# Patient Record
Sex: Female | Born: 1935 | Race: White | Hispanic: No | Smoking: Never smoker
Health system: Southern US, Community
[De-identification: ages and names within clinical notes are randomized; demographics above are authoritative.]

## PROBLEM LIST (undated history)

## (undated) DIAGNOSIS — S32009A Unspecified fracture of unspecified lumbar vertebra, initial encounter for closed fracture: Secondary | ICD-10-CM

## (undated) DIAGNOSIS — S32040A Wedge compression fracture of fourth lumbar vertebra, initial encounter for closed fracture: Secondary | ICD-10-CM

## (undated) DIAGNOSIS — E039 Hypothyroidism, unspecified: Secondary | ICD-10-CM

## (undated) DIAGNOSIS — F419 Anxiety disorder, unspecified: Secondary | ICD-10-CM

## (undated) DIAGNOSIS — C50919 Malignant neoplasm of unspecified site of unspecified female breast: Secondary | ICD-10-CM

## (undated) DIAGNOSIS — M797 Fibromyalgia: Secondary | ICD-10-CM

## (undated) DIAGNOSIS — I341 Nonrheumatic mitral (valve) prolapse: Secondary | ICD-10-CM

## (undated) DIAGNOSIS — E7800 Pure hypercholesterolemia, unspecified: Secondary | ICD-10-CM

## (undated) DIAGNOSIS — I340 Nonrheumatic mitral (valve) insufficiency: Secondary | ICD-10-CM

## (undated) DIAGNOSIS — S22009A Unspecified fracture of unspecified thoracic vertebra, initial encounter for closed fracture: Secondary | ICD-10-CM

## (undated) DIAGNOSIS — S2249XA Multiple fractures of ribs, unspecified side, initial encounter for closed fracture: Secondary | ICD-10-CM

## (undated) DIAGNOSIS — I5032 Chronic diastolic (congestive) heart failure: Secondary | ICD-10-CM

## (undated) DIAGNOSIS — I272 Pulmonary hypertension, unspecified: Secondary | ICD-10-CM

## (undated) DIAGNOSIS — Z86718 Personal history of other venous thrombosis and embolism: Secondary | ICD-10-CM

## (undated) DIAGNOSIS — I7121 Aneurysm of the ascending aorta, without rupture: Secondary | ICD-10-CM

## (undated) DIAGNOSIS — S2239XA Fracture of one rib, unspecified side, initial encounter for closed fracture: Secondary | ICD-10-CM

## (undated) DIAGNOSIS — Z8719 Personal history of other diseases of the digestive system: Secondary | ICD-10-CM

## (undated) DIAGNOSIS — Z923 Personal history of irradiation: Secondary | ICD-10-CM

## (undated) DIAGNOSIS — I82409 Acute embolism and thrombosis of unspecified deep veins of unspecified lower extremity: Secondary | ICD-10-CM

## (undated) DIAGNOSIS — I10 Essential (primary) hypertension: Secondary | ICD-10-CM

## (undated) DIAGNOSIS — K219 Gastro-esophageal reflux disease without esophagitis: Secondary | ICD-10-CM

## (undated) DIAGNOSIS — I071 Rheumatic tricuspid insufficiency: Secondary | ICD-10-CM

## (undated) DIAGNOSIS — I351 Nonrheumatic aortic (valve) insufficiency: Secondary | ICD-10-CM

## (undated) DIAGNOSIS — I480 Paroxysmal atrial fibrillation: Secondary | ICD-10-CM

## (undated) DIAGNOSIS — I712 Thoracic aortic aneurysm, without rupture: Secondary | ICD-10-CM

## (undated) HISTORY — DX: Nonrheumatic aortic (valve) insufficiency: I35.1

## (undated) HISTORY — DX: Thoracic aortic aneurysm, without rupture: I71.2

## (undated) HISTORY — DX: Paroxysmal atrial fibrillation: I48.0

## (undated) HISTORY — PX: BREAST SURGERY: SHX581

## (undated) HISTORY — DX: Nonrheumatic mitral (valve) prolapse: I34.1

## (undated) HISTORY — DX: Personal history of other venous thrombosis and embolism: Z86.718

## (undated) HISTORY — DX: Gastro-esophageal reflux disease without esophagitis: K21.9

## (undated) HISTORY — PX: EYE SURGERY: SHX253

## (undated) HISTORY — DX: Pulmonary hypertension, unspecified: I27.20

## (undated) HISTORY — DX: Nonrheumatic mitral (valve) insufficiency: I34.0

## (undated) HISTORY — DX: Aneurysm of the ascending aorta, without rupture: I71.21

## (undated) HISTORY — DX: Essential (primary) hypertension: I10

## (undated) HISTORY — DX: Malignant neoplasm of unspecified site of unspecified female breast: C50.919

## (undated) HISTORY — DX: Personal history of irradiation: Z92.3

## (undated) HISTORY — DX: Rheumatic tricuspid insufficiency: I07.1

## (undated) HISTORY — DX: Acute embolism and thrombosis of unspecified deep veins of unspecified lower extremity: I82.409

## (undated) HISTORY — DX: Unspecified fracture of unspecified thoracic vertebra, initial encounter for closed fracture: S22.009A

## (undated) HISTORY — DX: Fracture of one rib, unspecified side, initial encounter for closed fracture: S22.39XA

## (undated) HISTORY — DX: Chronic diastolic (congestive) heart failure: I50.32

## (undated) HISTORY — DX: Pure hypercholesterolemia, unspecified: E78.00

## (undated) HISTORY — DX: Unspecified fracture of unspecified lumbar vertebra, initial encounter for closed fracture: S32.009A

## (undated) HISTORY — DX: Multiple fractures of ribs, unspecified side, initial encounter for closed fracture: S22.49XA

---
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
## 1990-11-12 HISTORY — PX: CATARACT EXTRACTION: SUR2

## 1999-05-24 ENCOUNTER — Other Ambulatory Visit: Admission: RE | Admit: 1999-05-24 | Discharge: 1999-05-24 | Payer: Self-pay | Admitting: Gastroenterology

## 2000-01-25 ENCOUNTER — Emergency Department (HOSPITAL_COMMUNITY): Admission: EM | Admit: 2000-01-25 | Discharge: 2000-01-25 | Payer: Self-pay | Admitting: Emergency Medicine

## 2000-01-25 ENCOUNTER — Encounter: Payer: Self-pay | Admitting: Emergency Medicine

## 2001-07-02 ENCOUNTER — Encounter: Payer: Self-pay | Admitting: Orthopaedic Surgery

## 2001-07-02 ENCOUNTER — Encounter: Admission: RE | Admit: 2001-07-02 | Discharge: 2001-07-02 | Payer: Self-pay | Admitting: Orthopaedic Surgery

## 2001-07-31 ENCOUNTER — Encounter: Admission: RE | Admit: 2001-07-31 | Discharge: 2001-07-31 | Payer: Self-pay | Admitting: Urology

## 2001-07-31 ENCOUNTER — Encounter: Payer: Self-pay | Admitting: Urology

## 2001-10-17 ENCOUNTER — Ambulatory Visit (HOSPITAL_COMMUNITY): Admission: RE | Admit: 2001-10-17 | Discharge: 2001-10-17 | Payer: Self-pay | Admitting: Gastroenterology

## 2001-11-11 ENCOUNTER — Encounter: Admission: RE | Admit: 2001-11-11 | Discharge: 2001-11-11 | Payer: Self-pay | Admitting: Internal Medicine

## 2001-11-11 ENCOUNTER — Encounter: Payer: Self-pay | Admitting: Internal Medicine

## 2001-12-15 ENCOUNTER — Ambulatory Visit (HOSPITAL_COMMUNITY): Admission: RE | Admit: 2001-12-15 | Discharge: 2001-12-15 | Payer: Self-pay | Admitting: Gastroenterology

## 2003-09-09 ENCOUNTER — Other Ambulatory Visit: Admission: RE | Admit: 2003-09-09 | Discharge: 2003-09-09 | Payer: Self-pay | Admitting: Gastroenterology

## 2003-12-28 ENCOUNTER — Encounter: Admission: RE | Admit: 2003-12-28 | Discharge: 2003-12-28 | Payer: Self-pay | Admitting: Gastroenterology

## 2004-03-27 ENCOUNTER — Encounter: Admission: RE | Admit: 2004-03-27 | Discharge: 2004-03-27 | Payer: Self-pay | Admitting: Orthopaedic Surgery

## 2004-04-13 ENCOUNTER — Encounter: Admission: RE | Admit: 2004-04-13 | Discharge: 2004-04-13 | Payer: Self-pay | Admitting: Orthopaedic Surgery

## 2004-04-25 ENCOUNTER — Encounter: Admission: RE | Admit: 2004-04-25 | Discharge: 2004-04-25 | Payer: Self-pay | Admitting: Orthopaedic Surgery

## 2004-05-09 ENCOUNTER — Encounter: Admission: RE | Admit: 2004-05-09 | Discharge: 2004-05-09 | Payer: Self-pay | Admitting: Orthopaedic Surgery

## 2004-09-15 ENCOUNTER — Encounter: Admission: RE | Admit: 2004-09-15 | Discharge: 2004-09-15 | Payer: Self-pay | Admitting: Neurosurgery

## 2004-09-18 ENCOUNTER — Encounter: Admission: RE | Admit: 2004-09-18 | Discharge: 2004-09-18 | Payer: Self-pay | Admitting: Neurosurgery

## 2005-01-01 ENCOUNTER — Encounter: Admission: RE | Admit: 2005-01-01 | Discharge: 2005-01-01 | Payer: Self-pay | Admitting: Neurosurgery

## 2005-01-30 ENCOUNTER — Encounter: Admission: RE | Admit: 2005-01-30 | Discharge: 2005-01-30 | Payer: Self-pay | Admitting: Neurosurgery

## 2005-03-01 ENCOUNTER — Encounter: Admission: RE | Admit: 2005-03-01 | Discharge: 2005-03-01 | Payer: Self-pay | Admitting: Neurosurgery

## 2005-06-20 ENCOUNTER — Encounter: Admission: RE | Admit: 2005-06-20 | Discharge: 2005-06-20 | Payer: Self-pay | Admitting: Neurosurgery

## 2005-07-18 ENCOUNTER — Encounter: Admission: RE | Admit: 2005-07-18 | Discharge: 2005-07-18 | Payer: Self-pay | Admitting: Neurosurgery

## 2005-08-05 ENCOUNTER — Emergency Department (HOSPITAL_COMMUNITY): Admission: EM | Admit: 2005-08-05 | Discharge: 2005-08-05 | Payer: Self-pay | Admitting: *Deleted

## 2005-08-10 ENCOUNTER — Encounter: Admission: RE | Admit: 2005-08-10 | Discharge: 2005-08-10 | Payer: Self-pay | Admitting: Gastroenterology

## 2005-09-10 ENCOUNTER — Encounter: Admission: RE | Admit: 2005-09-10 | Discharge: 2005-09-10 | Payer: Self-pay | Admitting: Neurosurgery

## 2005-09-26 ENCOUNTER — Other Ambulatory Visit: Admission: RE | Admit: 2005-09-26 | Discharge: 2005-09-26 | Payer: Self-pay | Admitting: Gastroenterology

## 2005-10-08 ENCOUNTER — Encounter: Admission: RE | Admit: 2005-10-08 | Discharge: 2005-10-08 | Payer: Self-pay | Admitting: Neurosurgery

## 2005-12-13 ENCOUNTER — Encounter: Admission: RE | Admit: 2005-12-13 | Discharge: 2005-12-13 | Payer: Self-pay | Admitting: Neurosurgery

## 2006-02-11 ENCOUNTER — Encounter: Admission: RE | Admit: 2006-02-11 | Discharge: 2006-02-11 | Payer: Self-pay | Admitting: Neurosurgery

## 2006-03-05 ENCOUNTER — Encounter: Admission: RE | Admit: 2006-03-05 | Discharge: 2006-03-05 | Payer: Self-pay | Admitting: Neurosurgery

## 2006-10-16 ENCOUNTER — Ambulatory Visit (HOSPITAL_BASED_OUTPATIENT_CLINIC_OR_DEPARTMENT_OTHER): Admission: RE | Admit: 2006-10-16 | Discharge: 2006-10-16 | Payer: Self-pay | Admitting: Surgery

## 2006-10-16 HISTORY — PX: HERNIA REPAIR: SHX51

## 2007-10-27 ENCOUNTER — Other Ambulatory Visit: Admission: RE | Admit: 2007-10-27 | Discharge: 2007-10-27 | Payer: Self-pay | Admitting: Gastroenterology

## 2008-01-16 ENCOUNTER — Encounter: Admission: RE | Admit: 2008-01-16 | Discharge: 2008-01-16 | Payer: Self-pay | Admitting: Rheumatology

## 2010-01-16 ENCOUNTER — Encounter: Admission: RE | Admit: 2010-01-16 | Discharge: 2010-01-16 | Payer: Self-pay | Admitting: Internal Medicine

## 2010-12-01 ENCOUNTER — Inpatient Hospital Stay (HOSPITAL_COMMUNITY)
Admission: EM | Admit: 2010-12-01 | Discharge: 2010-12-02 | Payer: Self-pay | Source: Home / Self Care | Attending: Interventional Cardiology | Admitting: Interventional Cardiology

## 2010-12-03 ENCOUNTER — Encounter: Payer: Self-pay | Admitting: Rheumatology

## 2010-12-03 NOTE — Discharge Summary (Signed)
[name], [name] NO.:  [id number]  MEDICAL RECORD NO.:  [id number]          PATIENT TYPE:  INP  LOCATION:  2002                         FACILITY:  [facility]  PHYSICIAN:  Annette Bathe, MD      DATE OF BIRTH:  December 15, 1935  DATE OF ADMISSION:  12/01/2010 DATE OF DISCHARGE:  12/02/2010                              DISCHARGE SUMMARY   PRIMARY CARE PHYSICIAN:  Annette Arab. Valentina Lucks, MD  FINAL DIAGNOSES: 1. Chest pain - noncardiac - nuclear stress test performed was low     risk showing no evidence of ischemia with normal ejection fraction     63%. 2. Hypertension - blood pressure 134/90 down to 194/60 in the middle     of night - she was on metoprolol 25 q.6 during this hospitalization     and this has been discontinued.  Her a.m. EKG also showed resting     bradycardia of 45.  She does not need beta-blocker therapy. 3. Hyperlipidemia - continue with Pravachol as previously prescribed. 4. Hypothyroidism, on Synthroid. 5. Osteoporosis. 6. Gastroesophageal reflux disease - on Prilosec 40 mg tablet every     morning at home - one may wish to intensify this with Protonix or     other agent. 7. Fibromyalgia. 8. Remote history of deep vein thrombosis.  ALLERGIES:  IV DYE cause shortness of breath; however, the patient is able to tolerate IV contrast when she is premedicated.  PENICILLIN.  MEDICATIONS AT HOME:  She is taking: 1. Norvasc 10 mg a day. 2. Synthroid 100 mcg a day. 3. Prilosec over the counter 40 mg a day. 4. Pravachol 40 mg a day. 5. Enablex 15 mg a day. 6. Amitriptyline 50 mg at bedtime. 7. Vicodin p.r.n. 8. Multivitamins. 9. Citrucel plus D. 10.Boniva 150 mg monthly. 11.I have added aspirin 81 mg to her regimen.  BRIEF HOSPITAL COURSE:  A 75 year old female that was admitted under Dr. Michaelle [name] with chest discomfort for 3-4 weeks history of episodic chest pain randomly predominantly at rest when she is lying down in bed.  Dr. Maurice [name] felt as  though this was consistent with reflux, started her on low-dose proton pump inhibitor.  Symptoms improved, but then she went back, and her proton pump inhibitor was titrated.  EKG was normal. On the morning of discharge, she did have resting bradycardia, rate 45, however, this was with concomitant use of metoprolol.  On admit, she did have a resting bradycardia of 55 with no ST segment changes.  She was taken down to the nuclear stress lab for stress test, and this was low risk showing no evidence of ischemia with normal EF.  This was a Lexiscan stress.  On the day of discharge, she was feeling well.  No complaints.  No shortness of breath, ambulating well.  Blood pressures as noted above.  Afebrile.  NOTABLE LABORATORY DATA:  Lipid profile, total cholesterol 149, triglycerides 54, HDL 58, LDL 80.  Sodium 140, potassium 3.6, BUN 24, creatinine 1.1, hemoglobin A1c 5.4.  TSH was normal at 1.6.  Cardiac biomarkers were all normal.  FOLLOWUP:  She does not require  Cardiology followup.  I have instructed her to make an appointment once again with Dr. Maurice [name].  We will continue for treatment with GERD, possibly intensifying her proton pump inhibitor to Protonix or a new agent, but I will leave this at the discretion of Dr. Valentina [name].  Both she and her husband were satisfied with this, and we discussed her hospital course and plan as well as medication strategy.  35 minutes was spent on dictation, med reconciliation, review of medications, discussion with the patient.     Annette Bathe, MD     MCS/MEDQ  D:  12/02/2010  T:  12/03/2010  Job:  [id number]  Electronically Signed by Donato Schultz MD on 12/03/2010 09:47:32 PM

## 2010-12-04 LAB — POCT I-STAT, CHEM 8
Calcium, Ion: 1.17 mmol/L (ref 1.12–1.32)
Chloride: 106 mEq/L (ref 96–112)
HCT: 43 % (ref 36.0–46.0)

## 2010-12-04 LAB — CBC
MCV: 83.7 fL (ref 78.0–100.0)
Platelets: 173 10*3/uL (ref 150–400)
RDW: 13.9 % (ref 11.5–15.5)
WBC: 6.5 10*3/uL (ref 4.0–10.5)

## 2010-12-04 LAB — COMPREHENSIVE METABOLIC PANEL
ALT: 20 U/L (ref 0–35)
AST: 24 U/L (ref 0–37)
Alkaline Phosphatase: 52 U/L (ref 39–117)
CO2: 27 mEq/L (ref 19–32)
Chloride: 108 mEq/L (ref 96–112)
Creatinine, Ser: 0.74 mg/dL (ref 0.4–1.2)
GFR calc Af Amer: >60 mL/min (ref >60)
GFR calc non Af Amer: >60 mL/min (ref >60)
Potassium: 3.2 mEq/L — ABNORMAL LOW (ref 3.5–5.1)
Sodium: 140 mEq/L (ref 135–145)
Total Bilirubin: 0.6 mg/dL (ref 0.3–1.2)

## 2010-12-04 LAB — CK TOTAL AND CKMB (NOT AT ARMC)
CK, MB: 4.2 ng/mL — ABNORMAL HIGH (ref 0.3–4.0)
Relative Index: INVALID (ref 0.0–2.5)
Total CK: 96 U/L (ref 7–177)

## 2010-12-04 LAB — HEMOGLOBIN A1C
Hgb A1c MFr Bld: 5.4 % (ref <5.7)
Mean Plasma Glucose: 108 mg/dL (ref <117)

## 2010-12-04 LAB — TROPONIN I: Troponin I: 0.03 ng/mL (ref 0.00–0.06)

## 2010-12-04 LAB — CARDIAC PANEL(CRET KIN+CKTOT+MB+TROPI)
CK, MB: 4 ng/mL (ref 0.3–4.0)
Relative Index: 3.8 — ABNORMAL HIGH (ref 0.0–2.5)
Relative Index: 3.1 — ABNORMAL HIGH (ref 0.0–2.5)
Total CK: 105 U/L (ref 7–177)
Troponin I: 0.02 ng/mL (ref 0.00–0.06)

## 2010-12-04 LAB — POCT CARDIAC MARKERS: Troponin i, poc: <0.05 ng/mL (ref 0.00–0.09)

## 2010-12-05 LAB — BASIC METABOLIC PANEL
BUN: 24 mg/dL — ABNORMAL HIGH (ref 6–23)
Chloride: 106 mEq/L (ref 96–112)
GFR calc non Af Amer: 45 mL/min — ABNORMAL LOW (ref >60)
Potassium: 3.6 mEq/L (ref 3.5–5.1)
Sodium: 140 mEq/L (ref 135–145)

## 2010-12-05 LAB — LIPID PANEL
Cholesterol: 149 mg/dL (ref 0–200)
HDL: 58 mg/dL (ref >39)
Total CHOL/HDL Ratio: 2.6 RATIO
Triglycerides: 54 mg/dL (ref <150)

## 2010-12-05 LAB — CBC
Hemoglobin: 12.8 g/dL (ref 12.0–15.0)
MCH: 27.9 pg (ref 26.0–34.0)
MCV: 82.1 fL (ref 78.0–100.0)
RBC: 4.59 MIL/uL (ref 3.87–5.11)
WBC: 8.7 10*3/uL (ref 4.0–10.5)

## 2010-12-05 LAB — HEPARIN LEVEL (UNFRACTIONATED): Heparin Unfractionated: 0.45 IU/mL (ref 0.30–0.70)

## 2010-12-07 NOTE — H&P (Addendum)
NAMEMarland Kitchen  [name], [name] NO.:  [id number]  MEDICAL RECORD NO.:  [id number]          PATIENT TYPE:  EMS  LOCATION:  MAJO                         FACILITY:  [facility]  PHYSICIAN:  Therisa Doyne, MD    DATE OF BIRTH:  Jul 19, 1936  DATE OF ADMISSION:  12/01/2010 DATE OF DISCHARGE:                             HISTORY & PHYSICAL   PRIMARY CARE PROVIDER:  Gretta Arab. Valentina Lucks, M.D.  PRIMARY CARDIOLOGIST:  None, however, patient has had an echocardiogram performed by Dr. Verdis Prime.  CHIEF COMPLAINT:  Chest pain.  HISTORY OF PRESENT ILLNESS:  This is a 75 year old white female with past medical history significant for hypertension and hyperlipidemia who presents for evaluation of chest pain.  Historically, the patient reports a 3- to 4-week history of episodic chest pain that occurs at random times and predominantly occurs at rest when she is lying down in bed.  She initially saw her primary care provider who felt that this was consistent with reflux and started her on low-dose proton pump inhibitor.  Her symptoms then improved, and so she went back and her proton pump inhibitor was titrated.  She had an EKG performed which was reportedly abnormal; however, I do not have access to this EKG, and she was sent for an echocardiogram.  This echocardiogram was performed by Dr. Michaelle Copas office last Monday and the results are pending.  Since being placed on the increased dose of proton pump inhibitor, she has done well with minimal chest pain until this evening when she developed 30 minutes of substernal chest pressure and tightness.  This radiated to her bilateral arms, her neck and her jaw.  It lasted 30 minutes, and she took two of her husband's nitroglycerin and this relieved the pain.  She is fairly active since she is doing activities of daily living as well as shopping without any significant exertional chest pain or shortness of breath; however, she does tell me that  she tires out more easily.  She denies any tachy-palpitations or syncope. She denies any frank heart failure symptoms.  In the emergency department, patient received IV heparin and aspirin.  PAST MEDICAL HISTORY: 1. Hypertension. 2. Hyperlipidemia. 3. Hypothyroidism. 4. Gastroesophageal reflux disease. 5. Fibromyalgia. 6. Remote history of deep vein thrombosis.  SOCIAL HISTORY:  The patient lives at home with her husband.  She denies any tobacco, alcohol or drug use.  FAMILY HISTORY:  Negative for premature coronary artery disease.  ALLERGIES: 1. IV DYE which causes shortness of breath; however, the patient is     able to tolerate IV contrast dye when she is premedicated. 2. PENICILLIN.  MEDICATIONS: 1. Norvasc 10 mg daily. 2. Synthroid 100 mcg daily. 3. Prilosec OTC 40 mg daily. 4. Pravachol 40 mg daily. 5. Enablex 15 mg daily. 6. Amitriptyline 50 mg at bedtime.  REVIEW OF SYSTEMS:  All systems were reviewed and are negative except as mentioned above in history of present illness.  PHYSICAL EXAMINATION:  VITAL SIGNS:  Temperature afebrile, blood pressure is 144/96, pulse 95, respirations 18, oxygen saturation 100% on room air. GENERAL:  No acute distress. HEENT:  Normocephalic, atraumatic.  Pupils  equally round and reactive to light and accommodation.  Extraocular movements are intact.  Oropharynx is pink and moist without lesions. NECK:  Supple without lymphadenopathy.  No jugular venous distention. No masses. CARDIOVASCULAR:  Regular rate and rhythm with no murmurs, rubs, or gallops. CHEST:  Clear to auscultation bilaterally. ABDOMEN:  Positive bowel sounds, soft, nontender, and nondistended. EXTREMITIES:  No clubbing, cyanosis, or edema.  Dorsalis pedis pulses are 2+ bilaterally. SKIN:  No rashes. BACK:  No CVA tenderness. NEUROLOGIC:  Nonfocal.  LABORATORY DATA:  Notable for negative troponins, less than 0.005.  CBC is in normal limits.  CMP shows normal  liver function studies, normal renal function studies, hypokalemia with a potassium of 3.2.  Chest x-ray:  No acute cardiopulmonary disease.  EKG shows sinus rhythm with premature ventricular contractions, evidence of a prior anterior septal myocardial infarction age indeterminate, left anterior fascicular block, and no acute ST- or T-wave changes.  No ST elevation, T-wave inversions, or ST depressions.  ASSESSMENT/PLAN:  [name] is a 75 year old white female with a past medical history significant for hypertension and hyperlipidemia who presents for progressive chest pain over the past few weeks with negative cardiac biomarkers. 1. We will admit the patient to [facility], Dr. Michaelle Copas care. 2. Chest pain at this time.  I think that her presentation is somewhat     atypical.  It is unusual that her chest pain predominantly occurs     at rest and does not occur with exertion.  Her EKG did not show any     acute ischemia.  Her first set of cardiac enzymes are negative.     While this could be consistent with unstable angina, I think it is     more likely gastroesophageal reflux disease versus musculoskeletal     pains.  We will treat the patient with aspirin, Lopressor, and     Lipitor. We will start her on IV heparin drip. We will rule out for     myocardial infarction with serial cardiac enzymes, if she rules     out, then I think she would be a reasonable candidate for a stress     test.  I had a long discussion with her regarding the pros and cons     of stress testing versus cardiac catheterization.  At this time,     she elected to go a noninvasive route if possible.  We will     continue to treat her with proton pump inhibitor. 3. Hypertension, under reasonable control.  Continue home medications. 4. Hyperlipidemia.  Continue Pravachol, check fasting lipid profile. 5. Hypothyroidism.  Continue Synthroid, check TSH. 6. Fluids, electrolytes, nutrition.  Saline lock IV fluids.   The     patient is hypokalemic.  We will give her 40 mEq of oral potassium. 7. Deep vein thrombosis prophylaxis is not indicated as the patient is     on a heparin drip.     Therisa Doyne, MD     SJT/MEDQ  D:  12/01/2010  T:  12/01/2010  Job:  [id number]  Electronically Signed by Aldona Bar MD on 12/07/2010 08:35:56 PM

## 2011-03-30 NOTE — Op Note (Signed)
NAMEANAVEY, COOMBES             ACCOUNT NO.:  [id number]   MEDICAL RECORD NO.:  [id number]          PATIENT TYPE:  AMB   LOCATION:  DSC                          FACILITY:  [facility]   PHYSICIAN:  Thornton Park. Daphine Deutscher, MD  DATE OF BIRTH:  1936-02-28   DATE OF PROCEDURE:  10/16/2006  DATE OF DISCHARGE:                               OPERATIVE REPORT   [facility] [phone]   PREOPERATIVE DIAGNOSIS:  Right inguinal hernia.   POSTOPERATIVE DIAGNOSIS:  Right indirect inguinal hernia.   PROCEDURE:  Inversion of sac, transection of the gubernaculum, closure  of the internal ring and repair of the floor with a piece of mesh.   SURGEON:  Luretha Murphy, MD   ANESTHESIA:  General by LMA.   DESCRIPTION OF PROCEDURE:  This 75 year old lady was taken to room 4 at  [facility] and given general anesthesia by LMA.  The abdomen was  prepped with Techni-Care and draped sterilely.  Made a small oblique  incision in the right inguinal region which we had previously marked and  identified, carried this down and found the fat protruding through the  external ring.  I opened the external ring and mobilized this up to the  internal ring which I then inverted the long sac and I closed that with  a running suture of 2-0 Prolene.  Gubernaculum was adjacent to that  and  I went ahead and dissected and divided that and oversewed it with 2-0  silk on either ends.  I then repaired the floor trying to exclude the  nerves with interrupted 2-0 Prolene suture at the inguinal ligament and  to transversalis fascia and up beyond the internal ring.  This was then  tucked beneath, the external oblique was then closed over with a running  2-0 Vicryl.  4-0 Vicryl used in the subcutaneous tissue as well as  interrupted 4-0 and 5-0 Vicryl with Benzoin Steri-Strips.  The patient  seemed tolerate the procedure well and was taken to recovery room in  satisfactory condition.      Thornton Park Daphine Deutscher, MD  Electronically  Signed     MBM/MEDQ  D:  10/16/2006  T:  10/16/2006  Job:  [id number]   cc:   Tasia Catchings, M.D.

## 2011-03-30 NOTE — Op Note (Signed)
NAMEPHUNG, KOTAS             ACCOUNT NO.:  [id number]   MEDICAL RECORD NO.:  [id number]          PATIENT TYPE:  AMB   LOCATION:  DSC                          FACILITY:  [facility]   PHYSICIAN:  Thornton Park. Daphine Deutscher, MD  DATE OF BIRTH:  [birth year]   DATE OF PROCEDURE:  10/16/2006  DATE OF DISCHARGE:  10/16/2006                               OPERATIVE REPORT   PREOPERATIVE DIAGNOSIS:  Right inguinal hernia.   POSTOPERATIVE DIAGNOSIS:  Right inguinal hernia indirect.   DESCRIPTION OF PROCEDURE:  The patient was taken to OR [facility]  and under general with LMA, the right inguinal region was prepped with  Betadine and draped sterilely.  The oblique incision was made and  indirect hernia found.  The sac was amputated and the floor closed and  mesh was applied on top of this.  This was a type of Atrium mesh.  The  wound was then closed in layers.  The patient tolerated procedure well  and was taken to recovery room satisfactory condition.      Thornton Park Daphine Deutscher, MD  Electronically Signed     MBM/MEDQ  D:  11/19/2006  T:  11/20/2006  Job:  [id number]

## 2011-06-07 ENCOUNTER — Other Ambulatory Visit: Payer: Self-pay | Admitting: Internal Medicine

## 2011-06-07 DIAGNOSIS — R1012 Left upper quadrant pain: Secondary | ICD-10-CM

## 2011-06-08 ENCOUNTER — Ambulatory Visit (HOSPITAL_COMMUNITY): Payer: Self-pay

## 2011-06-08 ENCOUNTER — Ambulatory Visit (HOSPITAL_COMMUNITY)
Admission: RE | Admit: 2011-06-08 | Discharge: 2011-06-08 | Disposition: A | Discharge status: Home / Self Care | Payer: Medicare Other | Source: Ambulatory Visit | Attending: Internal Medicine | Admitting: Internal Medicine

## 2011-06-08 ENCOUNTER — Other Ambulatory Visit (HOSPITAL_COMMUNITY): Payer: Self-pay | Admitting: Internal Medicine

## 2011-06-08 ENCOUNTER — Other Ambulatory Visit: Payer: Self-pay

## 2011-06-08 DIAGNOSIS — R1012 Left upper quadrant pain: Secondary | ICD-10-CM

## 2011-06-12 ENCOUNTER — Ambulatory Visit
Admission: RE | Admit: 2011-06-12 | Discharge: 2011-06-12 | Disposition: A | Discharge status: Home / Self Care | Payer: BC Managed Care – PPO | Source: Ambulatory Visit | Attending: Internal Medicine | Admitting: Internal Medicine

## 2011-06-12 ENCOUNTER — Other Ambulatory Visit: Payer: Self-pay | Admitting: Internal Medicine

## 2011-06-12 DIAGNOSIS — M545 Low back pain: Secondary | ICD-10-CM

## 2011-06-13 ENCOUNTER — Other Ambulatory Visit: Payer: Self-pay | Admitting: Internal Medicine

## 2011-06-13 ENCOUNTER — Ambulatory Visit
Admission: RE | Admit: 2011-06-13 | Discharge: 2011-06-13 | Disposition: A | Discharge status: Home / Self Care | Payer: BC Managed Care – PPO | Source: Ambulatory Visit | Attending: Internal Medicine | Admitting: Internal Medicine

## 2011-06-13 DIAGNOSIS — M545 Low back pain: Secondary | ICD-10-CM

## 2011-06-27 ENCOUNTER — Emergency Department (HOSPITAL_COMMUNITY)
Admission: EM | Admit: 2011-06-27 | Discharge: 2011-06-27 | Disposition: A | Discharge status: Home / Self Care | Payer: Medicare Other | Attending: Emergency Medicine | Admitting: Emergency Medicine

## 2011-06-27 ENCOUNTER — Encounter (INDEPENDENT_AMBULATORY_CARE_PROVIDER_SITE_OTHER): Payer: Medicare Other

## 2011-06-27 DIAGNOSIS — E876 Hypokalemia: Secondary | ICD-10-CM | POA: Insufficient documentation

## 2011-06-27 DIAGNOSIS — Z87898 Personal history of other specified conditions: Secondary | ICD-10-CM | POA: Insufficient documentation

## 2011-06-27 DIAGNOSIS — I824Z9 Acute embolism and thrombosis of unspecified deep veins of unspecified distal lower extremity: Secondary | ICD-10-CM | POA: Insufficient documentation

## 2011-06-27 DIAGNOSIS — M7989 Other specified soft tissue disorders: Secondary | ICD-10-CM

## 2011-06-27 DIAGNOSIS — M79609 Pain in unspecified limb: Secondary | ICD-10-CM | POA: Insufficient documentation

## 2011-06-27 DIAGNOSIS — Z79899 Other long term (current) drug therapy: Secondary | ICD-10-CM | POA: Insufficient documentation

## 2011-06-27 DIAGNOSIS — H05409 Unspecified enophthalmos, unspecified eye: Secondary | ICD-10-CM | POA: Insufficient documentation

## 2011-06-27 DIAGNOSIS — I10 Essential (primary) hypertension: Secondary | ICD-10-CM | POA: Insufficient documentation

## 2011-06-27 DIAGNOSIS — E039 Hypothyroidism, unspecified: Secondary | ICD-10-CM | POA: Insufficient documentation

## 2011-06-27 LAB — CBC
MCH: 29.4 pg (ref 26.0–34.0)
MCHC: 35.5 g/dL (ref 30.0–36.0)
MCV: 82.7 fL (ref 78.0–100.0)
Platelets: 214 10*3/uL (ref 150–400)

## 2011-06-27 LAB — POCT I-STAT, CHEM 8
Calcium, Ion: 1.19 mmol/L (ref 1.12–1.32)
Hemoglobin: 13.6 g/dL (ref 12.0–15.0)
Sodium: 140 mEq/L (ref 135–145)
TCO2: 29 mmol/L (ref 0–100)

## 2011-06-27 LAB — PROTIME-INR
INR: 1.03 (ref 0.00–1.49)
Prothrombin Time: 13.7 seconds (ref 11.6–15.2)

## 2011-07-06 NOTE — Procedures (Unsigned)
DUPLEX DEEP VENOUS EXAM - LOWER EXTREMITY  INDICATION:  Left lower extremity swelling and redness for 1 day.  HISTORY:  Edema:  Yes. Trauma/Surgery:  Back fracture. Pain:  No. PE:  No. Previous DVT:  Left leg in 1995. Anticoagulants:  Not currently. Other:  DUPLEX EXAM:               CFV   SFV   PopV  PTV    GSV               R  L  R  L  R  L  R   L  R  L Thrombosis    o  o     o     +      +     o Spontaneous   +  +     +     o      o     + Phasic        +  +     +     o      o     + Augmentation  +  +     +     o      o     + Compressible  +  +     +     o      o     + Competent     +  o     +                  o  Legend:  + - yes  o - no  p - partial  D - decreased  IMPRESSION: 1. Acute mobile deep venous thrombosis observed in the left popliteal     vein.  One of the paired posterior tibial veins appears occluded. 2. There is reflux observed throughout the left lower extremity.  Verbal results were given to Dr. Venetia Maxon at 3:50 p.m.  Patient was instructed to go to the [facility] ER.   _____________________________ Quita Skye. Hart Rochester, M.D.  LT/MEDQ  D:  06/27/2011  T:  06/27/2011  Job:  [id number]

## 2011-08-13 DIAGNOSIS — I82409 Acute embolism and thrombosis of unspecified deep veins of unspecified lower extremity: Secondary | ICD-10-CM

## 2011-08-13 HISTORY — DX: Acute embolism and thrombosis of unspecified deep veins of unspecified lower extremity: I82.409

## 2011-08-20 ENCOUNTER — Other Ambulatory Visit: Payer: Self-pay | Admitting: Radiology

## 2011-08-20 DIAGNOSIS — C50919 Malignant neoplasm of unspecified site of unspecified female breast: Secondary | ICD-10-CM | POA: Insufficient documentation

## 2011-08-20 HISTORY — DX: Malignant neoplasm of unspecified site of unspecified female breast: C50.919

## 2011-08-22 ENCOUNTER — Other Ambulatory Visit: Payer: Self-pay | Admitting: Radiology

## 2011-08-22 DIAGNOSIS — C50911 Malignant neoplasm of unspecified site of right female breast: Secondary | ICD-10-CM

## 2011-08-23 ENCOUNTER — Other Ambulatory Visit: Payer: Self-pay | Admitting: Diagnostic Radiology

## 2011-08-23 MED ORDER — PREDNISONE 50 MG PO TABS: 50.0000 mg | ORAL_TABLET | ORAL | Status: DC

## 2011-08-27 ENCOUNTER — Ambulatory Visit
Admission: RE | Admit: 2011-08-27 | Discharge: 2011-08-27 | Disposition: A | Discharge status: Home / Self Care | Payer: Medicare Other | Source: Ambulatory Visit | Attending: Radiology | Admitting: Radiology

## 2011-08-27 DIAGNOSIS — C50911 Malignant neoplasm of unspecified site of right female breast: Secondary | ICD-10-CM

## 2011-08-27 MED ORDER — GADOBENATE DIMEGLUMINE 529 MG/ML IV SOLN
12.0000 mL | Freq: Once | INTRAVENOUS | Status: AC | PRN
  Administered 2011-08-27: 12 mL via INTRAVENOUS

## 2011-08-27 NOTE — Progress Notes (Signed)
Patient states she has been pre-medicated for contrast allergy despite this being an MRI.  jkl

## 2011-09-07 ENCOUNTER — Encounter (INDEPENDENT_AMBULATORY_CARE_PROVIDER_SITE_OTHER): Payer: Self-pay | Admitting: Surgery

## 2011-09-07 ENCOUNTER — Ambulatory Visit (INDEPENDENT_AMBULATORY_CARE_PROVIDER_SITE_OTHER): Payer: Medicare Other | Admitting: Surgery

## 2011-09-07 VITALS — BP 134/86 | HR 72 | Temp 98.5°F | Resp 16 | Ht 60.0 in | Wt 123.0 lb

## 2011-09-07 DIAGNOSIS — I712 Thoracic aortic aneurysm, without rupture, unspecified: Secondary | ICD-10-CM

## 2011-09-07 DIAGNOSIS — C50919 Malignant neoplasm of unspecified site of unspecified female breast: Secondary | ICD-10-CM

## 2011-09-07 DIAGNOSIS — I82409 Acute embolism and thrombosis of unspecified deep veins of unspecified lower extremity: Secondary | ICD-10-CM

## 2011-09-07 NOTE — Patient Instructions (Signed)
We will schedule surgery when we have heard back from Dr Valentina Lucks. You will need to stay in the hospital overnight.

## 2011-09-07 NOTE — Progress Notes (Addendum)
Chief Complaint  Patient presents with  . breast cancer    HPI [name] is a 75 y.o. female.  She recently had a mammogram which showed a 1 cm area of calcifications are moderately suspicious. 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 There was inadequate tissue for hormone receptor tests. A mammogram has shown only a single area, but it appears about 4 cm of abnormality.  Other than a few this, the patient has never had any prior breast problems. She was asymptomatic and had not noted any thing at the time of her mammogram she has a negative family history for breast cancer.After she left the office we found our old chart on her with a paper record. Dating back to 1982 she has been followed intermittently in our office for fibrocystic changes. I have asked she seen her for this over several years and she been seen by one of my partners prior to that. She was last seen for that here in 2007. HPI  Past Medical History  Diagnosis Date  . GERD (gastroesophageal reflux disease)   . Hypercholesteremia   . Hypertension   . Osteoporosis   . Thyroid disease     Past Surgical History  Procedure Date  . Hernia repair 10/16/2006    [facility] - Dr Daphine Deutscher  . Appendectomy 1940  . Tonsillectomy 1944  . Eye surgery 1940, 1956  . Cataract extraction 1992    No family history on file.  Social History History  Substance Use Topics  . Smoking status: Never Smoker   . Smokeless tobacco: Not on file  . Alcohol Use: No    Allergies  Allergen Reactions  . Contrast Media (Iodinated Diagnostic Agents) Shortness Of Breath  . Iohexol Shortness Of Breath  . Penicillins Rash    Current Outpatient Prescriptions  Medication Sig Dispense Refill  . amitriptyline (ELAVIL) 50 MG tablet       . amLODipine (NORVASC) 10 MG tablet       . BONIVA 150 MG tablet       . calcium citrate-vitamin D (CITRACAL+D) 315-200 MG-UNIT per tablet Take 1 tablet by mouth 2 (two) times daily.          . cholecalciferol (VITAMIN D) 400 UNITS TABS Take 1,000 Units by mouth daily.        . ENABLEX 15 MG 24 hr tablet       . HYDROcodone-acetaminophen (VICODIN) 5-500 MG per tablet       . Multiple Vitamin (MULTIVITAMIN) tablet Take 1 tablet by mouth daily.        . pravastatin (PRAVACHOL) 20 MG tablet       . PRILOSEC OTC 20 MG tablet       . SYNTHROID 100 MCG tablet       . warfarin (COUMADIN) 2.5 MG tablet         Review of Systems Review of Systems  Constitutional: Negative for fever, chills and unexpected weight change.  HENT: Negative for hearing loss, congestion, sore throat, trouble swallowing and voice change.   Eyes: Negative for visual disturbance.  Respiratory: Negative for cough and wheezing.   Cardiovascular: Positive for leg swelling. Negative for chest pain and palpitations.  Gastrointestinal: Positive for constipation. Negative for nausea, vomiting, abdominal pain, diarrhea, blood in stool, abdominal distention and anal bleeding.  Genitourinary: Negative for hematuria, vaginal bleeding and difficulty urinating.  Musculoskeletal: Negative for arthralgias.  Skin: Negative for rash and wound.  Neurological: Negative for  seizures, syncope and headaches.  Hematological: Negative for adenopathy. Bruises/bleeds easily.  Psychiatric/Behavioral: Negative for confusion.    Blood pressure 134/86, pulse 72, temperature 98.5 F (36.9 C), temperature source Temporal, resp. rate 16, height 5' (1.524 m), weight 123 lb (55.792 kg).  Physical Exam Physical Exam  Constitutional: She is oriented to person, place, and time. She appears well-developed and well-nourished. No distress.  HENT:  Head: Normocephalic.  Eyes: Pupils are equal, round, and reactive to light. Left eye exhibits no discharge. No scleral icterus.       Left ey drifts  Neck: Normal range of motion. No tracheal deviation present. No thyromegaly present.  Cardiovascular: Normal rate, regular rhythm and intact distal  pulses.  Exam reveals no gallop and no friction rub.   Murmur heard. Pulmonary/Chest: Effort normal and breath sounds normal. No stridor. No respiratory distress. She has no wheezes. She has no rales.  Abdominal: Soft. She exhibits no distension and no mass. There is no tenderness. There is no rebound.  Musculoskeletal: She exhibits edema.       Mild LLE edema  Neurological: She is alert and oriented to person, place, and time.  Skin: Skin is warm and dry. She is not diaphoretic.  Psychiatric: She has a normal mood and affect. Her behavior is normal. Judgment and thought content normal.    Data Reviewed I have reviewed the mammogram films and reports to MRI films and report the pathology slides it reports as discussed with the breast multidisciplinary conference.  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 4.7 cm ascending aortic aneurysm 3. Recent left lower extremity DVT currently on Coumadin. 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 The area on the mammogram appears only about 1 cm. She does have high-grade disease and this is in the upper outer quadrant. She also be a candidate for a mastectomy. However I think this can be treated with a lumpectomy.  I spoke with her primary care physician and he recommends that she had a Coumadin/Lovenox bridge perioperatively. He is also going to arrange a heart surgical consultation for her aneurysm.  Once this is available we can schedule surgery. I have discussed the indications for the lumpectomy and described the procedure. She understands that the chance of removal of the abnormal area is very good, but that occasionally we are unable to locate it and may have to do a second procedure. We also discussed the possibility of a second procedure to get additional tissue. Risks of surgery such as bleeding and infection have also been  explained, as well as the implications of not doing the surgery. She understands and wishes to proceed.        Farra Nikolic J 09/07/2011, 9:54 AM

## 2011-09-12 ENCOUNTER — Encounter (INDEPENDENT_AMBULATORY_CARE_PROVIDER_SITE_OTHER): Payer: Self-pay | Admitting: Surgery

## 2011-09-12 ENCOUNTER — Encounter (INDEPENDENT_AMBULATORY_CARE_PROVIDER_SITE_OTHER): Payer: Self-pay

## 2011-09-26 ENCOUNTER — Institutional Professional Consult (permissible substitution) (INDEPENDENT_AMBULATORY_CARE_PROVIDER_SITE_OTHER): Payer: Medicare Other | Admitting: Cardiothoracic Surgery

## 2011-09-26 ENCOUNTER — Encounter: Payer: Self-pay | Admitting: Cardiothoracic Surgery

## 2011-09-26 VITALS — BP 121/83 | HR 100 | Resp 16 | Ht 60.0 in | Wt 123.0 lb

## 2011-09-26 DIAGNOSIS — C50911 Malignant neoplasm of unspecified site of right female breast: Secondary | ICD-10-CM

## 2011-09-26 DIAGNOSIS — C50919 Malignant neoplasm of unspecified site of unspecified female breast: Secondary | ICD-10-CM

## 2011-09-26 DIAGNOSIS — Z86718 Personal history of other venous thrombosis and embolism: Secondary | ICD-10-CM | POA: Insufficient documentation

## 2011-09-26 DIAGNOSIS — I712 Thoracic aortic aneurysm, without rupture: Secondary | ICD-10-CM

## 2011-09-26 DIAGNOSIS — H269 Unspecified cataract: Secondary | ICD-10-CM | POA: Insufficient documentation

## 2011-09-26 NOTE — Patient Instructions (Signed)
Start toprol XL 25 mg daily

## 2011-09-26 NOTE — Progress Notes (Signed)
PCP is Lillia Mountain, MD, MD Referring Provider is Lillia Mountain, MD                         [address] [place].[address]            [place] [id number]          [phone]   a the patient  Chief Complaint  Patient presents with  . Thoracic Aortic Aneurysm    eval and treat    HPI: The patient is referred for evaluation of a 4.7 cm fusiform ascending thoracic aneurysm noted on MRI of the breast done for evaluation of right breast cancer. This is asymptomatic. The patient has no clear history of cardiac disease other than mitral valve prolapse and a 2-D echo performed January 2012 showing moderate mitral regurgitation, mild aortic insufficiency and normal LV function with ejection fraction 60%. The patient also had a normal perfusion stress test in early 2012 which was performed for some atypical neck pain. She does have history of severe arthritis of her back and spine and is been followed by Dr. Maeola Harman in neurosurgery. The patient was recently evaluated for a tumor in the right breast which has been clinically diagnosed as carcinoma. Prior to being treated by Dr. Jamey Ripa with lumpectomy and radiation therapy a thoracic surgical evaluation of her thoracic aorta was requested.  There's no family history of thoracic or abdominal l aortic aneurysm disease. There's no family history of aortic dissection or sudden death. The patient has risk factors including hypertension negative for smoking or dyslipidemia.   Past Medical History  Diagnosis Date  . GERD (gastroesophageal reflux disease)   . Hypercholesteremia   . Hypertension   . Osteoporosis   . Thyroid disease   . Rib fractures   . Fracture lumbar vertebra-closed   . Fracture of thoracic vertebra, closed   . Cancer 10/12    bx/right breast  . History of blood clots   . Cataract 3 and 10/92    bilateral    Past Surgical History  Procedure Date  . Hernia repair 10/16/2006    [facility] - Dr Daphine Deutscher  . Appendectomy 1940    . Tonsillectomy 1944  . Cataract extraction 1992  . Eye surgery 1940, 1956    No family history on file. negative for heart disease heart valve disease or aneurysm disease  Social History History  Substance Use Topics  . Smoking status: Never Smoker   . Smokeless tobacco: Not on file  . Alcohol Use: No    Current Outpatient Prescriptions  Medication Sig Dispense Refill  . amitriptyline (ELAVIL) 50 MG tablet 50 mg at bedtime.       Marland Kitchen amLODipine (NORVASC) 10 MG tablet 10 mg.       . BONIVA 150 MG tablet       . calcium citrate-vitamin D (CITRACAL+D) 315-200 MG-UNIT per tablet Take 1 tablet by mouth 2 (two) times daily.        . cholecalciferol (VITAMIN D) 400 UNITS TABS Take 1,000 Units by mouth daily.        . ENABLEX 15 MG 24 hr tablet       . HYDROcodone-acetaminophen (VICODIN) 5-500 MG per tablet every 8 (eight) hours as needed.       . Multiple Vitamin (MULTIVITAMIN) tablet Take 1 tablet by mouth daily.        . pravastatin (PRAVACHOL) 20 MG tablet       .  PRILOSEC OTC 20 MG tablet       . SYNTHROID 100 MCG tablet       . warfarin (COUMADIN) 2.5 MG tablet         Allergies  Allergen Reactions  . Contrast Media (Iodinated Diagnostic Agents) Shortness Of Breath  . Iohexol Shortness Of Breath  . Penicillins Rash    Review of Systems negative for weight loss fever or recent upper respiratory infection. She has a dry chronic cough possibly from nasal drainage. Overall she is very debilitated from her arthritis and osteoporotic bone disease. She has some difficulty walking and has had spontaneous rib fractures and spontaneous compression fractures of her spine. She is frail and fairly weak. She's had no previous abdominal surgery other than appendectomy and a right angle hernia repair. 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 She was diagnosed with DVT of her left leg in August of this year and placed on Coumadin for which she has had no bleeding  complications. She denies history of stroke or seizure.  BP 121/83  Pulse 100  Resp 16  Ht 5' (1.524 m)  Wt 123 lb (55.792 kg)  BMI 24.02 kg/m2  SpO2 98% Physical Exam Gen. frail small Caucasian female in no distress HEENT disconjugate gaze pupils irregular from previous surgery, good dentition, normocephalic Neck without JVD mass or bruit Thorax pronounced kyphosis clear breath sounds but distant Cardiac regular rhythm with ectopy, late systolic click from mitral prolapse Abdomen soft nontender without organomegaly or pulsatile mass Extremities minimal pedal edema no cyanosis or clubbing Skin skin fragile no active ulceration Neurologic alert and oriented no focal motor deficit  Diagnostic Tests: The MRI of her breast which also included some of her thorax shows a fusiform a ascending thoracic aneurysm at 4.7 cm. This is clearly not the optimal study to assess the size or disease of the thoracic aorta and a CT angiogram with IV contrast will be performed using a steroid prep due to her known allergy to IV contrast.  Impression: The patient has recently diagnosed breast cancer. She is a moderate aneurysm of her ascending thoracic aorta of unknown duration. It is asymptomatic. She does have a history of hypertension. I'll recommend proceeding with her breast surgery but will place the patient on a beta blocker (Toprol-XL 25 mg daily ) in addition to her Norvasc. Risk of dissection from the information available is less than 5%. Because of her \ good LV function on echo and a normal myocardial perfusion scan performed earlier this year she should be clear for surgery. I'll plan on seeing her back after the first year with a CTA and then follow her with serial scans. Surgery would not be recommended less the aneurysm shows rapid increase in size or reaches greater than 5 cm in diameter. She understands the usual resenting symptom of a dissection is severe interscapular back pain.  Plan: CTA  in 6 weeks with steroid preparation do 2 IV contrast allergy

## 2011-09-28 ENCOUNTER — Encounter (INDEPENDENT_AMBULATORY_CARE_PROVIDER_SITE_OTHER): Payer: Self-pay | Admitting: Surgery

## 2011-09-28 ENCOUNTER — Telehealth (INDEPENDENT_AMBULATORY_CARE_PROVIDER_SITE_OTHER): Payer: Self-pay | Admitting: Surgery

## 2011-09-28 ENCOUNTER — Other Ambulatory Visit (INDEPENDENT_AMBULATORY_CARE_PROVIDER_SITE_OTHER): Payer: Self-pay | Admitting: Surgery

## 2011-09-28 DIAGNOSIS — C50919 Malignant neoplasm of unspecified site of unspecified female breast: Secondary | ICD-10-CM

## 2011-09-28 DIAGNOSIS — C50419 Malignant neoplasm of upper-outer quadrant of unspecified female breast: Secondary | ICD-10-CM

## 2011-09-28 NOTE — Telephone Encounter (Signed)
I reviewed the surgery with the patient again today and told her we will go ahead and make arrangements to get that done. I did speak with her thoracic surgeon and a further workup at this point needs to be done before we did a breast surgery. We'll get up with her primary care physician about perioperative management of her Coumadin.  I think all questions about the surgery risks and palpitations have been answered.

## 2011-09-28 NOTE — Telephone Encounter (Signed)
Left message

## 2011-09-28 NOTE — Progress Notes (Signed)
I spoke with Dr Mahlon Gammon and he felt just holding Coumadin would be adequate and that she did not need a "bridge."

## 2011-10-01 ENCOUNTER — Other Ambulatory Visit (INDEPENDENT_AMBULATORY_CARE_PROVIDER_SITE_OTHER): Payer: Self-pay | Admitting: Surgery

## 2011-10-01 DIAGNOSIS — C50911 Malignant neoplasm of unspecified site of right female breast: Secondary | ICD-10-CM

## 2011-10-02 ENCOUNTER — Telehealth (INDEPENDENT_AMBULATORY_CARE_PROVIDER_SITE_OTHER): Payer: Self-pay | Admitting: General Surgery

## 2011-10-02 NOTE — Telephone Encounter (Signed)
Patient asked about pre-operative coumadin. Made aware to stop 5 days prior to surgery.

## 2011-10-03 ENCOUNTER — Encounter (HOSPITAL_COMMUNITY): Payer: Self-pay | Admitting: Pharmacy Technician

## 2011-10-11 ENCOUNTER — Encounter (HOSPITAL_COMMUNITY)
Admission: RE | Admit: 2011-10-11 | Discharge: 2011-10-11 | Disposition: A | Discharge status: Home / Self Care | Payer: Medicare Other | Source: Ambulatory Visit | Attending: Surgery | Admitting: Surgery

## 2011-10-11 ENCOUNTER — Encounter (HOSPITAL_COMMUNITY): Payer: Self-pay

## 2011-10-11 DIAGNOSIS — C50419 Malignant neoplasm of upper-outer quadrant of unspecified female breast: Secondary | ICD-10-CM

## 2011-10-11 LAB — CBC
HCT: 42 % (ref 36.0–46.0)
MCH: 28.8 pg (ref 26.0–34.0)
MCV: 82.2 fL (ref 78.0–100.0)
Platelets: 206 10*3/uL (ref 150–400)
RDW: 14.5 % (ref 11.5–15.5)
WBC: 9.7 10*3/uL (ref 4.0–10.5)

## 2011-10-11 LAB — BASIC METABOLIC PANEL
BUN: 25 mg/dL — ABNORMAL HIGH (ref 6–23)
CO2: 29 mEq/L (ref 19–32)
Calcium: 9.5 mg/dL (ref 8.4–10.5)
Chloride: 100 mEq/L (ref 96–112)
Creatinine, Ser: 0.78 mg/dL (ref 0.50–1.10)
GFR calc Af Amer: >90 mL/min (ref >90)

## 2011-10-11 LAB — APTT: aPTT: 46 seconds — ABNORMAL HIGH (ref 24–37)

## 2011-10-11 NOTE — Progress Notes (Signed)
WILL CALL DR Terrilee Files OFFICE  FOR EKG,ECHO,POSS. STRESS TEST.

## 2011-10-11 NOTE — Pre-Procedure Instructions (Signed)
22 [name]  10/11/2011   Your procedure is scheduled on:  10/17/11  Report to [facility] at 830 AM.  Call this number if you have problems the morning of surgery: [phone]   Remember:   Do not eat food:After Midnight.  May have clear liquids: up to 4 Hours before arrival.  Clear liquids include soda, tea, black coffee, apple or grape juice, broth.  Take these medicines the morning of surgery with A SIP OF WATER: NORVASC,TOPROL,PRILOSEC,SYNTHROID   Do not wear jewelry, make-up or nail polish.  Do not wear lotions, powders, or perfumes. You may wear deodorant.  Do not shave 48 hours prior to surgery.  Do not bring valuables to the hospital.  Contacts, dentures or bridgework may not be worn into surgery.  Leave suitcase in the car. After surgery it may be brought to your room.  For patients admitted to the hospital, checkout time is 11:00 AM the day of discharge.   Patients discharged the day of surgery will not be allowed to drive home.  Name and phone number of your driver: FAMILY  Special Instructions: CHG Shower Use Special Wash: 1/2 bottle night before surgery and 1/2 bottle morning of surgery.   Please read over the following fact sheets that you were given: Pain Booklet, Coughing and Deep Breathing, MRSA Information and Surgical Site Infection Prevention

## 2011-10-16 MED ORDER — CIPROFLOXACIN IN D5W 400 MG/200ML IV SOLN
400.0000 mg | INTRAVENOUS | Status: DC
  Filled 2011-10-16: qty 200

## 2011-10-17 ENCOUNTER — Inpatient Hospital Stay (HOSPITAL_COMMUNITY): Payer: Medicare Other | Admitting: Anesthesiology

## 2011-10-17 ENCOUNTER — Encounter (HOSPITAL_COMMUNITY): Payer: Self-pay | Admitting: *Deleted

## 2011-10-17 ENCOUNTER — Inpatient Hospital Stay (HOSPITAL_COMMUNITY)
Admission: RE | Admit: 2011-10-17 | Discharge: 2011-10-17 | Disposition: A | Discharge status: Home / Self Care | Payer: Medicare Other | Source: Ambulatory Visit | Attending: Surgery | Admitting: Surgery

## 2011-10-17 ENCOUNTER — Ambulatory Visit (HOSPITAL_COMMUNITY)
Admission: RE | Admit: 2011-10-17 | Discharge: 2011-10-17 | Disposition: A | Discharge status: Home / Self Care | Payer: Medicare Other | Source: Ambulatory Visit | Attending: Surgery | Admitting: Surgery

## 2011-10-17 ENCOUNTER — Encounter (HOSPITAL_COMMUNITY): Payer: Self-pay | Admitting: Anesthesiology

## 2011-10-17 ENCOUNTER — Encounter (HOSPITAL_COMMUNITY)
Admission: RE | Disposition: A | Discharge status: Home / Self Care | Payer: Self-pay | Source: Ambulatory Visit | Attending: Surgery

## 2011-10-17 ENCOUNTER — Other Ambulatory Visit (INDEPENDENT_AMBULATORY_CARE_PROVIDER_SITE_OTHER): Payer: Self-pay | Admitting: Surgery

## 2011-10-17 DIAGNOSIS — I10 Essential (primary) hypertension: Secondary | ICD-10-CM | POA: Insufficient documentation

## 2011-10-17 DIAGNOSIS — D0590 Unspecified type of carcinoma in situ of unspecified breast: Secondary | ICD-10-CM | POA: Insufficient documentation

## 2011-10-17 DIAGNOSIS — C50911 Malignant neoplasm of unspecified site of right female breast: Secondary | ICD-10-CM

## 2011-10-17 DIAGNOSIS — Z01812 Encounter for preprocedural laboratory examination: Secondary | ICD-10-CM | POA: Insufficient documentation

## 2011-10-17 DIAGNOSIS — C50919 Malignant neoplasm of unspecified site of unspecified female breast: Secondary | ICD-10-CM

## 2011-10-17 DIAGNOSIS — E7800 Pure hypercholesterolemia, unspecified: Secondary | ICD-10-CM | POA: Insufficient documentation

## 2011-10-17 DIAGNOSIS — M810 Age-related osteoporosis without current pathological fracture: Secondary | ICD-10-CM | POA: Insufficient documentation

## 2011-10-17 DIAGNOSIS — K219 Gastro-esophageal reflux disease without esophagitis: Secondary | ICD-10-CM | POA: Insufficient documentation

## 2011-10-17 DIAGNOSIS — Z01818 Encounter for other preprocedural examination: Secondary | ICD-10-CM | POA: Insufficient documentation

## 2011-10-17 HISTORY — PX: MASTECTOMY, PARTIAL: SHX709

## 2011-10-17 SURGERY — BREAST LUMPECTOMY WITH SENTINEL LYMPH NODE BX
Anesthesia: General | Site: Breast | Laterality: Right

## 2011-10-17 MED ORDER — EPHEDRINE SULFATE 50 MG/ML IJ SOLN
INTRAMUSCULAR | Status: DC | PRN
  Administered 2011-10-17 (×5): 10 mg via INTRAVENOUS

## 2011-10-17 MED ORDER — HYDROMORPHONE HCL 2 MG PO TABS: 2.0000 mg | ORAL_TABLET | Freq: Two times a day (BID) | ORAL | Status: DC | PRN

## 2011-10-17 MED ORDER — HYDROMORPHONE HCL PF 1 MG/ML IJ SOLN
INTRAMUSCULAR | Status: AC
  Administered 2011-10-17: 0.5 mg via INTRAVENOUS
  Filled 2011-10-17: qty 1

## 2011-10-17 MED ORDER — CEFAZOLIN SODIUM 1-5 GM-% IV SOLN
INTRAVENOUS | Status: AC
  Filled 2011-10-17: qty 50

## 2011-10-17 MED ORDER — GLYCOPYRROLATE 0.2 MG/ML IJ SOLN
INTRAMUSCULAR | Status: DC | PRN
  Administered 2011-10-17: .6 mg via INTRAVENOUS

## 2011-10-17 MED ORDER — HYDROMORPHONE HCL PF 1 MG/ML IJ SOLN
0.2500 mg | INTRAMUSCULAR | Status: DC | PRN
  Administered 2011-10-17: 0.5 mg via INTRAVENOUS
  Administered 2011-10-17: 0.25 mg via INTRAVENOUS

## 2011-10-17 MED ORDER — ROCURONIUM BROMIDE 100 MG/10ML IV SOLN
INTRAVENOUS | Status: DC | PRN
  Administered 2011-10-17: 30 mg via INTRAVENOUS

## 2011-10-17 MED ORDER — NEOSTIGMINE METHYLSULFATE 1 MG/ML IJ SOLN
INTRAMUSCULAR | Status: DC | PRN
  Administered 2011-10-17: 4 mg via INTRAVENOUS

## 2011-10-17 MED ORDER — MEPERIDINE HCL 25 MG/ML IJ SOLN: 6.2500 mg | INTRAMUSCULAR | Status: DC | PRN

## 2011-10-17 MED ORDER — PROMETHAZINE HCL 25 MG/ML IJ SOLN: 6.2500 mg | INTRAMUSCULAR | Status: DC | PRN

## 2011-10-17 MED ORDER — LACTATED RINGERS IV SOLN: INTRAVENOUS | Status: DC

## 2011-10-17 MED ORDER — TECHNETIUM TC 99M SULFUR COLLOID FILTERED
1.0000 | Freq: Once | INTRAVENOUS | Status: AC | PRN
  Administered 2011-10-17: 1 via INTRADERMAL

## 2011-10-17 MED ORDER — CIPROFLOXACIN IN D5W 400 MG/200ML IV SOLN
400.0000 mg | INTRAVENOUS | Status: AC
  Administered 2011-10-17: 400 mg via INTRAVENOUS
  Filled 2011-10-17: qty 200

## 2011-10-17 MED ORDER — BUPIVACAINE HCL (PF) 0.25 % IJ SOLN
INTRAMUSCULAR | Status: DC | PRN
  Administered 2011-10-17: 30 mL

## 2011-10-17 MED ORDER — FENTANYL CITRATE 0.05 MG/ML IJ SOLN
50.0000 ug | INTRAMUSCULAR | Status: DC | PRN
  Administered 2011-10-17 (×2): 50 ug via INTRAVENOUS

## 2011-10-17 MED ORDER — LACTATED RINGERS IV SOLN
INTRAVENOUS | Status: DC | PRN
  Administered 2011-10-17 (×2): via INTRAVENOUS

## 2011-10-17 MED ORDER — ONDANSETRON HCL 4 MG/2ML IJ SOLN
INTRAMUSCULAR | Status: DC | PRN
  Administered 2011-10-17 (×2): 4 mg via INTRAVENOUS

## 2011-10-17 MED ORDER — MIDAZOLAM HCL 2 MG/2ML IJ SOLN: 1.0000 mg | INTRAMUSCULAR | Status: DC | PRN

## 2011-10-17 MED ORDER — MIDAZOLAM HCL 5 MG/5ML IJ SOLN
INTRAMUSCULAR | Status: DC | PRN
  Administered 2011-10-17: 2 mg via INTRAVENOUS

## 2011-10-17 MED ORDER — ACETAMINOPHEN 10 MG/ML IV SOLN
INTRAVENOUS | Status: DC | PRN
  Administered 2011-10-17: 1000 mg via INTRAVENOUS

## 2011-10-17 MED ORDER — SODIUM CHLORIDE 0.9 % IJ SOLN
INTRAMUSCULAR | Status: DC | PRN
  Administered 2011-10-17: 12:00:00 via INTRAMUSCULAR

## 2011-10-17 MED ORDER — DEXAMETHASONE SODIUM PHOSPHATE 4 MG/ML IJ SOLN
INTRAMUSCULAR | Status: DC | PRN
  Administered 2011-10-17: 4 mg via INTRAVENOUS

## 2011-10-17 MED ORDER — FENTANYL CITRATE 0.05 MG/ML IJ SOLN
INTRAMUSCULAR | Status: DC | PRN
  Administered 2011-10-17 (×2): 50 ug via INTRAVENOUS

## 2011-10-17 MED ORDER — ACETAMINOPHEN 10 MG/ML IV SOLN
INTRAVENOUS | Status: AC
  Filled 2011-10-17: qty 100

## 2011-10-17 MED ORDER — PHENYLEPHRINE HCL 10 MG/ML IJ SOLN
INTRAMUSCULAR | Status: DC | PRN
  Administered 2011-10-17: 80 ug via INTRAVENOUS
  Administered 2011-10-17 (×2): 40 ug via INTRAVENOUS

## 2011-10-17 MED ORDER — PROPOFOL 10 MG/ML IV EMUL
INTRAVENOUS | Status: DC | PRN
  Administered 2011-10-17: 100 mg via INTRAVENOUS

## 2011-10-17 SURGICAL SUPPLY — 50 items
APPLIER CLIP 9.375 MED OPEN (MISCELLANEOUS) ×2
APPLIER CLIP 9.375 SM OPEN (CLIP) ×4
BINDER BREAST LRG (GAUZE/BANDAGES/DRESSINGS) IMPLANT
BINDER BREAST XLRG (GAUZE/BANDAGES/DRESSINGS) IMPLANT
BLADE SURG 15 STRL LF DISP TIS (BLADE) ×1 IMPLANT
BLADE SURG 15 STRL SS (BLADE) ×1
CANISTER SUCTION 2500CC (MISCELLANEOUS) ×2 IMPLANT
CHLORAPREP W/TINT 26ML (MISCELLANEOUS) ×2 IMPLANT
CLIP APPLIE 9.375 MED OPEN (MISCELLANEOUS) ×1 IMPLANT
CLIP APPLIE 9.375 SM OPEN (CLIP) ×2 IMPLANT
CLOTH BEACON ORANGE TIMEOUT ST (SAFETY) ×2 IMPLANT
CONT SPEC 4OZ CLIKSEAL STRL BL (MISCELLANEOUS) ×4 IMPLANT
COVER PROBE W GEL 5X96 (DRAPES) ×2 IMPLANT
COVER SURGICAL LIGHT HANDLE (MISCELLANEOUS) ×2 IMPLANT
DERMABOND ADHESIVE PROPEN (GAUZE/BANDAGES/DRESSINGS) ×1
DERMABOND ADVANCED (GAUZE/BANDAGES/DRESSINGS) ×1
DERMABOND ADVANCED .7 DNX12 (GAUZE/BANDAGES/DRESSINGS) ×1 IMPLANT
DERMABOND ADVANCED .7 DNX6 (GAUZE/BANDAGES/DRESSINGS) ×1 IMPLANT
DEVICE DUBIN SPECIMEN MAMMOGRA (MISCELLANEOUS) ×2 IMPLANT
DRAPE LAPAROSCOPIC ABDOMINAL (DRAPES) ×2 IMPLANT
ELECT CAUTERY BLADE 6.4 (BLADE) ×2 IMPLANT
ELECT REM PT RETURN 9FT ADLT (ELECTROSURGICAL) ×2
ELECTRODE REM PT RTRN 9FT ADLT (ELECTROSURGICAL) ×1 IMPLANT
GLOVE BIOGEL PI IND STRL 6.5 (GLOVE) ×2 IMPLANT
GLOVE BIOGEL PI INDICATOR 6.5 (GLOVE) ×2
GLOVE EUDERMIC 7 POWDERFREE (GLOVE) ×4 IMPLANT
GLOVE SS BIOGEL STRL SZ 6.5 (GLOVE) ×1 IMPLANT
GLOVE SUPERSENSE BIOGEL SZ 6.5 (GLOVE) ×1
GOWN PREVENTION PLUS XLARGE (GOWN DISPOSABLE) ×2 IMPLANT
GOWN STRL NON-REIN LRG LVL3 (GOWN DISPOSABLE) ×2 IMPLANT
KIT BASIN OR (CUSTOM PROCEDURE TRAY) ×2 IMPLANT
KIT MARKER MARGIN INK (KITS) IMPLANT
KIT ROOM TURNOVER OR (KITS) ×2 IMPLANT
NEEDLE 18GX1X1/2 (RX/OR ONLY) (NEEDLE) ×2 IMPLANT
NEEDLE HYPO 25GX1X1/2 BEV (NEEDLE) ×4 IMPLANT
NS IRRIG 1000ML POUR BTL (IV SOLUTION) ×2 IMPLANT
PACK SURGICAL SETUP 50X90 (CUSTOM PROCEDURE TRAY) ×2 IMPLANT
PAD ARMBOARD 7.5X6 YLW CONV (MISCELLANEOUS) ×4 IMPLANT
PENCIL BUTTON HOLSTER BLD 10FT (ELECTRODE) ×2 IMPLANT
SPONGE LAP 4X18 X RAY DECT (DISPOSABLE) ×4 IMPLANT
SUT MON AB 4-0 PC3 18 (SUTURE) ×2 IMPLANT
SUT SILK 2 0 SH (SUTURE) ×2 IMPLANT
SUT VIC AB 3-0 SH 18 (SUTURE) ×2 IMPLANT
SYR BULB 3OZ (MISCELLANEOUS) ×2 IMPLANT
SYR CONTROL 10ML LL (SYRINGE) ×4 IMPLANT
TOWEL OR 17X24 6PK STRL BLUE (TOWEL DISPOSABLE) ×2 IMPLANT
TOWEL OR 17X26 10 PK STRL BLUE (TOWEL DISPOSABLE) ×2 IMPLANT
TUBE CONNECTING 12X1/4 (SUCTIONS) ×2 IMPLANT
WATER STERILE IRR 1000ML POUR (IV SOLUTION) IMPLANT
YANKAUER SUCT BULB TIP NO VENT (SUCTIONS) ×2 IMPLANT

## 2011-10-17 NOTE — Anesthesia Preprocedure Evaluation (Addendum)
Anesthesia Evaluation  Patient identified by MRN, date of birth, ID band Patient awake    Reviewed: Allergy & Precautions, H&P , NPO status , Patient's Chart, lab work & pertinent test results, reviewed documented beta blocker date and time   Airway Mallampati: II TM Distance: >3 FB     Dental  (+) Dental Advisory Given   Pulmonary neg pulmonary ROS,  clear to auscultation        Cardiovascular hypertension, Pt. on medications + Valvular Problems/Murmurs MVP and AI Regular Normal Hx of thoracic aneurysm followed by medical doctor. No symptoms presently. Will get follow-up in January by Dr. Morton Peters.   Neuro/Psych Negative Neurological ROS     GI/Hepatic Neg liver ROS, GERD-  Controlled,  Endo/Other  Negative Endocrine ROSHypothyroidism   Renal/GU negative Renal ROS     Musculoskeletal  (+) Fibromyalgia -  Abdominal   Peds  Hematology   Anesthesia Other Findings   Reproductive/Obstetrics                          Anesthesia Physical Anesthesia Plan  ASA: III  Anesthesia Plan: General   Post-op Pain Management:    Induction: Intravenous  Airway Management Planned: LMA  Additional Equipment:   Intra-op Plan:   Post-operative Plan: Extubation in OR  Informed Consent: I have reviewed the patients History and Physical, chart, labs and discussed the procedure including the risks, benefits and alternatives for the proposed anesthesia with the patient or authorized representative who has indicated his/her understanding and acceptance.   Dental advisory given  Plan Discussed with: CRNA and Surgeon  Anesthesia Plan Comments:         Anesthesia Quick Evaluation

## 2011-10-17 NOTE — Transfer of Care (Signed)
Immediate Anesthesia Transfer of Care Note  Patient: [name]  Procedure(s) Performed:  BREAST LUMPECTOMY WITH SENTINEL LYMPH NODE BX - blue dye injection; MASTECTOMY PARTIAL - needle guided  Patient Location: PACU  Anesthesia Type: General  Level of Consciousness: awake, alert  and patient cooperative  Airway & Oxygen Therapy: Patient Spontanous Breathing and Patient connected to nasal cannula oxygen  Post-op Assessment: Report given to PACU RN, Post -op Vital signs reviewed and stable and Patient moving all extremities X 4  Post vital signs: Reviewed and stable  Complications: No apparent anesthesia complications

## 2011-10-17 NOTE — Preoperative (Signed)
Beta Blockers   Reason not to administer Beta Blockers:Not Applicable 

## 2011-10-17 NOTE — Anesthesia Procedure Notes (Signed)
Procedure Name: Intubation Date/Time: 10/17/2011 11:20 AM Performed by: Leona Singleton A. Oxygen Delivery Method: Circle System Utilized Preoxygenation: Pre-oxygenation with 100% oxygen Intubation Type: IV induction Ventilation: Mask ventilation without difficulty Laryngoscope Size: Miller and 2 Grade View: Grade I Tube type: Oral Tube size: 7.0 mm Number of attempts: 1 Placement Confirmation: ETT inserted through vocal cords under direct vision,  breath sounds checked- equal and bilateral and positive ETCO2 Secured at: 21 cm Tube secured with: Tape Dental Injury: Teeth and Oropharynx as per pre-operative assessment

## 2011-10-17 NOTE — OR Nursing (Signed)
Late entry:updated  bovie site placement & prepping & supplies .Maurine Minister RNFA  12-5 2012 @12 :41

## 2011-10-17 NOTE — OR Nursing (Signed)
X-ray of specimen taken and I spoke with Dr. Margy Clarks in radiology at [facility]. Pin is placed at (F,6).  Nothing else needed at this time.

## 2011-10-17 NOTE — Anesthesia Postprocedure Evaluation (Signed)
  Anesthesia Post-op Note  Patient: [name]  Procedure(s) Performed:  BREAST LUMPECTOMY WITH SENTINEL LYMPH NODE BX - blue dye injection; MASTECTOMY PARTIAL - needle guided  Patient Location: PACU  Anesthesia Type: General  Level of Consciousness: awake  Airway and Oxygen Therapy: Patient Spontanous Breathing  Post-op Pain: mild  Post-op Assessment: Post-op Vital signs reviewed  Post-op Vital Signs: stable  Complications: No apparent anesthesia complications

## 2011-10-17 NOTE — H&P (Signed)
[name] is an 75 y.o. female.   Chief Complaint: Right breast cancer HPI:  Chief Complaint   Patient presents with   .  breast cancer   HPI  [name] is a 75 y.o. female. She recently had a mammogram which showed a 1 cm area of calcifications are moderately suspicious. 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 There was inadequate tissue for hormone receptor tests. A mammogram has shown only a single area, but it appears about 4 cm of abnormality.  Other than a few this, the patient has never had any prior breast problems. She was asymptomatic and had not noted any thing at the time of her mammogram she has a negative family history for breast cancer.After she left the office we found our old chart on her with a paper record. Dating back to 1982 she has been followed intermittently in our office for fibrocystic changes. I have asked she seen her for this over several years and she been seen by one of my partners prior to that. She was last seen for that here in 2007 She has been seen for her aneurysm and no intervention planned. Dr Valentina Lucks thought she did not need a heparin bridge   Past Medical History  Diagnosis Date  . GERD (gastroesophageal reflux disease)   . Hypercholesteremia   . Osteoporosis   . Thyroid disease   . Rib fractures   . Fracture lumbar vertebra-closed   . Fracture of thoracic vertebra, closed   . Cancer 10/12    bx/right breast  . History of blood clots   . Cataract 3 and 10/92    bilateral  . Hypertension     DR Terrilee Files    Past Surgical History  Procedure Date  . Hernia repair 10/16/2006    [facility] - Dr Daphine Deutscher  . Appendectomy 1940  . Tonsillectomy 1944  . Cataract extraction 1992  . Eye surgery 1940, 1956    History reviewed. No pertinent family history. Social History:  reports that she has never smoked. She does not have any smokeless tobacco history on file. She reports that she does not drink alcohol or use  illicit drugs.  Allergies:  Allergies  Allergen Reactions  . Contrast Media (Iodinated Diagnostic Agents) Shortness Of Breath  . Iohexol Shortness Of Breath  . Penicillins Rash    Medications Prior to Admission  Medication Dose Route Frequency Provider Last Rate Last Dose  . ciprofloxacin (CIPRO) IVPB 400 mg  400 mg Intravenous To OR Kendra P Hiatt, PHARMD      . fentaNYL (SUBLIMAZE) injection 50-100 mcg  50-100 mcg Intravenous PRN Judie Petit, MD      . midazolam (VERSED) injection 1-2 mg  1-2 mg Intravenous PRN Judie Petit, MD      . DISCONTD: ciprofloxacin (CIPRO) IVPB 400 mg  400 mg Intravenous 120 min pre-op Currie Paris, MD       Medications Prior to Admission  Medication Sig Dispense Refill  . amitriptyline (ELAVIL) 50 MG tablet Take 50 mg by mouth at bedtime.       Marland Kitchen amLODipine (NORVASC) 10 MG tablet Take 10 mg by mouth daily.       Marland Kitchen BONIVA 150 MG tablet Take 150 mg by mouth every 30 (thirty) days.       . calcium citrate-vitamin D (CITRACAL+D) 315-200 MG-UNIT per tablet Take 1 tablet by mouth 2 (two) times daily.       Marland Kitchen  ENABLEX 15 MG 24 hr tablet Take 15 mg by mouth daily.       Marland Kitchen HYDROcodone-acetaminophen (VICODIN) 5-500 MG per tablet Take 1 tablet by mouth every 8 (eight) hours as needed. For pain      . Multiple Vitamin (MULTIVITAMIN) tablet Take 1 tablet by mouth daily.       . pravastatin (PRAVACHOL) 20 MG tablet Take 20 mg by mouth every evening.       Marland Kitchen PRILOSEC OTC 20 MG tablet Take 20 mg by mouth daily.       Marland Kitchen SYNTHROID 100 MCG tablet Take 100 mcg by mouth daily.       Marland Kitchen warfarin (COUMADIN) 2.5 MG tablet Take 1.25-2.5 mg by mouth daily. Alternates days taking 1 tab one day then 0.5 tab the next        No results found for this or any previous visit (from the past 48 hour(s)). No results found.  ROS No change from prior office visit  Blood pressure 127/70, pulse 94, temperature 98.2 F (36.8 C), temperature source Oral, resp. rate 18, SpO2  97.00%. Physical Exam  Constitutional: She is oriented to person, place, and time. She appears well-developed and well-nourished. No distress.  HENT:  Head: Normocephalic and atraumatic.  Eyes: Conjunctivae and EOM are normal. Pupils are equal, round, and reactive to light. Left eye exhibits no discharge. No scleral icterus.  Neck: No tracheal deviation present. No thyromegaly present.  Cardiovascular: Normal rate and normal heart sounds.   Respiratory: Effort normal and breath sounds normal. No respiratory distress.  GI: Soft. Bowel sounds are normal. She exhibits distension. There is no tenderness.  Musculoskeletal: She exhibits no edema and no tenderness.  Neurological: She is alert and oriented to person, place, and time.  Skin: Skin is warm and dry. She is not diaphoretic.  Psychiatric: She has a normal mood and affect. Her behavior is normal.     Assessment/Plan Right breast cancer \\Right  NL lumpectomy and sentinel node  Hosea Hanawalt J 10/17/2011, 10:51 AM

## 2011-10-17 NOTE — Op Note (Signed)
[name]  1936-10-15  [id number]  10/17/2011   Preoperative diagnosis: right breast cancer upper outer quadrant clinical stage 0  Postoperative diagnosis: same  Procedure: right partial mastectomy with blue dye injection and axillary sentinel lymph node  Surgeon: Currie Paris, MD, FACS  Anesthesia: General  Clinical History and Indications: this patient presented with some abnormal calcifications in the upper outer quadrant of the right breast and a core biopsy showed DCIS. After evaluation by me, the medical and radiation oncologists, was elected to proceed to a needle guided excision of the cancer with sentinel node evaluation. The patient understood all of the issues and was prepared for surgery.Description of Procedure:the patient was seen in the holding area and I confirmed with the plans for the procedure as noted above. I reviewed the films from the needle localization and noted that it was approximately 7.8 cm from the skin entry point to the abnormality. I reviewed the plans for the surgery and the patient had no further questions. I initialed the right breast as the operative side.  The patient was taken to the operating room and after satisfactory general endotracheal anesthesia had been obtained the timeout was done. I injected 5 cc of dilute methylene blue around the nipple areolar complex. This size again. A full prep and drape was done. Trying to triangulate from the films I thought that the main lesion was going to be about 3.5 cm above and lateral to the nipple and a skin mark fair and centered an incision at that point in a curvilinear fashion.  I divided about 1 cm of breast tissue and then divided the superior margin going towards the guidewire and going deep. I encountered the guidewire at its thickened point. I divided the breast tissue above that down to the chest wall thin well medial and then raised the flap in fairly close under the nipple down to the  chest wall and finally lateral. I took a very large area out. Most of this area appear to be fatty tissue but there was some breast tissue near the superior lateral margin and it took that out as well. Specimen mammogram showed the clip in the specimen.  I spent time irrigating and making sure everything was dry. I then elevated the breast tissue off of the fascia superiorly and inferiorly and closed the deep layer over the muscle. I put clips and to mark the margins prior to closing.  I elevated the skin a little bit off the breast tissue to prevent dimpling. I then closed the subcutaneous with 3-0 Vicryl and the skin with 4-0 Monocryl subcuticular and Dermabond.  Attention was turned to the axilla. I identified a hot area made a transverse incision and entered the axillary fat. I immediately found a blue lymphatic leading to a blue lymph node and this was removed. It had counts of about 2100. There is no palpable other adenopathy, no other blue nodes were lymphatics, and no other hot areas.  I infiltrated 0.25% plain Marcaine here and closed in layers with 3-0 Vicryl, 4-0 Monocryl subcuticular, and Dermabond.  The patient tolerated the procedure well. There were no operative complications. All counts were correct.   EBL: minimal  Currie Paris, MD, FACS 10/17/2011 12:16 PM

## 2011-10-19 ENCOUNTER — Telehealth (INDEPENDENT_AMBULATORY_CARE_PROVIDER_SITE_OTHER): Payer: Self-pay | Admitting: General Surgery

## 2011-10-19 NOTE — Telephone Encounter (Signed)
Patient aware path results are good. Lymph nodes negative and margins ok. She will follow up in the office at her scheduled appt and call with any questions prior.  

## 2011-10-19 NOTE — Telephone Encounter (Signed)
Message copied by Liliana Cline on Fri Oct 19, 2011  4:41 PM ------      Message from: Currie Paris      Created: Fri Oct 19, 2011  3:34 PM       Tell the patient that her margins are OK and her lymph nodes are negative. I will discuss in detail in the office.

## 2011-10-23 ENCOUNTER — Encounter (HOSPITAL_COMMUNITY): Payer: Self-pay | Admitting: Surgery

## 2011-10-24 ENCOUNTER — Encounter (INDEPENDENT_AMBULATORY_CARE_PROVIDER_SITE_OTHER): Payer: Self-pay | Admitting: Surgery

## 2011-10-24 ENCOUNTER — Other Ambulatory Visit: Payer: Self-pay | Admitting: Cardiothoracic Surgery

## 2011-10-24 DIAGNOSIS — I712 Thoracic aortic aneurysm, without rupture: Secondary | ICD-10-CM

## 2011-10-26 ENCOUNTER — Encounter (INDEPENDENT_AMBULATORY_CARE_PROVIDER_SITE_OTHER): Payer: Self-pay | Admitting: Surgery

## 2011-10-31 ENCOUNTER — Encounter (INDEPENDENT_AMBULATORY_CARE_PROVIDER_SITE_OTHER): Payer: Self-pay | Admitting: Surgery

## 2011-11-02 ENCOUNTER — Encounter (INDEPENDENT_AMBULATORY_CARE_PROVIDER_SITE_OTHER): Payer: Self-pay | Admitting: Surgery

## 2011-11-02 ENCOUNTER — Encounter: Payer: Self-pay | Admitting: *Deleted

## 2011-11-02 ENCOUNTER — Ambulatory Visit (INDEPENDENT_AMBULATORY_CARE_PROVIDER_SITE_OTHER): Payer: Medicare Other | Admitting: Surgery

## 2011-11-02 VITALS — BP 122/88 | HR 64 | Temp 97.4°F | Resp 16 | Ht 60.0 in | Wt 123.2 lb

## 2011-11-02 DIAGNOSIS — C50919 Malignant neoplasm of unspecified site of unspecified female breast: Secondary | ICD-10-CM

## 2011-11-02 DIAGNOSIS — Z09 Encounter for follow-up examination after completed treatment for conditions other than malignant neoplasm: Secondary | ICD-10-CM

## 2011-11-02 DIAGNOSIS — I712 Thoracic aortic aneurysm, without rupture: Secondary | ICD-10-CM

## 2011-11-02 NOTE — Patient Instructions (Signed)
We will arrange medical and radiation oncology appointments. I will see you in about a month

## 2011-11-02 NOTE — Progress Notes (Signed)
[name]    [id number] 11/02/2011    06/10/1936   CC: Post op Right lumpectomy and SLN  HPI: The patient returns for post op follow-up. She underwent a NL Right lumpectomy with sentinel node on 10/17/2011. Over all she feels that she is doing well. She notes she is scheduled for a F/U CT of her aortic aneurysm on jan 2  PE: The incision is healing nicely and there is no evidence of infection or hematoma.  DATA REVIEWED: Pathology report showed Hi grade DCIS close to posterior margin, but negative margin, receptor +, SLN negaitve  IMPRESSION: Patient doing well.   PLAN: Her next visit will be in one month.Will arrange med and radiation oncology appointments

## 2011-11-08 ENCOUNTER — Telehealth: Payer: Self-pay | Admitting: *Deleted

## 2011-11-08 ENCOUNTER — Telehealth (INDEPENDENT_AMBULATORY_CARE_PROVIDER_SITE_OTHER): Payer: Self-pay

## 2011-11-08 NOTE — Telephone Encounter (Signed)
Confirmed 11/19/11 appt w/ pt.  Mailed before letter & packet to pt.

## 2011-11-08 NOTE — Telephone Encounter (Signed)
Pt LMOM that she has not yet heard from oncology re: appt. I tried to reach Tami but she is off until next weds. I spoke with Dawn at [facility] and she will track referral and let us know what the delay is. Pt is aware.

## 2011-11-14 ENCOUNTER — Encounter: Payer: Self-pay | Admitting: Cardiothoracic Surgery

## 2011-11-14 ENCOUNTER — Ambulatory Visit (INDEPENDENT_AMBULATORY_CARE_PROVIDER_SITE_OTHER): Payer: Medicare Other | Admitting: Cardiothoracic Surgery

## 2011-11-14 ENCOUNTER — Other Ambulatory Visit: Payer: Medicare Other

## 2011-11-14 ENCOUNTER — Ambulatory Visit (HOSPITAL_COMMUNITY)
Admission: RE | Admit: 2011-11-14 | Discharge: 2011-11-14 | Disposition: A | Discharge status: Home / Self Care | Payer: Medicare Other | Source: Ambulatory Visit | Attending: Cardiothoracic Surgery | Admitting: Cardiothoracic Surgery

## 2011-11-14 VITALS — BP 123/80 | HR 117 | Resp 18 | Ht 60.0 in | Wt 123.0 lb

## 2011-11-14 DIAGNOSIS — K449 Diaphragmatic hernia without obstruction or gangrene: Secondary | ICD-10-CM | POA: Diagnosis not present

## 2011-11-14 DIAGNOSIS — J449 Chronic obstructive pulmonary disease, unspecified: Secondary | ICD-10-CM | POA: Diagnosis not present

## 2011-11-14 DIAGNOSIS — I712 Thoracic aortic aneurysm, without rupture, unspecified: Secondary | ICD-10-CM | POA: Insufficient documentation

## 2011-11-14 DIAGNOSIS — I517 Cardiomegaly: Secondary | ICD-10-CM | POA: Insufficient documentation

## 2011-11-14 DIAGNOSIS — J438 Other emphysema: Secondary | ICD-10-CM | POA: Diagnosis not present

## 2011-11-14 DIAGNOSIS — J4489 Other specified chronic obstructive pulmonary disease: Secondary | ICD-10-CM | POA: Insufficient documentation

## 2011-11-14 MED ORDER — IOHEXOL 300 MG/ML  SOLN: 100.0000 mL | Freq: Once | INTRAMUSCULAR | Status: AC | PRN

## 2011-11-14 MED ORDER — IOHEXOL 300 MG/ML  SOLN
100.0000 mL | Freq: Once | INTRAMUSCULAR | Status: AC | PRN
  Administered 2011-11-14: 100 mL via INTRAVENOUS

## 2011-11-14 NOTE — Patient Instructions (Signed)
Continue toprol XL. Start aspirin 81 mg when Coumadin is discontinued as treatment for DVT August 2012

## 2011-11-14 NOTE — Progress Notes (Signed)
HPI:                             [address]            [name] [id number]          [id number]      The patient returns for further evaluation of a recently diagnosed ascending fusiform aortic thoracic aneurysm measuring 4.7 cm in diameter. This was an incidental finding when she had a MRI of the chest for a recently diagnosed breast cancer. 4 weeks ago she underwent a right lumpectomy and is in the process of starting radiation therapy. It appears that the lymph nodes were negative. She denies any chest pain. She is a frail woman with kyphosis COPD and atherosclerotic changes of her thoracic aorta. A CT with contrast was performed after the patient was prepped with prednisone and Benadryl per protocol. The CT shows a fusiform a setting aneurysm without penetrating ulcer or significant calcium measuring 4.7 cm in diameter. The descending thoracic aorta does have some calcification but the diameter is 2.7 cm. There is no evidence of dissection or hematoma.  Current Outpatient Prescriptions  Medication Sig Dispense Refill  . amitriptyline (ELAVIL) 50 MG tablet Take 50 mg by mouth at bedtime.       Marland Kitchen amLODipine (NORVASC) 10 MG tablet Take 10 mg by mouth daily.       Marland Kitchen BONIVA 150 MG tablet Take 150 mg by mouth every 30 (thirty) days.       . calcium citrate-vitamin D (CITRACAL+D) 315-200 MG-UNIT per tablet Take 1 tablet by mouth 2 (two) times daily.       . cholecalciferol (VITAMIN D) 1000 UNITS tablet Take 1,000 Units by mouth daily.        . ENABLEX 15 MG 24 hr tablet Take 15 mg by mouth daily.       Marland Kitchen HYDROcodone-acetaminophen (VICODIN) 5-500 MG per tablet Take 1 tablet by mouth every 8 (eight) hours as needed. For pain      . metoprolol succinate (TOPROL-XL) 25 MG 24 hr tablet Take 25 mg by mouth daily.        . Multiple Vitamin (MULTIVITAMIN) tablet Take 1 tablet by mouth daily.       . pravastatin (PRAVACHOL) 20 MG tablet Take 20 mg by mouth every evening.       Marland Kitchen PRILOSEC  OTC 20 MG tablet Take 20 mg by mouth daily.       Marland Kitchen SYNTHROID 100 MCG tablet Take 100 mcg by mouth daily.       Marland Kitchen warfarin (COUMADIN) 2.5 MG tablet Take 1.25-2.5 mg by mouth daily. Alternates days taking 1 tab one day then 0.5 tab the next       No current facility-administered medications for this visit.   Facility-Administered Medications Ordered in Other Visits  Medication Dose Route Frequency Provider Last Rate Last Dose  . iohexol (OMNIPAQUE) 300 MG/ML solution 100 mL  100 mL Intravenous Once PRN Medication Radiologist      . iohexol (OMNIPAQUE) 300 MG/ML solution 100 mL  100 mL Intravenous Once PRN Medication Radiologist   100 mL at 11/14/11 1419     Physical Exam: Vital signs blood pressure 105/60 pulse 88 saturation room air 95% General appearance is that of an elderly frail Caucasian female no acute distress HEENT normocephalic dentition good Neck without JVD or adenopathy Chest kyphotic deformity distant breath sounds Cardiac regular  rhythm without gallop soft 1-2/6 systolic murmur Extremities without edema or tenderness, warm  Diagnostic Tests: CTA of the thoracic aorta with results as above the images were reviewed the patient in the office PACS station  Impression: Newly diagnosed moderate ascending  fusiform aneurysm of the thoracic aorta. The patient is very frail and fragile be extremely high operative risk. The risk for dissection or tear is very low at this current diameter certainly much lower than the risk of surgery. We will continue to follow her with serial CT scans  Plan: The patient will return in one year with a CT scan with a steroid preparation.

## 2011-11-15 DIAGNOSIS — Z7901 Long term (current) use of anticoagulants: Secondary | ICD-10-CM | POA: Diagnosis not present

## 2011-11-16 ENCOUNTER — Other Ambulatory Visit: Payer: Self-pay | Admitting: *Deleted

## 2011-11-16 DIAGNOSIS — D0510 Intraductal carcinoma in situ of unspecified breast: Secondary | ICD-10-CM

## 2011-11-19 ENCOUNTER — Other Ambulatory Visit (HOSPITAL_BASED_OUTPATIENT_CLINIC_OR_DEPARTMENT_OTHER): Payer: Medicare Other | Admitting: Lab

## 2011-11-19 ENCOUNTER — Ambulatory Visit (HOSPITAL_BASED_OUTPATIENT_CLINIC_OR_DEPARTMENT_OTHER): Payer: Medicare Other | Admitting: Oncology

## 2011-11-19 ENCOUNTER — Telehealth: Payer: Self-pay | Admitting: Oncology

## 2011-11-19 ENCOUNTER — Ambulatory Visit: Payer: Medicare Other

## 2011-11-19 VITALS — BP 128/81 | HR 67 | Temp 98.0°F | Ht <= 58 in | Wt 124.3 lb

## 2011-11-19 DIAGNOSIS — D0590 Unspecified type of carcinoma in situ of unspecified breast: Secondary | ICD-10-CM

## 2011-11-19 DIAGNOSIS — Z170 Estrogen receptor positive status [ER+]: Secondary | ICD-10-CM | POA: Diagnosis not present

## 2011-11-19 DIAGNOSIS — Z86718 Personal history of other venous thrombosis and embolism: Secondary | ICD-10-CM | POA: Diagnosis not present

## 2011-11-19 DIAGNOSIS — M810 Age-related osteoporosis without current pathological fracture: Secondary | ICD-10-CM

## 2011-11-19 DIAGNOSIS — D0510 Intraductal carcinoma in situ of unspecified breast: Secondary | ICD-10-CM

## 2011-11-19 LAB — COMPREHENSIVE METABOLIC PANEL
AST: 21 U/L (ref 0–37)
BUN: 27 mg/dL — ABNORMAL HIGH (ref 6–23)
Calcium: 9.3 mg/dL (ref 8.4–10.5)
Chloride: 102 mEq/L (ref 96–112)
Creatinine, Ser: 0.83 mg/dL (ref 0.50–1.10)
Glucose, Bld: 109 mg/dL — ABNORMAL HIGH (ref 70–99)

## 2011-11-19 LAB — CBC WITH DIFFERENTIAL/PLATELET
Basophils Absolute: 0 10*3/uL (ref 0.0–0.1)
EOS%: 3.8 % (ref 0.0–7.0)
Eosinophils Absolute: 0.3 10*3/uL (ref 0.0–0.5)
HCT: 37.9 % (ref 34.8–46.6)
HGB: 12.9 g/dL (ref 11.6–15.9)
MCH: 29 pg (ref 25.1–34.0)
MCV: 85.4 fL (ref 79.5–101.0)
NEUT%: 52.5 % (ref 38.4–76.8)
lymph#: 2.7 10*3/uL (ref 0.9–3.3)

## 2011-11-19 NOTE — Telephone Encounter (Signed)
Gv pt appt for april2013 °

## 2011-11-20 ENCOUNTER — Encounter: Payer: Self-pay | Admitting: *Deleted

## 2011-11-20 NOTE — Progress Notes (Signed)
Patient History and Physical   [name] [id number] 1936/04/11 76 y.o. 11/20/2011  CC: Dr Jonny Ruiz griffin; dr Cicero Duck; dr Kathlee Nations tright  Chief Complaint: Abnormal mammogram  HPI:  This is a pleasant 76 year old woman from [place]. She underwent  annual screening mammography. Screening mammogram 07/29/2000 showed a focal cluster of microcalcifications in the upper outer quadrant which appeared to be new. Stereotactic biopsy is recommended. Biopsy on 08/20/2011 showed high-grade DCIS. ER +100% PR +40%. MRI scan performed on 08/27/2011 showed a area of masslike linear and enhancement measuring 4.1 x 1 x 3.2 cm. No other abnormalities were seen. Of note was the presence of a 4.7 cm ascending thoracic aortic aneurysm and some cardiomegaly.  The patient has since been seen by Dr. Morton Peters and  has formerly assessed her aneurysm and  has recommended observation.  PMH: Past Medical History  Diagnosis Date  . GERD (gastroesophageal reflux disease)   . Hypercholesteremia   . Osteoporosis   . Thyroid disease   . Rib fractures   . Fracture lumbar vertebra-closed   . Fracture of thoracic vertebra, closed   . Cancer 10/12    bx/right breast  . History of blood clots x 2 , 2002, 2012; both in rt leg.   . Cataract 3 and 10/92    bilateral  . Hypertension     DR Terrilee Files  . Breast cancer 08/20/2011  . Aneurysm     Past Surgical History  Procedure Date  . Hernia repair 10/16/2006    [facility] - Dr Daphine Deutscher  . Appendectomy 1940  . Tonsillectomy 1944  . Cataract extraction 1992  . Eye surgery x 2 at [facility], told this was some form of ?lymphoma 1940, 38  . Mastectomy, partial 10/17/2011    Procedure: MASTECTOMY PARTIAL;  Surgeon: Currie Paris, MD;  Location: [facility] OR;  Service: General;  Laterality: Right;  needle guided  Tis , 3.8 cm high grade DCIS, negative SN; margins clear.  Allergies: Allergies  Allergen Reactions  . Contrast Media (Iodinated  Diagnostic Agents) Shortness Of Breath  . Iohexol Shortness Of Breath  . Penicillins Rash    Medications: Medications Prior to Admission  Medication Sig Dispense Refill  . amitriptyline (ELAVIL) 50 MG tablet Take 50 mg by mouth at bedtime.       Marland Kitchen amLODipine (NORVASC) 10 MG tablet Take 10 mg by mouth daily.       Marland Kitchen BONIVA 150 MG tablet Take 150 mg by mouth every 30 (thirty) days.       . calcium citrate-vitamin D (CITRACAL+D) 315-200 MG-UNIT per tablet Take 1 tablet by mouth 2 (two) times daily.       . cholecalciferol (VITAMIN D) 1000 UNITS tablet Take 1,000 Units by mouth daily. Vitamin D3      . ENABLEX 15 MG 24 hr tablet Take 15 mg by mouth daily.       Marland Kitchen HYDROcodone-acetaminophen (VICODIN) 5-500 MG per tablet Take 1 tablet by mouth every 8 (eight) hours as needed. For pain      . metoprolol succinate (TOPROL-XL) 25 MG 24 hr tablet Take 25 mg by mouth daily.        . Multiple Vitamin (MULTIVITAMIN) tablet Take 1 tablet by mouth daily.       . pravastatin (PRAVACHOL) 20 MG tablet Take 20 mg by mouth every evening.       Marland Kitchen PRILOSEC OTC 20 MG tablet Take 20 mg by mouth daily.       Marland Kitchen  SYNTHROID 100 MCG tablet Take 100 mcg by mouth daily.       Marland Kitchen warfarin (COUMADIN) 2.5 MG tablet Take 1.25-2.5 mg by mouth daily. Alternates days taking 1 tab one day then 0.5 tab the next       No current facility-administered medications on file as of 11/19/2011.    Social History:   reports that she has never smoked. She does not have any smokeless tobacco history on file. She reports that she does not drink alcohol or use illicit drugs.  Family History: Family History  Problem Relation Age of Onset  . Heart disease Mother   . Heart disease Maternal Uncle   . Heart disease Maternal Grandfather   no hx of breast or other cancers  Reproductive History  G0P0; menopause @57 , menarche 14   Review of Systems: Constitutional ROS: Fever no, Chills, Night Sweats, Anorexia, Pain no Cardiovascular ROS: no  chest pain or dyspnea on exertion Respiratory ROS: no cough, shortness of breath, or wheezing Neurological ROS: negative Dermatological ROS: negative ENT ROS: negative Gastrointestinal ROS: negative Genito-Urinary ROS: negative Hematological and Lymphatic ROS: negative Breast ROS: negative Musculoskeletal ROS: negative Remaining ROS negative.  Physical Exam: Blood pressure 128/81, pulse 67, temperature 98 F (36.7 C), height 4\' 10"  (1.473 m), weight 124 lb 4.8 oz (56.382 kg). General appearance: alert, cooperative and appears stated age, significant kyphoscoliosis Head: Normocephalic, without obvious abnormality, atraumatic Neck: no adenopathy, no carotid bruit, no JVD, supple, symmetrical, trachea midline and thyroid not enlarged, symmetric, no tenderness/mass/nodules Lymph nodes: Cervical, supraclavicular, and axillary nodes normal. Heart exam - S1, S2 normal, no murmur, no gallop, rate regular systolic murmur: early systolic 2/6, crescendo at 2nd left intercostal space abdomen is soft without significant tenderness, masses, organomegaly or guarding normal appearance, no masses or tenderness extremities normal, atraumatic, no cyanosis or edema Grossly normal Rt breast-scar healing well, lt breast-wnl; both axilla -nl. Lab Results: Lab Results  Component Value Date   WBC 7.8 11/19/2011   HGB 12.9 11/19/2011   HCT 37.9 11/19/2011   MCV 85.4 11/19/2011   PLT 185 11/19/2011     Chemistry      Component Value Date/Time   NA 137 11/19/2011 1036   K 4.4 11/19/2011 1036   CL 102 11/19/2011 1036   CO2 29 11/19/2011 1036   BUN 27* 11/19/2011 1036   CREATININE 0.83 11/19/2011 1036      Component Value Date/Time   CALCIUM 9.3 11/19/2011 1036   ALKPHOS 62 11/19/2011 1036   AST 21 11/19/2011 1036   ALT 21 11/19/2011 1036   BILITOT 0.3 11/19/2011 1036       Radiological Studies: As above  Impression and Plan:  Pleasant 76 year old woman with history of multiple other comorbid problems including  history of  DVTx 2,  as well as severe osteoporosis. She has taken Forteo in the past currently is on [place]. Her DVTs were 10 years apart the first time he took Coumadin for 6 months. Suspect about the cost are related to inactivity.  I. discussed options for her in regards to anti-estrogen therapy. Given that she has blood clots x2 she may need to be on Coumadin indefinitely. I do not think she has had a hypercoagulable workup it would be reasonanble  To do that in the future. Once again I suspect she may be a candidate for indefinite anticoagulation. Certainly if we're consider giving her tamoxifen anticoagulation long-term might be a good idea. Given her AI therapy may be fraught with some  difficulty given her severe osteoporosis. I did note a bone density test performed in 2009 which indicated T-scores of -3.5. We will check a vitamin D level before she returns as well. I will plan to see her again in followup after she has completed radiation therapy. At that time we'll make a final decision regarding her optimal anti-hormonal therapy. Once again in tamoxifen might be a better choice for her.   Pierce Crane MD  60 minutes was spent with the patient half that time was related to patient-related counseling       Annette Gannett, MD 11/20/2011, 12:05 AM

## 2011-11-21 ENCOUNTER — Ambulatory Visit
Admission: RE | Admit: 2011-11-21 | Discharge: 2011-11-21 | Disposition: A | Discharge status: Home / Self Care | Payer: Medicare Other | Source: Ambulatory Visit | Attending: Radiation Oncology | Admitting: Radiation Oncology

## 2011-11-21 ENCOUNTER — Encounter: Payer: Self-pay | Admitting: Radiation Oncology

## 2011-11-21 DIAGNOSIS — L589 Radiodermatitis, unspecified: Secondary | ICD-10-CM | POA: Insufficient documentation

## 2011-11-21 DIAGNOSIS — M810 Age-related osteoporosis without current pathological fracture: Secondary | ICD-10-CM | POA: Diagnosis not present

## 2011-11-21 DIAGNOSIS — Z91041 Radiographic dye allergy status: Secondary | ICD-10-CM | POA: Insufficient documentation

## 2011-11-21 DIAGNOSIS — C50919 Malignant neoplasm of unspecified site of unspecified female breast: Secondary | ICD-10-CM

## 2011-11-21 DIAGNOSIS — Z510 Encounter for antineoplastic radiation therapy: Secondary | ICD-10-CM | POA: Insufficient documentation

## 2011-11-21 DIAGNOSIS — D0590 Unspecified type of carcinoma in situ of unspecified breast: Secondary | ICD-10-CM | POA: Diagnosis not present

## 2011-11-21 DIAGNOSIS — I10 Essential (primary) hypertension: Secondary | ICD-10-CM | POA: Diagnosis not present

## 2011-11-21 DIAGNOSIS — C50419 Malignant neoplasm of upper-outer quadrant of unspecified female breast: Secondary | ICD-10-CM | POA: Diagnosis not present

## 2011-11-21 DIAGNOSIS — D0510 Intraductal carcinoma in situ of unspecified breast: Secondary | ICD-10-CM

## 2011-11-21 DIAGNOSIS — E7800 Pure hypercholesterolemia, unspecified: Secondary | ICD-10-CM | POA: Insufficient documentation

## 2011-11-21 DIAGNOSIS — Z79899 Other long term (current) drug therapy: Secondary | ICD-10-CM | POA: Diagnosis not present

## 2011-11-21 DIAGNOSIS — Y842 Radiological procedure and radiotherapy as the cause of abnormal reaction of the patient, or of later complication, without mention of misadventure at the time of the procedure: Secondary | ICD-10-CM | POA: Insufficient documentation

## 2011-11-21 DIAGNOSIS — Z170 Estrogen receptor positive status [ER+]: Secondary | ICD-10-CM | POA: Insufficient documentation

## 2011-11-21 NOTE — Progress Notes (Signed)
Please see the Nurse Progress Note in the MD Initial Consult Encounter for this patient. 

## 2011-11-26 NOTE — Progress Notes (Signed)
CC:   [name], M.D. [name], M.D. [name], M.D. [name], M.D., F.R.C.P.C.  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  REFERRING PHYSICIAN:  Cyndia Bent, MD.  HISTORY OF PRESENT ILLNESS:  [name] is a pleasant 76 year old female who was found to have a cluster of suspicious microcalcifications within the upper outer quadrant of the right breast on screening mammography. A stereotactic biopsy was recommended, and this did show high-grade DCIS which was ER and PR positive.  An MRI scan of the breast bilaterally was performed, and this showed a 4.1 x 1 x 3.2 cm ill-defined non-masslike linear enhancement in the upper outer quadrant of the right breast, compatible with the biopsy-proven neoplasm.  There is no evidence of additional breast disease and no abnormal appearing lymph nodes.  The patient proceeded to undergo a lumpectomy on 10/17/2011.  This did reveal DCIS, high grade, spanning at least 3.8 cm.  The margins were negative although the DCIS was felt to extend to within less than 0.1 cm from the posterior margin.  The operative note does note that the surgery was taken down to the chest wall.  One sentinel lymph node was performed, and this was negative for carcinoma, and there was 1 benign intramammary lymph node also evaluated.  The patient states that she has been healing well postoperatively.  She has discussed possible antihormonal treatment with Dr. Donnie Coffin, and I have been asked to see [name] for possible adjuvant radiation at this time.  PAST MEDICAL HISTORY:  GERD, hypercholesterolemia, osteoporosis, history of rib fractures, history of blood clots, bilateral cataracts, hypertension, aneurysm.  PAST SURGICAL HISTORY:  Hernia repair, appendectomy, tonsillectomy, cataract extraction.  Eye surgery x2 at [facility]  CURRENT MEDICATIONS:  Elavil, Norvasc,  Boniva, calcium, vitamin D, Enablex, Vicodin, Toprol-XL, multivitamin, Pravachol, Prilosec, Synthroid, Coumadin.  ALLERGIES:  CONTRAST MEDIA, PENICILLINS, IOHEXOL.  FAMILY HISTORY:  No known family history of malignancy per the patient.  SOCIAL HISTORY:  The patient is married.  She lives in [place]. The patient has no history of smoking.  She does not drink alcohol.  REVIEW OF SYSTEMS:  Fully reviewed and documented in the medical chart.  PHYSICAL EXAM:  Vital Signs:  Weight 125 pounds.  Blood pressure 110/72, pulse 73, respiratory rate 20, temperature 97.1.  General:  A well- developed female in no acute distress.  Alert and oriented x3.  HEENT: Normocephalic atraumatic.  Pupils are equal, round and reactive to light.  Extraocular movements intact.  Oral cavity clear.  Neck:  Supple without any lymphadenopathy.  Cardiovascular:  Regular rate and rhythm with a holosystolic murmur.  Respiratory:  Clear to auscultation.  GI: Abdomen soft, nontender, normal bowel sounds.  Extremities:  No edema present.  Breasts:  On breast exam, the patient is healing well with regards to the right breast and the axilla.  No suspicious findings and no axillary adenopathy present, and no suspicious findings on the left with respect to the left breast or axilla.  IMPRESSION AND PLAN:  [name] is a pleasant 75 year old female with a recent diagnosis of ductal carcinoma in situ of the right breast, which is estrogen-receptor and progesterone-receptor positive.  The patient is status post a lumpectomy and is an appropriate candidate to proceed with adjuvant radiotherapy at this time.  This will be followed by antihormonal treatment in all likelihood.  [name] and I discussed a typical 6-1/2-week course of treatment, and  all of her questions were answered.  She does understand the rationale in terms of a benefit in local control with radiation as well as the possible side effects.   The patient's pathology did reveal a close margin, but Dr. Tenna Child operative note does indicate that the surgery was taken down to the chest wall, and the final margins were negative.  This would, I believe, provide additional rationale for radiation treatment in this setting. Therefore, I would recommend adjuvant radiotherapy for her case, and she will be scheduled for a simulation in the near future, such that we can proceed with treatment planning.    ______________________________ Radene Gunning, M.D., Ph.D. JSM/MEDQ  D:  11/26/2011  T:  11/26/2011  Job:  [id number]

## 2011-11-27 ENCOUNTER — Ambulatory Visit (INDEPENDENT_AMBULATORY_CARE_PROVIDER_SITE_OTHER): Payer: Medicare Other | Admitting: Surgery

## 2011-11-27 ENCOUNTER — Encounter (INDEPENDENT_AMBULATORY_CARE_PROVIDER_SITE_OTHER): Payer: Self-pay | Admitting: Surgery

## 2011-11-27 ENCOUNTER — Encounter: Payer: Self-pay | Admitting: *Deleted

## 2011-11-27 VITALS — BP 114/78 | HR 66 | Temp 97.6°F | Resp 16 | Ht 59.25 in | Wt 124.0 lb

## 2011-11-27 DIAGNOSIS — C50919 Malignant neoplasm of unspecified site of unspecified female breast: Secondary | ICD-10-CM

## 2011-11-27 NOTE — Patient Instructions (Signed)
See me again about two or three weeks after completing radiation

## 2011-11-27 NOTE — Progress Notes (Signed)
KHARI LETT    [id number] 11/27/2011    01/05/1936   CC: Post op Right lumpectomy and SLN  HPI: The patient returns for post op follow-up. She underwent a NL Right lumpectomy with sentinel node on 10/17/2011. Over all she feels that she is doing well.   PE: The incision is healing nicely and there is no evidence of infection or hematoma.  DATA REVIEWED: Pathology report showed Hi grade DCIS close to posterior margin, but negative margin, receptor +, SLN negaitve  IMPRESSION: Patient doing well.   PLAN: She is planning to do radiation. She is unsure about anti-estrogen and asked me for a recommendation. She has a history of blood clots and issues with bone density, giving her relative contra-indications to anti-estrogens. She will think over options while she is doing radiation.

## 2011-11-27 NOTE — Progress Notes (Signed)
Mailed after appt letter to pt. 

## 2011-11-28 ENCOUNTER — Ambulatory Visit
Admission: RE | Admit: 2011-11-28 | Discharge: 2011-11-28 | Disposition: A | Discharge status: Home / Self Care | Payer: Medicare Other | Source: Ambulatory Visit | Attending: Radiation Oncology | Admitting: Radiation Oncology

## 2011-11-28 ENCOUNTER — Ambulatory Visit: Payer: Medicare Other | Admitting: Cardiothoracic Surgery

## 2011-11-28 DIAGNOSIS — Z510 Encounter for antineoplastic radiation therapy: Secondary | ICD-10-CM | POA: Diagnosis not present

## 2011-11-28 DIAGNOSIS — E7800 Pure hypercholesterolemia, unspecified: Secondary | ICD-10-CM | POA: Diagnosis not present

## 2011-11-28 DIAGNOSIS — M810 Age-related osteoporosis without current pathological fracture: Secondary | ICD-10-CM | POA: Diagnosis not present

## 2011-11-28 DIAGNOSIS — L589 Radiodermatitis, unspecified: Secondary | ICD-10-CM | POA: Diagnosis not present

## 2011-11-28 DIAGNOSIS — C50419 Malignant neoplasm of upper-outer quadrant of unspecified female breast: Secondary | ICD-10-CM | POA: Diagnosis not present

## 2011-11-28 DIAGNOSIS — Z170 Estrogen receptor positive status [ER+]: Secondary | ICD-10-CM | POA: Diagnosis not present

## 2011-11-28 DIAGNOSIS — D0590 Unspecified type of carcinoma in situ of unspecified breast: Secondary | ICD-10-CM | POA: Diagnosis not present

## 2011-11-28 NOTE — Progress Notes (Signed)
Met with patient to discuss RO billing.  Patient had no concerns today. 

## 2011-12-04 DIAGNOSIS — Z510 Encounter for antineoplastic radiation therapy: Secondary | ICD-10-CM | POA: Diagnosis not present

## 2011-12-04 DIAGNOSIS — C50419 Malignant neoplasm of upper-outer quadrant of unspecified female breast: Secondary | ICD-10-CM | POA: Diagnosis not present

## 2011-12-04 DIAGNOSIS — D0590 Unspecified type of carcinoma in situ of unspecified breast: Secondary | ICD-10-CM | POA: Diagnosis not present

## 2011-12-04 DIAGNOSIS — L589 Radiodermatitis, unspecified: Secondary | ICD-10-CM | POA: Diagnosis not present

## 2011-12-04 DIAGNOSIS — Z170 Estrogen receptor positive status [ER+]: Secondary | ICD-10-CM | POA: Diagnosis not present

## 2011-12-04 DIAGNOSIS — E7800 Pure hypercholesterolemia, unspecified: Secondary | ICD-10-CM | POA: Diagnosis not present

## 2011-12-04 DIAGNOSIS — M810 Age-related osteoporosis without current pathological fracture: Secondary | ICD-10-CM | POA: Diagnosis not present

## 2011-12-05 ENCOUNTER — Ambulatory Visit
Admission: RE | Admit: 2011-12-05 | Discharge: 2011-12-05 | Disposition: A | Discharge status: Home / Self Care | Payer: Medicare Other | Source: Ambulatory Visit | Attending: Radiation Oncology | Admitting: Radiation Oncology

## 2011-12-05 DIAGNOSIS — Z170 Estrogen receptor positive status [ER+]: Secondary | ICD-10-CM | POA: Diagnosis not present

## 2011-12-05 DIAGNOSIS — C50419 Malignant neoplasm of upper-outer quadrant of unspecified female breast: Secondary | ICD-10-CM | POA: Diagnosis not present

## 2011-12-05 DIAGNOSIS — Z510 Encounter for antineoplastic radiation therapy: Secondary | ICD-10-CM | POA: Diagnosis not present

## 2011-12-05 DIAGNOSIS — L589 Radiodermatitis, unspecified: Secondary | ICD-10-CM | POA: Diagnosis not present

## 2011-12-05 DIAGNOSIS — E7800 Pure hypercholesterolemia, unspecified: Secondary | ICD-10-CM | POA: Diagnosis not present

## 2011-12-05 DIAGNOSIS — M810 Age-related osteoporosis without current pathological fracture: Secondary | ICD-10-CM | POA: Diagnosis not present

## 2011-12-05 DIAGNOSIS — D0590 Unspecified type of carcinoma in situ of unspecified breast: Secondary | ICD-10-CM | POA: Diagnosis not present

## 2011-12-06 ENCOUNTER — Ambulatory Visit
Admission: RE | Admit: 2011-12-06 | Discharge: 2011-12-06 | Disposition: A | Discharge status: Home / Self Care | Payer: Medicare Other | Source: Ambulatory Visit | Attending: Radiation Oncology | Admitting: Radiation Oncology

## 2011-12-06 VITALS — Wt 126.0 lb

## 2011-12-06 DIAGNOSIS — Z510 Encounter for antineoplastic radiation therapy: Secondary | ICD-10-CM | POA: Diagnosis not present

## 2011-12-06 DIAGNOSIS — M810 Age-related osteoporosis without current pathological fracture: Secondary | ICD-10-CM | POA: Diagnosis not present

## 2011-12-06 DIAGNOSIS — E7800 Pure hypercholesterolemia, unspecified: Secondary | ICD-10-CM | POA: Diagnosis not present

## 2011-12-06 DIAGNOSIS — C50419 Malignant neoplasm of upper-outer quadrant of unspecified female breast: Secondary | ICD-10-CM | POA: Diagnosis not present

## 2011-12-06 DIAGNOSIS — C50919 Malignant neoplasm of unspecified site of unspecified female breast: Secondary | ICD-10-CM

## 2011-12-06 DIAGNOSIS — D0590 Unspecified type of carcinoma in situ of unspecified breast: Secondary | ICD-10-CM | POA: Diagnosis not present

## 2011-12-06 DIAGNOSIS — L589 Radiodermatitis, unspecified: Secondary | ICD-10-CM | POA: Diagnosis not present

## 2011-12-06 DIAGNOSIS — Z170 Estrogen receptor positive status [ER+]: Secondary | ICD-10-CM | POA: Diagnosis not present

## 2011-12-06 MED ORDER — ALRA NON-METALLIC DEODORANT (RAD-ONC)
1.0000 "application " | Freq: Once | TOPICAL | Status: AC
  Administered 2011-12-06: 1 via TOPICAL

## 2011-12-06 MED ORDER — RADIAPLEXRX EX GEL
Freq: Once | CUTANEOUS | Status: AC
  Administered 2011-12-06: 11:00:00 via TOPICAL

## 2011-12-06 NOTE — Progress Notes (Signed)
Post sim done; all questions answered. Gave pt "Radiation and You" booklet.

## 2011-12-06 NOTE — Procedures (Signed)
DIAGNOSIS:  Breast cancer.  NARRATIVE:  [name] presented for port films prior to beginning her course of radiation treatment to the breast.  The isocenter was accurately placed and each of the parent fields appropriately delineated the target treatment region.  There was one1 issue with respect to a reduced field which did not appear to be aligned with the proper control field.  I have requested clarification and re-imaging of this field.  We will perform this and otherwise proceed with her treatment as planned.    ______________________________ Radene Gunning, M.D., Ph.D. JSM/MEDQ  D:  12/06/2011  T:  12/06/2011  Job:  [id number]

## 2011-12-06 NOTE — Progress Notes (Signed)
[facility] Radiation Oncology Weekly Treatment Note    Name: [name] Date: 12/06/2011 MRN: [id number] DOB: 10/09/1936  Status: outpatient    Current dose: 180  Current fraction:1  Planned dose:6240  Planned fraction:34   MEDICATIONS: Current Outpatient Prescriptions  Medication Sig Dispense Refill  . amitriptyline (ELAVIL) 50 MG tablet Take 50 mg by mouth at bedtime.       Marland Kitchen amLODipine (NORVASC) 10 MG tablet Take 10 mg by mouth daily.       Marland Kitchen BONIVA 150 MG tablet Take 150 mg by mouth every 30 (thirty) days.       . calcium citrate-vitamin D (CITRACAL+D) 315-200 MG-UNIT per tablet Take 1 tablet by mouth 2 (two) times daily.       . cholecalciferol (VITAMIN D) 1000 UNITS tablet Take 1,000 Units by mouth daily. Vitamin D3      . ENABLEX 15 MG 24 hr tablet Take 15 mg by mouth daily.       Marland Kitchen HYDROcodone-acetaminophen (VICODIN) 5-500 MG per tablet Take 1 tablet by mouth every 8 (eight) hours as needed. For pain      . metoprolol succinate (TOPROL-XL) 25 MG 24 hr tablet Take 25 mg by mouth daily.        . Multiple Vitamin (MULTIVITAMIN) tablet Take 1 tablet by mouth daily.       . pravastatin (PRAVACHOL) 20 MG tablet Take 20 mg by mouth every evening.       Marland Kitchen PRILOSEC OTC 20 MG tablet Take 20 mg by mouth daily.       Marland Kitchen SYNTHROID 100 MCG tablet Take 100 mcg by mouth daily.       Marland Kitchen warfarin (COUMADIN) 2.5 MG tablet Take 1.25-2.5 mg by mouth daily. Alternates days taking 1 tab one day then 0.5 tab the next       Current Facility-Administered Medications  Medication Dose Route Frequency Provider Last Rate Last Dose  . hyaluronate sodium (RADIAPLEXRX) gel   Topical Once Jonna Coup, MD      . non-metallic deodorant Thornton Papas) 1 application  1 application Topical Once Jonna Coup, MD         ALLERGIES: Contrast media; Iohexol; and Penicillins   LABORATORY DATA:  Lab Results  Component Value Date   WBC 7.8 11/19/2011   HGB 12.9 11/19/2011   HCT 37.9  11/19/2011   MCV 85.4 11/19/2011   PLT 185 11/19/2011   Lab Results  Component Value Date   NA 137 11/19/2011   K 4.4 11/19/2011   CL 102 11/19/2011   CO2 29 11/19/2011   Lab Results  Component Value Date   ALT 21 11/19/2011   AST 21 11/19/2011   ALKPHOS 62 11/19/2011   BILITOT 0.3 11/19/2011      NARRATIVE: [name] was seen today for weekly treatment management. The chart was checked and port films images were reviewed. Pt doing well. No problems with treatment so far.  PHYSICAL EXAMINATION: weight is 126 lb (57.153 kg).       ASSESSMENT: Patient tolerating treatments well.    PLAN: Continue treatment as planned.

## 2011-12-06 NOTE — Progress Notes (Signed)
DIAGNOSIS:  Ductal carcinoma in situ of the right breast.  NARRATIVE:  [name] presented for simulation for her upcoming course of adjuvant radiotherapy to the right breast.  The patient was placed in a prone position which was necessary for the patient's stability and comfort.  The patient then proceeded to undergo a CT scan through the breast area and an isocenter was placed within the right breast.  [name] will initially be planned to receive 50.4 Gy with respect to whole breast radiation.  To accomplish this, 2 customized blocks were designed and these correspond to medial and lateral tangent fields.  A 4- planning technique will be evaluated to determine if this approach significantly improves the dose characteristics of the plan.  A complex isodose plan is requested, therefore, for review to ensure that the breast is adequately covered and the dose characteristics of the plan are satisfactory.  It is anticipated that the patient will then receive a 12 Gy boost in 6 fractions to yield a final total dose of 62.4 Gy.    ______________________________ Radene Gunning, M.D., Ph.D. JSM/MEDQ  D:  12/06/2011  T:  12/06/2011  Job:  [id number]

## 2011-12-06 NOTE — Progress Notes (Signed)
Encounter addended by: Glennie Hawk, RN on: 12/06/2011  1:58 PM<BR>     Documentation filed: Inpatient MAR

## 2011-12-07 ENCOUNTER — Ambulatory Visit
Admission: RE | Admit: 2011-12-07 | Discharge: 2011-12-07 | Disposition: A | Discharge status: Home / Self Care | Payer: Medicare Other | Source: Ambulatory Visit | Attending: Radiation Oncology | Admitting: Radiation Oncology

## 2011-12-07 DIAGNOSIS — D0590 Unspecified type of carcinoma in situ of unspecified breast: Secondary | ICD-10-CM | POA: Diagnosis not present

## 2011-12-07 DIAGNOSIS — E7800 Pure hypercholesterolemia, unspecified: Secondary | ICD-10-CM | POA: Diagnosis not present

## 2011-12-07 DIAGNOSIS — Z170 Estrogen receptor positive status [ER+]: Secondary | ICD-10-CM | POA: Diagnosis not present

## 2011-12-07 DIAGNOSIS — M810 Age-related osteoporosis without current pathological fracture: Secondary | ICD-10-CM | POA: Diagnosis not present

## 2011-12-07 DIAGNOSIS — L589 Radiodermatitis, unspecified: Secondary | ICD-10-CM | POA: Diagnosis not present

## 2011-12-07 DIAGNOSIS — Z510 Encounter for antineoplastic radiation therapy: Secondary | ICD-10-CM | POA: Diagnosis not present

## 2011-12-10 ENCOUNTER — Ambulatory Visit
Admission: RE | Admit: 2011-12-10 | Discharge: 2011-12-10 | Disposition: A | Discharge status: Home / Self Care | Payer: Medicare Other | Source: Ambulatory Visit | Attending: Radiation Oncology | Admitting: Radiation Oncology

## 2011-12-10 DIAGNOSIS — Z170 Estrogen receptor positive status [ER+]: Secondary | ICD-10-CM | POA: Diagnosis not present

## 2011-12-10 DIAGNOSIS — Z510 Encounter for antineoplastic radiation therapy: Secondary | ICD-10-CM | POA: Diagnosis not present

## 2011-12-10 DIAGNOSIS — E7800 Pure hypercholesterolemia, unspecified: Secondary | ICD-10-CM | POA: Diagnosis not present

## 2011-12-10 DIAGNOSIS — D0590 Unspecified type of carcinoma in situ of unspecified breast: Secondary | ICD-10-CM | POA: Diagnosis not present

## 2011-12-10 DIAGNOSIS — L589 Radiodermatitis, unspecified: Secondary | ICD-10-CM | POA: Diagnosis not present

## 2011-12-10 DIAGNOSIS — M810 Age-related osteoporosis without current pathological fracture: Secondary | ICD-10-CM | POA: Diagnosis not present

## 2011-12-11 ENCOUNTER — Ambulatory Visit
Admission: RE | Admit: 2011-12-11 | Discharge: 2011-12-11 | Disposition: A | Discharge status: Home / Self Care | Payer: Medicare Other | Source: Ambulatory Visit | Attending: Radiation Oncology | Admitting: Radiation Oncology

## 2011-12-11 DIAGNOSIS — L589 Radiodermatitis, unspecified: Secondary | ICD-10-CM | POA: Diagnosis not present

## 2011-12-11 DIAGNOSIS — M810 Age-related osteoporosis without current pathological fracture: Secondary | ICD-10-CM | POA: Diagnosis not present

## 2011-12-11 DIAGNOSIS — D0590 Unspecified type of carcinoma in situ of unspecified breast: Secondary | ICD-10-CM | POA: Diagnosis not present

## 2011-12-11 DIAGNOSIS — E7800 Pure hypercholesterolemia, unspecified: Secondary | ICD-10-CM | POA: Diagnosis not present

## 2011-12-11 DIAGNOSIS — Z510 Encounter for antineoplastic radiation therapy: Secondary | ICD-10-CM | POA: Diagnosis not present

## 2011-12-11 DIAGNOSIS — Z170 Estrogen receptor positive status [ER+]: Secondary | ICD-10-CM | POA: Diagnosis not present

## 2011-12-12 ENCOUNTER — Ambulatory Visit
Admission: RE | Admit: 2011-12-12 | Discharge: 2011-12-12 | Disposition: A | Discharge status: Home / Self Care | Payer: Medicare Other | Source: Ambulatory Visit | Attending: Radiation Oncology | Admitting: Radiation Oncology

## 2011-12-12 DIAGNOSIS — Z170 Estrogen receptor positive status [ER+]: Secondary | ICD-10-CM | POA: Diagnosis not present

## 2011-12-12 DIAGNOSIS — Z510 Encounter for antineoplastic radiation therapy: Secondary | ICD-10-CM | POA: Diagnosis not present

## 2011-12-12 DIAGNOSIS — M810 Age-related osteoporosis without current pathological fracture: Secondary | ICD-10-CM | POA: Diagnosis not present

## 2011-12-12 DIAGNOSIS — D0590 Unspecified type of carcinoma in situ of unspecified breast: Secondary | ICD-10-CM | POA: Diagnosis not present

## 2011-12-12 DIAGNOSIS — L589 Radiodermatitis, unspecified: Secondary | ICD-10-CM | POA: Diagnosis not present

## 2011-12-12 DIAGNOSIS — E7800 Pure hypercholesterolemia, unspecified: Secondary | ICD-10-CM | POA: Diagnosis not present

## 2011-12-13 ENCOUNTER — Encounter: Payer: Self-pay | Admitting: Radiation Oncology

## 2011-12-13 ENCOUNTER — Ambulatory Visit
Admission: RE | Admit: 2011-12-13 | Discharge: 2011-12-13 | Disposition: A | Discharge status: Home / Self Care | Payer: Medicare Other | Source: Ambulatory Visit | Attending: Radiation Oncology | Admitting: Radiation Oncology

## 2011-12-13 VITALS — Wt 125.6 lb

## 2011-12-13 DIAGNOSIS — D0590 Unspecified type of carcinoma in situ of unspecified breast: Secondary | ICD-10-CM | POA: Diagnosis not present

## 2011-12-13 DIAGNOSIS — Z170 Estrogen receptor positive status [ER+]: Secondary | ICD-10-CM | POA: Diagnosis not present

## 2011-12-13 DIAGNOSIS — E7800 Pure hypercholesterolemia, unspecified: Secondary | ICD-10-CM | POA: Diagnosis not present

## 2011-12-13 DIAGNOSIS — C50419 Malignant neoplasm of upper-outer quadrant of unspecified female breast: Secondary | ICD-10-CM | POA: Diagnosis not present

## 2011-12-13 DIAGNOSIS — Z7901 Long term (current) use of anticoagulants: Secondary | ICD-10-CM | POA: Diagnosis not present

## 2011-12-13 DIAGNOSIS — M810 Age-related osteoporosis without current pathological fracture: Secondary | ICD-10-CM | POA: Diagnosis not present

## 2011-12-13 DIAGNOSIS — Z510 Encounter for antineoplastic radiation therapy: Secondary | ICD-10-CM | POA: Diagnosis not present

## 2011-12-13 DIAGNOSIS — L589 Radiodermatitis, unspecified: Secondary | ICD-10-CM | POA: Diagnosis not present

## 2011-12-13 NOTE — Progress Notes (Signed)
[facility] Radiation Oncology Weekly Treatment Note    Name: LETECIA [name] Date: 12/13/2011 MRN: [id number] DOB: [birth year]  Status: outpatient    Current dose: 1080  Current fraction:6  Planned dose:6240  Planned fraction:34   MEDICATIONS: Current Outpatient Prescriptions  Medication Sig Dispense Refill  . amitriptyline (ELAVIL) 50 MG tablet Take 50 mg by mouth at bedtime.       Marland Kitchen amLODipine (NORVASC) 10 MG tablet Take 10 mg by mouth daily.       Marland Kitchen BONIVA 150 MG tablet Take 150 mg by mouth every 30 (thirty) days.       . calcium citrate-vitamin D (CITRACAL+D) 315-200 MG-UNIT per tablet Take 1 tablet by mouth 2 (two) times daily.       . cholecalciferol (VITAMIN D) 1000 UNITS tablet Take 1,000 Units by mouth daily. Vitamin D3      . ENABLEX 15 MG 24 hr tablet Take 15 mg by mouth daily.       Marland Kitchen HYDROcodone-acetaminophen (VICODIN) 5-500 MG per tablet Take 1 tablet by mouth every 8 (eight) hours as needed. For pain      . metoprolol succinate (TOPROL-XL) 25 MG 24 hr tablet Take 25 mg by mouth daily.        . Multiple Vitamin (MULTIVITAMIN) tablet Take 1 tablet by mouth daily.       . pravastatin (PRAVACHOL) 20 MG tablet Take 20 mg by mouth every evening.       Marland Kitchen PRILOSEC OTC 20 MG tablet Take 20 mg by mouth daily.       Marland Kitchen SYNTHROID 100 MCG tablet Take 100 mcg by mouth daily.       Marland Kitchen warfarin (COUMADIN) 2.5 MG tablet Take 1.25-2.5 mg by mouth daily. Alternates days taking 1 tab one day then 0.5 tab the next         ALLERGIES: Contrast media; Iohexol; and Penicillins   LABORATORY DATA:  Lab Results  Component Value Date   WBC 7.8 11/19/2011   HGB 12.9 11/19/2011   HCT 37.9 11/19/2011   MCV 85.4 11/19/2011   PLT 185 11/19/2011   Lab Results  Component Value Date   NA 137 11/19/2011   K 4.4 11/19/2011   CL 102 11/19/2011   CO2 29 11/19/2011   Lab Results  Component Value Date   ALT 21 11/19/2011   AST 21 11/19/2011   ALKPHOS 62 11/19/2011   BILITOT 0.3 11/19/2011       NARRATIVE: [name] was seen today for weekly treatment management. The chart was checked and port films images were reviewed. Pt is doing well. Shorter treatment today. No complaints.  PHYSICAL EXAMINATION: weight is 125 lb 9.6 oz (56.972 kg).    Minimal erythema   ASSESSMENT: Patient tolerating treatments well.    PLAN: Continue treatment as planned.

## 2011-12-13 NOTE — Progress Notes (Signed)
Pt has no c/o today 

## 2011-12-14 ENCOUNTER — Ambulatory Visit
Admission: RE | Admit: 2011-12-14 | Discharge: 2011-12-14 | Disposition: A | Discharge status: Home / Self Care | Payer: Medicare Other | Source: Ambulatory Visit | Attending: Radiation Oncology | Admitting: Radiation Oncology

## 2011-12-14 DIAGNOSIS — E7800 Pure hypercholesterolemia, unspecified: Secondary | ICD-10-CM | POA: Diagnosis not present

## 2011-12-14 DIAGNOSIS — M810 Age-related osteoporosis without current pathological fracture: Secondary | ICD-10-CM | POA: Diagnosis not present

## 2011-12-14 DIAGNOSIS — L589 Radiodermatitis, unspecified: Secondary | ICD-10-CM | POA: Diagnosis not present

## 2011-12-14 DIAGNOSIS — D0590 Unspecified type of carcinoma in situ of unspecified breast: Secondary | ICD-10-CM | POA: Diagnosis not present

## 2011-12-14 DIAGNOSIS — Z170 Estrogen receptor positive status [ER+]: Secondary | ICD-10-CM | POA: Diagnosis not present

## 2011-12-14 DIAGNOSIS — Z510 Encounter for antineoplastic radiation therapy: Secondary | ICD-10-CM | POA: Diagnosis not present

## 2011-12-17 ENCOUNTER — Ambulatory Visit
Admission: RE | Admit: 2011-12-17 | Discharge: 2011-12-17 | Disposition: A | Discharge status: Home / Self Care | Payer: Medicare Other | Source: Ambulatory Visit | Attending: Radiation Oncology | Admitting: Radiation Oncology

## 2011-12-17 DIAGNOSIS — L589 Radiodermatitis, unspecified: Secondary | ICD-10-CM | POA: Diagnosis not present

## 2011-12-17 DIAGNOSIS — M810 Age-related osteoporosis without current pathological fracture: Secondary | ICD-10-CM | POA: Diagnosis not present

## 2011-12-17 DIAGNOSIS — Z510 Encounter for antineoplastic radiation therapy: Secondary | ICD-10-CM | POA: Diagnosis not present

## 2011-12-17 DIAGNOSIS — E7800 Pure hypercholesterolemia, unspecified: Secondary | ICD-10-CM | POA: Diagnosis not present

## 2011-12-17 DIAGNOSIS — D0590 Unspecified type of carcinoma in situ of unspecified breast: Secondary | ICD-10-CM | POA: Diagnosis not present

## 2011-12-17 DIAGNOSIS — Z170 Estrogen receptor positive status [ER+]: Secondary | ICD-10-CM | POA: Diagnosis not present

## 2011-12-18 ENCOUNTER — Ambulatory Visit
Admission: RE | Admit: 2011-12-18 | Discharge: 2011-12-18 | Disposition: A | Discharge status: Home / Self Care | Payer: Medicare Other | Source: Ambulatory Visit | Attending: Radiation Oncology | Admitting: Radiation Oncology

## 2011-12-18 DIAGNOSIS — L589 Radiodermatitis, unspecified: Secondary | ICD-10-CM | POA: Diagnosis not present

## 2011-12-18 DIAGNOSIS — M810 Age-related osteoporosis without current pathological fracture: Secondary | ICD-10-CM | POA: Diagnosis not present

## 2011-12-18 DIAGNOSIS — Z510 Encounter for antineoplastic radiation therapy: Secondary | ICD-10-CM | POA: Diagnosis not present

## 2011-12-18 DIAGNOSIS — D0590 Unspecified type of carcinoma in situ of unspecified breast: Secondary | ICD-10-CM | POA: Diagnosis not present

## 2011-12-18 DIAGNOSIS — Z170 Estrogen receptor positive status [ER+]: Secondary | ICD-10-CM | POA: Diagnosis not present

## 2011-12-18 DIAGNOSIS — E7800 Pure hypercholesterolemia, unspecified: Secondary | ICD-10-CM | POA: Diagnosis not present

## 2011-12-19 ENCOUNTER — Ambulatory Visit
Admission: RE | Admit: 2011-12-19 | Discharge: 2011-12-19 | Disposition: A | Discharge status: Home / Self Care | Payer: Medicare Other | Source: Ambulatory Visit | Attending: Radiation Oncology | Admitting: Radiation Oncology

## 2011-12-19 DIAGNOSIS — L589 Radiodermatitis, unspecified: Secondary | ICD-10-CM | POA: Diagnosis not present

## 2011-12-19 DIAGNOSIS — E7800 Pure hypercholesterolemia, unspecified: Secondary | ICD-10-CM | POA: Diagnosis not present

## 2011-12-19 DIAGNOSIS — Z510 Encounter for antineoplastic radiation therapy: Secondary | ICD-10-CM | POA: Diagnosis not present

## 2011-12-19 DIAGNOSIS — D0590 Unspecified type of carcinoma in situ of unspecified breast: Secondary | ICD-10-CM | POA: Diagnosis not present

## 2011-12-19 DIAGNOSIS — M810 Age-related osteoporosis without current pathological fracture: Secondary | ICD-10-CM | POA: Diagnosis not present

## 2011-12-19 DIAGNOSIS — Z170 Estrogen receptor positive status [ER+]: Secondary | ICD-10-CM | POA: Diagnosis not present

## 2011-12-20 ENCOUNTER — Ambulatory Visit
Admission: RE | Admit: 2011-12-20 | Discharge: 2011-12-20 | Disposition: A | Discharge status: Home / Self Care | Payer: Medicare Other | Source: Ambulatory Visit | Attending: Radiation Oncology | Admitting: Radiation Oncology

## 2011-12-20 ENCOUNTER — Encounter: Payer: Self-pay | Admitting: Radiation Oncology

## 2011-12-20 VITALS — Wt 124.9 lb

## 2011-12-20 DIAGNOSIS — M810 Age-related osteoporosis without current pathological fracture: Secondary | ICD-10-CM | POA: Diagnosis not present

## 2011-12-20 DIAGNOSIS — C50419 Malignant neoplasm of upper-outer quadrant of unspecified female breast: Secondary | ICD-10-CM

## 2011-12-20 DIAGNOSIS — D0590 Unspecified type of carcinoma in situ of unspecified breast: Secondary | ICD-10-CM | POA: Diagnosis not present

## 2011-12-20 DIAGNOSIS — L589 Radiodermatitis, unspecified: Secondary | ICD-10-CM | POA: Diagnosis not present

## 2011-12-20 DIAGNOSIS — Z170 Estrogen receptor positive status [ER+]: Secondary | ICD-10-CM | POA: Diagnosis not present

## 2011-12-20 DIAGNOSIS — E7800 Pure hypercholesterolemia, unspecified: Secondary | ICD-10-CM | POA: Diagnosis not present

## 2011-12-20 DIAGNOSIS — Z510 Encounter for antineoplastic radiation therapy: Secondary | ICD-10-CM | POA: Diagnosis not present

## 2011-12-20 NOTE — Progress Notes (Signed)
Pt's mid chest red but states due to prone position for tx. Her R breast shows no signs of pinkness due to tx at this point.

## 2011-12-20 NOTE — Progress Notes (Signed)
[facility] Radiation Oncology Weekly Treatment Note    Name: [name] Date: 12/20/2011 MRN: [id number] DOB: 08-28-1936  Status: outpatient    Current dose: 1980  Current fraction:11  Planned dose:6240  Planned fraction:34   MEDICATIONS: Current Outpatient Prescriptions  Medication Sig Dispense Refill  . amitriptyline (ELAVIL) 50 MG tablet Take 50 mg by mouth at bedtime.       Marland Kitchen amLODipine (NORVASC) 10 MG tablet Take 10 mg by mouth daily.       Marland Kitchen BONIVA 150 MG tablet Take 150 mg by mouth every 30 (thirty) days.       . calcium citrate-vitamin D (CITRACAL+D) 315-200 MG-UNIT per tablet Take 1 tablet by mouth 2 (two) times daily.       . cholecalciferol (VITAMIN D) 1000 UNITS tablet Take 1,000 Units by mouth daily. Vitamin D3      . ENABLEX 15 MG 24 hr tablet Take 15 mg by mouth daily.       Marland Kitchen HYDROcodone-acetaminophen (VICODIN) 5-500 MG per tablet Take 1 tablet by mouth every 8 (eight) hours as needed. For pain      . metoprolol succinate (TOPROL-XL) 25 MG 24 hr tablet Take 25 mg by mouth daily.        . Multiple Vitamin (MULTIVITAMIN) tablet Take 1 tablet by mouth daily.       . pravastatin (PRAVACHOL) 20 MG tablet Take 20 mg by mouth every evening.       Marland Kitchen PRILOSEC OTC 20 MG tablet Take 20 mg by mouth daily.       Marland Kitchen SYNTHROID 100 MCG tablet Take 100 mcg by mouth daily.       Marland Kitchen warfarin (COUMADIN) 2.5 MG tablet Take 1.25-2.5 mg by mouth daily. Alternates days taking 1 tab one day then 0.5 tab the next         ALLERGIES: Contrast media; Iohexol; and Penicillins   LABORATORY DATA:  Lab Results  Component Value Date   WBC 7.8 11/19/2011   HGB 12.9 11/19/2011   HCT 37.9 11/19/2011   MCV 85.4 11/19/2011   PLT 185 11/19/2011   Lab Results  Component Value Date   NA 137 11/19/2011   K 4.4 11/19/2011   CL 102 11/19/2011   CO2 29 11/19/2011   Lab Results  Component Value Date   ALT 21 11/19/2011   AST 21 11/19/2011   ALKPHOS 62 11/19/2011   BILITOT 0.3 11/19/2011       NARRATIVE: [name] was seen today for weekly treatment management. The chart was checked and port films images were reviewed. Pt doing well. No complaints.  PHYSICAL EXAMINATION: weight is 124 lb 14.4 oz (56.654 kg).    Skin red at sternum - treatment area shows minimal change   ASSESSMENT: Patient tolerating treatments well.    PLAN: Continue treatment as planned.

## 2011-12-21 ENCOUNTER — Ambulatory Visit
Admission: RE | Admit: 2011-12-21 | Discharge: 2011-12-21 | Disposition: A | Discharge status: Home / Self Care | Payer: Medicare Other | Source: Ambulatory Visit | Attending: Radiation Oncology | Admitting: Radiation Oncology

## 2011-12-21 DIAGNOSIS — D0590 Unspecified type of carcinoma in situ of unspecified breast: Secondary | ICD-10-CM | POA: Diagnosis not present

## 2011-12-21 DIAGNOSIS — E7800 Pure hypercholesterolemia, unspecified: Secondary | ICD-10-CM | POA: Diagnosis not present

## 2011-12-21 DIAGNOSIS — Z510 Encounter for antineoplastic radiation therapy: Secondary | ICD-10-CM | POA: Diagnosis not present

## 2011-12-21 DIAGNOSIS — M810 Age-related osteoporosis without current pathological fracture: Secondary | ICD-10-CM | POA: Diagnosis not present

## 2011-12-21 DIAGNOSIS — Z170 Estrogen receptor positive status [ER+]: Secondary | ICD-10-CM | POA: Diagnosis not present

## 2011-12-21 DIAGNOSIS — L589 Radiodermatitis, unspecified: Secondary | ICD-10-CM | POA: Diagnosis not present

## 2011-12-24 ENCOUNTER — Ambulatory Visit
Admission: RE | Admit: 2011-12-24 | Discharge: 2011-12-24 | Disposition: A | Discharge status: Home / Self Care | Payer: Medicare Other | Source: Ambulatory Visit | Attending: Radiation Oncology | Admitting: Radiation Oncology

## 2011-12-24 DIAGNOSIS — E7800 Pure hypercholesterolemia, unspecified: Secondary | ICD-10-CM | POA: Diagnosis not present

## 2011-12-24 DIAGNOSIS — D0590 Unspecified type of carcinoma in situ of unspecified breast: Secondary | ICD-10-CM | POA: Diagnosis not present

## 2011-12-24 DIAGNOSIS — Z170 Estrogen receptor positive status [ER+]: Secondary | ICD-10-CM | POA: Diagnosis not present

## 2011-12-24 DIAGNOSIS — M810 Age-related osteoporosis without current pathological fracture: Secondary | ICD-10-CM | POA: Diagnosis not present

## 2011-12-24 DIAGNOSIS — Z510 Encounter for antineoplastic radiation therapy: Secondary | ICD-10-CM | POA: Diagnosis not present

## 2011-12-24 DIAGNOSIS — L589 Radiodermatitis, unspecified: Secondary | ICD-10-CM | POA: Diagnosis not present

## 2011-12-25 ENCOUNTER — Ambulatory Visit
Admission: RE | Admit: 2011-12-25 | Discharge: 2011-12-25 | Disposition: A | Discharge status: Home / Self Care | Payer: Medicare Other | Source: Ambulatory Visit | Attending: Radiation Oncology | Admitting: Radiation Oncology

## 2011-12-25 DIAGNOSIS — L589 Radiodermatitis, unspecified: Secondary | ICD-10-CM | POA: Diagnosis not present

## 2011-12-25 DIAGNOSIS — D0590 Unspecified type of carcinoma in situ of unspecified breast: Secondary | ICD-10-CM | POA: Diagnosis not present

## 2011-12-25 DIAGNOSIS — E7800 Pure hypercholesterolemia, unspecified: Secondary | ICD-10-CM | POA: Diagnosis not present

## 2011-12-25 DIAGNOSIS — Z510 Encounter for antineoplastic radiation therapy: Secondary | ICD-10-CM | POA: Diagnosis not present

## 2011-12-25 DIAGNOSIS — Z170 Estrogen receptor positive status [ER+]: Secondary | ICD-10-CM | POA: Diagnosis not present

## 2011-12-25 DIAGNOSIS — M810 Age-related osteoporosis without current pathological fracture: Secondary | ICD-10-CM | POA: Diagnosis not present

## 2011-12-26 ENCOUNTER — Ambulatory Visit
Admission: RE | Admit: 2011-12-26 | Discharge: 2011-12-26 | Disposition: A | Discharge status: Home / Self Care | Payer: Medicare Other | Source: Ambulatory Visit | Attending: Radiation Oncology | Admitting: Radiation Oncology

## 2011-12-26 DIAGNOSIS — M810 Age-related osteoporosis without current pathological fracture: Secondary | ICD-10-CM | POA: Diagnosis not present

## 2011-12-26 DIAGNOSIS — Z170 Estrogen receptor positive status [ER+]: Secondary | ICD-10-CM | POA: Diagnosis not present

## 2011-12-26 DIAGNOSIS — Z510 Encounter for antineoplastic radiation therapy: Secondary | ICD-10-CM | POA: Diagnosis not present

## 2011-12-26 DIAGNOSIS — D0590 Unspecified type of carcinoma in situ of unspecified breast: Secondary | ICD-10-CM | POA: Diagnosis not present

## 2011-12-26 DIAGNOSIS — E7800 Pure hypercholesterolemia, unspecified: Secondary | ICD-10-CM | POA: Diagnosis not present

## 2011-12-26 DIAGNOSIS — L589 Radiodermatitis, unspecified: Secondary | ICD-10-CM | POA: Diagnosis not present

## 2011-12-27 ENCOUNTER — Ambulatory Visit
Admission: RE | Admit: 2011-12-27 | Discharge: 2011-12-27 | Disposition: A | Discharge status: Home / Self Care | Payer: Medicare Other | Source: Ambulatory Visit | Attending: Radiation Oncology | Admitting: Radiation Oncology

## 2011-12-27 ENCOUNTER — Encounter: Payer: Self-pay | Admitting: Radiation Oncology

## 2011-12-27 VITALS — Wt 121.8 lb

## 2011-12-27 DIAGNOSIS — Z510 Encounter for antineoplastic radiation therapy: Secondary | ICD-10-CM | POA: Diagnosis not present

## 2011-12-27 DIAGNOSIS — Z170 Estrogen receptor positive status [ER+]: Secondary | ICD-10-CM | POA: Diagnosis not present

## 2011-12-27 DIAGNOSIS — M810 Age-related osteoporosis without current pathological fracture: Secondary | ICD-10-CM | POA: Diagnosis not present

## 2011-12-27 DIAGNOSIS — E7800 Pure hypercholesterolemia, unspecified: Secondary | ICD-10-CM | POA: Diagnosis not present

## 2011-12-27 DIAGNOSIS — C50419 Malignant neoplasm of upper-outer quadrant of unspecified female breast: Secondary | ICD-10-CM | POA: Diagnosis not present

## 2011-12-27 DIAGNOSIS — D0590 Unspecified type of carcinoma in situ of unspecified breast: Secondary | ICD-10-CM | POA: Diagnosis not present

## 2011-12-27 DIAGNOSIS — L589 Radiodermatitis, unspecified: Secondary | ICD-10-CM | POA: Diagnosis not present

## 2011-12-27 NOTE — Progress Notes (Signed)
[facility] Radiation Oncology Weekly Treatment Note    Name: [name] Date: 12/27/2011 MRN: [id number] DOB: August 29, 1936  Status: outpatient    Current dose: 2880  Current fraction:16  Planned dose:6240  Planned fraction:34   MEDICATIONS: Current Outpatient Prescriptions  Medication Sig Dispense Refill  . amitriptyline (ELAVIL) 50 MG tablet Take 50 mg by mouth at bedtime.       Marland Kitchen amLODipine (NORVASC) 10 MG tablet Take 10 mg by mouth daily.       Marland Kitchen BONIVA 150 MG tablet Take 150 mg by mouth every 30 (thirty) days.       . calcium citrate-vitamin D (CITRACAL+D) 315-200 MG-UNIT per tablet Take 1 tablet by mouth 2 (two) times daily.       . cholecalciferol (VITAMIN D) 1000 UNITS tablet Take 1,000 Units by mouth daily. Vitamin D3      . ENABLEX 15 MG 24 hr tablet Take 15 mg by mouth daily.       Marland Kitchen HYDROcodone-acetaminophen (VICODIN) 5-500 MG per tablet Take 1 tablet by mouth every 8 (eight) hours as needed. For pain      . metoprolol succinate (TOPROL-XL) 25 MG 24 hr tablet Take 25 mg by mouth daily.        . Multiple Vitamin (MULTIVITAMIN) tablet Take 1 tablet by mouth daily.       . pravastatin (PRAVACHOL) 20 MG tablet Take 20 mg by mouth every evening.       Marland Kitchen PRILOSEC OTC 20 MG tablet Take 20 mg by mouth daily.       Marland Kitchen SYNTHROID 100 MCG tablet Take 100 mcg by mouth daily.       Marland Kitchen warfarin (COUMADIN) 2.5 MG tablet Take 1.25-2.5 mg by mouth daily. Alternates days taking 1 tab one day then 0.5 tab the next         ALLERGIES: Contrast media; Iohexol; and Penicillins   LABORATORY DATA:  Lab Results  Component Value Date   WBC 7.8 11/19/2011   HGB 12.9 11/19/2011   HCT 37.9 11/19/2011   MCV 85.4 11/19/2011   PLT 185 11/19/2011   Lab Results  Component Value Date   NA 137 11/19/2011   K 4.4 11/19/2011   CL 102 11/19/2011   CO2 29 11/19/2011   Lab Results  Component Value Date   ALT 21 11/19/2011   AST 21 11/19/2011   ALKPHOS 62 11/19/2011   BILITOT 0.3 11/19/2011       NARRATIVE: [name] was seen today for weekly treatment management. The chart was checked and port films images were reviewed. Doing well. No signifi skin issues so far.  PHYSICAL EXAMINATION: weight is 121 lb 12.8 oz (55.248 kg).    Minimal erythema   ASSESSMENT: Patient tolerating treatments well.    PLAN: Continue treatment as planned.

## 2011-12-27 NOTE — Progress Notes (Signed)
Pt has no c/o today, voice is hoarse, but denies sore throat.

## 2011-12-28 ENCOUNTER — Ambulatory Visit
Admission: RE | Admit: 2011-12-28 | Discharge: 2011-12-28 | Disposition: A | Discharge status: Home / Self Care | Payer: Medicare Other | Source: Ambulatory Visit | Attending: Radiation Oncology | Admitting: Radiation Oncology

## 2011-12-28 DIAGNOSIS — L589 Radiodermatitis, unspecified: Secondary | ICD-10-CM | POA: Diagnosis not present

## 2011-12-28 DIAGNOSIS — Z510 Encounter for antineoplastic radiation therapy: Secondary | ICD-10-CM | POA: Diagnosis not present

## 2011-12-28 DIAGNOSIS — M810 Age-related osteoporosis without current pathological fracture: Secondary | ICD-10-CM | POA: Diagnosis not present

## 2011-12-28 DIAGNOSIS — E7800 Pure hypercholesterolemia, unspecified: Secondary | ICD-10-CM | POA: Diagnosis not present

## 2011-12-28 DIAGNOSIS — D0590 Unspecified type of carcinoma in situ of unspecified breast: Secondary | ICD-10-CM | POA: Diagnosis not present

## 2011-12-28 DIAGNOSIS — Z170 Estrogen receptor positive status [ER+]: Secondary | ICD-10-CM | POA: Diagnosis not present

## 2011-12-31 ENCOUNTER — Ambulatory Visit
Admission: RE | Admit: 2011-12-31 | Discharge: 2011-12-31 | Disposition: A | Discharge status: Home / Self Care | Payer: Medicare Other | Source: Ambulatory Visit | Attending: Radiation Oncology | Admitting: Radiation Oncology

## 2011-12-31 DIAGNOSIS — Z170 Estrogen receptor positive status [ER+]: Secondary | ICD-10-CM | POA: Diagnosis not present

## 2011-12-31 DIAGNOSIS — M810 Age-related osteoporosis without current pathological fracture: Secondary | ICD-10-CM | POA: Diagnosis not present

## 2011-12-31 DIAGNOSIS — E7800 Pure hypercholesterolemia, unspecified: Secondary | ICD-10-CM | POA: Diagnosis not present

## 2011-12-31 DIAGNOSIS — Z510 Encounter for antineoplastic radiation therapy: Secondary | ICD-10-CM | POA: Diagnosis not present

## 2011-12-31 DIAGNOSIS — L589 Radiodermatitis, unspecified: Secondary | ICD-10-CM | POA: Diagnosis not present

## 2011-12-31 DIAGNOSIS — D0590 Unspecified type of carcinoma in situ of unspecified breast: Secondary | ICD-10-CM | POA: Diagnosis not present

## 2012-01-01 ENCOUNTER — Ambulatory Visit
Admission: RE | Admit: 2012-01-01 | Discharge: 2012-01-01 | Disposition: A | Discharge status: Home / Self Care | Payer: Medicare Other | Source: Ambulatory Visit | Attending: Radiation Oncology | Admitting: Radiation Oncology

## 2012-01-01 DIAGNOSIS — Z510 Encounter for antineoplastic radiation therapy: Secondary | ICD-10-CM | POA: Diagnosis not present

## 2012-01-01 DIAGNOSIS — E7800 Pure hypercholesterolemia, unspecified: Secondary | ICD-10-CM | POA: Diagnosis not present

## 2012-01-01 DIAGNOSIS — M810 Age-related osteoporosis without current pathological fracture: Secondary | ICD-10-CM | POA: Diagnosis not present

## 2012-01-01 DIAGNOSIS — L589 Radiodermatitis, unspecified: Secondary | ICD-10-CM | POA: Diagnosis not present

## 2012-01-01 DIAGNOSIS — Z170 Estrogen receptor positive status [ER+]: Secondary | ICD-10-CM | POA: Diagnosis not present

## 2012-01-01 DIAGNOSIS — D0590 Unspecified type of carcinoma in situ of unspecified breast: Secondary | ICD-10-CM | POA: Diagnosis not present

## 2012-01-02 ENCOUNTER — Ambulatory Visit
Admission: RE | Admit: 2012-01-02 | Discharge: 2012-01-02 | Disposition: A | Discharge status: Home / Self Care | Payer: Medicare Other | Source: Ambulatory Visit | Attending: Radiation Oncology | Admitting: Radiation Oncology

## 2012-01-02 DIAGNOSIS — L589 Radiodermatitis, unspecified: Secondary | ICD-10-CM | POA: Diagnosis not present

## 2012-01-02 DIAGNOSIS — E7800 Pure hypercholesterolemia, unspecified: Secondary | ICD-10-CM | POA: Diagnosis not present

## 2012-01-02 DIAGNOSIS — Z170 Estrogen receptor positive status [ER+]: Secondary | ICD-10-CM | POA: Diagnosis not present

## 2012-01-02 DIAGNOSIS — M810 Age-related osteoporosis without current pathological fracture: Secondary | ICD-10-CM | POA: Diagnosis not present

## 2012-01-02 DIAGNOSIS — D0590 Unspecified type of carcinoma in situ of unspecified breast: Secondary | ICD-10-CM | POA: Diagnosis not present

## 2012-01-02 DIAGNOSIS — Z510 Encounter for antineoplastic radiation therapy: Secondary | ICD-10-CM | POA: Diagnosis not present

## 2012-01-03 ENCOUNTER — Ambulatory Visit
Admission: RE | Admit: 2012-01-03 | Discharge: 2012-01-03 | Disposition: A | Discharge status: Home / Self Care | Payer: Medicare Other | Source: Ambulatory Visit | Attending: Radiation Oncology | Admitting: Radiation Oncology

## 2012-01-03 ENCOUNTER — Encounter: Payer: Self-pay | Admitting: Radiation Oncology

## 2012-01-03 VITALS — Wt 124.5 lb

## 2012-01-03 DIAGNOSIS — M810 Age-related osteoporosis without current pathological fracture: Secondary | ICD-10-CM | POA: Diagnosis not present

## 2012-01-03 DIAGNOSIS — Z510 Encounter for antineoplastic radiation therapy: Secondary | ICD-10-CM | POA: Diagnosis not present

## 2012-01-03 DIAGNOSIS — C50419 Malignant neoplasm of upper-outer quadrant of unspecified female breast: Secondary | ICD-10-CM | POA: Diagnosis not present

## 2012-01-03 DIAGNOSIS — E7800 Pure hypercholesterolemia, unspecified: Secondary | ICD-10-CM | POA: Diagnosis not present

## 2012-01-03 DIAGNOSIS — Z170 Estrogen receptor positive status [ER+]: Secondary | ICD-10-CM | POA: Diagnosis not present

## 2012-01-03 DIAGNOSIS — D0590 Unspecified type of carcinoma in situ of unspecified breast: Secondary | ICD-10-CM | POA: Diagnosis not present

## 2012-01-03 DIAGNOSIS — L589 Radiodermatitis, unspecified: Secondary | ICD-10-CM | POA: Diagnosis not present

## 2012-01-03 NOTE — Progress Notes (Signed)
Pt reports fatigue. No other c/o. Applying radiaplex bid.

## 2012-01-04 ENCOUNTER — Ambulatory Visit
Admission: RE | Admit: 2012-01-04 | Discharge: 2012-01-04 | Disposition: A | Discharge status: Home / Self Care | Payer: Medicare Other | Source: Ambulatory Visit | Attending: Radiation Oncology | Admitting: Radiation Oncology

## 2012-01-04 DIAGNOSIS — M810 Age-related osteoporosis without current pathological fracture: Secondary | ICD-10-CM | POA: Diagnosis not present

## 2012-01-04 DIAGNOSIS — Z170 Estrogen receptor positive status [ER+]: Secondary | ICD-10-CM | POA: Diagnosis not present

## 2012-01-04 DIAGNOSIS — E7800 Pure hypercholesterolemia, unspecified: Secondary | ICD-10-CM | POA: Diagnosis not present

## 2012-01-04 DIAGNOSIS — Z510 Encounter for antineoplastic radiation therapy: Secondary | ICD-10-CM | POA: Diagnosis not present

## 2012-01-04 DIAGNOSIS — L589 Radiodermatitis, unspecified: Secondary | ICD-10-CM | POA: Diagnosis not present

## 2012-01-04 DIAGNOSIS — D0590 Unspecified type of carcinoma in situ of unspecified breast: Secondary | ICD-10-CM | POA: Diagnosis not present

## 2012-01-05 NOTE — Progress Notes (Signed)
[facility] Radiation Oncology Weekly Treatment Note    Name: [name] Date: 01/05/2012 MRN: [id number] DOB: [birth year]  Status: outpatient    Current dose: 3780  Current fraction:21  Planned dose:6240  Planned fraction:34   MEDICATIONS: Current Outpatient Prescriptions  Medication Sig Dispense Refill  . amitriptyline (ELAVIL) 50 MG tablet Take 50 mg by mouth at bedtime.       Marland Kitchen amLODipine (NORVASC) 10 MG tablet Take 10 mg by mouth daily.       Marland Kitchen BONIVA 150 MG tablet Take 150 mg by mouth every 30 (thirty) days.       . calcium citrate-vitamin D (CITRACAL+D) 315-200 MG-UNIT per tablet Take 1 tablet by mouth 2 (two) times daily.       . cholecalciferol (VITAMIN D) 1000 UNITS tablet Take 1,000 Units by mouth daily. Vitamin D3      . ENABLEX 15 MG 24 hr tablet Take 15 mg by mouth daily.       Marland Kitchen HYDROcodone-acetaminophen (VICODIN) 5-500 MG per tablet Take 1 tablet by mouth every 8 (eight) hours as needed. For pain      . metoprolol succinate (TOPROL-XL) 25 MG 24 hr tablet Take 25 mg by mouth daily.        . Multiple Vitamin (MULTIVITAMIN) tablet Take 1 tablet by mouth daily.       . pravastatin (PRAVACHOL) 20 MG tablet Take 20 mg by mouth every evening.       Marland Kitchen PRILOSEC OTC 20 MG tablet Take 20 mg by mouth daily.       Marland Kitchen SYNTHROID 100 MCG tablet Take 100 mcg by mouth daily.       Marland Kitchen warfarin (COUMADIN) 2.5 MG tablet Take 1.25-2.5 mg by mouth daily. Alternates days taking 1 tab one day then 0.5 tab the next         ALLERGIES: Contrast media; Iohexol; and Penicillins   LABORATORY DATA:  Lab Results  Component Value Date   WBC 7.8 11/19/2011   HGB 12.9 11/19/2011   HCT 37.9 11/19/2011   MCV 85.4 11/19/2011   PLT 185 11/19/2011   Lab Results  Component Value Date   NA 137 11/19/2011   K 4.4 11/19/2011   CL 102 11/19/2011   CO2 29 11/19/2011   Lab Results  Component Value Date   ALT 21 11/19/2011   AST 21 11/19/2011   ALKPHOS 62 11/19/2011   BILITOT 0.3 11/19/2011       NARRATIVE: [name] was seen today for weekly treatment management. The chart was checked and port films images were reviewed. Pt doing well. Fatigue. O/w no complaints.  PHYSICAL EXAMINATION: weight is 124 lb 8 oz (56.473 kg).       ASSESSMENT: Patient tolerating treatments well.    PLAN: Continue treatment as planned.

## 2012-01-07 ENCOUNTER — Ambulatory Visit
Admission: RE | Admit: 2012-01-07 | Discharge: 2012-01-07 | Disposition: A | Discharge status: Home / Self Care | Payer: Medicare Other | Source: Ambulatory Visit | Attending: Radiation Oncology | Admitting: Radiation Oncology

## 2012-01-07 DIAGNOSIS — M810 Age-related osteoporosis without current pathological fracture: Secondary | ICD-10-CM | POA: Diagnosis not present

## 2012-01-07 DIAGNOSIS — D0590 Unspecified type of carcinoma in situ of unspecified breast: Secondary | ICD-10-CM | POA: Diagnosis not present

## 2012-01-07 DIAGNOSIS — Z170 Estrogen receptor positive status [ER+]: Secondary | ICD-10-CM | POA: Diagnosis not present

## 2012-01-07 DIAGNOSIS — E7800 Pure hypercholesterolemia, unspecified: Secondary | ICD-10-CM | POA: Diagnosis not present

## 2012-01-07 DIAGNOSIS — Z510 Encounter for antineoplastic radiation therapy: Secondary | ICD-10-CM | POA: Diagnosis not present

## 2012-01-07 DIAGNOSIS — L589 Radiodermatitis, unspecified: Secondary | ICD-10-CM | POA: Diagnosis not present

## 2012-01-08 ENCOUNTER — Ambulatory Visit
Admission: RE | Admit: 2012-01-08 | Discharge: 2012-01-08 | Disposition: A | Discharge status: Home / Self Care | Payer: Medicare Other | Source: Ambulatory Visit | Attending: Radiation Oncology | Admitting: Radiation Oncology

## 2012-01-08 DIAGNOSIS — M810 Age-related osteoporosis without current pathological fracture: Secondary | ICD-10-CM | POA: Diagnosis not present

## 2012-01-08 DIAGNOSIS — Z510 Encounter for antineoplastic radiation therapy: Secondary | ICD-10-CM | POA: Diagnosis not present

## 2012-01-08 DIAGNOSIS — D0590 Unspecified type of carcinoma in situ of unspecified breast: Secondary | ICD-10-CM | POA: Diagnosis not present

## 2012-01-08 DIAGNOSIS — E7800 Pure hypercholesterolemia, unspecified: Secondary | ICD-10-CM | POA: Diagnosis not present

## 2012-01-08 DIAGNOSIS — Z170 Estrogen receptor positive status [ER+]: Secondary | ICD-10-CM | POA: Diagnosis not present

## 2012-01-08 DIAGNOSIS — L589 Radiodermatitis, unspecified: Secondary | ICD-10-CM | POA: Diagnosis not present

## 2012-01-09 ENCOUNTER — Ambulatory Visit
Admission: RE | Admit: 2012-01-09 | Discharge: 2012-01-09 | Disposition: A | Discharge status: Home / Self Care | Payer: Medicare Other | Source: Ambulatory Visit | Attending: Radiation Oncology | Admitting: Radiation Oncology

## 2012-01-09 DIAGNOSIS — M810 Age-related osteoporosis without current pathological fracture: Secondary | ICD-10-CM | POA: Diagnosis not present

## 2012-01-09 DIAGNOSIS — E7800 Pure hypercholesterolemia, unspecified: Secondary | ICD-10-CM | POA: Diagnosis not present

## 2012-01-09 DIAGNOSIS — Z170 Estrogen receptor positive status [ER+]: Secondary | ICD-10-CM | POA: Diagnosis not present

## 2012-01-09 DIAGNOSIS — D0590 Unspecified type of carcinoma in situ of unspecified breast: Secondary | ICD-10-CM | POA: Diagnosis not present

## 2012-01-09 DIAGNOSIS — L589 Radiodermatitis, unspecified: Secondary | ICD-10-CM | POA: Diagnosis not present

## 2012-01-09 DIAGNOSIS — Z510 Encounter for antineoplastic radiation therapy: Secondary | ICD-10-CM | POA: Diagnosis not present

## 2012-01-10 ENCOUNTER — Ambulatory Visit
Admission: RE | Admit: 2012-01-10 | Discharge: 2012-01-10 | Disposition: A | Discharge status: Home / Self Care | Payer: Medicare Other | Source: Ambulatory Visit | Attending: Radiation Oncology | Admitting: Radiation Oncology

## 2012-01-10 ENCOUNTER — Encounter: Payer: Self-pay | Admitting: Radiation Oncology

## 2012-01-10 VITALS — Wt 125.0 lb

## 2012-01-10 DIAGNOSIS — Z7901 Long term (current) use of anticoagulants: Secondary | ICD-10-CM | POA: Diagnosis not present

## 2012-01-10 DIAGNOSIS — Z170 Estrogen receptor positive status [ER+]: Secondary | ICD-10-CM | POA: Diagnosis not present

## 2012-01-10 DIAGNOSIS — C50419 Malignant neoplasm of upper-outer quadrant of unspecified female breast: Secondary | ICD-10-CM

## 2012-01-10 DIAGNOSIS — Z510 Encounter for antineoplastic radiation therapy: Secondary | ICD-10-CM | POA: Diagnosis not present

## 2012-01-10 DIAGNOSIS — L589 Radiodermatitis, unspecified: Secondary | ICD-10-CM | POA: Diagnosis not present

## 2012-01-10 DIAGNOSIS — M810 Age-related osteoporosis without current pathological fracture: Secondary | ICD-10-CM | POA: Diagnosis not present

## 2012-01-10 DIAGNOSIS — C50919 Malignant neoplasm of unspecified site of unspecified female breast: Secondary | ICD-10-CM

## 2012-01-10 DIAGNOSIS — E7800 Pure hypercholesterolemia, unspecified: Secondary | ICD-10-CM | POA: Diagnosis not present

## 2012-01-10 DIAGNOSIS — D0590 Unspecified type of carcinoma in situ of unspecified breast: Secondary | ICD-10-CM | POA: Diagnosis not present

## 2012-01-10 MED ORDER — RADIAPLEXRX EX GEL
Freq: Once | CUTANEOUS | Status: AC
  Administered 2012-01-10: 12:00:00 via TOPICAL

## 2012-01-10 NOTE — Progress Notes (Signed)
Pt alert and oriented x 3, right chest area  Erythema, itches some stated pt, pt request another radiaplex gel, just irritation at site:" no real pain skin intact, 26/34 r breast txs 10:52 AM

## 2012-01-10 NOTE — Progress Notes (Signed)
[facility] Radiation Oncology Weekly Treatment Note    Name: [name] Date: 01/10/2012 MRN: [id number] DOB: 06-20-1936  Status: outpatient    Current dose: 4680  Current fraction:26  Planned dose:6240  Planned fraction:34   MEDICATIONS: Current Outpatient Prescriptions  Medication Sig Dispense Refill  . amitriptyline (ELAVIL) 50 MG tablet Take 50 mg by mouth at bedtime.       Marland Kitchen amLODipine (NORVASC) 10 MG tablet Take 10 mg by mouth daily.       Marland Kitchen BONIVA 150 MG tablet Take 150 mg by mouth every 30 (thirty) days.       . calcium citrate-vitamin D (CITRACAL+D) 315-200 MG-UNIT per tablet Take 1 tablet by mouth 2 (two) times daily.       . cholecalciferol (VITAMIN D) 1000 UNITS tablet Take 1,000 Units by mouth daily. Vitamin D3      . ENABLEX 15 MG 24 hr tablet Take 15 mg by mouth daily.       Marland Kitchen HYDROcodone-acetaminophen (VICODIN) 5-500 MG per tablet Take 1 tablet by mouth every 8 (eight) hours as needed. For pain      . metoprolol succinate (TOPROL-XL) 25 MG 24 hr tablet Take 25 mg by mouth daily.        . Multiple Vitamin (MULTIVITAMIN) tablet Take 1 tablet by mouth daily.       . pravastatin (PRAVACHOL) 20 MG tablet Take 20 mg by mouth every evening.       Marland Kitchen PRILOSEC OTC 20 MG tablet Take 20 mg by mouth daily.       Marland Kitchen SYNTHROID 100 MCG tablet Take 100 mcg by mouth daily.       Marland Kitchen warfarin (COUMADIN) 2.5 MG tablet Take 1.25-2.5 mg by mouth daily. Alternates days taking 1 tab one day then 0.5 tab the next       Current Facility-Administered Medications  Medication Dose Route Frequency Provider Last Rate Last Dose  . hyaluronate sodium (RADIAPLEXRX) gel   Topical Once Jonna Coup, MD         ALLERGIES: Contrast media; Iohexol; and Penicillins   LABORATORY DATA:  Lab Results  Component Value Date   WBC 7.8 11/19/2011   HGB 12.9 11/19/2011   HCT 37.9 11/19/2011   MCV 85.4 11/19/2011   PLT 185 11/19/2011   Lab Results  Component Value Date   NA 137  11/19/2011   K 4.4 11/19/2011   CL 102 11/19/2011   CO2 29 11/19/2011   Lab Results  Component Value Date   ALT 21 11/19/2011   AST 21 11/19/2011   ALKPHOS 62 11/19/2011   BILITOT 0.3 11/19/2011      NARRATIVE: [name] was seen today for weekly treatment management. The chart was checked and port films images were reviewed. The patient is doing well. She has not had any major changes over the last week. Minimal skin change.  PHYSICAL EXAMINATION: weight is 125 lb (56.7 kg).     the skin looks very good at this point. Some mild diffuse erythema present in the treatment area. Some radiation change is present medially in the central chest area.   ASSESSMENT: Patient tolerating treatments well.    PLAN: Continue treatment as planned.

## 2012-01-11 ENCOUNTER — Ambulatory Visit: Payer: Medicare Other | Admitting: Radiation Oncology

## 2012-01-11 ENCOUNTER — Ambulatory Visit
Admission: RE | Admit: 2012-01-11 | Discharge: 2012-01-11 | Disposition: A | Discharge status: Home / Self Care | Payer: Medicare Other | Source: Ambulatory Visit | Attending: Radiation Oncology | Admitting: Radiation Oncology

## 2012-01-11 DIAGNOSIS — M810 Age-related osteoporosis without current pathological fracture: Secondary | ICD-10-CM | POA: Diagnosis not present

## 2012-01-11 DIAGNOSIS — L589 Radiodermatitis, unspecified: Secondary | ICD-10-CM | POA: Diagnosis not present

## 2012-01-11 DIAGNOSIS — Z923 Personal history of irradiation: Secondary | ICD-10-CM

## 2012-01-11 DIAGNOSIS — E7800 Pure hypercholesterolemia, unspecified: Secondary | ICD-10-CM | POA: Diagnosis not present

## 2012-01-11 DIAGNOSIS — D0590 Unspecified type of carcinoma in situ of unspecified breast: Secondary | ICD-10-CM | POA: Diagnosis not present

## 2012-01-11 DIAGNOSIS — Z510 Encounter for antineoplastic radiation therapy: Secondary | ICD-10-CM | POA: Diagnosis not present

## 2012-01-11 DIAGNOSIS — Z170 Estrogen receptor positive status [ER+]: Secondary | ICD-10-CM | POA: Diagnosis not present

## 2012-01-11 HISTORY — DX: Personal history of irradiation: Z92.3

## 2012-01-14 ENCOUNTER — Ambulatory Visit
Admission: RE | Admit: 2012-01-14 | Discharge: 2012-01-14 | Disposition: A | Discharge status: Home / Self Care | Payer: Medicare Other | Source: Ambulatory Visit | Attending: Radiation Oncology | Admitting: Radiation Oncology

## 2012-01-14 DIAGNOSIS — C50419 Malignant neoplasm of upper-outer quadrant of unspecified female breast: Secondary | ICD-10-CM

## 2012-01-14 DIAGNOSIS — D0590 Unspecified type of carcinoma in situ of unspecified breast: Secondary | ICD-10-CM | POA: Diagnosis not present

## 2012-01-14 DIAGNOSIS — Z170 Estrogen receptor positive status [ER+]: Secondary | ICD-10-CM | POA: Diagnosis not present

## 2012-01-14 DIAGNOSIS — Z510 Encounter for antineoplastic radiation therapy: Secondary | ICD-10-CM | POA: Diagnosis not present

## 2012-01-14 DIAGNOSIS — M810 Age-related osteoporosis without current pathological fracture: Secondary | ICD-10-CM | POA: Diagnosis not present

## 2012-01-14 DIAGNOSIS — E7800 Pure hypercholesterolemia, unspecified: Secondary | ICD-10-CM | POA: Diagnosis not present

## 2012-01-14 DIAGNOSIS — L589 Radiodermatitis, unspecified: Secondary | ICD-10-CM | POA: Diagnosis not present

## 2012-01-14 NOTE — Progress Notes (Signed)
[facility] Radiation Oncology Simulation Verification Note   Name: [name] MRN: [id number]  Date: 01/14/2012  DOB: 1936-01-31  Status: outpatient    DIAGNOSIS: The encounter diagnosis was Malignant neoplasm of upper-outer quadrant of female breast.    POSITION: Patient is positioned prone and  Isocenter was reviewed.. Treatment was approved. The MLC's were also reviewed and these were appropriate.

## 2012-01-15 ENCOUNTER — Ambulatory Visit
Admission: RE | Admit: 2012-01-15 | Discharge: 2012-01-15 | Disposition: A | Discharge status: Home / Self Care | Payer: Medicare Other | Source: Ambulatory Visit | Attending: Radiation Oncology | Admitting: Radiation Oncology

## 2012-01-15 DIAGNOSIS — M810 Age-related osteoporosis without current pathological fracture: Secondary | ICD-10-CM | POA: Diagnosis not present

## 2012-01-15 DIAGNOSIS — D0590 Unspecified type of carcinoma in situ of unspecified breast: Secondary | ICD-10-CM | POA: Diagnosis not present

## 2012-01-15 DIAGNOSIS — E7800 Pure hypercholesterolemia, unspecified: Secondary | ICD-10-CM | POA: Diagnosis not present

## 2012-01-15 DIAGNOSIS — Z510 Encounter for antineoplastic radiation therapy: Secondary | ICD-10-CM | POA: Diagnosis not present

## 2012-01-15 DIAGNOSIS — Z170 Estrogen receptor positive status [ER+]: Secondary | ICD-10-CM | POA: Diagnosis not present

## 2012-01-15 DIAGNOSIS — L589 Radiodermatitis, unspecified: Secondary | ICD-10-CM | POA: Diagnosis not present

## 2012-01-16 ENCOUNTER — Ambulatory Visit
Admission: RE | Admit: 2012-01-16 | Discharge: 2012-01-16 | Disposition: A | Discharge status: Home / Self Care | Payer: Medicare Other | Source: Ambulatory Visit | Attending: Radiation Oncology | Admitting: Radiation Oncology

## 2012-01-16 DIAGNOSIS — E7800 Pure hypercholesterolemia, unspecified: Secondary | ICD-10-CM | POA: Diagnosis not present

## 2012-01-16 DIAGNOSIS — L589 Radiodermatitis, unspecified: Secondary | ICD-10-CM | POA: Diagnosis not present

## 2012-01-16 DIAGNOSIS — D0590 Unspecified type of carcinoma in situ of unspecified breast: Secondary | ICD-10-CM | POA: Diagnosis not present

## 2012-01-16 DIAGNOSIS — Z170 Estrogen receptor positive status [ER+]: Secondary | ICD-10-CM | POA: Diagnosis not present

## 2012-01-16 DIAGNOSIS — M810 Age-related osteoporosis without current pathological fracture: Secondary | ICD-10-CM | POA: Diagnosis not present

## 2012-01-16 DIAGNOSIS — Z510 Encounter for antineoplastic radiation therapy: Secondary | ICD-10-CM | POA: Diagnosis not present

## 2012-01-17 ENCOUNTER — Ambulatory Visit
Admission: RE | Admit: 2012-01-17 | Discharge: 2012-01-17 | Disposition: A | Discharge status: Home / Self Care | Payer: Medicare Other | Source: Ambulatory Visit | Attending: Radiation Oncology | Admitting: Radiation Oncology

## 2012-01-17 ENCOUNTER — Encounter: Payer: Self-pay | Admitting: Radiation Oncology

## 2012-01-17 VITALS — Wt 125.3 lb

## 2012-01-17 DIAGNOSIS — L589 Radiodermatitis, unspecified: Secondary | ICD-10-CM | POA: Diagnosis not present

## 2012-01-17 DIAGNOSIS — C50419 Malignant neoplasm of upper-outer quadrant of unspecified female breast: Secondary | ICD-10-CM | POA: Diagnosis not present

## 2012-01-17 DIAGNOSIS — E7800 Pure hypercholesterolemia, unspecified: Secondary | ICD-10-CM | POA: Diagnosis not present

## 2012-01-17 DIAGNOSIS — D0590 Unspecified type of carcinoma in situ of unspecified breast: Secondary | ICD-10-CM | POA: Diagnosis not present

## 2012-01-17 DIAGNOSIS — M810 Age-related osteoporosis without current pathological fracture: Secondary | ICD-10-CM | POA: Diagnosis not present

## 2012-01-17 DIAGNOSIS — Z170 Estrogen receptor positive status [ER+]: Secondary | ICD-10-CM | POA: Diagnosis not present

## 2012-01-17 DIAGNOSIS — Z510 Encounter for antineoplastic radiation therapy: Secondary | ICD-10-CM | POA: Diagnosis not present

## 2012-01-17 NOTE — Progress Notes (Signed)
[facility] Radiation Oncology Weekly Treatment Note    Name: [name] Date: 01/17/2012 MRN: [id number] DOB: 1936-10-29  Status: outpatient    Current dose: 56.4 gray  Current fraction: 31  Planned dose: 62.4 gray  Planned fraction: 34   MEDICATIONS: Current Outpatient Prescriptions  Medication Sig Dispense Refill  . amitriptyline (ELAVIL) 50 MG tablet Take 50 mg by mouth at bedtime.       Marland Kitchen amLODipine (NORVASC) 10 MG tablet Take 10 mg by mouth daily.       Marland Kitchen BONIVA 150 MG tablet Take 150 mg by mouth every 30 (thirty) days.       . calcium citrate-vitamin D (CITRACAL+D) 315-200 MG-UNIT per tablet Take 1 tablet by mouth 2 (two) times daily.       . cholecalciferol (VITAMIN D) 1000 UNITS tablet Take 1,000 Units by mouth daily. Vitamin D3      . ENABLEX 15 MG 24 hr tablet Take 15 mg by mouth daily.       Marland Kitchen HYDROcodone-acetaminophen (VICODIN) 5-500 MG per tablet Take 1 tablet by mouth every 8 (eight) hours as needed. For pain      . metoprolol succinate (TOPROL-XL) 25 MG 24 hr tablet Take 25 mg by mouth daily.        . Multiple Vitamin (MULTIVITAMIN) tablet Take 1 tablet by mouth daily.       . pravastatin (PRAVACHOL) 20 MG tablet Take 20 mg by mouth every evening.       Marland Kitchen PRILOSEC OTC 20 MG tablet Take 20 mg by mouth daily.       Marland Kitchen SYNTHROID 100 MCG tablet Take 100 mcg by mouth daily.       Marland Kitchen warfarin (COUMADIN) 2.5 MG tablet Take 1.25-2.5 mg by mouth daily. Alternates days taking 1 tab one day then 0.5 tab the next         ALLERGIES: Contrast media; Iohexol; and Penicillins   LABORATORY DATA:  Lab Results  Component Value Date   WBC 7.8 11/19/2011   HGB 12.9 11/19/2011   HCT 37.9 11/19/2011   MCV 85.4 11/19/2011   PLT 185 11/19/2011   Lab Results  Component Value Date   NA 137 11/19/2011   K 4.4 11/19/2011   CL 102 11/19/2011   CO2 29 11/19/2011   Lab Results  Component Value Date   ALT 21 11/19/2011   AST 21 11/19/2011   ALKPHOS 62 11/19/2011   BILITOT 0.3  11/19/2011      NARRATIVE: [name] was seen today for weekly treatment management. The chart was checked and port films images were reviewed. The patient is doing well. She does have some redness in the chest medially. She has been using hydrocortisone for itching and irritation which is helping.  PHYSICAL EXAMINATION: weight is 125 lb 4.8 oz (56.836 kg).    overall the patient's skin looks good. There is some erythema and dermatitis as described. No significant desquamation  ASSESSMENT: Patient tolerating treatments well.    PLAN: Continue treatment as planned.

## 2012-01-17 NOTE — Progress Notes (Signed)
Pt applying Cortisone cream to radiation dermatitis area w/good relief of itching. Radiaplex in other tx areas. Denies pain.

## 2012-01-18 ENCOUNTER — Ambulatory Visit
Admission: RE | Admit: 2012-01-18 | Discharge: 2012-01-18 | Disposition: A | Discharge status: Home / Self Care | Payer: Medicare Other | Source: Ambulatory Visit | Attending: Radiation Oncology | Admitting: Radiation Oncology

## 2012-01-18 DIAGNOSIS — M810 Age-related osteoporosis without current pathological fracture: Secondary | ICD-10-CM | POA: Diagnosis not present

## 2012-01-18 DIAGNOSIS — Z170 Estrogen receptor positive status [ER+]: Secondary | ICD-10-CM | POA: Diagnosis not present

## 2012-01-18 DIAGNOSIS — E7800 Pure hypercholesterolemia, unspecified: Secondary | ICD-10-CM | POA: Diagnosis not present

## 2012-01-18 DIAGNOSIS — Z510 Encounter for antineoplastic radiation therapy: Secondary | ICD-10-CM | POA: Diagnosis not present

## 2012-01-18 DIAGNOSIS — L589 Radiodermatitis, unspecified: Secondary | ICD-10-CM | POA: Diagnosis not present

## 2012-01-18 DIAGNOSIS — D0590 Unspecified type of carcinoma in situ of unspecified breast: Secondary | ICD-10-CM | POA: Diagnosis not present

## 2012-01-21 ENCOUNTER — Ambulatory Visit
Admission: RE | Admit: 2012-01-21 | Discharge: 2012-01-21 | Disposition: A | Discharge status: Home / Self Care | Payer: Medicare Other | Source: Ambulatory Visit | Attending: Radiation Oncology | Admitting: Radiation Oncology

## 2012-01-21 ENCOUNTER — Other Ambulatory Visit: Payer: Self-pay | Admitting: *Deleted

## 2012-01-21 DIAGNOSIS — Z510 Encounter for antineoplastic radiation therapy: Secondary | ICD-10-CM | POA: Diagnosis not present

## 2012-01-21 DIAGNOSIS — L589 Radiodermatitis, unspecified: Secondary | ICD-10-CM | POA: Diagnosis not present

## 2012-01-21 DIAGNOSIS — M810 Age-related osteoporosis without current pathological fracture: Secondary | ICD-10-CM | POA: Diagnosis not present

## 2012-01-21 DIAGNOSIS — E7800 Pure hypercholesterolemia, unspecified: Secondary | ICD-10-CM | POA: Diagnosis not present

## 2012-01-21 DIAGNOSIS — I10 Essential (primary) hypertension: Secondary | ICD-10-CM

## 2012-01-21 DIAGNOSIS — Z170 Estrogen receptor positive status [ER+]: Secondary | ICD-10-CM | POA: Diagnosis not present

## 2012-01-21 DIAGNOSIS — D0590 Unspecified type of carcinoma in situ of unspecified breast: Secondary | ICD-10-CM | POA: Diagnosis not present

## 2012-01-21 MED ORDER — METOPROLOL SUCCINATE ER 25 MG PO TB24: 25.0000 mg | ORAL_TABLET | Freq: Every day | ORAL | Status: DC

## 2012-01-22 ENCOUNTER — Ambulatory Visit
Admission: RE | Admit: 2012-01-22 | Discharge: 2012-01-22 | Disposition: A | Discharge status: Home / Self Care | Payer: Medicare Other | Source: Ambulatory Visit | Attending: Radiation Oncology | Admitting: Radiation Oncology

## 2012-01-22 DIAGNOSIS — L589 Radiodermatitis, unspecified: Secondary | ICD-10-CM | POA: Diagnosis not present

## 2012-01-22 DIAGNOSIS — Z170 Estrogen receptor positive status [ER+]: Secondary | ICD-10-CM | POA: Diagnosis not present

## 2012-01-22 DIAGNOSIS — M810 Age-related osteoporosis without current pathological fracture: Secondary | ICD-10-CM | POA: Diagnosis not present

## 2012-01-22 DIAGNOSIS — Z510 Encounter for antineoplastic radiation therapy: Secondary | ICD-10-CM | POA: Diagnosis not present

## 2012-01-22 DIAGNOSIS — D0590 Unspecified type of carcinoma in situ of unspecified breast: Secondary | ICD-10-CM | POA: Diagnosis not present

## 2012-01-22 DIAGNOSIS — E7800 Pure hypercholesterolemia, unspecified: Secondary | ICD-10-CM | POA: Diagnosis not present

## 2012-01-23 ENCOUNTER — Encounter (INDEPENDENT_AMBULATORY_CARE_PROVIDER_SITE_OTHER): Payer: Self-pay | Admitting: Surgery

## 2012-01-23 ENCOUNTER — Ambulatory Visit (INDEPENDENT_AMBULATORY_CARE_PROVIDER_SITE_OTHER): Payer: Medicare Other | Admitting: Surgery

## 2012-01-23 VITALS — BP 104/68 | HR 66 | Temp 96.9°F | Resp 16 | Ht 60.0 in | Wt 125.6 lb

## 2012-01-23 DIAGNOSIS — C50919 Malignant neoplasm of unspecified site of unspecified female breast: Secondary | ICD-10-CM | POA: Diagnosis not present

## 2012-01-23 NOTE — Patient Instructions (Signed)
Call me next week to discuss follow up

## 2012-01-23 NOTE — Progress Notes (Signed)
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 She has no problems or concerns on either side.  PFSH: She has had no significant changes since the last visit here.  ROS: There have been no significant changes since the last visit here  EXAM: General: The patient is alert, oriented, generally healty appearing, NAD. Mood and affect are normal.  Breasts:  Right breast has changes of radiation with redness particularly upper inner quadrant. There is noticeable loss of tissue at the lumpectomy site, not firm  Lymphatics: She has no axillary or supraclavicular adenopathy on either side.  Extremities: Full ROM of the surgical side with no lymphedema noted.  Data Reviewed: NOtes from Dr Mitzi Hansen  Impression: Doing well, with no evidence of recurrent cancer or new cancer  Plan: Will continue to follow up here in six months. She is not certain if she wants to take an anti-estrogen or do six month shots for her bones. Will try to get clarification from Dr Donnie Coffin on his plans,

## 2012-01-30 ENCOUNTER — Telehealth (INDEPENDENT_AMBULATORY_CARE_PROVIDER_SITE_OTHER): Payer: Self-pay | Admitting: General Surgery

## 2012-01-30 NOTE — Telephone Encounter (Signed)
Patient calling to check and see if she does need to follow up with medical oncology. Dr Jamey Ripa spoke with Dr Donnie Coffin and he does feel that she needs to be on an anti-estrogen medication. Patient would like to switch medical oncology MD's. Dr Jamey Ripa said he would speak with them and get her switched. Patient aware she will be contacted with an appt.

## 2012-02-01 ENCOUNTER — Telehealth: Payer: Self-pay | Admitting: Oncology

## 2012-02-01 NOTE — Telephone Encounter (Signed)
I called the patient to discuss her situation with the patient.    She expressed frustration about the fact that she saw Dr. Donnie Coffin that he discussed options with her about what antiestrogen prescriptions.  She wanted to be informed of his recommendation not discussed without a resolution.    I offered to set her up with another MD and she stated she is not sure what she wants to do at this point.   She said Dr. Jamey Ripa told her Dr. Donnie Coffin is a good doctor.  She is going to discuss her options with her husband and call me back.

## 2012-02-04 DIAGNOSIS — Z7901 Long term (current) use of anticoagulants: Secondary | ICD-10-CM | POA: Diagnosis not present

## 2012-02-04 DIAGNOSIS — E785 Hyperlipidemia, unspecified: Secondary | ICD-10-CM | POA: Diagnosis not present

## 2012-02-05 DIAGNOSIS — Z7901 Long term (current) use of anticoagulants: Secondary | ICD-10-CM | POA: Diagnosis not present

## 2012-02-05 DIAGNOSIS — Z0000 Encounter for general adult medical examination without abnormal findings: Secondary | ICD-10-CM | POA: Diagnosis not present

## 2012-02-05 DIAGNOSIS — Z1331 Encounter for screening for depression: Secondary | ICD-10-CM | POA: Diagnosis not present

## 2012-02-05 NOTE — Progress Notes (Signed)
Complex simulation note  The patient has undergone a complex simulation for her upcoming boost treatment. The patient initially has been planned to receive 50.4 gray at 1.8 gray per fraction which corresponds to a course of whole breast radiation to the right breast. The patient now will undergo additional treatment subsequently for an additional 12 gray at 2 gray per fraction. To accomplish this to customized blocks have been designed for this purpose. The patient cannot lie in a supine position. Therefore her treatment will continue with her being in a prone position. A complex isodose plan is requested and ordered to ensure that the tumor cavity is adequately covered. The patient's final total dose therefore will be 62.4 gray.

## 2012-02-05 NOTE — Progress Notes (Signed)
  Radiation Oncology         ([phone]) [phone] ________________________________  Name: [name] MRN: [id number]  Date: 02/05/2012  DOB: [birth year]  End of Treatment Note  Diagnosis:   DCIS of the right breast    Indication for treatment:  Curative       Radiation treatment dates:   12/06/2011 to 01/22/2012  Site/dose:   The patient was treated to the right breast initially with a course of whole breast radiation which was given with the patient in a prone position. This was given to a dose of 50.4 gray. The patient then received a boost to the tumor cavity plus margin to a dose of 12 gray at 2 gray per fraction. The patient's final total dose was 62.4 gray. The patient underwent treatment using a prone position because the patient was unable to lie in a supine position. All of the fields used 6 MV photons.  Narrative: The patient tolerated radiation treatment relatively well.   She had minimal skin irritation and her treatment was completed without substantial to light. There is not any significant desquamation at the end of treatment.  Plan: The patient will return to radiation oncology clinic for routine followup in one month. I advised them to call or return sooner if they have any questions or concerns related to their recovery or treatment. ________________________________  Radene Gunning, M.D., Ph.D.

## 2012-02-05 NOTE — Progress Notes (Signed)
Encounter addended by: Jonna Coup, MD on: 02/05/2012  1:25 PM<BR>     Documentation filed: Visit Diagnoses, Notes Section

## 2012-02-06 ENCOUNTER — Telehealth: Payer: Self-pay | Admitting: *Deleted

## 2012-02-06 NOTE — Telephone Encounter (Signed)
patient called back and confirmed appointment for 02-13-2012 starting at 8:30am

## 2012-02-06 NOTE — Telephone Encounter (Signed)
left message to inform the patient of the new date and time on 02-13-2012 starting at 8:30 for 60 minute appointment

## 2012-02-13 ENCOUNTER — Ambulatory Visit (HOSPITAL_BASED_OUTPATIENT_CLINIC_OR_DEPARTMENT_OTHER): Payer: Medicare Other | Admitting: Oncology

## 2012-02-13 VITALS — BP 110/73 | HR 79 | Temp 97.8°F | Ht 60.0 in | Wt 126.5 lb

## 2012-02-13 DIAGNOSIS — C50919 Malignant neoplasm of unspecified site of unspecified female breast: Secondary | ICD-10-CM | POA: Diagnosis not present

## 2012-02-13 NOTE — Progress Notes (Signed)
ID: [name]   DOB: 1936/08/04  MR#: [id number]  VFI#:[id number]  HISTORY OF PRESENT ILLNESS: The patient had routine annual screening mammography at [facility] 07/30/2011 showing a new area of microcalcifications in the mid upper outer quadrant. She was brought back for additional views 08/20/2011, and biopsy the same day showed high-grade ductal carcinoma in situ. Breast MRI 08/27/2011 showed a 4.1 cm ill-defined area of non-masslike enhancement associated with biopsy clip. There were no other areas of concern, but incidentally a 4.7 cm ascending thoracic aortic aneurysm was noted.  On 10/17/2011 the patient underwent right lumpectomy and sentinel lymph node sampling ([id number]) for a high-grade ductal carcinoma in situ measuring at least 3.8 cm, with a negative but very close posterior margin and  negative sentinel lymph nodes. The tumor was 100% estrogen receptor positive and 48% progesterone receptor positive. Postoperatively the patient received of adjuvant radiation therapy completed 01/22/2012.  REVIEW OF SYSTEMS: The patient tolerated her radiation well, with mild fatigue, no significant desquamation. She has a variety of chronic problems as listed below, but no new issues of concern. A detailed review of systems was entirely stable  PAST MEDICAL HISTORY: Past Medical History  Diagnosis Date  . GERD (gastroesophageal reflux disease)   . Hypercholesteremia   . Osteoporosis   . Thyroid disease   . Rib fractures   . Fracture lumbar vertebra-closed   . Fracture of thoracic vertebra, closed   . Cancer 10/12    bx/right breast  . History of blood clots   . Cataract 3 and 10/92    bilateral  . Hypertension     DR Terrilee Files  . Aneurysm   . Osteoporosis   . 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 DVT (deep venous thrombosis) 08/2011    LL extremity    the patient had a blood clot in August of 2002 in the setting of hormone replacement. That was discontinued. In August  of 2012, she had a second blood clot, which occurred shortly after a vertebral fracture curtailed her usual activity level. She had a left thigh growth removed approximately 10 years ago which initially was thought possibly to be a lymphoma but apparently that was never confirmed and there has been no recurrence of over the past 10 years  PAST SURGICAL HISTORY: Past Surgical History  Procedure Date  . Hernia repair 10/16/2006    [facility] - Dr Daphine Deutscher  . Appendectomy 1940  . Tonsillectomy 1944  . Cataract extraction 1992  . Eye surgery 1940, 1956  . Mastectomy, partial 10/17/2011    Procedure: MASTECTOMY PARTIAL;  Surgeon: Currie Paris, MD;  Location: [facility] OR;  Service: General;  Laterality: Right;  needle guided    FAMILY HISTORY Family History  Problem Relation Age of Onset  . Heart disease Mother   . Heart disease Maternal Uncle   . Heart disease Maternal Grandfather    the patient's father died at the age of 71, the patient is not sure of the cause; the patient's mother died at the age of 76 with congestive heart failure. The patient had no brothers, 2 sisters. There is no history of breast or ovarian cancer in the family  GYNECOLOGIC HISTORY: Menarche age 32, she is GX P0. She'll underwent menopause in her mid 40s. 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 Her husband Alden Server a used to be a Human resources officer and later  supervisor of maintenance for the [employer]. They are the only once at home, with no pets. The patient [facility]ends [facility] [employer] [facility]   ADVANCED DIRECTIVES: not in place  HEALTH MAINTENANCE: History  Substance Use Topics  . Smoking status: Never Smoker   . Smokeless tobacco: Never Used  . Alcohol Use: No     Colonoscopy: 2005 per pt  PAP: n/a  Bone density: 2011/ osteoporosis  Lipid panel: utd  Allergies  Allergen Reactions  . Contrast Media  (Iodinated Diagnostic Agents) Shortness Of Breath  . Iohexol Shortness Of Breath  . Penicillins Rash    Current Outpatient Prescriptions  Medication Sig Dispense Refill  . amitriptyline (ELAVIL) 50 MG tablet Take 50 mg by mouth at bedtime.       Marland Kitchen amLODipine (NORVASC) 10 MG tablet Take 10 mg by mouth daily.       Marland Kitchen BONIVA 150 MG tablet Take 150 mg by mouth every 30 (thirty) days.       . calcium citrate-vitamin D (CITRACAL+D) 315-200 MG-UNIT per tablet Take 1 tablet by mouth 2 (two) times daily.       . cholecalciferol (VITAMIN D) 1000 UNITS tablet Take 1,000 Units by mouth daily. Vitamin D3      . ENABLEX 15 MG 24 hr tablet Take 15 mg by mouth daily.       Marland Kitchen HYDROcodone-acetaminophen (VICODIN) 5-500 MG per tablet Take 1 tablet by mouth every 8 (eight) hours as needed. For pain      . metoprolol succinate (TOPROL-XL) 25 MG 24 hr tablet Take 1 tablet (25 mg total) by mouth daily.  30 tablet  11  . Multiple Vitamin (MULTIVITAMIN) tablet Take 1 tablet by mouth daily.       . pravastatin (PRAVACHOL) 20 MG tablet Take 20 mg by mouth every evening.       Marland Kitchen PRILOSEC OTC 20 MG tablet Take 20 mg by mouth daily.       Marland Kitchen SYNTHROID 100 MCG tablet Take 100 mcg by mouth daily.       Marland Kitchen warfarin (COUMADIN) 2.5 MG tablet Take 1.25-2.5 mg by mouth daily. Alternates days taking 1 tab one day then 0.5 tab the next        OBJECTIVE: Elderly white woman in no acute distress Filed Vitals:   02/13/12 0831  BP: 110/73  Pulse: 79  Temp: 97.8 F (36.6 C)     Body mass index is 24.71 kg/(m^2).    ECOG FS: 2  Sclerae unicteric; left eye disconjugate Oropharynx clear No peripheral adenopathy Lungs no rales or rhonchi Heart regular rate and rhythm Abd benign MSK severe kyphosis and scoliosis, no focal spinal tenderness Neuro: nonfocal Breasts: Right breast status post lumpectomy and recent radiation; there is minimal erythema, no desquamation; there is no evidence of local recurrence; left breast is  unremarkable  LAB RESULTS: Lab Results  Component Value Date   WBC 7.8 11/19/2011   NEUTROABS 4.1 11/19/2011   HGB 12.9 11/19/2011   HCT 37.9 11/19/2011   MCV 85.4 11/19/2011   PLT 185 11/19/2011      Chemistry      Component Value Date/Time   NA 137 11/19/2011 1036   K 4.4 11/19/2011 1036   CL 102 11/19/2011 1036   CO2 29 11/19/2011 1036   BUN 27* 11/19/2011 1036   CREATININE 0.83 11/19/2011 1036      Component Value Date/Time   CALCIUM 9.3 11/19/2011 1036   ALKPHOS 62 11/19/2011 1036  AST 21 11/19/2011 1036   ALT 21 11/19/2011 1036   BILITOT 0.3 11/19/2011 1036       No results found for this basename: LABCA2    No components found with this basename: LABCA125    No results found for this basename: INR:1;PROTIME:1 in the last 168 hours  Urinalysis No results found for this basename: colorurine, appearanceur, labspec, phurine, glucoseu, hgbur, bilirubinur, ketonesur, proteinur, urobilinogen, nitrite, leukocytesur    STUDIES: No new results found.  ASSESSMENT: 76 year old [place] woman status post right lumpectomy and sentinel lymph node sampling December 2012 for a high-grade ductal carcinoma in situ, estrogen and progesterone receptor positive, treated adjuvantly with radiation completed mid March 2013  PLAN: We spent the better part of her hour-long visit today discussing her situation and she understands noninvasive breast cancer is not life threatening. Her risk of recurrence with surgery only he was in the 30% range, but she has cut that down to approximately 10% by receiving radiation. She took 5 years of anti-estrogen that would cut the risk again in half namely from 10% to about 5%. This means that 95 out of 100 women like her would not get any benefit from antiestrogen therapy.   In any case she understands that noninvasive breast cancer is not life threatening. It can only recur in the breast were it started, and if it did we can always do a mastectomy.  We then discussed  antiestrogen options and she understands that tamoxifen would not be a good choice for her because of her history of blood clots. Similarly at aromatase inhibitors would not be a good choice because of her history of severe osteoporosis.  Accordingly we decided the best thing to do is simply observe. She already sees Dr. Valentina Lucks twice a year in May and November, and will be seeing Dr. Donata Clay for followup of her aneurysm every January. She is going to see me every October shortly after her yearly mammography. We will do only review of systems and physical exam that visit. She has a very good understanding of this plan is in agreement.  Elizabella Nolet C    02/13/2012

## 2012-02-19 ENCOUNTER — Ambulatory Visit: Payer: Medicare Other | Admitting: Oncology

## 2012-02-19 ENCOUNTER — Other Ambulatory Visit: Payer: Medicare Other | Admitting: Lab

## 2012-02-20 DIAGNOSIS — Z961 Presence of intraocular lens: Secondary | ICD-10-CM | POA: Diagnosis not present

## 2012-02-21 DIAGNOSIS — L259 Unspecified contact dermatitis, unspecified cause: Secondary | ICD-10-CM | POA: Diagnosis not present

## 2012-02-21 DIAGNOSIS — L821 Other seborrheic keratosis: Secondary | ICD-10-CM | POA: Diagnosis not present

## 2012-02-27 ENCOUNTER — Encounter: Payer: Self-pay | Admitting: Radiation Oncology

## 2012-02-29 ENCOUNTER — Encounter: Payer: Self-pay | Admitting: Radiation Oncology

## 2012-02-29 ENCOUNTER — Ambulatory Visit
Admission: RE | Admit: 2012-02-29 | Discharge: 2012-02-29 | Disposition: A | Discharge status: Home / Self Care | Payer: Medicare Other | Source: Ambulatory Visit | Attending: Radiation Oncology | Admitting: Radiation Oncology

## 2012-02-29 VITALS — BP 119/81 | HR 74 | Temp 97.5°F | Resp 20 | Wt 127.8 lb

## 2012-02-29 DIAGNOSIS — C50419 Malignant neoplasm of upper-outer quadrant of unspecified female breast: Secondary | ICD-10-CM

## 2012-02-29 NOTE — Progress Notes (Signed)
Pt states her skin right breast "is healed, can hardly tell she had radiation. Getting over fatigue." no problems, c/o verbalized.

## 2012-03-01 NOTE — Progress Notes (Signed)
Radiation Oncology         [phone] ________________________________  Name: DIGNA COUNTESS MRN: [id number]  Date: 03/01/2012  DOB: 19-Jun-1936  Follow-Up Visit Note  CC: Lillia Mountain, MD, MD  Currie Paris, MD  Diagnosis:   Right-sided breast cancer  Interval Since Last Radiation:  One month   Narrative:  The patient returns today for routine follow-up.  The patient returns for routine followup. She states that she has done very well. Fatigue has been improving. She is pleased with how her skin has healed up in no ongoing complaints in this regard.                              ALLERGIES:  is allergic to contrast media; iohexol; and penicillins.  Meds: Current Outpatient Prescriptions  Medication Sig Dispense Refill  . amitriptyline (ELAVIL) 50 MG tablet Take 50 mg by mouth at bedtime.       Marland Kitchen amLODipine (NORVASC) 10 MG tablet Take 10 mg by mouth daily.       Marland Kitchen BONIVA 150 MG tablet Take 150 mg by mouth every 30 (thirty) days.       . calcium citrate-vitamin D (CITRACAL+D) 315-200 MG-UNIT per tablet Take 1 tablet by mouth 2 (two) times daily.       . cholecalciferol (VITAMIN D) 1000 UNITS tablet Take 1,000 Units by mouth daily. Vitamin D3      . ENABLEX 15 MG 24 hr tablet Take 15 mg by mouth daily.       Marland Kitchen HYDROcodone-acetaminophen (VICODIN) 5-500 MG per tablet Take 1 tablet by mouth every 8 (eight) hours as needed. For pain      . metoprolol succinate (TOPROL-XL) 25 MG 24 hr tablet Take 1 tablet (25 mg total) by mouth daily.  30 tablet  11  . Multiple Vitamin (MULTIVITAMIN) tablet Take 1 tablet by mouth daily.       . pravastatin (PRAVACHOL) 20 MG tablet Take 20 mg by mouth every evening.       Marland Kitchen PRILOSEC OTC 20 MG tablet Take 20 mg by mouth daily.       Marland Kitchen SYNTHROID 100 MCG tablet Take 100 mcg by mouth daily.       Marland Kitchen warfarin (COUMADIN) 2.5 MG tablet Take 1.25-2.5 mg by mouth daily. Alternates days taking 1 tab one day then 0.5 tab the next        Physical  Findings: The patient is in no acute distress. Patient is alert and oriented.  weight is 127 lb 12.8 oz (57.97 kg). Her oral temperature is 97.5 F (36.4 C). Her blood pressure is 119/81 and her pulse is 74. Her respiration is 20. Marland Kitchen   Patient's skin looks quite good. There remains some erythema especially in the upper medial aspect of the right breast. Overall her skin looks excellent without any desquamation or other issues.  Lab Findings: Lab Results  Component Value Date   WBC 7.8 11/19/2011   HGB 12.9 11/19/2011   HCT 37.9 11/19/2011   MCV 85.4 11/19/2011   PLT 185 11/19/2011     Radiographic Findings: No results found.  Impression:    Patient has done very well since radiation treatment with no signs of ongoing acute toxicity.  Plan:  The patient will return to clinic on a when necessary basis. She does have followup scheduled with other physicians including medical oncology.   Radene Gunning, M.D., Ph.D.

## 2012-03-07 DIAGNOSIS — M8448XD Pathological fracture, other site, subsequent encounter for fracture with routine healing: Secondary | ICD-10-CM | POA: Diagnosis not present

## 2012-03-07 DIAGNOSIS — Z7901 Long term (current) use of anticoagulants: Secondary | ICD-10-CM | POA: Diagnosis not present

## 2012-03-07 DIAGNOSIS — I10 Essential (primary) hypertension: Secondary | ICD-10-CM | POA: Diagnosis not present

## 2012-03-07 DIAGNOSIS — I82409 Acute embolism and thrombosis of unspecified deep veins of unspecified lower extremity: Secondary | ICD-10-CM | POA: Diagnosis not present

## 2012-06-05 ENCOUNTER — Telehealth (INDEPENDENT_AMBULATORY_CARE_PROVIDER_SITE_OTHER): Payer: Self-pay | Admitting: General Surgery

## 2012-06-05 NOTE — Telephone Encounter (Signed)
Message copied by Liliana Cline on Thu Jun 05, 2012  7:54 AM ------      Message from: Rise Paganini      Created: Wed Jun 04, 2012  3:31 PM      Regarding: Tomasita Crumble: [phone]       Patient stated that she's not sure if she will need her mammogram before or after her scheduled appointment on 07/23/12. Please call to discuss. Thank you.

## 2012-06-05 NOTE — Telephone Encounter (Signed)
Spoke with patient. She will call and set up her mammogram and then call and schedule her follow up after. Appt on 07/23/12 cancelled.

## 2012-06-05 NOTE — Telephone Encounter (Signed)
Last mammogram 07/29/12.

## 2012-06-05 NOTE — Telephone Encounter (Signed)
Left message on machine for patient to call back and ask for me. Patient needs yearly mammogram after 07/29/12 and should probably reschedule her appt for after she has her mammogram then.

## 2012-06-09 ENCOUNTER — Telehealth: Payer: Self-pay | Admitting: *Deleted

## 2012-06-09 NOTE — Telephone Encounter (Signed)
Pt called stating she needs note from Dr Mitzi Hansen stating she needs breast prosthesis supplies so her insurance will cover costs. Pt completed radiation tx 01/2012.  Pt has appt w/[employer] store on 06/16/12.  [employer] fax # [phone]. Informed pt will give Dr Mitzi Hansen this information.

## 2012-06-12 ENCOUNTER — Telehealth: Payer: Self-pay | Admitting: *Deleted

## 2012-06-12 NOTE — Telephone Encounter (Signed)
Called pt and informed her that note from Dr Mitzi Hansen re: breast prosthesis and bra from [employer] was faxed to store, [phone]. Pt verbalized understanding.

## 2012-07-23 ENCOUNTER — Ambulatory Visit (INDEPENDENT_AMBULATORY_CARE_PROVIDER_SITE_OTHER): Payer: Medicare Other | Admitting: Surgery

## 2012-07-30 DIAGNOSIS — Z853 Personal history of malignant neoplasm of breast: Secondary | ICD-10-CM | POA: Diagnosis not present

## 2012-07-31 ENCOUNTER — Encounter (INDEPENDENT_AMBULATORY_CARE_PROVIDER_SITE_OTHER): Payer: Self-pay

## 2012-08-08 ENCOUNTER — Ambulatory Visit (INDEPENDENT_AMBULATORY_CARE_PROVIDER_SITE_OTHER): Payer: Medicare Other | Admitting: Surgery

## 2012-08-08 ENCOUNTER — Encounter (INDEPENDENT_AMBULATORY_CARE_PROVIDER_SITE_OTHER): Payer: Self-pay | Admitting: Surgery

## 2012-08-08 VITALS — BP 112/78 | HR 80 | Temp 97.8°F | Resp 16 | Ht 60.0 in | Wt 128.8 lb

## 2012-08-08 DIAGNOSIS — Z853 Personal history of malignant neoplasm of breast: Secondary | ICD-10-CM | POA: Diagnosis not present

## 2012-08-08 NOTE — Progress Notes (Signed)
NAME: ANIA LEVAY       DOB: [birth year]           DATE: 08/08/2012       MRN: [id number]   [name] is a 76 y.o.Marland Kitchenfemale who presents for routine followup of her Right DCIS diagnosed in 2012 and treated with lumpectomy and radiation She has no problems or concerns on either side.She has elected no anti-estrogen therapy  PFSH: She has had no significant changes since the last visit here.  ROS: There have been no significant changes since the last visit here  EXAM: General: The patient is alert, oriented, generally healty appearing, NAD. Mood and affect are normal.  Breasts: Right breast is smaller and There is noticeable loss of tissue at the lumpectomy site, not firm. The breast is otherwise normal as is the left side  Lymphatics: She has no axillary or supraclavicular adenopathy on either side.  Extremities: Full ROM of the surgical side with no lymphedema noted.  Data Reviewed: 3 D mammogram was negative Reviewed her notes from Dr Tami Lin  Impression: Doing well, with no evidence of recurrent cancer or new cancer  Plan: Will continue to follow up here in six months.

## 2012-08-08 NOTE — Patient Instructions (Signed)
See me in six months, sooner if any problems

## 2012-08-27 DIAGNOSIS — L570 Actinic keratosis: Secondary | ICD-10-CM | POA: Diagnosis not present

## 2012-08-27 DIAGNOSIS — Z85828 Personal history of other malignant neoplasm of skin: Secondary | ICD-10-CM | POA: Diagnosis not present

## 2012-08-27 DIAGNOSIS — L821 Other seborrheic keratosis: Secondary | ICD-10-CM | POA: Diagnosis not present

## 2012-09-02 ENCOUNTER — Ambulatory Visit (HOSPITAL_BASED_OUTPATIENT_CLINIC_OR_DEPARTMENT_OTHER): Payer: Medicare Other | Admitting: Physician Assistant

## 2012-09-02 ENCOUNTER — Telehealth: Payer: Self-pay | Admitting: *Deleted

## 2012-09-02 ENCOUNTER — Encounter: Payer: Self-pay | Admitting: Physician Assistant

## 2012-09-02 VITALS — BP 115/75 | HR 81 | Temp 97.8°F | Resp 20 | Ht 60.0 in | Wt 129.2 lb

## 2012-09-02 DIAGNOSIS — Z170 Estrogen receptor positive status [ER+]: Secondary | ICD-10-CM

## 2012-09-02 DIAGNOSIS — D0590 Unspecified type of carcinoma in situ of unspecified breast: Secondary | ICD-10-CM | POA: Diagnosis not present

## 2012-09-02 DIAGNOSIS — C50919 Malignant neoplasm of unspecified site of unspecified female breast: Secondary | ICD-10-CM

## 2012-09-02 NOTE — Telephone Encounter (Signed)
Gave patient appointment for 09-03-2013 at 10:30am  Gave patient appointment for [facility] on 08-03-2013 at 10:30am

## 2012-09-02 NOTE — Progress Notes (Signed)
ID: [name]   DOB: [birth year]  MR#: [id number]  WUJ#:[id number]  HISTORY OF PRESENT ILLNESS: The patient had routine annual screening mammography at [facility] 07/30/2011 showing a new area of microcalcifications in the mid upper outer quadrant. She was brought back for additional views 08/20/2011, and biopsy the same day showed high-grade ductal carcinoma in situ. Breast MRI 08/27/2011 showed a 4.1 cm ill-defined area of non-masslike enhancement associated with biopsy clip. There were no other areas of concern, but incidentally a 4.7 cm ascending thoracic aortic aneurysm was noted.  On 10/17/2011 the patient underwent right lumpectomy and sentinel lymph node sampling ([id number]) for a high-grade ductal carcinoma in situ measuring at least 3.8 cm, with a negative but very close posterior margin and  negative sentinel lymph nodes. The tumor was 100% estrogen receptor positive and 48% progesterone receptor positive. Postoperatively the patient received of adjuvant radiation therapy completed 01/22/2012.   INTERVAL HISTORY: [name] returns today accompanied by her husband, [name], for routine six-month followup of her right breast carcinoma. She tells me that had a good summer. They visited [facility] several times. They continue to stay active, and go to the mall daily to walk.  REVIEW OF SYSTEMS: [name] is feeling well. She has had no recent illnesses and denies fevers, chills, or night sweats. She has no hot flashes. She is eating and drinking well and has no nausea or recent change in bowel or bladder habits. She denies any cough or increased shortness of breath. She's had no chest pain. No abnormal headaches or dizziness. She has some arthritic pain in her shoulders and some chronic back pain, none of which have worsened or changed. She denies any new myalgias, arthralgias, or bony pain, and has had no peripheral swelling.  A detailed review of systems is otherwise stable and  noncontributory.   PAST MEDICAL HISTORY: Past Medical History  Diagnosis Date  . GERD (gastroesophageal reflux disease)   . Hypercholesteremia   . Osteoporosis   . Thyroid disease   . Rib fractures   . Fracture lumbar vertebra-closed   . Fracture of thoracic vertebra, closed   . Cancer 10/12    bx/right breast  . History of blood clots   . Cataract 3 and 10/92    bilateral  . Hypertension     DR [name]  . Aneurysm   . Osteoporosis   . 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 DVT (deep venous thrombosis) 08/2011    LL extremity   . History of radiation therapy 01/2012    R breast   the patient had a blood clot in August of 2002 in the setting of hormone replacement. That was discontinued. In August of 2012, she had a second blood clot, which occurred shortly after a vertebral fracture curtailed her usual activity level. She had a left thigh growth removed approximately 10 years ago which initially was thought possibly to be a lymphoma but apparently that was never confirmed and there has been no recurrence of over the past 10 years  PAST SURGICAL HISTORY: Past Surgical History  Procedure Date  . Hernia repair 10/16/2006    [facility] - Dr Daphine Deutscher  . Appendectomy 1940  . Tonsillectomy 1944  . Cataract extraction 1992  . Eye surgery 1940, 1956  . Mastectomy, partial 10/17/2011    Procedure: MASTECTOMY PARTIAL;  Surgeon: Annette Paris, MD;  Location: [facility] OR;  Service: General;  Laterality: Right;  needle guided  . Breast  surgery     FAMILY HISTORY Family History  Problem Relation Age of Onset  . Heart disease Mother   . Heart disease Maternal Uncle   . Heart disease Maternal Grandfather    the patient's father died at the age of 18, the patient is not sure of the cause; the patient's mother died at the age of 34 with congestive heart failure. The patient had no brothers, 2 sisters. There is no history of breast or ovarian cancer in the family  GYNECOLOGIC  HISTORY: Menarche age 41, she is GX P0. She'll underwent menopause in her mid 27s. 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 Her husband [name] a used to be a Human resources officer and later Theatre stage manager for the [employer]. They are the only once at home, with no pets. The patient [facility] [employer]   ADVANCED DIRECTIVES: not in place  HEALTH MAINTENANCE: History  Substance Use Topics  . Smoking status: Never Smoker   . Smokeless tobacco: Never Used  . Alcohol Use: No     Colonoscopy: 2005 per pt  PAP: n/a  Bone density: 2011/ osteoporosis  Lipid panel: utd  Allergies  Allergen Reactions  . Contrast Media (Iodinated Diagnostic Agents) Shortness Of Breath  . Iohexol Shortness Of Breath  . Penicillins Rash    Current Outpatient Prescriptions  Medication Sig Dispense Refill  . amitriptyline (ELAVIL) 50 MG tablet Take 50 mg by mouth at bedtime.       Marland Kitchen amLODipine (NORVASC) 10 MG tablet Take 10 mg by mouth daily.       Marland Kitchen aspirin 81 MG tablet Take 81 mg by mouth daily.      Marland Kitchen BONIVA 150 MG tablet Take 150 mg by mouth every 30 (thirty) days.       . calcium citrate-vitamin D (CITRACAL+D) 315-200 MG-UNIT per tablet Take 1 tablet by mouth 2 (two) times daily.       . cholecalciferol (VITAMIN D) 1000 UNITS tablet Take 1,000 Units by mouth daily. Vitamin D3      . ENABLEX 15 MG 24 hr tablet Take 15 mg by mouth daily.       Marland Kitchen HYDROcodone-acetaminophen (VICODIN) 5-500 MG per tablet Take 1 tablet by mouth every 8 (eight) hours as needed. For pain      . metoprolol succinate (TOPROL-XL) 25 MG 24 hr tablet Take 1 tablet (25 mg total) by mouth daily.  30 tablet  11  . Multiple Vitamin (MULTIVITAMIN) tablet Take 1 tablet by mouth daily.       . pravastatin (PRAVACHOL) 20 MG tablet Take 20 mg by mouth every evening.       Marland Kitchen PRILOSEC OTC 20 MG tablet Take 20 mg  by mouth daily.       Marland Kitchen SYNTHROID 100 MCG tablet Take 100 mcg by mouth daily.         OBJECTIVE: Elderly white woman in no acute distress Filed Vitals:   09/02/12 1048  BP: 115/75  Pulse: 81  Temp: 97.8 F (36.6 C)  Resp: 20     Body mass index is 25.23 kg/(m^2).    ECOG FS: 0 Filed Weights   09/02/12 1048  Weight: 129 lb 3.2 oz (58.605 kg)   Sclerae unicteric Oropharynx clear No peripheral adenopathy Lungs no rales or rhonchi Heart regular rate and rhythm Abd soft, nontender with positive bowel sounds MSK: severe kyphosis and  scoliosis, no focal spinal tenderness Neuro: nonfocal, alert and oriented x3 Breasts: Right breast status post lumpectomy, no suspicious nodularities and no evidence of local recurrence; left breast is unremarkable. Axillae are benign bilaterally with no adenopathy palpated  LAB RESULTS: Lab Results  Component Value Date   WBC 7.8 11/19/2011   NEUTROABS 4.1 11/19/2011   HGB 12.9 11/19/2011   HCT 37.9 11/19/2011   MCV 85.4 11/19/2011   PLT 185 11/19/2011      Chemistry      Component Value Date/Time   NA 137 11/19/2011 1036   K 4.4 11/19/2011 1036   CL 102 11/19/2011 1036   CO2 29 11/19/2011 1036   BUN 27* 11/19/2011 1036   CREATININE 0.83 11/19/2011 1036      Component Value Date/Time   CALCIUM 9.3 11/19/2011 1036   ALKPHOS 62 11/19/2011 1036   AST 21 11/19/2011 1036   ALT 21 11/19/2011 1036   BILITOT 0.3 11/19/2011 1036      STUDIES: Most recent mammogram at [facility] on 07/30/2012 was unremarkable   ASSESSMENT: 76 year old [place] woman   (1)  status post right lumpectomy and sentinel lymph node sampling December 2012 for a high-grade ductal carcinoma in situ, estrogen and progesterone receptor positive,   (2)  treated adjuvantly with radiation completed mid March 2013  (3) Now followed with observation alone  PLAN:  Per Emylia's previous discussion with Dr. Darnelle Catalan, we will continue to follow her with observation alone. She'll be due for her next  mammogram at [facility] in September 2014, and we'll see Dr. Darnelle Catalan soon thereafter for physical exam only.  Patient voices understanding and agreement with our plan, and will call any changes or problems.   Ulysse Siemen    09/02/2012

## 2012-09-02 NOTE — Patient Instructions (Signed)
Return in one year for follow up visit (Oct 2014)  Mammogram will be due at [facility] in Sept 2014.

## 2012-09-12 DIAGNOSIS — E039 Hypothyroidism, unspecified: Secondary | ICD-10-CM | POA: Diagnosis not present

## 2012-09-12 DIAGNOSIS — IMO0001 Reserved for inherently not codable concepts without codable children: Secondary | ICD-10-CM | POA: Diagnosis not present

## 2012-09-12 DIAGNOSIS — K219 Gastro-esophageal reflux disease without esophagitis: Secondary | ICD-10-CM | POA: Diagnosis not present

## 2012-09-12 DIAGNOSIS — Z23 Encounter for immunization: Secondary | ICD-10-CM | POA: Diagnosis not present

## 2012-09-12 DIAGNOSIS — I10 Essential (primary) hypertension: Secondary | ICD-10-CM | POA: Diagnosis not present

## 2012-09-12 DIAGNOSIS — M549 Dorsalgia, unspecified: Secondary | ICD-10-CM | POA: Diagnosis not present

## 2012-09-19 DIAGNOSIS — M25539 Pain in unspecified wrist: Secondary | ICD-10-CM | POA: Diagnosis not present

## 2012-09-29 DIAGNOSIS — N952 Postmenopausal atrophic vaginitis: Secondary | ICD-10-CM | POA: Diagnosis not present

## 2012-09-29 DIAGNOSIS — N318 Other neuromuscular dysfunction of bladder: Secondary | ICD-10-CM | POA: Diagnosis not present

## 2012-11-13 ENCOUNTER — Other Ambulatory Visit: Payer: Self-pay | Admitting: Cardiothoracic Surgery

## 2012-11-13 DIAGNOSIS — I712 Thoracic aortic aneurysm, without rupture: Secondary | ICD-10-CM

## 2012-11-17 DIAGNOSIS — E039 Hypothyroidism, unspecified: Secondary | ICD-10-CM | POA: Diagnosis not present

## 2012-11-20 ENCOUNTER — Other Ambulatory Visit: Payer: Self-pay | Admitting: *Deleted

## 2012-11-20 DIAGNOSIS — Z91041 Radiographic dye allergy status: Secondary | ICD-10-CM

## 2012-11-20 MED ORDER — DIPHENHYDRAMINE HCL 25 MG PO TABS: 50.0000 mg | ORAL_TABLET | Freq: Once | ORAL | Status: DC

## 2012-11-20 MED ORDER — PREDNISONE 50 MG PO TABS: ORAL_TABLET | ORAL | Status: DC

## 2012-12-04 ENCOUNTER — Ambulatory Visit (HOSPITAL_COMMUNITY)
Admission: RE | Admit: 2012-12-04 | Discharge: 2012-12-04 | Disposition: A | Discharge status: Home / Self Care | Payer: Medicare Other | Source: Ambulatory Visit | Attending: Cardiothoracic Surgery | Admitting: Cardiothoracic Surgery

## 2012-12-04 DIAGNOSIS — I712 Thoracic aortic aneurysm, without rupture, unspecified: Secondary | ICD-10-CM | POA: Insufficient documentation

## 2012-12-04 LAB — CREATININE, SERUM
Creatinine, Ser: 0.83 mg/dL (ref 0.50–1.10)
GFR calc Af Amer: 79 mL/min — ABNORMAL LOW (ref >90)
GFR calc non Af Amer: 68 mL/min — ABNORMAL LOW (ref >90)

## 2012-12-04 LAB — BUN: BUN: 31 mg/dL — ABNORMAL HIGH (ref 6–23)

## 2012-12-04 MED ORDER — IOHEXOL 350 MG/ML SOLN
100.0000 mL | Freq: Once | INTRAVENOUS | Status: AC | PRN
  Administered 2012-12-04: 80 mL via INTRAVENOUS

## 2012-12-10 ENCOUNTER — Ambulatory Visit: Payer: Medicare Other | Admitting: Cardiothoracic Surgery

## 2012-12-24 ENCOUNTER — Encounter: Payer: Medicare Other | Admitting: Cardiothoracic Surgery

## 2013-01-07 ENCOUNTER — Encounter: Payer: Self-pay | Admitting: Cardiothoracic Surgery

## 2013-01-07 ENCOUNTER — Ambulatory Visit (INDEPENDENT_AMBULATORY_CARE_PROVIDER_SITE_OTHER): Payer: Medicare Other | Admitting: Cardiothoracic Surgery

## 2013-01-07 VITALS — BP 110/76 | HR 102 | Resp 20 | Ht 60.0 in | Wt 129.0 lb

## 2013-01-07 DIAGNOSIS — I712 Thoracic aortic aneurysm, without rupture: Secondary | ICD-10-CM | POA: Diagnosis not present

## 2013-01-07 NOTE — Progress Notes (Signed)
PCP is Lillia Mountain, MD Referring Provider is Lillia Mountain, MD  Chief Complaint  Patient presents with  . Thoracic Aortic Aneurysm    1 year f/i with CTA Chest, surveillance of  moderate ascending fusiform aneurysm of the thoracic aorta    HPI: Fusiform ascending thoracic aneurysm followed since 2012 measuring 4.5 cm without change. No evidence of intramural hematoma or ulceration or dissection. She denies chest pain. Patient is on a beta blocker for her hypertension. Over the past year she received treatment for breast cancer including lumpectomy radiation and chemotherapy is now finished.  CTA of the thoracic aorta shows no change in the diameter over the past year. There is no evidence of pulmonary emboli-the patient does have history DVT.  Of note the patient has noted dyspnea on exertion walking to the mailbox and the cold air this winter. She states she had a stress test performed 1-2 years ago at [facility] which was negative. The current CTA shows some evidence of coronary calcification of the proximal LAD and distal left main. She denies any chest tightness or discomfort with her dyspnea on exertion.   Past Medical History  Diagnosis Date  . GERD (gastroesophageal reflux disease)   . Hypercholesteremia   . Osteoporosis   . Thyroid disease   . Rib fractures   . Fracture lumbar vertebra-closed   . Fracture of thoracic vertebra, closed   . Cancer 10/12    bx/right breast  . History of blood clots   . Cataract 3 and 10/92    bilateral  . Hypertension     DR Terrilee Files  . Aneurysm   . Osteoporosis   . 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 DVT (deep venous thrombosis) 08/2011    LL extremity   . History of radiation therapy 01/2012    R breast    Past Surgical History  Procedure Laterality Date  . Hernia repair  10/16/2006    [facility] - Dr Daphine Deutscher  . Appendectomy  1940  . Tonsillectomy  1944  . Cataract extraction  1992  . Eye surgery   1940, 1956  . Mastectomy, partial  10/17/2011    Procedure: MASTECTOMY PARTIAL;  Surgeon: Currie Paris, MD;  Location: [facility] OR;  Service: General;  Laterality: Right;  needle guided  . Breast surgery      Family History  Problem Relation Age of Onset  . Heart disease Mother   . Heart disease Maternal Uncle   . Heart disease Maternal Grandfather     Social History History  Substance Use Topics  . Smoking status: Never Smoker   . Smokeless tobacco: Never Used  . Alcohol Use: No    Current Outpatient Prescriptions  Medication Sig Dispense Refill  . amitriptyline (ELAVIL) 50 MG tablet Take 50 mg by mouth at bedtime.       Marland Kitchen amLODipine (NORVASC) 10 MG tablet Take 10 mg by mouth daily.       Marland Kitchen aspirin 81 MG tablet Take 81 mg by mouth daily.      Marland Kitchen BONIVA 150 MG tablet Take 150 mg by mouth every 30 (thirty) days.       . calcium citrate-vitamin D (CITRACAL+D) 315-200 MG-UNIT per tablet Take 1 tablet by mouth 2 (two) times daily.       . cholecalciferol (VITAMIN D) 1000 UNITS tablet Take 1,000 Units by mouth daily. Vitamin D3      . diphenhydrAMINE (BENADRYL) 25 MG  tablet Take 2 tablets (50 mg total) by mouth once. Take Benadryl 50 mgm @ 9:30 am 12/04/12  30 tablet  0  . ENABLEX 15 MG 24 hr tablet Take 15 mg by mouth daily.       Marland Kitchen HYDROcodone-acetaminophen (VICODIN) 5-500 MG per tablet Take 1 tablet by mouth every 8 (eight) hours as needed. For pain      . metoprolol succinate (TOPROL-XL) 25 MG 24 hr tablet Take 1 tablet (25 mg total) by mouth daily.  30 tablet  11  . Multiple Vitamin (MULTIVITAMIN) tablet Take 1 tablet by mouth daily.       . pravastatin (PRAVACHOL) 20 MG tablet Take 20 mg by mouth every evening.       Marland Kitchen PRILOSEC OTC 20 MG tablet Take 20 mg by mouth daily.       Marland Kitchen SYNTHROID 100 MCG tablet Take 100 mcg by mouth daily.        No current facility-administered medications for this visit.    Allergies  Allergen Reactions  . Contrast Media (Iodinated Diagnostic  Agents) Shortness Of Breath  . Iohexol Shortness Of Breath  . Penicillins Rash    Review of Systems dyspnea on exertion this as noted this winter without chest pain-no evidence of PE on recent CTA  BP 110/76  Pulse 102  Resp 20  Ht 5' (1.524 m)  Wt 129 lb (58.514 kg)  BMI 25.19 kg/m2  SpO2 96% Physical Exam Alert and comfortable Lungs clear Heart rhythm regular No gallop or murmur Stable lower Chairman edema  Diagnostic Tests: CTA shows ascending aortic diameter stable at 4.5 cm  Impression: Moderate fusiform ascending aneurysm, continue to follow and manage conservatively  Plan: Return for CTA in one year The patient was encouraged to contact her primary physician Dr. Valentina Lucks if her dyspnea exertion persists due to the potential of significant coronary disease based on findings noted at recent CTA to evaluate her ascending aorta

## 2013-01-12 DIAGNOSIS — S22009A Unspecified fracture of unspecified thoracic vertebra, initial encounter for closed fracture: Secondary | ICD-10-CM | POA: Diagnosis not present

## 2013-01-13 ENCOUNTER — Other Ambulatory Visit: Payer: Self-pay | Admitting: *Deleted

## 2013-01-13 DIAGNOSIS — I712 Thoracic aortic aneurysm, without rupture: Secondary | ICD-10-CM

## 2013-01-13 MED ORDER — METOPROLOL SUCCINATE ER 25 MG PO TB24: 25.0000 mg | ORAL_TABLET | Freq: Every day | ORAL | Status: DC

## 2013-01-14 DIAGNOSIS — K5900 Constipation, unspecified: Secondary | ICD-10-CM | POA: Diagnosis not present

## 2013-01-15 ENCOUNTER — Other Ambulatory Visit: Payer: Self-pay | Admitting: Neurosurgery

## 2013-01-20 ENCOUNTER — Encounter (HOSPITAL_COMMUNITY): Payer: Self-pay | Admitting: Emergency Medicine

## 2013-01-20 ENCOUNTER — Emergency Department (HOSPITAL_COMMUNITY): Payer: Medicare Other

## 2013-01-20 ENCOUNTER — Inpatient Hospital Stay (HOSPITAL_COMMUNITY)
Admission: EM | Admit: 2013-01-20 | Discharge: 2013-01-28 | DRG: 517 | Disposition: A | Discharge status: Skilled Nursing Facility | Payer: Medicare Other | Attending: Internal Medicine | Admitting: Internal Medicine

## 2013-01-20 DIAGNOSIS — M810 Age-related osteoporosis without current pathological fracture: Secondary | ICD-10-CM | POA: Diagnosis present

## 2013-01-20 DIAGNOSIS — E039 Hypothyroidism, unspecified: Secondary | ICD-10-CM | POA: Diagnosis not present

## 2013-01-20 DIAGNOSIS — Z853 Personal history of malignant neoplasm of breast: Secondary | ICD-10-CM | POA: Diagnosis not present

## 2013-01-20 DIAGNOSIS — IMO0001 Reserved for inherently not codable concepts without codable children: Secondary | ICD-10-CM | POA: Diagnosis present

## 2013-01-20 DIAGNOSIS — Z79899 Other long term (current) drug therapy: Secondary | ICD-10-CM | POA: Diagnosis not present

## 2013-01-20 DIAGNOSIS — R0609 Other forms of dyspnea: Secondary | ICD-10-CM | POA: Diagnosis not present

## 2013-01-20 DIAGNOSIS — K5909 Other constipation: Secondary | ICD-10-CM | POA: Diagnosis present

## 2013-01-20 DIAGNOSIS — R609 Edema, unspecified: Secondary | ICD-10-CM | POA: Diagnosis not present

## 2013-01-20 DIAGNOSIS — R269 Unspecified abnormalities of gait and mobility: Secondary | ICD-10-CM | POA: Diagnosis present

## 2013-01-20 DIAGNOSIS — R0600 Dyspnea, unspecified: Secondary | ICD-10-CM

## 2013-01-20 DIAGNOSIS — K449 Diaphragmatic hernia without obstruction or gangrene: Secondary | ICD-10-CM | POA: Diagnosis present

## 2013-01-20 DIAGNOSIS — C50919 Malignant neoplasm of unspecified site of unspecified female breast: Secondary | ICD-10-CM | POA: Diagnosis not present

## 2013-01-20 DIAGNOSIS — I10 Essential (primary) hypertension: Secondary | ICD-10-CM | POA: Diagnosis not present

## 2013-01-20 DIAGNOSIS — Z86718 Personal history of other venous thrombosis and embolism: Secondary | ICD-10-CM | POA: Diagnosis not present

## 2013-01-20 DIAGNOSIS — F411 Generalized anxiety disorder: Secondary | ICD-10-CM | POA: Diagnosis present

## 2013-01-20 DIAGNOSIS — Z923 Personal history of irradiation: Secondary | ICD-10-CM | POA: Diagnosis not present

## 2013-01-20 DIAGNOSIS — M545 Low back pain, unspecified: Secondary | ICD-10-CM | POA: Diagnosis not present

## 2013-01-20 DIAGNOSIS — T4275XA Adverse effect of unspecified antiepileptic and sedative-hypnotic drugs, initial encounter: Secondary | ICD-10-CM | POA: Diagnosis present

## 2013-01-20 DIAGNOSIS — Z7982 Long term (current) use of aspirin: Secondary | ICD-10-CM

## 2013-01-20 DIAGNOSIS — M4000 Postural kyphosis, site unspecified: Secondary | ICD-10-CM | POA: Diagnosis not present

## 2013-01-20 DIAGNOSIS — M539 Dorsopathy, unspecified: Secondary | ICD-10-CM | POA: Diagnosis not present

## 2013-01-20 DIAGNOSIS — IMO0002 Reserved for concepts with insufficient information to code with codable children: Secondary | ICD-10-CM | POA: Diagnosis not present

## 2013-01-20 DIAGNOSIS — M8448XA Pathological fracture, other site, initial encounter for fracture: Principal | ICD-10-CM | POA: Diagnosis present

## 2013-01-20 DIAGNOSIS — K219 Gastro-esophageal reflux disease without esophagitis: Secondary | ICD-10-CM | POA: Diagnosis not present

## 2013-01-20 DIAGNOSIS — S22009A Unspecified fracture of unspecified thoracic vertebra, initial encounter for closed fracture: Secondary | ICD-10-CM | POA: Diagnosis not present

## 2013-01-20 DIAGNOSIS — Z91041 Radiographic dye allergy status: Secondary | ICD-10-CM

## 2013-01-20 DIAGNOSIS — R0602 Shortness of breath: Secondary | ICD-10-CM | POA: Diagnosis not present

## 2013-01-20 DIAGNOSIS — C50911 Malignant neoplasm of unspecified site of right female breast: Secondary | ICD-10-CM

## 2013-01-20 DIAGNOSIS — E7800 Pure hypercholesterolemia, unspecified: Secondary | ICD-10-CM | POA: Diagnosis present

## 2013-01-20 DIAGNOSIS — Z5189 Encounter for other specified aftercare: Secondary | ICD-10-CM | POA: Diagnosis not present

## 2013-01-20 DIAGNOSIS — R600 Localized edema: Secondary | ICD-10-CM

## 2013-01-20 DIAGNOSIS — M546 Pain in thoracic spine: Secondary | ICD-10-CM | POA: Diagnosis not present

## 2013-01-20 DIAGNOSIS — K5900 Constipation, unspecified: Secondary | ICD-10-CM | POA: Diagnosis not present

## 2013-01-20 DIAGNOSIS — I82409 Acute embolism and thrombosis of unspecified deep veins of unspecified lower extremity: Secondary | ICD-10-CM | POA: Diagnosis not present

## 2013-01-20 HISTORY — DX: Fibromyalgia: M79.7

## 2013-01-20 HISTORY — DX: Anxiety disorder, unspecified: F41.9

## 2013-01-20 HISTORY — DX: Hypothyroidism, unspecified: E03.9

## 2013-01-20 HISTORY — DX: Personal history of other diseases of the digestive system: Z87.19

## 2013-01-20 LAB — CBC
HCT: 36.2 % (ref 36.0–46.0)
MCHC: 35.4 g/dL (ref 30.0–36.0)
Platelets: 244 10*3/uL (ref 150–400)
RDW: 13.1 % (ref 11.5–15.5)
WBC: 10 10*3/uL (ref 4.0–10.5)

## 2013-01-20 LAB — BASIC METABOLIC PANEL
BUN: 21 mg/dL (ref 6–23)
Chloride: 97 mEq/L (ref 96–112)
GFR calc Af Amer: >90 mL/min (ref >90)
GFR calc non Af Amer: 80 mL/min — ABNORMAL LOW (ref >90)
Potassium: 3.7 mEq/L (ref 3.5–5.1)
Sodium: 136 mEq/L (ref 135–145)

## 2013-01-20 LAB — D-DIMER, QUANTITATIVE: D-Dimer, Quant: 1.77 ug/mL-FEU — ABNORMAL HIGH (ref 0.00–0.48)

## 2013-01-20 LAB — PROTIME-INR: Prothrombin Time: 13.3 seconds (ref 11.6–15.2)

## 2013-01-20 NOTE — ED Notes (Signed)
Patient with shortness of breath, increasing in the last three weeks.  Patient states she has just noticed it.  Patient spoke with Dr Nehemiah Settle, he sent her to ED.  Patient states her bloodwork was abnormal at his office today.  Patient was being checked for DVT.  Patient has bilat lower leg swelling with pain.  Patient states she has history of blood clots.  Patient has been off her coumadin about one month.

## 2013-01-21 ENCOUNTER — Observation Stay (HOSPITAL_COMMUNITY): Payer: Medicare Other

## 2013-01-21 ENCOUNTER — Encounter (HOSPITAL_COMMUNITY): Payer: Self-pay

## 2013-01-21 ENCOUNTER — Emergency Department (HOSPITAL_COMMUNITY): Payer: Medicare Other

## 2013-01-21 DIAGNOSIS — C50919 Malignant neoplasm of unspecified site of unspecified female breast: Secondary | ICD-10-CM

## 2013-01-21 DIAGNOSIS — M545 Low back pain: Secondary | ICD-10-CM | POA: Diagnosis present

## 2013-01-21 DIAGNOSIS — Z86718 Personal history of other venous thrombosis and embolism: Secondary | ICD-10-CM | POA: Diagnosis not present

## 2013-01-21 DIAGNOSIS — R0989 Other specified symptoms and signs involving the circulatory and respiratory systems: Secondary | ICD-10-CM | POA: Diagnosis not present

## 2013-01-21 DIAGNOSIS — R0600 Dyspnea, unspecified: Secondary | ICD-10-CM | POA: Diagnosis present

## 2013-01-21 DIAGNOSIS — I10 Essential (primary) hypertension: Secondary | ICD-10-CM | POA: Diagnosis present

## 2013-01-21 DIAGNOSIS — R609 Edema, unspecified: Secondary | ICD-10-CM

## 2013-01-21 DIAGNOSIS — M810 Age-related osteoporosis without current pathological fracture: Secondary | ICD-10-CM | POA: Diagnosis present

## 2013-01-21 DIAGNOSIS — E039 Hypothyroidism, unspecified: Secondary | ICD-10-CM

## 2013-01-21 DIAGNOSIS — R600 Localized edema: Secondary | ICD-10-CM | POA: Diagnosis present

## 2013-01-21 DIAGNOSIS — K5900 Constipation, unspecified: Secondary | ICD-10-CM | POA: Diagnosis not present

## 2013-01-21 DIAGNOSIS — R0609 Other forms of dyspnea: Secondary | ICD-10-CM | POA: Diagnosis not present

## 2013-01-21 DIAGNOSIS — M8448XA Pathological fracture, other site, initial encounter for fracture: Secondary | ICD-10-CM | POA: Diagnosis not present

## 2013-01-21 LAB — HEPARIN LEVEL (UNFRACTIONATED): Heparin Unfractionated: 0.49 IU/mL (ref 0.30–0.70)

## 2013-01-21 LAB — PRO B NATRIURETIC PEPTIDE: Pro B Natriuretic peptide (BNP): 566.1 pg/mL — ABNORMAL HIGH (ref 0–125)

## 2013-01-21 MED ORDER — ENOXAPARIN SODIUM 40 MG/0.4ML ~~LOC~~ SOLN
40.0000 mg | SUBCUTANEOUS | Status: DC
  Administered 2013-01-21 – 2013-01-27 (×7): 40 mg via SUBCUTANEOUS
  Filled 2013-01-21 (×8): qty 0.4

## 2013-01-21 MED ORDER — DOCUSATE SODIUM 100 MG PO CAPS
100.0000 mg | ORAL_CAPSULE | Freq: Two times a day (BID) | ORAL | Status: DC
  Administered 2013-01-22 – 2013-01-27 (×11): 100 mg via ORAL
  Filled 2013-01-21 (×16): qty 1

## 2013-01-21 MED ORDER — ACETAMINOPHEN 325 MG PO TABS: 650.0000 mg | ORAL_TABLET | Freq: Four times a day (QID) | ORAL | Status: DC | PRN

## 2013-01-21 MED ORDER — SIMVASTATIN 10 MG PO TABS
10.0000 mg | ORAL_TABLET | Freq: Every day | ORAL | Status: DC
  Administered 2013-01-21 – 2013-01-27 (×7): 10 mg via ORAL
  Filled 2013-01-21 (×9): qty 1

## 2013-01-21 MED ORDER — AMITRIPTYLINE HCL 50 MG PO TABS
50.0000 mg | ORAL_TABLET | Freq: Every day | ORAL | Status: DC
  Administered 2013-01-21 – 2013-01-27 (×7): 50 mg via ORAL
  Filled 2013-01-21 (×8): qty 1

## 2013-01-21 MED ORDER — HYDROCODONE-ACETAMINOPHEN 5-325 MG PO TABS
1.0000 | ORAL_TABLET | ORAL | Status: DC | PRN
  Administered 2013-01-21 – 2013-01-28 (×17): 1 via ORAL
  Filled 2013-01-21 (×20): qty 1

## 2013-01-21 MED ORDER — HEPARIN BOLUS VIA INFUSION
3000.0000 [IU] | Freq: Once | INTRAVENOUS | Status: AC
  Administered 2013-01-21: 3000 [IU] via INTRAVENOUS

## 2013-01-21 MED ORDER — POLYETHYLENE GLYCOL 3350 17 G PO PACK
17.0000 g | PACK | Freq: Two times a day (BID) | ORAL | Status: DC
  Administered 2013-01-22 – 2013-01-27 (×9): 17 g via ORAL
  Filled 2013-01-21 (×16): qty 1

## 2013-01-21 MED ORDER — PANTOPRAZOLE SODIUM 40 MG PO TBEC
40.0000 mg | DELAYED_RELEASE_TABLET | Freq: Every day | ORAL | Status: DC
  Administered 2013-01-21 – 2013-01-28 (×8): 40 mg via ORAL
  Filled 2013-01-21 (×7): qty 1

## 2013-01-21 MED ORDER — SODIUM CHLORIDE 0.9 % IJ SOLN
3.0000 mL | Freq: Two times a day (BID) | INTRAMUSCULAR | Status: DC
  Administered 2013-01-21 – 2013-01-28 (×11): 3 mL via INTRAVENOUS

## 2013-01-21 MED ORDER — HEPARIN (PORCINE) IN NACL 100-0.45 UNIT/ML-% IJ SOLN
950.0000 [IU]/h | INTRAMUSCULAR | Status: DC
  Administered 2013-01-21: 950 [IU]/h via INTRAVENOUS
  Filled 2013-01-21 (×2): qty 250

## 2013-01-21 MED ORDER — SODIUM CHLORIDE 0.9 % IV SOLN
INTRAVENOUS | Status: DC
  Administered 2013-01-21: 05:00:00 via INTRAVENOUS

## 2013-01-21 MED ORDER — ACETAMINOPHEN 650 MG RE SUPP: 650.0000 mg | Freq: Four times a day (QID) | RECTAL | Status: DC | PRN

## 2013-01-21 MED ORDER — LEVOTHYROXINE SODIUM 112 MCG PO TABS
112.0000 ug | ORAL_TABLET | Freq: Every day | ORAL | Status: DC
  Administered 2013-01-21 – 2013-01-28 (×8): 112 ug via ORAL
  Filled 2013-01-21 (×10): qty 1

## 2013-01-21 MED ORDER — OMEPRAZOLE MAGNESIUM 20 MG PO TBEC: 20.0000 mg | DELAYED_RELEASE_TABLET | Freq: Every day | ORAL | Status: DC

## 2013-01-21 MED ORDER — AMLODIPINE BESYLATE 10 MG PO TABS
10.0000 mg | ORAL_TABLET | Freq: Every day | ORAL | Status: DC
  Administered 2013-01-21 – 2013-01-26 (×6): 10 mg via ORAL
  Filled 2013-01-21 (×8): qty 1

## 2013-01-21 MED ORDER — METOPROLOL SUCCINATE ER 25 MG PO TB24
25.0000 mg | ORAL_TABLET | Freq: Every day | ORAL | Status: DC
  Administered 2013-01-21 – 2013-01-28 (×7): 25 mg via ORAL
  Filled 2013-01-21 (×8): qty 1

## 2013-01-21 MED ORDER — VITAMIN D3 25 MCG (1000 UNIT) PO TABS
1000.0000 [IU] | ORAL_TABLET | Freq: Every day | ORAL | Status: DC
  Administered 2013-01-21 – 2013-01-28 (×8): 1000 [IU] via ORAL
  Filled 2013-01-21 (×8): qty 1

## 2013-01-21 MED ORDER — SENNA 8.6 MG PO TABS
1.0000 | ORAL_TABLET | Freq: Two times a day (BID) | ORAL | Status: DC
  Administered 2013-01-22 – 2013-01-27 (×10): 8.6 mg via ORAL
  Filled 2013-01-21 (×16): qty 1

## 2013-01-21 MED ORDER — DARIFENACIN HYDROBROMIDE ER 15 MG PO TB24
15.0000 mg | ORAL_TABLET | Freq: Every day | ORAL | Status: DC
  Administered 2013-01-21 – 2013-01-28 (×8): 15 mg via ORAL
  Filled 2013-01-21 (×8): qty 1

## 2013-01-21 NOTE — Progress Notes (Signed)
ANTICOAGULATION CONSULT NOTE - Initial Consult  Pharmacy Consult for Heparin  Indication: PE  Allergies  Allergen Reactions  . Contrast Media (Iodinated Diagnostic Agents) Shortness Of Breath  . Iohexol Shortness Of Breath  . Penicillins Rash    Patient Measurements: Height: 4' 11.84" (152 cm) Weight: 128 lb 15.5 oz (58.5 kg) IBW/kg (Calculated) : 45.14  Vital Signs: Temp: 98.1 F (36.7 C) (03/12 0017) Temp src: Oral (03/12 0017) BP: 137/68 mmHg (03/12 0017) Pulse Rate: 98 (03/11 2220)  Labs:  Recent Labs  01/20/13 2225  HGB 12.8  HCT 36.2  PLT 244  LABPROT 13.3  INR 1.02  CREATININE 0.79    Estimated Creatinine Clearance: 49.2 ml/min (by C-G formula based on Cr of 0.79).   Medical History: Past Medical History  Diagnosis Date  . GERD (gastroesophageal reflux disease)   . Hypercholesteremia   . Osteoporosis   . Thyroid disease   . Rib fractures   . Fracture lumbar vertebra-closed   . Fracture of thoracic vertebra, closed   . Cancer 10/12    bx/right breast  . History of blood clots   . Cataract 3 and 10/92    bilateral  . Hypertension     DR Terrilee Files  . Aneurysm   . Osteoporosis   . 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 DVT (deep venous thrombosis) 08/2011    LL extremity   . History of radiation therapy 01/2012    R breast    Medications:  Elavil  Norvasc  ASA  Boniva  Vit D  Enablex  Synthroid  Toprol  MVI  Pravachol  Prilosec  Assessment: 77 yo female with PE for heparin  Goal of Therapy:  Heparin level 0.3-0.7 units/ml Monitor platelets by anticoagulation protocol: Yes   Plan:  Heparin 3000 units VI bolus, then 950 units/hr Check heparin level in 6 hours.   Nyasiah Moffet, Gary Fleet 01/21/2013,2:31 AM

## 2013-01-21 NOTE — Progress Notes (Signed)
ANTICOAGULATION CONSULT NOTE - Follow Up Consult  Pharmacy Consult for heparin Indication: pulmonary embolus  Allergies  Allergen Reactions  . Contrast Media (Iodinated Diagnostic Agents) Shortness Of Breath  . Iohexol Shortness Of Breath  . Penicillins Rash    Patient Measurements: Height: 5' (152.4 cm) Weight: 136 lb 7.4 oz (61.9 kg) IBW/kg (Calculated) : 45.5  Vital Signs: Temp: 98.7 F (37.1 C) (03/12 1357) Temp src: Oral (03/12 1357) BP: 117/66 mmHg (03/12 1357) Pulse Rate: 98 (03/12 1357)  Labs:  Recent Labs  01/20/13 2225 01/21/13 1048 01/21/13 1749  HGB 12.8  --   --   HCT 36.2  --   --   PLT 244  --   --   LABPROT 13.3  --   --   INR 1.02  --   --   HEPARINUNFRC  --  0.37 0.49  CREATININE 0.79  --   --     Estimated Creatinine Clearance: 50.7 ml/min (by C-G formula based on Cr of 0.79).   Medications:  Scheduled:  . amitriptyline  50 mg Oral QHS  . amLODipine  10 mg Oral Daily  . cholecalciferol  1,000 Units Oral Daily  . darifenacin  15 mg Oral Daily  . docusate sodium  100 mg Oral BID  . enoxaparin (LOVENOX) injection  40 mg Subcutaneous Q24H  . [COMPLETED] heparin  3,000 Units Intravenous Once  . levothyroxine  112 mcg Oral QAC breakfast  . metoprolol succinate  25 mg Oral Daily  . pantoprazole  40 mg Oral Daily  . polyethylene glycol  17 g Oral BID  . senna  1 tablet Oral BID  . simvastatin  10 mg Oral q1800  . sodium chloride  3 mL Intravenous Q12H  . [COMPLETED] sodium chloride   Intravenous STAT  . [DISCONTINUED] omeprazole  20 mg Oral Daily    Assessment: 77 yo female on heparin for concern of PE (Dopplers have been completed and are negative). Heparin level remains at goal (HL= 0.49) on 950 units/hr  Goal of Therapy:  Heparin level 0.3-0.7 units/ml Monitor platelets by anticoagulation protocol: Yes   Plan:  -No heparin changes needed -Heparin level and CBC daily  Harland German, Pharm D 01/21/2013 7:31 PM

## 2013-01-21 NOTE — ED Notes (Signed)
CT Angio chest not completed due to pt allergy to contrast media.  [name] aware.  Heparin started, pt denies any complaints at present.

## 2013-01-21 NOTE — Progress Notes (Signed)
*  Preliminary Results* Bilateral lower extremity venous duplex completed. Bilateral lower extremities are negative for deep vein thrombosis. No evidence of Baker's cyst bilaterally.  01/21/2013 10:10 AM Gertie Fey, RDMS, RDCS

## 2013-01-21 NOTE — ED Notes (Signed)
Pt presents for evaluation of increasing shortness of breath.  Pt was seen by PCP today due to pitting edema bilateral legs, noticed legs began weeping and went to PCP.  Tonight PCP called pt and instructed her to go to ED due to abnormal blood work.  Pt also with chronic back pain.  Pt in gown, placed on cardiac monitor- will continue to monitor.

## 2013-01-21 NOTE — ED Provider Notes (Signed)
History     CSN: [id number]  Arrival date & time 01/20/13  2155   First MD Initiated Contact with Patient 01/20/13 2345      Chief Complaint  Patient presents with  . Shortness of Breath    (Consider location/radiation/quality/duration/timing/severity/associated sxs/prior treatment) HPI  Patient presents with ongoing dyspnea, worsening lower extremity edema.  She has a notable history of prior DVT.  She is not currently taking Coumadin.  She states approximately 3 days ago she noticed increasing swelling in both lower extremities, more pronounced on the left.  There is associated pain, and bleeding from the left leg.  There is no distal dysesthesia or weakness.  For a slightly longer time the patient has had new dyspnea on exertion, new fatigue.  She specifically denies chest pain, abdominal pain, nausea, syncope, confusion.  The patient saw her physician today, was referred here for further evaluation.  Past Medical History  Diagnosis Date  . GERD (gastroesophageal reflux disease)   . Hypercholesteremia   . Osteoporosis   . Thyroid disease   . Rib fractures   . Fracture lumbar vertebra-closed   . Fracture of thoracic vertebra, closed   . Cancer 10/12    bx/right breast  . History of blood clots   . Cataract 3 and 10/92    bilateral  . Hypertension     DR Terrilee Files  . Aneurysm   . Osteoporosis   . 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 DVT (deep venous thrombosis) 08/2011    LL extremity   . History of radiation therapy 01/2012    R breast    Past Surgical History  Procedure Laterality Date  . Hernia repair  10/16/2006    [facility] - Dr Daphine Deutscher  . Appendectomy  1940  . Tonsillectomy  1944  . Cataract extraction  1992  . Eye surgery  1940, 1956  . Mastectomy, partial  10/17/2011    Procedure: MASTECTOMY PARTIAL;  Surgeon: Currie Paris, MD;  Location: [facility] OR;  Service: General;  Laterality: Right;  needle guided  . Breast surgery      Family History   Problem Relation Age of Onset  . Heart disease Mother   . Heart disease Maternal Uncle   . Heart disease Maternal Grandfather     History  Substance Use Topics  . Smoking status: Never Smoker   . Smokeless tobacco: Never Used  . Alcohol Use: No    OB History   Grav Para Term Preterm Abortions TAB SAB Ect Mult Living                  Review of Systems  Constitutional:       Per HPI, otherwise negative  HENT:       Per HPI, otherwise negative  Respiratory:       Per HPI, otherwise negative  Cardiovascular:       Per HPI, otherwise negative  Gastrointestinal: Negative for vomiting.  Endocrine:       Negative aside from HPI  Genitourinary:       Neg aside from HPI   Musculoskeletal:       Per HPI, otherwise negative  Skin: Positive for color change.  Neurological: Negative for syncope.    Allergies  Contrast media; Iohexol; and Penicillins  Home Medications   Current Outpatient Rx  Name  Route  Sig  Dispense  Refill  . amitriptyline (ELAVIL) 50 MG tablet   Oral  Take 50 mg by mouth at bedtime.          Marland Kitchen amLODipine (NORVASC) 10 MG tablet   Oral   Take 10 mg by mouth daily.          Marland Kitchen aspirin 81 MG tablet   Oral   Take 81 mg by mouth daily.         Marland Kitchen BONIVA 150 MG tablet   Oral   Take 150 mg by mouth every 30 (thirty) days.          . calcium citrate-vitamin D (CITRACAL+D) 315-200 MG-UNIT per tablet   Oral   Take 1 tablet by mouth 2 (two) times daily.          . cholecalciferol (VITAMIN D) 1000 UNITS tablet   Oral   Take 1,000 Units by mouth daily. Vitamin D3         . diphenhydrAMINE (BENADRYL) 25 MG tablet   Oral   Take 2 tablets (50 mg total) by mouth once. Take Benadryl 50 mgm @ 9:30 am 12/04/12   30 tablet   0   . ENABLEX 15 MG 24 hr tablet   Oral   Take 15 mg by mouth daily.          Marland Kitchen HYDROcodone-acetaminophen (VICODIN) 5-500 MG per tablet   Oral   Take 1 tablet by mouth every 8 (eight) hours as needed. For pain          . metoprolol succinate (TOPROL-XL) 25 MG 24 hr tablet   Oral   Take 1 tablet (25 mg total) by mouth daily.   30 tablet   11   . Multiple Vitamin (MULTIVITAMIN) tablet   Oral   Take 1 tablet by mouth daily.          . pravastatin (PRAVACHOL) 20 MG tablet   Oral   Take 20 mg by mouth every evening.          Marland Kitchen PRILOSEC OTC 20 MG tablet   Oral   Take 20 mg by mouth daily.          Marland Kitchen SYNTHROID 100 MCG tablet   Oral   Take 100 mcg by mouth daily.            BP 129/90  Pulse 98  Temp(Src) 98.1 F (36.7 C) (Oral)  Resp 20  SpO2 99%  Physical Exam  Nursing note and vitals reviewed. Constitutional: She is oriented to person, place, and time. She appears well-developed and well-nourished. No distress.  HENT:  Head: Normocephalic and atraumatic.  Eyes: Conjunctivae and EOM are normal.  Cardiovascular: Regular rhythm.  Tachycardia present.   Pulmonary/Chest: Effort normal and breath sounds normal. No stridor. No respiratory distress.  Abdominal: She exhibits no distension.  Musculoskeletal: She exhibits no edema.  Of for sure is erythematous, slightly greater on the left.  There is chronic discoloration consistent with stasis, slight weeping on the left.  Knees, ankles are appropriate with strong, with appropriate range of motion.  Neurological: She is alert and oriented to person, place, and time. No cranial nerve deficit.  Skin: Skin is warm and dry.  Psychiatric: She has a normal mood and affect.    ED Course  Procedures (including critical care time)  Labs Reviewed  BASIC METABOLIC PANEL - Abnormal; Notable for the following:    Glucose, Bld 119 (*)    GFR calc non Af Amer 80 (*)    All other components within normal limits  D-DIMER, QUANTITATIVE -  Abnormal; Notable for the following:    D-Dimer, Quant 1.77 (*)    All other components within normal limits  CBC  PROTIME-INR  POCT I-STAT TROPONIN I   No results found.   No diagnosis found. Cardiac  110 sinus tach abnormal Pulse ox 94% nasal cannula abnormal    Date: 01/21/2013  Rate: 99  Rhythm: normal sinus rhythm  QRS Axis: left  Intervals: normal  ST/T Wave abnormalities: nonspecific T wave changes  Conduction Disutrbances:none  Narrative Interpretation:   Old EKG Reviewed: none available ABNORMAL  Update: Patient remains calm appearing.  She remains tachypneic, tachycardic  MDM  Following initial evaluation, it became clear the patient has a dye allergy.  Previously she has required preparation for her CT scans to evaluate her thoracic aortic aneurysm.  Given the concern for PE and/or DVT, I discussed the patient's case with our radiologist.  Given the concern for low sensitivity of a VQ scan, and this patient with prior history of thromboembolic disease he recommended preparation for full CT angiography, and duplex ultrasound when possible.  The patient was treated empirically with heparin pending these studies.        Gerhard Munch, MD 01/21/13 [phone]

## 2013-01-21 NOTE — H&P (Signed)
Hospital Admission Note Date: 01/21/2013  Patient name: [name] Medical record number: [id number] Date of birth: [birth year] Age: 77 y.o. Gender: female PCP: Lillia Mountain, MD  Attending physician: Christiane Ha, MD  Chief Complaint: Leg swelling and abnormal labs.  History of Present Illness:  [name] is an 77 y.o. female who was sent to the emergency room by her primary care provider. She has had several days worth of bilateral lower extremity edema and pain. This started about 5 days ago. She has had 2 previous DVTs. She has a history of breast cancer. She also has had dyspnea on exertion which has been relatively mild over the past several weeks. She has had no chest pain. No palpitations. No fevers or chills. She has been unable to get up and about much over the past several weeks due to severe low back pain. She has a history of multiple compression fractures and has had x-rays by Dr. Venetia Maxon which showed no new fractures. CAT scan is scheduled for later this month same. She recently had her Synthroid dose increased. She has no previous history of heart failure. ED physician started patient on a heparin drip. D-dimer is elevated, and a CT of the chest initially was planned, but patient has IV contrast allergy. Therefore, medicine has been asked to admit patient for observation so that CT angiogram of the chest with pretreatment, or VQ scan could be done. Dopplers of the legs have also been ordered by EDP.  Past Medical History  Diagnosis Date  . GERD (gastroesophageal reflux disease)   . Hypercholesteremia   . Osteoporosis   . Thyroid disease   . Rib fractures   . Fracture lumbar vertebra-closed   . Fracture of thoracic vertebra, closed   . History of blood clots   . Cataract 3 and 10/92    bilateral  . Hypertension     DR Terrilee Files  . Aneurysm   . Osteoporosis   . DVT (deep venous thrombosis) 08/2011    LL extremity   . History of radiation therapy  01/2012    R breast  . Hypothyroidism   . Anxiety   . Shortness of breath   . H/O hiatal hernia   . Cancer 10/12    bx/right breast  . 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 Fibromyalgia     Meds: Prescriptions prior to admission  Medication Sig Dispense Refill  . amLODipine (NORVASC) 10 MG tablet Take 10 mg by mouth daily.       . calcium citrate-vitamin D (CITRACAL+D) 315-200 MG-UNIT per tablet Take 2 tablets by mouth daily.       . cholecalciferol (VITAMIN D) 1000 UNITS tablet Take 1,000 Units by mouth daily. Vitamin D3      . ENABLEX 15 MG 24 hr tablet Take 15 mg by mouth daily.       Marland Kitchen HYDROcodone-acetaminophen (VICODIN) 5-500 MG per tablet Take 1 tablet by mouth every 8 (eight) hours as needed. For pain      . levothyroxine (SYNTHROID, LEVOTHROID) 112 MCG tablet Take 112 mcg by mouth daily.      . Multiple Vitamin (MULTIVITAMIN) tablet Take 1 tablet by mouth daily.       . pravastatin (PRAVACHOL) 20 MG tablet Take 20 mg by mouth every evening.       Marland Kitchen PRILOSEC OTC 20 MG tablet Take 20 mg by mouth daily.       Marland Kitchen amitriptyline (  ELAVIL) 50 MG tablet Take 25-50 mg by mouth at bedtime.       Marland Kitchen aspirin 81 MG tablet Take 81 mg by mouth daily.      Marland Kitchen BONIVA 150 MG tablet Take 150 mg by mouth every 30 (thirty) days.       . metoprolol succinate (TOPROL-XL) 25 MG 24 hr tablet Take 1 tablet (25 mg total) by mouth daily.  30 tablet  11    Allergies: Contrast media; Iohexol; and Penicillins History   Social History  . Marital Status: Married    Spouse Name: N/A    Number of Children: N/A  . Years of Education: N/A   Occupational History  . Not on file.   Social History Main Topics  . Smoking status: Never Smoker   . Smokeless tobacco: Never Used  . Alcohol Use: No  . Drug Use: No  . Sexually Active: No   Other Topics Concern  . Not on file   Social History Narrative  . No narrative on file   Family History  Problem Relation Age of Onset  . Heart  disease Mother   . Heart disease Maternal Uncle   . Heart disease Maternal Grandfather    Past Surgical History  Procedure Laterality Date  . Hernia repair  10/16/2006    [facility] - Dr Daphine Deutscher  . Appendectomy  1940  . Tonsillectomy  1944  . Cataract extraction  1992  . Eye surgery  1940, 1956  . Mastectomy, partial  10/17/2011    Procedure: MASTECTOMY PARTIAL;  Surgeon: Currie Paris, MD;  Location[facility] OR;  Service: General;  Laterality: Right;  needle guided  . Breast surgery      Review of Systems: Systems reviewed and as per HPI, otherwise negative.  Physical Exam: Blood pressure 147/65, pulse 102, temperature 98.7 F (37.1 C), temperature source Oral, resp. rate 18, height 5' (1.524 m), weight 61.9 kg (136 lb 7.4 oz), SpO2 98.00%. BP 147/65  Pulse 102  Temp(Src) 98.7 F (37.1 C) (Oral)  Resp 18  Ht 5' (1.524 m)  Wt 61.9 kg (136 lb 7.4 oz)  BMI 26.65 kg/m2  SpO2 98%  General Appearance:    Alert, cooperative, no distress at rest. Winces when trying to reposition in bed., appears stated age  Head:    Normocephalic, without obvious abnormality, atraumatic  Eyes:    PERRL, conjunctiva/corneas clear, EOM's intact, fundi    benign, both eyes     Nose:   Nares normal, septum midline, mucosa normal, no drainage    or sinus tenderness  Throat:   Lips, mucosa, and tongue normal; teeth and gums normal  Neck:   Supple, symmetrical, trachea midline, no adenopathy;    thyroid:  no enlargement/tenderness/nodules; no carotid   bruit or JVD     Lungs:     Clear to auscultation bilaterally, respirations unlabored      Heart:    Regular rate and rhythm with premature beats. No murmurs gallops rubs appreciated        Genitalia:   deferred  Rectal:   deferred  Extremities:   3+ pitting edema of both legs and feet. Hyperpigmentation of the distal left lower extremity which is chronic. Also some ecchymoses near the ankle of the right leg.   Pulses:   2+ and symmetric all extremities   Skin:   Skin color, texture, turgor normal, no rashes or lesions  Lymph nodes:   Cervical, supraclavicular, and axillary nodes normal  Neurologic:  CNII-XII intact, normal strength, sensation and reflexes    throughout    Psychiatric: Normal affect  Lab results: Basic Metabolic Panel:  Recent Labs  16/10/96 2225  NA 136  K 3.7  CL 97  CO2 28  GLUCOSE 119*  BUN 21  CREATININE 0.79  CALCIUM 10.1   Liver Function Tests: No results found for this basename: AST, ALT, ALKPHOS, BILITOT, PROT, ALBUMIN,  in the last 72 hours No results found for this basename: LIPASE, AMYLASE,  in the last 72 hours No results found for this basename: AMMONIA,  in the last 72 hours CBC:  Recent Labs  01/20/13 2225  WBC 10.0  HGB 12.8  HCT 36.2  MCV 81.0  PLT 244   D-Dimer:  Recent Labs  01/20/13 2225  DDIMER 1.77*  Coagulation:  Recent Labs  01/20/13 2225  LABPROT 13.3  INR 1.02   EKG shows normal sinus rhythm with PACs.  Imaging results:  Dg Chest 2 View  01/21/2013  *RADIOLOGY REPORT*  Clinical Data: Shortness of breath  CHEST - 2 VIEW  Comparison: 12/04/2012 CT  Findings: Moderate to large hiatal hernia.  Heart size upper normal to mildly enlarged.  Aortic atherosclerosis and tortuosity. Ascending dilatation.  Mild hyperaeration.  Apical scarring. Senescent changes.  No confluent airspace opacity, pleural effusion, or pneumothorax.  Curvature of the thoracic spine, exaggerated kyphosis, and multilevel compression fractures. Diffuse osteopenia.  IMPRESSION: No focal consolidation.  Moderate to large hiatal hernia.  Multilevel compression fractures.  Heart size upper normal to mildly enlarged.  Aortic dilatation and tortuosity.   Original Report Authenticated By: Jearld Lesch, M.D.     Assessment & Plan: Active Problems:   Dyspnea   Bilateral leg edema   History of DVT of lower extremity   Essential hypertension, benign   Breast Cancer, DCIS, Right, Receptor +   Back  pain, lumbosacral   Osteoporosis, unspecified   Unspecified hypothyroidism  As patient has IV contrast allergy, patient was unable to get a CT angiogram of the chest in the middle of the night or VQ scan. Will place on observation. Telemetry. Order Dopplers, VQ scan. TSH, pro BNP. Resume outpatient medications. Continue heparin drip for now.   SULLIVAN,CORINNA L 01/21/2013, 6:47 AM

## 2013-01-21 NOTE — Progress Notes (Signed)
Subjective: She has bee having severe low back pain for at least 3 weeks, radiating to r groin, not improving, very diffucult mobility.  Constipation from narcotics on 2 laxatives.  Last few days severe edema L>R legs, h/o DVT in context of compression fracture in past, D-Dimer high.  No pulmonary symptoms  Objective: Vital signs in last 24 hours: Temp:  [98 F (36.7 C)-98.7 F (37.1 C)] 98.7 F (37.1 C) (03/12 0526) Pulse Rate:  [98-102] 102 (03/12 0526) Resp:  [18-24] 18 (03/12 0526) BP: (123-147)/(54-90) 147/65 mmHg (03/12 0526) SpO2:  [98 %-100 %] 98 % (03/12 0526) Weight:  [58.5 kg (128 lb 15.5 oz)-61.9 kg (136 lb 7.4 oz)] 61.9 kg (136 lb 7.4 oz) (03/12 0526) Weight change:  Last BM Date: 01/18/13  Intake/Output from previous day: 03/11 0701 - 03/12 0700 In: -  Out: 575 [Urine:575] Intake/Output this shift: Total I/O In: -  Out: 300 [Urine:300]  Resp: clear to auscultation bilaterally Cardio: regular rate and rhythm, S1, S2 normal, no murmur, click, rub or gallop Extremities: edema Bilateral 2+  Lab Results:  Recent Labs  01/20/13 2225  WBC 10.0  HGB 12.8  HCT 36.2  PLT 244   BMET  Recent Labs  01/20/13 2225  NA 136  K 3.7  CL 97  CO2 28  GLUCOSE 119*  BUN 21  CREATININE 0.79  CALCIUM 10.1    Studies/Results: Dg Chest 2 View  01/21/2013  *RADIOLOGY REPORT*  Clinical Data: Shortness of breath  CHEST - 2 VIEW  Comparison: 12/04/2012 CT  Findings: Moderate to large hiatal hernia.  Heart size upper normal to mildly enlarged.  Aortic atherosclerosis and tortuosity. Ascending dilatation.  Mild hyperaeration.  Apical scarring. Senescent changes.  No confluent airspace opacity, pleural effusion, or pneumothorax.  Curvature of the thoracic spine, exaggerated kyphosis, and multilevel compression fractures. Diffuse osteopenia.  IMPRESSION: No focal consolidation.  Moderate to large hiatal hernia.  Multilevel compression fractures.  Heart size upper normal to mildly  enlarged.  Aortic dilatation and tortuosity.   Original Report Authenticated By: Jearld Lesch, M.D.     Medications: I have reviewed the patient's current medications.  Assessment/Plan: Principal Problem:   Bilateral leg edema U/S to R/O DVT, on IV heparin Active Problems:   Breast Cancer, DCIS, Right, Receptor +   History of DVT of lower extremity   Back pain, lumbosacral compression fracture.  Talk with Dr. Venetia Maxon regarding MRI today and if candidate for intervention.   Osteoporosis, unspecified   Essential hypertension, benign   Unspecified hypothyroidism Constipation, narcotic related.  On senekot, Add miralax, has been severe    LOS: 1 day   GRIFFIN,JOHN JOSEPH 01/21/2013, 7:44 AM

## 2013-01-21 NOTE — Progress Notes (Signed)
ANTICOAGULATION CONSULT NOTE - Follow Up Consult  Pharmacy Consult for Heparin Indication: rule out pulmonary embolus   Allergies  Allergen Reactions  . Contrast Media (Iodinated Diagnostic Agents) Shortness Of Breath  . Iohexol Shortness Of Breath  . Penicillins Rash    Patient Measurements: Height: 5' (152.4 cm) Weight: 136 lb 7.4 oz (61.9 kg) IBW/kg (Calculated) : 45.5 Heparin Dosing Weight: 58 kg  Vital Signs: Temp: 98.7 F (37.1 C) (03/12 0526) Temp src: Oral (03/12 0526) BP: 130/72 mmHg (03/12 1042) Pulse Rate: 110 (03/12 1042)  Labs:  Recent Labs  01/20/13 2225 01/21/13 1048  HGB 12.8  --   HCT 36.2  --   PLT 244  --   LABPROT 13.3  --   INR 1.02  --   HEPARINUNFRC  --  0.37  CREATININE 0.79  --     Estimated Creatinine Clearance: 50.7 ml/min (by C-G formula based on Cr of 0.79).   Medications:  Scheduled:  . amitriptyline  50 mg Oral QHS  . amLODipine  10 mg Oral Daily  . cholecalciferol  1,000 Units Oral Daily  . darifenacin  15 mg Oral Daily  . docusate sodium  100 mg Oral BID  . [COMPLETED] heparin  3,000 Units Intravenous Once  . levothyroxine  112 mcg Oral QAC breakfast  . metoprolol succinate  25 mg Oral Daily  . pantoprazole  40 mg Oral Daily  . polyethylene glycol  17 g Oral BID  . senna  1 tablet Oral BID  . simvastatin  10 mg Oral q1800  . sodium chloride  3 mL Intravenous Q12H  . [DISCONTINUED] sodium chloride   Intravenous STAT  . [DISCONTINUED] omeprazole  20 mg Oral Daily   Infusions:  . heparin 950 Units/hr (01/21/13 0315)    Assessment: 77 yo F started on heparin for r/o PE/DVT.  Pt was seen in ED with severe back pain and LE edema.  Dopplers have been completed and are negative.  Unsure of plan for VQ scan or CTA to r/o PE (noted dye allergy).  Heparin is therapeutic on 950 units/hr.  Goal of Therapy:  Heparin level 0.3-0.7 units/ml Monitor platelets by anticoagulation protocol: Yes   Plan:  Continue heparin at 950  units/hr. Heparin level at 1800 to confirm. Heparin level and CBC daily.  [employer], Pharm.D., BCPS Clinical Pharmacist Pager [phone] 01/21/2013 12:48 PM

## 2013-01-22 ENCOUNTER — Inpatient Hospital Stay (HOSPITAL_COMMUNITY): Payer: Medicare Other

## 2013-01-22 DIAGNOSIS — M8448XA Pathological fracture, other site, initial encounter for fracture: Secondary | ICD-10-CM | POA: Diagnosis not present

## 2013-01-22 DIAGNOSIS — M810 Age-related osteoporosis without current pathological fracture: Secondary | ICD-10-CM | POA: Diagnosis not present

## 2013-01-22 DIAGNOSIS — M545 Low back pain: Secondary | ICD-10-CM | POA: Diagnosis not present

## 2013-01-22 DIAGNOSIS — M546 Pain in thoracic spine: Secondary | ICD-10-CM | POA: Diagnosis not present

## 2013-01-22 LAB — CBC
HCT: 33.6 % — ABNORMAL LOW (ref 36.0–46.0)
MCHC: 35.4 g/dL (ref 30.0–36.0)
Platelets: 229 10*3/uL (ref 150–400)
RDW: 13.5 % (ref 11.5–15.5)
WBC: 8.2 10*3/uL (ref 4.0–10.5)

## 2013-01-22 LAB — HEPARIN LEVEL (UNFRACTIONATED): Heparin Unfractionated: 0.16 IU/mL — ABNORMAL LOW (ref 0.30–0.70)

## 2013-01-22 MED ORDER — POTASSIUM CHLORIDE CRYS ER 10 MEQ PO TBCR
10.0000 meq | EXTENDED_RELEASE_TABLET | Freq: Every day | ORAL | Status: DC
  Administered 2013-01-22 – 2013-01-28 (×7): 10 meq via ORAL
  Filled 2013-01-22 (×8): qty 1

## 2013-01-22 MED ORDER — FUROSEMIDE 20 MG PO TABS
20.0000 mg | ORAL_TABLET | Freq: Every day | ORAL | Status: DC
  Administered 2013-01-22 – 2013-01-27 (×6): 20 mg via ORAL
  Filled 2013-01-22 (×7): qty 1

## 2013-01-22 NOTE — Progress Notes (Signed)
Subjective: Unable to interview, in MRI.  Good night per nurse  Objective: Vital signs in last 24 hours: Temp:  [98.1 F (36.7 C)-98.7 F (37.1 C)] 98.2 F (36.8 C) (03/13 0436) Pulse Rate:  [84-110] 84 (03/13 0436) Resp:  [18] 18 (03/13 0436) BP: (103-130)/(66-72) 129/68 mmHg (03/13 0436) SpO2:  [96 %-100 %] 97 % (03/13 0436) Weight:  [60.6 kg (133 lb 9.6 oz)] 60.6 kg (133 lb 9.6 oz) (03/13 0436) Weight change: 2.1 kg (4 lb 10.1 oz) Last BM Date: 01/21/13  Intake/Output from previous day: 03/12 0701 - 03/13 0700 In: 720 [P.O.:720] Out: 2025 [Urine:2025] Intake/Output this shift:    General appearance: alert  Lab Results:  Recent Labs  01/20/13 2225  WBC 10.0  HGB 12.8  HCT 36.2  PLT 244   BMET  Recent Labs  01/20/13 2225  NA 136  K 3.7  CL 97  CO2 28  GLUCOSE 119*  BUN 21  CREATININE 0.79  CALCIUM 10.1    Studies/Results: Dg Chest 2 View  01/21/2013  *RADIOLOGY REPORT*  Clinical Data: Shortness of breath  CHEST - 2 VIEW  Comparison: 12/04/2012 CT  Findings: Moderate to large hiatal hernia.  Heart size upper normal to mildly enlarged.  Aortic atherosclerosis and tortuosity. Ascending dilatation.  Mild hyperaeration.  Apical scarring. Senescent changes.  No confluent airspace opacity, pleural effusion, or pneumothorax.  Curvature of the thoracic spine, exaggerated kyphosis, and multilevel compression fractures. Diffuse osteopenia.  IMPRESSION: No focal consolidation.  Moderate to large hiatal hernia.  Multilevel compression fractures.  Heart size upper normal to mildly enlarged.  Aortic dilatation and tortuosity.   Original Report Authenticated By: Jearld Lesch, M.D.     Medications: I have reviewed the patient's current medications.  Assessment/Plan: Principal Problem:  Bilateral leg edema U/S negative for DVT, heparin IV discontinued, on prophylactic lovenox. Low dose of lasix Active Problems:  Back pain, lumbosacral compression fracture. MRI today,  Dr. Venetia Maxon to see.  Will ask PT to see Osteoporosis, unspecified  Essential hypertension, benign controlled   Constipation, narcotic related. On senekot and miralax, has been severe    LOS: 2 days   [name],[name] 01/22/2013, 7:14 AM

## 2013-01-22 NOTE — Progress Notes (Signed)
Subjective: Patient reports "All my pain is in my back, nothing hurts in my legs"  Objective: Vital signs in last 24 hours: Temp:  [98.1 F (36.7 C)-98.7 F (37.1 C)] 98.2 F (36.8 C) (03/13 0436) Pulse Rate:  [77-110] 77 (03/13 0959) Resp:  [18] 18 (03/13 0436) BP: (103-130)/(62-72) 105/62 mmHg (03/13 1000) SpO2:  [96 %-100 %] 97 % (03/13 0436) Weight:  [60.6 kg (133 lb 9.6 oz)] 60.6 kg (133 lb 9.6 oz) (03/13 0436)  Intake/Output from previous day: 03/12 0701 - 03/13 0700 In: 720 [P.O.:720] Out: 2025 [Urine:2025] Intake/Output this shift: Total I/O In: 240 [P.O.:240] Out: -   Alert, conversant. Reports persistent back pain.  No "distress" at present with HOB at 25 degrees; however, pt notes "excruciating" pain with HOB at 45 degrees or higher.  MRI reviewed by Dr. Venetia Maxon reveals new compression fractures at T11, T12, & L1.   Lab Results:  Recent Labs  01/20/13 2225 01/22/13 0603  WBC 10.0 8.2  HGB 12.8 11.9*  HCT 36.2 33.6*  PLT 244 229   BMET  Recent Labs  01/20/13 2225  NA 136  K 3.7  CL 97  CO2 28  GLUCOSE 119*  BUN 21  CREATININE 0.79  CALCIUM 10.1    Studies/Results: Dg Chest 2 View  01/21/2013  *RADIOLOGY REPORT*  Clinical Data: Shortness of breath  CHEST - 2 VIEW  Comparison: 12/04/2012 CT  Findings: Moderate to large hiatal hernia.  Heart size upper normal to mildly enlarged.  Aortic atherosclerosis and tortuosity. Ascending dilatation.  Mild hyperaeration.  Apical scarring. Senescent changes.  No confluent airspace opacity, pleural effusion, or pneumothorax.  Curvature of the thoracic spine, exaggerated kyphosis, and multilevel compression fractures. Diffuse osteopenia.  IMPRESSION: No focal consolidation.  Moderate to large hiatal hernia.  Multilevel compression fractures.  Heart size upper normal to mildly enlarged.  Aortic dilatation and tortuosity.   Original Report Authenticated By: Jearld Lesch, M.D.    Mr Thoracic Spine Wo  Contrast  01/22/2013  *RADIOLOGY REPORT*  Clinical Data: Acute low back pain and thoracic spine pain.  MRI THORACIC SPINE WITHOUT CONTRAST  Technique:  Multiplanar and multiecho pulse sequences of the thoracic spine were obtained without intravenous contrast.  Comparison: Radiographs dated 01/20/2013 and CT scan dated 12/04/2012  Findings: There is an acute or subacute compression fracture of the superior endplate of L1.  There is no protrusion of disc material into the spinal canal.  There is minimal protrusion of bone of the posterior-superior aspect of the L1 vertebral body into the spinal canal with no neural impingement.  Tip of the conus is at L2 and appears normal.  There are old stable compression fractures of the T9 and T11.  The thoracic spinal cord is normal.  There is no myelopathy or mass lesion or spinal cord compression.  Paraspinal soft tissues are normal except for edema around the fracture at L1.  IMPRESSION:  1. Acute slight compression fracture of the superior endplate of L1.  No neural impingement.  This has the appearance typical for a benign osteoporotic compression fracture. 2.  Old compression fractures of T9 and T11.   Original Report Authenticated By: Francene Boyers, M.D.    Mr Lumbar Spine Wo Contrast  01/22/2013  *RADIOLOGY REPORT*  Clinical Data: Low back pain and right groin pain.  MRI LUMBAR SPINE WITHOUT CONTRAST  Technique:  Multiplanar and multiecho pulse sequences of the lumbar spine were obtained without intravenous contrast.  Comparison: None.  Findings: There is  a benign appearing slight compression fracture of the superior aspect of L1.  There is an adjacent Schmorl's node in the superior endplate of L1.  There is slight protrusion of the posterior aspect of the L1 vertebral body into the spinal canal with no neural impingement. Slight edema in the adjacent paraspinal soft tissues.  There is an old compression fracture of T11.  Tip of the conus is at L2 and appears normal.   L1-2: The disc is normal.  L2-3:  Large Schmorl's node in the superior endplate of L3. Minimal bulging of the disc into the neural foramina without neural impingement.  L3-4:  Small broad-based disc bulge with no neural impingement.  L4-5 and L5-L1:  Normal.  IMPRESSION:  1.  Benign-appearing acute slight compression fracture of the superior  aspect of L1.  No neural impingement. 2.  Old compression fracture of T11. 3.  No other significant abnormalities.   Original Report Authenticated By: Francene Boyers, M.D.     Assessment/Plan:   LOS: 2 days  Discussed with pt MRI results & plan for Kyphoplasties at T11, T12, & L1 when cleared medically.  Kyphoplasty may be done on outpatient basis. Will d/w Dr. Venetia Maxon pain control measures while awaiting Kypho.   Georgiann Cocker 01/22/2013, 10:12 AM

## 2013-01-22 NOTE — Evaluation (Signed)
Physical Therapy Evaluation Patient Details Name: [name] MRN: [id number] DOB: 1935-12-04 Today's Date: 01/22/2013 Time: 4098-1191 PT Time Calculation (min): 38 min  PT Assessment / Plan / Recommendation Clinical Impression  Patient is a 77 y/o female admitted with bilateral LE swelling with h/o DVT and breast CA, h/o lumbar and thoracic vertebral fractures and recent increase in back pain with difficulty managing at home and was scheduled for MRI outpatient, had MRI today positive for acute T11, T12 and L1 compression fractures.  She plans to undergo kyphoplasty, but may be delay in schedling due to MD availablility.  She presents with severely limited mobility due to acute pain and needs max assist for bed mobility and increased time for ambulation with high fall risk.  Spouse is unable to care for patient at home and recommend either extend acute stay until able to schedule surgery versus transition to STSNF till able to schedule surgery.  Will follow in acute setting.    PT Assessment  Patient needs continued PT services    Follow Up Recommendations  SNF;Supervision/Assistance - 24 hour    Does the patient have the potential to tolerate intense rehabilitation    No  Barriers to Discharge Spouse with chronic health issues and unable to provide enough physical assist to help pt.    Equipment Recommendations  None recommended by PT    Recommendations for Other Services   None  Frequency Min 3X/week    Precautions / Restrictions Precautions Precautions: Fall   Pertinent Vitals/Pain "15/10" with mobility (had medication prior to mobility)      Mobility  Bed Mobility Bed Mobility: Rolling Left;Left Sidelying to Sit;Sit to Sidelying Left Rolling Left: 5: Supervision;With rail Left Sidelying to Sit: 2: Max assist;With rails;HOB flat Sit to Sidelying Left: 2: Max assist;HOB flat;With rail Details for Bed Mobility Assistance: severe pain with transitional movements esp in  bed; need increased time and encouragement and cues for spinal precautions Transfers Transfers: Sit to Stand;Stand to Sit Sit to Stand: 4: Min assist;From bed Stand to Sit: 4: Min assist;To bed Details for Transfer Assistance: Hands on walker with assist to stabilize to keep spine upright and due to UE reliance with pain in sitting and standing Ambulation/Gait Ambulation/Gait Assistance: 4: Min assist Ambulation Distance (Feet): 5 Feet Assistive device: Rolling walker Ambulation/Gait Assistance Details: slow, pain grabbing each step, severely short step length with narrow base of support and UE reliance so needs help to progress walker with turns.  about 2.5' forward and 2.5' back Gait Pattern: Step-to pattern;Decreased stride length;Narrow base of support;Decreased hip/knee flexion - left;Decreased hip/knee flexion - right;Shuffle;Antalgic        PT Diagnosis: Difficulty walking;Acute pain  PT Problem List: Decreased strength;Pain;Decreased mobility;Decreased activity tolerance;Decreased knowledge of precautions PT Treatment Interventions: DME instruction;Gait training;Functional mobility training;Therapeutic activities;Therapeutic exercise;Patient/family education   PT Goals Acute Rehab PT Goals PT Goal Formulation: With patient Time For Goal Achievement: 02/05/13 Potential to Achieve Goals: Good Pt will Roll Supine to Right Side: with modified independence;with rail PT Goal: Rolling Supine to Right Side - Progress: Goal set today Pt will Roll Supine to Left Side: with modified independence;with rail PT Goal: Rolling Supine to Left Side - Progress: Goal set today Pt will go Supine/Side to Sit: with supervision;with rail PT Goal: Supine/Side to Sit - Progress: Goal set today Pt will go Sit to Supine/Side: with supervision;with rail PT Goal: Sit to Supine/Side - Progress: Goal set today Pt will go Sit to Stand: with supervision;with upper extremity  assist PT Goal: Sit to Stand -  Progress: Goal set today Pt will go Stand to Sit: with supervision;with upper extremity assist PT Goal: Stand to Sit - Progress: Goal set today Pt will Ambulate: 51 - 150 feet;with supervision;with rolling walker PT Goal: Ambulate - Progress: Goal set today  Visit Information  Last PT Received On: 01/22/13 Assistance Needed: +2    Subjective Data  Subjective: I can't do any physical therapy.  I have too much pain. Patient Stated Goal: To have surgery to get pain under control   Prior Functioning  Home Living Lives With: Spouse Type of Home: House Home Access: Stairs to enter [employer] of Steps: 2 Entrance Stairs-Rails: None Home Layout: One level Bathroom Shower/Tub: Walk-in shower Home Adaptive Equipment: Walker - rolling;Straight cane;Shower chair without back Additional Comments: no elevated toilet, reports difficulty getting up and down since back pain started; normally independent until back pain then used cane, RW, and needed assist wtih mobility, self care, etc Prior Function Level of Independence: Independent Driving: Yes Communication Communication: No difficulties    Cognition  Cognition Overall Cognitive Status: Appears within functional limits for tasks assessed/performed Arousal/Alertness: Awake/alert Orientation Level: Appears intact for tasks assessed Behavior During Session: Anxious Cognition - Other Comments: tearful with pain during mobiilty. Expresses concerns with spouse being about to take care of her with his chronic medical conditions he's not taking as good care of himself trying to help her    Extremity/Trunk Assessment Right Upper Extremity Assessment RUE ROM/Strength/Tone: [facility] for tasks assessed Left Upper Extremity Assessment LUE ROM/Strength/Tone: [facility] for tasks assessed Right Lower Extremity Assessment RLE ROM/Strength/Tone: Deficits;Due to pain RLE ROM/Strength/Tone Deficits: AAROM hip/knee flexion about 60-70 degrees with  grabbing pain in back; knee extension strength at least 3-/5, ankle AROM WFL, other strength testing deferred due to pain; edema and errythema bilateral lower legs left > right Left Lower Extremity Assessment LLE ROM/Strength/Tone: Deficits;Due to pain LLE ROM/Strength/Tone Deficits: AAROM hip/knee flexion about 60-70 degrees with grabbing pain in back; knee extension strength at least 3-/5, ankle AROM WFL, other strength testing deferred due to pain; edema and errythema bilateral lower legs left > right Trunk Assessment Trunk Assessment: Kyphotic;Other exceptions Trunk Exceptions: kyphoscoliosis c-curve left convexity   Balance Balance Balance Assessed: Yes Static Sitting Balance Static Sitting - Balance Support: Bilateral upper extremity supported;Feet supported Static Sitting - Level of Assistance: 5: Stand by assistance Static Sitting - Comment/# of Minutes: heavy UE reliance  End of Session PT - End of Session Equipment Utilized During Treatment: Gait belt Activity Tolerance: Patient limited by pain Patient left: in bed;with call bell/phone within reach  GP     [facility] 01/22/2013, 11:36 AM Sheran Lawless, PT [phone] 01/22/2013

## 2013-01-22 NOTE — Progress Notes (Signed)
Patient can proceed with kyphoplasty as outpatient if pain sufficiently controlled either on my return (I am going out of town next week) or by one of my partners in my absence if she feels she cannot wait.

## 2013-01-23 DIAGNOSIS — M8448XA Pathological fracture, other site, initial encounter for fracture: Secondary | ICD-10-CM | POA: Diagnosis not present

## 2013-01-23 DIAGNOSIS — M810 Age-related osteoporosis without current pathological fracture: Secondary | ICD-10-CM | POA: Diagnosis not present

## 2013-01-23 LAB — BASIC METABOLIC PANEL
Chloride: 101 mEq/L (ref 96–112)
GFR calc Af Amer: >90 mL/min (ref >90)
Potassium: 3.7 mEq/L (ref 3.5–5.1)

## 2013-01-23 MED ORDER — CALCITONIN (SALMON) 200 UNIT/ACT NA SOLN
1.0000 | Freq: Every day | NASAL | Status: DC
  Administered 2013-01-23 – 2013-01-27 (×5): 1 via NASAL
  Filled 2013-01-23 (×2): qty 3.7

## 2013-01-23 NOTE — Progress Notes (Signed)
Patient ID: [name], female   DOB: 12-17-1935, 77 y.o.   MRN: [id number] Reviewed films with radiologist and Dr. Venetia Maxon. Patient has acute on chronic T11 fracture and Acute L1 fracture. Plan acrylic kyphoplasty at both for Monday Am.

## 2013-01-23 NOTE — Progress Notes (Signed)
Subjective: Still with Back pain   Objective: Vital signs in last 24 hours: Temp:  [98.1 F (36.7 C)-98.6 F (37 C)] 98.4 F (36.9 C) (03/14 0519) Pulse Rate:  [73-93] 73 (03/14 0519) Resp:  [18-19] 19 (03/14 0519) BP: (105-119)/(60-65) 106/62 mmHg (03/14 0519) SpO2:  [95 %-98 %] 95 % (03/14 0519) Weight:  [58.514 kg (129 lb)] 58.514 kg (129 lb) (03/14 0519) Weight change: -2.086 kg (-4 lb 9.6 oz) Last BM Date: 01/21/13  Intake/Output from previous day: 03/13 0701 - 03/14 0700 In: 720 [P.O.:720] Out: 1250 [Urine:1250] Intake/Output this shift: Total I/O In: -  Out: 550 [Urine:550]  General appearance: alert Back: tender in lower back Resp: clear to auscultation bilaterally Cardio: regular rate and rhythm GI: soft, non-tender; bowel sounds normal; no masses,  no organomegaly Extremities: edema +2 with skin redness.  Lab Results:  Recent Labs  01/20/13 2225 01/22/13 0603  WBC 10.0 8.2  HGB 12.8 11.9*  HCT 36.2 33.6*  PLT 244 229   BMET  Recent Labs  01/20/13 2225  NA 136  K 3.7  CL 97  CO2 28  GLUCOSE 119*  BUN 21  CREATININE 0.79  CALCIUM 10.1    Studies/Results: Mr Thoracic Spine Wo Contrast  01/22/2013  *RADIOLOGY REPORT*  Clinical Data: Acute low back pain and thoracic spine pain.  MRI THORACIC SPINE WITHOUT CONTRAST  Technique:  Multiplanar and multiecho pulse sequences of the thoracic spine were obtained without intravenous contrast.  Comparison: Radiographs dated 01/20/2013 and CT scan dated 12/04/2012  Findings: There is an acute or subacute compression fracture of the superior endplate of L1.  There is no protrusion of disc material into the spinal canal.  There is minimal protrusion of bone of the posterior-superior aspect of the L1 vertebral body into the spinal canal with no neural impingement.  Tip of the conus is at L2 and appears normal.  There are old stable compression fractures of the T9 and T11.  The thoracic spinal cord is normal.  There  is no myelopathy or mass lesion or spinal cord compression.  Paraspinal soft tissues are normal except for edema around the fracture at L1.  IMPRESSION:  1. Acute slight compression fracture of the superior endplate of L1.  No neural impingement.  This has the appearance typical for a benign osteoporotic compression fracture. 2.  Old compression fractures of T9 and T11.   Original Report Authenticated By: Francene Boyers, M.D.    Mr Lumbar Spine Wo Contrast  01/22/2013  *RADIOLOGY REPORT*  Clinical Data: Low back pain and right groin pain.  MRI LUMBAR SPINE WITHOUT CONTRAST  Technique:  Multiplanar and multiecho pulse sequences of the lumbar spine were obtained without intravenous contrast.  Comparison: None.  Findings: There is a benign appearing slight compression fracture of the superior aspect of L1.  There is an adjacent Schmorl's node in the superior endplate of L1.  There is slight protrusion of the posterior aspect of the L1 vertebral body into the spinal canal with no neural impingement. Slight edema in the adjacent paraspinal soft tissues.  There is an old compression fracture of T11.  Tip of the conus is at L2 and appears normal.  L1-2: The disc is normal.  L2-3:  Large Schmorl's node in the superior endplate of L3. Minimal bulging of the disc into the neural foramina without neural impingement.  L3-4:  Small broad-based disc bulge with no neural impingement.  L4-5 and L5-L1:  Normal.  IMPRESSION:  1.  Benign-appearing  acute slight compression fracture of the superior  aspect of L1.  No neural impingement. 2.  Old compression fracture of T11. 3.  No other significant abnormalities.   Original Report Authenticated By: Francene Boyers, M.D.     Medications: I have reviewed the patient's current medications.  Assessment/Plan: Bilateral leg edema U/S negative for DVT,  on prophylactic lovenox. Low dose of lasix - edema better. Check K Active Problems:  Back pain, lumbosacral compression fracture  multiple old and new;Marland Kitchen MRI showed acute L1 fx-Dr. Venetia Maxon- noted for Out pt; but with continue pain maybe better if kyphoplasty can be done in hospital Osteoporosis, unspecified=- start on calitonin nasal spray; vit D Essential hypertension, benign controlled  Constipation, narcotic related. On senekot and miralax, has been severe  Weakness/ gait instabilty- PT rec- SNF rehab- Social work consult for SNF- probable monday     LOS: 3 days   HUSAIN,KARRAR 01/23/2013, 6:45 AM

## 2013-01-23 NOTE — Progress Notes (Signed)
Patient ID: [name], female   DOB: 06/05/1936, 77 y.o.   MRN: [id number]  Met with pt, no change in overall condition. Pain persists r/t T11, T12, L1 compression Fx's.  Dr Venetia Maxon also met with pt, discussed options. He discussed pt's case with Dr. Danielle Dess, who agreed to assume care of [name] for Kyphoplasties (likely Monday or Tuesday).  Pt agrees to plan & appreciates Dr. Verlee Rossetti willingness to help.  Georgiann Cocker, RN, BSN

## 2013-01-23 NOTE — Clinical Social Work Psychosocial (Signed)
     Clinical Social Work Department BRIEF PSYCHOSOCIAL ASSESSMENT 01/23/2013  Patient:  [name], [name]     Account Number:  [id number]     Admit date:  01/20/2013  Clinical Social Worker:  Tiburcio Pea  Date/Time:  01/23/2013 05:25 PM  Referred by:  Physician  Date Referred:  01/23/2013 Referred for  SNF Placement   Other Referral:   Interview type:  Other - See comment Other interview type:   Patient and husband    PSYCHOSOCIAL DATA Living Status:  HUSBAND Admitted from facility:   Level of care:   Primary support name:   Primary support relationship to patient:  SPOUSE Degree of support available:   strong support    CURRENT CONCERNS Current Concerns  Post-Acute Placement   Other Concerns:    SOCIAL WORK ASSESSMENT / PLAN CSW met with patient and her husband this afternoon. PT is recommending short term SNF and patient is aware of this recommendation.  She is willing to consider this option and would want placement in either [facility] or in [place].  They were provided a SNF bed list and the process was discussed.  Patient agrees to bed search and the process will be initiated. FL2 placed on chart for MD's signaure.   Assessment/plan status:  Psychosocial Support/Ongoing Assessment of Needs Other assessment/ plan:   Information/referral to community resources:   SNF bed list provided  Aftercare needs discussed including home health and DME to be arranged by the SNF after d/c.    PATIENTS/FAMILYS RESPONSE TO PLAN OF CARE: Patient is alert and oriented; veyr pleasant lady. She and her husband agree to short term SNF stay for therapy. She is sheduled to undergo a Kyphoplasty in the near future. Will hopefully be able to be d/c'd soon to SNF bed.  CSW will assist.

## 2013-01-23 NOTE — Clinical Social Work Placement (Addendum)
    Clinical Social Work Department CLINICAL SOCIAL WORK PLACEMENT NOTE 01/23/2013  Patient:  [name], [name]  Account Number:  [id number] Admit date:  01/20/2013  Clinical Social Worker:  Lupita Leash Kassondra Geil, LCSWA  Date/time:  01/23/2013 05:48 PM  Clinical Social Work is seeking post-discharge placement for this patient at the following level of care:   SKILLED NURSING   (*CSW will update this form in Epic as items are completed)   01/22/2013  Patient/family provided with [facility] Department of Clinical Social Work's list of facilities offering this level of care within the geographic area requested by the patient (or if unable, by the patient's family).  01/23/2013  Patient/family informed of their freedom to choose among providers that offer the needed level of care, that participate in Medicare, Medicaid or managed care program needed by the patient, have an available bed and are willing to accept the patient.  01/23/2013  Patient/family informed of [facility]' ownership interest in [facility], as well as of the fact that they are under no obligation to receive care at this facility.  PASARR submitted to EDS on 01/23/2013 PASARR number received from EDS on 01/23/2013  FL2 transmitted to all facilities in geographic area requested by pt/family on  01/26/13  FL2 transmitted to all facilities within larger geographic area on   Patient informed that his/her managed care company has contracts with or will negotiate with  certain facilities, including the following:NA   Patient/family informed of bed offers received:  01/27/13 Patient chooses bed at  [facility] Physician recommends and patient chooses bed at    Patient to be transferred to [facility]  on  01/28/13 Patient to be transferred to facility by [facility] with husband The following physician request were entered in Epic:   Additional Comments: 01/28/13  Patient's bed search was delayed because  she had hoped to go home after kyphoplasty procedure and Physical Therapy wanted to reassess her after the procedure. Due to pain levels etc, patient elected to seek short term SNF. She wanted a private room at [facility] but one was not available. She was given other SNF offers - places that had private rooms but she chose [facility]. She will currently be in a semi private room that does not have a roommate. She was pleased with this. Patient wants her husband to take her to the facility. Ok today for d/c per MD. Alysia Penna pts nurse Lupita Leash  And SNF of d/c. No further CSW needs identified. CSW signing off.  Lorri Frederick. [place]  [phone]

## 2013-01-23 NOTE — Progress Notes (Signed)
Discussed with Dr.  Donette Larry.  We will proceed with kyphoplasty under Dr. Verlee Rossetti care.

## 2013-01-23 NOTE — Progress Notes (Signed)
Physical Therapy Treatment Patient Details Name: [name] MRN: [id number] DOB: 17-Sep-1936 Today's Date: 01/23/2013 Time: 8657-8469 PT Time Calculation (min): 40 min  PT Assessment / Plan / Recommendation Comments on Treatment Session  77 y/o female adm. for LE swelling and back pain, found to have acute compression fractures. Limited treatment session today because she is just in excrutiating pain with minimal movement. Focused our session on pain control with breathing and relaxation techniques especially during mobility. Educated patient and husband on leg exercises to perform in the bed to keep her as mobile and strong as possible in prep for kyphoplasty hopefully next week.    Follow Up Recommendations  SNF (if kyphoplasty next week we will need to re-evaluate following surgery)     Does the patient have the potential to tolerate intense rehabilitation     Barriers to Discharge        Equipment Recommendations  None recommended by PT    Recommendations for Other Services    Frequency Min 3X/week   Plan Discharge plan remains appropriate;Frequency remains appropriate    Precautions / Restrictions Precautions Precautions: Fall Precaution Comments: multiple compression fractures Restrictions Weight Bearing Restrictions: No   Pertinent Vitals/Pain 10/10 back pain with all movement    Mobility  Bed Mobility Rolling Left: 5: Supervision;With rail Details for Bed Mobility Assistance: attempted rolling right however pt too painful with complaints of "catching in her back"; cues throughout all mobility for controlled breathing with a focus on relaxation; cues for sequencing and assist to flex knees slowly in prep for rolling Transfers Transfers: Not assessed Details for Transfer Assistance: too painful Ambulation/Gait Ambulation/Gait Assistance: Not tested (comment)    Exercises General Exercises - Lower Extremity Ankle Circles/Pumps: AROM;Both;20 reps;Supine Heel  Slides: AAROM;Both;10 reps;Supine Hip ABduction/ADduction: AAROM;Both;10 reps;Supine     PT Goals Acute Rehab PT Goals PT Goal: Rolling Supine to Right Side - Progress: Progressing toward goal PT Goal: Rolling Supine to Left Side - Progress: Progressing toward goal PT Goal: Supine/Side to Sit - Progress: Not progressing PT Goal: Sit to Supine/Side - Progress: Not progressing PT Goal: Sit to Stand - Progress: Not progressing PT Goal: Stand to Sit - Progress: Not progressing PT Goal: Ambulate - Progress: Not progressing Pt will Perform Home Exercise Program: Independently PT Goal: Perform Home Exercise Program - Progress: Goal set today  Visit Information  Last PT Received On: 01/23/13 Assistance Needed: +2 (+2 for transfers)    Subjective Data  Subjective: I will do what you want me to if you don't mind me screaming and crying.  Patient Stated Goal: pain control   Cognition  Cognition Overall Cognitive Status: Appears within functional limits for tasks assessed/performed Arousal/Alertness: Awake/alert Orientation Level: Appears intact for tasks assessed Behavior During Session: [facility] for tasks performed    Balance     End of Session PT - End of Session Activity Tolerance: Patient limited by pain Patient left: in bed;with call bell/phone within reach;with family/visitor present Nurse Communication: Mobility status   GP     [facility] HELEN 01/23/2013, 11:32 AM

## 2013-01-24 DIAGNOSIS — M545 Low back pain: Secondary | ICD-10-CM | POA: Diagnosis not present

## 2013-01-24 DIAGNOSIS — M8448XA Pathological fracture, other site, initial encounter for fracture: Secondary | ICD-10-CM | POA: Diagnosis not present

## 2013-01-24 NOTE — Progress Notes (Signed)
Subjective: Still complains of back pain especially with movement.  Bowels are moving well  Objective: Vital signs in last 24 hours: Temp:  [98.1 F (36.7 C)-98.6 F (37 C)] 98.3 F (36.8 C) (03/15 0607) Pulse Rate:  [61-91] 61 (03/15 0607) Resp:  [16-18] 18 (03/15 0607) BP: (109-133)/(74-96) 133/96 mmHg (03/15 0607) SpO2:  [92 %-99 %] 99 % (03/15 0607) Weight:  [58.695 kg (129 lb 6.4 oz)] 58.695 kg (129 lb 6.4 oz) (03/15 0607) Weight change: 0.181 kg (6.4 oz) Last BM Date: 01/23/13  Intake/Output from previous day: 03/14 0701 - 03/15 0700 In: 960 [P.O.:960] Out: 2100 [Urine:2100] Intake/Output this shift:    Resp: clear to auscultation bilaterally Cardio: regular rate and rhythm, S1, S2 normal, no murmur, click, rub or gallop  Lab Results:  Recent Labs  01/22/13 0603  WBC 8.2  HGB 11.9*  HCT 33.6*  PLT 229   BMET  Recent Labs  01/23/13 0600  NA 138  K 3.7  CL 101  CO2 26  GLUCOSE 94  BUN 13  CREATININE 0.73  CALCIUM 8.8    Studies/Results: No results found.  Medications:  Scheduled: . amitriptyline  50 mg Oral QHS  . amLODipine  10 mg Oral Daily  . calcitonin (salmon)  1 spray Alternating Nares Daily  . cholecalciferol  1,000 Units Oral Daily  . darifenacin  15 mg Oral Daily  . docusate sodium  100 mg Oral BID  . enoxaparin (LOVENOX) injection  40 mg Subcutaneous Q24H  . furosemide  20 mg Oral Daily  . levothyroxine  112 mcg Oral QAC breakfast  . metoprolol succinate  25 mg Oral Daily  . pantoprazole  40 mg Oral Daily  . polyethylene glycol  17 g Oral BID  . potassium chloride  10 mEq Oral Daily  . senna  1 tablet Oral BID  . simvastatin  10 mg Oral q1800  . sodium chloride  3 mL Intravenous Q12H    Assessment/Plan: 1.  Vertebral compression fractures with persistent pain to undergo kyphoplasty by Dr Danielle Dess on Monday am   LOS: 4 days   [facility] 01/24/2013, 8:04 AM

## 2013-01-25 DIAGNOSIS — M8448XA Pathological fracture, other site, initial encounter for fracture: Secondary | ICD-10-CM | POA: Diagnosis not present

## 2013-01-25 DIAGNOSIS — M545 Low back pain: Secondary | ICD-10-CM | POA: Diagnosis not present

## 2013-01-25 NOTE — Progress Notes (Signed)
Subjective: No change in low back pain  Objective: Vital signs in last 24 hours: Temp:  [98 F (36.7 C)-98.3 F (36.8 C)] 98.3 F (36.8 C) (03/16 0542) Pulse Rate:  [81-87] 81 (03/16 0542) Resp:  [18] 18 (03/16 0542) BP: (106-109)/(65-73) 106/73 mmHg (03/16 0542) SpO2:  [96 %-100 %] 100 % (03/16 0542) Weight:  [57.698 kg (127 lb 3.2 oz)] 57.698 kg (127 lb 3.2 oz) (03/16 0542) Weight change: -0.998 kg (-2 lb 3.2 oz) Last BM Date: 01/23/13  Intake/Output from previous day: 03/15 0701 - 03/16 0700 In: 720 [P.O.:720] Out: 2501 [Urine:2500; Stool:1] Intake/Output this shift:    Cardio: regular rate and rhythm, S1, S2 normal, no murmur, click, rub or gallop  Lab Results: No results found for this basename: WBC, HGB, HCT, PLT,  in the last 72 hours BMET  Recent Labs  01/23/13 0600  NA 138  K 3.7  CL 101  CO2 26  GLUCOSE 94  BUN 13  CREATININE 0.73  CALCIUM 8.8    Studies/Results: No results found.  Medications:  Scheduled: . amitriptyline  50 mg Oral QHS  . amLODipine  10 mg Oral Daily  . calcitonin (salmon)  1 spray Alternating Nares Daily  . cholecalciferol  1,000 Units Oral Daily  . darifenacin  15 mg Oral Daily  . docusate sodium  100 mg Oral BID  . enoxaparin (LOVENOX) injection  40 mg Subcutaneous Q24H  . furosemide  20 mg Oral Daily  . levothyroxine  112 mcg Oral QAC breakfast  . metoprolol succinate  25 mg Oral Daily  . pantoprazole  40 mg Oral Daily  . polyethylene glycol  17 g Oral BID  . potassium chloride  10 mEq Oral Daily  . senna  1 tablet Oral BID  . simvastatin  10 mg Oral q1800  . sodium chloride  3 mL Intravenous Q12H    Assessment/Plan: 1. compression fractures await kyphoplasty tomorrow  LOS: 5 days   [name] THOMAS 01/25/2013, 7:59 AM

## 2013-01-26 ENCOUNTER — Encounter (HOSPITAL_COMMUNITY)
Admission: EM | Disposition: A | Discharge status: Skilled Nursing Facility | Payer: Self-pay | Source: Home / Self Care | Attending: Internal Medicine

## 2013-01-26 ENCOUNTER — Other Ambulatory Visit: Payer: Medicare Other

## 2013-01-26 ENCOUNTER — Inpatient Hospital Stay (HOSPITAL_COMMUNITY): Payer: Medicare Other | Admitting: Anesthesiology

## 2013-01-26 ENCOUNTER — Encounter (HOSPITAL_COMMUNITY): Payer: Self-pay | Admitting: Anesthesiology

## 2013-01-26 ENCOUNTER — Inpatient Hospital Stay (HOSPITAL_COMMUNITY): Payer: Medicare Other

## 2013-01-26 ENCOUNTER — Inpatient Hospital Stay: Admission: RE | Admit: 2013-01-26 | Payer: Medicare Other | Source: Ambulatory Visit

## 2013-01-26 DIAGNOSIS — K219 Gastro-esophageal reflux disease without esophagitis: Secondary | ICD-10-CM | POA: Diagnosis not present

## 2013-01-26 DIAGNOSIS — M8448XA Pathological fracture, other site, initial encounter for fracture: Secondary | ICD-10-CM | POA: Diagnosis not present

## 2013-01-26 DIAGNOSIS — M810 Age-related osteoporosis without current pathological fracture: Secondary | ICD-10-CM | POA: Diagnosis not present

## 2013-01-26 DIAGNOSIS — Z853 Personal history of malignant neoplasm of breast: Secondary | ICD-10-CM | POA: Diagnosis not present

## 2013-01-26 DIAGNOSIS — Z86718 Personal history of other venous thrombosis and embolism: Secondary | ICD-10-CM | POA: Diagnosis not present

## 2013-01-26 DIAGNOSIS — K5900 Constipation, unspecified: Secondary | ICD-10-CM | POA: Diagnosis not present

## 2013-01-26 DIAGNOSIS — S22009A Unspecified fracture of unspecified thoracic vertebra, initial encounter for closed fracture: Secondary | ICD-10-CM | POA: Diagnosis not present

## 2013-01-26 DIAGNOSIS — R609 Edema, unspecified: Secondary | ICD-10-CM | POA: Diagnosis not present

## 2013-01-26 DIAGNOSIS — M545 Low back pain: Secondary | ICD-10-CM | POA: Diagnosis not present

## 2013-01-26 DIAGNOSIS — M539 Dorsopathy, unspecified: Secondary | ICD-10-CM | POA: Diagnosis not present

## 2013-01-26 DIAGNOSIS — R0602 Shortness of breath: Secondary | ICD-10-CM | POA: Diagnosis not present

## 2013-01-26 HISTORY — PX: KYPHOPLASTY: SHX5884

## 2013-01-26 LAB — CBC
HCT: 33.3 % — ABNORMAL LOW (ref 36.0–46.0)
Hemoglobin: 11.9 g/dL — ABNORMAL LOW (ref 12.0–15.0)
MCH: 28.8 pg (ref 26.0–34.0)
MCHC: 35.7 g/dL (ref 30.0–36.0)
MCV: 80.6 fL (ref 78.0–100.0)
RBC: 4.13 MIL/uL (ref 3.87–5.11)

## 2013-01-26 SURGERY — KYPHOPLASTY
Anesthesia: General | Site: Spine Thoracic | Wound class: Clean

## 2013-01-26 MED ORDER — SODIUM CHLORIDE 0.9 % IJ SOLN
3.0000 mL | Freq: Two times a day (BID) | INTRAMUSCULAR | Status: DC
  Administered 2013-01-26 – 2013-01-27 (×3): 3 mL via INTRAVENOUS

## 2013-01-26 MED ORDER — FENTANYL CITRATE 0.05 MG/ML IJ SOLN
INTRAMUSCULAR | Status: DC | PRN
  Administered 2013-01-26: 100 ug via INTRAVENOUS

## 2013-01-26 MED ORDER — DIPHENHYDRAMINE HCL 50 MG/ML IJ SOLN
INTRAMUSCULAR | Status: DC | PRN
  Administered 2013-01-26: 12.5 mg via INTRAVENOUS

## 2013-01-26 MED ORDER — VANCOMYCIN HCL 1000 MG IV SOLR
1000.0000 mg | INTRAVENOUS | Status: DC | PRN
  Administered 2013-01-26: 1000 mg via INTRAVENOUS

## 2013-01-26 MED ORDER — GLYCOPYRROLATE 0.2 MG/ML IJ SOLN
INTRAMUSCULAR | Status: DC | PRN
  Administered 2013-01-26: .4 mg via INTRAVENOUS

## 2013-01-26 MED ORDER — PHENYLEPHRINE HCL 10 MG/ML IJ SOLN
INTRAMUSCULAR | Status: DC | PRN
  Administered 2013-01-26 (×3): 80 ug via INTRAVENOUS

## 2013-01-26 MED ORDER — PROPOFOL 10 MG/ML IV BOLUS
INTRAVENOUS | Status: DC | PRN
  Administered 2013-01-26: 100 mg via INTRAVENOUS

## 2013-01-26 MED ORDER — LIDOCAINE-EPINEPHRINE 1 %-1:100000 IJ SOLN
INTRAMUSCULAR | Status: DC | PRN
  Administered 2013-01-26: 4.5 mL

## 2013-01-26 MED ORDER — ONDANSETRON HCL 4 MG/2ML IJ SOLN: 4.0000 mg | INTRAMUSCULAR | Status: DC | PRN

## 2013-01-26 MED ORDER — ONDANSETRON HCL 4 MG/2ML IJ SOLN
INTRAMUSCULAR | Status: DC | PRN
  Administered 2013-01-26: 4 mg via INTRAVENOUS

## 2013-01-26 MED ORDER — ACETAMINOPHEN 650 MG RE SUPP: 650.0000 mg | RECTAL | Status: DC | PRN

## 2013-01-26 MED ORDER — ACETAMINOPHEN 325 MG PO TABS: 650.0000 mg | ORAL_TABLET | ORAL | Status: DC | PRN

## 2013-01-26 MED ORDER — 0.9 % SODIUM CHLORIDE (POUR BTL) OPTIME
TOPICAL | Status: DC | PRN
  Administered 2013-01-26: 1000 mL

## 2013-01-26 MED ORDER — NEOSTIGMINE METHYLSULFATE 1 MG/ML IJ SOLN
INTRAMUSCULAR | Status: DC | PRN
  Administered 2013-01-26: 3 mg via INTRAVENOUS

## 2013-01-26 MED ORDER — EPHEDRINE SULFATE 50 MG/ML IJ SOLN
INTRAMUSCULAR | Status: DC | PRN
  Administered 2013-01-26 (×2): 10 mg via INTRAVENOUS

## 2013-01-26 MED ORDER — PHENOL 1.4 % MT LIQD: 1.0000 | OROMUCOSAL | Status: DC | PRN

## 2013-01-26 MED ORDER — CEFAZOLIN SODIUM 1-5 GM-% IV SOLN: 1.0000 g | Freq: Three times a day (TID) | INTRAVENOUS | Status: DC

## 2013-01-26 MED ORDER — SODIUM CHLORIDE 0.9 % IV SOLN: 250.0000 mL | INTRAVENOUS | Status: DC

## 2013-01-26 MED ORDER — LIDOCAINE HCL (CARDIAC) 20 MG/ML IV SOLN
INTRAVENOUS | Status: DC | PRN
  Administered 2013-01-26: 80 mg via INTRAVENOUS

## 2013-01-26 MED ORDER — MENTHOL 3 MG MT LOZG
1.0000 | LOZENGE | OROMUCOSAL | Status: DC | PRN
  Filled 2013-01-26: qty 9

## 2013-01-26 MED ORDER — ONDANSETRON HCL 4 MG/2ML IJ SOLN: 4.0000 mg | Freq: Once | INTRAMUSCULAR | Status: AC | PRN

## 2013-01-26 MED ORDER — HYDROMORPHONE HCL PF 1 MG/ML IJ SOLN
0.2500 mg | INTRAMUSCULAR | Status: DC | PRN
  Administered 2013-01-26 – 2013-01-27 (×3): 0.5 mg via INTRAVENOUS
  Filled 2013-01-26 (×3): qty 1

## 2013-01-26 MED ORDER — LACTATED RINGERS IV SOLN
INTRAVENOUS | Status: DC | PRN
  Administered 2013-01-26 (×2): via INTRAVENOUS

## 2013-01-26 MED ORDER — SODIUM CHLORIDE 0.9 % IJ SOLN: 3.0000 mL | INTRAMUSCULAR | Status: DC | PRN

## 2013-01-26 MED ORDER — BUPIVACAINE HCL (PF) 0.25 % IJ SOLN
INTRAMUSCULAR | Status: DC | PRN
  Administered 2013-01-26: 4.5 mL

## 2013-01-26 MED ORDER — ROCURONIUM BROMIDE 100 MG/10ML IV SOLN
INTRAVENOUS | Status: DC | PRN
  Administered 2013-01-26: 20 mg via INTRAVENOUS

## 2013-01-26 SURGICAL SUPPLY — 44 items
BANDAGE ADHESIVE 1X3 (GAUZE/BANDAGES/DRESSINGS) ×4 IMPLANT
BLADE SURG 11 STRL SS (BLADE) IMPLANT
BLADE SURG ROTATE 9660 (MISCELLANEOUS) IMPLANT
CEMENT BONE KYPHX HV R (Orthopedic Implant) ×2 IMPLANT
CEMENT KYPHON C01A KIT/MIXER (Cement) ×2 IMPLANT
CLOTH BEACON ORANGE TIMEOUT ST (SAFETY) ×2 IMPLANT
CONT SPEC 4OZ CLIKSEAL STRL BL (MISCELLANEOUS) ×2 IMPLANT
DECANTER SPIKE VIAL GLASS SM (MISCELLANEOUS) ×2 IMPLANT
DERMABOND ADVANCED (GAUZE/BANDAGES/DRESSINGS)
DERMABOND ADVANCED .7 DNX12 (GAUZE/BANDAGES/DRESSINGS) IMPLANT
DRAPE C-ARM 42X72 X-RAY (DRAPES) ×2 IMPLANT
DRAPE INCISE IOBAN 66X45 STRL (DRAPES) ×2 IMPLANT
DRAPE LAPAROTOMY 100X72X124 (DRAPES) ×2 IMPLANT
DRAPE PROXIMA HALF (DRAPES) ×2 IMPLANT
DURAPREP 26ML APPLICATOR (WOUND CARE) ×2 IMPLANT
GAUZE SPONGE 4X4 16PLY XRAY LF (GAUZE/BANDAGES/DRESSINGS) ×2 IMPLANT
GLOVE BIO SURGEON STRL SZ7.5 (GLOVE) IMPLANT
GLOVE BIOGEL PI IND STRL 7.5 (GLOVE) IMPLANT
GLOVE BIOGEL PI IND STRL 8.5 (GLOVE) ×1 IMPLANT
GLOVE BIOGEL PI INDICATOR 7.5 (GLOVE)
GLOVE BIOGEL PI INDICATOR 8.5 (GLOVE) ×1
GLOVE ECLIPSE 8.5 STRL (GLOVE) ×2 IMPLANT
GLOVE EXAM NITRILE LRG STRL (GLOVE) IMPLANT
GLOVE EXAM NITRILE MD LF STRL (GLOVE) IMPLANT
GLOVE EXAM NITRILE XL STR (GLOVE) IMPLANT
GLOVE EXAM NITRILE XS STR PU (GLOVE) IMPLANT
GOWN BRE IMP SLV AUR LG STRL (GOWN DISPOSABLE) IMPLANT
GOWN BRE IMP SLV AUR XL STRL (GOWN DISPOSABLE) ×2 IMPLANT
GOWN STRL REIN 2XL LVL4 (GOWN DISPOSABLE) ×2 IMPLANT
KIT BASIN OR (CUSTOM PROCEDURE TRAY) ×2 IMPLANT
KIT ROOM TURNOVER OR (KITS) ×2 IMPLANT
KYPHON BONE CEMENT AND MIXER PACK ×2 IMPLANT
NEEDLE HYPO 25X1 1.5 SAFETY (NEEDLE) ×2 IMPLANT
NS IRRIG 1000ML POUR BTL (IV SOLUTION) ×2 IMPLANT
PACK SURGICAL SETUP 50X90 (CUSTOM PROCEDURE TRAY) ×2 IMPLANT
PAD ARMBOARD 7.5X6 YLW CONV (MISCELLANEOUS) ×6 IMPLANT
SPECIMEN JAR SMALL (MISCELLANEOUS) IMPLANT
SUT VIC AB 3-0 SH 8-18 (SUTURE) ×2 IMPLANT
SUT VIC AB 4-0 P-3 18X BRD (SUTURE) ×1 IMPLANT
SUT VIC AB 4-0 P3 18 (SUTURE) ×1
SYR CONTROL 10ML LL (SYRINGE) ×2 IMPLANT
TOWEL OR 17X24 6PK STRL BLUE (TOWEL DISPOSABLE) ×2 IMPLANT
TOWEL OR 17X26 10 PK STRL BLUE (TOWEL DISPOSABLE) ×2 IMPLANT
TRAY KYPHOPAK 20/3 ONESTEP 1ST (MISCELLANEOUS) ×2 IMPLANT

## 2013-01-26 NOTE — Progress Notes (Signed)
Patient ID: [name], female   DOB: Mar 30, 1936, 77 y.o.   MRN: [id number] Vital signs stable motor function remains intact. Patient still with back pain for kyphoplasty this morning.

## 2013-01-26 NOTE — Anesthesia Preprocedure Evaluation (Signed)
Anesthesia Evaluation  Patient identified by MRN, date of birth, ID band Patient awake    Reviewed: Allergy & Precautions, H&P , NPO status , Patient's Chart, lab work & pertinent test results  Airway Mallampati: I TM Distance: >3 FB Neck ROM: full    Dental   Pulmonary          Cardiovascular hypertension, + Peripheral Vascular Disease Rhythm:regular Rate:Normal     Neuro/Psych    GI/Hepatic hiatal hernia, GERD-  ,  Endo/Other  Hypothyroidism   Renal/GU      Musculoskeletal  (+) Fibromyalgia -  Abdominal   Peds  Hematology   Anesthesia Other Findings   Reproductive/Obstetrics                           Anesthesia Physical Anesthesia Plan  ASA: II  Anesthesia Plan: General   Post-op Pain Management:    Induction: Intravenous  Airway Management Planned: Oral ETT  Additional Equipment:   Intra-op Plan:   Post-operative Plan: Extubation in OR  Informed Consent: I have reviewed the patients History and Physical, chart, labs and discussed the procedure including the risks, benefits and alternatives for the proposed anesthesia with the patient or authorized representative who has indicated his/her understanding and acceptance.     Plan Discussed with: CRNA, Anesthesiologist and Surgeon  Anesthesia Plan Comments:         Anesthesia Quick Evaluation

## 2013-01-26 NOTE — Progress Notes (Signed)
Subjective: At procedure, unable to interview  Objective: Vital signs in last 24 hours: Temp:  [98.4 F (36.9 C)-98.6 F (37 C)] 98.5 F (36.9 C) (03/17 0523) Pulse Rate:  [88-93] 93 (03/17 0523) Resp:  [18-20] 18 (03/17 0523) BP: (106-113)/(71-77) 113/71 mmHg (03/17 0523) SpO2:  [95 %-96 %] 95 % (03/17 0523) Weight:  [56.9 kg (125 lb 7.1 oz)] 56.9 kg (125 lb 7.1 oz) (03/17 0523) Weight change: -0.797 kg (-1 lb 12.1 oz) Last BM Date: 01/24/13  Intake/Output from previous day: 03/16 0701 - 03/17 0700 In: 960 [P.O.:960] Out: 1801 [Urine:1800; Stool:1] Intake/Output this shift:    General appearance: alert  Lab Results: No results found for this basename: WBC, HGB, HCT, PLT,  in the last 72 hours BMET No results found for this basename: NA, K, CL, CO2, GLUCOSE, BUN, CREATININE, CALCIUM,  in the last 72 hours  Studies/Results: No results found.  Medications: I have reviewed the patient's current medications.  Assessment/Plan: Principal Problem:   Bilateral leg edema no DVT, on lasix Active Problems: L1 compression fracture, kyphoplasty today   Osteoporosis, unspecified   Essential hypertension, controlled   Constipation on laxatives   Possible discharge in am vs NHP, PT to reevaluate after kyphoplasty   LOS: 6 days   [name] 01/26/2013, 7:19 AM

## 2013-01-26 NOTE — Progress Notes (Signed)
Patient ID: [name], female   DOB: 10-Apr-1936, 77 y.o.   MRN: [id number] Vital signs are stable patient feels reasonably well we'll start to mobilize today.  X-rays reviewed position a blue is good cemented vertebrae

## 2013-01-26 NOTE — Anesthesia Postprocedure Evaluation (Signed)
  Anesthesia Post-op Note  Patient: [name]  Procedure(s) Performed: Procedure(s) with comments: KYPHOPLASTY (N/A) - T11 and L1  Patient Location: PACU  Anesthesia Type:General  Level of Consciousness: awake, alert , oriented and patient cooperative  Airway and Oxygen Therapy: Patient Spontanous Breathing  Post-op Pain: mild  Post-op Assessment: Post-op Vital signs reviewed, Patient's Cardiovascular Status Stable, Respiratory Function Stable, Patent Airway, No signs of Nausea or vomiting and Pain level controlled  Post-op Vital Signs: stable  Complications: No apparent anesthesia complications

## 2013-01-26 NOTE — Progress Notes (Signed)
PT Cancellation Note  Patient Details Name: MAKENZEY NANNI MRN: [id number] DOB: [birth year]   Cancelled Treatment:    Reason Eval/Treat Not Completed: Patient at procedure or test/unavailable. Will f/u tomorrow.   [facility] HELEN 01/26/2013, 7:47 AM Pager: [phone]

## 2013-01-26 NOTE — Transfer of Care (Signed)
Immediate Anesthesia Transfer of Care Note  Patient: [name]  Procedure(s) Performed: Procedure(s) with comments: KYPHOPLASTY (N/A) - T11 and L1  Patient Location: PACU  Anesthesia Type:General  Level of Consciousness: awake, alert  and oriented  Airway & Oxygen Therapy: Patient Spontanous Breathing and Patient connected to nasal cannula oxygen  Post-op Assessment: Report given to PACU RN and Post -op Vital signs reviewed and stable  Post vital signs: Reviewed and stable  Complications: No apparent anesthesia complications

## 2013-01-26 NOTE — Op Note (Signed)
Date of surgery: 01/26/2013 Preoperative diagnosis: T11, L1, pathologic osteoporotic fractures Post operative diagnosis: T11, L1, pathologic osteoporotic fractures Procedure: T11, L1 acrylic balloon kyphoplasty Surgeon: Sherilyn Cooter Lakendrick Paradis,MD Indications: Patient is a 77 year old individual who's had severe unrelenting pain in her back. She has evidence of old fractures at the T10 vertebrae and that the T11 vertebrae by x-ray and CT scan. She also has evidence of an old superior endplate fracture of L3 area and MRI recently demonstrated an acute fracture of L1 and acute on chronic fracture of T11. She is now undergoing acrylic balloon kyphoplasty as her pain has become intolerable despite maximal efforts at conservative management. She is been bed bound for several weeks now.  Procedure: The patient was brought to the operating room supine on a stretcher. After the smooth induction of general endotracheal anesthesia the patient was carefully positioned in the prone position on the operating table with the bony prominences appropriately padded and protected. Overalls were used under the patient's chest back was prepped with alcohol and DuraPrep and draped in a sterile fashion and then biplane fluoroscopy was used to isolate the L1 vertebrae. A pedicle entry site for this her vertebrae was chosen 4 cm lateral to the pedicle at the 9:00 position. A Jamshidi needle was inserted into the pedicle via the transfer radicular transvertebral approach. Biplane fluoroscopy was used during this procedure. Then the cannula was in the vertebral body the inner cannula was removed and a bone drill was used to create a space for the balloon. Balloon was then inflated 120 mmHg and was noted to deteriorate to 50 millimeters of pressure. 3 Cc of dye was injected into the balloon. The T11 vertebrae was isolated in a similar fashion. A balloon was inserted here however the pressure was driven to 250 mmHg before 1 cc of dye could be  inserted into the interspace. This was not unexpected as this vertebrae had previously been fractured. Cement was mixed to appropriate consistency and then injected into the vertebral body under biplane fluoroscopy until complete filling was achieved. A total of 4-1/2 cc of cement was injected into the L1 vertebrae. 2 half cc of cement was injected into T11 vertebrae.  Final fluoroscopic images were obtained the Jamshidi needle was removed the incision was closed with a singular 3-0 Vicryl stitch and the patient was then returned to the recovery room in stable condition.

## 2013-01-27 ENCOUNTER — Inpatient Hospital Stay (HOSPITAL_COMMUNITY): Payer: Medicare Other

## 2013-01-27 ENCOUNTER — Encounter (HOSPITAL_COMMUNITY): Payer: Self-pay | Admitting: Neurological Surgery

## 2013-01-27 DIAGNOSIS — M8448XA Pathological fracture, other site, initial encounter for fracture: Secondary | ICD-10-CM | POA: Diagnosis not present

## 2013-01-27 DIAGNOSIS — M4000 Postural kyphosis, site unspecified: Secondary | ICD-10-CM | POA: Diagnosis not present

## 2013-01-27 DIAGNOSIS — M545 Low back pain: Secondary | ICD-10-CM | POA: Diagnosis not present

## 2013-01-27 DIAGNOSIS — R609 Edema, unspecified: Secondary | ICD-10-CM | POA: Diagnosis not present

## 2013-01-27 DIAGNOSIS — K5900 Constipation, unspecified: Secondary | ICD-10-CM | POA: Diagnosis not present

## 2013-01-27 LAB — BASIC METABOLIC PANEL
BUN: 17 mg/dL (ref 6–23)
CO2: 27 mEq/L (ref 19–32)
Calcium: 8.6 mg/dL (ref 8.4–10.5)
Creatinine, Ser: 0.65 mg/dL (ref 0.50–1.10)
GFR calc non Af Amer: 85 mL/min — ABNORMAL LOW (ref >90)
Glucose, Bld: 90 mg/dL (ref 70–99)

## 2013-01-27 NOTE — Progress Notes (Signed)
Subjective: Pain "different".  Moving bowels about every other day  Objective: Vital signs in last 24 hours: Temp:  [97 F (36.1 C)-98.4 F (36.9 C)] 98 F (36.7 C) (03/18 0507) Pulse Rate:  [64-90] 74 (03/18 0507) Resp:  [14-24] 16 (03/18 0507) BP: (95-129)/(62-73) 95/63 mmHg (03/18 0507) SpO2:  [93 %-100 %] 94 % (03/18 0507) Weight:  [58.922 kg (129 lb 14.4 oz)] 58.922 kg (129 lb 14.4 oz) (03/18 0507) Weight change: 2.022 kg (4 lb 7.3 oz) Last BM Date: 01/25/13  Intake/Output from previous day: 03/17 0701 - 03/18 0700 In: 2010 [P.O.:960; I.V.:1050] Out: 1100 [Urine:1100] Intake/Output this shift:    General appearance: alert and cooperative Resp: clear to auscultation bilaterally Cardio: regular rate and rhythm, S1, S2 normal, no murmur, click, rub or gallop Extremities: edema trace  Lab Results:  Recent Labs  01/26/13 0934  WBC 7.1  HGB 11.9*  HCT 33.3*  PLT 207   BMET No results found for this basename: NA, K, CL, CO2, GLUCOSE, BUN, CREATININE, CALCIUM,  in the last 72 hours  Studies/Results: Dg Lumbar Spine 2-3 Views  01/26/2013  *RADIOLOGY REPORT*  Clinical Data: 77 year old female status post T11 and L1 kyphoplasty.  LUMBAR SPINE - 2-3 VIEW  Comparison: 0735 hours the same day and earlier.  Findings: Osteopenia.  Bone cement at the T11 and L1 vertebral levels.  At the L1 level there is extra osseous bone cement, which appears to be mostly laterally on the left.  Some of this is along the needle tract. The remaining extraosseous bone cement is in the paraspinal soft tissues, and some of this might be venous intravasation.  T12 and L3 superior endplate deformities re- identified.  Other lumbar levels appear stable.  IMPRESSION: T11 and L1 kyphoplasty sequelae as above.   Original Report Authenticated By: Erskine Speed, M.D.    Dg Lumbar Spine 2-3 Views  01/26/2013  *RADIOLOGY REPORT*  Clinical Data: Perioperative localization  LUMBAR SPINE - 2-3 VIEW  Comparison: MRI  thoracic spine dated 01/22/2013  Findings: Two intraoperative fluoroscopic images demonstrate vertebral augmentation within two thoracolumbar vertebral bodies, likely T11 and L1 when correlating with the MRI.  Mild superior endplate changes at T12.  IMPRESSION: Intraoperative fluoroscopic images as above.   Original Report Authenticated By: Charline Bills, M.D.     Medications: I have reviewed the patient's current medications.  Assessment/Plan: Principal Problem:  Bilateral leg edema no DVT, on lasix  Active Problems:  L1 compression fracture, kyphoplasty yesterday, PT to reassess today Osteoporosis, unspecified  Essential hypertension, controlled  Constipation on laxatives, moving bowels  Disposition discharge plans depend on PT reassessment, hope to be able to discharge home with home health but ST-NHP still and option   LOS: 7 days   [name] JOSEPH 01/27/2013, 6:04 AM

## 2013-01-27 NOTE — Progress Notes (Signed)
Subjective: Patient reports Still complaining of significant pain though patient was able to get out of bed into chair. Physical therapy to ambulate this morning.  Objective: Vital signs in last 24 hours: Temp:  [98 F (36.7 C)-98.6 F (37 C)] 98.6 F (37 C) (03/18 1007) Pulse Rate:  [74-101] 101 (03/18 1007) Resp:  [16-20] 20 (03/18 1007) BP: (90-115)/(63-69) 90/63 mmHg (03/18 1007) SpO2:  [93 %-99 %] 99 % (03/18 1007) Weight:  [58.922 kg (129 lb 14.4 oz)] 58.922 kg (129 lb 14.4 oz) (03/18 0507)  Intake/Output from previous day: 03/17 0701 - 03/18 0700 In: 2250 [P.O.:1200; I.V.:1050] Out: 1400 [Urine:1400] Intake/Output this shift: Total I/O In: 363 [P.O.:360; I.V.:3] Out: -   Motor function in lower extremities remained stable.  Lab Results:  Recent Labs  01/26/13 0934  WBC 7.1  HGB 11.9*  HCT 33.3*  PLT 207   BMET  Recent Labs  01/27/13 0550  NA 137  K 3.7  CL 102  CO2 27  GLUCOSE 90  BUN 17  CREATININE 0.65  CALCIUM 8.6    Studies/Results: Dg Lumbar Spine 2-3 Views  01/26/2013  *RADIOLOGY REPORT*  Clinical Data: 77 year old female status post T11 and L1 kyphoplasty.  LUMBAR SPINE - 2-3 VIEW  Comparison: 0735 hours the same day and earlier.  Findings: Osteopenia.  Bone cement at the T11 and L1 vertebral levels.  At the L1 level there is extra osseous bone cement, which appears to be mostly laterally on the left.  Some of this is along the needle tract. The remaining extraosseous bone cement is in the paraspinal soft tissues, and some of this might be venous intravasation.  T12 and L3 superior endplate deformities re- identified.  Other lumbar levels appear stable.  IMPRESSION: T11 and L1 kyphoplasty sequelae as above.   Original Report Authenticated By: Erskine Speed, M.D.    Dg Lumbar Spine 2-3 Views  01/26/2013  *RADIOLOGY REPORT*  Clinical Data: Perioperative localization  LUMBAR SPINE - 2-3 VIEW  Comparison: MRI thoracic spine dated 01/22/2013  Findings:  Two intraoperative fluoroscopic images demonstrate vertebral augmentation within two thoracolumbar vertebral bodies, likely T11 and L1 when correlating with the MRI.  Mild superior endplate changes at T12.  IMPRESSION: Intraoperative fluoroscopic images as above.   Original Report Authenticated By: Charline Bills, M.D.     Assessment/Plan: Considerable back pain the patient a bit more mobile this morning.  LOS: 7 days  B. plain x-rays after patient has been ambulated to see status of the vertebrae and evaluate for any fractures.   Yasmine Kilbourne J 01/27/2013, 10:49 AM

## 2013-01-27 NOTE — Progress Notes (Signed)
Physical Therapy Treatment Patient Details Name: [name] MRN: [id number] DOB: Jun 20, 1936 Today's Date: 01/27/2013 Time: 7829-5621 PT Time Calculation (min): 29 min  PT Assessment / Plan / Recommendation Comments on Treatment Session  Patient s/p kyphoplasty for T11 and L1 compression fx presents with improved mobility over last week, but continued pain and concern for decreased pain control upon D/C.  Honestly the only assist her husband can reasonably give is supervision and pt needing increased assist at times when pain is worse.  Recommend STSNF level rehab prior to d/c home for improved pain control on oral medications and improved mobility/safety/decreased fall risk in patient with fragile osteoporosis.    Follow Up Recommendations  SNF           Equipment Recommendations  None recommended by PT       Frequency Min 3X/week   Plan Discharge plan remains appropriate    Precautions / Restrictions Precautions Precautions: Fall Precaution Comments: s/p kyphoplasty instructing in gentle back precautions   Pertinent Vitals/Pain 8/10 in back and left hip with mobility    Mobility  Bed Mobility Bed Mobility: Not assessed (pt up in chair) Details for Bed Mobility Assistance: pt out of bed in recliner, states nurse tech helped her up to [facility] and to recliner Transfers Sit to Stand: 4: Min assist;From chair/3-in-1;With armrests;With upper extremity assist Stand to Sit: 4: Min guard;With upper extremity assist;To chair/3-in-1;With armrests Details for Transfer Assistance: increased assist out of deep low recliner, improved independence to higher straight back chair Ambulation/Gait Ambulation/Gait Assistance: 4: Min guard;5: Supervision Ambulation Distance (Feet): 80 Feet Assistive device: Rolling walker Ambulation/Gait Assistance Details: painful with mobility, but able to keep moving except more antalgic on left than right; stops to straighten up every few steps, but can't  stay straight while walking Gait Pattern: Step-to pattern;Antalgic;Decreased stride length;Shuffle;Trunk flexed     PT Goals Acute Rehab PT Goals Pt will go Sit to Stand: with supervision;with upper extremity assist PT Goal: Sit to Stand - Progress: Progressing toward goal Pt will go Stand to Sit: with supervision;with upper extremity assist PT Goal: Stand to Sit - Progress: Progressing toward goal Pt will Ambulate: 51 - 150 feet;with supervision;with rolling walker PT Goal: Ambulate - Progress: Progressing toward goal  Visit Information  Last PT Received On: 01/27/13 Assistance Needed: +1    Subjective Data  Subjective: Just got a shot, but still hurts   Cognition  Cognition Overall Cognitive Status: Appears within functional limits for tasks assessed/performed Arousal/Alertness: Awake/alert Orientation Level: Appears intact for tasks assessed Behavior During Session: [facility] for tasks performed    Balance  Static Standing Balance Static Standing - Balance Support: Left upper extremity supported Static Standing - Level of Assistance: 5: Stand by assistance Static Standing - Comment/# of Minutes: stood at sink to wash hands leaning on elbows about 2 minutes  End of Session PT - End of Session Equipment Utilized During Treatment: Gait belt Activity Tolerance: Patient limited by pain Patient left: in chair;with call bell/phone within reach;with family/visitor present   GP     [facility] 01/27/2013, 12:31 PM Sheran Lawless, PT [phone] 01/27/2013

## 2013-01-27 NOTE — Progress Notes (Signed)
Occupational Therapy Evaluation   01/27/13 1200  OT Visit Information  Last OT Received On 01/27/13  Assistance Needed +1  PT/OT Co-Evaluation/Treatment Yes  OT Time Calculation  OT Start Time 1040  OT Stop Time 1112  OT Time Calculation (min) 32 min  Precautions  Precautions Fall  Home Living  Lives With Spouse  Available Help at Discharge Family (husband can provide supervision only)  Type of Home House  Home Access Stairs to enter  Entrance Stairs-Number of Steps 2  Entrance Stairs-Rails None  Home Layout One level  Production designer, theatre/television/film - rolling;Straight cane;Shower chair without back  Additional Comments no elevated toilet, reports difficulty getting up and down since back pain started; normally independent until back pain then used cane, RW, and needed assist wtih mobility, self care, etc  Prior Function  Level of Independence Independent  Driving Yes  Communication  Communication No difficulties  ADL  Eating/Feeding Performed;Independent  Where Assessed - Eating/Feeding Chair  Grooming Performed;Wash/dry hands;Minimal assistance  Where Assessed - Grooming Supported standing  Upper Body Bathing Simulated;Minimal assistance  Where Assessed - Upper Body Bathing Unsupported sitting  Lower Body Bathing Simulated;Maximal assistance  Where Assessed - Lower Body Bathing Supported sit to stand  Upper Body Dressing Performed;Minimal assistance  Where Assessed - Upper Body Dressing Unsupported sitting  Lower Body Dressing Simulated;Maximal assistance  Where Assessed - Lower Body Dressing Supported sit to Proofreader Method Sit to Consulting civil engineer and Hygiene Performed;Set up  Where Assessed - Toileting Clothing Manipulation and Hygiene Sit on 3-in-1 or toilet  Equipment Used  Rolling walker  Transfers/Ambulation Related to ADLs min guard with RW and several standing rest breaks due to pain.   ADL Comments Pt limited by pain.  Rested bil UE on sink while washing hands.  Increased time required for all tasks.  Vision - History  Baseline Vision Wears glasses all the time  Patient Visual Report No change from baseline  Cognition  Overall Cognitive Status Appears within functional limits for tasks assessed/performed  Arousal/Alertness Awake/alert  Orientation Level Appears intact for tasks assessed  Behavior During Session [facility] for tasks performed  Right Upper Extremity Assessment  RUE ROM/Strength/Tone [facility] for tasks assessed  Left Upper Extremity Assessment  LUE ROM/Strength/Tone WFL for tasks assessed  Bed Mobility  Bed Mobility Not assessed (pt up in chair)  Transfers  Transfers Sit to Stand;Stand to Sit  Sit to Stand 4: Min assist;From chair/3-in-1;With armrests;With upper extremity assist  Stand to Sit To chair/3-in-1;With upper extremity assist;With armrests;4: Min guard  Details for Transfer Assistance Increased time for lower surface of chair.    OT - End of Session  Equipment Utilized During Treatment Gait belt  Activity Tolerance Patient tolerated treatment well  Patient left in chair;with call bell/phone within reach;with family/visitor present  Nurse Communication Mobility status  OT Assessment  Clinical Impression Statement Pt adm. for LE swelling and back pain, found to have acute compression fractures.  S/p T11 and L1 kyphoplasty on 3/17.  Will benefit from continued OT services to address below problem list. Recommending SNF to progress rehab before returning home.  OT Recommendation/Assessment Patient needs continued OT Services  OT Problem List Decreased strength;Decreased activity tolerance;Decreased knowledge of use of DME or AE;Pain  Barriers to Discharge Decreased caregiver support  Barriers to Discharge Comments husband can only provide  supervision  OT Therapy Diagnosis  Generalized weakness;Acute pain  OT Plan  OT Frequency Min 2X/week  OT Treatment/Interventions Self-care/ADL training;DME and/or AE instruction;Therapeutic activities;Patient/family education  OT Recommendation  Follow Up Recommendations SNF  OT Equipment 3 in 1 bedside comode  Individuals Consulted  Consulted and Agree with Results and Recommendations Patient  Acute Rehab OT Goals  OT Goal Formulation With patient  Time For Goal Achievement 02/10/13  Potential to Achieve Goals Good  ADL Goals  Pt Will Perform Grooming with supervision;Standing at sink  Pt Will Transfer to Toilet with supervision;Ambulation;with DME;Comfort height toilet  Pt Will Perform Lower Body Bathing with supervision;Sit to stand from chair;Sit to stand from bed;with adaptive equipment  Pt Will Perform Lower Body Dressing with supervision;Sit to stand from chair;Sit to stand from bed;with adaptive equipment  Pt Will Perform Upper Body Bathing with supervision;Sitting, chair;Sitting, edge of bed  Pt Will Perform Upper Body Dressing with supervision;Sitting, chair;Sitting, bed  ADL Goal: Grooming - Progress Goal set today  ADL Goal: Upper Body Bathing - Progress Goal set today  ADL Goal: Lower Body Bathing - Progress Goal set today  ADL Goal: Upper Body Dressing - Progress Goal set today  ADL Goal: Lower Body Dressing - Progress Goal set today  ADL Goal: Toilet Transfer - Progress Goal set today  OT General Charges  $OT Visit 1 Procedure  OT Evaluation  $Initial OT Evaluation Tier I 1 Procedure  OT Treatments  $Self Care/Home Management  23-37 mins  01/27/2013 Cipriano Mile OTR/L Pager [phone] Office [phone]

## 2013-01-28 DIAGNOSIS — T148XXA Other injury of unspecified body region, initial encounter: Secondary | ICD-10-CM | POA: Diagnosis not present

## 2013-01-28 DIAGNOSIS — E039 Hypothyroidism, unspecified: Secondary | ICD-10-CM | POA: Diagnosis not present

## 2013-01-28 DIAGNOSIS — M8448XA Pathological fracture, other site, initial encounter for fracture: Secondary | ICD-10-CM | POA: Diagnosis not present

## 2013-01-28 DIAGNOSIS — K5900 Constipation, unspecified: Secondary | ICD-10-CM | POA: Diagnosis not present

## 2013-01-28 DIAGNOSIS — I10 Essential (primary) hypertension: Secondary | ICD-10-CM | POA: Diagnosis not present

## 2013-01-28 DIAGNOSIS — M810 Age-related osteoporosis without current pathological fracture: Secondary | ICD-10-CM | POA: Diagnosis not present

## 2013-01-28 DIAGNOSIS — R609 Edema, unspecified: Secondary | ICD-10-CM | POA: Diagnosis not present

## 2013-01-28 DIAGNOSIS — I82409 Acute embolism and thrombosis of unspecified deep veins of unspecified lower extremity: Secondary | ICD-10-CM | POA: Diagnosis not present

## 2013-01-28 DIAGNOSIS — C50919 Malignant neoplasm of unspecified site of unspecified female breast: Secondary | ICD-10-CM | POA: Diagnosis not present

## 2013-01-28 DIAGNOSIS — IMO0002 Reserved for concepts with insufficient information to code with codable children: Secondary | ICD-10-CM | POA: Diagnosis not present

## 2013-01-28 DIAGNOSIS — M545 Low back pain: Secondary | ICD-10-CM | POA: Diagnosis not present

## 2013-01-28 DIAGNOSIS — Z5189 Encounter for other specified aftercare: Secondary | ICD-10-CM | POA: Diagnosis not present

## 2013-01-28 MED ORDER — CALCIUM CITRATE-VITAMIN D 315-200 MG-UNIT PO TABS: 1.0000 | ORAL_TABLET | Freq: Two times a day (BID) | ORAL | Status: AC

## 2013-01-28 MED ORDER — POLYETHYLENE GLYCOL 3350 17 G PO PACK: 17.0000 g | PACK | Freq: Every day | ORAL | Status: DC

## 2013-01-28 MED ORDER — DSS 100 MG PO CAPS: 100.0000 mg | ORAL_CAPSULE | Freq: Two times a day (BID) | ORAL | Status: DC

## 2013-01-28 MED ORDER — POLYETHYLENE GLYCOL 3350 17 G PO PACK: 17.0000 g | PACK | Freq: Two times a day (BID) | ORAL | Status: DC

## 2013-01-28 MED ORDER — SENNA 8.6 MG PO TABS: 2.0000 | ORAL_TABLET | Freq: Every day | ORAL | Status: DC

## 2013-01-28 MED ORDER — AMLODIPINE BESYLATE 5 MG PO TABS
5.0000 mg | ORAL_TABLET | Freq: Every day | ORAL | Status: DC
  Administered 2013-01-28: 5 mg via ORAL
  Filled 2013-01-28: qty 1

## 2013-01-28 MED ORDER — AMLODIPINE BESYLATE 5 MG PO TABS: 5.0000 mg | ORAL_TABLET | Freq: Every day | ORAL | Status: DC

## 2013-01-28 MED ORDER — HYDROCODONE-ACETAMINOPHEN 5-325 MG PO TABS: 1.0000 | ORAL_TABLET | ORAL | Status: DC | PRN

## 2013-01-28 NOTE — Discharge Summary (Signed)
Physician Discharge Summary  Patient ID: BRITNAY MAGNUSSEN MRN: [id number] DOB/AGE: September 22, 1936 77 y.o.  Admit date: 01/20/2013 Discharge date: 01/28/2013  Admission Diagnoses: Bilateral leg edema Vertebral compression fracture Osteoporosis Constipation Breast cancer History of DVT Hypertension Hypothyroidism  Discharge Diagnoses:  Principal Problem:   Bilateral leg edema Active Problems: L1 vertebral compression fracture Osteoporosis   Breast Cancer, DCIS, Right, Receptor +   History of DVT of lower extremity   Essential hypertension, benign   Unspecified hypothyroidism   Discharged Condition: good  Hospital Course: The patient was admitted on 01/21/2013 complaining of lateral lower extremity edema. She did see her physician the day before and d-dimer level was elevated. She had a history of a DVT in the context of vertebral compression fracture in the past. She had been having significant back pain over the last 4 weeks with x-ray showing probable new L1 compression fracture. She developed constipation on oxycodone and was being treated with laxatives. She was started on IV heparin. The day of admission a venous Doppler ultrasound of the lower extremities showed no DVT. Heparin was discontinued and she was started on Lovenox prophylactically. Her main reason for continued admission was severe unrelenting low back pain. MRI of the thoracic and lumbar spine was done and showed an acute component of superior endplate fracture of T11 some superimposed on old fracture and an acute slight compression fracture of the superior endplate of L1 without neural impingement. The patient was seen by her neurosurgeon Dr. Venetia Maxon and on Monday, March 17 underwent T11, L1 echo at balloon kyphoplasty by Dr. Danielle Dess. The patient did have some improvement in pain with a 24-hour so still significant pain requiring narcotics for low back pain. A followup x-ray on March 18 showed no significant change from prior  study, there was contrast extravasation noted at L1 which was stable, no new fracture. Thoracic spine series on March 18 showed some contrast extending into the thecal sac posteriorly but no new fractures. The patient was seen by physical therapy and occupational therapy at several occasions and it was felt that she was a good candidate for short-term nursing home placement for rehabilitation as she was still limited in transfers ambulation and her husband can only provide limited help at home. She did have peripheral edema bilaterally of admission this improved markedly with low dose Lasix which was discontinued at discharge an elevation of her legs. Her constipation was adequately treated with MiraLAX and Senokot and Colace, those will be continued until she is able to discontinue narcotic use. Her blood pressures running in the low 100 systolic and her amlodipine dose was decreased.  Consults: Neurosurgery  Significant Diagnostic Studies: labs: At discharge sodium 137 potassium 3.7 chloride 102 BUN 17 creatinine 0.65, WBC 7.1 hemoglobin 11.9 and radiology: X-Ray: Lumbar thoracic series as above and MRI: As above  Treatments: analgesia: Vicodin and Dilaudid, anticoagulation: ASA and heparin and surgery: Kyphoplasty L1 and T11 vertebrae  Discharge Exam: Blood pressure 113/66, pulse 100, temperature 98.1 F (36.7 C), temperature source Oral, resp. rate 18, height 5' (1.524 m), weight 58.877 kg (129 lb 12.8 oz), SpO2 96.00%. Resp: clear to auscultation bilaterally Cardio: regular rate and rhythm, S1, S2 normal, no murmur, click, rub or gallop  Disposition: 01-Home or Self Care   Future Appointments Provider Department Dept Phone   02/13/2013 10:30 AM Currie Paris, MD [facility], [place] [phone]   09/03/2013 10:30 AM Lowella Dell, MD [facility] MEDICAL ONCOLOGY [phone]  Medication List    STOP taking these medications        HYDROcodone-acetaminophen 5-500 MG per tablet  Commonly known as:  VICODIN  Replaced by:  HYDROcodone-acetaminophen 5-325 MG per tablet      TAKE these medications       amitriptyline 50 MG tablet  Commonly known as:  ELAVIL  Take 25-50 mg by mouth at bedtime.     amLODipine 5 MG tablet  Commonly known as:  NORVASC  Take 1 tablet (5 mg total) by mouth daily.     aspirin 81 MG tablet  Take 81 mg by mouth daily.     BONIVA 150 MG tablet  Generic drug:  ibandronate  Take 150 mg by mouth every 30 (thirty) days.     calcium citrate-vitamin D 315-200 MG-UNIT per tablet  Commonly known as:  CITRACAL+D  Take 1 tablet by mouth 2 (two) times daily.     cholecalciferol 1000 UNITS tablet  Commonly known as:  VITAMIN D  Take 1,000 Units by mouth daily. Vitamin D3     DSS 100 MG Caps  Take 100 mg by mouth 2 (two) times daily.     ENABLEX 15 MG 24 hr tablet  Generic drug:  darifenacin  Take 15 mg by mouth daily.     HYDROcodone-acetaminophen 5-325 MG per tablet  Commonly known as:  NORCO/VICODIN  Take 1 tablet by mouth every 4 (four) hours as needed.     levothyroxine 112 MCG tablet  Commonly known as:  SYNTHROID, LEVOTHROID  Take 112 mcg by mouth daily.     metoprolol succinate 25 MG 24 hr tablet  Commonly known as:  TOPROL-XL  Take 1 tablet (25 mg total) by mouth daily.     multivitamin tablet  Take 1 tablet by mouth daily.     polyethylene glycol packet  Commonly known as:  MIRALAX / GLYCOLAX  Take 17 g by mouth 2 (two) times daily.     pravastatin 20 MG tablet  Commonly known as:  PRAVACHOL  Take 20 mg by mouth every evening.     PRILOSEC OTC 20 MG tablet  Generic drug:  omeprazole  Take 20 mg by mouth daily.     senna 8.6 MG Tabs  Commonly known as:  SENOKOT  Take 2 tablets (17.2 mg total) by mouth at bedtime.           Follow-up Information   Follow up with Lillia Mountain, MD In 3 weeks.   Contact information:   [address], [address] Jaynie Crumble [place] [address] [phone]       Follow up with STERN,JOSEPH D, MD In 3 weeks.   Contact information:   [address] [place] [address] [phone]       Signed: Lillia Mountain 01/28/2013, 7:35 AM

## 2013-01-28 NOTE — Progress Notes (Signed)
Discharged to [facility] in [place] via car transport by spouse.  Packet for SNF given to patient, Neha Waight and she was instructed to give to personnel at [facility].  Report called to SNF.  No c/o by patient.  Skin warm and dry.  VSS.

## 2013-01-28 NOTE — Progress Notes (Signed)
Physical Therapy Treatment Patient Details Name: [name] MRN: [id number] DOB: December 24, 1935 Today's Date: 01/28/2013 Time: 4098-1191 PT Time Calculation (min): 29 min  PT Assessment / Plan / Recommendation Comments on Treatment Session  s/p kyphoplasty for T11 and L2 compression fx. Pain more controlled reporting 5/10 today. Session focused on education to protect her back from further injury especially with regard to avoiding flexion exercises and lifting objects from the floor. Pt became tearful and expressed concerns about maintaining her independence. Encouraged her to continue with rehab to progress her strength and to discuss lifting with her surgeon. To d/c to SNF today.    Follow Up Recommendations  SNF     Does the patient have the potential to tolerate intense rehabilitation     Barriers to Discharge        Equipment Recommendations  None recommended by PT    Recommendations for Other Services    Frequency Min 3X/week   Plan Discharge plan remains appropriate;Frequency remains appropriate    Precautions / Restrictions Precautions Precautions: Fall Precaution Comments: s/p kyphoplasty instructing in gentle back precautions with focus on refraining from flexion activities Restrictions Weight Bearing Restrictions: No   Pertinent Vitals/Pain Reports 5/10 pain in her back    Mobility  Bed Mobility Bed Mobility: Rolling Left;Left Sidelying to Sit;Sit to Sidelying Left Rolling Left: 4: Min guard Left Sidelying to Sit: 4: Min assist;With rails;HOB elevated;HOB flat Sit to Sidelying Left: 4: Min assist;With rail;HOB elevated;HOB flat Details for Bed Mobility Assistance: verbal instruction for rolling technique to protect back; patient performed rolling->sidelying->sit x2, the first was with railing and HOB elevated and the second was with flat bed and no rail; min tactile assist to guide movement  Transfers Transfers: Sit to Stand;Stand to Sit Sit to Stand: 4: Min  assist;With upper extremity assist;From bed Stand to Sit: 4: Min assist;With upper extremity assist;To bed Details for Transfer Assistance: verbal cues for safe hand placement and decreased bending, minA for stability  Ambulation/Gait Ambulation/Gait Assistance: 4: Min guard Ambulation Distance (Feet): 2 Feet Assistive device: Rolling walker Ambulation/Gait Assistance Details: 2 side steps to HOB, gaurding for stability Gait Pattern: Trunk flexed General Gait Details: fixed kyphosis      PT Goals Acute Rehab PT Goals PT Goal: Rolling Supine to Left Side - Progress: Progressing toward goal PT Goal: Supine/Side to Sit - Progress: Progressing toward goal PT Goal: Sit to Supine/Side - Progress: Progressing toward goal PT Goal: Sit to Stand - Progress: Progressing toward goal PT Goal: Stand to Sit - Progress: Progressing toward goal  Visit Information  Last PT Received On: 01/28/13 Assistance Needed: +1    Subjective Data  Subjective: I just got back to bed.  Patient Stated Goal: wishes she could stay here a few more days so taht she could just go home   Cognition  Cognition Overall Cognitive Status: Appears within functional limits for tasks assessed/performed Arousal/Alertness: Awake/alert Orientation Level: Appears intact for tasks assessed Behavior During Session: [facility] for tasks performed Cognition - Other Comments: emotional today, wants to return to being independent    Balance     End of Session PT - End of Session Equipment Utilized During Treatment: Gait belt Activity Tolerance: Patient tolerated treatment well Patient left: in bed;with call bell/phone within reach;with family/visitor present Nurse Communication: Mobility status   GP     [facility] HELEN 01/28/2013, 11:05 AM

## 2013-01-28 NOTE — Plan of Care (Signed)
Problem: Discharge Progression Outcomes Goal: Able to perform self care activities Outcome: Completed/Met Date Met:  01/28/13 Discharging to SNF, [facility] in [place].  Baseline for discharge is help with ADLs per PT/OT recommendations.

## 2013-01-28 NOTE — Progress Notes (Signed)
Pt c/o "diarrhea" with 3 stools this morning.  I assisted pt with 1st stool @ 0800 - stool was soft-formed light brown in small amount and no odor.  Upon review of scheduled medications, pt is on Senokot bid, docusate bid, and miralax bid - all given 03/18  For pm doses.  Will hold this morning's doses per pt request.  Dr. Valentina Lucks notified via telephone prior to discharge to facility this am.  Discharge medications will be adjusted.

## 2013-02-05 DIAGNOSIS — T148XXA Other injury of unspecified body region, initial encounter: Secondary | ICD-10-CM | POA: Diagnosis not present

## 2013-02-05 DIAGNOSIS — M810 Age-related osteoporosis without current pathological fracture: Secondary | ICD-10-CM | POA: Diagnosis not present

## 2013-02-05 DIAGNOSIS — E039 Hypothyroidism, unspecified: Secondary | ICD-10-CM | POA: Diagnosis not present

## 2013-02-09 DIAGNOSIS — M8448XA Pathological fracture, other site, initial encounter for fracture: Secondary | ICD-10-CM | POA: Diagnosis not present

## 2013-02-13 ENCOUNTER — Ambulatory Visit (INDEPENDENT_AMBULATORY_CARE_PROVIDER_SITE_OTHER): Payer: Medicare Other | Admitting: Surgery

## 2013-02-13 DIAGNOSIS — T148XXA Other injury of unspecified body region, initial encounter: Secondary | ICD-10-CM | POA: Diagnosis not present

## 2013-02-13 DIAGNOSIS — M810 Age-related osteoporosis without current pathological fracture: Secondary | ICD-10-CM | POA: Diagnosis not present

## 2013-02-17 DIAGNOSIS — M8448XD Pathological fracture, other site, subsequent encounter for fracture with routine healing: Secondary | ICD-10-CM | POA: Diagnosis not present

## 2013-02-17 DIAGNOSIS — IMO0001 Reserved for inherently not codable concepts without codable children: Secondary | ICD-10-CM | POA: Diagnosis not present

## 2013-02-17 DIAGNOSIS — Z86718 Personal history of other venous thrombosis and embolism: Secondary | ICD-10-CM | POA: Diagnosis not present

## 2013-02-17 DIAGNOSIS — M810 Age-related osteoporosis without current pathological fracture: Secondary | ICD-10-CM | POA: Diagnosis not present

## 2013-02-17 DIAGNOSIS — I10 Essential (primary) hypertension: Secondary | ICD-10-CM | POA: Diagnosis not present

## 2013-02-17 DIAGNOSIS — K219 Gastro-esophageal reflux disease without esophagitis: Secondary | ICD-10-CM | POA: Diagnosis not present

## 2013-02-18 DIAGNOSIS — I10 Essential (primary) hypertension: Secondary | ICD-10-CM | POA: Diagnosis not present

## 2013-02-18 DIAGNOSIS — K219 Gastro-esophageal reflux disease without esophagitis: Secondary | ICD-10-CM | POA: Diagnosis not present

## 2013-02-18 DIAGNOSIS — IMO0001 Reserved for inherently not codable concepts without codable children: Secondary | ICD-10-CM | POA: Diagnosis not present

## 2013-02-18 DIAGNOSIS — M810 Age-related osteoporosis without current pathological fracture: Secondary | ICD-10-CM | POA: Diagnosis not present

## 2013-02-18 DIAGNOSIS — M8448XD Pathological fracture, other site, subsequent encounter for fracture with routine healing: Secondary | ICD-10-CM | POA: Diagnosis not present

## 2013-02-18 DIAGNOSIS — Z86718 Personal history of other venous thrombosis and embolism: Secondary | ICD-10-CM | POA: Diagnosis not present

## 2013-02-20 DIAGNOSIS — IMO0001 Reserved for inherently not codable concepts without codable children: Secondary | ICD-10-CM | POA: Diagnosis not present

## 2013-02-20 DIAGNOSIS — M810 Age-related osteoporosis without current pathological fracture: Secondary | ICD-10-CM | POA: Diagnosis not present

## 2013-02-20 DIAGNOSIS — K219 Gastro-esophageal reflux disease without esophagitis: Secondary | ICD-10-CM | POA: Diagnosis not present

## 2013-02-20 DIAGNOSIS — M8448XD Pathological fracture, other site, subsequent encounter for fracture with routine healing: Secondary | ICD-10-CM | POA: Diagnosis not present

## 2013-02-20 DIAGNOSIS — Z86718 Personal history of other venous thrombosis and embolism: Secondary | ICD-10-CM | POA: Diagnosis not present

## 2013-02-20 DIAGNOSIS — I10 Essential (primary) hypertension: Secondary | ICD-10-CM | POA: Diagnosis not present

## 2013-02-21 DIAGNOSIS — M810 Age-related osteoporosis without current pathological fracture: Secondary | ICD-10-CM | POA: Diagnosis not present

## 2013-02-21 DIAGNOSIS — Z86718 Personal history of other venous thrombosis and embolism: Secondary | ICD-10-CM | POA: Diagnosis not present

## 2013-02-21 DIAGNOSIS — IMO0001 Reserved for inherently not codable concepts without codable children: Secondary | ICD-10-CM | POA: Diagnosis not present

## 2013-02-21 DIAGNOSIS — I10 Essential (primary) hypertension: Secondary | ICD-10-CM | POA: Diagnosis not present

## 2013-02-21 DIAGNOSIS — K219 Gastro-esophageal reflux disease without esophagitis: Secondary | ICD-10-CM | POA: Diagnosis not present

## 2013-02-21 DIAGNOSIS — M8448XD Pathological fracture, other site, subsequent encounter for fracture with routine healing: Secondary | ICD-10-CM | POA: Diagnosis not present

## 2013-02-24 DIAGNOSIS — D649 Anemia, unspecified: Secondary | ICD-10-CM | POA: Diagnosis not present

## 2013-02-24 DIAGNOSIS — R5381 Other malaise: Secondary | ICD-10-CM | POA: Diagnosis not present

## 2013-02-24 DIAGNOSIS — IMO0001 Reserved for inherently not codable concepts without codable children: Secondary | ICD-10-CM | POA: Diagnosis not present

## 2013-02-24 DIAGNOSIS — E039 Hypothyroidism, unspecified: Secondary | ICD-10-CM | POA: Diagnosis not present

## 2013-02-24 DIAGNOSIS — I10 Essential (primary) hypertension: Secondary | ICD-10-CM | POA: Diagnosis not present

## 2013-02-25 DIAGNOSIS — Z86718 Personal history of other venous thrombosis and embolism: Secondary | ICD-10-CM | POA: Diagnosis not present

## 2013-02-25 DIAGNOSIS — M810 Age-related osteoporosis without current pathological fracture: Secondary | ICD-10-CM | POA: Diagnosis not present

## 2013-02-25 DIAGNOSIS — I10 Essential (primary) hypertension: Secondary | ICD-10-CM | POA: Diagnosis not present

## 2013-02-25 DIAGNOSIS — K219 Gastro-esophageal reflux disease without esophagitis: Secondary | ICD-10-CM | POA: Diagnosis not present

## 2013-02-25 DIAGNOSIS — M8448XD Pathological fracture, other site, subsequent encounter for fracture with routine healing: Secondary | ICD-10-CM | POA: Diagnosis not present

## 2013-02-25 DIAGNOSIS — IMO0001 Reserved for inherently not codable concepts without codable children: Secondary | ICD-10-CM | POA: Diagnosis not present

## 2013-03-03 DIAGNOSIS — M8448XD Pathological fracture, other site, subsequent encounter for fracture with routine healing: Secondary | ICD-10-CM | POA: Diagnosis not present

## 2013-03-03 DIAGNOSIS — Z86718 Personal history of other venous thrombosis and embolism: Secondary | ICD-10-CM | POA: Diagnosis not present

## 2013-03-03 DIAGNOSIS — K219 Gastro-esophageal reflux disease without esophagitis: Secondary | ICD-10-CM | POA: Diagnosis not present

## 2013-03-03 DIAGNOSIS — IMO0001 Reserved for inherently not codable concepts without codable children: Secondary | ICD-10-CM | POA: Diagnosis not present

## 2013-03-03 DIAGNOSIS — M810 Age-related osteoporosis without current pathological fracture: Secondary | ICD-10-CM | POA: Diagnosis not present

## 2013-03-03 DIAGNOSIS — I10 Essential (primary) hypertension: Secondary | ICD-10-CM | POA: Diagnosis not present

## 2013-03-12 ENCOUNTER — Other Ambulatory Visit: Payer: Self-pay | Admitting: Internal Medicine

## 2013-03-12 DIAGNOSIS — I10 Essential (primary) hypertension: Secondary | ICD-10-CM | POA: Diagnosis not present

## 2013-03-12 DIAGNOSIS — E039 Hypothyroidism, unspecified: Secondary | ICD-10-CM | POA: Diagnosis not present

## 2013-03-12 DIAGNOSIS — M810 Age-related osteoporosis without current pathological fracture: Secondary | ICD-10-CM

## 2013-03-12 DIAGNOSIS — M353 Polymyalgia rheumatica: Secondary | ICD-10-CM | POA: Diagnosis not present

## 2013-03-12 DIAGNOSIS — D509 Iron deficiency anemia, unspecified: Secondary | ICD-10-CM | POA: Diagnosis not present

## 2013-03-20 ENCOUNTER — Ambulatory Visit (INDEPENDENT_AMBULATORY_CARE_PROVIDER_SITE_OTHER): Payer: Medicare Other | Admitting: Surgery

## 2013-03-24 DIAGNOSIS — Z961 Presence of intraocular lens: Secondary | ICD-10-CM | POA: Diagnosis not present

## 2013-03-27 ENCOUNTER — Other Ambulatory Visit: Payer: Medicare Other

## 2013-04-07 ENCOUNTER — Ambulatory Visit (INDEPENDENT_AMBULATORY_CARE_PROVIDER_SITE_OTHER): Payer: Medicare Other | Admitting: Surgery

## 2013-04-10 ENCOUNTER — Other Ambulatory Visit: Payer: Medicare Other

## 2013-04-22 DIAGNOSIS — M545 Low back pain: Secondary | ICD-10-CM | POA: Diagnosis not present

## 2013-04-22 DIAGNOSIS — M810 Age-related osteoporosis without current pathological fracture: Secondary | ICD-10-CM | POA: Diagnosis not present

## 2013-04-22 DIAGNOSIS — M4120 Other idiopathic scoliosis, site unspecified: Secondary | ICD-10-CM | POA: Diagnosis not present

## 2013-04-24 ENCOUNTER — Ambulatory Visit (INDEPENDENT_AMBULATORY_CARE_PROVIDER_SITE_OTHER): Payer: Medicare Other | Admitting: Surgery

## 2013-04-28 DIAGNOSIS — M549 Dorsalgia, unspecified: Secondary | ICD-10-CM | POA: Diagnosis not present

## 2013-04-30 ENCOUNTER — Other Ambulatory Visit: Payer: Medicare Other

## 2013-05-18 ENCOUNTER — Ambulatory Visit (INDEPENDENT_AMBULATORY_CARE_PROVIDER_SITE_OTHER): Payer: Medicare Other | Admitting: Surgery

## 2013-05-21 DIAGNOSIS — M546 Pain in thoracic spine: Secondary | ICD-10-CM | POA: Diagnosis not present

## 2013-05-21 DIAGNOSIS — D509 Iron deficiency anemia, unspecified: Secondary | ICD-10-CM | POA: Diagnosis not present

## 2013-05-21 DIAGNOSIS — M353 Polymyalgia rheumatica: Secondary | ICD-10-CM | POA: Diagnosis not present

## 2013-05-21 DIAGNOSIS — I10 Essential (primary) hypertension: Secondary | ICD-10-CM | POA: Diagnosis not present

## 2013-05-28 ENCOUNTER — Other Ambulatory Visit: Payer: Medicare Other

## 2013-06-19 ENCOUNTER — Ambulatory Visit (INDEPENDENT_AMBULATORY_CARE_PROVIDER_SITE_OTHER): Payer: Medicare Other | Admitting: Surgery

## 2013-06-19 ENCOUNTER — Encounter (INDEPENDENT_AMBULATORY_CARE_PROVIDER_SITE_OTHER): Payer: Self-pay | Admitting: Surgery

## 2013-06-19 VITALS — BP 120/64 | HR 80 | Temp 97.9°F | Resp 15 | Ht 60.0 in | Wt 123.4 lb

## 2013-06-19 DIAGNOSIS — Z853 Personal history of malignant neoplasm of breast: Secondary | ICD-10-CM

## 2013-06-19 NOTE — Patient Instructions (Signed)
Continue annual follow ups

## 2013-06-19 NOTE — Progress Notes (Signed)
NAME: [name]       DOB: Apr 08, 1936           DATE: 06/19/2013       MRN: [id number]   [name] is a 77 y.o.Marland Kitchenfemale who presents for routine followup of her Right DCIS diagnosed in 2012 and treated with lumpectomy and radiation She has no problems or concerns on either side.She has elected no anti-estrogen therapy  PFSH: She has had no significant changes since the last visit here.  ROS: There have been no significant changes since the last visit here  EXAM: General: The patient is alert, oriented, generally healty appearing, NAD. Mood and affect are normal.  Breasts: Right breast is smaller and There is noticeable loss of tissue at the lumpectomy site, not firm. The breast is otherwise normal as is the left side  Lymphatics: She has no axillary or supraclavicular adenopathy on either side.  Extremities: Full ROM of the surgical side with no lymphedema noted.  Data Reviewed: 3 D mammogram was negative Reviewed her notes from Dr Tami Lin  Impression: Doing well, with no evidence of recurrent cancer or new cancer  Plan: Will continue to follow up here in six months.

## 2013-07-29 DIAGNOSIS — M810 Age-related osteoporosis without current pathological fracture: Secondary | ICD-10-CM | POA: Diagnosis not present

## 2013-07-29 DIAGNOSIS — M545 Low back pain: Secondary | ICD-10-CM | POA: Diagnosis not present

## 2013-07-29 DIAGNOSIS — M4120 Other idiopathic scoliosis, site unspecified: Secondary | ICD-10-CM | POA: Diagnosis not present

## 2013-07-29 DIAGNOSIS — S22009A Unspecified fracture of unspecified thoracic vertebra, initial encounter for closed fracture: Secondary | ICD-10-CM | POA: Diagnosis not present

## 2013-08-25 DIAGNOSIS — D509 Iron deficiency anemia, unspecified: Secondary | ICD-10-CM | POA: Diagnosis not present

## 2013-08-25 DIAGNOSIS — I10 Essential (primary) hypertension: Secondary | ICD-10-CM | POA: Diagnosis not present

## 2013-08-25 DIAGNOSIS — Z1331 Encounter for screening for depression: Secondary | ICD-10-CM | POA: Diagnosis not present

## 2013-08-25 DIAGNOSIS — M545 Low back pain: Secondary | ICD-10-CM | POA: Diagnosis not present

## 2013-08-25 DIAGNOSIS — M353 Polymyalgia rheumatica: Secondary | ICD-10-CM | POA: Diagnosis not present

## 2013-08-25 DIAGNOSIS — K219 Gastro-esophageal reflux disease without esophagitis: Secondary | ICD-10-CM | POA: Diagnosis not present

## 2013-08-27 DIAGNOSIS — L570 Actinic keratosis: Secondary | ICD-10-CM | POA: Diagnosis not present

## 2013-08-27 DIAGNOSIS — Z85828 Personal history of other malignant neoplasm of skin: Secondary | ICD-10-CM | POA: Diagnosis not present

## 2013-08-27 DIAGNOSIS — Z23 Encounter for immunization: Secondary | ICD-10-CM | POA: Diagnosis not present

## 2013-08-27 DIAGNOSIS — L259 Unspecified contact dermatitis, unspecified cause: Secondary | ICD-10-CM | POA: Diagnosis not present

## 2013-09-03 ENCOUNTER — Telehealth: Payer: Self-pay | Admitting: Oncology

## 2013-09-03 ENCOUNTER — Ambulatory Visit (HOSPITAL_BASED_OUTPATIENT_CLINIC_OR_DEPARTMENT_OTHER): Payer: Medicare Other | Admitting: Oncology

## 2013-09-03 ENCOUNTER — Encounter (INDEPENDENT_AMBULATORY_CARE_PROVIDER_SITE_OTHER): Payer: Self-pay

## 2013-09-03 VITALS — BP 115/76 | HR 88 | Temp 98.0°F | Resp 18 | Ht 60.0 in | Wt 129.3 lb

## 2013-09-03 DIAGNOSIS — Z170 Estrogen receptor positive status [ER+]: Secondary | ICD-10-CM

## 2013-09-03 DIAGNOSIS — D0590 Unspecified type of carcinoma in situ of unspecified breast: Secondary | ICD-10-CM

## 2013-09-03 DIAGNOSIS — D0511 Intraductal carcinoma in situ of right breast: Secondary | ICD-10-CM

## 2013-09-03 NOTE — Progress Notes (Signed)
ID: JOCI DRESS   DOB: August 22, 1936  MR#: [id number]  CSN#:[id number]  PCP: Annette Mountain, MD GYN: SU:  OTHER MD:   HISTORY OF PRESENT ILLNESS: The patient had routine annual screening mammography at [facility] 07/30/2011 showing a new area of microcalcifications in the mid upper outer quadrant. She was brought back for additional views 08/20/2011, and biopsy the same day showed high-grade ductal carcinoma in situ. Breast MRI 08/27/2011 showed a 4.1 cm ill-defined area of non-masslike enhancement associated with biopsy clip. There were no other areas of concern, but incidentally a 4.7 cm ascending thoracic aortic aneurysm was noted.  On 10/17/2011 the patient underwent right lumpectomy and sentinel lymph node sampling ([id number]) for a high-grade ductal carcinoma in situ measuring at least 3.8 cm, with a negative but very close posterior margin and  negative sentinel lymph nodes. The tumor was 100% estrogen receptor positive and 48% progesterone receptor positive. Postoperatively the patient received of adjuvant radiation therapy completed 01/22/2012.   INTERVAL HISTORY: [name] returns today accompanied for followup of her right breast carcinoma. The interval history is unremarkable.Marland Kitchen "From a breast cancer point of view, I am doing fine"  REVIEW OF SYSTEMS: [name] continues to have back pain and muscle aches. She is known to have multiple compression fractures, which are not related to her cancer. She is on iron therapy which she tolerates moderately well, with minimal stomach upset and no significant constipation. She is now taking docusate at present. Otherwise a detailed review of systems today was stable  PAST MEDICAL HISTORY: Past Medical History  Diagnosis Date  . GERD (gastroesophageal reflux disease)   . Hypercholesteremia   . Osteoporosis   . Thyroid disease   . Rib fractures   . Fracture lumbar vertebra-closed   . Fracture of thoracic vertebra, closed   . History of  blood clots   . Cataract 3 and 10/92    bilateral  . Hypertension     DR [name]  . Aneurysm   . Osteoporosis   . DVT (deep venous thrombosis) 08/2011    LL extremity   . History of radiation therapy 01/2012    R breast  . Hypothyroidism   . Anxiety   . Shortness of breath   . H/O hiatal hernia   . Cancer 10/12    bx/right breast  . 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 Fibromyalgia    the patient had a blood clot in August of 2002 in the setting of hormone replacement. That was discontinued. In August of 2012, she had a second blood clot, which occurred shortly after a vertebral fracture curtailed her usual activity level. She had a left thigh growth removed approximately 10 years ago which initially was thought possibly to be a lymphoma but apparently that was never confirmed and there has been no recurrence of over the past 10 years  PAST SURGICAL HISTORY: Past Surgical History  Procedure Laterality Date  . Hernia repair  10/16/2006    [facility] - Dr [name]  . Appendectomy  1940  . Tonsillectomy  1944  . Cataract extraction  1992  . Eye surgery  1940, 1956  . Mastectomy, partial  10/17/2011    Procedure: MASTECTOMY PARTIAL;  Surgeon: Annette Paris, MD;  Location: [facility] OR;  Service: General;  Laterality: Right;  needle guided  . Breast surgery    . Kyphoplasty N/A 01/26/2013    Procedure: KYPHOPLASTY;  Surgeon: Annette Abu, MD;  Location: [facility] ORS;  Service: Neurosurgery;  Laterality: N/A;  T11 and L1    FAMILY HISTORY Family History  Problem Relation Age of Onset  . Heart disease Mother   . Heart disease Maternal Uncle   . Heart disease Maternal Grandfather    the patient's father died at the age of 16, the patient is not sure of the cause; the patient's mother died at the age of 1 with congestive heart failure. The patient had no brothers, 2 sisters. There is no history of breast or ovarian cancer in the family  GYNECOLOGIC HISTORY: Menarche age 62,  she is GX P0. She'll underwent menopause in her mid 33s. 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 Her husband [name] used to be a Human resources officer and later Theatre stage manager for the [employer]. They are the only once at home, with no pets. The patient [place]ends the [place] [employer]   ADVANCED DIRECTIVES: not in place  HEALTH MAINTENANCE: History  Substance Use Topics  . Smoking status: Never Smoker   . Smokeless tobacco: Never Used  . Alcohol Use: No     Colonoscopy: 2005 per pt  PAP: n/a  Bone density: 2011/ osteoporosis  Lipid panel: utd  Allergies  Allergen Reactions  . Contrast Media [Iodinated Diagnostic Agents] Shortness Of Breath  . Iohexol Shortness Of Breath  . Penicillins Rash    Current Outpatient Prescriptions  Medication Sig Dispense Refill  . amitriptyline (ELAVIL) 50 MG tablet Take 25-50 mg by mouth at bedtime.       Marland Kitchen amLODipine (NORVASC) 5 MG tablet Take 1 tablet (5 mg total) by mouth daily.  30 tablet  11  . aspirin 81 MG tablet Take 81 mg by mouth daily.      Marland Kitchen BONIVA 150 MG tablet Take 150 mg by mouth every 30 (thirty) days.       . calcium citrate-vitamin D (CITRACAL+D) 315-200 MG-UNIT per tablet Take 1 tablet by mouth 2 (two) times daily.  60 tablet  11  . cholecalciferol (VITAMIN D) 1000 UNITS tablet Take 1,000 Units by mouth daily. Vitamin D3      . docusate sodium 100 MG CAPS Take 100 mg by mouth 2 (two) times daily.  60 capsule  11  . ENABLEX 15 MG 24 hr tablet Take 15 mg by mouth daily.       Marland Kitchen HYDROcodone-acetaminophen (NORCO/VICODIN) 5-325 MG per tablet Take 1 tablet by mouth every 4 (four) hours as needed.  120 tablet  2  . iron polysaccharides (NU-IRON) 150 MG capsule Take 150 mg by mouth 2 (two) times daily.      Marland Kitchen levothyroxine (SYNTHROID, LEVOTHROID) 112 MCG tablet Take 112 mcg by mouth daily.      . metoprolol  succinate (TOPROL-XL) 25 MG 24 hr tablet Take 1 tablet (25 mg total) by mouth daily.  30 tablet  11  . Multiple Vitamin (MULTIVITAMIN) tablet Take 1 tablet by mouth daily.       . polyethylene glycol (MIRALAX / GLYCOLAX) packet Take 17 g by mouth daily.  14 each  3  . pravastatin (PRAVACHOL) 20 MG tablet Take 20 mg by mouth every evening.       . prednisoLONE 5 MG TABS tablet Take by mouth.      Marland Kitchen PRILOSEC OTC 20 MG tablet Take 20 mg by mouth daily.        No current facility-administered medications for this  visit.    OBJECTIVE: Elderly white woman who appears stated age 39 Vitals:   09/03/13 1038  BP: 115/76  Pulse: 88  Temp: 98 F (36.7 C)  Resp: 18     Body mass index is 25.25 kg/(m^2).    ECOG FS: 1 Filed Weights   09/03/13 1038  Weight: 129 lb 4.8 oz (58.65 kg)   Sclerae unicteric Oropharynx clear No peripheral adenopathy Lungs no rales or rhonchi Heart regular rate and rhythm Abd soft, nontender with positive bowel sounds MSK: severe kyphosis and scoliosis, no focal spinal tenderness Neuro: nonfocal, well oriented Breasts: Right breast status post lumpectomy, with no evidence of local recurrence; left breast is unremarkable. The right axilla is benign  LAB RESULTS: Lab Results  Component Value Date   WBC 7.1 01/26/2013   NEUTROABS 4.1 11/19/2011   HGB 11.9* 01/26/2013   HCT 33.3* 01/26/2013   MCV 80.6 01/26/2013   PLT 207 01/26/2013      Chemistry      Component Value Date/Time   NA 137 01/27/2013 0550   K 3.7 01/27/2013 0550   CL 102 01/27/2013 0550   CO2 27 01/27/2013 0550   BUN 17 01/27/2013 0550   CREATININE 0.65 01/27/2013 0550      Component Value Date/Time   CALCIUM 8.6 01/27/2013 0550   ALKPHOS 62 11/19/2011 1036   AST 21 11/19/2011 1036   ALT 21 11/19/2011 1036   BILITOT 0.3 11/19/2011 1036      STUDIES: Most recent mammogram at [facility] on 08/03/2013 was unremarkable   ASSESSMENT: 77 y.o.  [place] woman   (1)  status post right lumpectomy and  sentinel lymph node sampling December 2012 for a high-grade ductal carcinoma in situ, estrogen and progesterone receptor positive,   (2)  treated adjuvantly with radiation completed mid March 2013  (3) Now followed with observation alone  PLAN:  [name] is doing fine from a breast cancer point of view. We're going to continue to see her on a once a year basis until she completes her 5 years of followup. She knows to call for any problems that may develop before her next visit here.  MAGRINAT,GUSTAV C    09/03/2013

## 2013-09-03 NOTE — Telephone Encounter (Signed)
, °

## 2013-09-29 DIAGNOSIS — N318 Other neuromuscular dysfunction of bladder: Secondary | ICD-10-CM | POA: Diagnosis not present

## 2013-10-23 DIAGNOSIS — H0019 Chalazion unspecified eye, unspecified eyelid: Secondary | ICD-10-CM | POA: Diagnosis not present

## 2013-10-29 DIAGNOSIS — H00019 Hordeolum externum unspecified eye, unspecified eyelid: Secondary | ICD-10-CM | POA: Diagnosis not present

## 2013-11-24 DIAGNOSIS — R071 Chest pain on breathing: Secondary | ICD-10-CM | POA: Diagnosis not present

## 2013-11-24 DIAGNOSIS — R5381 Other malaise: Secondary | ICD-10-CM | POA: Diagnosis not present

## 2013-11-24 DIAGNOSIS — M353 Polymyalgia rheumatica: Secondary | ICD-10-CM | POA: Diagnosis not present

## 2014-01-17 ENCOUNTER — Other Ambulatory Visit: Payer: Self-pay | Admitting: Cardiothoracic Surgery

## 2014-01-18 ENCOUNTER — Other Ambulatory Visit: Payer: Self-pay | Admitting: Cardiothoracic Surgery

## 2014-01-18 DIAGNOSIS — I10 Essential (primary) hypertension: Secondary | ICD-10-CM

## 2014-01-22 DIAGNOSIS — N899 Noninflammatory disorder of vagina, unspecified: Secondary | ICD-10-CM | POA: Diagnosis not present

## 2014-01-26 DIAGNOSIS — M810 Age-related osteoporosis without current pathological fracture: Secondary | ICD-10-CM | POA: Diagnosis not present

## 2014-01-26 DIAGNOSIS — M549 Dorsalgia, unspecified: Secondary | ICD-10-CM | POA: Diagnosis not present

## 2014-01-26 DIAGNOSIS — K219 Gastro-esophageal reflux disease without esophagitis: Secondary | ICD-10-CM | POA: Diagnosis not present

## 2014-01-26 DIAGNOSIS — E785 Hyperlipidemia, unspecified: Secondary | ICD-10-CM | POA: Diagnosis not present

## 2014-01-26 DIAGNOSIS — M353 Polymyalgia rheumatica: Secondary | ICD-10-CM | POA: Diagnosis not present

## 2014-01-26 DIAGNOSIS — I10 Essential (primary) hypertension: Secondary | ICD-10-CM | POA: Diagnosis not present

## 2014-01-26 DIAGNOSIS — Z0000 Encounter for general adult medical examination without abnormal findings: Secondary | ICD-10-CM | POA: Diagnosis not present

## 2014-01-26 DIAGNOSIS — Z23 Encounter for immunization: Secondary | ICD-10-CM | POA: Diagnosis not present

## 2014-01-26 DIAGNOSIS — Z1331 Encounter for screening for depression: Secondary | ICD-10-CM | POA: Diagnosis not present

## 2014-01-28 ENCOUNTER — Other Ambulatory Visit: Payer: Self-pay

## 2014-01-28 DIAGNOSIS — I712 Thoracic aortic aneurysm, without rupture, unspecified: Secondary | ICD-10-CM

## 2014-02-10 ENCOUNTER — Other Ambulatory Visit: Payer: Self-pay | Admitting: Obstetrics & Gynecology

## 2014-02-10 DIAGNOSIS — N9089 Other specified noninflammatory disorders of vulva and perineum: Secondary | ICD-10-CM | POA: Diagnosis not present

## 2014-02-18 DIAGNOSIS — N819 Female genital prolapse, unspecified: Secondary | ICD-10-CM | POA: Diagnosis not present

## 2014-02-18 DIAGNOSIS — N9089 Other specified noninflammatory disorders of vulva and perineum: Secondary | ICD-10-CM | POA: Diagnosis not present

## 2014-02-18 DIAGNOSIS — N905 Atrophy of vulva: Secondary | ICD-10-CM | POA: Diagnosis not present

## 2014-02-24 DIAGNOSIS — I712 Thoracic aortic aneurysm, without rupture, unspecified: Secondary | ICD-10-CM | POA: Diagnosis not present

## 2014-03-04 ENCOUNTER — Other Ambulatory Visit: Payer: Self-pay | Admitting: *Deleted

## 2014-03-10 ENCOUNTER — Other Ambulatory Visit: Payer: Self-pay | Admitting: *Deleted

## 2014-03-10 DIAGNOSIS — Z91041 Radiographic dye allergy status: Secondary | ICD-10-CM

## 2014-03-10 MED ORDER — DIPHENHYDRAMINE HCL 25 MG PO TABS: 50.0000 mg | ORAL_TABLET | Freq: Once | ORAL | Status: DC

## 2014-03-10 MED ORDER — PREDNISONE 50 MG PO TABS: ORAL_TABLET | ORAL | Status: AC

## 2014-03-17 ENCOUNTER — Encounter: Payer: Self-pay | Admitting: Cardiothoracic Surgery

## 2014-03-17 ENCOUNTER — Encounter (HOSPITAL_COMMUNITY): Payer: Self-pay

## 2014-03-17 ENCOUNTER — Ambulatory Visit (INDEPENDENT_AMBULATORY_CARE_PROVIDER_SITE_OTHER): Payer: Medicare Other | Admitting: Cardiothoracic Surgery

## 2014-03-17 ENCOUNTER — Ambulatory Visit (HOSPITAL_COMMUNITY)
Admission: RE | Admit: 2014-03-17 | Discharge: 2014-03-17 | Disposition: A | Discharge status: Home / Self Care | Payer: Medicare Other | Source: Ambulatory Visit | Attending: Cardiothoracic Surgery | Admitting: Cardiothoracic Surgery

## 2014-03-17 VITALS — BP 130/86 | HR 109 | Resp 16 | Ht 60.0 in | Wt 129.0 lb

## 2014-03-17 DIAGNOSIS — I712 Thoracic aortic aneurysm, without rupture, unspecified: Secondary | ICD-10-CM | POA: Diagnosis not present

## 2014-03-17 MED ORDER — DIPHENHYDRAMINE HCL 50 MG/ML IJ SOLN
INTRAMUSCULAR | Status: AC
  Administered 2014-03-17: 25 mg via INTRAVENOUS
  Filled 2014-03-17: qty 1

## 2014-03-17 MED ORDER — IOHEXOL 350 MG/ML SOLN
100.0000 mL | Freq: Once | INTRAVENOUS | Status: AC | PRN
  Administered 2014-03-17: 100 mL via INTRAVENOUS

## 2014-03-17 MED ORDER — DIPHENHYDRAMINE HCL 50 MG/ML IJ SOLN
25.0000 mg | Freq: Once | INTRAMUSCULAR | Status: AC
  Administered 2014-03-17: 25 mg via INTRAVENOUS
  Filled 2014-03-17: qty 0.5

## 2014-03-17 NOTE — Progress Notes (Signed)
PCP is [name], MD Referring Provider is [name], MD  Chief Complaint  Patient presents with  . Thoracic Aortic Aneurysm    1 year f/u with Chest CTA    HPI: 78 year old [name] with hypertension returns for annual CT scan of the thoracic aorta. She was diagnosed with having a fusiform ascending thoracic aneurysm in 2013-measured at 4.4 cm. Since that time she has had annual scans without change in the diameter of her ascending aorta. She's had no symptoms of chest pain. Since her last visit she has undergone back surgery-kyphoplasty by Dr. Ellene Route. She's had some fatigue during the recovery phase but no chest pain. She is taking hydrocodone as needed still for her post op skeletal back pain.  CT is reviewed today with the patient showing a stable fusiform ascending aneurysm measuring 4.4 cm diameter. There is no evidence of penetrating ulcer or intramural hematoma.  2012 the patient had a myocardial perfusion scan with ejection fraction of 62%.   Past Medical History  Diagnosis Date  . GERD (gastroesophageal reflux disease)   . Hypercholesteremia   . Osteoporosis   . Thyroid disease   . Rib fractures   . Fracture lumbar vertebra-closed   . Fracture of thoracic vertebra, closed   . History of blood clots   . Cataract 3 and 10/92    bilateral  . Hypertension     DR Orinda Kenner  . Aneurysm   . Osteoporosis   . DVT (deep venous thrombosis) 08/2011    LL extremity   . History of radiation therapy 01/2012    R breast  . Hypothyroidism   . Anxiety   . Shortness of breath   . H/O hiatal hernia   . Cancer 10/12    bx/right breast  . 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 Fibromyalgia     Past Surgical History  Procedure Laterality Date  . Hernia repair  10/16/2006    [facility] - Dr Hassell Done  . Appendectomy  1940  . Tonsillectomy  1944  . Cataract extraction  1992  . Eye surgery  1940, 1956  . Mastectomy, partial  10/17/2011    Procedure:  MASTECTOMY PARTIAL;  Surgeon: Haywood Lasso, MD;  Location: [facility];  Service: General;  Laterality: Right;  needle guided  . Breast surgery    . Kyphoplasty N/A 01/26/2013    Procedure: KYPHOPLASTY;  Surgeon: Kristeen Miss, MD;  Location: [facility];  Service: Neurosurgery;  Laterality: N/A;  T11 and L1    Family History  Problem Relation Age of Onset  . Heart disease Mother   . Heart disease Maternal Uncle   . Heart disease Maternal Grandfather     Social History History  Substance Use Topics  . Smoking status: Never Smoker   . Smokeless tobacco: Never Used  . Alcohol Use: No    Current Outpatient Prescriptions  Medication Sig Dispense Refill  . amitriptyline (ELAVIL) 50 MG tablet Take 25-50 mg by mouth at bedtime.       Marland Kitchen amLODipine (NORVASC) 5 MG tablet Take 1 tablet (5 mg total) by mouth daily.  30 tablet  11  . aspirin 81 MG tablet Take 81 mg by mouth daily.      Marland Kitchen BONIVA 150 MG tablet Take 150 mg by mouth every 30 (thirty) days.       . calcium citrate-vitamin D (CITRACAL+D) 315-200 MG-UNIT per tablet Take 1 tablet by mouth 2 (two) times  daily.  Annette tablet  11  . cholecalciferol (VITAMIN D) 1000 UNITS tablet Take 1,000 Units by mouth daily. Vitamin D3      . ENABLEX 15 MG 24 hr tablet Take 15 mg by mouth daily.       Marland Kitchen HYDROcodone-acetaminophen (NORCO/VICODIN) 5-325 MG per tablet Take 1 tablet by mouth every 8 (eight) hours as needed.      . iron polysaccharides (NU-IRON) 150 MG capsule Take 150 mg by mouth 2 (two) times daily.      Marland Kitchen levothyroxine (SYNTHROID, LEVOTHROID) 112 MCG tablet Take 112 mcg by mouth daily.      . metoprolol succinate (TOPROL-XL) 25 MG 24 hr tablet TAKE 1 TABLET EACH DAY.  30 tablet  11  . Multiple Vitamin (MULTIVITAMIN) tablet Take 1 tablet by mouth daily.       . polyethylene glycol (MIRALAX / GLYCOLAX) packet Take 17 g by mouth daily.  14 each  3  . pravastatin (PRAVACHOL) 20 MG tablet Take 20 mg by mouth every evening.       . prednisoLONE 5  MG TABS tablet Take by mouth.      Marland Kitchen PRILOSEC OTC 20 MG tablet Take 20 mg by mouth daily.        No current facility-administered medications for this visit.    Allergies  Allergen Reactions  . Contrast Media [Iodinated Diagnostic Agents] Shortness Of Breath  . Iohexol Shortness Of Breath  . Penicillins Rash    Review of Systems she denies symptoms of angina. She denies orthopnea PND. She is concerned her blood pressure may become high after being discharged from the hospital on only 5 mg of Norvasc but she is still actively taking narcotics for postoperative pain.  BP 130/86  Pulse 109  Resp 16  Ht 5' (1.524 m)  Wt 129 lb (58.514 kg)  BMI 25.19 kg/m2  SpO2 95% Physical Exam Alert and comfortable Lungs clear Heart rhythm regular murmur Peripheral pulses intact  Diagnostic Tests: CTA of thoracic aorta reviewed with patient and results indicate there is been no change in a mild aneurysmal dilatation. No indication for surgery until the diameter exceeds 5.0 cm.  Aortic Size Index= 4.4 cm        /Body surface area is 1.57 meters squared. =2.75   < 2.75 cm/m2      4% risk per year 2.75 to 4.25          8% risk per year > 4.25 cm/m2    20% risk per year Impression: Stable fusiform aneurysm  Plan: CT scan one year, continue good blood pressure control

## 2014-03-25 DIAGNOSIS — Z961 Presence of intraocular lens: Secondary | ICD-10-CM | POA: Diagnosis not present

## 2014-03-25 DIAGNOSIS — H5000 Unspecified esotropia: Secondary | ICD-10-CM | POA: Diagnosis not present

## 2014-03-25 DIAGNOSIS — H31009 Unspecified chorioretinal scars, unspecified eye: Secondary | ICD-10-CM | POA: Diagnosis not present

## 2014-05-05 ENCOUNTER — Other Ambulatory Visit: Payer: Self-pay | Admitting: Orthopaedic Surgery

## 2014-05-05 DIAGNOSIS — M84350A Stress fracture, pelvis, initial encounter for fracture: Secondary | ICD-10-CM

## 2014-05-05 DIAGNOSIS — M25559 Pain in unspecified hip: Secondary | ICD-10-CM | POA: Diagnosis not present

## 2014-05-09 ENCOUNTER — Ambulatory Visit
Admission: RE | Admit: 2014-05-09 | Discharge: 2014-05-09 | Disposition: A | Discharge status: Home / Self Care | Payer: Medicare Other | Source: Ambulatory Visit | Attending: Orthopaedic Surgery | Admitting: Orthopaedic Surgery

## 2014-05-09 DIAGNOSIS — M84350A Stress fracture, pelvis, initial encounter for fracture: Secondary | ICD-10-CM

## 2014-05-09 DIAGNOSIS — S3210XA Unspecified fracture of sacrum, initial encounter for closed fracture: Secondary | ICD-10-CM | POA: Diagnosis not present

## 2014-05-13 DIAGNOSIS — S322XXA Fracture of coccyx, initial encounter for closed fracture: Secondary | ICD-10-CM | POA: Diagnosis not present

## 2014-05-13 DIAGNOSIS — S3210XA Unspecified fracture of sacrum, initial encounter for closed fracture: Secondary | ICD-10-CM | POA: Diagnosis not present

## 2014-05-19 ENCOUNTER — Other Ambulatory Visit (HOSPITAL_COMMUNITY): Payer: Self-pay | Admitting: Orthopaedic Surgery

## 2014-05-19 DIAGNOSIS — M533 Sacrococcygeal disorders, not elsewhere classified: Secondary | ICD-10-CM

## 2014-05-26 ENCOUNTER — Ambulatory Visit (HOSPITAL_COMMUNITY)
Admission: RE | Admit: 2014-05-26 | Discharge: 2014-05-26 | Disposition: A | Discharge status: Home / Self Care | Payer: Medicare Other | Source: Ambulatory Visit | Attending: Orthopaedic Surgery | Admitting: Orthopaedic Surgery

## 2014-05-26 DIAGNOSIS — M533 Sacrococcygeal disorders, not elsewhere classified: Secondary | ICD-10-CM

## 2014-05-26 DIAGNOSIS — M8448XA Pathological fracture, other site, initial encounter for fracture: Secondary | ICD-10-CM | POA: Diagnosis not present

## 2014-06-07 DIAGNOSIS — N905 Atrophy of vulva: Secondary | ICD-10-CM | POA: Diagnosis not present

## 2014-06-07 DIAGNOSIS — N819 Female genital prolapse, unspecified: Secondary | ICD-10-CM | POA: Diagnosis not present

## 2014-06-08 DIAGNOSIS — M25579 Pain in unspecified ankle and joints of unspecified foot: Secondary | ICD-10-CM | POA: Diagnosis not present

## 2014-06-09 ENCOUNTER — Telehealth (HOSPITAL_COMMUNITY): Payer: Self-pay | Admitting: Interventional Radiology

## 2014-06-09 NOTE — Telephone Encounter (Signed)
Called pt, told her that her insurance denied the request for her sacroplasty. She states understanding and will call [employer] and appeal their decision. JM

## 2014-06-15 DIAGNOSIS — N8111 Cystocele, midline: Secondary | ICD-10-CM | POA: Diagnosis not present

## 2014-06-15 DIAGNOSIS — N859 Noninflammatory disorder of uterus, unspecified: Secondary | ICD-10-CM | POA: Diagnosis not present

## 2014-06-15 DIAGNOSIS — Z86718 Personal history of other venous thrombosis and embolism: Secondary | ICD-10-CM | POA: Diagnosis not present

## 2014-06-15 DIAGNOSIS — N814 Uterovaginal prolapse, unspecified: Secondary | ICD-10-CM | POA: Diagnosis not present

## 2014-07-23 DIAGNOSIS — N952 Postmenopausal atrophic vaginitis: Secondary | ICD-10-CM | POA: Diagnosis not present

## 2014-07-23 DIAGNOSIS — L259 Unspecified contact dermatitis, unspecified cause: Secondary | ICD-10-CM | POA: Diagnosis not present

## 2014-07-23 DIAGNOSIS — N819 Female genital prolapse, unspecified: Secondary | ICD-10-CM | POA: Diagnosis not present

## 2014-07-23 DIAGNOSIS — N8111 Cystocele, midline: Secondary | ICD-10-CM | POA: Diagnosis not present

## 2014-07-30 DIAGNOSIS — R7301 Impaired fasting glucose: Secondary | ICD-10-CM | POA: Diagnosis not present

## 2014-07-30 DIAGNOSIS — Z23 Encounter for immunization: Secondary | ICD-10-CM | POA: Diagnosis not present

## 2014-07-30 DIAGNOSIS — I10 Essential (primary) hypertension: Secondary | ICD-10-CM | POA: Diagnosis not present

## 2014-07-30 DIAGNOSIS — M353 Polymyalgia rheumatica: Secondary | ICD-10-CM | POA: Diagnosis not present

## 2014-08-24 DIAGNOSIS — M549 Dorsalgia, unspecified: Secondary | ICD-10-CM | POA: Diagnosis not present

## 2014-08-24 DIAGNOSIS — M791 Myalgia: Secondary | ICD-10-CM | POA: Diagnosis not present

## 2014-08-24 DIAGNOSIS — N814 Uterovaginal prolapse, unspecified: Secondary | ICD-10-CM | POA: Diagnosis not present

## 2014-08-24 DIAGNOSIS — Z86718 Personal history of other venous thrombosis and embolism: Secondary | ICD-10-CM | POA: Diagnosis not present

## 2014-08-24 DIAGNOSIS — I719 Aortic aneurysm of unspecified site, without rupture: Secondary | ICD-10-CM | POA: Diagnosis not present

## 2014-08-24 DIAGNOSIS — Z4689 Encounter for fitting and adjustment of other specified devices: Secondary | ICD-10-CM | POA: Diagnosis not present

## 2014-08-24 DIAGNOSIS — N841 Polyp of cervix uteri: Secondary | ICD-10-CM | POA: Diagnosis not present

## 2014-09-02 ENCOUNTER — Telehealth: Payer: Self-pay | Admitting: Nurse Practitioner

## 2014-09-02 NOTE — Telephone Encounter (Signed)
, °

## 2014-09-06 ENCOUNTER — Ambulatory Visit: Payer: Medicare Other | Admitting: Nurse Practitioner

## 2014-09-22 DIAGNOSIS — N390 Urinary tract infection, site not specified: Secondary | ICD-10-CM | POA: Diagnosis not present

## 2014-09-22 DIAGNOSIS — R351 Nocturia: Secondary | ICD-10-CM | POA: Diagnosis not present

## 2014-09-22 DIAGNOSIS — N3942 Incontinence without sensory awareness: Secondary | ICD-10-CM | POA: Diagnosis not present

## 2014-09-22 DIAGNOSIS — R350 Frequency of micturition: Secondary | ICD-10-CM | POA: Diagnosis not present

## 2014-09-23 ENCOUNTER — Telehealth: Payer: Self-pay | Admitting: Nurse Practitioner

## 2014-09-23 NOTE — Telephone Encounter (Signed)
pt wanted to r/s due to no transportation-r/s & fgave pt new time & date

## 2014-09-27 ENCOUNTER — Ambulatory Visit: Payer: Medicare Other | Admitting: Nurse Practitioner

## 2014-09-27 DIAGNOSIS — N813 Complete uterovaginal prolapse: Secondary | ICD-10-CM | POA: Diagnosis not present

## 2014-09-27 DIAGNOSIS — N765 Ulceration of vagina: Secondary | ICD-10-CM | POA: Diagnosis not present

## 2014-09-27 DIAGNOSIS — N952 Postmenopausal atrophic vaginitis: Secondary | ICD-10-CM | POA: Diagnosis not present

## 2014-10-11 DIAGNOSIS — N813 Complete uterovaginal prolapse: Secondary | ICD-10-CM | POA: Diagnosis not present

## 2014-10-13 DIAGNOSIS — N813 Complete uterovaginal prolapse: Secondary | ICD-10-CM | POA: Diagnosis not present

## 2014-10-13 DIAGNOSIS — N952 Postmenopausal atrophic vaginitis: Secondary | ICD-10-CM | POA: Diagnosis not present

## 2014-10-21 DIAGNOSIS — N390 Urinary tract infection, site not specified: Secondary | ICD-10-CM | POA: Diagnosis not present

## 2014-10-21 DIAGNOSIS — N813 Complete uterovaginal prolapse: Secondary | ICD-10-CM | POA: Diagnosis not present

## 2014-10-22 ENCOUNTER — Telehealth: Payer: Self-pay | Admitting: Nurse Practitioner

## 2014-10-22 NOTE — Telephone Encounter (Signed)
pt cld left a vm stating wantedt o r/s appt for 12/14-cld pt back left vm & adv pt to call back to r/s appt time & dtae she desired

## 2014-10-25 ENCOUNTER — Ambulatory Visit: Payer: Medicare Other | Admitting: Nurse Practitioner

## 2014-11-02 DIAGNOSIS — Z853 Personal history of malignant neoplasm of breast: Secondary | ICD-10-CM | POA: Diagnosis not present

## 2014-11-22 DIAGNOSIS — N812 Incomplete uterovaginal prolapse: Secondary | ICD-10-CM | POA: Diagnosis not present

## 2014-11-23 ENCOUNTER — Encounter: Payer: Self-pay | Admitting: Oncology

## 2014-11-23 ENCOUNTER — Encounter: Payer: Self-pay | Admitting: Emergency Medicine

## 2015-01-09 ENCOUNTER — Other Ambulatory Visit: Payer: Self-pay | Admitting: Cardiothoracic Surgery

## 2015-01-26 DIAGNOSIS — N814 Uterovaginal prolapse, unspecified: Secondary | ICD-10-CM | POA: Diagnosis not present

## 2015-02-03 DIAGNOSIS — E039 Hypothyroidism, unspecified: Secondary | ICD-10-CM | POA: Diagnosis not present

## 2015-02-03 DIAGNOSIS — E782 Mixed hyperlipidemia: Secondary | ICD-10-CM | POA: Diagnosis not present

## 2015-02-03 DIAGNOSIS — Z1389 Encounter for screening for other disorder: Secondary | ICD-10-CM | POA: Diagnosis not present

## 2015-02-03 DIAGNOSIS — M353 Polymyalgia rheumatica: Secondary | ICD-10-CM | POA: Diagnosis not present

## 2015-02-03 DIAGNOSIS — K219 Gastro-esophageal reflux disease without esophagitis: Secondary | ICD-10-CM | POA: Diagnosis not present

## 2015-02-03 DIAGNOSIS — N3281 Overactive bladder: Secondary | ICD-10-CM | POA: Diagnosis not present

## 2015-02-03 DIAGNOSIS — I10 Essential (primary) hypertension: Secondary | ICD-10-CM | POA: Diagnosis not present

## 2015-02-03 DIAGNOSIS — M797 Fibromyalgia: Secondary | ICD-10-CM | POA: Diagnosis not present

## 2015-03-04 ENCOUNTER — Other Ambulatory Visit: Payer: Self-pay | Admitting: *Deleted

## 2015-03-04 DIAGNOSIS — I712 Thoracic aortic aneurysm, without rupture, unspecified: Secondary | ICD-10-CM

## 2015-03-25 ENCOUNTER — Other Ambulatory Visit: Payer: Self-pay | Admitting: *Deleted

## 2015-03-25 DIAGNOSIS — Z91041 Radiographic dye allergy status: Secondary | ICD-10-CM

## 2015-03-25 MED ORDER — PREDNISONE 10 MG (21) PO TBPK: 10.0000 mg | ORAL_TABLET | Freq: Every day | ORAL | Status: AC

## 2015-03-28 DIAGNOSIS — I712 Thoracic aortic aneurysm, without rupture: Secondary | ICD-10-CM | POA: Diagnosis not present

## 2015-03-28 LAB — CREATININE, SERUM: Creat: 0.9 mg/dL (ref 0.50–1.10)

## 2015-03-28 LAB — BUN: BUN: 30 mg/dL — ABNORMAL HIGH (ref 6–23)

## 2015-03-31 DIAGNOSIS — H31003 Unspecified chorioretinal scars, bilateral: Secondary | ICD-10-CM | POA: Diagnosis not present

## 2015-03-31 DIAGNOSIS — Z961 Presence of intraocular lens: Secondary | ICD-10-CM | POA: Diagnosis not present

## 2015-03-31 DIAGNOSIS — H02831 Dermatochalasis of right upper eyelid: Secondary | ICD-10-CM | POA: Diagnosis not present

## 2015-04-05 ENCOUNTER — Other Ambulatory Visit: Payer: Self-pay | Admitting: Cardiothoracic Surgery

## 2015-04-06 ENCOUNTER — Encounter (HOSPITAL_COMMUNITY): Payer: Self-pay

## 2015-04-06 ENCOUNTER — Other Ambulatory Visit: Payer: Medicare Other

## 2015-04-06 ENCOUNTER — Ambulatory Visit (HOSPITAL_COMMUNITY)
Admission: RE | Admit: 2015-04-06 | Discharge: 2015-04-06 | Disposition: A | Discharge status: Home / Self Care | Payer: Medicare Other | Source: Ambulatory Visit | Attending: Cardiothoracic Surgery | Admitting: Cardiothoracic Surgery

## 2015-04-06 ENCOUNTER — Ambulatory Visit (INDEPENDENT_AMBULATORY_CARE_PROVIDER_SITE_OTHER): Payer: Medicare Other | Admitting: Cardiothoracic Surgery

## 2015-04-06 ENCOUNTER — Encounter: Payer: Self-pay | Admitting: Cardiothoracic Surgery

## 2015-04-06 VITALS — BP 129/86 | HR 100 | Resp 20 | Ht 60.0 in | Wt 129.0 lb

## 2015-04-06 DIAGNOSIS — K449 Diaphragmatic hernia without obstruction or gangrene: Secondary | ICD-10-CM | POA: Diagnosis not present

## 2015-04-06 DIAGNOSIS — Z853 Personal history of malignant neoplasm of breast: Secondary | ICD-10-CM | POA: Diagnosis not present

## 2015-04-06 DIAGNOSIS — I712 Thoracic aortic aneurysm, without rupture, unspecified: Secondary | ICD-10-CM

## 2015-04-06 DIAGNOSIS — M4856XA Collapsed vertebra, not elsewhere classified, lumbar region, initial encounter for fracture: Secondary | ICD-10-CM | POA: Insufficient documentation

## 2015-04-06 DIAGNOSIS — I2510 Atherosclerotic heart disease of native coronary artery without angina pectoris: Secondary | ICD-10-CM | POA: Diagnosis not present

## 2015-04-06 DIAGNOSIS — Z923 Personal history of irradiation: Secondary | ICD-10-CM | POA: Diagnosis not present

## 2015-04-06 DIAGNOSIS — I700 Atherosclerosis of aorta: Secondary | ICD-10-CM | POA: Diagnosis not present

## 2015-04-06 DIAGNOSIS — I7121 Aneurysm of the ascending aorta, without rupture: Secondary | ICD-10-CM

## 2015-04-06 DIAGNOSIS — I517 Cardiomegaly: Secondary | ICD-10-CM | POA: Diagnosis not present

## 2015-04-06 DIAGNOSIS — M40204 Unspecified kyphosis, thoracic region: Secondary | ICD-10-CM | POA: Insufficient documentation

## 2015-04-06 MED ORDER — IOHEXOL 350 MG/ML SOLN
80.0000 mL | Freq: Once | INTRAVENOUS | Status: AC | PRN
  Administered 2015-04-06: 80 mL via INTRAVENOUS

## 2015-04-06 NOTE — Progress Notes (Signed)
PCP is [name], MD Referring Provider is [name], MD  Chief Complaint  Patient presents with  . Thoracic Aortic Aneurysm    1 year f/u with Chest CT    HPI: One year followup with CT scan of the fusiform aneurysm ascending thoracic aorta measuring 4.4 cm and stable since 2013 She has no symptoms of chest pain. CT scan today shows no change in the moderate ascending fusiform aneurysm-4.4 cm diameter without hematoma or ulceration.  Past Medical History  Diagnosis Date  . GERD (gastroesophageal reflux disease)   . Hypercholesteremia   . Osteoporosis   . Thyroid disease   . Rib fractures   . Fracture lumbar vertebra-closed   . Fracture of thoracic vertebra, closed   . History of blood clots   . Cataract 3 and 10/92    bilateral  . Hypertension     DR Orinda Kenner  . Aneurysm   . Osteoporosis   . DVT (deep venous thrombosis) 08/2011    LL extremity   . History of radiation therapy 01/2012    R breast  . Hypothyroidism   . Anxiety   . Shortness of breath   . H/O hiatal hernia   . Cancer 10/12    bx/right breast  . 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 Fibromyalgia     Past Surgical History  Procedure Laterality Date  . Hernia repair  10/16/2006    [facility] - Dr Hassell Done  . Appendectomy  1940  . Tonsillectomy  1944  . Cataract extraction  1992  . Eye surgery  1940, 1956  . Mastectomy, partial  10/17/2011    Procedure: MASTECTOMY PARTIAL;  Surgeon: Haywood Lasso, MD;  Location: [facility];  Service: General;  Laterality: Right;  needle guided  . Breast surgery    . Kyphoplasty N/A 01/26/2013    Procedure: KYPHOPLASTY;  Surgeon: Kristeen Miss, MD;  Location: [facility];  Service: Neurosurgery;  Laterality: N/A;  T11 and L1    Family History  Problem Relation Age of Onset  . Heart disease Mother   . Heart disease Maternal Uncle   . Heart disease Maternal Grandfather     Social History History  Substance Use Topics  . Smoking status:  Never Smoker   . Smokeless tobacco: Never Used  . Alcohol Use: No    Current Outpatient Prescriptions  Medication Sig Dispense Refill  . amitriptyline (ELAVIL) 50 MG tablet Take 25-50 mg by mouth at bedtime.     Marland Kitchen amLODipine (NORVASC) 5 MG tablet Take 1 tablet (5 mg total) by mouth daily. 30 tablet 11  . aspirin 81 MG tablet Take 81 mg by mouth daily.    Marland Kitchen BONIVA 150 MG tablet Take 150 mg by mouth every 30 (thirty) days.     . calcium citrate-vitamin D (CITRACAL+D) 315-200 MG-UNIT per tablet Take 1 tablet by mouth 2 (two) times daily. 60 tablet 11  . cholecalciferol (VITAMIN D) 1000 UNITS tablet Take 1,000 Units by mouth daily. Vitamin D3    . HYDROcodone-acetaminophen (NORCO/VICODIN) 5-325 MG per tablet Take 1 tablet by mouth every 8 (eight) hours as needed.    Marland Kitchen levothyroxine (SYNTHROID, LEVOTHROID) 112 MCG tablet Take 112 mcg by mouth daily.    . metoprolol succinate (TOPROL-XL) 25 MG 24 hr tablet TAKE 1 TABLET EACH DAY. 30 tablet 11  . Multiple Vitamin (MULTIVITAMIN) tablet Take 1 tablet by mouth daily.     . polyethylene glycol (MIRALAX /  GLYCOLAX) packet Take 17 g by mouth daily. 14 each 3  . pravastatin (PRAVACHOL) 20 MG tablet Take 20 mg by mouth every evening.     Marland Kitchen PRILOSEC OTC 20 MG tablet Take 20 mg by mouth daily.     . solifenacin (VESICARE) 5 MG tablet Take 5 mg by mouth daily.     No current facility-administered medications for this visit.    Allergies  Allergen Reactions  . Contrast Media [Iodinated Diagnostic Agents] Shortness Of Breath  . Iohexol Shortness Of Breath  . Penicillins Rash    Review of Systems   Since her last visit the patient's husband has gone into a skilled nursing facility for neuropathy and difficulty walking related to spine disease. She is very concerned and.   Review of Systems  General:  No weight loss   no fever   no decreased energy  no night sweats Cardiac: - Chest pain with exertion - resting chest pain  - SOB with exertion    -Orthopnea                  -PND  ankle edema  -syncope Pulmonary:  no dyspnea,no cough, no productive cough no home oxygen no hemoptysis GI: no difficulty swallowing  no GERD no jaundice  no melena  no hematemesis no        abdominal pain GU:  no dysuria  no hematuria  no frequent UTI no BPH Vascular:  no claudication  No TIA  No varicose veins no DVT Neuro:  no sroke no seizures no TIA no head trauma no vision changes Musculoskeletal:  normal mobility no arthritis  no gout  no joint swelling Skin: no rash  no skin ulceration  no skin cancer Endocrine: diabetes  no thyroid didease Hematologic: no easy bruising  no blood transfusions  no frequent epistaxis ENT : no painful teeth no dentures no loose teeth Psych : no anxiety  no depression  o psych hospitalizations        BP 129/86 mmHg  Pulse 100  Resp 20  Ht 5' (1.524 m)  Wt 129 lb (58.514 kg)  BMI 25.19 kg/m2  SpO2 96% Physical Exam     Physical Exam  General: small 79 year old female with severe spinal kyphosis no acute distress HEENT: Normocephalic pupils equal , dentition adequate Neck: Supple without JVD, adenopathy, or bruit Chest: Clear to auscultation, symmetrical breath sounds, no rhonchi, no tenderness             or deformity Cardiovascular: patient has  irrregular rhythm, consistent with atrial fibrillation no murmur, no gallop, peripheral pulses             palpable in all extremities Abdomen:  Soft, nontender, no palpable mass or organomegaly Extremities: Warm, well-perfused, no clubbing cyanosis edema or tenderness,              no venous stasis changes of the legs Rectal/GU: Deferred Neuro: Grossly non--focal and symmetrical throughout Skin: Clean and dry without rash or ulceration   Diagnostic Tests: CT scan of chest images personally reviewed showing no change in her moderate fusiform ascending aneurysm. It is asymptomatic. He has low risk of  Aortic dissection at its current size.best therapy is  continued good blood pressure control, statin therapy.  Impression: Stable fusiform aneurysm for over three years we will continue to observe with serial scans but increase the interval between scans out to 18 months  Plan: Return 18 months with CT scan of chest to  assess ascending aorta  Len Childs, MD [facility] [phone]

## 2015-04-19 ENCOUNTER — Telehealth: Payer: Self-pay | Admitting: Neurology

## 2015-04-19 NOTE — Telephone Encounter (Signed)
Pt called and wanted to speak to you/Dawn CB# [phone]

## 2015-04-22 NOTE — Telephone Encounter (Signed)
Have you contacted this patient? Documentation needed and encounter can be closed / Sherri S.

## 2015-04-27 DIAGNOSIS — N814 Uterovaginal prolapse, unspecified: Secondary | ICD-10-CM | POA: Diagnosis not present

## 2015-04-27 NOTE — Telephone Encounter (Signed)
Has pt been contacted? Can encounter be closed? / Sherri

## 2015-04-27 NOTE — Telephone Encounter (Signed)
This is not my patient.  We see her husband and yes I have talked to her and documented it in husband's chart.

## 2015-05-10 DIAGNOSIS — D2239 Melanocytic nevi of other parts of face: Secondary | ICD-10-CM | POA: Diagnosis not present

## 2015-05-10 DIAGNOSIS — I8312 Varicose veins of left lower extremity with inflammation: Secondary | ICD-10-CM | POA: Diagnosis not present

## 2015-05-10 DIAGNOSIS — I872 Venous insufficiency (chronic) (peripheral): Secondary | ICD-10-CM | POA: Diagnosis not present

## 2015-05-10 DIAGNOSIS — Z85828 Personal history of other malignant neoplasm of skin: Secondary | ICD-10-CM | POA: Diagnosis not present

## 2015-05-10 DIAGNOSIS — I8311 Varicose veins of right lower extremity with inflammation: Secondary | ICD-10-CM | POA: Diagnosis not present

## 2015-05-13 DIAGNOSIS — M549 Dorsalgia, unspecified: Secondary | ICD-10-CM | POA: Diagnosis not present

## 2015-05-31 DIAGNOSIS — N814 Uterovaginal prolapse, unspecified: Secondary | ICD-10-CM | POA: Diagnosis not present

## 2015-06-17 DIAGNOSIS — N814 Uterovaginal prolapse, unspecified: Secondary | ICD-10-CM | POA: Diagnosis not present

## 2015-07-06 DIAGNOSIS — N814 Uterovaginal prolapse, unspecified: Secondary | ICD-10-CM | POA: Diagnosis not present

## 2015-07-19 ENCOUNTER — Telehealth: Payer: Self-pay | Admitting: Oncology

## 2015-07-19 NOTE — Telephone Encounter (Signed)
Returned voicemal. Patient confirmed appointment for October

## 2015-08-08 ENCOUNTER — Telehealth: Payer: Self-pay | Admitting: Oncology

## 2015-08-08 NOTE — Telephone Encounter (Signed)
Left message to confirm appointment change due to MD leaving early. Moved on 10/05 to [facility] per MD. Mailed calednar

## 2015-08-16 ENCOUNTER — Telehealth: Payer: Self-pay | Admitting: Nurse Practitioner

## 2015-08-16 DIAGNOSIS — I499 Cardiac arrhythmia, unspecified: Secondary | ICD-10-CM | POA: Diagnosis not present

## 2015-08-16 DIAGNOSIS — Z23 Encounter for immunization: Secondary | ICD-10-CM | POA: Diagnosis not present

## 2015-08-16 DIAGNOSIS — J069 Acute upper respiratory infection, unspecified: Secondary | ICD-10-CM | POA: Diagnosis not present

## 2015-08-16 NOTE — Telephone Encounter (Signed)
pt cld to cx aptt stating sh was sick and has to see another Dr PCP. no r/s at this time

## 2015-08-17 ENCOUNTER — Ambulatory Visit: Payer: Medicare Other | Admitting: Nurse Practitioner

## 2015-08-17 ENCOUNTER — Ambulatory Visit: Payer: Medicare Other | Admitting: Oncology

## 2015-09-04 ENCOUNTER — Encounter: Payer: Self-pay | Admitting: Internal Medicine

## 2015-09-04 ENCOUNTER — Encounter (HOSPITAL_COMMUNITY): Payer: Self-pay | Admitting: Emergency Medicine

## 2015-09-04 ENCOUNTER — Emergency Department (HOSPITAL_COMMUNITY): Payer: Medicare Other

## 2015-09-04 ENCOUNTER — Inpatient Hospital Stay (HOSPITAL_COMMUNITY)
Admission: EM | Admit: 2015-09-04 | Discharge: 2015-09-12 | DRG: 983 | Disposition: A | Discharge status: Home Health | Payer: Medicare Other | Attending: Neurosurgery | Admitting: Neurosurgery

## 2015-09-04 DIAGNOSIS — E039 Hypothyroidism, unspecified: Secondary | ICD-10-CM | POA: Diagnosis present

## 2015-09-04 DIAGNOSIS — M797 Fibromyalgia: Secondary | ICD-10-CM | POA: Diagnosis present

## 2015-09-04 DIAGNOSIS — I712 Thoracic aortic aneurysm, without rupture: Secondary | ICD-10-CM | POA: Diagnosis present

## 2015-09-04 DIAGNOSIS — M25512 Pain in left shoulder: Secondary | ICD-10-CM | POA: Diagnosis not present

## 2015-09-04 DIAGNOSIS — Z8249 Family history of ischemic heart disease and other diseases of the circulatory system: Secondary | ICD-10-CM

## 2015-09-04 DIAGNOSIS — R05 Cough: Secondary | ICD-10-CM

## 2015-09-04 DIAGNOSIS — M546 Pain in thoracic spine: Secondary | ICD-10-CM

## 2015-09-04 DIAGNOSIS — K648 Other hemorrhoids: Secondary | ICD-10-CM | POA: Diagnosis not present

## 2015-09-04 DIAGNOSIS — E7800 Pure hypercholesterolemia, unspecified: Secondary | ICD-10-CM | POA: Diagnosis present

## 2015-09-04 DIAGNOSIS — M5013 Cervical disc disorder with radiculopathy, cervicothoracic region: Secondary | ICD-10-CM | POA: Diagnosis present

## 2015-09-04 DIAGNOSIS — Z86718 Personal history of other venous thrombosis and embolism: Secondary | ICD-10-CM

## 2015-09-04 DIAGNOSIS — K219 Gastro-esophageal reflux disease without esophagitis: Secondary | ICD-10-CM | POA: Diagnosis present

## 2015-09-04 DIAGNOSIS — Z7982 Long term (current) use of aspirin: Secondary | ICD-10-CM

## 2015-09-04 DIAGNOSIS — M5023 Other cervical disc displacement, cervicothoracic region: Secondary | ICD-10-CM | POA: Diagnosis not present

## 2015-09-04 DIAGNOSIS — M5412 Radiculopathy, cervical region: Secondary | ICD-10-CM | POA: Diagnosis not present

## 2015-09-04 DIAGNOSIS — J189 Pneumonia, unspecified organism: Principal | ICD-10-CM | POA: Diagnosis present

## 2015-09-04 DIAGNOSIS — Z853 Personal history of malignant neoplasm of breast: Secondary | ICD-10-CM

## 2015-09-04 DIAGNOSIS — D509 Iron deficiency anemia, unspecified: Secondary | ICD-10-CM | POA: Diagnosis present

## 2015-09-04 DIAGNOSIS — Z79899 Other long term (current) drug therapy: Secondary | ICD-10-CM | POA: Diagnosis not present

## 2015-09-04 DIAGNOSIS — Z923 Personal history of irradiation: Secondary | ICD-10-CM | POA: Diagnosis not present

## 2015-09-04 DIAGNOSIS — M549 Dorsalgia, unspecified: Secondary | ICD-10-CM | POA: Diagnosis not present

## 2015-09-04 DIAGNOSIS — M810 Age-related osteoporosis without current pathological fracture: Secondary | ICD-10-CM | POA: Diagnosis present

## 2015-09-04 DIAGNOSIS — M419 Scoliosis, unspecified: Secondary | ICD-10-CM | POA: Diagnosis present

## 2015-09-04 DIAGNOSIS — I714 Abdominal aortic aneurysm, without rupture: Secondary | ICD-10-CM | POA: Diagnosis not present

## 2015-09-04 DIAGNOSIS — Z01818 Encounter for other preprocedural examination: Secondary | ICD-10-CM | POA: Diagnosis not present

## 2015-09-04 DIAGNOSIS — M4722 Other spondylosis with radiculopathy, cervical region: Secondary | ICD-10-CM | POA: Diagnosis present

## 2015-09-04 DIAGNOSIS — M5020 Other cervical disc displacement, unspecified cervical region: Secondary | ICD-10-CM | POA: Diagnosis present

## 2015-09-04 DIAGNOSIS — M4322 Fusion of spine, cervical region: Secondary | ICD-10-CM | POA: Diagnosis not present

## 2015-09-04 DIAGNOSIS — M4802 Spinal stenosis, cervical region: Secondary | ICD-10-CM | POA: Diagnosis not present

## 2015-09-04 DIAGNOSIS — Z419 Encounter for procedure for purposes other than remedying health state, unspecified: Secondary | ICD-10-CM

## 2015-09-04 DIAGNOSIS — K5900 Constipation, unspecified: Secondary | ICD-10-CM | POA: Diagnosis not present

## 2015-09-04 DIAGNOSIS — I10 Essential (primary) hypertension: Secondary | ICD-10-CM | POA: Diagnosis present

## 2015-09-04 LAB — I-STAT TROPONIN, ED: TROPONIN I, POC: 0 ng/mL (ref 0.00–0.08)

## 2015-09-04 LAB — I-STAT CHEM 8, ED
BUN: 30 mg/dL — ABNORMAL HIGH (ref 6–20)
CALCIUM ION: 1.16 mmol/L (ref 1.13–1.30)
Chloride: 99 mmol/L — ABNORMAL LOW (ref 101–111)
Creatinine, Ser: 0.6 mg/dL (ref 0.44–1.00)
GLUCOSE: 129 mg/dL — AB (ref 65–99)
HCT: 39 % (ref 36.0–46.0)
HEMOGLOBIN: 13.3 g/dL (ref 12.0–15.0)
Potassium: 3.7 mmol/L (ref 3.5–5.1)
SODIUM: 134 mmol/L — AB (ref 135–145)
TCO2: 24 mmol/L (ref 0–100)

## 2015-09-04 LAB — CBC WITH DIFFERENTIAL/PLATELET
BASOS PCT: 0 %
Basophils Absolute: 0 10*3/uL (ref 0.0–0.1)
EOS ABS: 0 10*3/uL (ref 0.0–0.7)
EOS PCT: 0 %
HCT: 33.5 % — ABNORMAL LOW (ref 36.0–46.0)
Hemoglobin: 10.8 g/dL — ABNORMAL LOW (ref 12.0–15.0)
Lymphocytes Relative: 7 %
Lymphs Abs: 0.6 10*3/uL — ABNORMAL LOW (ref 0.7–4.0)
MCH: 22 pg — AB (ref 26.0–34.0)
MCHC: 32.2 g/dL (ref 30.0–36.0)
MCV: 68.4 fL — AB (ref 78.0–100.0)
MONO ABS: 0.5 10*3/uL (ref 0.1–1.0)
Monocytes Relative: 6 %
NEUTROS ABS: 7.8 10*3/uL — AB (ref 1.7–7.7)
NEUTROS PCT: 87 %
PLATELETS: 378 10*3/uL (ref 150–400)
RBC: 4.9 MIL/uL (ref 3.87–5.11)
RDW: 19 % — ABNORMAL HIGH (ref 11.5–15.5)
WBC: 8.9 10*3/uL (ref 4.0–10.5)

## 2015-09-04 LAB — COMPREHENSIVE METABOLIC PANEL
ALT: 21 U/L (ref 14–54)
ANION GAP: 8 (ref 5–15)
AST: 27 U/L (ref 15–41)
Albumin: 3.2 g/dL — ABNORMAL LOW (ref 3.5–5.0)
Alkaline Phosphatase: 54 U/L (ref 38–126)
BUN: 27 mg/dL — ABNORMAL HIGH (ref 6–20)
CALCIUM: 8.9 mg/dL (ref 8.9–10.3)
CO2: 26 mmol/L (ref 22–32)
Chloride: 98 mmol/L — ABNORMAL LOW (ref 101–111)
Creatinine, Ser: 0.59 mg/dL (ref 0.44–1.00)
Glucose, Bld: 132 mg/dL — ABNORMAL HIGH (ref 65–99)
Potassium: 3.7 mmol/L (ref 3.5–5.1)
Sodium: 132 mmol/L — ABNORMAL LOW (ref 135–145)
Total Bilirubin: 0.5 mg/dL (ref 0.3–1.2)
Total Protein: 6.4 g/dL — ABNORMAL LOW (ref 6.5–8.1)

## 2015-09-04 LAB — CREATININE, SERUM
Creatinine, Ser: 0.57 mg/dL (ref 0.44–1.00)
GFR calc Af Amer: >60 mL/min (ref >60)
GFR calc non Af Amer: >60 mL/min (ref >60)

## 2015-09-04 LAB — URINALYSIS, ROUTINE W REFLEX MICROSCOPIC
Bilirubin Urine: NEGATIVE
GLUCOSE, UA: NEGATIVE mg/dL
Hgb urine dipstick: NEGATIVE
Ketones, ur: 15 mg/dL — AB
LEUKOCYTES UA: NEGATIVE
Nitrite: NEGATIVE
PH: 7 (ref 5.0–8.0)
Protein, ur: NEGATIVE mg/dL
Specific Gravity, Urine: 1.036 — ABNORMAL HIGH (ref 1.005–1.030)
Urobilinogen, UA: 0.2 mg/dL (ref 0.0–1.0)

## 2015-09-04 LAB — TROPONIN I
Troponin I: <0.03 ng/mL (ref <0.031)
Troponin I: <0.03 ng/mL (ref <0.031)

## 2015-09-04 LAB — I-STAT CG4 LACTIC ACID, ED: LACTIC ACID, VENOUS: 1.57 mmol/L (ref 0.5–2.0)

## 2015-09-04 LAB — STREP PNEUMONIAE URINARY ANTIGEN: STREP PNEUMO URINARY ANTIGEN: NEGATIVE

## 2015-09-04 MED ORDER — CALCIUM CITRATE-VITAMIN D 500-400 MG-UNIT PO CHEW
0.5000 | CHEWABLE_TABLET | Freq: Two times a day (BID) | ORAL | Status: DC
  Filled 2015-09-04 (×2): qty 0.5

## 2015-09-04 MED ORDER — LEVOFLOXACIN IN D5W 750 MG/150ML IV SOLN
750.0000 mg | Freq: Once | INTRAVENOUS | Status: DC
Start: 2015-09-04 — End: 2015-09-04
  Administered 2015-09-04: 750 mg via INTRAVENOUS
  Filled 2015-09-04: qty 150

## 2015-09-04 MED ORDER — ENOXAPARIN SODIUM 40 MG/0.4ML ~~LOC~~ SOLN
40.0000 mg | SUBCUTANEOUS | Status: DC
  Administered 2015-09-04 – 2015-09-06 (×3): 40 mg via SUBCUTANEOUS
  Filled 2015-09-04 (×3): qty 0.4

## 2015-09-04 MED ORDER — LEVOFLOXACIN IN D5W 750 MG/150ML IV SOLN
750.0000 mg | INTRAVENOUS | Status: DC
  Filled 2015-09-04: qty 150

## 2015-09-04 MED ORDER — ASPIRIN 81 MG PO TABS: 81.0000 mg | ORAL_TABLET | Freq: Every day | ORAL | Status: DC

## 2015-09-04 MED ORDER — DARIFENACIN HYDROBROMIDE ER 7.5 MG PO TB24
7.5000 mg | ORAL_TABLET | Freq: Every day | ORAL | Status: DC
  Administered 2015-09-05 – 2015-09-12 (×7): 7.5 mg via ORAL
  Filled 2015-09-04 (×8): qty 1

## 2015-09-04 MED ORDER — ASPIRIN EC 81 MG PO TBEC
81.0000 mg | DELAYED_RELEASE_TABLET | Freq: Every day | ORAL | Status: DC
  Administered 2015-09-04 – 2015-09-11 (×7): 81 mg via ORAL
  Filled 2015-09-04 (×9): qty 1

## 2015-09-04 MED ORDER — CYCLOBENZAPRINE HCL 10 MG PO TABS
5.0000 mg | ORAL_TABLET | Freq: Once | ORAL | Status: AC
  Administered 2015-09-04: 5 mg via ORAL
  Filled 2015-09-04: qty 1

## 2015-09-04 MED ORDER — OMEPRAZOLE MAGNESIUM 20 MG PO TBEC
20.0000 mg | DELAYED_RELEASE_TABLET | Freq: Every day | ORAL | Status: DC
  Filled 2015-09-04 (×2): qty 1

## 2015-09-04 MED ORDER — AMLODIPINE BESYLATE 5 MG PO TABS
5.0000 mg | ORAL_TABLET | Freq: Every day | ORAL | Status: DC
  Administered 2015-09-05 – 2015-09-12 (×8): 5 mg via ORAL
  Filled 2015-09-04 (×8): qty 1

## 2015-09-04 MED ORDER — METHYLPREDNISOLONE SODIUM SUCC 125 MG IJ SOLR
125.0000 mg | Freq: Once | INTRAMUSCULAR | Status: DC
  Filled 2015-09-04: qty 2

## 2015-09-04 MED ORDER — CALCIUM CITRATE-VITAMIN D 315-200 MG-UNIT PO TABS
1.0000 | ORAL_TABLET | Freq: Two times a day (BID) | ORAL | Status: DC
  Filled 2015-09-04 (×2): qty 1

## 2015-09-04 MED ORDER — DIPHENHYDRAMINE HCL 50 MG/ML IJ SOLN
50.0000 mg | Freq: Once | INTRAMUSCULAR | Status: AC
Start: 2015-09-04 — End: 2015-09-04
  Administered 2015-09-04: 50 mg via INTRAVENOUS
  Filled 2015-09-04: qty 1

## 2015-09-04 MED ORDER — LIDOCAINE 5 % EX PTCH
1.0000 | MEDICATED_PATCH | CUTANEOUS | Status: AC
  Administered 2015-09-04 – 2015-09-07 (×4): 1 via TRANSDERMAL
  Filled 2015-09-04 (×4): qty 1

## 2015-09-04 MED ORDER — PRAVASTATIN SODIUM 20 MG PO TABS
20.0000 mg | ORAL_TABLET | Freq: Every evening | ORAL | Status: DC
  Administered 2015-09-04 – 2015-09-11 (×8): 20 mg via ORAL
  Filled 2015-09-04 (×8): qty 1

## 2015-09-04 MED ORDER — LEVOTHYROXINE SODIUM 112 MCG PO TABS
112.0000 ug | ORAL_TABLET | Freq: Every day | ORAL | Status: DC
  Administered 2015-09-05 – 2015-09-12 (×8): 112 ug via ORAL
  Filled 2015-09-04 (×8): qty 1

## 2015-09-04 MED ORDER — METOPROLOL SUCCINATE ER 25 MG PO TB24
25.0000 mg | ORAL_TABLET | Freq: Every day | ORAL | Status: DC
  Administered 2015-09-04 – 2015-09-08 (×5): 25 mg via ORAL
  Filled 2015-09-04 (×6): qty 1

## 2015-09-04 MED ORDER — ONE-DAILY MULTI VITAMINS PO TABS
1.0000 | ORAL_TABLET | Freq: Every day | ORAL | Status: DC
  Administered 2015-09-05 – 2015-09-08 (×4): 1 via ORAL
  Filled 2015-09-04 (×7): qty 1

## 2015-09-04 MED ORDER — SODIUM CHLORIDE 0.9 % IV SOLN
Freq: Once | INTRAVENOUS | Status: AC
  Administered 2015-09-04: 17:00:00 via INTRAVENOUS

## 2015-09-04 MED ORDER — OXYCODONE HCL 5 MG PO TABS
5.0000 mg | ORAL_TABLET | ORAL | Status: DC | PRN
  Administered 2015-09-04 – 2015-09-12 (×13): 5 mg via ORAL
  Filled 2015-09-04 (×14): qty 1

## 2015-09-04 MED ORDER — IOHEXOL 350 MG/ML SOLN
60.0000 mL | Freq: Once | INTRAVENOUS | Status: AC | PRN
  Administered 2015-09-04: 60 mL via INTRAVENOUS

## 2015-09-04 MED ORDER — IBANDRONATE SODIUM 150 MG PO TABS: 150.0000 mg | ORAL_TABLET | ORAL | Status: DC

## 2015-09-04 MED ORDER — MORPHINE SULFATE (PF) 4 MG/ML IV SOLN
4.0000 mg | Freq: Once | INTRAVENOUS | Status: AC
  Administered 2015-09-04: 4 mg via INTRAVENOUS
  Filled 2015-09-04: qty 1

## 2015-09-04 MED ORDER — MORPHINE SULFATE (PF) 2 MG/ML IV SOLN
2.0000 mg | INTRAVENOUS | Status: DC | PRN
  Administered 2015-09-05 – 2015-09-10 (×17): 2 mg via INTRAVENOUS
  Filled 2015-09-04 (×17): qty 1

## 2015-09-04 MED ORDER — HYDROCORTISONE NA SUCCINATE PF 250 MG IJ SOLR
200.0000 mg | Freq: Once | INTRAMUSCULAR | Status: AC
  Administered 2015-09-04: 200 mg via INTRAVENOUS
  Filled 2015-09-04: qty 200

## 2015-09-04 MED ORDER — POLYETHYLENE GLYCOL 3350 17 G PO PACK: 17.0000 g | PACK | Freq: Every day | ORAL | Status: DC | PRN

## 2015-09-04 MED ORDER — SODIUM CHLORIDE 0.9 % IV SOLN
INTRAVENOUS | Status: AC
  Administered 2015-09-04: 13:00:00 via INTRAVENOUS

## 2015-09-04 MED ORDER — VITAMIN D 1000 UNITS PO TABS
1000.0000 [IU] | ORAL_TABLET | Freq: Every day | ORAL | Status: DC
  Administered 2015-09-04 – 2015-09-12 (×8): 1000 [IU] via ORAL
  Filled 2015-09-04 (×9): qty 1

## 2015-09-04 NOTE — Progress Notes (Signed)
[name] [id number] Admission Data: 09/04/2015 5:46 PM Attending Provider: Debbe Odea, MD  VUY:[id number],YSHU Broadus John, MD Consults/ Treatment Team:    [name] is a 79 y.o. female patient admitted from ED awake, alert  & orientated  X 3,  Full Code, VSS - Blood pressure 127/81, pulse 86, temperature 97.8 F (36.6 C), temperature source Oral, resp. rate 19, height 5' (1.524 m), weight 47.9 kg (105 lb 9.6 oz), SpO2 100 %.,no c/o shortness of breath, no c/o chest pain, no distress noted. Tele # 15 placed and pt is currently running:normal sinus rhythm.   IV site WDL:  antecubital left, condition patent and no redness with a transparent dsg that's clean dry and intact.  Allergies:   Allergies  Allergen Reactions  . Contrast Media [Iodinated Diagnostic Agents] Shortness Of Breath  . Iohexol Shortness Of Breath  . Penicillins Rash    Has patient had a PCN reaction causing immediate rash, facial/tongue/throat swelling, SOB or lightheadedness with hypotension: No Has patient had a PCN reaction causing severe rash involving mucus membranes or skin necrosis: No Has patient had a PCN reaction that required hospitalization No Has patient had a PCN reaction occurring within the last 10 years: No If all of the above answers are "NO", then may proceed with Cephalosporin use.      Past Medical History  Diagnosis Date  . GERD (gastroesophageal reflux disease)   . Hypercholesteremia   . Osteoporosis   . Thyroid disease   . Rib fractures   . Fracture lumbar vertebra-closed ([place])   . Fracture of thoracic vertebra, closed ([place])   . History of blood clots   . Cataract 3 and 10/92    bilateral  . Hypertension     DR Orinda Kenner  . Aneurysm ([place])   . Osteoporosis   . DVT (deep venous thrombosis) ([facility]) 08/2011    LL extremity   . History of radiation therapy 01/2012    R breast  . Hypothyroidism   . Anxiety   . Shortness of breath   . H/O hiatal hernia   . Cancer ([place]) 10/12   bx/right breast  . Breast cancer ([facility]) 08/20/2011    R breast DCIS, ER/PR +  . Fibromyalgia     History:  obtained from chart review. Tobacco/alcohol: denied none  Pt orientation to unit, room and routine. Information packet given to patient/family and safety video watched.  Admission INP armband ID verified with patient/family, and in place. SR up x 2, fall risk assessment complete with Patient and family verbalizing understanding of risks associated with falls. Pt verbalizes an understanding of how to use the call bell and to call for help before getting out of bed.  Skin, clean-dry- intact without evidence of bruising, or skin tears.   No evidence of skin break down noted on exam.    Will cont to monitor and assist as needed.  Salley Slaughter, RN 09/04/2015 5:46 PM

## 2015-09-04 NOTE — ED Notes (Signed)
Pt from home with c/o left scapular pain x 2 days after starting use of a new pillow.  Denies CP, SOB, cough, fever, or any other complaints.  Hx of spinal fractures and AAA.  Pt in NAD, A&O.

## 2015-09-04 NOTE — Progress Notes (Signed)
Utilization review completed.  

## 2015-09-04 NOTE — Progress Notes (Signed)
Left shoulder lidocaine patch removed per patient request, she said it was making her pain worse and she "really would like it off now." pain pills given around 9pm, patient stating pain is very reasonable at this time

## 2015-09-04 NOTE — ED Provider Notes (Signed)
CSN: [id number]     Arrival date & time 09/04/15  4098 History   First MD Initiated Contact with Patient 09/04/15 [phone]     Chief Complaint  Patient presents with  . Shoulder Pain  . Back Pain     (Consider location/radiation/quality/duration/timing/severity/associated sxs/prior Treatment) The history is provided by the patient.  [name] is a 79 y.o. female hx of GERD, HTN, thoracic aneurysm 4 cm, here with left scapula pain. Patient states that she had left scapula pain for the last 2 days. States that the pain is constant and it is difficult to get comfortable. She did try a new pillow but went back to her old fell and didn't improve. She states that sometimes the pain does radiate down the left arm. He is definitely worse when she moves. Denies any chest pain or shortness of breath. Denies fever or cough. History of thoracic aneurysm as well as spinal fractures. Had previous neurosurgical intervention but the thoracic aneurysm has been getting annual CTs and has been watched by vascular surgery.     Past Medical History  Diagnosis Date  . GERD (gastroesophageal reflux disease)   . Hypercholesteremia   . Osteoporosis   . Thyroid disease   . Rib fractures   . Fracture lumbar vertebra-closed ([place])   . Fracture of thoracic vertebra, closed ([place])   . History of blood clots   . Cataract 3 and 10/92    bilateral  . Hypertension     DR Orinda Kenner  . Aneurysm ([place])   . Osteoporosis   . DVT (deep venous thrombosis) ([place]) 08/2011    LL extremity   . History of radiation therapy 01/2012    R breast  . Hypothyroidism   . Anxiety   . Shortness of breath   . H/O hiatal hernia   . Cancer ([place]) 10/12    bx/right breast  . Breast cancer ([place]) 08/20/2011    R breast DCIS, ER/PR +  . Fibromyalgia    Past Surgical History  Procedure Laterality Date  . Hernia repair  10/16/2006    [facility] - Dr Hassell Done  . Appendectomy  1940  . Tonsillectomy  1944  . Cataract extraction  1992  . Eye  surgery  1940, 1956  . Mastectomy, partial  10/17/2011    Procedure: MASTECTOMY PARTIAL;  Surgeon: Haywood Lasso, MD;  Location: [facility];  Service: General;  Laterality: Right;  needle guided  . Breast surgery    . Kyphoplasty N/A 01/26/2013    Procedure: KYPHOPLASTY;  Surgeon: Kristeen Miss, MD;  Location: [facility];  Service: Neurosurgery;  Laterality: N/A;  T11 and L1   Family History  Problem Relation Age of Onset  . Heart disease Mother   . Heart disease Maternal Uncle   . Heart disease Maternal Grandfather    Social History  Substance Use Topics  . Smoking status: Never Smoker   . Smokeless tobacco: Never Used  . Alcohol Use: No   OB History    No data available     Review of Systems  Musculoskeletal: Positive for back pain.  All other systems reviewed and are negative.     Allergies  Contrast media; Iohexol; and Penicillins  Home Medications   Prior to Admission medications   Medication Sig Start Date End Date Taking? Authorizing Provider  amLODipine (NORVASC) 5 MG tablet Take 1 tablet (5 mg total) by mouth daily. 01/28/13  Yes [name], MD  aspirin 81 MG tablet Take  81 mg by mouth daily.   Yes Historical Provider, MD  BONIVA 150 MG tablet Take 150 mg by mouth every 30 (thirty) days.  08/30/11  Yes Historical Provider, MD  calcium citrate-vitamin D (CITRACAL+D) 315-200 MG-UNIT per tablet Take 1 tablet by mouth 2 (two) times daily. 01/28/13  Yes [name], MD  cholecalciferol (VITAMIN D) 1000 UNITS tablet Take 1,000 Units by mouth daily. Vitamin D3   Yes Historical Provider, MD  levothyroxine (SYNTHROID, LEVOTHROID) 112 MCG tablet Take 112 mcg by mouth daily.   Yes Historical Provider, MD  metoprolol succinate (TOPROL-XL) 25 MG 24 hr tablet TAKE 1 TABLET EACH DAY. 01/18/14  Yes Ivin Poot, MD  Multiple Vitamin (MULTIVITAMIN) tablet Take 1 tablet by mouth daily.    Yes Historical Provider, MD  pravastatin (PRAVACHOL) 20 MG tablet Take 20 mg by mouth every  evening.  09/02/11  Yes Historical Provider, MD  PRILOSEC OTC 20 MG tablet Take 20 mg by mouth daily.  07/01/11  Yes Historical Provider, MD  solifenacin (VESICARE) 5 MG tablet Take 5 mg by mouth daily.   Yes Historical Provider, MD   BP 148/77 mmHg  Pulse 88  Temp(Src) 97.9 F (36.6 C) (Oral)  Resp 18  SpO2 98% Physical Exam  Constitutional: She is oriented to person, place, and time. She appears well-developed.  Uncomfortable   HENT:  Head: Normocephalic.  Eyes: Conjunctivae are normal. Pupils are equal, round, and reactive to light.  Neck: Normal range of motion. Neck supple.  Cardiovascular: Normal rate, regular rhythm and normal heart sounds.   Pulmonary/Chest: Effort normal and breath sounds normal. No respiratory distress. She has no wheezes. She has no rales.  Good peripheral pulses   Abdominal: Soft. Bowel sounds are normal. She exhibits no distension. There is no tenderness. There is no rebound.  Musculoskeletal:  Mild L scapula tenderness, mild paralumbar tenderness. No obvious deformity. Has scoliosis. Nl ROM L shoulder   Neurological: She is alert and oriented to person, place, and time.  Skin: Skin is warm and dry.  Psychiatric: She has a normal mood and affect. Her behavior is normal. Judgment and thought content normal.  Nursing note and vitals reviewed.   ED Course  Procedures (including critical care time) Labs Review Labs Reviewed  CBC WITH DIFFERENTIAL/PLATELET - Abnormal; Notable for the following:    Hemoglobin 10.8 (*)    HCT 33.5 (*)    MCV 68.4 (*)    MCH 22.0 (*)    RDW 19.0 (*)    Neutro Abs 7.8 (*)    Lymphs Abs 0.6 (*)    All other components within normal limits  COMPREHENSIVE METABOLIC PANEL - Abnormal; Notable for the following:    Sodium 132 (*)    Chloride 98 (*)    Glucose, Bld 132 (*)    BUN 27 (*)    Total Protein 6.4 (*)    Albumin 3.2 (*)    All other components within normal limits  I-STAT CHEM 8, ED - Abnormal; Notable for the  following:    Sodium 134 (*)    Chloride 99 (*)    BUN 30 (*)    Glucose, Bld 129 (*)    All other components within normal limits  CULTURE, BLOOD (ROUTINE X 2)  CULTURE, BLOOD (ROUTINE X 2)  I-STAT TROPOININ, ED  I-STAT CG4 LACTIC ACID, ED    Imaging Review Ct Angio Chest Pe W/cm &/or Wo Cm  09/04/2015  CLINICAL DATA:  Left scapular pain x  2 days, upper back pain, history of breast cancer status post right lumpectomy. EXAM: CT ANGIOGRAPHY CHEST WITH CONTRAST TECHNIQUE: Multidetector CT imaging of the chest was performed using the standard protocol during bolus administration of intravenous contrast. Multiplanar CT image reconstructions and MIPs were obtained to evaluate the vascular anatomy. CONTRAST:  3mL OMNIPAQUE IOHEXOL 350 MG/ML SOLN COMPARISON:  CTA chest dated 04/06/2015 FINDINGS: No evidence pulmonary embolism. Stable 4.3 cm ascending thoracic aortic aneurysm (series 5/image 66). Mediastinum/Nodes: The heart is normal in size. No pericardial effusion. Coronary atherosclerosis. Atherosclerotic calcifications of the aortic arch. Visualized thyroid is unremarkable. Lungs/Pleura: Multifocal patchy opacities in the bilateral lower lobes, new from May 2016, suspicious for multifocal pneumonia. Dominant opacity measures 1.5 x 3.9 cm in the superior segment left lower lobe (series 6/ image 25). No pleural effusion or pneumothorax. Upper abdomen: Visualized upper abdomen is notable for a moderate hiatal hernia/ intrathoracic stomach and vascular calcifications. Musculoskeletal: Multiple lower thoracolumbar compression fracture deformities with prior vertebral augmentation of two lower thoracic vertebral bodies. Refer to dedicated thoracic spine CT for full report. Review of the MIP images confirms the above findings. IMPRESSION: No evidence of pulmonary embolism. Stable 4.3 cm ascending thoracic aortic aneurysm. Multifocal patchy opacities in the bilateral lower lobes, new from May 2016, suspicious  for multifocal pneumonia. Electronically Signed   By: Julian Hy M.D.   On: 09/04/2015 10:24   Ct Thoracic Spine Wo Contrast  09/04/2015  CLINICAL DATA:  Upper back/left scapular pain EXAM: CT THORACIC SPINE WITHOUT CONTRAST TECHNIQUE: Multidetector CT imaging of the thoracic spine was performed without intravenous contrast administration. Multiplanar CT image reconstructions were also generated. COMPARISON:  Concurrent CTA chest dated 09/04/2015. Prior CTA chest dated 04/06/2015. FINDINGS: Refer to CTA chest report for pertinent vascular and pulmonary findings. Exaggerated thoracic kyphosis. Prior vertebral augmentation at T11 and L1, unchanged. Multilevel thoracolumbar compression fracture deformities, unchanged from 04/06/2015, including: --Mild compression fracture deformities at T8 and L2 --Moderate compression fracture deformities at L3 (included on CTA chest) --Moderate to severe compression fracture deformities at T9 and T12 Moderate multilevel degenerative changes. No retropulsion. No evidence of acute fracture or dislocation. Left scapula is intact. IMPRESSION: Multilevel thoracolumbar compression fracture deformities, unchanged from May 2016, as described above. Prior vertebral augmentation at T11 and L1. No evidence of acute fracture or dislocation. Left scapula is intact. Refer to CTA chest report for pertinent vascular and pulmonary findings. Electronically Signed   By: Julian Hy M.D.   On: 09/04/2015 10:36   Dg Chest Port 1 View  09/04/2015  CLINICAL DATA:  Left shoulder pain and back pain 2 days. EXAM: PORTABLE CHEST 1 VIEW COMPARISON:  01/20/2013 FINDINGS: Patient is rotated to the left. Lungs are adequately inflated without consolidation or effusion. Mild stable cardiomegaly. Calcified plaque is present over the thoracic aorta. Evidence of known moderate size hiatal hernia. Stable curvature of the thoracic spine convex right. Evidence of previous kyphoplasties over the mid to  lower thoracic spine. IMPRESSION: No acute cardiopulmonary disease. Mild stable cardiomegaly. Hiatal hernia. Electronically Signed   By: Marin Olp M.D.   On: 09/04/2015 08:16   I have personally reviewed and evaluated these images and lab results as part of my medical decision-making.   EKG Interpretation   Date/Time:  Sunday September 04 2015 07:57:43 EDT Ventricular Rate:  80 PR Interval:  169 QRS Duration: 91 QT Interval:  415 QTC Calculation: 479 R Axis:   -71 Text Interpretation:  Sinus rhythm Left anterior fascicular block Anterior  infarct, old No significant change since last tracing Confirmed by YAO   MD, DAVID ([id number]) on 09/04/2015 8:35:45 AM      MDM   Final diagnoses:  None   [name] is a 79 y.o. female here with back pain. Given hx of thoracic aneurysm, will need CT angio to r/o rupture. Also has spinal stenosis so will get CT lumbar spine. Neurovascular intact so will not need MRI. Also consider ACS.   10:42 AM CT showed stable aneurysm. Still has pain. Of note, she has dry cough for several weeks. Was in the hospital visiting her husband who was admitted for pneumonia. CT showed multi focal pneumonia. Will order lactate, cultures. Will give levaquin. Will admit.      Wandra Arthurs, MD 09/04/15 1100

## 2015-09-04 NOTE — H&P (Signed)
[facility] History and Physical  [name] GYJ:[id number] DOB: Apr 10, 1936 DOA: 09/04/2015   PCP: [name], MD  ED MD: Dr Darl Householder.    Chief Complaint: L upper back pain, cough.   HPI: [name] is a 79 y.o. female  with a past medical history significant for multilevel degenerative joint disease status post kyphoplasty, remote history of breast cancer, history of gastroesophageal reflux disease, remote history of DVT in the past. The patient's husband was recently hospitalized for pneumonia, he is now currently in a facility for rehabilitation.  Over the course of the past 3-4 days, the patient has had a cough. Initially it was productive and she felt congested. Especially since the last 24 hours she has had a dry cough. She has significant pain in her shoulder/back/scapular area on the left side. Her neighbor brought her in to the emergency department today. She lives by herself currently in [place].  The patient has a history of thoracic aneurysm. She is being observed by vascular surgery. The patient underwent workup in the emergency department with a CT scan of the chest showing stable thoracic aneurysm. Patient showed multilevel degenerative disease. CT scan of the chest also showed multilevel bilateral lower lobes infiltrates. Patient denies any fevers denies any chills denies any shortness of breath. Denies any nausea or vomiting. She denies any chest discomfort. She has had her flu shot earlier this year.  In the emergency department, she has received IV Levaquin, for mail grams of IV morphine, by mouth Flexeril. A hospitalist admission was requested for management of community-acquired pneumonia, acute on chronic pain secondary to her multilevel degenerative joint disease.    General: The patient denies anorexia, fever, weight loss Cardiac: Denies chest pain, syncope, palpitations, pedal edema  Respiratory: Denies cough, shortness of  breath, wheezing GI: Denies severe indigestion/heartburn, abdominal pain, nausea, vomiting, diarrhea and constipation GU: Denies hematuria, incontinence, dysuria  Musculoskeletal: has chronic pain in her back area Skin: Denies suspicious skin lesions Neurologic: Denies focal weakness or numbness, change in vision Psychiatry: Denies depression or anxiety. Hematologic: no easy bruising or bleeding  All other systems reviewed and found to be negative.  Past Medical History  Diagnosis Date  . GERD (gastroesophageal reflux disease)   . Hypercholesteremia   . Osteoporosis   . Thyroid disease   . Rib fractures   . Fracture lumbar vertebra-closed ([place])   . Fracture of thoracic vertebra, closed ([place])   . History of blood clots   . Cataract 3 and 10/92    bilateral  . Hypertension     DR Orinda Kenner  . Aneurysm ([place])   . Osteoporosis   . DVT (deep venous thrombosis) ([place]) 08/2011    LL extremity   . History of radiation therapy 01/2012    R breast  . Hypothyroidism   . Anxiety   . Shortness of breath   . H/O hiatal hernia   . Cancer ([place]) 10/12    bx/right breast  . Breast cancer ([place]) 08/20/2011    R breast DCIS, ER/PR +  . Fibromyalgia     Past Surgical History  Procedure Laterality Date  . Hernia repair  10/16/2006    [facility] - Dr Hassell Done  . Appendectomy  1940  . Tonsillectomy  1944  . Cataract extraction  1992  . Eye surgery  1940, 1956  . Mastectomy, partial  10/17/2011    Procedure: MASTECTOMY PARTIAL;  Surgeon: Haywood Lasso, MD;  Location: [facility];  Service: General;  Laterality: Right;  needle guided  . Breast surgery    . Kyphoplasty N/A 01/26/2013    Procedure: KYPHOPLASTY;  Surgeon: Kristeen Miss, MD;  Location: [facility];  Service: Neurosurgery;  Laterality: N/A;  T11 and L1    Social History:  Lives at home with her husband, she has no kids. Currently, her husband is in [facility] facility from a recent hospitalization for PNA. Patient's friend/ neighbor  accompanied her to the ED today.    Allergies  Allergen Reactions  . Contrast Media [Iodinated Diagnostic Agents] Shortness Of Breath  . Iohexol Shortness Of Breath  . Penicillins Rash    Has patient had a PCN reaction causing immediate rash, facial/tongue/throat swelling, SOB or lightheadedness with hypotension: No Has patient had a PCN reaction causing severe rash involving mucus membranes or skin necrosis: No Has patient had a PCN reaction that required hospitalization No Has patient had a PCN reaction occurring within the last 10 years: No If all of the above answers are "NO", then may proceed with Cephalosporin use.     Family history:  Heart disease in mother and maternal uncle.    Prior to Admission medications   Medication Sig Start Date End Date Taking? Authorizing Provider  amLODipine (NORVASC) 5 MG tablet Take 1 tablet (5 mg total) by mouth daily. 01/28/13  Yes [name], MD  aspirin 81 MG tablet Take 81 mg by mouth daily.   Yes Historical Provider, MD  BONIVA 150 MG tablet Take 150 mg by mouth every 30 (thirty) days.  08/30/11  Yes Historical Provider, MD  calcium citrate-vitamin D (CITRACAL+D) 315-200 MG-UNIT per tablet Take 1 tablet by mouth 2 (two) times daily. 01/28/13  Yes [name], MD  cholecalciferol (VITAMIN D) 1000 UNITS tablet Take 1,000 Units by mouth daily. Vitamin D3   Yes Historical Provider, MD  levothyroxine (SYNTHROID, LEVOTHROID) 112 MCG tablet Take 112 mcg by mouth daily.   Yes Historical Provider, MD  metoprolol succinate (TOPROL-XL) 25 MG 24 hr tablet TAKE 1 TABLET EACH DAY. 01/18/14  Yes Ivin Poot, MD  Multiple Vitamin (MULTIVITAMIN) tablet Take 1 tablet by mouth daily.    Yes Historical Provider, MD  pravastatin (PRAVACHOL) 20 MG tablet Take 20 mg by mouth every evening.  09/02/11  Yes Historical Provider, MD  PRILOSEC OTC 20 MG tablet Take 20 mg by mouth daily.  07/01/11  Yes Historical Provider, MD  solifenacin (VESICARE) 5 MG tablet Take 5  mg by mouth daily.   Yes Historical Provider, MD     Physical Exam: Filed Vitals:   09/04/15 1030 09/04/15 1045 09/04/15 1100 09/04/15 1130  BP: 144/83  133/79 122/75  Pulse:  87 86 89  Temp:      TempSrc:      Resp:  17 21 15   SpO2:  100% 97% 96%     General: mild acute distress due to pain in her L scapula area  HEENT: Normocephalic and Atraumatic, Mucous membranes pink                PERRLA; EOM intact; No scleral icterus,                 Nares: Patent, Oropharynx: Clear, Fair Dentition                 Neck: FROM, no cervical lymphadenopathy, thyromegaly, carotid bruit or JVD;  Breasts: deferred CHEST WALL: No tenderness  CHEST: coarse scattered rhonchi in both anterior and posterior lung  fields.  HEART: Regular rate and rhythm; no murmurs rubs or gallops  BACK: + for kyphosis in thoraco lumbar area; no CVA tenderness  GI: Positive Bowel Sounds, soft, non-tender; no masses, no organomegaly Rectal Exam: deferred MSK: No cyanosis, clubbing, or edema, patient with TED hose in both LE since her history of DVT in the past.  Genitalia: not examined  SKIN:  no rash or ulceration  CNS: Alert and Oriented x 4, Nonfocal exam, CN 2-12 intact  Labs on Admission:  Basic Metabolic Panel:  Recent Labs Lab 09/04/15 0805 09/04/15 0839  NA 132* 134*  K 3.7 3.7  CL 98* 99*  CO2 26  --   GLUCOSE 132* 129*  BUN 27* 30*  CREATININE 0.59 0.60  CALCIUM 8.9  --    Liver Function Tests:  Recent Labs Lab 09/04/15 0805  AST 27  ALT 21  ALKPHOS 54  BILITOT 0.5  PROT 6.4*  ALBUMIN 3.2*   No results for input(s): LIPASE, AMYLASE in the last 168 hours. No results for input(s): AMMONIA in the last 168 hours. CBC:  Recent Labs Lab 09/04/15 0805 09/04/15 0839  WBC 8.9  --   NEUTROABS 7.8*  --   HGB 10.8* 13.3  HCT 33.5* 39.0  MCV 68.4*  --   PLT 378  --    Cardiac Enzymes: No results for input(s): CKTOTAL, CKMB, CKMBINDEX, TROPONINI in the last 168 hours.  BNP (last 3  results) No results for input(s): BNP in the last 8760 hours.  ProBNP (last 3 results) No results for input(s): PROBNP in the last 8760 hours.  CBG: No results for input(s): GLUCAP in the last 168 hours.  Radiological Exams on Admission: Ct Angio Chest Pe W/cm &/or Wo Cm  09/04/2015  CLINICAL DATA:  Left scapular pain x 2 days, upper back pain, history of breast cancer status post right lumpectomy. EXAM: CT ANGIOGRAPHY CHEST WITH CONTRAST TECHNIQUE: Multidetector CT imaging of the chest was performed using the standard protocol during bolus administration of intravenous contrast. Multiplanar CT image reconstructions and MIPs were obtained to evaluate the vascular anatomy. CONTRAST:  67mL OMNIPAQUE IOHEXOL 350 MG/ML SOLN COMPARISON:  CTA chest dated 04/06/2015 FINDINGS: No evidence pulmonary embolism. Stable 4.3 cm ascending thoracic aortic aneurysm (series 5/image 66). Mediastinum/Nodes: The heart is normal in size. No pericardial effusion. Coronary atherosclerosis. Atherosclerotic calcifications of the aortic arch. Visualized thyroid is unremarkable. Lungs/Pleura: Multifocal patchy opacities in the bilateral lower lobes, new from May 2016, suspicious for multifocal pneumonia. Dominant opacity measures 1.5 x 3.9 cm in the superior segment left lower lobe (series 6/ image 25). No pleural effusion or pneumothorax. Upper abdomen: Visualized upper abdomen is notable for a moderate hiatal hernia/ intrathoracic stomach and vascular calcifications. Musculoskeletal: Multiple lower thoracolumbar compression fracture deformities with prior vertebral augmentation of two lower thoracic vertebral bodies. Refer to dedicated thoracic spine CT for full report. Review of the MIP images confirms the above findings. IMPRESSION: No evidence of pulmonary embolism. Stable 4.3 cm ascending thoracic aortic aneurysm. Multifocal patchy opacities in the bilateral lower lobes, new from May 2016, suspicious for multifocal pneumonia.  Electronically Signed   By: Julian Hy M.D.   On: 09/04/2015 10:24   Ct Thoracic Spine Wo Contrast  09/04/2015  CLINICAL DATA:  Upper back/left scapular pain EXAM: CT THORACIC SPINE WITHOUT CONTRAST TECHNIQUE: Multidetector CT imaging of the thoracic spine was performed without intravenous contrast administration. Multiplanar CT image reconstructions were also generated. COMPARISON:  Concurrent CTA chest dated 09/04/2015. Prior  CTA chest dated 04/06/2015. FINDINGS: Refer to CTA chest report for pertinent vascular and pulmonary findings. Exaggerated thoracic kyphosis. Prior vertebral augmentation at T11 and L1, unchanged. Multilevel thoracolumbar compression fracture deformities, unchanged from 04/06/2015, including: --Mild compression fracture deformities at T8 and L2 --Moderate compression fracture deformities at L3 (included on CTA chest) --Moderate to severe compression fracture deformities at T9 and T12 Moderate multilevel degenerative changes. No retropulsion. No evidence of acute fracture or dislocation. Left scapula is intact. IMPRESSION: Multilevel thoracolumbar compression fracture deformities, unchanged from May 2016, as described above. Prior vertebral augmentation at T11 and L1. No evidence of acute fracture or dislocation. Left scapula is intact. Refer to CTA chest report for pertinent vascular and pulmonary findings. Electronically Signed   By: Julian Hy M.D.   On: 09/04/2015 10:36   Dg Chest Port 1 View  09/04/2015  CLINICAL DATA:  Left shoulder pain and back pain 2 days. EXAM: PORTABLE CHEST 1 VIEW COMPARISON:  01/20/2013 FINDINGS: Patient is rotated to the left. Lungs are adequately inflated without consolidation or effusion. Mild stable cardiomegaly. Calcified plaque is present over the thoracic aorta. Evidence of known moderate size hiatal hernia. Stable curvature of the thoracic spine convex right. Evidence of previous kyphoplasties over the mid to lower thoracic spine.  IMPRESSION: No acute cardiopulmonary disease. Mild stable cardiomegaly. Hiatal hernia. Electronically Signed   By: Marin Olp M.D.   On: 09/04/2015 08:16    EKG: Independently reviewed. Yes, no acute ST T wave changes  Assessment/Plan Active Problems:   1. CAP (community acquired pneumonia)  Continue IV Levaquin, supplemental O2, NEB breathing treatments, monitor CBC.   2. Acute pain in the L scapular area, L upper arm area, history of multi level compression deformities:  Patient received IV Morphine in the ED with minimal relief, continue with IV Morphine PRN, patient takes Vicodin at home without relief, states she has tried Oxycodone which has made her too dizzy. Will add Lidoderm patch, add low dose PRN Oxycodone IR for pain control.     3. Remote history of LLE DVT not currently on any anti coagulation:  DVT prophylaxis with Lovenox.   Expected length of stay is 48 hours.   Consulted: none  Code Status: full code  Family Communication: Discussed with patient and neighbor who brought the patient to the hospital. The patient has no kids, her husband is in rehab at [facility] facility. DVT Prophylaxis: Lovenox SQ  Time spent: 90 minutes   Loistine Chance, MD [facility] [phone]  If 7PM-7AM, please contact night-coverage [email or web address] 09/04/2015, 12:09 PM

## 2015-09-05 ENCOUNTER — Inpatient Hospital Stay (HOSPITAL_COMMUNITY): Payer: Medicare Other

## 2015-09-05 DIAGNOSIS — J189 Pneumonia, unspecified organism: Principal | ICD-10-CM

## 2015-09-05 LAB — CBC
HCT: 28.9 % — ABNORMAL LOW (ref 36.0–46.0)
Hemoglobin: 9.3 g/dL — ABNORMAL LOW (ref 12.0–15.0)
MCH: 22 pg — AB (ref 26.0–34.0)
MCHC: 32.2 g/dL (ref 30.0–36.0)
MCV: 68.5 fL — AB (ref 78.0–100.0)
PLATELETS: 302 10*3/uL (ref 150–400)
RBC: 4.22 MIL/uL (ref 3.87–5.11)
RDW: 19 % — AB (ref 11.5–15.5)
WBC: 8.4 10*3/uL (ref 4.0–10.5)

## 2015-09-05 LAB — BASIC METABOLIC PANEL
Anion gap: 6 (ref 5–15)
BUN: 22 mg/dL — AB (ref 6–20)
CHLORIDE: 103 mmol/L (ref 101–111)
CO2: 25 mmol/L (ref 22–32)
CREATININE: 0.68 mg/dL (ref 0.44–1.00)
Calcium: 8.3 mg/dL — ABNORMAL LOW (ref 8.9–10.3)
GFR calc non Af Amer: >60 mL/min (ref >60)
Glucose, Bld: 96 mg/dL (ref 65–99)
Potassium: 3.8 mmol/L (ref 3.5–5.1)
SODIUM: 134 mmol/L — AB (ref 135–145)

## 2015-09-05 LAB — URINE CULTURE: Culture: NO GROWTH

## 2015-09-05 LAB — LEGIONELLA PNEUMOPHILA SEROGP 1 UR AG: L. PNEUMOPHILA SEROGP 1 UR AG: NEGATIVE

## 2015-09-05 LAB — TROPONIN I: Troponin I: <0.03 ng/mL (ref <0.031)

## 2015-09-05 MED ORDER — PNEUMOCOCCAL VAC POLYVALENT 25 MCG/0.5ML IJ INJ
0.5000 mL | INJECTION | INTRAMUSCULAR | Status: DC
  Filled 2015-09-05: qty 0.5

## 2015-09-05 MED ORDER — LEVOFLOXACIN IN D5W 750 MG/150ML IV SOLN
750.0000 mg | INTRAVENOUS | Status: DC
  Administered 2015-09-06: 750 mg via INTRAVENOUS
  Filled 2015-09-05: qty 150

## 2015-09-05 MED ORDER — INFLUENZA VAC SPLIT QUAD 0.5 ML IM SUSY
0.5000 mL | PREFILLED_SYRINGE | INTRAMUSCULAR | Status: DC
  Filled 2015-09-05: qty 0.5

## 2015-09-05 MED ORDER — ACETAMINOPHEN-CODEINE #3 300-30 MG PO TABS: 1.0000 | ORAL_TABLET | Freq: Three times a day (TID) | ORAL | Status: DC | PRN

## 2015-09-05 MED ORDER — OMEPRAZOLE 20 MG PO CPDR
20.0000 mg | DELAYED_RELEASE_CAPSULE | Freq: Every day | ORAL | Status: DC
  Administered 2015-09-05 – 2015-09-10 (×5): 20 mg via ORAL
  Filled 2015-09-05 (×6): qty 1

## 2015-09-05 NOTE — Progress Notes (Signed)
Patient did not consent to [employer] for telemedicine.

## 2015-09-05 NOTE — Evaluation (Signed)
Physical Therapy Evaluation Patient Details Name: [name] MRN: [id number] DOB: Oct 07, 1936 Today's Date: 09/05/2015   History of Present Illness  Patient is a 79 y/o female presents with left upper back pain and cough. CT scan of the chest showed multilevel bilateral lower lobes infiltrates. Neurosurgery following.  PMH includes multilevel DJD s/p kyphoplasty, breast cancer, DVT, thoracic aneurysm.  Clinical Impression  Patient presents with pain in back at rest, worsened with activity impacting safe mobility. Pt lives alone and was independent PTA. Pt now requires Min A for short distance ambulation for safety. Pt's biggest limiting factor is her pain. Once we can control her pain, mobility should improve. Discussed back precautions and body mechanics. Says she has a church friend that could sit with her for a few hours during the day if needed but no other family to assist. If pt's pain does not improve, pt will need ST SNF. Will follow acutely to maximize independence.     Follow Up Recommendations Home health PT;Supervision for mobility/OOB    Equipment Recommendations  None recommended by PT    Recommendations for Other Services OT consult     Precautions / Restrictions Precautions Precautions: Fall;Back Precaution Booklet Issued: No Restrictions Weight Bearing Restrictions: No      Mobility  Bed Mobility Overal bed mobility: Needs Assistance Bed Mobility: Rolling;Sidelying to Sit Rolling: Supervision Sidelying to sit: Supervision;HOB elevated       General bed mobility comments: Cues for log roll technique. Increased pain.  Transfers Overall transfer level: Needs assistance Equipment used: None Transfers: Sit to/from Stand Sit to Stand: Min guard         General transfer comment: Min guard for safety. Pt reaching for counter for support upon standing due to pain.  Ambulation/Gait Ambulation/Gait assistance: Min assist Ambulation Distance (Feet): 20  Feet (x2 bouts) Assistive device: None Gait Pattern/deviations: Step-through pattern;Decreased stride length;Trunk flexed   Gait velocity interpretation: <1.8 ft/sec, indicative of risk for recurrent falls General Gait Details: Kyphotic posturing with forward flexion; furniture walking for support. Leaning on counter for support for pain control.   Stairs            Wheelchair Mobility    Modified Rankin (Stroke Patients Only)       Balance Overall balance assessment: Needs assistance Sitting-balance support: Feet supported;Bilateral upper extremity supported Sitting balance-Leahy Scale: Fair     Standing balance support: During functional activity Standing balance-Leahy Scale: Poor Standing balance comment: Furniture walking within room for support and pain control.                             Pertinent Vitals/Pain Pain Assessment: 0-10 Pain Score: 8  Pain Location: left upper back Pain Descriptors / Indicators: Sharp;Sore;Aching Pain Intervention(s): Monitored during session;Repositioned;Limited activity within patient's tolerance;Premedicated before session    [place] expects to be discharged to:: Private residence Living Arrangements: Alone Available Help at Discharge: Available PRN/intermittently (church friends can sit for a few hours.) Type of Home: Apartment Home Access: Level entry     Home Layout: One level Home Equipment: Environmental consultant - 2 wheels;Cane - single point      Prior Function Level of Independence: Independent               Hand Dominance        Extremity/Trunk Assessment   Upper Extremity Assessment: Defer to OT evaluation (AROM LUE WFL however painful in shoulder elevation and abduction)  Lower Extremity Assessment: Generalized weakness      Cervical / Trunk Assessment: Kyphotic  Communication   Communication: No difficulties  Cognition Arousal/Alertness: Awake/alert Behavior  During Therapy: WFL for tasks assessed/performed Overall Cognitive Status: Within Functional Limits for tasks assessed                      General Comments General comments (skin integrity, edema, etc.): Pt's spouse living in [place] right now since last November. Pt refuses to go there.    Exercises        Assessment/Plan    PT Assessment Patient needs continued PT services  PT Diagnosis Difficulty walking;Acute pain   PT Problem List Pain;Decreased mobility;Decreased knowledge of precautions;Decreased balance;Decreased activity tolerance  PT Treatment Interventions Balance training;Gait training;Functional mobility training;Therapeutic activities;Patient/family education;Therapeutic exercise   PT Goals (Current goals can be found in the Care Plan section) Acute Rehab PT Goals Patient Stated Goal: to make this pain go away PT Goal Formulation: With patient Time For Goal Achievement: 09/19/15 Potential to Achieve Goals: Fair    Frequency Min 3X/week   Barriers to discharge Decreased caregiver support Lives alone    Co-evaluation               End of Session   Activity Tolerance: Patient limited by pain Patient left: in bed;with call bell/phone within reach;with bed alarm set Nurse Communication: Mobility status;Precautions         Time: 5625-6389 PT Time Calculation (min) (ACUTE ONLY): 21 min   Charges:   PT Evaluation $Initial PT Evaluation Tier I: 1 Procedure     PT G Codes:        Antar Milks A Dericka Ostenson 09/05/2015, 3:33 PM Wray Kearns, Odell, DPT [phone]

## 2015-09-05 NOTE — Care Management Note (Signed)
Case Management Note  Patient Details  Name: [name] MRN: [id number] Date of Birth: [birth year]  Subjective/Objective:                  Date-09-05-15 Initial Assessment  Spoke with patient at the bedside. 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   Verified patient anticipates to go home with spouse alone, husband is at [place] at time of discharge. Patient has DME cane walker. Expressed potential need for no other DME.  Patient denied   needing help with their medication.  Patient drives to MD appointments.  Verified patient has PCP. Patient states they currently receive [facility] services through no one.    Plan: CM will continue to follow for discharge planning and [facility] resources.   Carles Collet RN BSN CM [phone]   Action/Plan:   Expected Discharge Date:                  Expected Discharge Plan:  Home/Self Care  In-House Referral:     Discharge planning Services  CM Consult  Post Acute Care Choice:    Choice offered to:     DME Arranged:    DME Agency:     HH Arranged:    HH Agency:     Status of Service:  In process, will continue to follow  Medicare Important Message Given:    Date Medicare IM Given:    Medicare IM give by:    Date Additional Medicare IM Given:    Additional Medicare Important Message give by:     If discussed at [place] of Stay Meetings, dates discussed:    Additional Comments:  Carles Collet, RN 09/05/2015, 12:32 PM

## 2015-09-05 NOTE — Progress Notes (Addendum)
[facility] PROGRESS NOTE  [name] UMP:[id number] DOB: [birth year] DOA: 09/04/2015 PCP: [name], MD  Length of stay: 1   Assessment/Plan: Principal Problem:   CAP (community acquired pneumonia) Active Problems:   Rib fractures   Fracture lumbar vertebra-closed ([place])   History of DVT of lower extremity   Hypothyroidism   Community acquired pneumonia     1. CAP (community acquired pneumonia) Continue IV Levaquin, supplemental O2, NEB breathing treatments, monitor CBC.   2. Acute pain in the L scapular area, L upper arm area, history of multi level compression deformities with severe kyphoscoliosis: History of vertebroplasty of the T11- L1 level by Kristeen Miss, MD in  2014 Continue with IV Morphine PRN, patient takes Vicodin at home without relief, states she has tried Oxycodone which has made her too dizzy. Will add Lidoderm patch, add low dose PRN Oxycodone IR as well as Tylenol with Codeine for pain control. Consult Dr Donald Pore for recommendations    3. Remote history of LLE DVT not currently on any anti coagulation: DVT prophylaxis with Lovenox.    DVT prophylaxsis Lovenox  Code Status:      Code Status Orders        Start     Ordered   09/04/15 1154  Full code   Continuous     09/04/15 1159    Advance Directive Documentation        Most Recent Value   Type of Advance Directive  Healthcare Power of Attorney, Living will   Pre-existing out of facility DNR order (yellow form or pink MOST form)     "MOST" Form in Place?       Family Communication: family updated about patient's clinical progress Disposition Plan: 2-3 days, PT/OT evaluation   Brief narrative: 79 y.o. female with a past medical history significant for multilevel degenerative joint disease status post kyphoplasty, remote history of breast cancer, history of gastroesophageal reflux disease, remote history of DVT in the past. The  patient's husband was recently hospitalized for pneumonia, he is now currently in a facility for rehabilitation.  Over the course of the past 3-4 days, the patient has had a cough. Initially it was productive and she felt congested. Especially since the last 24 hours she has had a dry cough. She has significant pain in her shoulder/back/scapular area on the left side. Her neighbor brought her in to the emergency department today. She lives by herself currently [place].  The patient has a history of thoracic aneurysm. She is being observed by vascular surgery. The patient underwent workup in the emergency department with a CT scan of the chest showing stable thoracic aneurysm. Patient showed multilevel degenerative disease. CT scan of the chest also showed multilevel bilateral lower lobes infiltrates. Patient denies any fevers denies any chills denies any shortness of breath. Denies any nausea or vomiting. She denies any chest discomfort. She has had her flu shot earlier this year.  In the emergency department, she has received IV Levaquin, for mail grams of IV morphine, by mouth Flexeril. A hospitalist admission was requested for management of community-acquired pneumonia, acute on chronic pain secondary to her multilevel degenerative joint disease.  Consultants:  None  Procedures:  None  Antibiotics: Anti-infectives    Start     Dose/Rate Route Frequency Ordered Stop   09/06/15 1200  levofloxacin (LEVAQUIN) IVPB 750 mg     750 mg 100 mL/hr over 90 Minutes Intravenous Every 48  hours 09/05/15 0937     09/04/15 1200  levofloxacin (LEVAQUIN) IVPB 750 mg  Status:  Discontinued     750 mg 100 mL/hr over 90 Minutes Intravenous Every 24 hours 09/04/15 1159 09/05/15 0937   09/04/15 1045  levofloxacin (LEVAQUIN) IVPB 750 mg  Status:  Discontinued     750 mg 100 mL/hr over 90 Minutes Intravenous  Once 09/04/15 1043 09/04/15 1305         HPI/Subjective: Complaining  of pain radiating from her left paraspinal region to the left scapula  Objective: Filed Vitals:   09/04/15 1300 09/04/15 1429 09/04/15 2225 09/05/15 0507  BP: 117/70 127/81 119/63 126/73  Pulse: 91 86 84 72  Temp:  97.8 F (36.6 C) 97.8 F (36.6 C) 98 F (36.7 C)  TempSrc:  Oral Oral Oral  Resp: 19 19 18 18   Height:  5' (1.524 m)    Weight:  47.9 kg (105 lb 9.6 oz)    SpO2: 99% 100% 97% 95%    Intake/Output Summary (Last 24 hours) at 09/05/15 1255 Last data filed at 09/05/15 2297  Gross per 24 hour  Intake 1070.25 ml  Output      0 ml  Net 1070.25 ml    Exam:  General: No acute respiratory distress Lungs: Clear to auscultation bilaterally without wheezes or crackles Cardiovascular: Regular rate and rhythm without murmur gallop or rub normal S1 and S2 Abdomen: Nontender, nondistended, soft, bowel sounds positive, no rebound, no ascites, no appreciable mass Extremities: No significant cyanosis, clubbing, or edema bilateral lower extremities     Data Review   Micro Results Recent Results (from the past 240 hour(s))  Blood culture (routine x 2)     Status: None (Preliminary result)   Collection Time: 09/04/15 10:55 AM  Result Value Ref Range Status   Specimen Description BLOOD BLOOD LEFT FOREARM  Final   Special Requests BOTTLES DRAWN AEROBIC ONLY 6CC  Final   Culture PENDING  Incomplete   Report Status PENDING  Incomplete    Radiology Reports Ct Angio Chest Pe W/cm &/or Wo Cm  09/04/2015  CLINICAL DATA:  Left scapular pain x 2 days, upper back pain, history of breast cancer status post right lumpectomy. EXAM: CT ANGIOGRAPHY CHEST WITH CONTRAST TECHNIQUE: Multidetector CT imaging of the chest was performed using the standard protocol during bolus administration of intravenous contrast. Multiplanar CT image reconstructions and MIPs were obtained to evaluate the vascular anatomy. CONTRAST:  18mL OMNIPAQUE IOHEXOL 350 MG/ML SOLN COMPARISON:  CTA chest dated 04/06/2015  FINDINGS: No evidence pulmonary embolism. Stable 4.3 cm ascending thoracic aortic aneurysm (series 5/image 66). Mediastinum/Nodes: The heart is normal in size. No pericardial effusion. Coronary atherosclerosis. Atherosclerotic calcifications of the aortic arch. Visualized thyroid is unremarkable. Lungs/Pleura: Multifocal patchy opacities in the bilateral lower lobes, new from May 2016, suspicious for multifocal pneumonia. Dominant opacity measures 1.5 x 3.9 cm in the superior segment left lower lobe (series 6/ image 25). No pleural effusion or pneumothorax. Upper abdomen: Visualized upper abdomen is notable for a moderate hiatal hernia/ intrathoracic stomach and vascular calcifications. Musculoskeletal: Multiple lower thoracolumbar compression fracture deformities with prior vertebral augmentation of two lower thoracic vertebral bodies. Refer to dedicated thoracic spine CT for full report. Review of the MIP images confirms the above findings. IMPRESSION: No evidence of pulmonary embolism. Stable 4.3 cm ascending thoracic aortic aneurysm. Multifocal patchy opacities in the bilateral lower lobes, new from May 2016, suspicious for multifocal pneumonia. Electronically Signed   By: Julian Hy  M.D.   On: 09/04/2015 10:24   Ct Thoracic Spine Wo Contrast  09/04/2015  CLINICAL DATA:  Upper back/left scapular pain EXAM: CT THORACIC SPINE WITHOUT CONTRAST TECHNIQUE: Multidetector CT imaging of the thoracic spine was performed without intravenous contrast administration. Multiplanar CT image reconstructions were also generated. COMPARISON:  Concurrent CTA chest dated 09/04/2015. Prior CTA chest dated 04/06/2015. FINDINGS: Refer to CTA chest report for pertinent vascular and pulmonary findings. Exaggerated thoracic kyphosis. Prior vertebral augmentation at T11 and L1, unchanged. Multilevel thoracolumbar compression fracture deformities, unchanged from 04/06/2015, including: --Mild compression fracture deformities at  T8 and L2 --Moderate compression fracture deformities at L3 (included on CTA chest) --Moderate to severe compression fracture deformities at T9 and T12 Moderate multilevel degenerative changes. No retropulsion. No evidence of acute fracture or dislocation. Left scapula is intact. IMPRESSION: Multilevel thoracolumbar compression fracture deformities, unchanged from May 2016, as described above. Prior vertebral augmentation at T11 and L1. No evidence of acute fracture or dislocation. Left scapula is intact. Refer to CTA chest report for pertinent vascular and pulmonary findings. Electronically Signed   By: Julian Hy M.D.   On: 09/04/2015 10:36   Dg Chest Port 1 View  09/04/2015  CLINICAL DATA:  Left shoulder pain and back pain 2 days. EXAM: PORTABLE CHEST 1 VIEW COMPARISON:  01/20/2013 FINDINGS: Patient is rotated to the left. Lungs are adequately inflated without consolidation or effusion. Mild stable cardiomegaly. Calcified plaque is present over the thoracic aorta. Evidence of known moderate size hiatal hernia. Stable curvature of the thoracic spine convex right. Evidence of previous kyphoplasties over the mid to lower thoracic spine. IMPRESSION: No acute cardiopulmonary disease. Mild stable cardiomegaly. Hiatal hernia. Electronically Signed   By: Marin Olp M.D.   On: 09/04/2015 08:16     CBC  Recent Labs Lab 09/04/15 0805 09/04/15 0839 09/05/15 0010  WBC 8.9  --  8.4  HGB 10.8* 13.3 9.3*  HCT 33.5* 39.0 28.9*  PLT 378  --  302  MCV 68.4*  --  68.5*  MCH 22.0*  --  22.0*  MCHC 32.2  --  32.2  RDW 19.0*  --  19.0*  LYMPHSABS 0.6*  --   --   MONOABS 0.5  --   --   EOSABS 0.0  --   --   BASOSABS 0.0  --   --     Chemistries   Recent Labs Lab 09/04/15 0805 09/04/15 0839 09/04/15 1203 09/05/15 0010  NA 132* 134*  --  134*  K 3.7 3.7  --  3.8  CL 98* 99*  --  103  CO2 26  --   --  25  GLUCOSE 132* 129*  --  96  BUN 27* 30*  --  22*  CREATININE 0.59 0.60 0.57 0.68   CALCIUM 8.9  --   --  8.3*  AST 27  --   --   --   ALT 21  --   --   --   ALKPHOS 54  --   --   --   BILITOT 0.5  --   --   --    ------------------------------------------------------------------------------------------------------------------ estimated creatinine clearance is 42.3 mL/min (by C-G formula based on Cr of 0.68). ------------------------------------------------------------------------------------------------------------------ No results for input(s): HGBA1C in the last 72 hours. ------------------------------------------------------------------------------------------------------------------ No results for input(s): CHOL, HDL, LDLCALC, TRIG, CHOLHDL, LDLDIRECT in the last 72 hours. ------------------------------------------------------------------------------------------------------------------ No results for input(s): TSH, T4TOTAL, T3FREE, THYROIDAB in the last 72 hours.  Invalid input(s): FREET3 ------------------------------------------------------------------------------------------------------------------ No  results for input(s): VITAMINB12, FOLATE, FERRITIN, TIBC, IRON, RETICCTPCT in the last 72 hours.  Coagulation profile No results for input(s): INR, PROTIME in the last 168 hours.  No results for input(s): DDIMER in the last 72 hours.  Cardiac Enzymes  Recent Labs Lab 09/04/15 1203 09/04/15 1806 09/05/15 0010  TROPONINI <0.03 <0.03 <0.03   ------------------------------------------------------------------------------------------------------------------ Invalid input(s): POCBNP   CBG: No results for input(s): GLUCAP in the last 168 hours.     Studies: Ct Angio Chest Pe W/cm &/or Wo Cm  09/04/2015  CLINICAL DATA:  Left scapular pain x 2 days, upper back pain, history of breast cancer status post right lumpectomy. EXAM: CT ANGIOGRAPHY CHEST WITH CONTRAST TECHNIQUE: Multidetector CT imaging of the chest was performed using the standard protocol during  bolus administration of intravenous contrast. Multiplanar CT image reconstructions and MIPs were obtained to evaluate the vascular anatomy. CONTRAST:  74mL OMNIPAQUE IOHEXOL 350 MG/ML SOLN COMPARISON:  CTA chest dated 04/06/2015 FINDINGS: No evidence pulmonary embolism. Stable 4.3 cm ascending thoracic aortic aneurysm (series 5/image 66). Mediastinum/Nodes: The heart is normal in size. No pericardial effusion. Coronary atherosclerosis. Atherosclerotic calcifications of the aortic arch. Visualized thyroid is unremarkable. Lungs/Pleura: Multifocal patchy opacities in the bilateral lower lobes, new from May 2016, suspicious for multifocal pneumonia. Dominant opacity measures 1.5 x 3.9 cm in the superior segment left lower lobe (series 6/ image 25). No pleural effusion or pneumothorax. Upper abdomen: Visualized upper abdomen is notable for a moderate hiatal hernia/ intrathoracic stomach and vascular calcifications. Musculoskeletal: Multiple lower thoracolumbar compression fracture deformities with prior vertebral augmentation of two lower thoracic vertebral bodies. Refer to dedicated thoracic spine CT for full report. Review of the MIP images confirms the above findings. IMPRESSION: No evidence of pulmonary embolism. Stable 4.3 cm ascending thoracic aortic aneurysm. Multifocal patchy opacities in the bilateral lower lobes, new from May 2016, suspicious for multifocal pneumonia. Electronically Signed   By: Julian Hy M.D.   On: 09/04/2015 10:24   Ct Thoracic Spine Wo Contrast  09/04/2015  CLINICAL DATA:  Upper back/left scapular pain EXAM: CT THORACIC SPINE WITHOUT CONTRAST TECHNIQUE: Multidetector CT imaging of the thoracic spine was performed without intravenous contrast administration. Multiplanar CT image reconstructions were also generated. COMPARISON:  Concurrent CTA chest dated 09/04/2015. Prior CTA chest dated 04/06/2015. FINDINGS: Refer to CTA chest report for pertinent vascular and pulmonary  findings. Exaggerated thoracic kyphosis. Prior vertebral augmentation at T11 and L1, unchanged. Multilevel thoracolumbar compression fracture deformities, unchanged from 04/06/2015, including: --Mild compression fracture deformities at T8 and L2 --Moderate compression fracture deformities at L3 (included on CTA chest) --Moderate to severe compression fracture deformities at T9 and T12 Moderate multilevel degenerative changes. No retropulsion. No evidence of acute fracture or dislocation. Left scapula is intact. IMPRESSION: Multilevel thoracolumbar compression fracture deformities, unchanged from May 2016, as described above. Prior vertebral augmentation at T11 and L1. No evidence of acute fracture or dislocation. Left scapula is intact. Refer to CTA chest report for pertinent vascular and pulmonary findings. Electronically Signed   By: Julian Hy M.D.   On: 09/04/2015 10:36   Dg Chest Port 1 View  09/04/2015  CLINICAL DATA:  Left shoulder pain and back pain 2 days. EXAM: PORTABLE CHEST 1 VIEW COMPARISON:  01/20/2013 FINDINGS: Patient is rotated to the left. Lungs are adequately inflated without consolidation or effusion. Mild stable cardiomegaly. Calcified plaque is present over the thoracic aorta. Evidence of known moderate size hiatal hernia. Stable curvature of the thoracic spine convex right. Evidence of previous  kyphoplasties over the mid to lower thoracic spine. IMPRESSION: No acute cardiopulmonary disease. Mild stable cardiomegaly. Hiatal hernia. Electronically Signed   By: Marin Olp M.D.   On: 09/04/2015 08:16      Lab Results  Component Value Date   HGBA1C  12/01/2010    5.4 (NOTE)                                                                       According to the ADA Clinical Practice Recommendations for 2011, when HbA1c is used as a screening test:   >=6.5%   Diagnostic of Diabetes Mellitus           (if abnormal result  is confirmed)  5.7-6.4%   Increased risk of developing  Diabetes Mellitus  References:Diagnosis and Classification of Diabetes Mellitus,Diabetes [id number](DDUKG 1):S62-S69 and Standards of Medical Care in         Diabetes - 2011,Diabetes [id number]  (Suppl 1):S11-S61.   Lab Results  Component Value Date   [facility]  12/02/2010    80        Total Cholesterol/HDL:CHD Risk Coronary Heart Disease Risk Table                     Men   Women  1/2 Average Risk   3.4   3.3  Average Risk       5.0   4.4  2 X Average Risk   9.6   7.1  3 X Average Risk  23.4   11.0        Use the calculated Patient Ratio above and the CHD Risk Table to determine the patient's CHD Risk.        ATP III CLASSIFICATION (LDL):  <100     mg/dL   Optimal  100-129  mg/dL   Near or Above                    Optimal  130-159  mg/dL   Borderline  160-189  mg/dL   High  >190     mg/dL   Very High   CREATININE 0.68 09/05/2015       Scheduled Meds: . amLODipine  5 mg Oral Daily  . aspirin EC  81 mg Oral Daily  . cholecalciferol  1,000 Units Oral Daily  . darifenacin  7.5 mg Oral Daily  . enoxaparin (LOVENOX) injection  40 mg Subcutaneous Q24H  . [START ON 09/06/2015] Influenza vac split quadrivalent PF  0.5 mL Intramuscular Tomorrow-1000  . [START ON 09/06/2015] levofloxacin (LEVAQUIN) IV  750 mg Intravenous Q48H  . levothyroxine  112 mcg Oral QAC breakfast  . lidocaine  1 patch Transdermal Q24H  . metoprolol succinate  25 mg Oral Daily  . multivitamin  1 tablet Oral Daily  . omeprazole  20 mg Oral Daily  . [START ON 09/06/2015] pneumococcal 23 valent vaccine  0.5 mL Intramuscular Tomorrow-1000  . pravastatin  20 mg Oral QPM   Continuous Infusions:   Principal Problem:   CAP (community acquired pneumonia) Active Problems:   Rib fractures   Fracture lumbar vertebra-closed ([place])   History of DVT of lower extremity   Hypothyroidism   Community acquired pneumonia    Time spent: 53  minutes   [facility] Pager [phone]. If 7PM-7AM,  please contact night-coverage at [email or web address], password [facility] 09/05/2015, 12:55 PM  LOS: 1 day

## 2015-09-05 NOTE — Progress Notes (Signed)
Subjective: Patient reports "I didn't know I had pneumonia until they told me, just a cough that wouldn't let me sleep"  Objective: Vital signs in last 24 hours: Temp:  [97.8 F (36.6 C)-98 F (36.7 C)] 98 F (36.7 C) (10/24 0507) Pulse Rate:  [72-84] 72 (10/24 0507) Resp:  [18] 18 (10/24 0507) BP: (119-126)/(63-73) 126/73 mmHg (10/24 0507) SpO2:  [95 %-97 %] 95 % (10/24 0507)  Intake/Output from previous day: 10/23 0701 - 10/24 0700 In: 830.3 [P.O.:220; I.V.:460.3; IV Piggyback:150] Out: -  Intake/Output this shift: Total I/O In: 240 [P.O.:240] Out: -   Alert, conversant. Reports left mid-to-upper scapular pain intermittent  with severe episodes "takes my breath". She notes at least one episode of left arm pain/ache shoulder to hand.  Pain levels improved with Lidoderm patches and Oxy IR on PRN basis. She feels morphine has not helped. Exam reveals left hand intrinsics weaker than right, otherwise strong BUE and BLE.  Dr. Vertell Limber has reviewed T-spine CT.   Lab Results:  Recent Labs  09/04/15 0805 09/04/15 0839 09/05/15 0010  WBC 8.9  --  8.4  HGB 10.8* 13.3 9.3*  HCT 33.5* 39.0 28.9*  PLT 378  --  302   BMET  Recent Labs  09/04/15 0805 09/04/15 0839 09/04/15 1203 09/05/15 0010  NA 132* 134*  --  134*  K 3.7 3.7  --  3.8  CL 98* 99*  --  103  CO2 26  --   --  25  GLUCOSE 132* 129*  --  96  BUN 27* 30*  --  22*  CREATININE 0.59 0.60 0.57 0.68  CALCIUM 8.9  --   --  8.3*    Studies/Results: Ct Angio Chest Pe W/cm &/or Wo Cm  09/04/2015  CLINICAL DATA:  Left scapular pain x 2 days, upper back pain, history of breast cancer status post right lumpectomy. EXAM: CT ANGIOGRAPHY CHEST WITH CONTRAST TECHNIQUE: Multidetector CT imaging of the chest was performed using the standard protocol during bolus administration of intravenous contrast. Multiplanar CT image reconstructions and MIPs were obtained to evaluate the vascular anatomy. CONTRAST:  31mL OMNIPAQUE IOHEXOL 350  MG/ML SOLN COMPARISON:  CTA chest dated 04/06/2015 FINDINGS: No evidence pulmonary embolism. Stable 4.3 cm ascending thoracic aortic aneurysm (series 5/image 66). Mediastinum/Nodes: The heart is normal in size. No pericardial effusion. Coronary atherosclerosis. Atherosclerotic calcifications of the aortic arch. Visualized thyroid is unremarkable. Lungs/Pleura: Multifocal patchy opacities in the bilateral lower lobes, new from May 2016, suspicious for multifocal pneumonia. Dominant opacity measures 1.5 x 3.9 cm in the superior segment left lower lobe (series 6/ image 25). No pleural effusion or pneumothorax. Upper abdomen: Visualized upper abdomen is notable for a moderate hiatal hernia/ intrathoracic stomach and vascular calcifications. Musculoskeletal: Multiple lower thoracolumbar compression fracture deformities with prior vertebral augmentation of two lower thoracic vertebral bodies. Refer to dedicated thoracic spine CT for full report. Review of the MIP images confirms the above findings. IMPRESSION: No evidence of pulmonary embolism. Stable 4.3 cm ascending thoracic aortic aneurysm. Multifocal patchy opacities in the bilateral lower lobes, new from May 2016, suspicious for multifocal pneumonia. Electronically Signed   By: Julian Hy M.D.   On: 09/04/2015 10:24   Ct Thoracic Spine Wo Contrast  09/04/2015  CLINICAL DATA:  Upper back/left scapular pain EXAM: CT THORACIC SPINE WITHOUT CONTRAST TECHNIQUE: Multidetector CT imaging of the thoracic spine was performed without intravenous contrast administration. Multiplanar CT image reconstructions were also generated. COMPARISON:  Concurrent CTA chest dated 09/04/2015.  Prior CTA chest dated 04/06/2015. FINDINGS: Refer to CTA chest report for pertinent vascular and pulmonary findings. Exaggerated thoracic kyphosis. Prior vertebral augmentation at T11 and L1, unchanged. Multilevel thoracolumbar compression fracture deformities, unchanged from 04/06/2015,  including: --Mild compression fracture deformities at T8 and L2 --Moderate compression fracture deformities at L3 (included on CTA chest) --Moderate to severe compression fracture deformities at T9 and T12 Moderate multilevel degenerative changes. No retropulsion. No evidence of acute fracture or dislocation. Left scapula is intact. IMPRESSION: Multilevel thoracolumbar compression fracture deformities, unchanged from May 2016, as described above. Prior vertebral augmentation at T11 and L1. No evidence of acute fracture or dislocation. Left scapula is intact. Refer to CTA chest report for pertinent vascular and pulmonary findings. Electronically Signed   By: Julian Hy M.D.   On: 09/04/2015 10:36   Dg Chest Port 1 View  09/04/2015  CLINICAL DATA:  Left shoulder pain and back pain 2 days. EXAM: PORTABLE CHEST 1 VIEW COMPARISON:  01/20/2013 FINDINGS: Patient is rotated to the left. Lungs are adequately inflated without consolidation or effusion. Mild stable cardiomegaly. Calcified plaque is present over the thoracic aorta. Evidence of known moderate size hiatal hernia. Stable curvature of the thoracic spine convex right. Evidence of previous kyphoplasties over the mid to lower thoracic spine. IMPRESSION: No acute cardiopulmonary disease. Mild stable cardiomegaly. Hiatal hernia. Electronically Signed   By: Marin Olp M.D.   On: 09/04/2015 08:16    Assessment/Plan:   LOS: 1 day  Per DrStern, MRI cervical spine without contrast today. If no acute findings, will f/u on outpatient basis. No additional recommendations for pain control. Currently pneumonia tx takes priority.    Verdis Prime 09/05/2015, 2:42 PM

## 2015-09-06 DIAGNOSIS — I714 Abdominal aortic aneurysm, without rupture: Secondary | ICD-10-CM

## 2015-09-06 LAB — IRON AND TIBC
IRON: 19 ug/dL — AB (ref 28–170)
Saturation Ratios: 5 % — ABNORMAL LOW (ref 10.4–31.8)
TIBC: 361 ug/dL (ref 250–450)
UIBC: 342 ug/dL

## 2015-09-06 LAB — FERRITIN: Ferritin: 9 ng/mL — ABNORMAL LOW (ref 11–307)

## 2015-09-06 LAB — RETICULOCYTES
RBC.: 5.02 MIL/uL (ref 3.87–5.11)
RETIC COUNT ABSOLUTE: 85.3 10*3/uL (ref 19.0–186.0)
Retic Ct Pct: 1.7 % (ref 0.4–3.1)

## 2015-09-06 LAB — FOLATE: FOLATE: 36.5 ng/mL (ref >5.9)

## 2015-09-06 LAB — VITAMIN B12: VITAMIN B 12: 3525 pg/mL — AB (ref 180–914)

## 2015-09-06 MED ORDER — PANTOPRAZOLE SODIUM 40 MG PO TBEC: 40.0000 mg | DELAYED_RELEASE_TABLET | Freq: Every day | ORAL | Status: DC

## 2015-09-06 MED ORDER — DEXAMETHASONE SODIUM PHOSPHATE 4 MG/ML IJ SOLN
4.0000 mg | Freq: Two times a day (BID) | INTRAMUSCULAR | Status: DC
  Administered 2015-09-06 – 2015-09-09 (×7): 4 mg via INTRAVENOUS
  Filled 2015-09-06 (×7): qty 1

## 2015-09-06 NOTE — Progress Notes (Addendum)
[facility] PROGRESS NOTE  SHRINIKA BLATZ SWH:[id number] DOB: 1936/01/28 DOA: 09/04/2015 PCP: [name], MD  Length of stay: 2   Assessment/Plan: Principal Problem:   CAP (community acquired pneumonia) Active Problems:   Rib fractures   Fracture lumbar vertebra-closed ([place])   History of DVT of lower extremity   Hypothyroidism   Community acquired pneumonia     1. CAP (community acquired pneumonia) Continue IV Levaquin, day #2, can switch to by mouth, continue supplemental O2, NEB breathing treatments, monitor CBC. Stable from a pulmonary standpoint to have surgery on Friday  2. Acute pain in the L scapular area, L upper arm area, history of multi level compression deformities with severe kyphoscoliosis: History of vertebroplasty of the T11- L1 level by Kristeen Miss, MD in  2014 Continue with IV Morphine PRN, patient takes Vicodin at home without relief, states she has tried Oxycodone which has made her too dizzy. Will add Lidoderm patch, add low dose PRN Oxycodone IR as well as Tylenol with Codeine for pain control.   Dr Donald Pore discussed MRI findings with the patient. Need surgery for C7-T1 disc extrusion causing cervical radiculopathy and symptoms of left upper extremity pain and numbness. Anticipated to have surgery on Friday. Patient has been started on Decadron IV for her pain.   3. Remote history of LLE DVT not currently on any anti coagulation: DVT prophylaxis with Lovenox.   4.   ascending thoracic aortic aneurysm.-Followed by Ivin Poot, MD. Consulted CTS to clear patient for surgery on Friday.  5. Microcytic anemia, hemoglobin dropped from 13.3 -9.Marland Kitchen No obvious source of bleeding, check anemia panel.  stool guaiac   DVT prophylaxsis Lovenox, hold after midnight on Thursday  Code Status:      Code Status Orders        Start     Ordered   09/04/15 1154  Full code   Continuous     09/04/15 1159     Advance Directive Documentation        Most Recent Value   Type of Advance Directive  Healthcare Power of Attorney, Living will   Pre-existing out of facility DNR order (yellow form or pink MOST form)     "MOST" Form in Place?       Family Communication: family updated about patient's clinical progress Disposition Plan: Anticipated to have surgery on Friday   Brief narrative: 79 y.o. female with a past medical history significant for multilevel degenerative joint disease status post kyphoplasty, remote history of breast cancer, history of gastroesophageal reflux disease, remote history of DVT in the past. The patient's husband was recently hospitalized for pneumonia, he is now currently in a facility for rehabilitation.  Over the course of the past 3-4 days, the patient has had a cough. Initially it was productive and she felt congested. Especially since the last 24 hours she has had a dry cough. She has significant pain in her shoulder/back/scapular area on the left side. Her neighbor brought her in to the emergency department today. She lives by herself currently [place].  The patient has a history of thoracic aneurysm. She is being observed by vascular surgery. The patient underwent workup in the emergency department with a CT scan of the chest showing stable thoracic aneurysm. Patient showed multilevel degenerative disease. CT scan of the chest also showed multilevel bilateral lower lobes infiltrates. Patient denies any fevers denies any chills denies any shortness of breath. Denies any nausea  or vomiting. She denies any chest discomfort. She has had her flu shot earlier this year.  In the emergency department, she has received IV Levaquin, for mail grams of IV morphine, by mouth Flexeril. A hospitalist admission was requested for management of community-acquired pneumonia, acute on chronic pain secondary to her multilevel degenerative joint  disease.  Consultants:  None  Procedures:  None  Antibiotics: Anti-infectives    Start     Dose/Rate Route Frequency Ordered Stop   09/06/15 1200  levofloxacin (LEVAQUIN) IVPB 750 mg     750 mg 100 mL/hr over 90 Minutes Intravenous Every 48 hours 09/05/15 0937     09/04/15 1200  levofloxacin (LEVAQUIN) IVPB 750 mg  Status:  Discontinued     750 mg 100 mL/hr over 90 Minutes Intravenous Every 24 hours 09/04/15 1159 09/05/15 0937   09/04/15 1045  levofloxacin (LEVAQUIN) IVPB 750 mg  Status:  Discontinued     750 mg 100 mL/hr over 90 Minutes Intravenous  Once 09/04/15 1043 09/04/15 1305         HPI/Subjective: No pulmonary complaints, continue to complain of pain in her C-spine and her left upper extremity with numbness of the left hand and forearm  Objective: Filed Vitals:   09/05/15 0507 09/05/15 1555 09/05/15 2158 09/06/15 0507  BP: 126/73 115/74 128/64 132/88  Pulse: 72 79 52 76  Temp: 98 F (36.7 C) 98.4 F (36.9 C) 98.5 F (36.9 C) 97.8 F (36.6 C)  TempSrc: Oral Oral Oral Oral  Resp: 18 16 18 18   Height:      Weight:      SpO2: 95% 95% 97% 96%    Intake/Output Summary (Last 24 hours) at 09/06/15 1309 Last data filed at 09/06/15 0850  Gross per 24 hour  Intake    236 ml  Output    300 ml  Net    -64 ml    Exam:  General: No acute respiratory distress Lungs: Clear to auscultation bilaterally without wheezes or crackles Cardiovascular: Regular rate and rhythm without murmur gallop or rub normal S1 and S2 Abdomen: Nontender, nondistended, soft, bowel sounds positive, no rebound, no ascites, no appreciable mass Extremities: No significant cyanosis, clubbing, or edema bilateral lower extremities     Data Review   Micro Results Recent Results (from the past 240 hour(s))  Blood culture (routine x 2)     Status: None (Preliminary result)   Collection Time: 09/04/15 10:50 AM  Result Value Ref Range Status   Specimen Description BLOOD LEFT ARM  Final    Special Requests BOTTLES DRAWN AEROBIC ONLY 6CC  Final   Culture NO GROWTH 1 DAY  Final   Report Status PENDING  Incomplete  Blood culture (routine x 2)     Status: None (Preliminary result)   Collection Time: 09/04/15 10:55 AM  Result Value Ref Range Status   Specimen Description BLOOD BLOOD LEFT FOREARM  Final   Special Requests BOTTLES DRAWN AEROBIC ONLY [place]  Final   Culture NO GROWTH 1 DAY  Final   Report Status PENDING  Incomplete  Urine culture     Status: None   Collection Time: 09/04/15  2:04 PM  Result Value Ref Range Status   Specimen Description URINE, RANDOM  Final   Special Requests NONE  Final   Culture NO GROWTH 1 DAY  Final   Report Status 09/05/2015 FINAL  Final    Radiology Reports Ct Angio Chest Pe W/cm &/or Wo Cm  09/04/2015  CLINICAL DATA:  Left scapular pain x 2 days, upper back pain, history of breast cancer status post right lumpectomy. EXAM: CT ANGIOGRAPHY CHEST WITH CONTRAST TECHNIQUE: Multidetector CT imaging of the chest was performed using the standard protocol during bolus administration of intravenous contrast. Multiplanar CT image reconstructions and MIPs were obtained to evaluate the vascular anatomy. CONTRAST:  43mL OMNIPAQUE IOHEXOL 350 MG/ML SOLN COMPARISON:  CTA chest dated 04/06/2015 FINDINGS: No evidence pulmonary embolism. Stable 4.3 cm ascending thoracic aortic aneurysm (series 5/image 66). Mediastinum/Nodes: The heart is normal in size. No pericardial effusion. Coronary atherosclerosis. Atherosclerotic calcifications of the aortic arch. Visualized thyroid is unremarkable. Lungs/Pleura: Multifocal patchy opacities in the bilateral lower lobes, new from May 2016, suspicious for multifocal pneumonia. Dominant opacity measures 1.5 x 3.9 cm in the superior segment left lower lobe (series 6/ image 25). No pleural effusion or pneumothorax. Upper abdomen: Visualized upper abdomen is notable for a moderate hiatal hernia/ intrathoracic stomach and vascular  calcifications. Musculoskeletal: Multiple lower thoracolumbar compression fracture deformities with prior vertebral augmentation of two lower thoracic vertebral bodies. Refer to dedicated thoracic spine CT for full report. Review of the MIP images confirms the above findings. IMPRESSION: No evidence of pulmonary embolism. Stable 4.3 cm ascending thoracic aortic aneurysm. Multifocal patchy opacities in the bilateral lower lobes, new from May 2016, suspicious for multifocal pneumonia. Electronically Signed   By: Julian Hy M.D.   On: 09/04/2015 10:24   Ct Thoracic Spine Wo Contrast  09/04/2015  CLINICAL DATA:  Upper back/left scapular pain EXAM: CT THORACIC SPINE WITHOUT CONTRAST TECHNIQUE: Multidetector CT imaging of the thoracic spine was performed without intravenous contrast administration. Multiplanar CT image reconstructions were also generated. COMPARISON:  Concurrent CTA chest dated 09/04/2015. Prior CTA chest dated 04/06/2015. FINDINGS: Refer to CTA chest report for pertinent vascular and pulmonary findings. Exaggerated thoracic kyphosis. Prior vertebral augmentation at T11 and L1, unchanged. Multilevel thoracolumbar compression fracture deformities, unchanged from 04/06/2015, including: --Mild compression fracture deformities at T8 and L2 --Moderate compression fracture deformities at L3 (included on CTA chest) --Moderate to severe compression fracture deformities at T9 and T12 Moderate multilevel degenerative changes. No retropulsion. No evidence of acute fracture or dislocation. Left scapula is intact. IMPRESSION: Multilevel thoracolumbar compression fracture deformities, unchanged from May 2016, as described above. Prior vertebral augmentation at T11 and L1. No evidence of acute fracture or dislocation. Left scapula is intact. Refer to CTA chest report for pertinent vascular and pulmonary findings. Electronically Signed   By: Julian Hy M.D.   On: 09/04/2015 10:36   Mr Cervical Spine  Wo Contrast  09/05/2015  CLINICAL DATA:  79 year old female with acute onset pain radiating to the left shoulder and scapula. Coughing for 4 days. Initial encounter. Multilevel thoracic compression fractures with some previous augmentation. Thoracic aortic aneurysm. EXAM: MRI CERVICAL SPINE WITHOUT CONTRAST TECHNIQUE: Multiplanar, multisequence MR imaging of the cervical spine was performed. No intravenous contrast was administered. COMPARISON:  Thoracic spine CT 09/04/2015. FINDINGS: Preserved cervical lordosis. Mild retrolisthesis at C5-C6. Chronic disc and endplate degeneration at that level and C6-C7. No marrow edema or evidence of acute osseous abnormality. Cervicomedullary junction is within normal limits. Patchy T2 hyperintensity in the pons. Grossly negative visualized brain parenchyma otherwise. No spinal cord signal abnormality. Negative cervical paraspinal soft tissues. C2-C3: Left paracentral disc osteophyte complex, no significant stenosis. C3-C4: Left uncovertebral hypertrophy. Mild facet and ligament flavum hypertrophy. No spinal stenosis. Moderate left C4 foraminal stenosis. C4-C5: Moderate facet hypertrophy greater on the right. Mild uncovertebral and ligament flavum hypertrophy. No  spinal stenosis. Mild left and moderate right C5 foraminal stenosis. C5-C6: Mild retrolisthesis. Moderate ligament flavum hypertrophy. Disc space loss with circumferential disc osteophyte complex. Mild spinal stenosis. No definite spinal cord mass effect. Uncovertebral hypertrophy. Severe left and mild to moderate right C6 foraminal stenosis. C6-C7: Disc space loss. Circumferential disc osteophyte complex with broad-based posterior component. Mild ligament flavum hypertrophy. Borderline to mild spinal stenosis with no cord mass effect. Uncovertebral hypertrophy. Mild C7 foraminal stenosis greater on the left. C7-T1: Trace anterolisthesis. Mild circumferential disc bulge. Dark T2 and intermediate to high 8 STIR signal  filling defect in the left lateral recess and proximal left C8 neural foramen best seen on series 3, images 9 and 10 (also series 7, image 24). Mild facet and ligament flavum hypertrophy. No spinal stenosis. Negative right C8 neural foramen. No upper thoracic spinal stenosis. IMPRESSION: 1. C7-T1 disc extrusion into the left lateral recess and proximal neural foramen suspected. No significant spinal stenosis. Query left C8 radiculitis. 2. Mild multifactorial degenerative spinal stenosis at C5-C6 and C6-C7. Chronic appearing multifactorial moderate or severe foraminal stenosis at the left C4, right C5, and bilateral C6 nerve levels. Electronically Signed   By: Genevie Ann M.D.   On: 09/05/2015 18:03   Dg Chest Port 1 View  09/04/2015  CLINICAL DATA:  Left shoulder pain and back pain 2 days. EXAM: PORTABLE CHEST 1 VIEW COMPARISON:  01/20/2013 FINDINGS: Patient is rotated to the left. Lungs are adequately inflated without consolidation or effusion. Mild stable cardiomegaly. Calcified plaque is present over the thoracic aorta. Evidence of known moderate size hiatal hernia. Stable curvature of the thoracic spine convex right. Evidence of previous kyphoplasties over the mid to lower thoracic spine. IMPRESSION: No acute cardiopulmonary disease. Mild stable cardiomegaly. Hiatal hernia. Electronically Signed   By: Marin Olp M.D.   On: 09/04/2015 08:16     CBC  Recent Labs Lab 09/04/15 0805 09/04/15 0839 09/05/15 0010  WBC 8.9  --  8.4  HGB 10.8* 13.3 9.3*  HCT 33.5* 39.0 28.9*  PLT 378  --  302  MCV 68.4*  --  68.5*  MCH 22.0*  --  22.0*  MCHC 32.2  --  32.2  RDW 19.0*  --  19.0*  LYMPHSABS 0.6*  --   --   MONOABS 0.5  --   --   EOSABS 0.0  --   --   BASOSABS 0.0  --   --     Chemistries   Recent Labs Lab 09/04/15 0805 09/04/15 0839 09/04/15 1203 09/05/15 0010  NA 132* 134*  --  134*  K 3.7 3.7  --  3.8  CL 98* 99*  --  103  CO2 26  --   --  25  GLUCOSE 132* 129*  --  96  BUN 27* 30*   --  22*  CREATININE 0.59 0.60 0.57 0.68  CALCIUM 8.9  --   --  8.3*  AST 27  --   --   --   ALT 21  --   --   --   ALKPHOS 54  --   --   --   BILITOT 0.5  --   --   --    ------------------------------------------------------------------------------------------------------------------ estimated creatinine clearance is 42.3 mL/min (by C-G formula based on Cr of 0.68). ------------------------------------------------------------------------------------------------------------------ No results for input(s): HGBA1C in the last 72 hours. ------------------------------------------------------------------------------------------------------------------ No results for input(s): CHOL, HDL, LDLCALC, TRIG, CHOLHDL, LDLDIRECT in the last 72 hours. ------------------------------------------------------------------------------------------------------------------ No results for input(s):  TSH, T4TOTAL, T3FREE, THYROIDAB in the last 72 hours.  Invalid input(s): FREET3 ------------------------------------------------------------------------------------------------------------------ No results for input(s): VITAMINB12, FOLATE, FERRITIN, TIBC, IRON, RETICCTPCT in the last 72 hours.  Coagulation profile No results for input(s): INR, PROTIME in the last 168 hours.  No results for input(s): DDIMER in the last 72 hours.  Cardiac Enzymes  Recent Labs Lab 09/04/15 1203 09/04/15 1806 09/05/15 0010  TROPONINI <0.03 <0.03 <0.03   ------------------------------------------------------------------------------------------------------------------ Invalid input(s): POCBNP   CBG: No results for input(s): GLUCAP in the last 168 hours.     Studies: Mr Cervical Spine Wo Contrast  09/05/2015  CLINICAL DATA:  79 year old female with acute onset pain radiating to the left shoulder and scapula. Coughing for 4 days. Initial encounter. Multilevel thoracic compression fractures with some previous augmentation.  Thoracic aortic aneurysm. EXAM: MRI CERVICAL SPINE WITHOUT CONTRAST TECHNIQUE: Multiplanar, multisequence MR imaging of the cervical spine was performed. No intravenous contrast was administered. COMPARISON:  Thoracic spine CT 09/04/2015. FINDINGS: Preserved cervical lordosis. Mild retrolisthesis at C5-C6. Chronic disc and endplate degeneration at that level and C6-C7. No marrow edema or evidence of acute osseous abnormality. Cervicomedullary junction is within normal limits. Patchy T2 hyperintensity in the pons. Grossly negative visualized brain parenchyma otherwise. No spinal cord signal abnormality. Negative cervical paraspinal soft tissues. C2-C3: Left paracentral disc osteophyte complex, no significant stenosis. C3-C4: Left uncovertebral hypertrophy. Mild facet and ligament flavum hypertrophy. No spinal stenosis. Moderate left C4 foraminal stenosis. C4-C5: Moderate facet hypertrophy greater on the right. Mild uncovertebral and ligament flavum hypertrophy. No spinal stenosis. Mild left and moderate right C5 foraminal stenosis. C5-C6: Mild retrolisthesis. Moderate ligament flavum hypertrophy. Disc space loss with circumferential disc osteophyte complex. Mild spinal stenosis. No definite spinal cord mass effect. Uncovertebral hypertrophy. Severe left and mild to moderate right C6 foraminal stenosis. C6-C7: Disc space loss. Circumferential disc osteophyte complex with broad-based posterior component. Mild ligament flavum hypertrophy. Borderline to mild spinal stenosis with no cord mass effect. Uncovertebral hypertrophy. Mild C7 foraminal stenosis greater on the left. C7-T1: Trace anterolisthesis. Mild circumferential disc bulge. Dark T2 and intermediate to high 8 STIR signal filling defect in the left lateral recess and proximal left C8 neural foramen best seen on series 3, images 9 and 10 (also series 7, image 24). Mild facet and ligament flavum hypertrophy. No spinal stenosis. Negative right C8 neural foramen. No  upper thoracic spinal stenosis. IMPRESSION: 1. C7-T1 disc extrusion into the left lateral recess and proximal neural foramen suspected. No significant spinal stenosis. Query left C8 radiculitis. 2. Mild multifactorial degenerative spinal stenosis at C5-C6 and C6-C7. Chronic appearing multifactorial moderate or severe foraminal stenosis at the left C4, right C5, and bilateral C6 nerve levels. Electronically Signed   By: Genevie Ann M.D.   On: 09/05/2015 18:03      Lab Results  Component Value Date   HGBA1C  12/01/2010    5.4 (NOTE)                                                                       According to the ADA Clinical Practice Recommendations for 2011, when HbA1c is used as a screening test:   >=6.5%   Diagnostic of Diabetes Mellitus           (  if abnormal result  is confirmed)  5.7-6.4%   Increased risk of developing Diabetes Mellitus  References:Diagnosis and Classification of Diabetes Mellitus,Diabetes XBLT,9030,09(QZRAQ 1):S62-S69 and Standards of Medical Care in         Diabetes - 2011,Diabetes TMAU,6333,54  (Suppl 1):S11-S61.   Lab Results  Component Value Date   [facility]  12/02/2010    80        Total Cholesterol/HDL:CHD Risk Coronary Heart Disease Risk Table                     Men   Women  1/2 Average Risk   3.4   3.3  Average Risk       5.0   4.4  2 X Average Risk   9.6   7.1  3 X Average Risk  23.4   11.0        Use the calculated Patient Ratio above and the CHD Risk Table to determine the patient's CHD Risk.        ATP III CLASSIFICATION (LDL):  <100     mg/dL   Optimal  100-129  mg/dL   Near or Above                    Optimal  130-159  mg/dL   Borderline  160-189  mg/dL   High  >190     mg/dL   Very High   CREATININE 0.68 09/05/2015       Scheduled Meds: . amLODipine  5 mg Oral Daily  . aspirin EC  81 mg Oral Daily  . cholecalciferol  1,000 Units Oral Daily  . darifenacin  7.5 mg Oral Daily  . dexamethasone  4 mg Intravenous Q12H  . enoxaparin  (LOVENOX) injection  40 mg Subcutaneous Q24H  . Influenza vac split quadrivalent PF  0.5 mL Intramuscular Tomorrow-1000  . levofloxacin (LEVAQUIN) IV  750 mg Intravenous Q48H  . levothyroxine  112 mcg Oral QAC breakfast  . lidocaine  1 patch Transdermal Q24H  . metoprolol succinate  25 mg Oral Daily  . multivitamin  1 tablet Oral Daily  . omeprazole  20 mg Oral Daily  . pneumococcal 23 valent vaccine  0.5 mL Intramuscular Tomorrow-1000  . pravastatin  20 mg Oral QPM   Continuous Infusions:   Principal Problem:   CAP (community acquired pneumonia) Active Problems:   Rib fractures   Fracture lumbar vertebra-closed ([place])   History of DVT of lower extremity   Hypothyroidism   Community acquired pneumonia    Time spent: 31 minutes   [facility] Pager [phone]. If 7PM-7AM, please contact night-coverage at [email or web address], password [facility] 09/06/2015, 1:09 PM  LOS: 2 days

## 2015-09-06 NOTE — Progress Notes (Signed)
Subjective: Patient reports "My arm is hurting again today"  Objective: Vital signs in last 24 hours: Temp:  [97.8 F (36.6 C)-98.5 F (36.9 C)] 97.8 F (36.6 C) (10/25 0507) Pulse Rate:  [52-79] 76 (10/25 0507) Resp:  [16-18] 18 (10/25 0507) BP: (115-132)/(64-88) 132/88 mmHg (10/25 0507) SpO2:  [95 %-97 %] 96 % (10/25 0507)  Intake/Output from previous day: 10/24 0701 - 10/25 0700 In: 240 [P.O.:240] Out: -  Intake/Output this shift: Total I/O In: 236 [P.O.:236] Out: 300 [Urine:300]  Alert, conversant. Reports recurrent LUE pain with Left scapula pain.   Lab Results:  Recent Labs  09/04/15 0805 09/04/15 0839 09/05/15 0010  WBC 8.9  --  8.4  HGB 10.8* 13.3 9.3*  HCT 33.5* 39.0 28.9*  PLT 378  --  302   BMET  Recent Labs  09/04/15 0805 09/04/15 0839 09/04/15 1203 09/05/15 0010  NA 132* 134*  --  134*  K 3.7 3.7  --  3.8  CL 98* 99*  --  103  CO2 26  --   --  25  GLUCOSE 132* 129*  --  96  BUN 27* 30*  --  22*  CREATININE 0.59 0.60 0.57 0.68  CALCIUM 8.9  --   --  8.3*    Studies/Results: Ct Angio Chest Pe W/cm &/or Wo Cm  09/04/2015  CLINICAL DATA:  Left scapular pain x 2 days, upper back pain, history of breast cancer status post right lumpectomy. EXAM: CT ANGIOGRAPHY CHEST WITH CONTRAST TECHNIQUE: Multidetector CT imaging of the chest was performed using the standard protocol during bolus administration of intravenous contrast. Multiplanar CT image reconstructions and MIPs were obtained to evaluate the vascular anatomy. CONTRAST:  58mL OMNIPAQUE IOHEXOL 350 MG/ML SOLN COMPARISON:  CTA chest dated 04/06/2015 FINDINGS: No evidence pulmonary embolism. Stable 4.3 cm ascending thoracic aortic aneurysm (series 5/image 66). Mediastinum/Nodes: The heart is normal in size. No pericardial effusion. Coronary atherosclerosis. Atherosclerotic calcifications of the aortic arch. Visualized thyroid is unremarkable. Lungs/Pleura: Multifocal patchy opacities in the bilateral  lower lobes, new from May 2016, suspicious for multifocal pneumonia. Dominant opacity measures 1.5 x 3.9 cm in the superior segment left lower lobe (series 6/ image 25). No pleural effusion or pneumothorax. Upper abdomen: Visualized upper abdomen is notable for a moderate hiatal hernia/ intrathoracic stomach and vascular calcifications. Musculoskeletal: Multiple lower thoracolumbar compression fracture deformities with prior vertebral augmentation of two lower thoracic vertebral bodies. Refer to dedicated thoracic spine CT for full report. Review of the MIP images confirms the above findings. IMPRESSION: No evidence of pulmonary embolism. Stable 4.3 cm ascending thoracic aortic aneurysm. Multifocal patchy opacities in the bilateral lower lobes, new from May 2016, suspicious for multifocal pneumonia. Electronically Signed   By: Julian Hy M.D.   On: 09/04/2015 10:24   Ct Thoracic Spine Wo Contrast  09/04/2015  CLINICAL DATA:  Upper back/left scapular pain EXAM: CT THORACIC SPINE WITHOUT CONTRAST TECHNIQUE: Multidetector CT imaging of the thoracic spine was performed without intravenous contrast administration. Multiplanar CT image reconstructions were also generated. COMPARISON:  Concurrent CTA chest dated 09/04/2015. Prior CTA chest dated 04/06/2015. FINDINGS: Refer to CTA chest report for pertinent vascular and pulmonary findings. Exaggerated thoracic kyphosis. Prior vertebral augmentation at T11 and L1, unchanged. Multilevel thoracolumbar compression fracture deformities, unchanged from 04/06/2015, including: --Mild compression fracture deformities at T8 and L2 --Moderate compression fracture deformities at L3 (included on CTA chest) --Moderate to severe compression fracture deformities at T9 and T12 Moderate multilevel degenerative changes. No retropulsion. No  evidence of acute fracture or dislocation. Left scapula is intact. IMPRESSION: Multilevel thoracolumbar compression fracture deformities,  unchanged from May 2016, as described above. Prior vertebral augmentation at T11 and L1. No evidence of acute fracture or dislocation. Left scapula is intact. Refer to CTA chest report for pertinent vascular and pulmonary findings. Electronically Signed   By: Julian Hy M.D.   On: 09/04/2015 10:36   Mr Cervical Spine Wo Contrast  09/05/2015  CLINICAL DATA:  79 year old female with acute onset pain radiating to the left shoulder and scapula. Coughing for 4 days. Initial encounter. Multilevel thoracic compression fractures with some previous augmentation. Thoracic aortic aneurysm. EXAM: MRI CERVICAL SPINE WITHOUT CONTRAST TECHNIQUE: Multiplanar, multisequence MR imaging of the cervical spine was performed. No intravenous contrast was administered. COMPARISON:  Thoracic spine CT 09/04/2015. FINDINGS: Preserved cervical lordosis. Mild retrolisthesis at C5-C6. Chronic disc and endplate degeneration at that level and C6-C7. No marrow edema or evidence of acute osseous abnormality. Cervicomedullary junction is within normal limits. Patchy T2 hyperintensity in the pons. Grossly negative visualized brain parenchyma otherwise. No spinal cord signal abnormality. Negative cervical paraspinal soft tissues. C2-C3: Left paracentral disc osteophyte complex, no significant stenosis. C3-C4: Left uncovertebral hypertrophy. Mild facet and ligament flavum hypertrophy. No spinal stenosis. Moderate left C4 foraminal stenosis. C4-C5: Moderate facet hypertrophy greater on the right. Mild uncovertebral and ligament flavum hypertrophy. No spinal stenosis. Mild left and moderate right C5 foraminal stenosis. C5-C6: Mild retrolisthesis. Moderate ligament flavum hypertrophy. Disc space loss with circumferential disc osteophyte complex. Mild spinal stenosis. No definite spinal cord mass effect. Uncovertebral hypertrophy. Severe left and mild to moderate right C6 foraminal stenosis. C6-C7: Disc space loss. Circumferential disc osteophyte  complex with broad-based posterior component. Mild ligament flavum hypertrophy. Borderline to mild spinal stenosis with no cord mass effect. Uncovertebral hypertrophy. Mild C7 foraminal stenosis greater on the left. C7-T1: Trace anterolisthesis. Mild circumferential disc bulge. Dark T2 and intermediate to high 8 STIR signal filling defect in the left lateral recess and proximal left C8 neural foramen best seen on series 3, images 9 and 10 (also series 7, image 24). Mild facet and ligament flavum hypertrophy. No spinal stenosis. Negative right C8 neural foramen. No upper thoracic spinal stenosis. IMPRESSION: 1. C7-T1 disc extrusion into the left lateral recess and proximal neural foramen suspected. No significant spinal stenosis. Query left C8 radiculitis. 2. Mild multifactorial degenerative spinal stenosis at C5-C6 and C6-C7. Chronic appearing multifactorial moderate or severe foraminal stenosis at the left C4, right C5, and bilateral C6 nerve levels. Electronically Signed   By: Genevie Ann M.D.   On: 09/05/2015 18:03    Assessment/Plan:   LOS: 2 days  Dr. Vertell Limber will review MRI.    Verdis Prime 09/06/2015, 9:54 AM    Patient has significant left hand weakness and pain.  I have recommended ACDF C 7 T 1 level based on her weakness and the severity of her pain.  Plan is to proceed with surgery on 09/09/15.

## 2015-09-06 NOTE — Evaluation (Signed)
Occupational Therapy Evaluation Patient Details Name: [name] MRN: [id number] DOB: [birth year] Today's Date: 09/06/2015    History of Present Illness Patient is a 79 y/o female presents with left upper back pain and cough. CT scan of the chest showed multilevel bilateral lower lobes infiltrates. Neurosurgery following.  PMH includes multilevel DJD s/p kyphoplasty, breast cancer, DVT, thoracic aneurysm.   Clinical Impression   Pt was independent in self care and light housekeeping/meal prep prior to admission.  She used a walker outside of her home.  Pt drives to see her husband in [place] home daily.  She presents with increased pain in her L shoulder which significantly impacts her OOB activity tolerance, neurosurgery is following. Educated in back Geneticist, molecular.  Pt likely to do well once pain is managed. Pt is currently refusing post acute rehab. Will follow acutely.    Follow Up Recommendations  Home health OT (depending on progress)   Equipment Recommendations  None recommended by OT    Recommendations for Other Services       Precautions / Restrictions Precautions Precautions: Fall;Back Precaution Comments: instructed in back precautions for protection/comfort Restrictions Weight Bearing Restrictions: No      Mobility Bed Mobility Overal bed mobility: Needs Assistance Bed Mobility: Rolling;Sidelying to Sit;Sit to Sidelying Rolling: Supervision Sidelying to sit: Supervision;HOB elevated     Sit to sidelying: Supervision General bed mobility comments: Cues for log roll technique. Increased pain.  Transfers Overall transfer level: Needs assistance Equipment used: Rolling walker (2 wheeled) Transfers: Sit to/from Stand Sit to Stand: Supervision         General transfer comment: with RW    Balance Overall balance assessment: Needs assistance   Sitting balance-Leahy Scale: Fair       Standing balance-Leahy Scale: Fair                              ADL Overall ADL's : Needs assistance/impaired Eating/Feeding: Independent;Sitting   Grooming: Wash/dry hands;Standing;Minimal assistance Grooming Details (indicate cue type and reason): only able to tolerate one activity due to pain Upper Body Bathing: Sitting;Minimal assitance Upper Body Bathing Details (indicate cue type and reason): assist for back Lower Body Bathing: Sit to/from stand;Minimal assistance   Upper Body Dressing : Sitting;Minimal assistance   Lower Body Dressing: Sit to/from stand;Minimal assistance   Toilet Transfer: Supervision/safety;Ambulation;Regular Toilet   Toileting- Water quality scientist and Hygiene: Supervision/safety;Sit to/from stand       Functional mobility during ADLs: Rolling walker;Min guard       Vision     Perception     Praxis      Pertinent Vitals/Pain Pain Assessment: Faces Faces Pain Scale: Hurts worst Pain Location: L shoulder Pain Descriptors / Indicators: Grimacing;Guarding;Numbness;Radiating Pain Intervention(s): Limited activity within patient's tolerance;Monitored during session;Repositioned;Premedicated before session     Hand Dominance Right   Extremity/Trunk Assessment Upper Extremity Assessment Upper Extremity Assessment: LUE deficits/detail LUE Deficits / Details: full ROM, very painful shoulder, reported numbness when standing at sink   Lower Extremity Assessment Lower Extremity Assessment: Defer to PT evaluation   Cervical / Trunk Assessment Cervical / Trunk Assessment: Kyphotic   Communication Communication Communication: No difficulties   Cognition Arousal/Alertness: Awake/alert Behavior During Therapy: WFL for tasks assessed/performed Overall Cognitive Status: Within Functional Limits for tasks assessed                     General Comments  Exercises       Shoulder Instructions      Home Living Family/patient expects to be discharged to:: Private  residence Living Arrangements: Alone Available Help at Discharge: Available PRN/intermittently;Friend(s) Type of Home: Apartment Home Access: Level entry     Home Layout: One level     Bathroom Shower/Tub: Occupational psychologist: Handicapped height     Home Equipment: Environmental consultant - 2 wheels;Cane - single point;Shower seat          Prior Functioning/Environment Level of Independence: Independent with assistive device(s)        Comments: uses walker outside of home, nothing inside, pt was driving daily to [place] to see her husband who has been a pt for a year    OT Diagnosis: Generalized weakness;Acute pain   OT Problem List: Decreased strength;Decreased activity tolerance;Impaired balance (sitting and/or standing);Decreased knowledge of precautions;Pain   OT Treatment/Interventions: Self-care/ADL training;DME and/or AE instruction;Patient/family education    OT Goals(Current goals can be found in the care plan section) Acute Rehab OT Goals Patient Stated Goal: to make this pain go away OT Goal Formulation: With patient Potential to Achieve Goals: Fair ADL Goals Pt Will Perform Grooming: with modified independence;standing (3 activities) Pt Will Perform Upper Body Bathing: with modified independence;sitting Pt Will Perform Lower Body Bathing: with modified independence;sit to/from stand Pt Will Perform Upper Body Dressing: with modified independence;sitting Pt Will Perform Lower Body Dressing: with modified independence;sit to/from stand Pt Will Transfer to Toilet: with modified independence;ambulating;regular height toilet Pt Will Perform Toileting - Clothing Manipulation and hygiene: with modified independence;sit to/from stand Pt Will Perform Tub/Shower Transfer: Shower transfer;with supervision;ambulating;shower seat;rolling walker  OT Frequency: Min 2X/week   Barriers to D/C: Decreased caregiver support          Co-evaluation              End of  Session Equipment Utilized During Treatment: Surveyor, mining Communication:  (aware of pain)  Activity Tolerance: Patient limited by pain Patient left: in bed;with call bell/phone within reach;with family/visitor present;with nursing/sitter in room   Time: 1052-1120 OT Time Calculation (min): 28 min Charges:  OT General Charges $OT Visit: 1 Procedure OT Evaluation $Initial OT Evaluation Tier I: 1 Procedure OT Treatments $Self Care/Home Management : 8-22 mins G-Codes:    Malka So 09/06/2015, 11:35 AM  [phone]

## 2015-09-06 NOTE — Progress Notes (Signed)
  Subjective Patient examined and chest CT reviewed c-spine degeneration with severe pain, motor deficits Bi-basilar pneumonia L > R appears to be better with IV Levaquin Ascending aortic fusiform aneurysm remains at 4.4 cm w/o hematoma or ulceration and is not a contraindication for C-spine surgery  Objective: Vital signs in last 24 hours: Temp:  [97.8 F (36.6 C)-98.5 F (36.9 C)] 97.8 F (36.6 C) (10/25 0507) Pulse Rate:  [52-79] 76 (10/25 0507) Cardiac Rhythm:  [-] Normal sinus rhythm (10/25 0721) Resp:  [16-18] 18 (10/25 0507) BP: (115-132)/(64-88) 132/88 mmHg (10/25 0507) SpO2:  [95 %-97 %] 96 % (10/25 0507)  Hemodynamic parameters for last 24 hours:  afebrile nsr  Intake/Output from previous day: 10/24 0701 - 10/25 0700 In: 240 [P.O.:240] Out: -  Intake/Output this shift: Total I/O In: 236 [P.O.:236] Out: 300 [Urine:300]      Physical Exam  General: [name] comfortable supine in hospital bed, thin HEENT: Normocephalic pupils equal , dentition adequate Neck: Supple without JVD, adenopathy, or bruit Chest: Clear to auscultation, symmetrical breath sounds, no rhonchi, no tenderness             or deformity Cardiovascular: Regular rate and rhythm, no murmur, no gallop, peripheral pulses             palpable in all extremities Abdomen:  Soft, nontender, no palpable mass or organomegaly Extremities: Warm, well-perfused, no clubbing cyanosis edema or tenderness,              no venous stasis changes of the legs Rectal/GU: Deferred Neuro: Grossly non--focal and symmetrical throughout Skin: Clean and dry without rash or ulceration   Lab Results:  Recent Labs  09/04/15 0805 09/04/15 0839 09/05/15 0010  WBC 8.9  --  8.4  HGB 10.8* 13.3 9.3*  HCT 33.5* 39.0 28.9*  PLT 378  --  302   BMET:  Recent Labs  09/04/15 0805 09/04/15 0839 09/04/15 1203 09/05/15 0010  NA 132* 134*  --  134*  K 3.7 3.7  --  3.8  CL 98* 99*  --  103  CO2 26  --   --  25   GLUCOSE 132* 129*  --  96  BUN 27* 30*  --  22*  CREATININE 0.59 0.60 0.57 0.68  CALCIUM 8.9  --   --  8.3*    PT/INR: No results for input(s): LABPROT, INR in the last 72 hours. ABG    Component Value Date/Time   TCO2 24 09/04/2015 0839   CBG (last 3)  No results for input(s): GLUCAP in the last 72 hours.  Assessment/Plan:  severe C-spine degeneration Bi- basilar pneumonia - improved, CXR ordered in am  Stable ascending aortic fusiform aneurysm OK to proceed with surgerylater in week   LOS: 2 days    [name] 09/06/2015

## 2015-09-06 NOTE — Care Management Important Message (Signed)
Important Message  Patient Details  Name: [name] MRN: [id number] Date of Birth: [birth year]   Medicare Important Message Given:  Yes-second notification given    [name] May 09/06/2015, 2:01 PM

## 2015-09-07 ENCOUNTER — Inpatient Hospital Stay (HOSPITAL_COMMUNITY): Payer: Medicare Other

## 2015-09-07 ENCOUNTER — Other Ambulatory Visit (HOSPITAL_COMMUNITY): Payer: Self-pay | Admitting: Neurosurgery

## 2015-09-07 DIAGNOSIS — M5412 Radiculopathy, cervical region: Secondary | ICD-10-CM | POA: Insufficient documentation

## 2015-09-07 LAB — COMPREHENSIVE METABOLIC PANEL
ALT: 17 U/L (ref 14–54)
ANION GAP: 6 (ref 5–15)
AST: 19 U/L (ref 15–41)
Albumin: 2.6 g/dL — ABNORMAL LOW (ref 3.5–5.0)
Alkaline Phosphatase: 48 U/L (ref 38–126)
BILIRUBIN TOTAL: 0.6 mg/dL (ref 0.3–1.2)
BUN: 24 mg/dL — ABNORMAL HIGH (ref 6–20)
CHLORIDE: 97 mmol/L — AB (ref 101–111)
CO2: 27 mmol/L (ref 22–32)
Calcium: 8.7 mg/dL — ABNORMAL LOW (ref 8.9–10.3)
Creatinine, Ser: 0.82 mg/dL (ref 0.44–1.00)
GFR calc Af Amer: >60 mL/min (ref >60)
Glucose, Bld: 130 mg/dL — ABNORMAL HIGH (ref 65–99)
POTASSIUM: 4.3 mmol/L (ref 3.5–5.1)
Sodium: 130 mmol/L — ABNORMAL LOW (ref 135–145)
TOTAL PROTEIN: 5.3 g/dL — AB (ref 6.5–8.1)

## 2015-09-07 LAB — BASIC METABOLIC PANEL
ANION GAP: 8 (ref 5–15)
BUN: 31 mg/dL — ABNORMAL HIGH (ref 6–20)
CHLORIDE: 101 mmol/L (ref 101–111)
CO2: 25 mmol/L (ref 22–32)
Calcium: 9.5 mg/dL (ref 8.9–10.3)
Creatinine, Ser: 0.79 mg/dL (ref 0.44–1.00)
Glucose, Bld: 134 mg/dL — ABNORMAL HIGH (ref 65–99)
Potassium: 4.4 mmol/L (ref 3.5–5.1)
SODIUM: 134 mmol/L — AB (ref 135–145)

## 2015-09-07 LAB — CBC
HEMATOCRIT: 34.5 % — AB (ref 36.0–46.0)
Hemoglobin: 11 g/dL — ABNORMAL LOW (ref 12.0–15.0)
MCH: 21.9 pg — ABNORMAL LOW (ref 26.0–34.0)
MCHC: 31.9 g/dL (ref 30.0–36.0)
MCV: 68.7 fL — AB (ref 78.0–100.0)
PLATELETS: 324 10*3/uL (ref 150–400)
RBC: 5.02 MIL/uL (ref 3.87–5.11)
RDW: 18.9 % — AB (ref 11.5–15.5)
WBC: 8 10*3/uL (ref 4.0–10.5)

## 2015-09-07 MED ORDER — NA FERRIC GLUC CPLX IN SUCROSE 12.5 MG/ML IV SOLN
25.0000 mg | Freq: Once | INTRAVENOUS | Status: AC
  Administered 2015-09-07: 25 mg via INTRAVENOUS
  Filled 2015-09-07 (×2): qty 2

## 2015-09-07 MED ORDER — ENOXAPARIN SODIUM 30 MG/0.3ML ~~LOC~~ SOLN
30.0000 mg | SUBCUTANEOUS | Status: DC
  Administered 2015-09-07: 30 mg via SUBCUTANEOUS
  Filled 2015-09-07: qty 0.3

## 2015-09-07 MED ORDER — NA FERRIC GLUC CPLX IN SUCROSE 12.5 MG/ML IV SOLN
125.0000 mg | Freq: Once | INTRAVENOUS | Status: AC
  Administered 2015-09-07: 125 mg via INTRAVENOUS
  Filled 2015-09-07 (×2): qty 10

## 2015-09-07 MED ORDER — SENNOSIDES-DOCUSATE SODIUM 8.6-50 MG PO TABS
1.0000 | ORAL_TABLET | Freq: Two times a day (BID) | ORAL | Status: DC
  Administered 2015-09-07 – 2015-09-08 (×2): 1 via ORAL
  Filled 2015-09-07 (×2): qty 1

## 2015-09-07 MED ORDER — LEVOFLOXACIN 750 MG PO TABS
750.0000 mg | ORAL_TABLET | ORAL | Status: DC
  Administered 2015-09-08 – 2015-09-10 (×2): 750 mg via ORAL
  Filled 2015-09-07 (×2): qty 1

## 2015-09-07 MED ORDER — POLYETHYLENE GLYCOL 3350 17 G PO PACK: 17.0000 g | PACK | Freq: Two times a day (BID) | ORAL | Status: DC

## 2015-09-07 MED ORDER — HYDROCORTISONE ACETATE 25 MG RE SUPP
25.0000 mg | Freq: Two times a day (BID) | RECTAL | Status: DC
  Administered 2015-09-07 – 2015-09-12 (×9): 25 mg via RECTAL
  Filled 2015-09-07 (×10): qty 1

## 2015-09-07 NOTE — Progress Notes (Signed)
Physical Therapy Treatment Patient Details Name: [name] MRN: [id number] DOB: [birth year] Today's Date: 09/07/2015    History of Present Illness Patient is a 79 y/o female presents with left upper back pain and cough. CT scan of the chest showed multilevel bilateral lower lobes infiltrates. Neurosurgery following.  PMH includes multilevel DJD s/p kyphoplasty, breast cancer, DVT, thoracic aneurysm.    PT Comments    Patient continues to be very limited by pain. Plan is to have ACDF on Friday. Patient stated that she has people to assist at home. Will reassess mobility after surgery  Follow Up Recommendations  Home health PT;Supervision for mobility/OOB     Equipment Recommendations  None recommended by PT    Recommendations for Other Services       Precautions / Restrictions Precautions Precautions: Fall;Back Precaution Comments: instructed in back precautions for protection/comfort    Mobility  Bed Mobility Overal bed mobility: Needs Assistance Bed Mobility: Rolling;Sidelying to Sit;Sit to Sidelying Rolling: Supervision Sidelying to sit: Supervision;HOB elevated     Sit to sidelying: Supervision    Transfers Overall transfer level: Needs assistance Equipment used: Rolling walker (2 wheeled)   Sit to Stand: Min guard         General transfer comment: MG for safety. Patient stumbling slightly with stand. CUes for positioning prior to sitting  Ambulation/Gait Ambulation/Gait assistance: Min assist Ambulation Distance (Feet): 25 Feet Assistive device: None     Gait velocity interpretation: <1.8 ft/sec, indicative of risk for recurrent falls General Gait Details: Kyphotic posturing with forward flexion; furniture walking for support. Leaning on counter for support for pain control. One standing rest break. Could not ambulate any further due to pain   Stairs            Wheelchair Mobility    Modified Rankin (Stroke Patients Only)        Balance                                    Cognition Arousal/Alertness: Awake/alert Behavior During Therapy: WFL for tasks assessed/performed Overall Cognitive Status: Within Functional Limits for tasks assessed                      Exercises      General Comments        Pertinent Vitals/Pain Pain Score: 7  Pain Location: L shoulder Pain Descriptors / Indicators: Grimacing;Guarding Pain Intervention(s): Limited activity within patient's tolerance;Premedicated before session    Home Living                      Prior Function            PT Goals (current goals can now be found in the care plan section) Progress towards PT goals: Progressing toward goals    Frequency  Min 3X/week    PT Plan      Co-evaluation             End of Session   Activity Tolerance: Patient limited by pain Patient left: in bed;with call bell/phone within reach;with bed alarm set     Time: 2951-8841 PT Time Calculation (min) (ACUTE ONLY): 24 min  Charges:  $Gait Training: 8-22 mins $Therapeutic Activity: 8-22 mins                    G Codes:      Robinette, Gregary Signs  Elizabeth 09/07/2015, 1:11 PM 09/07/2015 Robinette, Tonia Brooms PTA

## 2015-09-07 NOTE — Progress Notes (Addendum)
Agitated about medicine admin times. States "I typically take my synthroid and Prilosec atleast an hour before eating in the mornings. That's probably why my bowels are messed up." Dr. Allyson Sabal aware of complaints of "hard stool" and will order stool softener. Also upset stating "If medicine is due at 1000, it should be brought it right on the dot. Should not be any time before or after." Will notify pharmacy and request time change for med admin if possible.

## 2015-09-07 NOTE — Progress Notes (Signed)
Agitated regarding not having prn morphine at exactly 1040. Explained that medicine is "as needed" for pain; educated regarding calling the nurse or other staff members when having pain.

## 2015-09-07 NOTE — Progress Notes (Signed)
[facility] PROGRESS NOTE  [name] HFW:[id number] DOB: 02-27-1936 DOA: 09/04/2015 PCP: [name], MD  Length of stay: 3   Assessment/Plan: Principal Problem:   CAP (community acquired pneumonia) Active Problems:   Rib fractures   Fracture lumbar vertebra-closed ([place])   History of DVT of lower extremity   Hypothyroidism   Community acquired pneumonia   Cervical radiculopathy     1. CAP (community acquired pneumonia) Continue levofloxacin, to be dosed by pharmacy, 7 days, pneumonia improving, continue supplemental O2, NEB breathing treatments, monitor CBC. Stable from a pulmonary standpoint to have surgery on Friday  2. Acute pain in the L scapular area, L upper arm area, history of multi level compression deformities with severe kyphoscoliosis: History of vertebroplasty of the T11- L1 level by Kristeen Miss, MD in  2014 Continue with IV Morphine PRN, patient takes Vicodin at home without relief, states she has tried Oxycodone which has made her too dizzy. Will add Lidoderm patch, add low dose PRN Oxycodone IR as well as Tylenol with Codeine for pain control.   Dr Donald Pore discussed MRI findings with the patient. Need surgery for C7-T1 disc extrusion causing cervical radiculopathy and symptoms of left upper extremity pain and numbness. Anticipated to have surgery on Friday. Patient has been started on Decadron IV for her pain.   3. Remote history of LLE DVT not currently on any anti coagulation: DVT prophylaxis with Lovenox.   4.   ascending thoracic aortic aneurysm.-Followed by Ivin Poot, MD. aneurysm without any hematoma ulceration, no contraindication for C-spine surgery per CTS note  5. Microcytic anemia, hemoglobin dropped from 13.3 -9.Marland Kitchen No obvious source of bleeding, replete iron, stool guaiac negative  6. Constipation, constipation protocol started on Anusol suppository  DVT prophylaxsis Lovenox, hold  after midnight on Thursday  Code Status:      Code Status Orders        Start     Ordered   09/04/15 1154  Full code   Continuous     09/04/15 1159    Advance Directive Documentation        Most Recent Value   Type of Advance Directive  Healthcare Power of Attorney, Living will   Pre-existing out of facility DNR order (yellow form or pink MOST form)     "MOST" Form in Place?       Family Communication: family updated about patient's clinical progress Disposition Plan: Anticipated to have surgery on Friday   Brief narrative: 79 y.o. female with a past medical history significant for multilevel degenerative joint disease status post kyphoplasty, remote history of breast cancer, history of gastroesophageal reflux disease, remote history of DVT in the past. The patient's husband was recently hospitalized for pneumonia, he is now currently in a facility for rehabilitation.  Over the course of the past 3-4 days, the patient has had a cough. Initially it was productive and she felt congested. Especially since the last 24 hours she has had a dry cough. She has significant pain in her shoulder/back/scapular area on the left side. Her neighbor brought her in to the emergency department today. She lives by herself currently [place].  The patient has a history of thoracic aneurysm. She is being observed by vascular surgery. The patient underwent workup in the emergency department with a CT scan of the chest showing stable thoracic aneurysm. Patient showed multilevel degenerative disease. CT scan of the chest also showed multilevel bilateral lower lobes  infiltrates. Patient denies any fevers denies any chills denies any shortness of breath. Denies any nausea or vomiting. She denies any chest discomfort. She has had her flu shot earlier this year.  In the emergency department, she has received IV Levaquin, for mail grams of IV morphine, by mouth Flexeril. A hospitalist  admission was requested for management of community-acquired pneumonia, acute on chronic pain secondary to her multilevel degenerative joint disease.  Consultants:  None  Procedures:  None  Antibiotics: Anti-infectives    Start     Dose/Rate Route Frequency Ordered Stop   09/08/15 1200  levofloxacin (LEVAQUIN) tablet 750 mg     750 mg Oral Every 48 hours 09/07/15 1127     09/06/15 1200  levofloxacin (LEVAQUIN) IVPB 750 mg  Status:  Discontinued     750 mg 100 mL/hr over 90 Minutes Intravenous Every 48 hours 09/05/15 0937 09/07/15 1126   09/04/15 1200  levofloxacin (LEVAQUIN) IVPB 750 mg  Status:  Discontinued     750 mg 100 mL/hr over 90 Minutes Intravenous Every 24 hours 09/04/15 1159 09/05/15 0937   09/04/15 1045  levofloxacin (LEVAQUIN) IVPB 750 mg  Status:  Discontinued     750 mg 100 mL/hr over 90 Minutes Intravenous  Once 09/04/15 1043 09/04/15 1305         HPI/Subjective: Patient complaining of being constipated, bleeding from her hemorrhoids, has a history of bladder prolapse, but no evidence of bladder prolapse on physical exam today  Objective: Filed Vitals:   09/06/15 1507 09/06/15 2119 09/06/15 2127 09/07/15 0657  BP: 120/79 117/72 109/65 115/82  Pulse: 83 91 92 91  Temp: 98.5 F (36.9 C) 98.4 F (36.9 C)  98.2 F (36.8 C)  TempSrc: Oral Oral  Oral  Resp: 20 14  16   Height:      Weight:      SpO2: 95% 97%  98%    Intake/Output Summary (Last 24 hours) at 09/07/15 1256 Last data filed at 09/07/15 0946  Gross per 24 hour  Intake    390 ml  Output      0 ml  Net    390 ml    Exam:  General: No acute respiratory distress Lungs: Clear to auscultation bilaterally without wheezes or crackles Cardiovascular: Regular rate and rhythm without murmur gallop or rub normal S1 and S2 Abdomen: Nontender, nondistended, soft, bowel sounds positive, no rebound, no ascites, no appreciable mass Extremities: No significant cyanosis, clubbing, or edema bilateral  lower extremities     Data Review   Micro Results Recent Results (from the past 240 hour(s))  Blood culture (routine x 2)     Status: None (Preliminary result)   Collection Time: 09/04/15 10:50 AM  Result Value Ref Range Status   Specimen Description BLOOD LEFT ARM  Final   Special Requests BOTTLES DRAWN AEROBIC ONLY 6CC  Final   Culture NO GROWTH 2 DAYS  Final   Report Status PENDING  Incomplete  Blood culture (routine x 2)     Status: None (Preliminary result)   Collection Time: 09/04/15 10:55 AM  Result Value Ref Range Status   Specimen Description BLOOD BLOOD LEFT FOREARM  Final   Special Requests BOTTLES DRAWN AEROBIC ONLY 6CC  Final   Culture NO GROWTH 2 DAYS  Final   Report Status PENDING  Incomplete  Urine culture     Status: None   Collection Time: 09/04/15  2:04 PM  Result Value Ref Range Status   Specimen Description URINE, RANDOM  Final   Special Requests NONE  Final   Culture NO GROWTH 1 DAY  Final   Report Status 09/05/2015 FINAL  Final    Radiology Reports Dg Chest 2 View  09/07/2015  CLINICAL DATA:  Preoperative evaluation for upcoming neck surgery EXAM: CHEST - 2 VIEW COMPARISON:  09/04/2015 FINDINGS: Cardiac shadow is mildly enlarged. A hiatal hernia is again noted. The lungs are well aerated bilaterally. No focal confluent infiltrate is seen. Changes of prior vertebral augmentation are noted. Multiple compression deformities are again seen. IMPRESSION: No acute abnormality noted. Electronically Signed   By: Inez Catalina M.D.   On: 09/07/2015 08:08   Ct Angio Chest Pe W/cm &/or Wo Cm  09/04/2015  CLINICAL DATA:  Left scapular pain x 2 days, upper back pain, history of breast cancer status post right lumpectomy. EXAM: CT ANGIOGRAPHY CHEST WITH CONTRAST TECHNIQUE: Multidetector CT imaging of the chest was performed using the standard protocol during bolus administration of intravenous contrast. Multiplanar CT image reconstructions and MIPs were obtained to  evaluate the vascular anatomy. CONTRAST:  28mL OMNIPAQUE IOHEXOL 350 MG/ML SOLN COMPARISON:  CTA chest dated 04/06/2015 FINDINGS: No evidence pulmonary embolism. Stable 4.3 cm ascending thoracic aortic aneurysm (series 5/image 66). Mediastinum/Nodes: The heart is normal in size. No pericardial effusion. Coronary atherosclerosis. Atherosclerotic calcifications of the aortic arch. Visualized thyroid is unremarkable. Lungs/Pleura: Multifocal patchy opacities in the bilateral lower lobes, new from May 2016, suspicious for multifocal pneumonia. Dominant opacity measures 1.5 x 3.9 cm in the superior segment left lower lobe (series 6/ image 25). No pleural effusion or pneumothorax. Upper abdomen: Visualized upper abdomen is notable for a moderate hiatal hernia/ intrathoracic stomach and vascular calcifications. Musculoskeletal: Multiple lower thoracolumbar compression fracture deformities with prior vertebral augmentation of two lower thoracic vertebral bodies. Refer to dedicated thoracic spine CT for full report. Review of the MIP images confirms the above findings. IMPRESSION: No evidence of pulmonary embolism. Stable 4.3 cm ascending thoracic aortic aneurysm. Multifocal patchy opacities in the bilateral lower lobes, new from May 2016, suspicious for multifocal pneumonia. Electronically Signed   By: Julian Hy M.D.   On: 09/04/2015 10:24   Ct Thoracic Spine Wo Contrast  09/04/2015  CLINICAL DATA:  Upper back/left scapular pain EXAM: CT THORACIC SPINE WITHOUT CONTRAST TECHNIQUE: Multidetector CT imaging of the thoracic spine was performed without intravenous contrast administration. Multiplanar CT image reconstructions were also generated. COMPARISON:  Concurrent CTA chest dated 09/04/2015. Prior CTA chest dated 04/06/2015. FINDINGS: Refer to CTA chest report for pertinent vascular and pulmonary findings. Exaggerated thoracic kyphosis. Prior vertebral augmentation at T11 and L1, unchanged. Multilevel  thoracolumbar compression fracture deformities, unchanged from 04/06/2015, including: --Mild compression fracture deformities at T8 and L2 --Moderate compression fracture deformities at L3 (included on CTA chest) --Moderate to severe compression fracture deformities at T9 and T12 Moderate multilevel degenerative changes. No retropulsion. No evidence of acute fracture or dislocation. Left scapula is intact. IMPRESSION: Multilevel thoracolumbar compression fracture deformities, unchanged from May 2016, as described above. Prior vertebral augmentation at T11 and L1. No evidence of acute fracture or dislocation. Left scapula is intact. Refer to CTA chest report for pertinent vascular and pulmonary findings. Electronically Signed   By: Julian Hy M.D.   On: 09/04/2015 10:36   Mr Cervical Spine Wo Contrast  09/05/2015  CLINICAL DATA:  79 year old female with acute onset pain radiating to the left shoulder and scapula. Coughing for 4 days. Initial encounter. Multilevel thoracic compression fractures with some previous augmentation. Thoracic aortic  aneurysm. EXAM: MRI CERVICAL SPINE WITHOUT CONTRAST TECHNIQUE: Multiplanar, multisequence MR imaging of the cervical spine was performed. No intravenous contrast was administered. COMPARISON:  Thoracic spine CT 09/04/2015. FINDINGS: Preserved cervical lordosis. Mild retrolisthesis at C5-C6. Chronic disc and endplate degeneration at that level and C6-C7. No marrow edema or evidence of acute osseous abnormality. Cervicomedullary junction is within normal limits. Patchy T2 hyperintensity in the pons. Grossly negative visualized brain parenchyma otherwise. No spinal cord signal abnormality. Negative cervical paraspinal soft tissues. C2-C3: Left paracentral disc osteophyte complex, no significant stenosis. C3-C4: Left uncovertebral hypertrophy. Mild facet and ligament flavum hypertrophy. No spinal stenosis. Moderate left C4 foraminal stenosis. C4-C5: Moderate facet  hypertrophy greater on the right. Mild uncovertebral and ligament flavum hypertrophy. No spinal stenosis. Mild left and moderate right C5 foraminal stenosis. C5-C6: Mild retrolisthesis. Moderate ligament flavum hypertrophy. Disc space loss with circumferential disc osteophyte complex. Mild spinal stenosis. No definite spinal cord mass effect. Uncovertebral hypertrophy. Severe left and mild to moderate right C6 foraminal stenosis. C6-C7: Disc space loss. Circumferential disc osteophyte complex with broad-based posterior component. Mild ligament flavum hypertrophy. Borderline to mild spinal stenosis with no cord mass effect. Uncovertebral hypertrophy. Mild C7 foraminal stenosis greater on the left. C7-T1: Trace anterolisthesis. Mild circumferential disc bulge. Dark T2 and intermediate to high 8 STIR signal filling defect in the left lateral recess and proximal left C8 neural foramen best seen on series 3, images 9 and 10 (also series 7, image 24). Mild facet and ligament flavum hypertrophy. No spinal stenosis. Negative right C8 neural foramen. No upper thoracic spinal stenosis. IMPRESSION: 1. C7-T1 disc extrusion into the left lateral recess and proximal neural foramen suspected. No significant spinal stenosis. Query left C8 radiculitis. 2. Mild multifactorial degenerative spinal stenosis at C5-C6 and C6-C7. Chronic appearing multifactorial moderate or severe foraminal stenosis at the left C4, right C5, and bilateral C6 nerve levels. Electronically Signed   By: Genevie Ann M.D.   On: 09/05/2015 18:03   Dg Chest Port 1 View  09/04/2015  CLINICAL DATA:  Left shoulder pain and back pain 2 days. EXAM: PORTABLE CHEST 1 VIEW COMPARISON:  01/20/2013 FINDINGS: Patient is rotated to the left. Lungs are adequately inflated without consolidation or effusion. Mild stable cardiomegaly. Calcified plaque is present over the thoracic aorta. Evidence of known moderate size hiatal hernia. Stable curvature of the thoracic spine convex  right. Evidence of previous kyphoplasties over the mid to lower thoracic spine. IMPRESSION: No acute cardiopulmonary disease. Mild stable cardiomegaly. Hiatal hernia. Electronically Signed   By: Marin Olp M.D.   On: 09/04/2015 08:16     CBC  Recent Labs Lab 09/04/15 0805 09/04/15 0839 09/05/15 0010 09/07/15 0428  WBC 8.9  --  8.4 8.0  HGB 10.8* 13.3 9.3* 11.0*  HCT 33.5* 39.0 28.9* 34.5*  PLT 378  --  302 324  MCV 68.4*  --  68.5* 68.7*  MCH 22.0*  --  22.0* 21.9*  MCHC 32.2  --  32.2 31.9  RDW 19.0*  --  19.0* 18.9*  LYMPHSABS 0.6*  --   --   --   MONOABS 0.5  --   --   --   EOSABS 0.0  --   --   --   BASOSABS 0.0  --   --   --     Chemistries   Recent Labs Lab 09/04/15 0805 09/04/15 0839 09/04/15 1203 09/05/15 0010 09/07/15 0428  NA 132* 134*  --  134* 130*  K 3.7 3.7  --  3.8 4.3  CL 98* 99*  --  103 97*  CO2 26  --   --  25 27  GLUCOSE 132* 129*  --  96 130*  BUN 27* 30*  --  22* 24*  CREATININE 0.59 0.60 0.57 0.68 0.82  CALCIUM 8.9  --   --  8.3* 8.7*  AST 27  --   --   --  19  ALT 21  --   --   --  17  ALKPHOS 54  --   --   --  48  BILITOT 0.5  --   --   --  0.6   ------------------------------------------------------------------------------------------------------------------ estimated creatinine clearance is 41.3 mL/min (by C-G formula based on Cr of 0.82). ------------------------------------------------------------------------------------------------------------------ No results for input(s): HGBA1C in the last 72 hours. ------------------------------------------------------------------------------------------------------------------ No results for input(s): CHOL, HDL, LDLCALC, TRIG, CHOLHDL, LDLDIRECT in the last 72 hours. ------------------------------------------------------------------------------------------------------------------ No results for input(s): TSH, T4TOTAL, T3FREE, THYROIDAB in the last 72 hours.  Invalid input(s):  FREET3 ------------------------------------------------------------------------------------------------------------------  Recent Labs  09/06/15 1345  VITAMINB12 3525*  FOLATE 36.5  FERRITIN 9*  TIBC 361  IRON 19*  RETICCTPCT 1.7    Coagulation profile No results for input(s): INR, PROTIME in the last 168 hours.  No results for input(s): DDIMER in the last 72 hours.  Cardiac Enzymes  Recent Labs Lab 09/04/15 1203 09/04/15 1806 09/05/15 0010  TROPONINI <0.03 <0.03 <0.03   ------------------------------------------------------------------------------------------------------------------ Invalid input(s): POCBNP   CBG: No results for input(s): GLUCAP in the last 168 hours.     Studies: Dg Chest 2 View  09/07/2015  CLINICAL DATA:  Preoperative evaluation for upcoming neck surgery EXAM: CHEST - 2 VIEW COMPARISON:  09/04/2015 FINDINGS: Cardiac shadow is mildly enlarged. A hiatal hernia is again noted. The lungs are well aerated bilaterally. No focal confluent infiltrate is seen. Changes of prior vertebral augmentation are noted. Multiple compression deformities are again seen. IMPRESSION: No acute abnormality noted. Electronically Signed   By: Inez Catalina M.D.   On: 09/07/2015 08:08   Mr Cervical Spine Wo Contrast  09/05/2015  CLINICAL DATA:  79 year old female with acute onset pain radiating to the left shoulder and scapula. Coughing for 4 days. Initial encounter. Multilevel thoracic compression fractures with some previous augmentation. Thoracic aortic aneurysm. EXAM: MRI CERVICAL SPINE WITHOUT CONTRAST TECHNIQUE: Multiplanar, multisequence MR imaging of the cervical spine was performed. No intravenous contrast was administered. COMPARISON:  Thoracic spine CT 09/04/2015. FINDINGS: Preserved cervical lordosis. Mild retrolisthesis at C5-C6. Chronic disc and endplate degeneration at that level and C6-C7. No marrow edema or evidence of acute osseous abnormality. Cervicomedullary  junction is within normal limits. Patchy T2 hyperintensity in the pons. Grossly negative visualized brain parenchyma otherwise. No spinal cord signal abnormality. Negative cervical paraspinal soft tissues. C2-C3: Left paracentral disc osteophyte complex, no significant stenosis. C3-C4: Left uncovertebral hypertrophy. Mild facet and ligament flavum hypertrophy. No spinal stenosis. Moderate left C4 foraminal stenosis. C4-C5: Moderate facet hypertrophy greater on the right. Mild uncovertebral and ligament flavum hypertrophy. No spinal stenosis. Mild left and moderate right C5 foraminal stenosis. C5-C6: Mild retrolisthesis. Moderate ligament flavum hypertrophy. Disc space loss with circumferential disc osteophyte complex. Mild spinal stenosis. No definite spinal cord mass effect. Uncovertebral hypertrophy. Severe left and mild to moderate right C6 foraminal stenosis. C6-C7: Disc space loss. Circumferential disc osteophyte complex with broad-based posterior component. Mild ligament flavum hypertrophy. Borderline to mild spinal stenosis with no cord mass effect. Uncovertebral hypertrophy. Mild C7 foraminal stenosis greater on the left. C7-T1: Trace  anterolisthesis. Mild circumferential disc bulge. Dark T2 and intermediate to high 8 STIR signal filling defect in the left lateral recess and proximal left C8 neural foramen best seen on series 3, images 9 and 10 (also series 7, image 24). Mild facet and ligament flavum hypertrophy. No spinal stenosis. Negative right C8 neural foramen. No upper thoracic spinal stenosis. IMPRESSION: 1. C7-T1 disc extrusion into the left lateral recess and proximal neural foramen suspected. No significant spinal stenosis. Query left C8 radiculitis. 2. Mild multifactorial degenerative spinal stenosis at C5-C6 and C6-C7. Chronic appearing multifactorial moderate or severe foraminal stenosis at the left C4, right C5, and bilateral C6 nerve levels. Electronically Signed   By: Genevie Ann M.D.   On:  09/05/2015 18:03      Lab Results  Component Value Date   HGBA1C  12/01/2010    5.4 (NOTE)                                                                       According to the ADA Clinical Practice Recommendations for 2011, when HbA1c is used as a screening test:   >=6.5%   Diagnostic of Diabetes Mellitus           (if abnormal result  is confirmed)  5.7-6.4%   Increased risk of developing Diabetes Mellitus  References:Diagnosis and Classification of Diabetes Mellitus,Diabetes KZLD,3570,17(BLTJQ 1):S62-S69 and Standards of Medical Care in         Diabetes - 2011,Diabetes ZESP,2330,07  (Suppl 1):S11-S61.   Lab Results  Component Value Date   [facility]  12/02/2010    80        Total Cholesterol/HDL:CHD Risk Coronary Heart Disease Risk Table                     Men   Women  1/2 Average Risk   3.4   3.3  Average Risk       5.0   4.4  2 X Average Risk   9.6   7.1  3 X Average Risk  23.4   11.0        Use the calculated Patient Ratio above and the CHD Risk Table to determine the patient's CHD Risk.        ATP III CLASSIFICATION (LDL):  <100     mg/dL   Optimal  100-129  mg/dL   Near or Above                    Optimal  130-159  mg/dL   Borderline  160-189  mg/dL   High  >190     mg/dL   Very High   CREATININE 0.82 09/07/2015       Scheduled Meds: . amLODipine  5 mg Oral Daily  . aspirin EC  81 mg Oral Daily  . cholecalciferol  1,000 Units Oral Daily  . darifenacin  7.5 mg Oral Daily  . dexamethasone  4 mg Intravenous Q12H  . enoxaparin (LOVENOX) injection  30 mg Subcutaneous Q24H  . ferric gluconate (FERRLECIT/NULECIT) IV  125 mg Intravenous Once  . hydrocortisone  25 mg Rectal BID  . Influenza vac split quadrivalent PF  0.5 mL Intramuscular Tomorrow-1000  . [START ON 09/08/2015] levofloxacin  750 mg Oral Q48H  . levothyroxine  112 mcg Oral QAC breakfast  . lidocaine  1 patch Transdermal Q24H  . metoprolol succinate  25 mg Oral Daily  . multivitamin  1 tablet Oral  Daily  . omeprazole  20 mg Oral Daily  . pneumococcal 23 valent vaccine  0.5 mL Intramuscular Tomorrow-1000  . polyethylene glycol  17 g Oral BID  . pravastatin  20 mg Oral QPM   Continuous Infusions:   Principal Problem:   CAP (community acquired pneumonia) Active Problems:   Rib fractures   Fracture lumbar vertebra-closed ([place])   History of DVT of lower extremity   Hypothyroidism   Community acquired pneumonia   Cervical radiculopathy    Time spent: 71 minutes   [facility] Pager [phone]. If 7PM-7AM, please contact night-coverage at [email or web address], password [facility] 09/07/2015, 12:56 PM  LOS: 3 days

## 2015-09-07 NOTE — Progress Notes (Signed)
Subjective: Patient reports "I'm hurting again. She just gave me some medicine"  Objective: Vital signs in last 24 hours: Temp:  [98.2 F (36.8 C)-98.5 F (36.9 C)] 98.2 F (36.8 C) (10/26 0657) Pulse Rate:  [83-92] 91 (10/26 0657) Resp:  [14-20] 16 (10/26 0657) BP: (109-120)/(65-82) 115/82 mmHg (10/26 0657) SpO2:  [95 %-98 %] 98 % (10/26 0657)  Intake/Output from previous day: 10/25 0701 - 10/26 0700 In: 506 [P.O.:356; IV Piggyback:150] Out: 300 [Urine:300] Intake/Output this shift:    Alert, conversant. Left shoulder pain persists with intermittent LUE pain. Motor exam without change (weak left hand intrinsics). pt verballizes understanding of surgery plan and agrees to proceed.   Lab Results:  Recent Labs  09/05/15 0010 09/07/15 0428  WBC 8.4 8.0  HGB 9.3* 11.0*  HCT 28.9* 34.5*  PLT 302 324   BMET  Recent Labs  09/05/15 0010 09/07/15 0428  NA 134* 130*  K 3.8 4.3  CL 103 97*  CO2 25 27  GLUCOSE 96 130*  BUN 22* 24*  CREATININE 0.68 0.82  CALCIUM 8.3* 8.7*    Studies/Results: Mr Cervical Spine Wo Contrast  09/05/2015  CLINICAL DATA:  79 year old female with acute onset pain radiating to the left shoulder and scapula. Coughing for 4 days. Initial encounter. Multilevel thoracic compression fractures with some previous augmentation. Thoracic aortic aneurysm. EXAM: MRI CERVICAL SPINE WITHOUT CONTRAST TECHNIQUE: Multiplanar, multisequence MR imaging of the cervical spine was performed. No intravenous contrast was administered. COMPARISON:  Thoracic spine CT 09/04/2015. FINDINGS: Preserved cervical lordosis. Mild retrolisthesis at C5-C6. Chronic disc and endplate degeneration at that level and C6-C7. No marrow edema or evidence of acute osseous abnormality. Cervicomedullary junction is within normal limits. Patchy T2 hyperintensity in the pons. Grossly negative visualized brain parenchyma otherwise. No spinal cord signal abnormality. Negative cervical paraspinal soft  tissues. C2-C3: Left paracentral disc osteophyte complex, no significant stenosis. C3-C4: Left uncovertebral hypertrophy. Mild facet and ligament flavum hypertrophy. No spinal stenosis. Moderate left C4 foraminal stenosis. C4-C5: Moderate facet hypertrophy greater on the right. Mild uncovertebral and ligament flavum hypertrophy. No spinal stenosis. Mild left and moderate right C5 foraminal stenosis. C5-C6: Mild retrolisthesis. Moderate ligament flavum hypertrophy. Disc space loss with circumferential disc osteophyte complex. Mild spinal stenosis. No definite spinal cord mass effect. Uncovertebral hypertrophy. Severe left and mild to moderate right C6 foraminal stenosis. C6-C7: Disc space loss. Circumferential disc osteophyte complex with broad-based posterior component. Mild ligament flavum hypertrophy. Borderline to mild spinal stenosis with no cord mass effect. Uncovertebral hypertrophy. Mild C7 foraminal stenosis greater on the left. C7-T1: Trace anterolisthesis. Mild circumferential disc bulge. Dark T2 and intermediate to high 8 STIR signal filling defect in the left lateral recess and proximal left C8 neural foramen best seen on series 3, images 9 and 10 (also series 7, image 24). Mild facet and ligament flavum hypertrophy. No spinal stenosis. Negative right C8 neural foramen. No upper thoracic spinal stenosis. IMPRESSION: 1. C7-T1 disc extrusion into the left lateral recess and proximal neural foramen suspected. No significant spinal stenosis. Query left C8 radiculitis. 2. Mild multifactorial degenerative spinal stenosis at C5-C6 and C6-C7. Chronic appearing multifactorial moderate or severe foraminal stenosis at the left C4, right C5, and bilateral C6 nerve levels. Electronically Signed   By: Genevie Ann M.D.   On: 09/05/2015 18:03    Assessment/Plan:   LOS: 3 days  Continue tx of pain and pneumonia; ACDF C7-T1 planned for Friday. Discussion of surgery and peri-op/post-op instructions reviewed. Pt  verbalizes understanding.  Verdis Prime 09/07/2015, 8:07 AM

## 2015-09-08 ENCOUNTER — Other Ambulatory Visit: Payer: Self-pay | Admitting: Cardiothoracic Surgery

## 2015-09-08 DIAGNOSIS — I712 Thoracic aortic aneurysm, without rupture: Secondary | ICD-10-CM

## 2015-09-08 DIAGNOSIS — I7121 Aneurysm of the ascending aorta, without rupture: Secondary | ICD-10-CM

## 2015-09-08 LAB — CBC
HCT: 32.6 % — ABNORMAL LOW (ref 36.0–46.0)
HEMOGLOBIN: 10.5 g/dL — AB (ref 12.0–15.0)
MCH: 22.1 pg — ABNORMAL LOW (ref 26.0–34.0)
MCHC: 32.2 g/dL (ref 30.0–36.0)
MCV: 68.6 fL — ABNORMAL LOW (ref 78.0–100.0)
Platelets: 301 10*3/uL (ref 150–400)
RBC: 4.75 MIL/uL (ref 3.87–5.11)
RDW: 18.8 % — ABNORMAL HIGH (ref 11.5–15.5)
WBC: 9.8 10*3/uL (ref 4.0–10.5)

## 2015-09-08 MED ORDER — SENNA 8.6 MG PO TABS
2.0000 | ORAL_TABLET | Freq: Every day | ORAL | Status: AC
  Administered 2015-09-08 – 2015-09-11 (×3): 17.2 mg via ORAL
  Filled 2015-09-08 (×4): qty 2

## 2015-09-08 MED ORDER — VANCOMYCIN HCL IN DEXTROSE 1-5 GM/200ML-% IV SOLN
1000.0000 mg | INTRAVENOUS | Status: DC
  Filled 2015-09-08: qty 200

## 2015-09-08 MED ORDER — SENNOSIDES-DOCUSATE SODIUM 8.6-50 MG PO TABS: 2.0000 | ORAL_TABLET | Freq: Every morning | ORAL | Status: DC

## 2015-09-08 MED ORDER — POLYETHYLENE GLYCOL 3350 17 G PO PACK
17.0000 g | PACK | Freq: Two times a day (BID) | ORAL | Status: DC
  Administered 2015-09-08 – 2015-09-12 (×7): 17 g via ORAL
  Filled 2015-09-08 (×9): qty 1

## 2015-09-08 NOTE — Progress Notes (Signed)
[facility] PROGRESS NOTE  [name] LZJ:[id number] DOB: [birth year] DOA: 09/04/2015 PCP: [name], MD  Length of stay: 4   Assessment/Plan: Principal Problem:   CAP (community acquired pneumonia) Active Problems:   Rib fractures   Fracture lumbar vertebra-closed ([place])   History of DVT of lower extremity   Hypothyroidism   Community acquired pneumonia   Cervical radiculopathy     1. CAP (community acquired pneumonia) Continue levofloxacin, to be dosed by pharmacy, 7 days, pneumonia improving, continue supplemental O2, NEB breathing treatments, monitor CBC. Stable from a pulmonary standpoint to have surgery on Friday   2. Acute pain in the L scapular area, L upper arm area, history of multi level compression deformities with severe kyphoscoliosis: History of vertebroplasty of the T11- L1 level by Kristeen Miss, MD in  2014 Continue with IV Morphine PRN, patient takes Vicodin at home without relief, states she has tried Oxycodone which has made her too dizzy.  Cont  Lidoderm patch,  PRN Oxycodone IR as well as Tylenol with Codeine for pain control.   Dr Donald Pore discussed MRI findings with the patient. Need surgery for C7-T1 disc extrusion causing cervical radiculopathy and symptoms of left upper extremity pain and numbness. Anticipated to have surgery on Friday. Cont  Decadron IV for her pain.   3. Remote history of LLE DVT not currently on any anti coagulation: DVT prophylaxis with Lovenox.   4.   ascending thoracic aortic aneurysm.-Followed by Ivin Poot, MD. aneurysm without any hematoma ulceration, no contraindication for C-spine surgery per CTS note  5. Microcytic anemia, hemoglobin dropped from 13.3 > 10.5.. No obvious source of bleeding, replete iron, stool guaiac negative  6. Constipation, constipation protocol , patient requesting 2 tablets of Senokot, agreeable to start taking miralax today   7.  Rectal bleeding from internal hemorrhoids, started on Anusol HC  DVT prophylaxsis Lovenox, hold after midnight on Thursday  Code Status:      Code Status Orders        Start     Ordered   09/04/15 1154  Full code   Continuous     09/04/15 1159    Advance Directive Documentation        Most Recent Value   Type of Advance Directive  Healthcare Power of Attorney, Living will   Pre-existing out of facility DNR order (yellow form or pink MOST form)     "MOST" Form in Place?       Family Communication: family updated about patient's clinical progress Disposition Plan: Anticipated to have surgery on Friday   Brief narrative: 79 y.o. female with a past medical history significant for multilevel degenerative joint disease status post kyphoplasty, remote history of breast cancer, history of gastroesophageal reflux disease, remote history of DVT in the past. The patient's husband was recently hospitalized for pneumonia, he is now currently in a facility for rehabilitation.  Over the course of the past 3-4 days, the patient has had a cough. Initially it was productive and she felt congested. Especially since the last 24 hours she has had a dry cough. She has significant pain in her shoulder/back/scapular area on the left side. Her neighbor brought her in to the emergency department today. She lives by herself currently [place].  The patient has a history of thoracic aneurysm. She is being observed by vascular surgery. The patient underwent workup in the emergency department with a CT scan of the chest showing  stable thoracic aneurysm. Patient showed multilevel degenerative disease. CT scan of the chest also showed multilevel bilateral lower lobes infiltrates. Patient denies any fevers denies any chills denies any shortness of breath. Denies any nausea or vomiting. She denies any chest discomfort. She has had her flu shot earlier this year.  In the emergency department,  she has received IV Levaquin, for mail grams of IV morphine, by mouth Flexeril. A hospitalist admission was requested for management of community-acquired pneumonia, acute on chronic pain secondary to her multilevel degenerative joint disease.  Consultants:  None  Procedures:  None  Antibiotics: Anti-infectives    Start     Dose/Rate Route Frequency Ordered Stop   09/08/15 1200  levofloxacin (LEVAQUIN) tablet 750 mg     750 mg Oral Every 48 hours 09/07/15 1127     09/06/15 1200  levofloxacin (LEVAQUIN) IVPB 750 mg  Status:  Discontinued     750 mg 100 mL/hr over 90 Minutes Intravenous Every 48 hours 09/05/15 0937 09/07/15 1126   09/04/15 1200  levofloxacin (LEVAQUIN) IVPB 750 mg  Status:  Discontinued     750 mg 100 mL/hr over 90 Minutes Intravenous Every 24 hours 09/04/15 1159 09/05/15 0937   09/04/15 1045  levofloxacin (LEVAQUIN) IVPB 750 mg  Status:  Discontinued     750 mg 100 mL/hr over 90 Minutes Intravenous  Once 09/04/15 1043 09/04/15 1305         HPI/Subjective: Constipated, hemorrhoids are improving  Objective: Filed Vitals:   09/07/15 0657 09/07/15 1450 09/07/15 2125 09/08/15 0539  BP: 115/82 118/99 125/83 132/73  Pulse: 91 89 94 77  Temp: 98.2 F (36.8 C)  98.2 F (36.8 C) 97.6 F (36.4 C)  TempSrc: Oral  Oral Oral  Resp: 16 22  18   Height:      Weight:      SpO2: 98% 97% 96% 100%    Intake/Output Summary (Last 24 hours) at 09/08/15 1140 Last data filed at 09/07/15 1700  Gross per 24 hour  Intake    120 ml  Output      0 ml  Net    120 ml    Exam:  General: No acute respiratory distress Lungs: Clear to auscultation bilaterally without wheezes or crackles Cardiovascular: Regular rate and rhythm without murmur gallop or rub normal S1 and S2 Abdomen: Nontender, nondistended, soft, bowel sounds positive, no rebound, no ascites, no appreciable mass Extremities: No significant cyanosis, clubbing, or edema bilateral lower extremities     Data  Review   Micro Results Recent Results (from the past 240 hour(s))  Blood culture (routine x 2)     Status: None (Preliminary result)   Collection Time: 09/04/15 10:50 AM  Result Value Ref Range Status   Specimen Description BLOOD LEFT ARM  Final   Special Requests BOTTLES DRAWN AEROBIC ONLY 6CC  Final   Culture NO GROWTH 4 DAYS  Final   Report Status PENDING  Incomplete  Blood culture (routine x 2)     Status: None (Preliminary result)   Collection Time: 09/04/15 10:55 AM  Result Value Ref Range Status   Specimen Description BLOOD BLOOD LEFT FOREARM  Final   Special Requests BOTTLES DRAWN AEROBIC ONLY 6CC  Final   Culture NO GROWTH 4 DAYS  Final   Report Status PENDING  Incomplete  Urine culture     Status: None   Collection Time: 09/04/15  2:04 PM  Result Value Ref Range Status   Specimen Description URINE, RANDOM  Final  Special Requests NONE  Final   Culture NO GROWTH 1 DAY  Final   Report Status 09/05/2015 FINAL  Final    Radiology Reports Dg Chest 2 View  09/07/2015  CLINICAL DATA:  Preoperative evaluation for upcoming neck surgery EXAM: CHEST - 2 VIEW COMPARISON:  09/04/2015 FINDINGS: Cardiac shadow is mildly enlarged. A hiatal hernia is again noted. The lungs are well aerated bilaterally. No focal confluent infiltrate is seen. Changes of prior vertebral augmentation are noted. Multiple compression deformities are again seen. IMPRESSION: No acute abnormality noted. Electronically Signed   By: Inez Catalina M.D.   On: 09/07/2015 08:08   Ct Angio Chest Pe W/cm &/or Wo Cm  09/04/2015  CLINICAL DATA:  Left scapular pain x 2 days, upper back pain, history of breast cancer status post right lumpectomy. EXAM: CT ANGIOGRAPHY CHEST WITH CONTRAST TECHNIQUE: Multidetector CT imaging of the chest was performed using the standard protocol during bolus administration of intravenous contrast. Multiplanar CT image reconstructions and MIPs were obtained to evaluate the vascular anatomy.  CONTRAST:  30mL OMNIPAQUE IOHEXOL 350 MG/ML SOLN COMPARISON:  CTA chest dated 04/06/2015 FINDINGS: No evidence pulmonary embolism. Stable 4.3 cm ascending thoracic aortic aneurysm (series 5/image 66). Mediastinum/Nodes: The heart is normal in size. No pericardial effusion. Coronary atherosclerosis. Atherosclerotic calcifications of the aortic arch. Visualized thyroid is unremarkable. Lungs/Pleura: Multifocal patchy opacities in the bilateral lower lobes, new from May 2016, suspicious for multifocal pneumonia. Dominant opacity measures 1.5 x 3.9 cm in the superior segment left lower lobe (series 6/ image 25). No pleural effusion or pneumothorax. Upper abdomen: Visualized upper abdomen is notable for a moderate hiatal hernia/ intrathoracic stomach and vascular calcifications. Musculoskeletal: Multiple lower thoracolumbar compression fracture deformities with prior vertebral augmentation of two lower thoracic vertebral bodies. Refer to dedicated thoracic spine CT for full report. Review of the MIP images confirms the above findings. IMPRESSION: No evidence of pulmonary embolism. Stable 4.3 cm ascending thoracic aortic aneurysm. Multifocal patchy opacities in the bilateral lower lobes, new from May 2016, suspicious for multifocal pneumonia. Electronically Signed   By: Julian Hy M.D.   On: 09/04/2015 10:24   Ct Thoracic Spine Wo Contrast  09/04/2015  CLINICAL DATA:  Upper back/left scapular pain EXAM: CT THORACIC SPINE WITHOUT CONTRAST TECHNIQUE: Multidetector CT imaging of the thoracic spine was performed without intravenous contrast administration. Multiplanar CT image reconstructions were also generated. COMPARISON:  Concurrent CTA chest dated 09/04/2015. Prior CTA chest dated 04/06/2015. FINDINGS: Refer to CTA chest report for pertinent vascular and pulmonary findings. Exaggerated thoracic kyphosis. Prior vertebral augmentation at T11 and L1, unchanged. Multilevel thoracolumbar compression fracture  deformities, unchanged from 04/06/2015, including: --Mild compression fracture deformities at T8 and L2 --Moderate compression fracture deformities at L3 (included on CTA chest) --Moderate to severe compression fracture deformities at T9 and T12 Moderate multilevel degenerative changes. No retropulsion. No evidence of acute fracture or dislocation. Left scapula is intact. IMPRESSION: Multilevel thoracolumbar compression fracture deformities, unchanged from May 2016, as described above. Prior vertebral augmentation at T11 and L1. No evidence of acute fracture or dislocation. Left scapula is intact. Refer to CTA chest report for pertinent vascular and pulmonary findings. Electronically Signed   By: Julian Hy M.D.   On: 09/04/2015 10:36   Mr Cervical Spine Wo Contrast  09/05/2015  CLINICAL DATA:  79 year old female with acute onset pain radiating to the left shoulder and scapula. Coughing for 4 days. Initial encounter. Multilevel thoracic compression fractures with some previous augmentation. Thoracic aortic aneurysm. EXAM: MRI  CERVICAL SPINE WITHOUT CONTRAST TECHNIQUE: Multiplanar, multisequence MR imaging of the cervical spine was performed. No intravenous contrast was administered. COMPARISON:  Thoracic spine CT 09/04/2015. FINDINGS: Preserved cervical lordosis. Mild retrolisthesis at C5-C6. Chronic disc and endplate degeneration at that level and C6-C7. No marrow edema or evidence of acute osseous abnormality. Cervicomedullary junction is within normal limits. Patchy T2 hyperintensity in the pons. Grossly negative visualized brain parenchyma otherwise. No spinal cord signal abnormality. Negative cervical paraspinal soft tissues. C2-C3: Left paracentral disc osteophyte complex, no significant stenosis. C3-C4: Left uncovertebral hypertrophy. Mild facet and ligament flavum hypertrophy. No spinal stenosis. Moderate left C4 foraminal stenosis. C4-C5: Moderate facet hypertrophy greater on the right. Mild  uncovertebral and ligament flavum hypertrophy. No spinal stenosis. Mild left and moderate right C5 foraminal stenosis. C5-C6: Mild retrolisthesis. Moderate ligament flavum hypertrophy. Disc space loss with circumferential disc osteophyte complex. Mild spinal stenosis. No definite spinal cord mass effect. Uncovertebral hypertrophy. Severe left and mild to moderate right C6 foraminal stenosis. C6-C7: Disc space loss. Circumferential disc osteophyte complex with broad-based posterior component. Mild ligament flavum hypertrophy. Borderline to mild spinal stenosis with no cord mass effect. Uncovertebral hypertrophy. Mild C7 foraminal stenosis greater on the left. C7-T1: Trace anterolisthesis. Mild circumferential disc bulge. Dark T2 and intermediate to high 8 STIR signal filling defect in the left lateral recess and proximal left C8 neural foramen best seen on series 3, images 9 and 10 (also series 7, image 24). Mild facet and ligament flavum hypertrophy. No spinal stenosis. Negative right C8 neural foramen. No upper thoracic spinal stenosis. IMPRESSION: 1. C7-T1 disc extrusion into the left lateral recess and proximal neural foramen suspected. No significant spinal stenosis. Query left C8 radiculitis. 2. Mild multifactorial degenerative spinal stenosis at C5-C6 and C6-C7. Chronic appearing multifactorial moderate or severe foraminal stenosis at the left C4, right C5, and bilateral C6 nerve levels. Electronically Signed   By: Genevie Ann M.D.   On: 09/05/2015 18:03   Dg Chest Port 1 View  09/04/2015  CLINICAL DATA:  Left shoulder pain and back pain 2 days. EXAM: PORTABLE CHEST 1 VIEW COMPARISON:  01/20/2013 FINDINGS: Patient is rotated to the left. Lungs are adequately inflated without consolidation or effusion. Mild stable cardiomegaly. Calcified plaque is present over the thoracic aorta. Evidence of known moderate size hiatal hernia. Stable curvature of the thoracic spine convex right. Evidence of previous  kyphoplasties over the mid to lower thoracic spine. IMPRESSION: No acute cardiopulmonary disease. Mild stable cardiomegaly. Hiatal hernia. Electronically Signed   By: Marin Olp M.D.   On: 09/04/2015 08:16     CBC  Recent Labs Lab 09/04/15 0805 09/04/15 0839 09/05/15 0010 09/07/15 0428 09/08/15 0458  WBC 8.9  --  8.4 8.0 9.8  HGB 10.8* 13.3 9.3* 11.0* 10.5*  HCT 33.5* 39.0 28.9* 34.5* 32.6*  PLT 378  --  302 324 301  MCV 68.4*  --  68.5* 68.7* 68.6*  MCH 22.0*  --  22.0* 21.9* 22.1*  MCHC 32.2  --  32.2 31.9 32.2  RDW 19.0*  --  19.0* 18.9* 18.8*  LYMPHSABS 0.6*  --   --   --   --   MONOABS 0.5  --   --   --   --   EOSABS 0.0  --   --   --   --   BASOSABS 0.0  --   --   --   --     Chemistries   Recent Labs Lab 09/04/15 0805 09/04/15 2993  09/04/15 1203 09/05/15 0010 09/07/15 0428 09/07/15 1347  NA 132* 134*  --  134* 130* 134*  K 3.7 3.7  --  3.8 4.3 4.4  CL 98* 99*  --  103 97* 101  CO2 26  --   --  25 27 25   GLUCOSE 132* 129*  --  96 130* 134*  BUN 27* 30*  --  22* 24* 31*  CREATININE 0.59 0.60 0.57 0.68 0.82 0.79  CALCIUM 8.9  --   --  8.3* 8.7* 9.5  AST 27  --   --   --  19  --   ALT 21  --   --   --  17  --   ALKPHOS 54  --   --   --  48  --   BILITOT 0.5  --   --   --  0.6  --    ------------------------------------------------------------------------------------------------------------------ estimated creatinine clearance is 42.3 mL/min (by C-G formula based on Cr of 0.79). ------------------------------------------------------------------------------------------------------------------ No results for input(s): HGBA1C in the last 72 hours. ------------------------------------------------------------------------------------------------------------------ No results for input(s): CHOL, HDL, LDLCALC, TRIG, CHOLHDL, LDLDIRECT in the last 72  hours. ------------------------------------------------------------------------------------------------------------------ No results for input(s): TSH, T4TOTAL, T3FREE, THYROIDAB in the last 72 hours.  Invalid input(s): FREET3 ------------------------------------------------------------------------------------------------------------------  Recent Labs  09/06/15 1345  VITAMINB12 3525*  FOLATE 36.5  FERRITIN 9*  TIBC 361  IRON 19*  RETICCTPCT 1.7    Coagulation profile No results for input(s): INR, PROTIME in the last 168 hours.  No results for input(s): DDIMER in the last 72 hours.  Cardiac Enzymes  Recent Labs Lab 09/04/15 1203 09/04/15 1806 09/05/15 0010  TROPONINI <0.03 <0.03 <0.03   ------------------------------------------------------------------------------------------------------------------ Invalid input(s): POCBNP   CBG: No results for input(s): GLUCAP in the last 168 hours.     Studies: Dg Chest 2 View  09/07/2015  CLINICAL DATA:  Preoperative evaluation for upcoming neck surgery EXAM: CHEST - 2 VIEW COMPARISON:  09/04/2015 FINDINGS: Cardiac shadow is mildly enlarged. A hiatal hernia is again noted. The lungs are well aerated bilaterally. No focal confluent infiltrate is seen. Changes of prior vertebral augmentation are noted. Multiple compression deformities are again seen. IMPRESSION: No acute abnormality noted. Electronically Signed   By: Inez Catalina M.D.   On: 09/07/2015 08:08      Lab Results  Component Value Date   HGBA1C  12/01/2010    5.4 (NOTE)                                                                       According to the ADA Clinical Practice Recommendations for 2011, when HbA1c is used as a screening test:   >=6.5%   Diagnostic of Diabetes Mellitus           (if abnormal result  is confirmed)  5.7-6.4%   Increased risk of developing Diabetes Mellitus  References:Diagnosis and Classification of Diabetes Mellitus,Diabetes  DDUK,0254,27(CWCBJ 1):S62-S69 and Standards of Medical Care in         Diabetes - 2011,Diabetes SEGB,1517,61  (Suppl 1):S11-S61.   Lab Results  Component Value Date   [facility]  12/02/2010    80        Total Cholesterol/HDL:CHD Risk Coronary Heart Disease Risk Table  Men   Women  1/2 Average Risk   3.4   3.3  Average Risk       5.0   4.4  2 X Average Risk   9.6   7.1  3 X Average Risk  23.4   11.0        Use the calculated Patient Ratio above and the CHD Risk Table to determine the patient's CHD Risk.        ATP III CLASSIFICATION (LDL):  <100     mg/dL   Optimal  100-129  mg/dL   Near or Above                    Optimal  130-159  mg/dL   Borderline  160-189  mg/dL   High  >190     mg/dL   Very High   CREATININE 0.79 09/07/2015       Scheduled Meds: . amLODipine  5 mg Oral Daily  . aspirin EC  81 mg Oral Daily  . cholecalciferol  1,000 Units Oral Daily  . darifenacin  7.5 mg Oral Daily  . dexamethasone  4 mg Intravenous Q12H  . hydrocortisone  25 mg Rectal BID  . Influenza vac split quadrivalent PF  0.5 mL Intramuscular Tomorrow-1000  . levofloxacin  750 mg Oral Q48H  . levothyroxine  112 mcg Oral QAC breakfast  . metoprolol succinate  25 mg Oral Daily  . multivitamin  1 tablet Oral Daily  . omeprazole  20 mg Oral Daily  . pneumococcal 23 valent vaccine  0.5 mL Intramuscular Tomorrow-1000  . polyethylene glycol  17 g Oral BID  . pravastatin  20 mg Oral QPM  . [START ON 09/09/2015] senna-docusate  2 tablet Oral q morning - 10a   Continuous Infusions:   Principal Problem:   CAP (community acquired pneumonia) Active Problems:   Rib fractures   Fracture lumbar vertebra-closed ([place])   History of DVT of lower extremity   Hypothyroidism   Community acquired pneumonia   Cervical radiculopathy    Time spent: 53 minutes   [facility] Pager [phone]. If 7PM-7AM, please contact night-coverage at [email or web address], password  [facility] 09/08/2015, 11:40 AM  LOS: 4 days

## 2015-09-08 NOTE — Progress Notes (Signed)
Subjective: Patient reports "I'm just hungry"  Objective: Vital signs in last 24 hours: Temp:  [97.6 F (36.4 C)-98.2 F (36.8 C)] 97.6 F (36.4 C) (10/27 0539) Pulse Rate:  [77-94] 77 (10/27 0539) Resp:  [18-22] 18 (10/27 0539) BP: (118-132)/(73-99) 132/73 mmHg (10/27 0539) SpO2:  [96 %-100 %] 100 % (10/27 0539)  Intake/Output from previous day: 10/26 0701 - 10/27 0700 In: 240 [P.O.:240] Out: -  Intake/Output this shift:    Alert, smiling, conversant. Intermittent LUE pain, with constant left scapula pain persists. Motor fxn without change: weak left hand intrinsics.  Lab Results:  Recent Labs  09/07/15 0428 09/08/15 0458  WBC 8.0 9.8  HGB 11.0* 10.5*  HCT 34.5* 32.6*  PLT 324 301   BMET  Recent Labs  09/07/15 0428 09/07/15 1347  NA 130* 134*  K 4.3 4.4  CL 97* 101  CO2 27 25  GLUCOSE 130* 134*  BUN 24* 31*  CREATININE 0.82 0.79  CALCIUM 8.7* 9.5    Studies/Results: Dg Chest 2 View  09/07/2015  CLINICAL DATA:  Preoperative evaluation for upcoming neck surgery EXAM: CHEST - 2 VIEW COMPARISON:  09/04/2015 FINDINGS: Cardiac shadow is mildly enlarged. A hiatal hernia is again noted. The lungs are well aerated bilaterally. No focal confluent infiltrate is seen. Changes of prior vertebral augmentation are noted. Multiple compression deformities are again seen. IMPRESSION: No acute abnormality noted. Electronically Signed   By: Inez Catalina M.D.   On: 09/07/2015 08:08    Assessment/Plan:   LOS: 4 days  Per DrStern, diet order corrected: heart healthy today, NPO after midnight tonight; stop Lovenox. Plan for ACDF C7-T1 tomorrow. Pt agrees.   Verdis Prime 09/08/2015, 8:35 AM

## 2015-09-08 NOTE — Progress Notes (Signed)
Patient expressed concerns about bowels moving before surgery, contacted MD, telephone order received.  Patient is happy with the outcome

## 2015-09-08 NOTE — Progress Notes (Signed)
Orthopedic Tech Progress Note Patient Details:  [name] 1936/04/25 [id number]  Ortho Devices Type of Ortho Device: Soft collar Ortho Device/Splint Interventions: Application   [name] 09/08/2015, 1:55 PM

## 2015-09-08 NOTE — Progress Notes (Signed)
Occupational Therapy Treatment Patient Details Name: ANALYSE ANGST MRN: [id number] DOB: 07/27/1936 Today's Date: 09/08/2015    History of present illness Patient is a 79 y/o female presents with left upper back pain and cough. CT scan of the chest showed multilevel bilateral lower lobes infiltrates. Neurosurgery following.  PMH includes multilevel DJD s/p kyphoplasty, breast cancer, DVT, thoracic aneurysm.   OT comments  Patient progressing well towards OT goals. She reports she has a planned surgery tomorrow ACDF C7-T1. OT will need post-op orders to resume OT.    Follow Up Recommendations  Home health OT    Equipment Recommendations  None recommended by OT    Recommendations for Other Services      Precautions / Restrictions Precautions Precautions: Fall;Back Precaution Comments: instructed in back precautions for protection/comfort Restrictions Weight Bearing Restrictions: No       Mobility Bed Mobility Overal bed mobility: Needs Assistance Bed Mobility: Rolling;Sidelying to Sit;Sit to Sidelying Rolling: Supervision Sidelying to sit: Supervision;HOB elevated     Sit to sidelying: Supervision General bed mobility comments: Cues for log roll technique. Increased pain.  Transfers Overall transfer level: Needs assistance Equipment used: Rolling walker (2 wheeled)   Sit to Stand: Min guard              Balance                                   ADL Overall ADL's : Needs assistance/impaired     Grooming: Wash/dry hands;Min Dispensing optician: Supervision/safety;Ambulation;Regular Toilet   Toileting- Water quality scientist and Hygiene: Supervision/safety;Sit to/from stand       Functional mobility during ADLs: Supervision/safety;Min guard;Rolling walker General ADL Comments: Patient reports she needed to toilet. Bed mobility supervision from [facility] elevated; ambulated with RW to/from bathroom. Performed  toileting task and stood to wash hands at the sink. Upon return to supine, she was able to use bedrails to scoot up in bed. Discussed with patient that we would communicate with physician about seeing her after her planned surgery for tomorrow.       Vision                     Perception     Praxis      Cognition   Behavior During Therapy: WFL for tasks assessed/performed Overall Cognitive Status: Within Functional Limits for tasks assessed                       Extremity/Trunk Assessment               Exercises     Shoulder Instructions       General Comments      Pertinent Vitals/ Pain       Pain Assessment: 0-10 Pain Score: 7  Pain Location: L shoulder/back Pain Descriptors / Indicators: Sharp;Sore;Aching Pain Intervention(s): Limited activity within patient's tolerance;Repositioned  Home Living                                          Prior Functioning/Environment              Frequency Min 2X/week     Progress Toward Goals  OT Goals(current  goals can now be found in the care plan section)  Progress towards OT goals: Progressing toward goals  Acute Rehab OT Goals Patient Stated Goal: to make this pain go away  Plan Discharge plan remains appropriate    Co-evaluation                 End of Session Equipment Utilized During Treatment: Rolling walker   Activity Tolerance Patient limited by pain   Patient Left in bed;with call bell/phone within reach;with SCD's reapplied   Nurse Communication          Time: 3612-2449 OT Time Calculation (min): 13 min  Charges: OT General Charges $OT Visit: 1 Procedure OT Treatments $Self Care/Home Management : 8-22 mins  Nekita Pita A 09/08/2015, 9:19 AM

## 2015-09-08 NOTE — Progress Notes (Signed)
Pt c/o vaginal dryness said she has vaginal cream that she use twice a week and has not been used since admission because she doesnt have it with her, MD paged waiting for his respond

## 2015-09-09 ENCOUNTER — Inpatient Hospital Stay (HOSPITAL_COMMUNITY): Payer: Medicare Other

## 2015-09-09 ENCOUNTER — Inpatient Hospital Stay (HOSPITAL_COMMUNITY): Payer: Medicare Other | Admitting: Certified Registered Nurse Anesthetist

## 2015-09-09 ENCOUNTER — Encounter (HOSPITAL_COMMUNITY)
Admission: EM | Disposition: A | Discharge status: Home Health | Payer: Self-pay | Source: Home / Self Care | Attending: Internal Medicine

## 2015-09-09 DIAGNOSIS — M5020 Other cervical disc displacement, unspecified cervical region: Secondary | ICD-10-CM | POA: Diagnosis present

## 2015-09-09 HISTORY — PX: ANTERIOR CERVICAL DECOMP/DISCECTOMY FUSION: SHX1161

## 2015-09-09 LAB — SURGICAL PCR SCREEN
MRSA, PCR: NEGATIVE
STAPHYLOCOCCUS AUREUS: POSITIVE — AB

## 2015-09-09 LAB — COMPREHENSIVE METABOLIC PANEL
ALT: 24 U/L (ref 14–54)
AST: 30 U/L (ref 15–41)
Albumin: 2.6 g/dL — ABNORMAL LOW (ref 3.5–5.0)
Alkaline Phosphatase: 44 U/L (ref 38–126)
Anion gap: 9 (ref 5–15)
BUN: 21 mg/dL — AB (ref 6–20)
CHLORIDE: 101 mmol/L (ref 101–111)
CO2: 26 mmol/L (ref 22–32)
CREATININE: 0.84 mg/dL (ref 0.44–1.00)
Calcium: 8.9 mg/dL (ref 8.9–10.3)
Glucose, Bld: 124 mg/dL — ABNORMAL HIGH (ref 65–99)
POTASSIUM: 4.5 mmol/L (ref 3.5–5.1)
SODIUM: 136 mmol/L (ref 135–145)
Total Bilirubin: 0.4 mg/dL (ref 0.3–1.2)
Total Protein: 5.3 g/dL — ABNORMAL LOW (ref 6.5–8.1)

## 2015-09-09 LAB — CBC
HCT: 33.4 % — ABNORMAL LOW (ref 36.0–46.0)
Hemoglobin: 10.8 g/dL — ABNORMAL LOW (ref 12.0–15.0)
MCH: 21.8 pg — ABNORMAL LOW (ref 26.0–34.0)
MCHC: 32.3 g/dL (ref 30.0–36.0)
MCV: 67.5 fL — ABNORMAL LOW (ref 78.0–100.0)
PLATELETS: 323 10*3/uL (ref 150–400)
RBC: 4.95 MIL/uL (ref 3.87–5.11)
RDW: 18.8 % — AB (ref 11.5–15.5)
WBC: 10.1 10*3/uL (ref 4.0–10.5)

## 2015-09-09 LAB — CULTURE, BLOOD (ROUTINE X 2)
CULTURE: NO GROWTH
Culture: NO GROWTH

## 2015-09-09 SURGERY — ANTERIOR CERVICAL DECOMPRESSION/DISCECTOMY FUSION 1 LEVEL
Anesthesia: General

## 2015-09-09 MED ORDER — ONDANSETRON HCL 4 MG/2ML IJ SOLN: 4.0000 mg | INTRAMUSCULAR | Status: DC | PRN

## 2015-09-09 MED ORDER — LIDOCAINE-EPINEPHRINE 1 %-1:100000 IJ SOLN
INTRAMUSCULAR | Status: DC | PRN
  Administered 2015-09-09: 4 mL

## 2015-09-09 MED ORDER — FENTANYL CITRATE (PF) 100 MCG/2ML IJ SOLN
25.0000 ug | INTRAMUSCULAR | Status: DC | PRN
  Administered 2015-09-09 (×2): 25 ug via INTRAVENOUS

## 2015-09-09 MED ORDER — METOPROLOL TARTRATE 1 MG/ML IV SOLN: 5.0000 mg | Freq: Once | INTRAVENOUS | Status: DC

## 2015-09-09 MED ORDER — PROMETHAZINE HCL 25 MG/ML IJ SOLN: 6.2500 mg | INTRAMUSCULAR | Status: DC | PRN

## 2015-09-09 MED ORDER — KCL IN DEXTROSE-NACL 20-5-0.45 MEQ/L-%-% IV SOLN
INTRAVENOUS | Status: DC
  Administered 2015-09-09: 75 mL/h via INTRAVENOUS
  Filled 2015-09-09 (×5): qty 1000

## 2015-09-09 MED ORDER — METHOCARBAMOL 1000 MG/10ML IJ SOLN
500.0000 mg | Freq: Four times a day (QID) | INTRAVENOUS | Status: DC | PRN
  Filled 2015-09-09: qty 5

## 2015-09-09 MED ORDER — DEXTROSE 5 % IV SOLN
10.0000 mg | INTRAVENOUS | Status: DC | PRN
  Administered 2015-09-09: 10 ug/min via INTRAVENOUS

## 2015-09-09 MED ORDER — DEXAMETHASONE SODIUM PHOSPHATE 4 MG/ML IJ SOLN
INTRAMUSCULAR | Status: AC
  Filled 2015-09-09: qty 2

## 2015-09-09 MED ORDER — ACETAMINOPHEN 325 MG PO TABS: 650.0000 mg | ORAL_TABLET | ORAL | Status: DC | PRN

## 2015-09-09 MED ORDER — LIDOCAINE HCL 4 % MT SOLN
OROMUCOSAL | Status: DC | PRN
  Administered 2015-09-09: 4 mL via TOPICAL

## 2015-09-09 MED ORDER — ACETAMINOPHEN 650 MG RE SUPP: 650.0000 mg | RECTAL | Status: DC | PRN

## 2015-09-09 MED ORDER — METHOCARBAMOL 500 MG PO TABS
500.0000 mg | ORAL_TABLET | Freq: Four times a day (QID) | ORAL | Status: DC | PRN
  Filled 2015-09-09: qty 1

## 2015-09-09 MED ORDER — PANTOPRAZOLE SODIUM 40 MG IV SOLR
40.0000 mg | Freq: Every day | INTRAVENOUS | Status: DC
  Administered 2015-09-09: 40 mg via INTRAVENOUS
  Filled 2015-09-09: qty 40

## 2015-09-09 MED ORDER — ROCURONIUM BROMIDE 50 MG/5ML IV SOLN
INTRAVENOUS | Status: AC
  Filled 2015-09-09: qty 1

## 2015-09-09 MED ORDER — MENTHOL 3 MG MT LOZG: 1.0000 | LOZENGE | OROMUCOSAL | Status: DC | PRN

## 2015-09-09 MED ORDER — BISACODYL 10 MG RE SUPP: 10.0000 mg | Freq: Every day | RECTAL | Status: DC | PRN

## 2015-09-09 MED ORDER — GLYCOPYRROLATE 0.2 MG/ML IJ SOLN
INTRAMUSCULAR | Status: DC | PRN
  Administered 2015-09-09: 0.4 mg via INTRAVENOUS

## 2015-09-09 MED ORDER — SUCCINYLCHOLINE CHLORIDE 20 MG/ML IJ SOLN
INTRAMUSCULAR | Status: AC
  Filled 2015-09-09: qty 1

## 2015-09-09 MED ORDER — PROPOFOL 10 MG/ML IV BOLUS
INTRAVENOUS | Status: DC | PRN
  Administered 2015-09-09: 150 mg via INTRAVENOUS

## 2015-09-09 MED ORDER — LIDOCAINE HCL (CARDIAC) 20 MG/ML IV SOLN
INTRAVENOUS | Status: AC
  Filled 2015-09-09: qty 5

## 2015-09-09 MED ORDER — ESTROGENS, CONJUGATED 0.625 MG/GM VA CREA
1.0000 | TOPICAL_CREAM | Freq: Every day | VAGINAL | Status: DC
  Administered 2015-09-09 – 2015-09-10 (×2): 1 via VAGINAL
  Filled 2015-09-09: qty 30

## 2015-09-09 MED ORDER — ONDANSETRON HCL 4 MG/2ML IJ SOLN
INTRAMUSCULAR | Status: AC
  Filled 2015-09-09: qty 2

## 2015-09-09 MED ORDER — OXYCODONE-ACETAMINOPHEN 5-325 MG PO TABS
1.0000 | ORAL_TABLET | ORAL | Status: DC | PRN
  Administered 2015-09-11 (×5): 1 via ORAL
  Filled 2015-09-09 (×5): qty 1

## 2015-09-09 MED ORDER — METOPROLOL SUCCINATE ER 25 MG PO TB24
25.0000 mg | ORAL_TABLET | Freq: Every day | ORAL | Status: DC
  Administered 2015-09-09 – 2015-09-12 (×4): 25 mg via ORAL
  Filled 2015-09-09 (×4): qty 1

## 2015-09-09 MED ORDER — SODIUM CHLORIDE 0.9 % IJ SOLN
3.0000 mL | Freq: Two times a day (BID) | INTRAMUSCULAR | Status: DC
  Administered 2015-09-10 – 2015-09-11 (×2): 3 mL via INTRAVENOUS

## 2015-09-09 MED ORDER — METOPROLOL TARTRATE 12.5 MG HALF TABLET: 12.5000 mg | ORAL_TABLET | Freq: Two times a day (BID) | ORAL | Status: DC

## 2015-09-09 MED ORDER — DEXAMETHASONE SODIUM PHOSPHATE 10 MG/ML IJ SOLN
INTRAMUSCULAR | Status: DC | PRN
  Administered 2015-09-09: 10 mg via INTRAVENOUS

## 2015-09-09 MED ORDER — FENTANYL CITRATE (PF) 250 MCG/5ML IJ SOLN
INTRAMUSCULAR | Status: AC
  Filled 2015-09-09: qty 5

## 2015-09-09 MED ORDER — MUPIROCIN 2 % EX OINT
1.0000 "application " | TOPICAL_OINTMENT | Freq: Two times a day (BID) | CUTANEOUS | Status: DC
  Administered 2015-09-09 – 2015-09-12 (×7): 1 via NASAL
  Filled 2015-09-09 (×5): qty 22

## 2015-09-09 MED ORDER — SUCCINYLCHOLINE CHLORIDE 20 MG/ML IJ SOLN
INTRAMUSCULAR | Status: DC | PRN
  Administered 2015-09-09: 80 mg via INTRAVENOUS

## 2015-09-09 MED ORDER — LACTATED RINGERS IV SOLN
INTRAVENOUS | Status: DC | PRN
  Administered 2015-09-09 (×2): via INTRAVENOUS

## 2015-09-09 MED ORDER — PROPOFOL 10 MG/ML IV BOLUS
INTRAVENOUS | Status: AC
  Filled 2015-09-09: qty 20

## 2015-09-09 MED ORDER — HYDROMORPHONE HCL 1 MG/ML IJ SOLN
0.5000 mg | INTRAMUSCULAR | Status: DC | PRN
  Administered 2015-09-09: 1 mg via INTRAVENOUS
  Filled 2015-09-09: qty 1

## 2015-09-09 MED ORDER — SODIUM CHLORIDE 0.9 % IV SOLN: 250.0000 mL | INTRAVENOUS | Status: DC

## 2015-09-09 MED ORDER — LIDOCAINE HCL (CARDIAC) 20 MG/ML IV SOLN
INTRAVENOUS | Status: DC | PRN
  Administered 2015-09-09: 40 mg via INTRAVENOUS

## 2015-09-09 MED ORDER — FENTANYL CITRATE (PF) 100 MCG/2ML IJ SOLN
INTRAMUSCULAR | Status: DC | PRN
  Administered 2015-09-09: 100 ug via INTRAVENOUS
  Administered 2015-09-09: 50 ug via INTRAVENOUS

## 2015-09-09 MED ORDER — NEOSTIGMINE METHYLSULFATE 10 MG/10ML IV SOLN
INTRAVENOUS | Status: DC | PRN
  Administered 2015-09-09: 3 mg via INTRAVENOUS

## 2015-09-09 MED ORDER — HEMOSTATIC AGENTS (NO CHARGE) OPTIME
TOPICAL | Status: DC | PRN
  Administered 2015-09-09: 1 via TOPICAL

## 2015-09-09 MED ORDER — SODIUM CHLORIDE 0.9 % IJ SOLN: 3.0000 mL | INTRAMUSCULAR | Status: DC | PRN

## 2015-09-09 MED ORDER — VANCOMYCIN HCL IN DEXTROSE 1-5 GM/200ML-% IV SOLN
INTRAVENOUS | Status: AC
  Administered 2015-09-09: 1000 mg via INTRAVENOUS
  Filled 2015-09-09: qty 200

## 2015-09-09 MED ORDER — ALUM & MAG HYDROXIDE-SIMETH 200-200-20 MG/5ML PO SUSP: 30.0000 mL | Freq: Four times a day (QID) | ORAL | Status: DC | PRN

## 2015-09-09 MED ORDER — PHENYLEPHRINE 40 MCG/ML (10ML) SYRINGE FOR IV PUSH (FOR BLOOD PRESSURE SUPPORT)
PREFILLED_SYRINGE | INTRAVENOUS | Status: AC
  Filled 2015-09-09: qty 10

## 2015-09-09 MED ORDER — FENTANYL CITRATE (PF) 100 MCG/2ML IJ SOLN
INTRAMUSCULAR | Status: AC
  Filled 2015-09-09: qty 2

## 2015-09-09 MED ORDER — THROMBIN 5000 UNITS EX SOLR
CUTANEOUS | Status: DC | PRN
  Administered 2015-09-09 (×2): 5000 [IU] via TOPICAL

## 2015-09-09 MED ORDER — VANCOMYCIN HCL IN DEXTROSE 750-5 MG/150ML-% IV SOLN
750.0000 mg | Freq: Once | INTRAVENOUS | Status: AC
Start: 2015-09-10 — End: 2015-09-10
  Administered 2015-09-10: 750 mg via INTRAVENOUS
  Filled 2015-09-09: qty 150

## 2015-09-09 MED ORDER — EPHEDRINE SULFATE 50 MG/ML IJ SOLN
INTRAMUSCULAR | Status: AC
  Filled 2015-09-09: qty 1

## 2015-09-09 MED ORDER — ONDANSETRON HCL 4 MG/2ML IJ SOLN
INTRAMUSCULAR | Status: DC | PRN
  Administered 2015-09-09: 4 mg via INTRAVENOUS

## 2015-09-09 MED ORDER — CHLORHEXIDINE GLUCONATE CLOTH 2 % EX PADS
6.0000 | MEDICATED_PAD | Freq: Every day | CUTANEOUS | Status: DC
  Administered 2015-09-09 – 2015-09-12 (×4): 6 via TOPICAL

## 2015-09-09 MED ORDER — ROCURONIUM BROMIDE 100 MG/10ML IV SOLN
INTRAVENOUS | Status: DC | PRN
  Administered 2015-09-09: 25 mg via INTRAVENOUS

## 2015-09-09 MED ORDER — POLYETHYLENE GLYCOL 3350 17 G PO PACK: 17.0000 g | PACK | Freq: Every day | ORAL | Status: DC | PRN

## 2015-09-09 MED ORDER — HYDROCODONE-ACETAMINOPHEN 5-325 MG PO TABS
1.0000 | ORAL_TABLET | ORAL | Status: DC | PRN
  Administered 2015-09-12 (×3): 1 via ORAL
  Filled 2015-09-09 (×3): qty 1

## 2015-09-09 MED ORDER — MIDAZOLAM HCL 2 MG/2ML IJ SOLN
INTRAMUSCULAR | Status: AC
  Filled 2015-09-09: qty 2

## 2015-09-09 MED ORDER — PHENOL 1.4 % MT LIQD: 1.0000 | OROMUCOSAL | Status: DC | PRN

## 2015-09-09 MED ORDER — DOCUSATE SODIUM 100 MG PO CAPS
100.0000 mg | ORAL_CAPSULE | Freq: Two times a day (BID) | ORAL | Status: DC
  Administered 2015-09-09 – 2015-09-12 (×6): 100 mg via ORAL
  Filled 2015-09-09 (×6): qty 1

## 2015-09-09 MED ORDER — BUPIVACAINE HCL (PF) 0.5 % IJ SOLN
INTRAMUSCULAR | Status: DC | PRN
  Administered 2015-09-09: 4 mL

## 2015-09-09 MED ORDER — 0.9 % SODIUM CHLORIDE (POUR BTL) OPTIME
TOPICAL | Status: DC | PRN
  Administered 2015-09-09: 1000 mL

## 2015-09-09 SURGICAL SUPPLY — 66 items
BENZOIN TINCTURE PRP APPL 2/3 (GAUZE/BANDAGES/DRESSINGS) IMPLANT
BIT DRILL NEURO 2X3.1 SFT TUCH (MISCELLANEOUS) ×1 IMPLANT
BIT DRILL POWER (BIT) ×1 IMPLANT
BLADE ULTRA TIP 2M (BLADE) IMPLANT
BNDG GAUZE ELAST 4 BULKY (GAUZE/BANDAGES/DRESSINGS) IMPLANT
BUR BARREL STRAIGHT FLUTE 4.0 (BURR) ×3 IMPLANT
CAGE COROENT SM 6X13X15 (Cage) ×3 IMPLANT
CANISTER SUCT 3000ML PPV (MISCELLANEOUS) ×3 IMPLANT
CLOSURE WOUND 1/2 X4 (GAUZE/BANDAGES/DRESSINGS)
COVER MAYO STAND STRL (DRAPES) ×3 IMPLANT
DECANTER SPIKE VIAL GLASS SM (MISCELLANEOUS) ×3 IMPLANT
DERMABOND ADVANCED (GAUZE/BANDAGES/DRESSINGS) ×2
DERMABOND ADVANCED .7 DNX12 (GAUZE/BANDAGES/DRESSINGS) ×1 IMPLANT
DRAPE LAPAROTOMY 100X72 PEDS (DRAPES) ×3 IMPLANT
DRAPE MICROSCOPE LEICA (MISCELLANEOUS) ×3 IMPLANT
DRAPE POUCH INSTRU U-SHP 10X18 (DRAPES) ×3 IMPLANT
DRAPE PROXIMA HALF (DRAPES) IMPLANT
DRILL BIT POWER (BIT) ×2
DRILL NEURO 2X3.1 SOFT TOUCH (MISCELLANEOUS) ×3
DRSG OPSITE POSTOP 3X4 (GAUZE/BANDAGES/DRESSINGS) ×3 IMPLANT
DURAPREP 6ML APPLICATOR 50/CS (WOUND CARE) ×3 IMPLANT
ELECT COATED BLADE 2.86 ST (ELECTRODE) ×3 IMPLANT
ELECT REM PT RETURN 9FT ADLT (ELECTROSURGICAL) ×3
ELECTRODE REM PT RTRN 9FT ADLT (ELECTROSURGICAL) ×1 IMPLANT
GAUZE SPONGE 4X4 12PLY STRL (GAUZE/BANDAGES/DRESSINGS) IMPLANT
GAUZE SPONGE 4X4 16PLY XRAY LF (GAUZE/BANDAGES/DRESSINGS) IMPLANT
GLOVE BIO SURGEON STRL SZ8 (GLOVE) ×3 IMPLANT
GLOVE BIOGEL PI IND STRL 8 (GLOVE) ×1 IMPLANT
GLOVE BIOGEL PI IND STRL 8.5 (GLOVE) ×1 IMPLANT
GLOVE BIOGEL PI INDICATOR 8 (GLOVE) ×2
GLOVE BIOGEL PI INDICATOR 8.5 (GLOVE) ×2
GLOVE ECLIPSE 8.0 STRL XLNG CF (GLOVE) ×3 IMPLANT
GLOVE EXAM NITRILE LRG STRL (GLOVE) IMPLANT
GLOVE EXAM NITRILE MD LF STRL (GLOVE) IMPLANT
GLOVE EXAM NITRILE XL STR (GLOVE) IMPLANT
GLOVE EXAM NITRILE XS STR PU (GLOVE) IMPLANT
GOWN STRL REUS W/ TWL LRG LVL3 (GOWN DISPOSABLE) IMPLANT
GOWN STRL REUS W/ TWL XL LVL3 (GOWN DISPOSABLE) IMPLANT
GOWN STRL REUS W/TWL 2XL LVL3 (GOWN DISPOSABLE) IMPLANT
GOWN STRL REUS W/TWL LRG LVL3 (GOWN DISPOSABLE)
GOWN STRL REUS W/TWL XL LVL3 (GOWN DISPOSABLE)
HALTER HD/CHIN CERV TRACTION D (MISCELLANEOUS) ×3 IMPLANT
KIT BASIN OR (CUSTOM PROCEDURE TRAY) ×3 IMPLANT
KIT ROOM TURNOVER OR (KITS) ×3 IMPLANT
NEEDLE HYPO 18GX1.5 BLUNT FILL (NEEDLE) IMPLANT
NEEDLE HYPO 25X1 1.5 SAFETY (NEEDLE) ×3 IMPLANT
NEEDLE SPNL 22GX3.5 QUINCKE BK (NEEDLE) ×3 IMPLANT
NS IRRIG 1000ML POUR BTL (IV SOLUTION) ×3 IMPLANT
PACK LAMINECTOMY NEURO (CUSTOM PROCEDURE TRAY) ×3 IMPLANT
PAD ARMBOARD 7.5X6 YLW CONV (MISCELLANEOUS) ×9 IMPLANT
PIN DISTRACTION 14MM (PIN) ×6 IMPLANT
PLATE ARCHON 1-LEVEL 22MM (Plate) ×3 IMPLANT
PUTTY DBX 1CC (Putty) ×3 IMPLANT
PUTTY DBX 1CC DEPUY (Putty) ×1 IMPLANT
RUBBERBAND STERILE (MISCELLANEOUS) ×6 IMPLANT
SCREW ARCHON SELFTAP 4.0X13 (Screw) ×12 IMPLANT
SPONGE INTESTINAL PEANUT (DISPOSABLE) ×3 IMPLANT
SPONGE SURGIFOAM ABS GEL SZ50 (HEMOSTASIS) ×3 IMPLANT
STAPLER SKIN PROX WIDE 3.9 (STAPLE) IMPLANT
STRIP CLOSURE SKIN 1/2X4 (GAUZE/BANDAGES/DRESSINGS) IMPLANT
SUT VIC AB 3-0 SH 8-18 (SUTURE) ×6 IMPLANT
SYR 3ML LL SCALE MARK (SYRINGE) IMPLANT
TOWEL OR 17X24 6PK STRL BLUE (TOWEL DISPOSABLE) ×3 IMPLANT
TOWEL OR 17X26 10 PK STRL BLUE (TOWEL DISPOSABLE) ×3 IMPLANT
TRAP SPECIMEN MUCOUS 40CC (MISCELLANEOUS) ×3 IMPLANT
WATER STERILE IRR 1000ML POUR (IV SOLUTION) ×3 IMPLANT

## 2015-09-09 NOTE — Op Note (Signed)
09/04/2015 - 09/09/2015  2:33 PM  PATIENT:  [name]  79 y.o. 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, cervical Radiculopathy, cervical spondylosis, scoliosis, osteoporosis  PROCEDURE:  Procedure(s): Anterior Cervical Decompression and Fusion Cervical seven-Thorasic one  (N/A) with PEEK cage, autograft, allograft, plate  SURGEON:  Surgeon(s) and Role:    * Erline Levine, MD - Primary  PHYSICIAN ASSISTANT:   ASSISTANTS: Poteat, RN   ANESTHESIA:   general  EBL:  Total I/O In: 1000 [I.V.:1000] Out: -   BLOOD ADMINISTERED:none  DRAINS: none   LOCAL MEDICATIONS USED:  LIDOCAINE   SPECIMEN:  No Specimen  DISPOSITION OF SPECIMEN:  N/A  COUNTS:  YES  TOURNIQUET:  * No tourniquets in log *  DICTATION: Patient is 79 year old female with subluxation of C 7 T 1 with HNP, spondylosis, disc degeneration, radiculopathy.  It was elected to take her to surgery for anterior cervical decompression and fusion C 7 T 1 level.  PROCEDURE: Patient was brought to operating room and following the smooth and uncomplicated induction of general endotracheal anesthesia her head was placed on a horseshoe head holder he was placed in 5 pounds of Holter traction and his anterior neck was prepped and draped in usual sterile fashion. C-arm was used intraoperatively. An incision was made on the left side of midline after infiltrating the skin and subcutaneous tissues with local lidocaine. The platysmal layer was incised and subplatysmal dissection was performed exposing the anterior border sternocleidomastoid muscle. Using blunt dissection the carotid sheath was kept lateral and trachea and esophagus kept medial exposing the anterior cervical spine. A bent spinal needle was placed it was felt to be the C 7 T 1 level and this was  confirmed on intraoperative x-ray. Longus coli muscles were taken down from the anterior cervical spine using electrocautery and key elevator and self-retaining retractor was placed exposing the C 7 T 1 level. The interspace was incised and a thorough discectomy was performed. Distraction pins were placed. Uncinate spurs and central spondylitic ridges were drilled down with a high-speed drill. The spinal cord dura and both C8 nerve roots were widely decompressed.  There was a considerable amount of disc material which was removed which was compressing the left C 8 nerve root.   Hemostasis was assured. After trial sizing a 6 mm lordotic allograft bone wedge was selected and packed with local autograft and DBM. This was tamped into position and countersunk appropriately. Distraction weight was removed. A 22 mm Nuvasive Archon anterior cervical plate was affixed to the cervical spine with 13 mm variable-angle screws 2 at C7, 2 at T 1. All screws were well-positioned and locking mechanisms were engaged. A final X ray was obtained which showed well positioned graft and anterior plate without complicating features. Soft tissues were inspected and found to be in good repair. The wound was irrigated. The platysma layer was closed with 3-0 Vicryl stitches and the skin was reapproximated with 3-0 Vicryl subcuticular stitches. The wound was dressed with Dermabond. Counts were correct at the end of the case. Patient was extubated and taken to recovery in stable and satisfactory condition.   PLAN OF CARE: Admit to inpatient   PATIENT DISPOSITION:  PACU - hemodynamically stable.   Delay start of Pharmacological VTE agent (>24hrs) due to surgical blood loss or risk of bleeding: yes

## 2015-09-09 NOTE — Anesthesia Postprocedure Evaluation (Signed)
  Anesthesia Post-op Note  Patient: [name]  Procedure(s) Performed: Procedure(s): Anterior Cervical Decompression and Fusion Cervical seven-Thorasic one  (N/A)  Patient Location: PACU  Anesthesia Type:General  Level of Consciousness: awake and alert   Airway and Oxygen Therapy: Patient Spontanous Breathing  Post-op Pain: mild  Post-op Assessment: Post-op Vital signs reviewed   LLE Sensation: No numbness   RLE Sensation: No numbness      Post-op Vital Signs: Reviewed  Last Vitals:  Filed Vitals:   09/09/15 1530  BP:   Pulse: 112  Temp:   Resp: 36    Complications: No apparent anesthesia complications

## 2015-09-09 NOTE — Anesthesia Procedure Notes (Signed)
Procedure Name: Intubation Date/Time: 09/09/2015 12:53 PM Performed by: Rejeana Brock L Pre-anesthesia Checklist: Patient identified, Emergency Drugs available, Suction available, Patient being monitored and Timeout performed Patient Re-evaluated:Patient Re-evaluated prior to inductionOxygen Delivery Method: Circle system utilized Preoxygenation: Pre-oxygenation with 100% oxygen Intubation Type: IV induction Laryngoscope Size: Mac and 3 Grade View: Grade II Tube type: Oral Tube size: 7.5 mm Number of attempts: 1 Airway Equipment and Method: Stylet Placement Confirmation: ETT inserted through vocal cords under direct vision,  positive ETCO2 and breath sounds checked- equal and bilateral Secured at: 22 cm Tube secured with: Tape Dental Injury: Teeth and Oropharynx as per pre-operative assessment  Difficulty Due To: Difficulty was anticipated

## 2015-09-09 NOTE — Anesthesia Preprocedure Evaluation (Addendum)
Anesthesia Evaluation  Patient identified by MRN, date of birth, ID band Patient awake    Reviewed: Allergy & Precautions, NPO status , Patient's Chart, lab work & pertinent test results, reviewed documented beta blocker date and time   Airway Mallampati: II  TM Distance: >3 FB Neck ROM: Full    Dental  (+) Teeth Intact, Dental Advisory Given   Pulmonary pneumonia, unresolved,    breath sounds clear to auscultation       Cardiovascular hypertension, Pt. on medications and Pt. on home beta blockers + Peripheral Vascular Disease (stable 4.4cm ascending aortic aneurysm)   Rhythm:Regular Rate:Normal     Neuro/Psych  Neuromuscular disease    GI/Hepatic Neg liver ROS, hiatal hernia, GERD  ,  Endo/Other  Hypothyroidism   Renal/GU negative Renal ROS     Musculoskeletal  (+) Fibromyalgia -  Abdominal   Peds  Hematology  (+) anemia ,   Anesthesia Other Findings   Reproductive/Obstetrics                          Lab Results  Component Value Date   WBC 10.1 09/09/2015   HGB 10.8* 09/09/2015   HCT 33.4* 09/09/2015   MCV 67.5* 09/09/2015   PLT 323 09/09/2015   Lab Results  Component Value Date   CREATININE 0.84 09/09/2015   BUN 21* 09/09/2015   NA 136 09/09/2015   K 4.5 09/09/2015   CL 101 09/09/2015   CO2 26 09/09/2015    Anesthesia Physical Anesthesia Plan  ASA: III  Anesthesia Plan: General   Post-op Pain Management:    Induction: Intravenous  Airway Management Planned: Oral ETT  Additional Equipment:   Intra-op Plan:   Post-operative Plan: Extubation in OR  Informed Consent: I have reviewed the patients History and Physical, chart, labs and discussed the procedure including the risks, benefits and alternatives for the proposed anesthesia with the patient or authorized representative who has indicated his/her understanding and acceptance.   Dental advisory given  Plan  Discussed with: CRNA  Anesthesia Plan Comments:         Anesthesia Quick Evaluation

## 2015-09-09 NOTE — Progress Notes (Signed)
[facility] PROGRESS NOTE  [name] VCB:[id number] DOB: 02-Feb-1936 DOA: 09/04/2015 PCP: [name], MD  Length of stay: 5   Assessment/Plan: Principal Problem:   CAP (community acquired pneumonia) Active Problems:   Rib fractures   Fracture lumbar vertebra-closed ([place])   History of DVT of lower extremity   Hypothyroidism   Community acquired pneumonia   Cervical radiculopathy     1. CAP (community acquired pneumonia) Continue levofloxacin, to be dosed by pharmacy, 7 days, pneumonia improving, continue supplemental O2, NEB breathing treatments, monitor CBC. Stable from a pulmonary standpoint to have surgery on Friday, can DC abx if F/U CXR is negative    2. Acute pain in the L scapular area, L upper arm area, history of multi level compression deformities with severe kyphoscoliosis: History of vertebroplasty of the T11- L1 level by Kristeen Miss, MD in  2014 Continue with IV Morphine PRN, patient takes Vicodin at home without relief, states she has tried Oxycodone which has made her too dizzy.  Cont  Lidoderm patch,  PRN Oxycodone IR as well as Tylenol with Codeine for pain control.   Dr Donald Pore discussed MRI findings with the patient. Need surgery for C7-T1 disc extrusion causing cervical radiculopathy and symptoms of left upper extremity pain and numbness.  Anticipated to have surgery today. DC Decadron    3. Remote history of LLE DVT not currently on any anti coagulation: DVT prophylaxis with Lovenox.   4.   ascending thoracic aortic aneurysm.-Followed by Ivin Poot, MD. aneurysm without any hematoma ulceration, no contraindication for C-spine surgery per CTS note  5. Microcytic anemia, hemoglobin dropped from 13.3 > 10.8.Marland Kitchen No obvious source of bleeding, replete iron, stool guaiac negative  6. Constipation, constipation protocol , patient requesting 2 tablets of Senokot, agreeable to start taking miralax  today   7. Rectal bleeding from internal hemorrhoids, continue on Anusol HC  DVT prophylaxsis resume Lovenox after surgery  Code Status:      Code Status Orders        Start     Ordered   09/04/15 1154  Full code   Continuous     09/04/15 1159    Advance Directive Documentation        Most Recent Value   Type of Advance Directive  Healthcare Power of Attorney, Living will   Pre-existing out of facility DNR order (yellow form or pink MOST form)     "MOST" Form in Place?       Family Communication: family updated about patient's clinical progress Disposition Plan: Anticipated to have surgery on Friday   Brief narrative: 79 y.o. female with a past medical history significant for multilevel degenerative joint disease status post kyphoplasty, remote history of breast cancer, history of gastroesophageal reflux disease, remote history of DVT in the past. The patient's husband was recently hospitalized for pneumonia, he is now currently in a facility for rehabilitation.  Over the course of the past 3-4 days, the patient has had a cough. Initially it was productive and she felt congested. Especially since the last 24 hours she has had a dry cough. She has significant pain in her shoulder/back/scapular area on the left side. Her neighbor brought her in to the emergency department today. She lives by herself currently [place].  The patient has a history of thoracic aneurysm. She is being observed by vascular surgery. The patient underwent workup in the emergency department with a CT scan of  the chest showing stable thoracic aneurysm. Patient showed multilevel degenerative disease. CT scan of the chest also showed multilevel bilateral lower lobes infiltrates. Patient denies any fevers denies any chills denies any shortness of breath. Denies any nausea or vomiting. She denies any chest discomfort. She has had her flu shot earlier this year.  In the emergency  department, she has received IV Levaquin, for mail grams of IV morphine, by mouth Flexeril. A hospitalist admission was requested for management of community-acquired pneumonia, acute on chronic pain secondary to her multilevel degenerative joint disease.  Consultants:  None  Procedures:  None  Antibiotics: Anti-infectives    Start     Dose/Rate Route Frequency Ordered Stop   09/09/15 1200  vancomycin (VANCOCIN) IVPB 1000 mg/200 mL premix     1,000 mg 200 mL/hr over 60 Minutes Intravenous To [facility] 09/08/15 1141 09/10/15 1200   09/08/15 1200  levofloxacin (LEVAQUIN) tablet 750 mg     750 mg Oral Every 48 hours 09/07/15 1127     09/06/15 1200  levofloxacin (LEVAQUIN) IVPB 750 mg  Status:  Discontinued     750 mg 100 mL/hr over 90 Minutes Intravenous Every 48 hours 09/05/15 0937 09/07/15 1126   09/04/15 1200  levofloxacin (LEVAQUIN) IVPB 750 mg  Status:  Discontinued     750 mg 100 mL/hr over 90 Minutes Intravenous Every 24 hours 09/04/15 1159 09/05/15 0937   09/04/15 1045  levofloxacin (LEVAQUIN) IVPB 750 mg  Status:  Discontinued     750 mg 100 mL/hr over 90 Minutes Intravenous  Once 09/04/15 1043 09/04/15 1305         HPI/Subjective: Constipation resolved,  Objective: Filed Vitals:   09/08/15 1449 09/08/15 2149 09/08/15 2331 09/09/15 0544  BP: 130/88 124/90 134/71 123/74  Pulse: 105 93 86 63  Temp: 98.3 F (36.8 C)  97.9 F (36.6 C) 97.8 F (36.6 C)  TempSrc: Oral  Oral Oral  Resp: 20  16 17   Height:      Weight:      SpO2: 99%  99% 99%    Intake/Output Summary (Last 24 hours) at 09/09/15 1116 Last data filed at 09/09/15 0911  Gross per 24 hour  Intake    320 ml  Output      0 ml  Net    320 ml    Exam:  General: No acute respiratory distress Lungs: Clear to auscultation bilaterally without wheezes or crackles Cardiovascular: Regular rate and rhythm without murmur gallop or rub normal S1 and S2 Abdomen: Nontender, nondistended, soft, bowel  sounds positive, no rebound, no ascites, no appreciable mass Extremities: No significant cyanosis, clubbing, or edema bilateral lower extremities     Data Review   Micro Results Recent Results (from the past 240 hour(s))  Blood culture (routine x 2)     Status: None (Preliminary result)   Collection Time: 09/04/15 10:50 AM  Result Value Ref Range Status   Specimen Description BLOOD LEFT ARM  Final   Special Requests BOTTLES DRAWN AEROBIC ONLY 6CC  Final   Culture NO GROWTH 4 DAYS  Final   Report Status PENDING  Incomplete  Blood culture (routine x 2)     Status: None (Preliminary result)   Collection Time: 09/04/15 10:55 AM  Result Value Ref Range Status   Specimen Description BLOOD BLOOD LEFT FOREARM  Final   Special Requests BOTTLES DRAWN AEROBIC ONLY 6CC  Final   Culture NO GROWTH 4 DAYS  Final   Report Status PENDING  Incomplete  Urine culture     Status: None   Collection Time: 09/04/15  2:04 PM  Result Value Ref Range Status   Specimen Description URINE, RANDOM  Final   Special Requests NONE  Final   Culture NO GROWTH 1 DAY  Final   Report Status 09/05/2015 FINAL  Final  Surgical pcr screen     Status: Abnormal   Collection Time: 09/09/15  3:49 AM  Result Value Ref Range Status   MRSA, PCR NEGATIVE NEGATIVE Final   Staphylococcus aureus POSITIVE (A) NEGATIVE Final    Comment:        The Xpert SA Assay (FDA approved for NASAL specimens in patients over 67 years of age), is one component of a comprehensive surveillance program.  Test performance has been validated by [facility] for patients greater than or equal to 32 year old. It is not intended to diagnose infection nor to guide or monitor treatment.     Radiology Reports Dg Chest 2 View  09/07/2015  CLINICAL DATA:  Preoperative evaluation for upcoming neck surgery EXAM: CHEST - 2 VIEW COMPARISON:  09/04/2015 FINDINGS: Cardiac shadow is mildly enlarged. A hiatal hernia is again noted. The lungs are well  aerated bilaterally. No focal confluent infiltrate is seen. Changes of prior vertebral augmentation are noted. Multiple compression deformities are again seen. IMPRESSION: No acute abnormality noted. Electronically Signed   By: Inez Catalina M.D.   On: 09/07/2015 08:08   Ct Angio Chest Pe W/cm &/or Wo Cm  09/04/2015  CLINICAL DATA:  Left scapular pain x 2 days, upper back pain, history of breast cancer status post right lumpectomy. EXAM: CT ANGIOGRAPHY CHEST WITH CONTRAST TECHNIQUE: Multidetector CT imaging of the chest was performed using the standard protocol during bolus administration of intravenous contrast. Multiplanar CT image reconstructions and MIPs were obtained to evaluate the vascular anatomy. CONTRAST:  12mL OMNIPAQUE IOHEXOL 350 MG/ML SOLN COMPARISON:  CTA chest dated 04/06/2015 FINDINGS: No evidence pulmonary embolism. Stable 4.3 cm ascending thoracic aortic aneurysm (series 5/image 66). Mediastinum/Nodes: The heart is normal in size. No pericardial effusion. Coronary atherosclerosis. Atherosclerotic calcifications of the aortic arch. Visualized thyroid is unremarkable. Lungs/Pleura: Multifocal patchy opacities in the bilateral lower lobes, new from May 2016, suspicious for multifocal pneumonia. Dominant opacity measures 1.5 x 3.9 cm in the superior segment left lower lobe (series 6/ image 25). No pleural effusion or pneumothorax. Upper abdomen: Visualized upper abdomen is notable for a moderate hiatal hernia/ intrathoracic stomach and vascular calcifications. Musculoskeletal: Multiple lower thoracolumbar compression fracture deformities with prior vertebral augmentation of two lower thoracic vertebral bodies. Refer to dedicated thoracic spine CT for full report. Review of the MIP images confirms the above findings. IMPRESSION: No evidence of pulmonary embolism. Stable 4.3 cm ascending thoracic aortic aneurysm. Multifocal patchy opacities in the bilateral lower lobes, new from May 2016, suspicious  for multifocal pneumonia. Electronically Signed   By: Julian Hy M.D.   On: 09/04/2015 10:24   Ct Thoracic Spine Wo Contrast  09/04/2015  CLINICAL DATA:  Upper back/left scapular pain EXAM: CT THORACIC SPINE WITHOUT CONTRAST TECHNIQUE: Multidetector CT imaging of the thoracic spine was performed without intravenous contrast administration. Multiplanar CT image reconstructions were also generated. COMPARISON:  Concurrent CTA chest dated 09/04/2015. Prior CTA chest dated 04/06/2015. FINDINGS: Refer to CTA chest report for pertinent vascular and pulmonary findings. Exaggerated thoracic kyphosis. Prior vertebral augmentation at T11 and L1, unchanged. Multilevel thoracolumbar compression fracture deformities, unchanged from 04/06/2015, including: --Mild compression fracture deformities at  T8 and L2 --Moderate compression fracture deformities at L3 (included on CTA chest) --Moderate to severe compression fracture deformities at T9 and T12 Moderate multilevel degenerative changes. No retropulsion. No evidence of acute fracture or dislocation. Left scapula is intact. IMPRESSION: Multilevel thoracolumbar compression fracture deformities, unchanged from May 2016, as described above. Prior vertebral augmentation at T11 and L1. No evidence of acute fracture or dislocation. Left scapula is intact. Refer to CTA chest report for pertinent vascular and pulmonary findings. Electronically Signed   By: Julian Hy M.D.   On: 09/04/2015 10:36   Mr Cervical Spine Wo Contrast  09/05/2015  CLINICAL DATA:  79 year old female with acute onset pain radiating to the left shoulder and scapula. Coughing for 4 days. Initial encounter. Multilevel thoracic compression fractures with some previous augmentation. Thoracic aortic aneurysm. EXAM: MRI CERVICAL SPINE WITHOUT CONTRAST TECHNIQUE: Multiplanar, multisequence MR imaging of the cervical spine was performed. No intravenous contrast was administered. COMPARISON:  Thoracic  spine CT 09/04/2015. FINDINGS: Preserved cervical lordosis. Mild retrolisthesis at C5-C6. Chronic disc and endplate degeneration at that level and C6-C7. No marrow edema or evidence of acute osseous abnormality. Cervicomedullary junction is within normal limits. Patchy T2 hyperintensity in the pons. Grossly negative visualized brain parenchyma otherwise. No spinal cord signal abnormality. Negative cervical paraspinal soft tissues. C2-C3: Left paracentral disc osteophyte complex, no significant stenosis. C3-C4: Left uncovertebral hypertrophy. Mild facet and ligament flavum hypertrophy. No spinal stenosis. Moderate left C4 foraminal stenosis. C4-C5: Moderate facet hypertrophy greater on the right. Mild uncovertebral and ligament flavum hypertrophy. No spinal stenosis. Mild left and moderate right C5 foraminal stenosis. C5-C6: Mild retrolisthesis. Moderate ligament flavum hypertrophy. Disc space loss with circumferential disc osteophyte complex. Mild spinal stenosis. No definite spinal cord mass effect. Uncovertebral hypertrophy. Severe left and mild to moderate right C6 foraminal stenosis. C6-C7: Disc space loss. Circumferential disc osteophyte complex with broad-based posterior component. Mild ligament flavum hypertrophy. Borderline to mild spinal stenosis with no cord mass effect. Uncovertebral hypertrophy. Mild C7 foraminal stenosis greater on the left. C7-T1: Trace anterolisthesis. Mild circumferential disc bulge. Dark T2 and intermediate to high 8 STIR signal filling defect in the left lateral recess and proximal left C8 neural foramen best seen on series 3, images 9 and 10 (also series 7, image 24). Mild facet and ligament flavum hypertrophy. No spinal stenosis. Negative right C8 neural foramen. No upper thoracic spinal stenosis. IMPRESSION: 1. C7-T1 disc extrusion into the left lateral recess and proximal neural foramen suspected. No significant spinal stenosis. Query left C8 radiculitis. 2. Mild multifactorial  degenerative spinal stenosis at C5-C6 and C6-C7. Chronic appearing multifactorial moderate or severe foraminal stenosis at the left C4, right C5, and bilateral C6 nerve levels. Electronically Signed   By: Genevie Ann M.D.   On: 09/05/2015 18:03   Dg Chest Port 1 View  09/04/2015  CLINICAL DATA:  Left shoulder pain and back pain 2 days. EXAM: PORTABLE CHEST 1 VIEW COMPARISON:  01/20/2013 FINDINGS: Patient is rotated to the left. Lungs are adequately inflated without consolidation or effusion. Mild stable cardiomegaly. Calcified plaque is present over the thoracic aorta. Evidence of known moderate size hiatal hernia. Stable curvature of the thoracic spine convex right. Evidence of previous kyphoplasties over the mid to lower thoracic spine. IMPRESSION: No acute cardiopulmonary disease. Mild stable cardiomegaly. Hiatal hernia. Electronically Signed   By: Marin Olp M.D.   On: 09/04/2015 08:16     CBC  Recent Labs Lab 09/04/15 0805 09/04/15 7353 09/05/15 0010 09/07/15 2992 09/08/15 4268  09/09/15 0456  WBC 8.9  --  8.4 8.0 9.8 10.1  HGB 10.8* 13.3 9.3* 11.0* 10.5* 10.8*  HCT 33.5* 39.0 28.9* 34.5* 32.6* 33.4*  PLT 378  --  302 324 301 323  MCV 68.4*  --  68.5* 68.7* 68.6* 67.5*  MCH 22.0*  --  22.0* 21.9* 22.1* 21.8*  MCHC 32.2  --  32.2 31.9 32.2 32.3  RDW 19.0*  --  19.0* 18.9* 18.8* 18.8*  LYMPHSABS 0.6*  --   --   --   --   --   MONOABS 0.5  --   --   --   --   --   EOSABS 0.0  --   --   --   --   --   BASOSABS 0.0  --   --   --   --   --     Chemistries   Recent Labs Lab 09/04/15 0805 09/04/15 0839 09/04/15 1203 09/05/15 0010 09/07/15 0428 09/07/15 1347 09/09/15 0456  NA 132* 134*  --  134* 130* 134* 136  K 3.7 3.7  --  3.8 4.3 4.4 4.5  CL 98* 99*  --  103 97* 101 101  CO2 26  --   --  25 27 25 26   GLUCOSE 132* 129*  --  96 130* 134* 124*  BUN 27* 30*  --  22* 24* 31* 21*  CREATININE 0.59 0.60 0.57 0.68 0.82 0.79 0.84  CALCIUM 8.9  --   --  8.3* 8.7* 9.5 8.9  AST 27   --   --   --  19  --  30  ALT 21  --   --   --  17  --  24  ALKPHOS 54  --   --   --  48  --  44  BILITOT 0.5  --   --   --  0.6  --  0.4   ------------------------------------------------------------------------------------------------------------------ estimated creatinine clearance is 40.3 mL/min (by C-G formula based on Cr of 0.84). ------------------------------------------------------------------------------------------------------------------ No results for input(s): HGBA1C in the last 72 hours. ------------------------------------------------------------------------------------------------------------------ No results for input(s): CHOL, HDL, LDLCALC, TRIG, CHOLHDL, LDLDIRECT in the last 72 hours. ------------------------------------------------------------------------------------------------------------------ No results for input(s): TSH, T4TOTAL, T3FREE, THYROIDAB in the last 72 hours.  Invalid input(s): FREET3 ------------------------------------------------------------------------------------------------------------------  Recent Labs  09/06/15 1345  VITAMINB12 3525*  FOLATE 36.5  FERRITIN 9*  TIBC 361  IRON 19*  RETICCTPCT 1.7    Coagulation profile No results for input(s): INR, PROTIME in the last 168 hours.  No results for input(s): DDIMER in the last 72 hours.  Cardiac Enzymes  Recent Labs Lab 09/04/15 1203 09/04/15 1806 09/05/15 0010  TROPONINI <0.03 <0.03 <0.03   ------------------------------------------------------------------------------------------------------------------ Invalid input(s): POCBNP   CBG: No results for input(s): GLUCAP in the last 168 hours.     Studies: No results found.    Lab Results  Component Value Date   HGBA1C  12/01/2010    5.4 (NOTE)                                                                       According to the ADA Clinical Practice Recommendations for 2011, when HbA1c is used as a screening test:    >=  6.5%   Diagnostic of Diabetes Mellitus           (if abnormal result  is confirmed)  5.7-6.4%   Increased risk of developing Diabetes Mellitus  References:Diagnosis and Classification of Diabetes Mellitus,Diabetes ACZY,6063,01(SWFUX 1):S62-S69 and Standards of Medical Care in         Diabetes - 2011,Diabetes Care,2011,34  (Suppl 1):S11-S61.   Lab Results  Component Value Date   [facility]  12/02/2010    80        Total Cholesterol/HDL:CHD Risk Coronary Heart Disease Risk Table                     Men   Women  1/2 Average Risk   3.4   3.3  Average Risk       5.0   4.4  2 X Average Risk   9.6   7.1  3 X Average Risk  23.4   11.0        Use the calculated Patient Ratio above and the CHD Risk Table to determine the patient's CHD Risk.        ATP III CLASSIFICATION (LDL):  <100     mg/dL   Optimal  100-129  mg/dL   Near or Above                    Optimal  130-159  mg/dL   Borderline  160-189  mg/dL   High  >190     mg/dL   Very High   CREATININE 0.84 09/09/2015       Scheduled Meds: . amLODipine  5 mg Oral Daily  . aspirin EC  81 mg Oral Daily  . Chlorhexidine Gluconate Cloth  6 each Topical Daily  . cholecalciferol  1,000 Units Oral Daily  . conjugated estrogens  1 Applicatorful Vaginal Daily  . darifenacin  7.5 mg Oral Daily  . dexamethasone  4 mg Intravenous Q12H  . hydrocortisone  25 mg Rectal BID  . Influenza vac split quadrivalent PF  0.5 mL Intramuscular Tomorrow-1000  . levofloxacin  750 mg Oral Q48H  . levothyroxine  112 mcg Oral QAC breakfast  . metoprolol succinate  25 mg Oral Daily  . multivitamin  1 tablet Oral Daily  . mupirocin ointment  1 application Nasal BID  . omeprazole  20 mg Oral Daily  . pneumococcal 23 valent vaccine  0.5 mL Intramuscular Tomorrow-1000  . polyethylene glycol  17 g Oral BID  . pravastatin  20 mg Oral QPM  . senna  2 tablet Oral Daily  . vancomycin  1,000 mg Intravenous To SS-Surg   Continuous Infusions:   Principal Problem:    CAP (community acquired pneumonia) Active Problems:   Rib fractures   Fracture lumbar vertebra-closed ([place])   History of DVT of lower extremity   Hypothyroidism   Community acquired pneumonia   Cervical radiculopathy    Time spent: 70 minutes   [facility] Pager [phone]. If 7PM-7AM, please contact night-coverage at [email or web address], password [facility] 09/09/2015, 11:16 AM  LOS: 5 days

## 2015-09-09 NOTE — Care Management Important Message (Signed)
Important Message  Patient Details  Name: [name] MRN: [id number] Date of Birth: 12/24/1935   Medicare Important Message Given:  Yes-third notification given    [name] May 09/09/2015, 1:48 PM

## 2015-09-09 NOTE — Progress Notes (Signed)
ANTIBIOTIC CONSULT NOTE - INITIAL  Pharmacy Consult for vancomycin Indication: surgical prophylaxis  Allergies  Allergen Reactions  . Contrast Media [Iodinated Diagnostic Agents] Shortness Of Breath  . Iohexol Shortness Of Breath  . Penicillins Rash    Has patient had a PCN reaction causing immediate rash, facial/tongue/throat swelling, SOB or lightheadedness with hypotension: No Has patient had a PCN reaction causing severe rash involving mucus membranes or skin necrosis: No Has patient had a PCN reaction that required hospitalization No Has patient had a PCN reaction occurring within the last 10 years: No If all of the above answers are "NO", then may proceed with Cephalosporin use.     Patient Measurements: Height: 5' (152.4 cm) Weight: 105 lb 9.6 oz (47.9 kg) IBW/kg (Calculated) : 45.5   Vital Signs: Temp: 98 F (36.7 C) (10/28 1515) Temp Source: Oral (10/28 0544) BP: 127/70 mmHg (10/28 1506) Pulse Rate: 112 (10/28 1530) Intake/Output from previous day: 10/27 0701 - 10/28 0700 In: 320 [P.O.:320] Out: -  Intake/Output from this shift: Total I/O In: 1100 [I.V.:1100] Out: -   Labs:  Recent Labs  09/07/15 0428 09/07/15 1347 09/08/15 0458 09/09/15 0456  WBC 8.0  --  9.8 10.1  HGB 11.0*  --  10.5* 10.8*  PLT 324  --  301 323  CREATININE 0.82 0.79  --  0.84   Estimated Creatinine Clearance: 40.3 mL/min (by C-G formula based on Cr of 0.84). No results for input(s): VANCOTROUGH, VANCOPEAK, VANCORANDOM, GENTTROUGH, GENTPEAK, GENTRANDOM, TOBRATROUGH, TOBRAPEAK, TOBRARND, AMIKACINPEAK, AMIKACINTROU, AMIKACIN in the last 72 hours.   Microbiology: Recent Results (from the past 720 hour(s))  Blood culture (routine x 2)     Status: None   Collection Time: 09/04/15 10:50 AM  Result Value Ref Range Status   Specimen Description BLOOD LEFT ARM  Final   Special Requests BOTTLES DRAWN AEROBIC ONLY [place]  Final   Culture NO GROWTH 5 DAYS  Final   Report Status 09/09/2015  FINAL  Final  Blood culture (routine x 2)     Status: None   Collection Time: 09/04/15 10:55 AM  Result Value Ref Range Status   Specimen Description BLOOD BLOOD LEFT FOREARM  Final   Special Requests BOTTLES DRAWN AEROBIC ONLY 6CC  Final   Culture NO GROWTH 5 DAYS  Final   Report Status 09/09/2015 FINAL  Final  Urine culture     Status: None   Collection Time: 09/04/15  2:04 PM  Result Value Ref Range Status   Specimen Description URINE, RANDOM  Final   Special Requests NONE  Final   Culture NO GROWTH 1 DAY  Final   Report Status 09/05/2015 FINAL  Final  Surgical pcr screen     Status: Abnormal   Collection Time: 09/09/15  3:49 AM  Result Value Ref Range Status   MRSA, PCR NEGATIVE NEGATIVE Final   Staphylococcus aureus POSITIVE (A) NEGATIVE Final    Comment:        The Xpert SA Assay (FDA approved for NASAL specimens in patients over 77 years of age), is one component of a comprehensive surveillance program.  Test performance has been validated by [facility] for patients greater than or equal to 22 year old. It is not intended to diagnose infection nor to guide or monitor treatment.     Assessment: 79 YOF s/p cervical decompression and fusion. She does not have a wound vac and only needs a one time dose of vancomycin post-op.  Patient has a rash to cephalosporins,  but received vancomycin pre-op. Dose of 1g was given at 1312 this afternoon.  SCr 0.84, CrCl ~77mL/min.  Plan: -vancomycin 750mg  IV x1 to be given at midnight -pharmacy to sign off as no additional doses required. Please re-consult if needed.  Heleena Miceli D. Braylin Formby, PharmD, BCPS Clinical Pharmacist Pager: [phone] 09/09/2015 4:22 PM

## 2015-09-09 NOTE — Progress Notes (Signed)
Awake, alert, conversant.  Strength in left hand intrinsics much improved.  Doing well.

## 2015-09-09 NOTE — Transfer of Care (Signed)
Immediate Anesthesia Transfer of Care Note  Patient: [name]  Procedure(s) Performed: Procedure(s): Anterior Cervical Decompression and Fusion Cervical seven-Thorasic one  (N/A)  Patient Location: PACU  Anesthesia Type:General  Level of Consciousness: awake, alert  and oriented  Airway & Oxygen Therapy: Patient connected to nasal cannula oxygen  Post-op Assessment: Post -op Vital signs reviewed and stable  Post vital signs: stable  Last Vitals:  Filed Vitals:   09/09/15 1434  BP: 135/80  Pulse: 96  Temp:   Resp: 23    Complications: No apparent anesthesia complications

## 2015-09-09 NOTE — Brief Op Note (Signed)
09/04/2015 - 09/09/2015  2:33 PM  PATIENT:  [name]  79 y.o. 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, cervical Radiculopathy, cervical spondylosis, scoliosis, osteoporosis  PROCEDURE:  Procedure(s): Anterior Cervical Decompression and Fusion Cervical seven-Thorasic one  (N/A) with PEEK cage, autograft, allograft, plate  SURGEON:  Surgeon(s) and Role:    * Erline Levine, MD - Primary  PHYSICIAN ASSISTANT:   ASSISTANTS: Poteat, RN   ANESTHESIA:   general  EBL:  Total I/O In: 1000 [I.V.:1000] Out: -   BLOOD ADMINISTERED:none  DRAINS: none   LOCAL MEDICATIONS USED:  LIDOCAINE   SPECIMEN:  No Specimen  DISPOSITION OF SPECIMEN:  N/A  COUNTS:  YES  TOURNIQUET:  * No tourniquets in log *  DICTATION: Patient is 79 year old female with subluxation of C 7 T 1 with HNP, spondylosis, disc degeneration, radiculopathy.  It was elected to take her to surgery for anterior cervical decompression and fusion C 7 T 1 level.  PROCEDURE: Patient was brought to operating room and following the smooth and uncomplicated induction of general endotracheal anesthesia her head was placed on a horseshoe head holder he was placed in 5 pounds of Holter traction and his anterior neck was prepped and draped in usual sterile fashion. C-arm was used intraoperatively. An incision was made on the left side of midline after infiltrating the skin and subcutaneous tissues with local lidocaine. The platysmal layer was incised and subplatysmal dissection was performed exposing the anterior border sternocleidomastoid muscle. Using blunt dissection the carotid sheath was kept lateral and trachea and esophagus kept medial exposing the anterior cervical spine. A bent spinal needle was placed it was felt to be the C 7 T 1 level and this was  confirmed on intraoperative x-ray. Longus coli muscles were taken down from the anterior cervical spine using electrocautery and key elevator and self-retaining retractor was placed exposing the C 7 T 1 level. The interspace was incised and a thorough discectomy was performed. Distraction pins were placed. Uncinate spurs and central spondylitic ridges were drilled down with a high-speed drill. The spinal cord dura and both C8 nerve roots were widely decompressed.  There was a considerable amount of disc material which was removed which was compressing the left C 8 nerve root.   Hemostasis was assured. After trial sizing a 6 mm lordotic allograft bone wedge was selected and packed with local autograft and DBM. This was tamped into position and countersunk appropriately. Distraction weight was removed. A 22 mm Nuvasive Archon anterior cervical plate was affixed to the cervical spine with 13 mm variable-angle screws 2 at C7, 2 at T 1. All screws were well-positioned and locking mechanisms were engaged. A final X ray was obtained which showed well positioned graft and anterior plate without complicating features. Soft tissues were inspected and found to be in good repair. The wound was irrigated. The platysma layer was closed with 3-0 Vicryl stitches and the skin was reapproximated with 3-0 Vicryl subcuticular stitches. The wound was dressed with Dermabond. Counts were correct at the end of the case. Patient was extubated and taken to recovery in stable and satisfactory condition.   PLAN OF CARE: Admit to inpatient   PATIENT DISPOSITION:  PACU - hemodynamically stable.   Delay start of Pharmacological VTE agent (>24hrs) due to surgical blood loss or risk of bleeding: yes

## 2015-09-09 NOTE — Progress Notes (Signed)
Subjective: Patient reports "I'm a little anxious, but ready to get rid of this pain"  Objective: Vital signs in last 24 hours: Temp:  [97.8 F (36.6 C)-98.3 F (36.8 C)] 97.8 F (36.6 C) (10/28 0544) Pulse Rate:  [63-105] 63 (10/28 0544) Resp:  [16-20] 17 (10/28 0544) BP: (123-134)/(71-90) 123/74 mmHg (10/28 0544) SpO2:  [99 %] 99 % (10/28 0544)  Intake/Output from previous day: 10/27 0701 - 10/28 0700 In: 320 [P.O.:320] Out: -  Intake/Output this shift:    Alert, conversant, smiling. Reports no changes (left scapula pain with intermittent LUE pain). Left hand intrinsics remain weak. Pt aware of plan for surgery today & verbalizes understanding of post-op instructions.   Lab Results:  Recent Labs  09/08/15 0458 09/09/15 0456  WBC 9.8 10.1  HGB 10.5* 10.8*  HCT 32.6* 33.4*  PLT 301 323   BMET  Recent Labs  09/07/15 1347 09/09/15 0456  NA 134* 136  K 4.4 4.5  CL 101 101  CO2 25 26  GLUCOSE 134* 124*  BUN 31* 21*  CREATININE 0.79 0.84  CALCIUM 9.5 8.9    Studies/Results: No results found.  Assessment/Plan:   LOS: 5 days  Surgery today: ACDF C7-T1. She has her soft collar to bring to OR.    Verdis Prime 09/09/2015, 8:49 AM

## 2015-09-10 ENCOUNTER — Inpatient Hospital Stay (HOSPITAL_COMMUNITY): Payer: Medicare Other

## 2015-09-10 MED ORDER — PANTOPRAZOLE SODIUM 40 MG PO TBEC
40.0000 mg | DELAYED_RELEASE_TABLET | Freq: Every day | ORAL | Status: DC
  Administered 2015-09-10 – 2015-09-11 (×2): 40 mg via ORAL
  Filled 2015-09-10 (×2): qty 1

## 2015-09-10 MED ORDER — ADULT MULTIVITAMIN W/MINERALS CH
1.0000 | ORAL_TABLET | Freq: Every day | ORAL | Status: DC
  Administered 2015-09-10 – 2015-09-12 (×3): 1 via ORAL
  Filled 2015-09-10 (×3): qty 1

## 2015-09-10 NOTE — Progress Notes (Addendum)
[facility] PROGRESS NOTE  GLORIMAR STROOPE ZTI:[id number] DOB: 09-May-1936 DOA: 09/04/2015 PCP: [name], MD  Length of stay: 6   Assessment/Plan: Principal Problem:   CAP (community acquired pneumonia) Active Problems:   Rib fractures   Fracture lumbar vertebra-closed ([place])   History of DVT of lower extremity   Hypothyroidism   Community acquired pneumonia   Cervical radiculopathy   Herniated cervical intervertebral disc     1. CAP (community acquired pneumonia) Continue levofloxacin, to be dosed by pharmacy, 7 days, pneumonia improving, continue supplemental O2, NEB breathing treatments, monitor CBC. Stable from a pulmonary standpoint to have surgery on Friday, can DC abx if F/U CXR is negative    2. Acute pain in the L scapular area, L upper arm area, history of multi level compression deformities with severe kyphoscoliosis: History of vertebroplasty of the T11- L1 level by Kristeen Miss, MD in  2014   Dr Donald Pore has seen the patient .   Patient is status post Anterior Cervical Decompression and Fusion C7-T1 on 10/29.   DC Decadron. Patient to remain in a hard cervical collar pending further recommendations from neurosurgery.    3. Remote history of LLE DVT not currently on any anticoagulation:DVT prophylaxis with Lovenox.   4.   ascending thoracic aortic aneurysm.-Followed by Ivin Poot, MD. aneurysm without any hematoma ulceration, no contraindication for C-spine surgery per CTS note  5. Microcytic anemia, hemoglobin dropped from 13.3 > 10.8.Marland Kitchen No obvious source of bleeding, replete iron, stool guaiac negative. Recheck CBC tomorrow  6. Constipation, constipation protocol , patient requesting 2 tablets of Senokot, agreeable to start taking miralax today   7. Rectal bleeding from internal hemorrhoids, continue on Anusol HC  DVT prophylaxsis resume Lovenox after surgery  Code Status:      Code Status Orders         Start     Ordered   09/04/15 1154  Full code   Continuous     09/04/15 1159    Advance Directive Documentation        Most Recent Value   Type of Advance Directive  Healthcare Power of Attorney, Living will   Pre-existing out of facility DNR order (yellow form or pink MOST form)     "MOST" Form in Place?       Family Communication: family updated about patient's clinical progress Disposition Plan: Repeat PT evaluation pending.   Brief narrative: 79 y.o. female with a past medical history significant for multilevel degenerative joint disease status post kyphoplasty, remote history of breast cancer, history of gastroesophageal reflux disease, remote history of DVT in the past. The patient's husband was recently hospitalized for pneumonia, he is now currently in a facility for rehabilitation.  Over the course of the past 3-4 days, the patient has had a cough. Initially it was productive and she felt congested. Especially since the last 24 hours she has had a dry cough. She has significant pain in her shoulder/back/scapular area on the left side. Her neighbor brought her in to the emergency department today. She lives by herself currently [place].  The patient has a history of thoracic aneurysm. She is being observed by vascular surgery. The patient underwent workup in the emergency department with a CT scan of the chest showing stable thoracic aneurysm. Patient showed multilevel degenerative disease. CT scan of the chest also showed multilevel bilateral lower lobes infiltrates. Patient denies any fevers denies any chills denies any  shortness of breath. Denies any nausea or vomiting. She denies any chest discomfort. She has had her flu shot earlier this year.  In the emergency department, she has received IV Levaquin, for mail grams of IV morphine, by mouth Flexeril. A hospitalist admission was requested for management of community-acquired pneumonia, acute on chronic  pain secondary to her multilevel degenerative joint disease.  Consultants:  None  Procedures:  None  Antibiotics: Anti-infectives    Start     Dose/Rate Route Frequency Ordered Stop   09/10/15 0000  vancomycin (VANCOCIN) IVPB 750 mg/150 ml premix     750 mg 150 mL/hr over 60 Minutes Intravenous  Once 09/09/15 1623 09/10/15 0110   09/09/15 1304  vancomycin (VANCOCIN) 1 GM/200ML IVPB    Comments:  Theodoro Grist   : cabinet override      09/09/15 1304 09/09/15 1412   09/09/15 1200  vancomycin (VANCOCIN) IVPB 1000 mg/200 mL premix  Status:  Discontinued     1,000 mg 200 mL/hr over 60 Minutes Intravenous To [facility] 09/08/15 1141 09/09/15 1603   09/08/15 1200  levofloxacin (LEVAQUIN) tablet 750 mg     750 mg Oral Every 48 hours 09/07/15 1127     09/06/15 1200  levofloxacin (LEVAQUIN) IVPB 750 mg  Status:  Discontinued     750 mg 100 mL/hr over 90 Minutes Intravenous Every 48 hours 09/05/15 0937 09/07/15 1126   09/04/15 1200  levofloxacin (LEVAQUIN) IVPB 750 mg  Status:  Discontinued     750 mg 100 mL/hr over 90 Minutes Intravenous Every 24 hours 09/04/15 1159 09/05/15 0937   09/04/15 1045  levofloxacin (LEVAQUIN) IVPB 750 mg  Status:  Discontinued     750 mg 100 mL/hr over 90 Minutes Intravenous  Once 09/04/15 1043 09/04/15 1305         HPI/Subjective: Uncomfortable in the hard collar.  Objective: Filed Vitals:   09/09/15 2015 09/09/15 2154 09/10/15 0507 09/10/15 1026  BP:  113/71 125/72 104/65  Pulse: 93 73 47 77  Temp:  98.4 F (36.9 C) 98.3 F (36.8 C) 98.1 F (36.7 C)  TempSrc:  Oral Oral Oral  Resp:  19 19 16   Height:      Weight:      SpO2:  98% 96% 100%    Intake/Output Summary (Last 24 hours) at 09/10/15 1135 Last data filed at 09/10/15 1110  Gross per 24 hour  Intake   1810 ml  Output    600 ml  Net   1210 ml    Exam:  General: No acute respiratory distress Lungs: Clear to auscultation bilaterally without wheezes or  crackles Cardiovascular: Regular rate and rhythm without murmur gallop or rub normal S1 and S2 Abdomen: Nontender, nondistended, soft, bowel sounds positive, no rebound, no ascites, no appreciable mass Extremities: No significant cyanosis, clubbing, or edema bilateral lower extremities     Data Review   Micro Results Recent Results (from the past 240 hour(s))  Blood culture (routine x 2)     Status: None   Collection Time: 09/04/15 10:50 AM  Result Value Ref Range Status   Specimen Description BLOOD LEFT ARM  Final   Special Requests BOTTLES DRAWN AEROBIC ONLY 6CC  Final   Culture NO GROWTH 5 DAYS  Final   Report Status 09/09/2015 FINAL  Final  Blood culture (routine x 2)     Status: None   Collection Time: 09/04/15 10:55 AM  Result Value Ref Range Status   Specimen Description BLOOD BLOOD LEFT  FOREARM  Final   Special Requests BOTTLES DRAWN AEROBIC ONLY [place]  Final   Culture NO GROWTH 5 DAYS  Final   Report Status 09/09/2015 FINAL  Final  Urine culture     Status: None   Collection Time: 09/04/15  2:04 PM  Result Value Ref Range Status   Specimen Description URINE, RANDOM  Final   Special Requests NONE  Final   Culture NO GROWTH 1 DAY  Final   Report Status 09/05/2015 FINAL  Final  Surgical pcr screen     Status: Abnormal   Collection Time: 09/09/15  3:49 AM  Result Value Ref Range Status   MRSA, PCR NEGATIVE NEGATIVE Final   Staphylococcus aureus POSITIVE (A) NEGATIVE Final    Comment:        The Xpert SA Assay (FDA approved for NASAL specimens in patients over 49 years of age), is one component of a comprehensive surveillance program.  Test performance has been validated by [facility] for patients greater than or equal to 44 year old. It is not intended to diagnose infection nor to guide or monitor treatment.     Radiology Reports Dg Chest 2 View  09/07/2015  CLINICAL DATA:  Preoperative evaluation for upcoming neck surgery EXAM: CHEST - 2 VIEW COMPARISON:   09/04/2015 FINDINGS: Cardiac shadow is mildly enlarged. A hiatal hernia is again noted. The lungs are well aerated bilaterally. No focal confluent infiltrate is seen. Changes of prior vertebral augmentation are noted. Multiple compression deformities are again seen. IMPRESSION: No acute abnormality noted. Electronically Signed   By: Inez Catalina M.D.   On: 09/07/2015 08:08   Dg Cervical Spine 1 View  09/09/2015  CLINICAL DATA:  C7-T1 cervical fusion. EXAM: DG C-ARM 61-120 MIN; DG CERVICAL SPINE - 1 VIEW COMPARISON:  Cervical spine MRI 09/05/2015 FINDINGS: Anterior plate and screw fixation at C7-T1 with an interbody device. Endotracheal tube is present. There is a surgical towel or surgical material just anterior to the lower cervical spine. Again noted is disc space narrowing at C5-C6 and C6-C7. IMPRESSION: Anterior cervical fusion at C7-T1. Electronically Signed   By: Markus Daft M.D.   On: 09/09/2015 14:45   Ct Angio Chest Pe W/cm &/or Wo Cm  09/04/2015  CLINICAL DATA:  Left scapular pain x 2 days, upper back pain, history of breast cancer status post right lumpectomy. EXAM: CT ANGIOGRAPHY CHEST WITH CONTRAST TECHNIQUE: Multidetector CT imaging of the chest was performed using the standard protocol during bolus administration of intravenous contrast. Multiplanar CT image reconstructions and MIPs were obtained to evaluate the vascular anatomy. CONTRAST:  17mL OMNIPAQUE IOHEXOL 350 MG/ML SOLN COMPARISON:  CTA chest dated 04/06/2015 FINDINGS: No evidence pulmonary embolism. Stable 4.3 cm ascending thoracic aortic aneurysm (series 5/image 66). Mediastinum/Nodes: The heart is normal in size. No pericardial effusion. Coronary atherosclerosis. Atherosclerotic calcifications of the aortic arch. Visualized thyroid is unremarkable. Lungs/Pleura: Multifocal patchy opacities in the bilateral lower lobes, new from May 2016, suspicious for multifocal pneumonia. Dominant opacity measures 1.5 x 3.9 cm in the superior  segment left lower lobe (series 6/ image 25). No pleural effusion or pneumothorax. Upper abdomen: Visualized upper abdomen is notable for a moderate hiatal hernia/ intrathoracic stomach and vascular calcifications. Musculoskeletal: Multiple lower thoracolumbar compression fracture deformities with prior vertebral augmentation of two lower thoracic vertebral bodies. Refer to dedicated thoracic spine CT for full report. Review of the MIP images confirms the above findings. IMPRESSION: No evidence of pulmonary embolism. Stable 4.3 cm ascending thoracic aortic  aneurysm. Multifocal patchy opacities in the bilateral lower lobes, new from May 2016, suspicious for multifocal pneumonia. Electronically Signed   By: Julian Hy M.D.   On: 09/04/2015 10:24   Ct Thoracic Spine Wo Contrast  09/04/2015  CLINICAL DATA:  Upper back/left scapular pain EXAM: CT THORACIC SPINE WITHOUT CONTRAST TECHNIQUE: Multidetector CT imaging of the thoracic spine was performed without intravenous contrast administration. Multiplanar CT image reconstructions were also generated. COMPARISON:  Concurrent CTA chest dated 09/04/2015. Prior CTA chest dated 04/06/2015. FINDINGS: Refer to CTA chest report for pertinent vascular and pulmonary findings. Exaggerated thoracic kyphosis. Prior vertebral augmentation at T11 and L1, unchanged. Multilevel thoracolumbar compression fracture deformities, unchanged from 04/06/2015, including: --Mild compression fracture deformities at T8 and L2 --Moderate compression fracture deformities at L3 (included on CTA chest) --Moderate to severe compression fracture deformities at T9 and T12 Moderate multilevel degenerative changes. No retropulsion. No evidence of acute fracture or dislocation. Left scapula is intact. IMPRESSION: Multilevel thoracolumbar compression fracture deformities, unchanged from May 2016, as described above. Prior vertebral augmentation at T11 and L1. No evidence of acute fracture or  dislocation. Left scapula is intact. Refer to CTA chest report for pertinent vascular and pulmonary findings. Electronically Signed   By: Julian Hy M.D.   On: 09/04/2015 10:36   Mr Cervical Spine Wo Contrast  09/05/2015  CLINICAL DATA:  79 year old female with acute onset pain radiating to the left shoulder and scapula. Coughing for 4 days. Initial encounter. Multilevel thoracic compression fractures with some previous augmentation. Thoracic aortic aneurysm. EXAM: MRI CERVICAL SPINE WITHOUT CONTRAST TECHNIQUE: Multiplanar, multisequence MR imaging of the cervical spine was performed. No intravenous contrast was administered. COMPARISON:  Thoracic spine CT 09/04/2015. FINDINGS: Preserved cervical lordosis. Mild retrolisthesis at C5-C6. Chronic disc and endplate degeneration at that level and C6-C7. No marrow edema or evidence of acute osseous abnormality. Cervicomedullary junction is within normal limits. Patchy T2 hyperintensity in the pons. Grossly negative visualized brain parenchyma otherwise. No spinal cord signal abnormality. Negative cervical paraspinal soft tissues. C2-C3: Left paracentral disc osteophyte complex, no significant stenosis. C3-C4: Left uncovertebral hypertrophy. Mild facet and ligament flavum hypertrophy. No spinal stenosis. Moderate left C4 foraminal stenosis. C4-C5: Moderate facet hypertrophy greater on the right. Mild uncovertebral and ligament flavum hypertrophy. No spinal stenosis. Mild left and moderate right C5 foraminal stenosis. C5-C6: Mild retrolisthesis. Moderate ligament flavum hypertrophy. Disc space loss with circumferential disc osteophyte complex. Mild spinal stenosis. No definite spinal cord mass effect. Uncovertebral hypertrophy. Severe left and mild to moderate right C6 foraminal stenosis. C6-C7: Disc space loss. Circumferential disc osteophyte complex with broad-based posterior component. Mild ligament flavum hypertrophy. Borderline to mild spinal stenosis with  no cord mass effect. Uncovertebral hypertrophy. Mild C7 foraminal stenosis greater on the left. C7-T1: Trace anterolisthesis. Mild circumferential disc bulge. Dark T2 and intermediate to high 8 STIR signal filling defect in the left lateral recess and proximal left C8 neural foramen best seen on series 3, images 9 and 10 (also series 7, image 24). Mild facet and ligament flavum hypertrophy. No spinal stenosis. Negative right C8 neural foramen. No upper thoracic spinal stenosis. IMPRESSION: 1. C7-T1 disc extrusion into the left lateral recess and proximal neural foramen suspected. No significant spinal stenosis. Query left C8 radiculitis. 2. Mild multifactorial degenerative spinal stenosis at C5-C6 and C6-C7. Chronic appearing multifactorial moderate or severe foraminal stenosis at the left C4, right C5, and bilateral C6 nerve levels. Electronically Signed   By: Genevie Ann M.D.   On: 09/05/2015 18:03  Dg Chest Port 1 View  09/04/2015  CLINICAL DATA:  Left shoulder pain and back pain 2 days. EXAM: PORTABLE CHEST 1 VIEW COMPARISON:  01/20/2013 FINDINGS: Patient is rotated to the left. Lungs are adequately inflated without consolidation or effusion. Mild stable cardiomegaly. Calcified plaque is present over the thoracic aorta. Evidence of known moderate size hiatal hernia. Stable curvature of the thoracic spine convex right. Evidence of previous kyphoplasties over the mid to lower thoracic spine. IMPRESSION: No acute cardiopulmonary disease. Mild stable cardiomegaly. Hiatal hernia. Electronically Signed   By: Marin Olp M.D.   On: 09/04/2015 08:16   Dg C-arm 1-60 Min  09/09/2015  CLINICAL DATA:  C7-T1 cervical fusion. EXAM: DG C-ARM 61-120 MIN; DG CERVICAL SPINE - 1 VIEW COMPARISON:  Cervical spine MRI 09/05/2015 FINDINGS: Anterior plate and screw fixation at C7-T1 with an interbody device. Endotracheal tube is present. There is a surgical towel or surgical material just anterior to the lower cervical spine.  Again noted is disc space narrowing at C5-C6 and C6-C7. IMPRESSION: Anterior cervical fusion at C7-T1. Electronically Signed   By: Markus Daft M.D.   On: 09/09/2015 14:45     CBC  Recent Labs Lab 09/04/15 0805 09/04/15 0839 09/05/15 0010 09/07/15 0428 09/08/15 0458 09/09/15 0456  WBC 8.9  --  8.4 8.0 9.8 10.1  HGB 10.8* 13.3 9.3* 11.0* 10.5* 10.8*  HCT 33.5* 39.0 28.9* 34.5* 32.6* 33.4*  PLT 378  --  302 324 301 323  MCV 68.4*  --  68.5* 68.7* 68.6* 67.5*  MCH 22.0*  --  22.0* 21.9* 22.1* 21.8*  MCHC 32.2  --  32.2 31.9 32.2 32.3  RDW 19.0*  --  19.0* 18.9* 18.8* 18.8*  LYMPHSABS 0.6*  --   --   --   --   --   MONOABS 0.5  --   --   --   --   --   EOSABS 0.0  --   --   --   --   --   BASOSABS 0.0  --   --   --   --   --     Chemistries   Recent Labs Lab 09/04/15 0805 09/04/15 0839 09/04/15 1203 09/05/15 0010 09/07/15 0428 09/07/15 1347 09/09/15 0456  NA 132* 134*  --  134* 130* 134* 136  K 3.7 3.7  --  3.8 4.3 4.4 4.5  CL 98* 99*  --  103 97* 101 101  CO2 26  --   --  25 27 25 26   GLUCOSE 132* 129*  --  96 130* 134* 124*  BUN 27* 30*  --  22* 24* 31* 21*  CREATININE 0.59 0.60 0.57 0.68 0.82 0.79 0.84  CALCIUM 8.9  --   --  8.3* 8.7* 9.5 8.9  AST 27  --   --   --  19  --  30  ALT 21  --   --   --  17  --  24  ALKPHOS 54  --   --   --  48  --  44  BILITOT 0.5  --   --   --  0.6  --  0.4   ------------------------------------------------------------------------------------------------------------------ estimated creatinine clearance is 40.3 mL/min (by C-G formula based on Cr of 0.84). ------------------------------------------------------------------------------------------------------------------ No results for input(s): HGBA1C in the last 72 hours. ------------------------------------------------------------------------------------------------------------------ No results for input(s): CHOL, HDL, LDLCALC, TRIG, CHOLHDL, LDLDIRECT in the last 72  hours. ------------------------------------------------------------------------------------------------------------------ No results for input(s): TSH, T4TOTAL, T3FREE,  THYROIDAB in the last 72 hours.  Invalid input(s): FREET3 ------------------------------------------------------------------------------------------------------------------ No results for input(s): VITAMINB12, FOLATE, FERRITIN, TIBC, IRON, RETICCTPCT in the last 72 hours.  Coagulation profile No results for input(s): INR, PROTIME in the last 168 hours.  No results for input(s): DDIMER in the last 72 hours.  Cardiac Enzymes  Recent Labs Lab 09/04/15 1203 09/04/15 1806 09/05/15 0010  TROPONINI <0.03 <0.03 <0.03   ------------------------------------------------------------------------------------------------------------------ Invalid input(s): POCBNP   CBG: No results for input(s): GLUCAP in the last 168 hours.     Studies: Dg Cervical Spine 1 View  09/09/2015  CLINICAL DATA:  C7-T1 cervical fusion. EXAM: DG C-ARM 61-120 MIN; DG CERVICAL SPINE - 1 VIEW COMPARISON:  Cervical spine MRI 09/05/2015 FINDINGS: Anterior plate and screw fixation at C7-T1 with an interbody device. Endotracheal tube is present. There is a surgical towel or surgical material just anterior to the lower cervical spine. Again noted is disc space narrowing at C5-C6 and C6-C7. IMPRESSION: Anterior cervical fusion at C7-T1. Electronically Signed   By: Markus Daft M.D.   On: 09/09/2015 14:45   Dg C-arm 1-60 Min  09/09/2015  CLINICAL DATA:  C7-T1 cervical fusion. EXAM: DG C-ARM 61-120 MIN; DG CERVICAL SPINE - 1 VIEW COMPARISON:  Cervical spine MRI 09/05/2015 FINDINGS: Anterior plate and screw fixation at C7-T1 with an interbody device. Endotracheal tube is present. There is a surgical towel or surgical material just anterior to the lower cervical spine. Again noted is disc space narrowing at C5-C6 and C6-C7. IMPRESSION: Anterior cervical fusion at  C7-T1. Electronically Signed   By: Markus Daft M.D.   On: 09/09/2015 14:45      Lab Results  Component Value Date   HGBA1C  12/01/2010    5.4 (NOTE)                                                                       According to the ADA Clinical Practice Recommendations for 2011, when HbA1c is used as a screening test:   >=6.5%   Diagnostic of Diabetes Mellitus           (if abnormal result  is confirmed)  5.7-6.4%   Increased risk of developing Diabetes Mellitus  References:Diagnosis and Classification of Diabetes Mellitus,Diabetes DJME,2683,41(DQQIW 1):S62-S69 and Standards of Medical Care in         Diabetes - 2011,Diabetes LNLG,9211,94  (Suppl 1):S11-S61.   Lab Results  Component Value Date   [facility]  12/02/2010    80        Total Cholesterol/HDL:CHD Risk Coronary Heart Disease Risk Table                     Men   Women  1/2 Average Risk   3.4   3.3  Average Risk       5.0   4.4  2 X Average Risk   9.6   7.1  3 X Average Risk  23.4   11.0        Use the calculated Patient Ratio above and the CHD Risk Table to determine the patient's CHD Risk.        ATP III CLASSIFICATION (LDL):  <100     mg/dL   Optimal  100-129  mg/dL   Near or Above                    Optimal  130-159  mg/dL   Borderline  160-189  mg/dL   High  >190     mg/dL   Very High   CREATININE 0.84 09/09/2015       Scheduled Meds: . amLODipine  5 mg Oral Daily  . aspirin EC  81 mg Oral Daily  . Chlorhexidine Gluconate Cloth  6 each Topical Daily  . cholecalciferol  1,000 Units Oral Daily  . conjugated estrogens  1 Applicatorful Vaginal Daily  . darifenacin  7.5 mg Oral Daily  . docusate sodium  100 mg Oral BID  . hydrocortisone  25 mg Rectal BID  . Influenza vac split quadrivalent PF  0.5 mL Intramuscular Tomorrow-1000  . levofloxacin  750 mg Oral Q48H  . levothyroxine  112 mcg Oral QAC breakfast  . metoprolol succinate  25 mg Oral Daily  . multivitamin with minerals  1 tablet Oral Daily  .  mupirocin ointment  1 application Nasal BID  . pantoprazole  40 mg Oral Daily  . pneumococcal 23 valent vaccine  0.5 mL Intramuscular Tomorrow-1000  . polyethylene glycol  17 g Oral BID  . pravastatin  20 mg Oral QPM  . senna  2 tablet Oral Daily  . sodium chloride  3 mL Intravenous Q12H   Continuous Infusions: . sodium chloride    . dextrose 5 % and 0.45 % NaCl with KCl 20 mEq/L 75 mL/hr (09/09/15 1727)    Principal Problem:   CAP (community acquired pneumonia) Active Problems:   Rib fractures   Fracture lumbar vertebra-closed ([place])   History of DVT of lower extremity   Hypothyroidism   Community acquired pneumonia   Cervical radiculopathy   Herniated cervical intervertebral disc    Time spent: 60 minutes   [facility] Pager [phone]. If 7PM-7AM, please contact night-coverage at [email or web address], password [facility] 09/10/2015, 11:35 AM  LOS: 6 days

## 2015-09-10 NOTE — Progress Notes (Signed)
Asked by RN to assist with placing ASPEN collar on patient.  Placed on patient, explained and answered patients questions as best I could.  Gave instructions on cleaning pads and patient aware of extra set of pads for collar and placed with her belongings. Soft collar placed in box.  RN to call if assistance

## 2015-09-10 NOTE — Progress Notes (Signed)
Subjective: Patient reports still some soreness, but overall much improved  Objective: Vital signs in last 24 hours: Temp:  [97 F (36.1 C)-98.4 F (36.9 C)] 98.3 F (36.8 C) (10/29 0507) Pulse Rate:  [46-114] 47 (10/29 0507) Resp:  [19-47] 19 (10/29 0507) BP: (113-135)/(65-80) 125/72 mmHg (10/29 0507) SpO2:  [96 %-100 %] 96 % (10/29 0507)  Intake/Output from previous day: 10/28 0701 - 10/29 0700 In: 1450 [P.O.:200; I.V.:1100; IV Piggyback:150] Out: 300 [Urine:300] Intake/Output this shift: Total I/O In: -  Out: 300 [Urine:300]  Physical Exam: Strength only minimally weak in left hand intrinsics.  Dressing CDI.  Lab Results:  Recent Labs  09/08/15 0458 09/09/15 0456  WBC 9.8 10.1  HGB 10.5* 10.8*  HCT 32.6* 33.4*  PLT 301 323   BMET  Recent Labs  09/07/15 1347 09/09/15 0456  NA 134* 136  K 4.4 4.5  CL 101 101  CO2 25 26  GLUCOSE 134* 124*  BUN 31* 21*  CREATININE 0.79 0.84  CALCIUM 9.5 8.9    Studies/Results: Dg Cervical Spine 1 View  09/09/2015  CLINICAL DATA:  C7-T1 cervical fusion. EXAM: DG C-ARM 61-120 MIN; DG CERVICAL SPINE - 1 VIEW COMPARISON:  Cervical spine MRI 09/05/2015 FINDINGS: Anterior plate and screw fixation at C7-T1 with an interbody device. Endotracheal tube is present. There is a surgical towel or surgical material just anterior to the lower cervical spine. Again noted is disc space narrowing at C5-C6 and C6-C7. IMPRESSION: Anterior cervical fusion at C7-T1. Electronically Signed   By: Markus Daft M.D.   On: 09/09/2015 14:45   Dg C-arm 1-60 Min  09/09/2015  CLINICAL DATA:  C7-T1 cervical fusion. EXAM: DG C-ARM 61-120 MIN; DG CERVICAL SPINE - 1 VIEW COMPARISON:  Cervical spine MRI 09/05/2015 FINDINGS: Anterior plate and screw fixation at C7-T1 with an interbody device. Endotracheal tube is present. There is a surgical towel or surgical material just anterior to the lower cervical spine. Again noted is disc space narrowing at C5-C6 and C6-C7.  IMPRESSION: Anterior cervical fusion at C7-T1. Electronically Signed   By: Markus Daft M.D.   On: 09/09/2015 14:45    Assessment/Plan: Doing well. Waiting for collar.  Mobilize as tolerated.    LOS: 6 days    Peggyann Shoals, MD 09/10/2015, 9:29 AM

## 2015-09-10 NOTE — Evaluation (Signed)
Physical Therapy Evaluation Patient Details Name: [name] MRN: [id number] DOB: 08/01/1936 Today's Date: 09/10/2015   History of Present Illness  Patient is a 79 y/o female presents with left upper back pain and cough. PMH includes multilevel DJD s/p kyphoplasty, breast cancer, DVT, thoracic aneurysm.. S/p ACDF C7-T1 09/09/15.  Clinical Impression  RE-evaluation of patient this am s/p ACDF.  Patient with good mobility this morning and she reports that her pain is better than before surgery.  Patient with limited support at home and she declines short term SNF stay.  She is also hesitant to receive [facility] therapy services secondary to prior experience of being overly sore afterwards.  Patient a little teary-eyed this morning as she is worried about not being able to visit her husband and current post-op restrictions.  Therapy will continue to follow to assist with discharge planning and follow up recommendations. Advised pt to perform seated hip flexion, kicks and ankle pumps while in room with return demo understanding.    Follow Up Recommendations Home health PT;Supervision - Intermittent    Equipment Recommendations  None recommended by PT    Recommendations for Other Services OT consult     Precautions / Restrictions Precautions Precautions: Fall;Back Precaution Booklet Issued: No Precaution Comments: instructed in brace wear schedule, curent precautions Required Braces or Orthoses: Cervical Brace Cervical Brace: Hard collar;At all times Restrictions Weight Bearing Restrictions: No      Mobility  Bed Mobility Overal bed mobility: Needs Assistance Bed Mobility: Supine to Sit (with rail and slightly raised HOB)              Transfers Overall transfer level: Needs assistance Equipment used: Rolling walker (2 wheeled) Transfers: Sit to/from Stand Sit to Stand: Min assist         General transfer comment: min cues for hand placement for  safety  Ambulation/Gait Ambulation/Gait assistance: Min assist Ambulation Distance (Feet): 75 Feet Assistive device: Rolling walker (2 wheeled) Gait Pattern/deviations: Decreased step length - right;Decreased step length - left;Trunk flexed     General Gait Details: min assist to manage RW around furniture and obstacles in room  Stairs            Wheelchair Mobility    Modified Rankin (Stroke Patients Only)       Balance Overall balance assessment: Needs assistance Sitting-balance support: No upper extremity supported;Feet supported Sitting balance-Leahy Scale: Good     Standing balance support: Bilateral upper extremity supported Standing balance-Leahy Scale: Fair                               Pertinent Vitals/Pain Pain Assessment: 0-10 Pain Score: 5  Pain Location: shoulder, upper back Pain Descriptors / Indicators: Aching;Sore;Constant Pain Intervention(s): Limited activity within patient's tolerance;Monitored during session    [place] expects to be discharged to:: Private residence Living Arrangements: Alone Available Help at Discharge: Friend(s);Available PRN/intermittently Type of Home: Apartment Home Access: Ramped entrance     Home Layout: One level Home Equipment: Walker - 2 wheels;Cane - single point;Shower seat Additional Comments: has shower seat but did not use prior to admission    Prior Function Level of Independence: Independent with assistive device(s)         Comments: uses walker outside of home, nothing inside, pt was driving daily to [place] to see her husband who has been a pt for a year     Hand Dominance   Dominant Hand:  Right    Extremity/Trunk Assessment   Upper Extremity Assessment: Defer to OT evaluation           Lower Extremity Assessment: Generalized weakness      Cervical / Trunk Assessment: Kyphotic  Communication   Communication: No difficulties  Cognition  Arousal/Alertness: Awake/alert Behavior During Therapy: WFL for tasks assessed/performed Overall Cognitive Status: Within Functional Limits for tasks assessed                      General Comments General comments (skin integrity, edema, etc.): Pt adamant about no SNF.  She has friends that can assist but she is reluctant to accept  their help.    Exercises        Assessment/Plan    PT Assessment Patient needs continued PT services  PT Diagnosis Difficulty walking;Generalized weakness;Acute pain   PT Problem List Pain;Decreased mobility;Decreased knowledge of precautions;Decreased balance;Decreased activity tolerance;Decreased strength  PT Treatment Interventions Balance training;Gait training;Functional mobility training;Therapeutic activities;Patient/family education;Therapeutic exercise;DME instruction;Neuromuscular re-education   PT Goals (Current goals can be found in the Care Plan section) Acute Rehab PT Goals Patient Stated Goal: be able to visit my husband PT Goal Formulation: With patient Time For Goal Achievement: 09/22/15 Potential to Achieve Goals: Good    Frequency Min 3X/week   Barriers to discharge Decreased caregiver support lives alone    Co-evaluation               End of Session Equipment Utilized During Treatment: Gait belt Activity Tolerance: Patient tolerated treatment well Patient left: in chair;with call bell/phone within reach Nurse Communication: Mobility status         Time: 1130-1208 PT Time Calculation (min) (ACUTE ONLY): 38 min   Charges:   PT Evaluation $PT Re-evaluation: 1 Procedure PT Treatments $Gait Training: 8-22 mins $Self Care/Home Management: 07-31-2023   PT G CodesMalka So, [place] 09/10/2015, 1:12 PM

## 2015-09-10 NOTE — Clinical Social Work Note (Signed)
CSW received referral for SNF.  Case discussed with case manager and plan is to discharge home per PT recommendations.  CSW to sign off please re-consult if social work needs arise.  Jones Broom. [place], MSW, [place]

## 2015-09-11 LAB — CBC
HCT: 30.7 % — ABNORMAL LOW (ref 36.0–46.0)
Hemoglobin: 10.2 g/dL — ABNORMAL LOW (ref 12.0–15.0)
MCH: 23 pg — AB (ref 26.0–34.0)
MCHC: 33.2 g/dL (ref 30.0–36.0)
MCV: 69.1 fL — AB (ref 78.0–100.0)
PLATELETS: 231 10*3/uL (ref 150–400)
RBC: 4.44 MIL/uL (ref 3.87–5.11)
RDW: 19.6 % — ABNORMAL HIGH (ref 11.5–15.5)
WBC: 10.6 10*3/uL — ABNORMAL HIGH (ref 4.0–10.5)

## 2015-09-11 LAB — COMPREHENSIVE METABOLIC PANEL
ALK PHOS: 43 U/L (ref 38–126)
ALT: 21 U/L (ref 14–54)
ANION GAP: 5 (ref 5–15)
AST: 21 U/L (ref 15–41)
Albumin: 2.3 g/dL — ABNORMAL LOW (ref 3.5–5.0)
BUN: 19 mg/dL (ref 6–20)
CALCIUM: 8.1 mg/dL — AB (ref 8.9–10.3)
CHLORIDE: 97 mmol/L — AB (ref 101–111)
CO2: 31 mmol/L (ref 22–32)
CREATININE: 0.78 mg/dL (ref 0.44–1.00)
Glucose, Bld: 87 mg/dL (ref 65–99)
Potassium: 3.8 mmol/L (ref 3.5–5.1)
Sodium: 133 mmol/L — ABNORMAL LOW (ref 135–145)
Total Bilirubin: 0.9 mg/dL (ref 0.3–1.2)
Total Protein: 4.5 g/dL — ABNORMAL LOW (ref 6.5–8.1)

## 2015-09-11 MED ORDER — DEXAMETHASONE 4 MG PO TABS
4.0000 mg | ORAL_TABLET | Freq: Three times a day (TID) | ORAL | Status: AC
  Administered 2015-09-11 – 2015-09-12 (×3): 4 mg via ORAL
  Filled 2015-09-11 (×3): qty 1

## 2015-09-11 NOTE — Progress Notes (Signed)
[facility] PROGRESS NOTE  [name] YIF:[id number] DOB: 06-Mar-1936 DOA: 09/04/2015 PCP: [name], MD  Length of stay: 7   Assessment/Plan: Principal Problem:   CAP (community acquired pneumonia) Active Problems:   Rib fractures   Fracture lumbar vertebra-closed ([place])   History of DVT of lower extremity   Hypothyroidism   Community acquired pneumonia   Cervical radiculopathy   Herniated cervical intervertebral disc     1. CAP (community acquired pneumonia) stopped levofloxacin, after  7 days, pneumonia improving, continue supplemental O2, NEB breathing treatments, monitor CBC.   F/U CXR is negative    2. Acute pain in the L scapular area, L upper arm area, history of multi level compression deformities with severe kyphoscoliosis: History of vertebroplasty of the T11- L1 level by Kristeen Miss, MD in  2014   Dr Donald Pore consulted  .   Patient is status post Anterior Cervical Decompression and Fusion C7-T1 on 10/29.   DC'd  Decadron. Patient to remain in a hard cervical collar pending further recommendations from neurosurgery. Minimise IV narcotics and work with PO pain medications in lieu of going home tomorrow     3. Remote history of LLE DVT not currently on any anticoagulation:DVT prophylaxis with Lovenox.   4.   ascending thoracic aortic aneurysm.-Followed by Ivin Poot, MD. aneurysm without any hematoma ulceration, no contraindication for C-spine surgery per CTS note  5. Microcytic anemia, hemoglobin dropped from 13.3 > 10.8.Marland Kitchen Hemoglobin stable after surgery  6. Constipation, constipation protocol , patient requesting 2 tablets of Senokot, agreeable to start taking miralax today   7. Rectal bleeding from internal hemorrhoids, continue on Anusol HC  8. Left upper extremity pain-patient has 2 peripheral IVs in the left forearm, have instructed the nurse to remove both PIV , and see if some of her pain improves      Code Status:      Code Status Orders        Start     Ordered   09/04/15 1154  Full code   Continuous     09/04/15 1159    Advance Directive Documentation        Most Recent Value   Type of Advance Directive  Healthcare Power of Attorney, Living will   Pre-existing out of facility DNR order (yellow form or pink MOST form)     "MOST" Form in Place?       Family Communication: family updated about patient's clinical progress Disposition Plan: Home health PT, anticipate discharge tomorrow   Brief narrative: 79 y.o. female with a past medical history significant for multilevel degenerative joint disease status post kyphoplasty, remote history of breast cancer, history of gastroesophageal reflux disease, remote history of DVT in the past. The patient's husband was recently hospitalized for pneumonia, he is now currently in a facility for rehabilitation.  Over the course of the past 3-4 days, the patient has had a cough. Initially it was productive and she felt congested. Especially since the last 24 hours she has had a dry cough. She has significant pain in her shoulder/back/scapular area on the left side. Her neighbor brought her in to the emergency department today. She lives by herself currently [place].  The patient has a history of thoracic aneurysm. She is being observed by vascular surgery. The patient underwent workup in the emergency department with a CT scan of the chest showing stable thoracic aneurysm. Patient showed multilevel degenerative disease. CT  scan of the chest also showed multilevel bilateral lower lobes infiltrates. Patient denies any fevers denies any chills denies any shortness of breath. Denies any nausea or vomiting. She denies any chest discomfort. She has had her flu shot earlier this year.  In the emergency department, she has received IV Levaquin, for mail grams of IV morphine, by mouth Flexeril. A hospitalist admission was  requested for management of community-acquired pneumonia, acute on chronic pain secondary to her multilevel degenerative joint disease.  Consultants:  None  Procedures:  None  Antibiotics: Anti-infectives    Start     Dose/Rate Route Frequency Ordered Stop   09/10/15 0000  vancomycin (VANCOCIN) IVPB 750 mg/150 ml premix     750 mg 150 mL/hr over 60 Minutes Intravenous  Once 09/09/15 1623 09/10/15 0110   09/09/15 1304  vancomycin (VANCOCIN) 1 GM/200ML IVPB    Comments:  Theodoro Grist   : cabinet override      09/09/15 1304 09/09/15 1412   09/09/15 1200  vancomycin (VANCOCIN) IVPB 1000 mg/200 mL premix  Status:  Discontinued     1,000 mg 200 mL/hr over 60 Minutes Intravenous To [facility] 09/08/15 1141 09/09/15 1603   09/08/15 1200  levofloxacin (LEVAQUIN) tablet 750 mg  Status:  Discontinued     750 mg Oral Every 48 hours 09/07/15 1127 09/10/15 1404   09/06/15 1200  levofloxacin (LEVAQUIN) IVPB 750 mg  Status:  Discontinued     750 mg 100 mL/hr over 90 Minutes Intravenous Every 48 hours 09/05/15 0937 09/07/15 1126   09/04/15 1200  levofloxacin (LEVAQUIN) IVPB 750 mg  Status:  Discontinued     750 mg 100 mL/hr over 90 Minutes Intravenous Every 24 hours 09/04/15 1159 09/05/15 0937   09/04/15 1045  levofloxacin (LEVAQUIN) IVPB 750 mg  Status:  Discontinued     750 mg 100 mL/hr over 90 Minutes Intravenous  Once 09/04/15 1043 09/04/15 1305         HPI/Subjective: Continues to have left arm pain, patient has 2 IV sites,   Objective: Filed Vitals:   09/10/15 1420 09/10/15 2008 09/11/15 0014 09/11/15 0428  BP: 112/79 110/60 124/68 120/69  Pulse: 82 82 68 58  Temp: 98.7 F (37.1 C) 98.4 F (36.9 C) 98.2 F (36.8 C) 98.2 F (36.8 C)  TempSrc: Oral Oral Oral Oral  Resp: 16 16 16 16   Height:      Weight:      SpO2: 100% 96% 96% 97%    Intake/Output Summary (Last 24 hours) at 09/11/15 1105 Last data filed at 09/11/15 1045  Gross per 24 hour  Intake    360 ml   Output   2600 ml  Net  -2240 ml    Exam:  General: No acute respiratory distress Lungs: Clear to auscultation bilaterally without wheezes or crackles Cardiovascular: Regular rate and rhythm without murmur gallop or rub normal S1 and S2 Abdomen: Nontender, nondistended, soft, bowel sounds positive, no rebound, no ascites, no appreciable mass Extremities: No significant cyanosis, clubbing, or edema bilateral lower extremities     Data Review   Micro Results Recent Results (from the past 240 hour(s))  Blood culture (routine x 2)     Status: None   Collection Time: 09/04/15 10:50 AM  Result Value Ref Range Status   Specimen Description BLOOD LEFT ARM  Final   Special Requests BOTTLES DRAWN AEROBIC ONLY [place]  Final   Culture NO GROWTH 5 DAYS  Final   Report Status 09/09/2015 FINAL  Final  Blood culture (routine x 2)     Status: None   Collection Time: 09/04/15 10:55 AM  Result Value Ref Range Status   Specimen Description BLOOD BLOOD LEFT FOREARM  Final   Special Requests BOTTLES DRAWN AEROBIC ONLY 6CC  Final   Culture NO GROWTH 5 DAYS  Final   Report Status 09/09/2015 FINAL  Final  Urine culture     Status: None   Collection Time: 09/04/15  2:04 PM  Result Value Ref Range Status   Specimen Description URINE, RANDOM  Final   Special Requests NONE  Final   Culture NO GROWTH 1 DAY  Final   Report Status 09/05/2015 FINAL  Final  Surgical pcr screen     Status: Abnormal   Collection Time: 09/09/15  3:49 AM  Result Value Ref Range Status   MRSA, PCR NEGATIVE NEGATIVE Final   Staphylococcus aureus POSITIVE (A) NEGATIVE Final    Comment:        The Xpert SA Assay (FDA approved for NASAL specimens in patients over 28 years of age), is one component of a comprehensive surveillance program.  Test performance has been validated by [facility] for patients greater than or equal to 53 year old. It is not intended to diagnose infection nor to guide or monitor treatment.      Radiology Reports Dg Chest 2 View  09/07/2015  CLINICAL DATA:  Preoperative evaluation for upcoming neck surgery EXAM: CHEST - 2 VIEW COMPARISON:  09/04/2015 FINDINGS: Cardiac shadow is mildly enlarged. A hiatal hernia is again noted. The lungs are well aerated bilaterally. No focal confluent infiltrate is seen. Changes of prior vertebral augmentation are noted. Multiple compression deformities are again seen. IMPRESSION: No acute abnormality noted. Electronically Signed   By: Inez Catalina M.D.   On: 09/07/2015 08:08   Dg Cervical Spine 1 View  09/09/2015  CLINICAL DATA:  C7-T1 cervical fusion. EXAM: DG C-ARM 61-120 MIN; DG CERVICAL SPINE - 1 VIEW COMPARISON:  Cervical spine MRI 09/05/2015 FINDINGS: Anterior plate and screw fixation at C7-T1 with an interbody device. Endotracheal tube is present. There is a surgical towel or surgical material just anterior to the lower cervical spine. Again noted is disc space narrowing at C5-C6 and C6-C7. IMPRESSION: Anterior cervical fusion at C7-T1. Electronically Signed   By: Markus Daft M.D.   On: 09/09/2015 14:45   Ct Angio Chest Pe W/cm &/or Wo Cm  09/04/2015  CLINICAL DATA:  Left scapular pain x 2 days, upper back pain, history of breast cancer status post right lumpectomy. EXAM: CT ANGIOGRAPHY CHEST WITH CONTRAST TECHNIQUE: Multidetector CT imaging of the chest was performed using the standard protocol during bolus administration of intravenous contrast. Multiplanar CT image reconstructions and MIPs were obtained to evaluate the vascular anatomy. CONTRAST:  20mL OMNIPAQUE IOHEXOL 350 MG/ML SOLN COMPARISON:  CTA chest dated 04/06/2015 FINDINGS: No evidence pulmonary embolism. Stable 4.3 cm ascending thoracic aortic aneurysm (series 5/image 66). Mediastinum/Nodes: The heart is normal in size. No pericardial effusion. Coronary atherosclerosis. Atherosclerotic calcifications of the aortic arch. Visualized thyroid is unremarkable. Lungs/Pleura: Multifocal patchy  opacities in the bilateral lower lobes, new from May 2016, suspicious for multifocal pneumonia. Dominant opacity measures 1.5 x 3.9 cm in the superior segment left lower lobe (series 6/ image 25). No pleural effusion or pneumothorax. Upper abdomen: Visualized upper abdomen is notable for a moderate hiatal hernia/ intrathoracic stomach and vascular calcifications. Musculoskeletal: Multiple lower thoracolumbar compression fracture deformities with prior vertebral augmentation of two lower  thoracic vertebral bodies. Refer to dedicated thoracic spine CT for full report. Review of the MIP images confirms the above findings. IMPRESSION: No evidence of pulmonary embolism. Stable 4.3 cm ascending thoracic aortic aneurysm. Multifocal patchy opacities in the bilateral lower lobes, new from May 2016, suspicious for multifocal pneumonia. Electronically Signed   By: Julian Hy M.D.   On: 09/04/2015 10:24   Ct Thoracic Spine Wo Contrast  09/04/2015  CLINICAL DATA:  Upper back/left scapular pain EXAM: CT THORACIC SPINE WITHOUT CONTRAST TECHNIQUE: Multidetector CT imaging of the thoracic spine was performed without intravenous contrast administration. Multiplanar CT image reconstructions were also generated. COMPARISON:  Concurrent CTA chest dated 09/04/2015. Prior CTA chest dated 04/06/2015. FINDINGS: Refer to CTA chest report for pertinent vascular and pulmonary findings. Exaggerated thoracic kyphosis. Prior vertebral augmentation at T11 and L1, unchanged. Multilevel thoracolumbar compression fracture deformities, unchanged from 04/06/2015, including: --Mild compression fracture deformities at T8 and L2 --Moderate compression fracture deformities at L3 (included on CTA chest) --Moderate to severe compression fracture deformities at T9 and T12 Moderate multilevel degenerative changes. No retropulsion. No evidence of acute fracture or dislocation. Left scapula is intact. IMPRESSION: Multilevel thoracolumbar compression  fracture deformities, unchanged from May 2016, as described above. Prior vertebral augmentation at T11 and L1. No evidence of acute fracture or dislocation. Left scapula is intact. Refer to CTA chest report for pertinent vascular and pulmonary findings. Electronically Signed   By: Julian Hy M.D.   On: 09/04/2015 10:36   Mr Cervical Spine Wo Contrast  09/05/2015  CLINICAL DATA:  79 year old female with acute onset pain radiating to the left shoulder and scapula. Coughing for 4 days. Initial encounter. Multilevel thoracic compression fractures with some previous augmentation. Thoracic aortic aneurysm. EXAM: MRI CERVICAL SPINE WITHOUT CONTRAST TECHNIQUE: Multiplanar, multisequence MR imaging of the cervical spine was performed. No intravenous contrast was administered. COMPARISON:  Thoracic spine CT 09/04/2015. FINDINGS: Preserved cervical lordosis. Mild retrolisthesis at C5-C6. Chronic disc and endplate degeneration at that level and C6-C7. No marrow edema or evidence of acute osseous abnormality. Cervicomedullary junction is within normal limits. Patchy T2 hyperintensity in the pons. Grossly negative visualized brain parenchyma otherwise. No spinal cord signal abnormality. Negative cervical paraspinal soft tissues. C2-C3: Left paracentral disc osteophyte complex, no significant stenosis. C3-C4: Left uncovertebral hypertrophy. Mild facet and ligament flavum hypertrophy. No spinal stenosis. Moderate left C4 foraminal stenosis. C4-C5: Moderate facet hypertrophy greater on the right. Mild uncovertebral and ligament flavum hypertrophy. No spinal stenosis. Mild left and moderate right C5 foraminal stenosis. C5-C6: Mild retrolisthesis. Moderate ligament flavum hypertrophy. Disc space loss with circumferential disc osteophyte complex. Mild spinal stenosis. No definite spinal cord mass effect. Uncovertebral hypertrophy. Severe left and mild to moderate right C6 foraminal stenosis. C6-C7: Disc space loss.  Circumferential disc osteophyte complex with broad-based posterior component. Mild ligament flavum hypertrophy. Borderline to mild spinal stenosis with no cord mass effect. Uncovertebral hypertrophy. Mild C7 foraminal stenosis greater on the left. C7-T1: Trace anterolisthesis. Mild circumferential disc bulge. Dark T2 and intermediate to high 8 STIR signal filling defect in the left lateral recess and proximal left C8 neural foramen best seen on series 3, images 9 and 10 (also series 7, image 24). Mild facet and ligament flavum hypertrophy. No spinal stenosis. Negative right C8 neural foramen. No upper thoracic spinal stenosis. IMPRESSION: 1. C7-T1 disc extrusion into the left lateral recess and proximal neural foramen suspected. No significant spinal stenosis. Query left C8 radiculitis. 2. Mild multifactorial degenerative spinal stenosis at C5-C6 and C6-C7.  Chronic appearing multifactorial moderate or severe foraminal stenosis at the left C4, right C5, and bilateral C6 nerve levels. Electronically Signed   By: Genevie Ann M.D.   On: 09/05/2015 18:03   Dg Chest Port 1 View  09/10/2015  CLINICAL DATA:  Pneumonia EXAM: PORTABLE CHEST 1 VIEW COMPARISON:  09/07/2015 FINDINGS: Lungs are essentially clear. No focal consolidation. No pleural effusion or pneumothorax. Cardiomegaly. Moderate hiatal hernia. Cervical spine fixation hardware. IMPRESSION: No evidence of acute cardiopulmonary disease. Electronically Signed   By: Julian Hy M.D.   On: 09/10/2015 12:49   Dg Chest Port 1 View  09/04/2015  CLINICAL DATA:  Left shoulder pain and back pain 2 days. EXAM: PORTABLE CHEST 1 VIEW COMPARISON:  01/20/2013 FINDINGS: Patient is rotated to the left. Lungs are adequately inflated without consolidation or effusion. Mild stable cardiomegaly. Calcified plaque is present over the thoracic aorta. Evidence of known moderate size hiatal hernia. Stable curvature of the thoracic spine convex right. Evidence of previous  kyphoplasties over the mid to lower thoracic spine. IMPRESSION: No acute cardiopulmonary disease. Mild stable cardiomegaly. Hiatal hernia. Electronically Signed   By: Marin Olp M.D.   On: 09/04/2015 08:16   Dg C-arm 1-60 Min  09/09/2015  CLINICAL DATA:  C7-T1 cervical fusion. EXAM: DG C-ARM 61-120 MIN; DG CERVICAL SPINE - 1 VIEW COMPARISON:  Cervical spine MRI 09/05/2015 FINDINGS: Anterior plate and screw fixation at C7-T1 with an interbody device. Endotracheal tube is present. There is a surgical towel or surgical material just anterior to the lower cervical spine. Again noted is disc space narrowing at C5-C6 and C6-C7. IMPRESSION: Anterior cervical fusion at C7-T1. Electronically Signed   By: Markus Daft M.D.   On: 09/09/2015 14:45     CBC  Recent Labs Lab 09/05/15 0010 09/07/15 0428 09/08/15 0458 09/09/15 0456 09/11/15 0605  WBC 8.4 8.0 9.8 10.1 10.6*  HGB 9.3* 11.0* 10.5* 10.8* 10.2*  HCT 28.9* 34.5* 32.6* 33.4* 30.7*  PLT 302 324 301 323 231  MCV 68.5* 68.7* 68.6* 67.5* 69.1*  MCH 22.0* 21.9* 22.1* 21.8* 23.0*  MCHC 32.2 31.9 32.2 32.3 33.2  RDW 19.0* 18.9* 18.8* 18.8* 19.6*    Chemistries   Recent Labs Lab 09/05/15 0010 09/07/15 0428 09/07/15 1347 09/09/15 0456 09/11/15 0605  NA 134* 130* 134* 136 133*  K 3.8 4.3 4.4 4.5 3.8  CL 103 97* 101 101 97*  CO2 25 27 25 26 31   GLUCOSE 96 130* 134* 124* 87  BUN 22* 24* 31* 21* 19  CREATININE 0.68 0.82 0.79 0.84 0.78  CALCIUM 8.3* 8.7* 9.5 8.9 8.1*  AST  --  19  --  30 21  ALT  --  17  --  24 21  ALKPHOS  --  48  --  44 43  BILITOT  --  0.6  --  0.4 0.9   ------------------------------------------------------------------------------------------------------------------ estimated creatinine clearance is 42.3 mL/min (by C-G formula based on Cr of 0.78). ------------------------------------------------------------------------------------------------------------------ No results for input(s): HGBA1C in the last 72  hours. ------------------------------------------------------------------------------------------------------------------ No results for input(s): CHOL, HDL, LDLCALC, TRIG, CHOLHDL, LDLDIRECT in the last 72 hours. ------------------------------------------------------------------------------------------------------------------ No results for input(s): TSH, T4TOTAL, T3FREE, THYROIDAB in the last 72 hours.  Invalid input(s): FREET3 ------------------------------------------------------------------------------------------------------------------ No results for input(s): VITAMINB12, FOLATE, FERRITIN, TIBC, IRON, RETICCTPCT in the last 72 hours.  Coagulation profile No results for input(s): INR, PROTIME in the last 168 hours.  No results for input(s): DDIMER in the last 72 hours.  Cardiac Enzymes  Recent Labs  Lab 09/04/15 1203 09/04/15 1806 09/05/15 0010  TROPONINI <0.03 <0.03 <0.03   ------------------------------------------------------------------------------------------------------------------ Invalid input(s): POCBNP   CBG: No results for input(s): GLUCAP in the last 168 hours.     Studies: Dg Cervical Spine 1 View  09/09/2015  CLINICAL DATA:  C7-T1 cervical fusion. EXAM: DG C-ARM 61-120 MIN; DG CERVICAL SPINE - 1 VIEW COMPARISON:  Cervical spine MRI 09/05/2015 FINDINGS: Anterior plate and screw fixation at C7-T1 with an interbody device. Endotracheal tube is present. There is a surgical towel or surgical material just anterior to the lower cervical spine. Again noted is disc space narrowing at C5-C6 and C6-C7. IMPRESSION: Anterior cervical fusion at C7-T1. Electronically Signed   By: Markus Daft M.D.   On: 09/09/2015 14:45   Dg Chest Port 1 View  09/10/2015  CLINICAL DATA:  Pneumonia EXAM: PORTABLE CHEST 1 VIEW COMPARISON:  09/07/2015 FINDINGS: Lungs are essentially clear. No focal consolidation. No pleural effusion or pneumothorax. Cardiomegaly. Moderate hiatal hernia.  Cervical spine fixation hardware. IMPRESSION: No evidence of acute cardiopulmonary disease. Electronically Signed   By: Julian Hy M.D.   On: 09/10/2015 12:49   Dg C-arm 1-60 Min  09/09/2015  CLINICAL DATA:  C7-T1 cervical fusion. EXAM: DG C-ARM 61-120 MIN; DG CERVICAL SPINE - 1 VIEW COMPARISON:  Cervical spine MRI 09/05/2015 FINDINGS: Anterior plate and screw fixation at C7-T1 with an interbody device. Endotracheal tube is present. There is a surgical towel or surgical material just anterior to the lower cervical spine. Again noted is disc space narrowing at C5-C6 and C6-C7. IMPRESSION: Anterior cervical fusion at C7-T1. Electronically Signed   By: Markus Daft M.D.   On: 09/09/2015 14:45      Lab Results  Component Value Date   HGBA1C  12/01/2010    5.4 (NOTE)                                                                       According to the ADA Clinical Practice Recommendations for 2011, when HbA1c is used as a screening test:   >=6.5%   Diagnostic of Diabetes Mellitus           (if abnormal result  is confirmed)  5.7-6.4%   Increased risk of developing Diabetes Mellitus  References:Diagnosis and Classification of Diabetes Mellitus,Diabetes HUTM,5465,03(TWSFK 1):S62-S69 and Standards of Medical Care in         Diabetes - 2011,Diabetes CLEX,5170,01  (Suppl 1):S11-S61.   Lab Results  Component Value Date   [facility]  12/02/2010    80        Total Cholesterol/HDL:CHD Risk Coronary Heart Disease Risk Table                     Men   Women  1/2 Average Risk   3.4   3.3  Average Risk       5.0   4.4  2 X Average Risk   9.6   7.1  3 X Average Risk  23.4   11.0        Use the calculated Patient Ratio above and the CHD Risk Table to determine the patient's CHD Risk.        ATP III CLASSIFICATION (LDL):  <100  mg/dL   Optimal  100-129  mg/dL   Near or Above                    Optimal  130-159  mg/dL   Borderline  160-189  mg/dL   High  >190     mg/dL   Very High   CREATININE  0.78 09/11/2015       Scheduled Meds: . amLODipine  5 mg Oral Daily  . aspirin EC  81 mg Oral Daily  . Chlorhexidine Gluconate Cloth  6 each Topical Daily  . cholecalciferol  1,000 Units Oral Daily  . conjugated estrogens  1 Applicatorful Vaginal Daily  . darifenacin  7.5 mg Oral Daily  . docusate sodium  100 mg Oral BID  . hydrocortisone  25 mg Rectal BID  . Influenza vac split quadrivalent PF  0.5 mL Intramuscular Tomorrow-1000  . levothyroxine  112 mcg Oral QAC breakfast  . metoprolol succinate  25 mg Oral Daily  . multivitamin with minerals  1 tablet Oral Daily  . mupirocin ointment  1 application Nasal BID  . pantoprazole  40 mg Oral Daily  . pneumococcal 23 valent vaccine  0.5 mL Intramuscular Tomorrow-1000  . polyethylene glycol  17 g Oral BID  . pravastatin  20 mg Oral QPM  . sodium chloride  3 mL Intravenous Q12H   Continuous Infusions: . sodium chloride      Principal Problem:   CAP (community acquired pneumonia) Active Problems:   Rib fractures   Fracture lumbar vertebra-closed ([place])   History of DVT of lower extremity   Hypothyroidism   Community acquired pneumonia   Cervical radiculopathy   Herniated cervical intervertebral disc    Time spent: 45 minutes   [facility] Pager [phone]. If 7PM-7AM, please contact night-coverage at [email or web address], password [facility] 09/11/2015, 11:05 AM  LOS: 7 days

## 2015-09-11 NOTE — Progress Notes (Signed)
Ok per MD not to have IV access.

## 2015-09-11 NOTE — Progress Notes (Signed)
Physical Therapy Treatment Patient Details Name: [name] MRN: [id number] DOB: 05/15/1936 Today's Date: 09/11/2015    History of Present Illness Patient is a 79 y/o female presents with left upper back pain and cough. PMH includes multilevel DJD s/p kyphoplasty, breast cancer, DVT, thoracic aneurysm.. S/p ACDF C7-T1 09/09/15.    PT Comments    [name] made modest progress, ambulating 100 ft w/ RW w/ min guard assist, requiring only 1 standing rest break 2/2 pain radiating to Lt shoulder.  She remains adamant about returning home at d/c.  SpO2 remained above 90% throughout session on RA.  Pt will benefit from continued skilled PT services to increase functional independence and safety.     Follow Up Recommendations  Home health PT;Supervision - Intermittent     Equipment Recommendations  None recommended by PT    Recommendations for Other Services       Precautions / Restrictions Precautions Precautions: Fall;Back Precaution Booklet Issued: No Precaution Comments: Educated pt on precautions  Required Braces or Orthoses: Cervical Brace Cervical Brace: Hard collar;At all times Restrictions Weight Bearing Restrictions: No    Mobility  Bed Mobility Overal bed mobility: Needs Assistance Bed Mobility: Sit to Supine       Sit to supine: Supervision   General bed mobility comments: Cues for scooting to [facility] by bridging up and pushing through Bil LEs.  Pt reports she has been pulling up on rails to scoot to [facility], educated pt on potential harmful consequences of heavy pulling or pushing, pt verbalized understanding.  Transfers Overall transfer level: Needs assistance Equipment used: Rolling walker (2 wheeled) Transfers: Sit to/from Stand Sit to Stand: Min guard         General transfer comment: Pt w/ safe technique. Slow to stand and sit.  Ambulation/Gait Ambulation/Gait assistance: Min guard Ambulation Distance (Feet): 100 Feet Assistive device: Rolling  walker (2 wheeled) Gait Pattern/deviations: Step-through pattern;Decreased stride length;Trunk flexed   Gait velocity interpretation: Below normal speed for age/gender General Gait Details: Forward flexion 2/2 fixed kyphotic posture.  Standing rest break x1 2/2 pain in Lt shoulder.   Stairs            Wheelchair Mobility    Modified Rankin (Stroke Patients Only)       Balance Overall balance assessment: Needs assistance Sitting-balance support: Feet supported Sitting balance-Leahy Scale: Good     Standing balance support: Bilateral upper extremity supported;During functional activity Standing balance-Leahy Scale: Fair                      Cognition Arousal/Alertness: Awake/alert Behavior During Therapy: WFL for tasks assessed/performed Overall Cognitive Status: Within Functional Limits for tasks assessed                      Exercises General Exercises - Lower Extremity Ankle Circles/Pumps: AROM;Both;10 reps;Supine Quad Sets: Strengthening;Both;10 reps;Supine Gluteal Sets: Strengthening;Both;10 reps;Supine    General Comments General comments (skin integrity, edema, etc.): SpO2 remains above 90% on RA throughout session.      Pertinent Vitals/Pain Pain Assessment: 0-10 Pain Score: 4  Pain Location: Lt shoulder Pain Descriptors / Indicators: Aching Pain Intervention(s): Limited activity within patient's tolerance;Monitored during session;Repositioned;Heat applied    Home Living Family/patient expects to be discharged to:: Private residence Living Arrangements: Alone Available Help at Discharge: Friend(s);Available PRN/intermittently Type of Home: Apartment Home Access: Ramped entrance   Home Layout: One level Home Equipment: Walker - 2 wheels;Cane - single point;Shower seat Additional Comments:  has shower seat but did not use prior to admission    Prior Function Level of Independence: Independent with assistive device(s)       Comments: uses walker outside of home, nothing inside, pt was driving daily to [place] to see her husband who has been a pt for a year   PT Goals (current goals can now be found in the care plan section) Acute Rehab PT Goals Patient Stated Goal: be able to visit my husband PT Goal Formulation: With patient Time For Goal Achievement: 09/22/15 Potential to Achieve Goals: Good Progress towards PT goals: Progressing toward goals    Frequency  Min 3X/week    PT Plan Current plan remains appropriate    Co-evaluation             End of Session Equipment Utilized During Treatment: Gait belt;Cervical collar Activity Tolerance: Patient limited by fatigue;Patient limited by pain Patient left: with call bell/phone within reach;in bed;with bed alarm set;with SCD's reapplied     Time: 5300-5110 PT Time Calculation (min) (ACUTE ONLY): 31 min  Charges:  $Gait Training: 8-22 mins $Therapeutic Exercise: 8-22 mins                    G Codes:      Joslyn Hy PT, DPT [phone] Pager: [phone] 09/11/2015, 5:08 PM

## 2015-09-11 NOTE — Evaluation (Signed)
Occupational Therapy Evaluation Patient Details Name: [name] MRN: [id number] DOB: 04-18-1936 Today's Date: 09/11/2015    History of Present Illness Patient is a 79 y/o female presents with left upper back pain and cough. PMH includes multilevel DJD s/p kyphoplasty, breast cancer, DVT, thoracic aneurysm.. S/p ACDF C7-T1 09/09/15.   Clinical Impression   Pt s/p ACDF. Pt currently with functional limitations due to the deficits listed below (see OT Problem List). Pt will benefit from skilled OT to increase their safety and independence with ADL and functional mobility for ADL to facilitate discharge to venue listed below.      Follow Up Recommendations  Home health OT    Equipment Recommendations  None recommended by OT       Precautions / Restrictions Precautions Precautions: Fall;Back Precaution Booklet Issued: No Precaution Comments: instructed in brace wear schedule, curent precautions Required Braces or Orthoses: Cervical Brace Cervical Brace: Hard collar;At all times Restrictions Weight Bearing Restrictions: No      Mobility Bed Mobility Overal bed mobility: Needs Assistance Bed Mobility: Supine to Sit (with rail and slightly raised HOB)              Transfers Overall transfer level: Needs assistance Equipment used: Rolling walker (2 wheeled)   Sit to Stand: Min assist         General transfer comment: min cues for hand placement for safety         ADL Overall ADL's : Needs assistance/impaired Eating/Feeding: Independent;Sitting       Upper Body Bathing: Sitting;Minimal assitance   Lower Body Bathing: Sit to/from stand;Minimal assistance   Upper Body Dressing : Sitting;Minimal assistance   Lower Body Dressing: Sit to/from stand;Minimal assistance   Toilet Transfer: Supervision/safety;BSC;RW   Toileting- Clothing Manipulation and Hygiene: Supervision/safety;Sit to/from stand         .                   Hand  Dominance Right   Extremity/Trunk Assessment Upper Extremity Assessment Upper Extremity Assessment: Generalized weakness LUE Deficits / Details: RUe painful.  IV/s now out and pt hopeful pain will sibside           Communication Communication Communication: No difficulties   Cognition Arousal/Alertness: Awake/alert Behavior During Therapy: WFL for tasks assessed/performed Overall Cognitive Status: Within Functional Limits for tasks assessed                                Home Living Family/patient expects to be discharged to:: Private residence Living Arrangements: Alone Available Help at Discharge: Friend(s);Available PRN/intermittently Type of Home: Apartment Home Access: Ramped entrance     Home Layout: One level           Bathroom Accessibility: Yes   Home Equipment: Walker - 2 wheels;Cane - single point;Shower seat   Additional Comments: has shower seat but did not use prior to admission      Prior Functioning/Environment Level of Independence: Independent with assistive device(s)        Comments: uses walker outside of home, nothing inside, pt was driving daily to [place] to see her husband who has been a pt for a year    OT Diagnosis: Generalized weakness;Acute pain   OT Problem List: Decreased strength;Decreased activity tolerance;Impaired balance (sitting and/or standing);Decreased knowledge of precautions;Pain   OT Treatment/Interventions: Self-care/ADL training;DME and/or AE instruction;Patient/family education    OT Goals(Current goals can be found in the  care plan section) Acute Rehab OT Goals Patient Stated Goal: be able to visit my husband Time For Goal Achievement: 09/25/15 Potential to Achieve Goals: Good  OT Frequency: Min 2X/week   Barriers to D/C: Decreased caregiver support             End of Session Equipment Utilized During Treatment: Rolling walker Nurse Communication: Mobility status (aware of pain)  Activity  Tolerance: Patient limited by pain Patient left: in bed;with call bell/phone within reach   Time: 1500-1525 OT Time Calculation (min): 25 min Charges:  OT General Charges $OT Visit: 1 Procedure OT Evaluation $OT Re-eval: 1 Procedure OT Treatments $Self Care/Home Management : 8-22 mins G-Codes:    Betsy Pries 10/03/2015, 4:25 PM

## 2015-09-11 NOTE — Progress Notes (Signed)
Subjective: Patient reports generally improving, but one episode of severe pain into left arm earier today  Objective: Vital signs in last 24 hours: Temp:  [98.2 F (36.8 C)-98.7 F (37.1 C)] 98.2 F (36.8 C) (10/30 0428) Pulse Rate:  [58-82] 58 (10/30 0428) Resp:  [16] 16 (10/30 0428) BP: (110-124)/(60-79) 120/69 mmHg (10/30 0428) SpO2:  [96 %-100 %] 97 % (10/30 0428)  Intake/Output from previous day: 10/29 0701 - 10/30 0700 In: 360 [P.O.:360] Out: 2150 [Urine:2150] Intake/Output this shift: Total I/O In: 360 [P.O.:360] Out: 850 [Urine:850]  Physical Exam: Strength full left hand intrinsics.  Some left periscapular pain.  Lab Results:  Recent Labs  09/09/15 0456 09/11/15 0605  WBC 10.1 10.6*  HGB 10.8* 10.2*  HCT 33.4* 30.7*  PLT 323 231   BMET  Recent Labs  09/09/15 0456 09/11/15 0605  NA 136 133*  K 4.5 3.8  CL 101 97*  CO2 26 31  GLUCOSE 124* 87  BUN 21* 19  CREATININE 0.84 0.78  CALCIUM 8.9 8.1*    Studies/Results: Dg Cervical Spine 1 View  09/09/2015  CLINICAL DATA:  C7-T1 cervical fusion. EXAM: DG C-ARM 61-120 MIN; DG CERVICAL SPINE - 1 VIEW COMPARISON:  Cervical spine MRI 09/05/2015 FINDINGS: Anterior plate and screw fixation at C7-T1 with an interbody device. Endotracheal tube is present. There is a surgical towel or surgical material just anterior to the lower cervical spine. Again noted is disc space narrowing at C5-C6 and C6-C7. IMPRESSION: Anterior cervical fusion at C7-T1. Electronically Signed   By: Markus Daft M.D.   On: 09/09/2015 14:45   Dg Chest Port 1 View  09/10/2015  CLINICAL DATA:  Pneumonia EXAM: PORTABLE CHEST 1 VIEW COMPARISON:  09/07/2015 FINDINGS: Lungs are essentially clear. No focal consolidation. No pleural effusion or pneumothorax. Cardiomegaly. Moderate hiatal hernia. Cervical spine fixation hardware. IMPRESSION: No evidence of acute cardiopulmonary disease. Electronically Signed   By: Julian Hy M.D.   On: 09/10/2015  12:49   Dg C-arm 1-60 Min  09/09/2015  CLINICAL DATA:  C7-T1 cervical fusion. EXAM: DG C-ARM 61-120 MIN; DG CERVICAL SPINE - 1 VIEW COMPARISON:  Cervical spine MRI 09/05/2015 FINDINGS: Anterior plate and screw fixation at C7-T1 with an interbody device. Endotracheal tube is present. There is a surgical towel or surgical material just anterior to the lower cervical spine. Again noted is disc space narrowing at C5-C6 and C6-C7. IMPRESSION: Anterior cervical fusion at C7-T1. Electronically Signed   By: Markus Daft M.D.   On: 09/09/2015 14:45    Assessment/Plan: Improving, mobilize.  Short course of decadron, D/C home in AM if doing well.    LOS: 7 days    Peggyann Shoals, MD 09/11/2015, 1:22 PM

## 2015-09-12 ENCOUNTER — Encounter (HOSPITAL_COMMUNITY): Payer: Self-pay | Admitting: Neurosurgery

## 2015-09-12 MED ORDER — DOCUSATE SODIUM 100 MG PO CAPS: 100.0000 mg | ORAL_CAPSULE | Freq: Two times a day (BID) | ORAL | Status: DC

## 2015-09-12 MED ORDER — HYDROCODONE-ACETAMINOPHEN 5-325 MG PO TABS: 1.0000 | ORAL_TABLET | ORAL | Status: DC | PRN

## 2015-09-12 MED ORDER — METHOCARBAMOL 500 MG PO TABS: 500.0000 mg | ORAL_TABLET | Freq: Three times a day (TID) | ORAL | Status: DC | PRN

## 2015-09-12 MED ORDER — PANTOPRAZOLE SODIUM 40 MG PO TBEC: 40.0000 mg | DELAYED_RELEASE_TABLET | Freq: Every day | ORAL | Status: DC

## 2015-09-12 MED ORDER — HYDROCORTISONE ACETATE 25 MG RE SUPP: 25.0000 mg | Freq: Two times a day (BID) | RECTAL | Status: DC

## 2015-09-12 MED ORDER — BISACODYL 10 MG RE SUPP: 10.0000 mg | Freq: Every day | RECTAL | Status: DC | PRN

## 2015-09-12 NOTE — Care Management Note (Addendum)
Case Management Note  Patient Details  Name: TANIELLE EMIGH MRN: [id number] Date of Birth: [birth year]  Subjective/Objective:   Date:09/12/15 Spoke with patient at the bedside. 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 Verified patient lives in town, alone. Has DME cane, rolling walker and a wheelchair . Expressed potential need for no other DME.  Verified patient anticipates to go home with [facility] services with [facility], [place], [place], [place], aide  at time of discharge and will have  part-time supervision by friends at this time to best of their knowledge.  NCM gave patient private duty list as well for additional support.  Patient  denied needing help with their medication. Patient  will be taken by Shirlean Mylar her friend to MD appointments. Verified patient has PCP Laurann Montana.   Plan: CM will continue to follow for discharge planning and [facility] resources.                  Action/Plan:   Expected Discharge Date:                  Expected Discharge Plan:  [place]  In-House Referral:     Discharge planning Services  CM Consult  Post Acute Care Choice:  Home Health Choice offered to:  Patient  DME Arranged:    DME Agency:     HH Arranged:  RN, PT, OT, Nurse's Aide [place] Agency:     Status of Service:  Completed, signed off  Medicare Important Message Given:  Yes-third notification given Date Medicare IM Given:    Medicare IM give by:    Date Additional Medicare IM Given:    Additional Medicare Important Message give by:     If discussed at [place] of Stay Meetings, dates discussed:    Additional Comments:  Zenon Mayo, RN 09/12/2015, 2:10 PM

## 2015-09-12 NOTE — Progress Notes (Signed)
Occupational Therapy Treatment Patient Details Name: VANEZA PICKART MRN: [id number] DOB: 1936-07-05 Today's Date: 09/12/2015    History of present illness Patient is a 79 y.o. female presents with left upper back pain and cough. PMH includes multilevel DJD s/p kyphoplasty, breast cancer, DVT, thoracic aneurysm.. S/p ACDF C7-T1 09/09/15.   OT comments  Pt progressing. Education provided in session. Continue to recommend [place].  Follow Up Recommendations  Home health OT;Supervision - Intermittent    Equipment Recommendations  None recommended by OT    Recommendations for Other Services      Precautions / Restrictions Precautions Precautions: Fall;Cervical Precaution Booklet Issued: No Precaution Comments: educated on no movement of neck Required Braces or Orthoses: Cervical Brace Cervical Brace: Hard collar;At all times Restrictions Weight Bearing Restrictions: No       Mobility Bed Mobility Overal bed mobility: Needs Assistance Bed Mobility: Rolling;Sidelying to Sit;Sit to Sidelying Rolling: Supervision Sidelying to sit: Supervision     Sit to sidelying: Supervision General bed mobility comments: cues for technique  Transfers Overall transfer level: Needs assistance   Transfers: Sit to/from Stand Sit to Stand: Supervision         General transfer comment: RW in front for support with sit to stand from bed; no walker in front from toilet    Balance      No LOB in session. Ambulated with RW most of session and stepped in/out of bathroom without RW.                              ADL Overall ADL's : Needs assistance/impaired     Grooming: Wash/dry hands;Wash/dry face;Oral care;Set up;Supervision/safety;Standing                   Toilet Transfer: Supervision/safety;Ambulation;Regular Toilet;Grab bars (used RW to ambulate up to door but stepped in/out of bathroom without RW)   Toileting- Clothing Manipulation and Hygiene:  Supervision/safety (standing/sitting)       Functional mobility during ADLs: Supervision/safety;Rolling walker General ADL Comments: Discussed incorporating precautions into functional activities such as with oral care and UB dressing. Discussed UB clothing.  Cues for precautions in session. OT adjusted cervical collar in session as it was loose.  Recommended someone be with her for showering.       Vision                     Perception     Praxis      Cognition  Awake/Alert Behavior During Therapy: WFL for tasks assessed/performed Overall Cognitive Status: Within Functional Limits for tasks assessed                       Extremity/Trunk Assessment               Exercises     Shoulder Instructions       General Comments      Pertinent Vitals/ Pain       Pain Location: upper back Pain Descriptors / Indicators: Aching Pain Intervention(s): Monitored during session;Repositioned  Home Living                                          Prior Functioning/Environment              Frequency Min 2X/week     Progress  Toward Goals  OT Goals(current goals can now be found in the care plan section)  Progress towards OT goals: Progressing toward goals  Acute Rehab OT Goals Patient Stated Goal: not stated OT Goal Formulation: With patient Time For Goal Achievement: 09/25/15 Potential to Achieve Goals: Good ADL Goals Pt Will Perform Grooming: with modified independence;standing (3 activities) Pt Will Perform Upper Body Bathing: with modified independence;sitting Pt Will Perform Lower Body Bathing: with modified independence;sit to/from stand Pt Will Perform Upper Body Dressing: with modified independence;sitting Pt Will Perform Lower Body Dressing: with modified independence;sit to/from stand Pt Will Transfer to Toilet: with modified independence;ambulating;regular height toilet Pt Will Perform Toileting - Clothing Manipulation  and hygiene: with modified independence;sit to/from stand Pt Will Perform Tub/Shower Transfer: Shower transfer;with supervision;ambulating;shower seat;rolling walker  Plan Discharge plan needs to be updated    Co-evaluation                 End of Session Equipment Utilized During Treatment: Gait belt;Rolling walker;Cervical collar   Activity Tolerance Patient tolerated treatment well   Patient Left in bed;with call bell/phone within reach;with bed alarm set   Nurse Communication          Time: [phone] (time spent urinating on toilet) OT Time Calculation (min): 23 min  Charges: OT General Charges $OT Visit: 1 Procedure OT Treatments $Self Care/Home Management : 8-22 mins  Benito Mccreedy OTR/L [phone] 09/12/2015, 1:01 PM

## 2015-09-12 NOTE — Progress Notes (Signed)
Patient discharge teaching given, including activity, diet, follow-up appoints, and medications. Patient verbalized understanding of all discharge instructions. IV access was d/c'd. Vitals are stable. Skin is intact except as charted in most recent assessments. Pt to be escorted out by NT, to be driven home by family. 

## 2015-09-12 NOTE — Discharge Summary (Addendum)
Physician Discharge Summary  [name] MRN: [id number] DOB/AGE: 06-26-1936 79 y.o.  PCP: [name], MD   Admit date: 09/04/2015 Discharge date: 09/12/2015  Discharge Diagnoses:     Principal Problem:   CAP (community acquired pneumonia) Active Problems:   Rib fractures   Fracture lumbar vertebra-closed ([place])   History of DVT of lower extremity   Hypothyroidism   Community acquired pneumonia   Cervical radiculopathy   Herniated cervical intervertebral disc Anterior Cervical Decompression and Fusion Cervical seven-Thorasic one (N/A) with PEEK cage, autograft, allograft, plate   Follow-up recommendations Follow-up with PCP in 3-5 days , including all  additional recommended appointments as below Follow-up CBC, CMP in 3-5 days      Medication List    TAKE these medications        amLODipine 5 MG tablet  Commonly known as:  NORVASC  Take 1 tablet (5 mg total) by mouth daily.     aspirin 81 MG tablet  Take 81 mg by mouth daily.     bisacodyl 10 MG suppository  Commonly known as:  DULCOLAX  Place 1 suppository (10 mg total) rectally daily as needed for moderate constipation.     BONIVA 150 MG tablet  Generic drug:  ibandronate  Take 150 mg by mouth every 30 (thirty) days.     calcium citrate-vitamin D 315-200 MG-UNIT tablet  Commonly known as:  CITRACAL+D  Take 1 tablet by mouth 2 (two) times daily.     cholecalciferol 1000 UNITS tablet  Commonly known as:  VITAMIN D  Take 1,000 Units by mouth daily. Vitamin D3     docusate sodium 100 MG capsule  Commonly known as:  COLACE  Take 1 capsule (100 mg total) by mouth 2 (two) times daily.     HYDROcodone-acetaminophen 5-325 MG tablet  Commonly known as:  NORCO/VICODIN  Take 1-2 tablets by mouth every 4 (four) hours as needed (mild pain).     hydrocortisone 25 MG suppository  Commonly known as:  ANUSOL-HC  Place 1 suppository (25 mg total) rectally 2 (two) times daily.     levothyroxine 112  MCG tablet  Commonly known as:  SYNTHROID, LEVOTHROID  Take 112 mcg by mouth daily.     methocarbamol 500 MG tablet  Commonly known as:  ROBAXIN  Take 1 tablet (500 mg total) by mouth every 8 (eight) hours as needed for muscle spasms.     metoprolol succinate 25 MG 24 hr tablet  Commonly known as:  TOPROL-XL  TAKE 1 TABLET EACH DAY.     multivitamin tablet  Take 1 tablet by mouth daily.     pantoprazole 40 MG tablet  Commonly known as:  PROTONIX  Take 1 tablet (40 mg total) by mouth daily.     pravastatin 20 MG tablet  Commonly known as:  PRAVACHOL  Take 20 mg by mouth every evening.     PRILOSEC OTC 20 MG tablet  Generic drug:  omeprazole  Take 20 mg by mouth daily.     solifenacin 5 MG tablet  Commonly known as:  VESICARE  Take 5 mg by mouth daily.         Discharge Condition: stable   Discharge Instructions       Discharge Instructions    Diet - low sodium heart healthy    Complete by:  As directed      Increase activity slowly    Complete by:  As directed  Allergies  Allergen Reactions  . Contrast Media [Iodinated Diagnostic Agents] Shortness Of Breath  . Iohexol Shortness Of Breath  . Penicillins Rash    Has patient had a PCN reaction causing immediate rash, facial/tongue/throat swelling, SOB or lightheadedness with hypotension: No Has patient had a PCN reaction causing severe rash involving mucus membranes or skin necrosis: No Has patient had a PCN reaction that required hospitalization No Has patient had a PCN reaction occurring within the last 10 years: No If all of the above answers are "NO", then may proceed with Cephalosporin use.       Disposition: 03-Skilled Nursing Facility   Consults: * neurosurgery    Significant Diagnostic Studies:  Dg Chest 2 View  09/07/2015  CLINICAL DATA:  Preoperative evaluation for upcoming neck surgery EXAM: CHEST - 2 VIEW COMPARISON:  09/04/2015 FINDINGS: Cardiac shadow is mildly  enlarged. A hiatal hernia is again noted. The lungs are well aerated bilaterally. No focal confluent infiltrate is seen. Changes of prior vertebral augmentation are noted. Multiple compression deformities are again seen. IMPRESSION: No acute abnormality noted. Electronically Signed   By: Inez Catalina M.D.   On: 09/07/2015 08:08   Dg Cervical Spine 1 View  09/09/2015  CLINICAL DATA:  C7-T1 cervical fusion. EXAM: DG C-ARM 61-120 MIN; DG CERVICAL SPINE - 1 VIEW COMPARISON:  Cervical spine MRI 09/05/2015 FINDINGS: Anterior plate and screw fixation at C7-T1 with an interbody device. Endotracheal tube is present. There is a surgical towel or surgical material just anterior to the lower cervical spine. Again noted is disc space narrowing at C5-C6 and C6-C7. IMPRESSION: Anterior cervical fusion at C7-T1. Electronically Signed   By: Markus Daft M.D.   On: 09/09/2015 14:45   Ct Angio Chest Pe W/cm &/or Wo Cm  09/04/2015  CLINICAL DATA:  Left scapular pain x 2 days, upper back pain, history of breast cancer status post right lumpectomy. EXAM: CT ANGIOGRAPHY CHEST WITH CONTRAST TECHNIQUE: Multidetector CT imaging of the chest was performed using the standard protocol during bolus administration of intravenous contrast. Multiplanar CT image reconstructions and MIPs were obtained to evaluate the vascular anatomy. CONTRAST:  15m OMNIPAQUE IOHEXOL 350 MG/ML SOLN COMPARISON:  CTA chest dated 04/06/2015 FINDINGS: No evidence pulmonary embolism. Stable 4.3 cm ascending thoracic aortic aneurysm (series 5/image 66). Mediastinum/Nodes: The heart is normal in size. No pericardial effusion. Coronary atherosclerosis. Atherosclerotic calcifications of the aortic arch. Visualized thyroid is unremarkable. Lungs/Pleura: Multifocal patchy opacities in the bilateral lower lobes, new from May 2016, suspicious for multifocal pneumonia. Dominant opacity measures 1.5 x 3.9 cm in the superior segment left lower lobe (series 6/ image 25). No  pleural effusion or pneumothorax. Upper abdomen: Visualized upper abdomen is notable for a moderate hiatal hernia/ intrathoracic stomach and vascular calcifications. Musculoskeletal: Multiple lower thoracolumbar compression fracture deformities with prior vertebral augmentation of two lower thoracic vertebral bodies. Refer to dedicated thoracic spine CT for full report. Review of the MIP images confirms the above findings. IMPRESSION: No evidence of pulmonary embolism. Stable 4.3 cm ascending thoracic aortic aneurysm. Multifocal patchy opacities in the bilateral lower lobes, new from May 2016, suspicious for multifocal pneumonia. Electronically Signed   By: SJulian HyM.D.   On: 09/04/2015 10:24   Ct Thoracic Spine Wo Contrast  09/04/2015  CLINICAL DATA:  Upper back/left scapular pain EXAM: CT THORACIC SPINE WITHOUT CONTRAST TECHNIQUE: Multidetector CT imaging of the thoracic spine was performed without intravenous contrast administration. Multiplanar CT image reconstructions were also generated. COMPARISON:  Concurrent CTA  chest dated 09/04/2015. Prior CTA chest dated 04/06/2015. FINDINGS: Refer to CTA chest report for pertinent vascular and pulmonary findings. Exaggerated thoracic kyphosis. Prior vertebral augmentation at T11 and L1, unchanged. Multilevel thoracolumbar compression fracture deformities, unchanged from 04/06/2015, including: --Mild compression fracture deformities at T8 and L2 --Moderate compression fracture deformities at L3 (included on CTA chest) --Moderate to severe compression fracture deformities at T9 and T12 Moderate multilevel degenerative changes. No retropulsion. No evidence of acute fracture or dislocation. Left scapula is intact. IMPRESSION: Multilevel thoracolumbar compression fracture deformities, unchanged from May 2016, as described above. Prior vertebral augmentation at T11 and L1. No evidence of acute fracture or dislocation. Left scapula is intact. Refer to CTA chest  report for pertinent vascular and pulmonary findings. Electronically Signed   By: Julian Hy M.D.   On: 09/04/2015 10:36   Mr Cervical Spine Wo Contrast  09/05/2015  CLINICAL DATA:  79 year old female with acute onset pain radiating to the left shoulder and scapula. Coughing for 4 days. Initial encounter. Multilevel thoracic compression fractures with some previous augmentation. Thoracic aortic aneurysm. EXAM: MRI CERVICAL SPINE WITHOUT CONTRAST TECHNIQUE: Multiplanar, multisequence MR imaging of the cervical spine was performed. No intravenous contrast was administered. COMPARISON:  Thoracic spine CT 09/04/2015. FINDINGS: Preserved cervical lordosis. Mild retrolisthesis at C5-C6. Chronic disc and endplate degeneration at that level and C6-C7. No marrow edema or evidence of acute osseous abnormality. Cervicomedullary junction is within normal limits. Patchy T2 hyperintensity in the pons. Grossly negative visualized brain parenchyma otherwise. No spinal cord signal abnormality. Negative cervical paraspinal soft tissues. C2-C3: Left paracentral disc osteophyte complex, no significant stenosis. C3-C4: Left uncovertebral hypertrophy. Mild facet and ligament flavum hypertrophy. No spinal stenosis. Moderate left C4 foraminal stenosis. C4-C5: Moderate facet hypertrophy greater on the right. Mild uncovertebral and ligament flavum hypertrophy. No spinal stenosis. Mild left and moderate right C5 foraminal stenosis. C5-C6: Mild retrolisthesis. Moderate ligament flavum hypertrophy. Disc space loss with circumferential disc osteophyte complex. Mild spinal stenosis. No definite spinal cord mass effect. Uncovertebral hypertrophy. Severe left and mild to moderate right C6 foraminal stenosis. C6-C7: Disc space loss. Circumferential disc osteophyte complex with broad-based posterior component. Mild ligament flavum hypertrophy. Borderline to mild spinal stenosis with no cord mass effect. Uncovertebral hypertrophy. Mild C7  foraminal stenosis greater on the left. C7-T1: Trace anterolisthesis. Mild circumferential disc bulge. Dark T2 and intermediate to high 8 STIR signal filling defect in the left lateral recess and proximal left C8 neural foramen best seen on series 3, images 9 and 10 (also series 7, image 24). Mild facet and ligament flavum hypertrophy. No spinal stenosis. Negative right C8 neural foramen. No upper thoracic spinal stenosis. IMPRESSION: 1. C7-T1 disc extrusion into the left lateral recess and proximal neural foramen suspected. No significant spinal stenosis. Query left C8 radiculitis. 2. Mild multifactorial degenerative spinal stenosis at C5-C6 and C6-C7. Chronic appearing multifactorial moderate or severe foraminal stenosis at the left C4, right C5, and bilateral C6 nerve levels. Electronically Signed   By: Genevie Ann M.D.   On: 09/05/2015 18:03   Dg Chest Port 1 View  09/10/2015  CLINICAL DATA:  Pneumonia EXAM: PORTABLE CHEST 1 VIEW COMPARISON:  09/07/2015 FINDINGS: Lungs are essentially clear. No focal consolidation. No pleural effusion or pneumothorax. Cardiomegaly. Moderate hiatal hernia. Cervical spine fixation hardware. IMPRESSION: No evidence of acute cardiopulmonary disease. Electronically Signed   By: Julian Hy M.D.   On: 09/10/2015 12:49   Dg Chest Port 1 View  09/04/2015  CLINICAL DATA:  Left shoulder  pain and back pain 2 days. EXAM: PORTABLE CHEST 1 VIEW COMPARISON:  01/20/2013 FINDINGS: Patient is rotated to the left. Lungs are adequately inflated without consolidation or effusion. Mild stable cardiomegaly. Calcified plaque is present over the thoracic aorta. Evidence of known moderate size hiatal hernia. Stable curvature of the thoracic spine convex right. Evidence of previous kyphoplasties over the mid to lower thoracic spine. IMPRESSION: No acute cardiopulmonary disease. Mild stable cardiomegaly. Hiatal hernia. Electronically Signed   By: Marin Olp M.D.   On: 09/04/2015 08:16   Dg  C-arm 1-60 Min  09/09/2015  CLINICAL DATA:  C7-T1 cervical fusion. EXAM: DG C-ARM 61-120 MIN; DG CERVICAL SPINE - 1 VIEW COMPARISON:  Cervical spine MRI 09/05/2015 FINDINGS: Anterior plate and screw fixation at C7-T1 with an interbody device. Endotracheal tube is present. There is a surgical towel or surgical material just anterior to the lower cervical spine. Again noted is disc space narrowing at C5-C6 and C6-C7. IMPRESSION: Anterior cervical fusion at C7-T1. Electronically Signed   By: Markus Daft M.D.   On: 09/09/2015 14:45        Filed Weights   09/04/15 1429  Weight: 47.9 kg (105 lb 9.6 oz)     Microbiology: Recent Results (from the past 240 hour(s))  Blood culture (routine x 2)     Status: None   Collection Time: 09/04/15 10:50 AM  Result Value Ref Range Status   Specimen Description BLOOD LEFT ARM  Final   Special Requests BOTTLES DRAWN AEROBIC ONLY Halfway  Final   Culture NO GROWTH 5 DAYS  Final   Report Status 09/09/2015 FINAL  Final  Blood culture (routine x 2)     Status: None   Collection Time: 09/04/15 10:55 AM  Result Value Ref Range Status   Specimen Description BLOOD BLOOD LEFT FOREARM  Final   Special Requests BOTTLES DRAWN AEROBIC ONLY 6CC  Final   Culture NO GROWTH 5 DAYS  Final   Report Status 09/09/2015 FINAL  Final  Urine culture     Status: None   Collection Time: 09/04/15  2:04 PM  Result Value Ref Range Status   Specimen Description URINE, RANDOM  Final   Special Requests NONE  Final   Culture NO GROWTH 1 DAY  Final   Report Status 09/05/2015 FINAL  Final  Surgical pcr screen     Status: Abnormal   Collection Time: 09/09/15  3:49 AM  Result Value Ref Range Status   MRSA, PCR NEGATIVE NEGATIVE Final   Staphylococcus aureus POSITIVE (A) NEGATIVE Final    Comment:        The Xpert SA Assay (FDA approved for NASAL specimens in patients over 67 years of age), is one component of a comprehensive surveillance program.  Test performance has been  validated by [facility] for patients greater than or equal to 77 year old. It is not intended to diagnose infection nor to guide or monitor treatment.        Blood Culture    Component Value Date/Time   SDES URINE, RANDOM 09/04/2015 1404   SPECREQUEST NONE 09/04/2015 1404   CULT NO GROWTH 1 DAY 09/04/2015 1404   REPTSTATUS 09/05/2015 FINAL 09/04/2015 1404      Labs: Results for orders placed or performed during the hospital encounter of 09/04/15 (from the past 48 hour(s))  CBC     Status: Abnormal   Collection Time: 09/11/15  6:05 AM  Result Value Ref Range   WBC 10.6 (H) 4.0 - 10.5  K/uL   RBC 4.44 3.87 - 5.11 MIL/uL   Hemoglobin 10.2 (L) 12.0 - 15.0 g/dL   HCT 30.7 (L) 36.0 - 46.0 %   MCV 69.1 (L) 78.0 - 100.0 fL   MCH 23.0 (L) 26.0 - 34.0 pg   MCHC 33.2 30.0 - 36.0 g/dL   RDW 19.6 (H) 11.5 - 15.5 %   Platelets 231 150 - 400 K/uL    Comment: PLATELET COUNT CONFIRMED BY SMEAR  Comprehensive metabolic panel     Status: Abnormal   Collection Time: 09/11/15  6:05 AM  Result Value Ref Range   Sodium 133 (L) 135 - 145 mmol/L   Potassium 3.8 3.5 - 5.1 mmol/L   Chloride 97 (L) 101 - 111 mmol/L   CO2 31 22 - 32 mmol/L   Glucose, Bld 87 65 - 99 mg/dL   BUN 19 6 - 20 mg/dL   Creatinine, Ser 0.78 0.44 - 1.00 mg/dL   Calcium 8.1 (L) 8.9 - 10.3 mg/dL   Total Protein 4.5 (L) 6.5 - 8.1 g/dL   Albumin 2.3 (L) 3.5 - 5.0 g/dL   AST 21 15 - 41 U/L   ALT 21 14 - 54 U/L   Alkaline Phosphatase 43 38 - 126 U/L   Total Bilirubin 0.9 0.3 - 1.2 mg/dL   GFR calc non Af Amer >60 >60 mL/min   GFR calc Af Amer >60 >60 mL/min    Comment: (NOTE) The eGFR has been calculated using the CKD EPI equation. This calculation has not been validated in all clinical situations. eGFR's persistently <60 mL/min signify possible Chronic Kidney Disease.    Anion gap 5 5 - 15     Lipid Panel     Component Value Date/Time   CHOL  12/02/2010 0400    149        ATP III CLASSIFICATION:  <200      mg/dL   Desirable  200-239  mg/dL   Borderline High  >=240    mg/dL   High          TRIG 54 12/02/2010 0400   HDL 58 12/02/2010 0400   CHOLHDL 2.6 12/02/2010 0400   VLDL 11 12/02/2010 0400   LDLCALC  12/02/2010 0400    80        Total Cholesterol/HDL:CHD Risk Coronary Heart Disease Risk Table                     Men   Women  1/2 Average Risk   3.4   3.3  Average Risk       5.0   4.4  2 X Average Risk   9.6   7.1  3 X Average Risk  23.4   11.0        Use the calculated Patient Ratio above and the CHD Risk Table to determine the patient's CHD Risk.        ATP III CLASSIFICATION (LDL):  <100     mg/dL   Optimal  100-129  mg/dL   Near or Above                    Optimal  130-159  mg/dL   Borderline  160-189  mg/dL   High  >190     mg/dL   Very High     Lab Results  Component Value Date   HGBA1C  12/01/2010    5.4 (NOTE)  According to the ADA Clinical Practice Recommendations for 2011, when HbA1c is used as a screening test:   >=6.5%   Diagnostic of Diabetes Mellitus           (if abnormal result  is confirmed)  5.7-6.4%   Increased risk of developing Diabetes Mellitus  References:Diagnosis and Classification of Diabetes Mellitus,Diabetes XYVO,5929,24(MQKMM 1):S62-S69 and Standards of Medical Care in         Diabetes - 2011,Diabetes NOTR,7116,57  (Suppl 1):S11-S61.     Lab Results  Component Value Date   [facility]  12/02/2010    80        Total Cholesterol/HDL:CHD Risk Coronary Heart Disease Risk Table                     Men   Women  1/2 Average Risk   3.4   3.3  Average Risk       5.0   4.4  2 X Average Risk   9.6   7.1  3 X Average Risk  23.4   11.0        Use the calculated Patient Ratio above and the CHD Risk Table to determine the patient's CHD Risk.        ATP III CLASSIFICATION (LDL):  <100     mg/dL   Optimal  100-129  mg/dL   Near or Above                    Optimal  130-159  mg/dL    Borderline  160-189  mg/dL   High  >190     mg/dL   Very High   CREATININE 0.78 09/11/2015     HPI :  79 y.o. female with a past medical history significant for multilevel degenerative joint disease status post kyphoplasty, remote history of breast cancer, history of gastroesophageal reflux disease, remote history of DVT in the past. The patient's husband was recently hospitalized for pneumonia, he is now currently in a facility for rehabilitation.  Over the course of the past 3-4 days, the patient has had a cough. Initially it was productive and she felt congested. Especially since the last 24 hours she has had a dry cough. She has significant pain in her shoulder/back/scapular area on the left side. Her neighbor brought her in to the emergency department today. She lives by herself [place].  The patient has a history of thoracic aneurysm. She is being observed by vascular surgery. The patient underwent workup in the emergency department with a CT scan of the chest showing stable thoracic aneurysm. Patient showed multilevel degenerative disease. CT scan of the chest also showed multilevel bilateral lower lobes infiltrates. Patient denies any fevers denies any chills denies any shortness of breath. Denies any nausea or vomiting. She denies any chest discomfort. She has had her flu shot earlier this year.  In the emergency department, she has received IV Levaquin, for mail grams of IV morphine, by mouth Flexeril. A hospitalist admission was requested for management of community-acquired pneumonia, acute on chronic pain secondary to her multilevel degenerative joint disease  HOSPITAL COURSE:    1. CAP (community acquired pneumonia) stopped levofloxacin, after 7 days, pneumonia improving, continue supplemental O2, NEB breathing treatments, monitor CBC. F/U CXR is negative    2. Acute pain in the L scapular area, L upper arm area, history of  multi level compression deformities with severe kyphoscoliosis: History of vertebroplasty of the T11- L1 level  by Kristeen Miss, MD in 2014  Dr Donald Pore consulted . Patient is status post Anterior Cervical Decompression and Fusion C7-T1 on 10/29. DC'd Decadron. Patient to remain in a hard cervical collar pending further recommendations from neurosurgery. Minimise IV narcotics and work with PO pain medications in lieu of going home today, PT/OT to work with patient , patient anxious to return home due to physical limitations , even though medically stable . Requested case mx to address concerns     3. Remote history of LLE DVT  On prophylaxis with Lovenox this admission .   4. ascending thoracic aortic aneurysm.-Followed by Ivin Poot, MD. aneurysm without any hematoma ulceration, no contraindication for C-spine surgery per CTS note  5. Microcytic anemia, hemoglobin dropped from 13.3 > 10.2.. Hemoglobin stable after surgery  6. Constipation, constipation protocol , patient requesting 2 tablets of Senokot, agreeable to   taking miralax today   7. Rectal bleeding from internal hemorrhoids, continue on Anusol HC  8. Left upper extremity pain-patient has 2 peripheral IVs in the left forearm, have instructed the nurse to remove both PIV ,sucpect that her pain will improve slowly and patient can DC home with an aggressive pain regimen .     Discharge Exam:   Blood pressure 135/85, pulse 116, temperature 97.5 F (36.4 C), temperature source Oral, resp. rate 16, height 5' (1.524 m), weight 47.9 kg (105 lb 9.6 oz), SpO2 98 %.  General: No acute respiratory distress Lungs: Clear to auscultation bilaterally without wheezes or crackles Cardiovascular: Regular rate and rhythm without murmur gallop or rub normal S1 and S2 Abdomen: Nontender, nondistended, soft, bowel sounds positive, no rebound, no ascites, no appreciable mass Extremities: No significant cyanosis, clubbing, or  edema bilateral lower extremities      Signed: Zena Vitelli 09/12/2015, 11:33 AM        Time spent >45 mins

## 2015-09-12 NOTE — Progress Notes (Signed)
Subjective: Patient reports still sore left arm, but better than preop.  Objective: Vital signs in last 24 hours: Temp:  [97.5 F (36.4 C)-98.3 F (36.8 C)] 97.5 F (36.4 C) (10/31 0435) Pulse Rate:  [48-100] 63 (10/31 0435) Resp:  [16] 16 (10/31 0435) BP: (108-123)/(74-78) 123/78 mmHg (10/31 0435) SpO2:  [96 %-98 %] 98 % (10/31 0435)  Intake/Output from previous day: 10/30 0701 - 10/31 0700 In: 600 [P.O.:600] Out: 2400 [Urine:2400] Intake/Output this shift:    Physical Exam: Strength left hand intrinsics minimally weak.  Dressing CDI.  Lab Results:  Recent Labs  09/11/15 0605  WBC 10.6*  HGB 10.2*  HCT 30.7*  PLT 231   BMET  Recent Labs  09/11/15 0605  NA 133*  K 3.8  CL 97*  CO2 31  GLUCOSE 87  BUN 19  CREATININE 0.78  CALCIUM 8.1*    Studies/Results: Dg Chest Port 1 View  09/10/2015  CLINICAL DATA:  Pneumonia EXAM: PORTABLE CHEST 1 VIEW COMPARISON:  09/07/2015 FINDINGS: Lungs are essentially clear. No focal consolidation. No pleural effusion or pneumothorax. Cardiomegaly. Moderate hiatal hernia. Cervical spine fixation hardware. IMPRESSION: No evidence of acute cardiopulmonary disease. Electronically Signed   By: Julian Hy M.D.   On: 09/10/2015 12:49    Assessment/Plan: Mobilize with PT/OT.  If doing better, may potentially D/C in AM.    LOS: 8 days    Eder Macek D, MD 09/12/2015, 8:10 AM

## 2015-09-13 DIAGNOSIS — E039 Hypothyroidism, unspecified: Secondary | ICD-10-CM | POA: Diagnosis not present

## 2015-09-13 DIAGNOSIS — M8008XD Age-related osteoporosis with current pathological fracture, vertebra(e), subsequent encounter for fracture with routine healing: Secondary | ICD-10-CM | POA: Diagnosis not present

## 2015-09-13 DIAGNOSIS — M8468XD Pathological fracture in other disease, other site, subsequent encounter for fracture with routine healing: Secondary | ICD-10-CM | POA: Diagnosis not present

## 2015-09-13 DIAGNOSIS — I712 Thoracic aortic aneurysm, without rupture: Secondary | ICD-10-CM | POA: Diagnosis not present

## 2015-09-13 DIAGNOSIS — Z7982 Long term (current) use of aspirin: Secondary | ICD-10-CM | POA: Diagnosis not present

## 2015-09-13 DIAGNOSIS — Z981 Arthrodesis status: Secondary | ICD-10-CM | POA: Diagnosis not present

## 2015-09-13 DIAGNOSIS — F419 Anxiety disorder, unspecified: Secondary | ICD-10-CM | POA: Diagnosis not present

## 2015-09-13 DIAGNOSIS — I10 Essential (primary) hypertension: Secondary | ICD-10-CM | POA: Diagnosis not present

## 2015-09-13 DIAGNOSIS — Z86718 Personal history of other venous thrombosis and embolism: Secondary | ICD-10-CM | POA: Diagnosis not present

## 2015-09-13 DIAGNOSIS — Z853 Personal history of malignant neoplasm of breast: Secondary | ICD-10-CM | POA: Diagnosis not present

## 2015-09-13 DIAGNOSIS — Z8701 Personal history of pneumonia (recurrent): Secondary | ICD-10-CM | POA: Diagnosis not present

## 2015-09-13 DIAGNOSIS — M797 Fibromyalgia: Secondary | ICD-10-CM | POA: Diagnosis not present

## 2015-09-13 NOTE — Care Management Important Message (Signed)
Important Message  Patient Details  Name: IYONNAH FERRANTE MRN: [id number] Date of Birth: 1936-10-03   Medicare Important Message Given:  Yes-fourth notification given on 09/12/15    Zenon Mayo, RN 09/13/2015, 10:54 AMImportant Message  Patient Details  Name: LUNNA VOGELGESANG MRN: [id number] Date of Birth: October 02, 1936   Medicare Important Message Given:  Yes-fourth notification given    Zenon Mayo, RN 09/13/2015, 10:53 AM

## 2015-09-14 DIAGNOSIS — F419 Anxiety disorder, unspecified: Secondary | ICD-10-CM | POA: Diagnosis not present

## 2015-09-14 DIAGNOSIS — I712 Thoracic aortic aneurysm, without rupture: Secondary | ICD-10-CM | POA: Diagnosis not present

## 2015-09-14 DIAGNOSIS — M8468XD Pathological fracture in other disease, other site, subsequent encounter for fracture with routine healing: Secondary | ICD-10-CM | POA: Diagnosis not present

## 2015-09-14 DIAGNOSIS — M797 Fibromyalgia: Secondary | ICD-10-CM | POA: Diagnosis not present

## 2015-09-14 DIAGNOSIS — M8008XD Age-related osteoporosis with current pathological fracture, vertebra(e), subsequent encounter for fracture with routine healing: Secondary | ICD-10-CM | POA: Diagnosis not present

## 2015-09-14 DIAGNOSIS — I10 Essential (primary) hypertension: Secondary | ICD-10-CM | POA: Diagnosis not present

## 2015-09-15 DIAGNOSIS — M8468XD Pathological fracture in other disease, other site, subsequent encounter for fracture with routine healing: Secondary | ICD-10-CM | POA: Diagnosis not present

## 2015-09-15 DIAGNOSIS — I712 Thoracic aortic aneurysm, without rupture: Secondary | ICD-10-CM | POA: Diagnosis not present

## 2015-09-15 DIAGNOSIS — M797 Fibromyalgia: Secondary | ICD-10-CM | POA: Diagnosis not present

## 2015-09-15 DIAGNOSIS — F419 Anxiety disorder, unspecified: Secondary | ICD-10-CM | POA: Diagnosis not present

## 2015-09-15 DIAGNOSIS — M8008XD Age-related osteoporosis with current pathological fracture, vertebra(e), subsequent encounter for fracture with routine healing: Secondary | ICD-10-CM | POA: Diagnosis not present

## 2015-09-15 DIAGNOSIS — I10 Essential (primary) hypertension: Secondary | ICD-10-CM | POA: Diagnosis not present

## 2015-09-16 DIAGNOSIS — I712 Thoracic aortic aneurysm, without rupture: Secondary | ICD-10-CM | POA: Diagnosis not present

## 2015-09-16 DIAGNOSIS — M8468XD Pathological fracture in other disease, other site, subsequent encounter for fracture with routine healing: Secondary | ICD-10-CM | POA: Diagnosis not present

## 2015-09-16 DIAGNOSIS — I10 Essential (primary) hypertension: Secondary | ICD-10-CM | POA: Diagnosis not present

## 2015-09-16 DIAGNOSIS — F419 Anxiety disorder, unspecified: Secondary | ICD-10-CM | POA: Diagnosis not present

## 2015-09-16 DIAGNOSIS — M797 Fibromyalgia: Secondary | ICD-10-CM | POA: Diagnosis not present

## 2015-09-16 DIAGNOSIS — M8008XD Age-related osteoporosis with current pathological fracture, vertebra(e), subsequent encounter for fracture with routine healing: Secondary | ICD-10-CM | POA: Diagnosis not present

## 2015-09-19 DIAGNOSIS — F419 Anxiety disorder, unspecified: Secondary | ICD-10-CM | POA: Diagnosis not present

## 2015-09-19 DIAGNOSIS — I10 Essential (primary) hypertension: Secondary | ICD-10-CM | POA: Diagnosis not present

## 2015-09-19 DIAGNOSIS — M8468XD Pathological fracture in other disease, other site, subsequent encounter for fracture with routine healing: Secondary | ICD-10-CM | POA: Diagnosis not present

## 2015-09-19 DIAGNOSIS — M797 Fibromyalgia: Secondary | ICD-10-CM | POA: Diagnosis not present

## 2015-09-19 DIAGNOSIS — M8008XD Age-related osteoporosis with current pathological fracture, vertebra(e), subsequent encounter for fracture with routine healing: Secondary | ICD-10-CM | POA: Diagnosis not present

## 2015-09-19 DIAGNOSIS — I712 Thoracic aortic aneurysm, without rupture: Secondary | ICD-10-CM | POA: Diagnosis not present

## 2015-09-20 DIAGNOSIS — M8468XD Pathological fracture in other disease, other site, subsequent encounter for fracture with routine healing: Secondary | ICD-10-CM | POA: Diagnosis not present

## 2015-09-20 DIAGNOSIS — F419 Anxiety disorder, unspecified: Secondary | ICD-10-CM | POA: Diagnosis not present

## 2015-09-20 DIAGNOSIS — M797 Fibromyalgia: Secondary | ICD-10-CM | POA: Diagnosis not present

## 2015-09-20 DIAGNOSIS — M8008XD Age-related osteoporosis with current pathological fracture, vertebra(e), subsequent encounter for fracture with routine healing: Secondary | ICD-10-CM | POA: Diagnosis not present

## 2015-09-20 DIAGNOSIS — I10 Essential (primary) hypertension: Secondary | ICD-10-CM | POA: Diagnosis not present

## 2015-09-20 DIAGNOSIS — I712 Thoracic aortic aneurysm, without rupture: Secondary | ICD-10-CM | POA: Diagnosis not present

## 2015-09-21 ENCOUNTER — Telehealth: Payer: Self-pay | Admitting: Neurology

## 2015-09-21 DIAGNOSIS — M797 Fibromyalgia: Secondary | ICD-10-CM | POA: Diagnosis not present

## 2015-09-21 DIAGNOSIS — I10 Essential (primary) hypertension: Secondary | ICD-10-CM | POA: Diagnosis not present

## 2015-09-21 DIAGNOSIS — M8008XD Age-related osteoporosis with current pathological fracture, vertebra(e), subsequent encounter for fracture with routine healing: Secondary | ICD-10-CM | POA: Diagnosis not present

## 2015-09-21 DIAGNOSIS — F419 Anxiety disorder, unspecified: Secondary | ICD-10-CM | POA: Diagnosis not present

## 2015-09-21 DIAGNOSIS — I712 Thoracic aortic aneurysm, without rupture: Secondary | ICD-10-CM | POA: Diagnosis not present

## 2015-09-21 DIAGNOSIS — M8468XD Pathological fracture in other disease, other site, subsequent encounter for fracture with routine healing: Secondary | ICD-10-CM | POA: Diagnosis not present

## 2015-09-21 NOTE — Telephone Encounter (Signed)
Called Mrs. Bango back about her husband several times but the phone would ring once and go to a busy signal.  I had another co-worker Research scientist (medical)) call the number and the same thing happened.

## 2015-09-21 NOTE — Telephone Encounter (Signed)
VM-PT asked if you can give her a call back/Dawn CB# [phone]

## 2015-09-22 DIAGNOSIS — I10 Essential (primary) hypertension: Secondary | ICD-10-CM | POA: Diagnosis not present

## 2015-09-22 DIAGNOSIS — M797 Fibromyalgia: Secondary | ICD-10-CM | POA: Diagnosis not present

## 2015-09-22 DIAGNOSIS — F419 Anxiety disorder, unspecified: Secondary | ICD-10-CM | POA: Diagnosis not present

## 2015-09-22 DIAGNOSIS — M8008XD Age-related osteoporosis with current pathological fracture, vertebra(e), subsequent encounter for fracture with routine healing: Secondary | ICD-10-CM | POA: Diagnosis not present

## 2015-09-22 DIAGNOSIS — M8468XD Pathological fracture in other disease, other site, subsequent encounter for fracture with routine healing: Secondary | ICD-10-CM | POA: Diagnosis not present

## 2015-09-22 DIAGNOSIS — I712 Thoracic aortic aneurysm, without rupture: Secondary | ICD-10-CM | POA: Diagnosis not present

## 2015-09-26 ENCOUNTER — Other Ambulatory Visit: Payer: Self-pay | Admitting: Cardiothoracic Surgery

## 2015-09-26 DIAGNOSIS — I10 Essential (primary) hypertension: Secondary | ICD-10-CM | POA: Diagnosis not present

## 2015-09-26 DIAGNOSIS — M5030 Other cervical disc degeneration, unspecified cervical region: Secondary | ICD-10-CM | POA: Diagnosis not present

## 2015-09-26 DIAGNOSIS — M8008XD Age-related osteoporosis with current pathological fracture, vertebra(e), subsequent encounter for fracture with routine healing: Secondary | ICD-10-CM | POA: Diagnosis not present

## 2015-09-26 DIAGNOSIS — I7121 Aneurysm of the ascending aorta, without rupture: Secondary | ICD-10-CM

## 2015-09-26 DIAGNOSIS — I712 Thoracic aortic aneurysm, without rupture: Secondary | ICD-10-CM

## 2015-09-26 DIAGNOSIS — M8468XD Pathological fracture in other disease, other site, subsequent encounter for fracture with routine healing: Secondary | ICD-10-CM | POA: Diagnosis not present

## 2015-09-26 DIAGNOSIS — M797 Fibromyalgia: Secondary | ICD-10-CM | POA: Diagnosis not present

## 2015-09-26 DIAGNOSIS — F419 Anxiety disorder, unspecified: Secondary | ICD-10-CM | POA: Diagnosis not present

## 2015-09-26 DIAGNOSIS — J189 Pneumonia, unspecified organism: Secondary | ICD-10-CM | POA: Diagnosis not present

## 2015-09-27 DIAGNOSIS — I712 Thoracic aortic aneurysm, without rupture: Secondary | ICD-10-CM | POA: Diagnosis not present

## 2015-09-27 DIAGNOSIS — M8468XD Pathological fracture in other disease, other site, subsequent encounter for fracture with routine healing: Secondary | ICD-10-CM | POA: Diagnosis not present

## 2015-09-27 DIAGNOSIS — F419 Anxiety disorder, unspecified: Secondary | ICD-10-CM | POA: Diagnosis not present

## 2015-09-27 DIAGNOSIS — M8008XD Age-related osteoporosis with current pathological fracture, vertebra(e), subsequent encounter for fracture with routine healing: Secondary | ICD-10-CM | POA: Diagnosis not present

## 2015-09-27 DIAGNOSIS — I10 Essential (primary) hypertension: Secondary | ICD-10-CM | POA: Diagnosis not present

## 2015-09-27 DIAGNOSIS — M797 Fibromyalgia: Secondary | ICD-10-CM | POA: Diagnosis not present

## 2015-09-28 DIAGNOSIS — I10 Essential (primary) hypertension: Secondary | ICD-10-CM | POA: Diagnosis not present

## 2015-09-28 DIAGNOSIS — F419 Anxiety disorder, unspecified: Secondary | ICD-10-CM | POA: Diagnosis not present

## 2015-09-28 DIAGNOSIS — M797 Fibromyalgia: Secondary | ICD-10-CM | POA: Diagnosis not present

## 2015-09-28 DIAGNOSIS — M8468XD Pathological fracture in other disease, other site, subsequent encounter for fracture with routine healing: Secondary | ICD-10-CM | POA: Diagnosis not present

## 2015-09-28 DIAGNOSIS — I712 Thoracic aortic aneurysm, without rupture: Secondary | ICD-10-CM | POA: Diagnosis not present

## 2015-09-28 DIAGNOSIS — M8008XD Age-related osteoporosis with current pathological fracture, vertebra(e), subsequent encounter for fracture with routine healing: Secondary | ICD-10-CM | POA: Diagnosis not present

## 2015-09-29 DIAGNOSIS — M8468XD Pathological fracture in other disease, other site, subsequent encounter for fracture with routine healing: Secondary | ICD-10-CM | POA: Diagnosis not present

## 2015-09-29 DIAGNOSIS — I10 Essential (primary) hypertension: Secondary | ICD-10-CM | POA: Diagnosis not present

## 2015-09-29 DIAGNOSIS — M8008XD Age-related osteoporosis with current pathological fracture, vertebra(e), subsequent encounter for fracture with routine healing: Secondary | ICD-10-CM | POA: Diagnosis not present

## 2015-09-29 DIAGNOSIS — M797 Fibromyalgia: Secondary | ICD-10-CM | POA: Diagnosis not present

## 2015-09-29 DIAGNOSIS — F419 Anxiety disorder, unspecified: Secondary | ICD-10-CM | POA: Diagnosis not present

## 2015-09-29 DIAGNOSIS — I712 Thoracic aortic aneurysm, without rupture: Secondary | ICD-10-CM | POA: Diagnosis not present

## 2015-09-30 ENCOUNTER — Other Ambulatory Visit: Payer: Self-pay | Admitting: Cardiothoracic Surgery

## 2015-09-30 DIAGNOSIS — F419 Anxiety disorder, unspecified: Secondary | ICD-10-CM | POA: Diagnosis not present

## 2015-09-30 DIAGNOSIS — I10 Essential (primary) hypertension: Secondary | ICD-10-CM | POA: Diagnosis not present

## 2015-09-30 DIAGNOSIS — M8468XD Pathological fracture in other disease, other site, subsequent encounter for fracture with routine healing: Secondary | ICD-10-CM | POA: Diagnosis not present

## 2015-09-30 DIAGNOSIS — I712 Thoracic aortic aneurysm, without rupture: Secondary | ICD-10-CM | POA: Diagnosis not present

## 2015-09-30 DIAGNOSIS — M8008XD Age-related osteoporosis with current pathological fracture, vertebra(e), subsequent encounter for fracture with routine healing: Secondary | ICD-10-CM | POA: Diagnosis not present

## 2015-09-30 DIAGNOSIS — M797 Fibromyalgia: Secondary | ICD-10-CM | POA: Diagnosis not present

## 2015-10-02 DIAGNOSIS — M797 Fibromyalgia: Secondary | ICD-10-CM | POA: Diagnosis not present

## 2015-10-02 DIAGNOSIS — M8468XD Pathological fracture in other disease, other site, subsequent encounter for fracture with routine healing: Secondary | ICD-10-CM | POA: Diagnosis not present

## 2015-10-02 DIAGNOSIS — I10 Essential (primary) hypertension: Secondary | ICD-10-CM | POA: Diagnosis not present

## 2015-10-02 DIAGNOSIS — I712 Thoracic aortic aneurysm, without rupture: Secondary | ICD-10-CM | POA: Diagnosis not present

## 2015-10-02 DIAGNOSIS — M8008XD Age-related osteoporosis with current pathological fracture, vertebra(e), subsequent encounter for fracture with routine healing: Secondary | ICD-10-CM | POA: Diagnosis not present

## 2015-10-02 DIAGNOSIS — F419 Anxiety disorder, unspecified: Secondary | ICD-10-CM | POA: Diagnosis not present

## 2015-10-03 DIAGNOSIS — M5023 Other cervical disc displacement, cervicothoracic region: Secondary | ICD-10-CM | POA: Diagnosis not present

## 2015-10-03 DIAGNOSIS — M542 Cervicalgia: Secondary | ICD-10-CM | POA: Diagnosis not present

## 2015-10-03 DIAGNOSIS — I10 Essential (primary) hypertension: Secondary | ICD-10-CM | POA: Diagnosis not present

## 2015-10-03 DIAGNOSIS — M5412 Radiculopathy, cervical region: Secondary | ICD-10-CM | POA: Diagnosis not present

## 2015-10-03 DIAGNOSIS — M810 Age-related osteoporosis without current pathological fracture: Secondary | ICD-10-CM | POA: Diagnosis not present

## 2015-10-04 DIAGNOSIS — I10 Essential (primary) hypertension: Secondary | ICD-10-CM | POA: Diagnosis not present

## 2015-10-04 DIAGNOSIS — M797 Fibromyalgia: Secondary | ICD-10-CM | POA: Diagnosis not present

## 2015-10-04 DIAGNOSIS — M8008XD Age-related osteoporosis with current pathological fracture, vertebra(e), subsequent encounter for fracture with routine healing: Secondary | ICD-10-CM | POA: Diagnosis not present

## 2015-10-04 DIAGNOSIS — I712 Thoracic aortic aneurysm, without rupture: Secondary | ICD-10-CM | POA: Diagnosis not present

## 2015-10-04 DIAGNOSIS — M8468XD Pathological fracture in other disease, other site, subsequent encounter for fracture with routine healing: Secondary | ICD-10-CM | POA: Diagnosis not present

## 2015-10-04 DIAGNOSIS — F419 Anxiety disorder, unspecified: Secondary | ICD-10-CM | POA: Diagnosis not present

## 2015-10-05 DIAGNOSIS — I712 Thoracic aortic aneurysm, without rupture: Secondary | ICD-10-CM | POA: Diagnosis not present

## 2015-10-05 DIAGNOSIS — M797 Fibromyalgia: Secondary | ICD-10-CM | POA: Diagnosis not present

## 2015-10-05 DIAGNOSIS — M8008XD Age-related osteoporosis with current pathological fracture, vertebra(e), subsequent encounter for fracture with routine healing: Secondary | ICD-10-CM | POA: Diagnosis not present

## 2015-10-05 DIAGNOSIS — M8468XD Pathological fracture in other disease, other site, subsequent encounter for fracture with routine healing: Secondary | ICD-10-CM | POA: Diagnosis not present

## 2015-10-05 DIAGNOSIS — F419 Anxiety disorder, unspecified: Secondary | ICD-10-CM | POA: Diagnosis not present

## 2015-10-05 DIAGNOSIS — I10 Essential (primary) hypertension: Secondary | ICD-10-CM | POA: Diagnosis not present

## 2015-10-07 DIAGNOSIS — F419 Anxiety disorder, unspecified: Secondary | ICD-10-CM | POA: Diagnosis not present

## 2015-10-07 DIAGNOSIS — I10 Essential (primary) hypertension: Secondary | ICD-10-CM | POA: Diagnosis not present

## 2015-10-07 DIAGNOSIS — I712 Thoracic aortic aneurysm, without rupture: Secondary | ICD-10-CM | POA: Diagnosis not present

## 2015-10-07 DIAGNOSIS — M8468XD Pathological fracture in other disease, other site, subsequent encounter for fracture with routine healing: Secondary | ICD-10-CM | POA: Diagnosis not present

## 2015-10-07 DIAGNOSIS — M8008XD Age-related osteoporosis with current pathological fracture, vertebra(e), subsequent encounter for fracture with routine healing: Secondary | ICD-10-CM | POA: Diagnosis not present

## 2015-10-07 DIAGNOSIS — M797 Fibromyalgia: Secondary | ICD-10-CM | POA: Diagnosis not present

## 2015-10-11 DIAGNOSIS — M797 Fibromyalgia: Secondary | ICD-10-CM | POA: Diagnosis not present

## 2015-10-11 DIAGNOSIS — I10 Essential (primary) hypertension: Secondary | ICD-10-CM | POA: Diagnosis not present

## 2015-10-11 DIAGNOSIS — M8008XD Age-related osteoporosis with current pathological fracture, vertebra(e), subsequent encounter for fracture with routine healing: Secondary | ICD-10-CM | POA: Diagnosis not present

## 2015-10-11 DIAGNOSIS — F419 Anxiety disorder, unspecified: Secondary | ICD-10-CM | POA: Diagnosis not present

## 2015-10-11 DIAGNOSIS — M8468XD Pathological fracture in other disease, other site, subsequent encounter for fracture with routine healing: Secondary | ICD-10-CM | POA: Diagnosis not present

## 2015-10-11 DIAGNOSIS — I712 Thoracic aortic aneurysm, without rupture: Secondary | ICD-10-CM | POA: Diagnosis not present

## 2015-10-12 ENCOUNTER — Ambulatory Visit (INDEPENDENT_AMBULATORY_CARE_PROVIDER_SITE_OTHER): Payer: Medicare Other | Admitting: Cardiothoracic Surgery

## 2015-10-12 ENCOUNTER — Inpatient Hospital Stay: Admit: 2015-10-12 | Payer: Medicare Other

## 2015-10-12 ENCOUNTER — Encounter: Payer: Self-pay | Admitting: Cardiothoracic Surgery

## 2015-10-12 VITALS — BP 128/88 | HR 92 | Resp 20 | Ht 60.0 in | Wt 105.0 lb

## 2015-10-12 DIAGNOSIS — M8468XD Pathological fracture in other disease, other site, subsequent encounter for fracture with routine healing: Secondary | ICD-10-CM | POA: Diagnosis not present

## 2015-10-12 DIAGNOSIS — M8008XD Age-related osteoporosis with current pathological fracture, vertebra(e), subsequent encounter for fracture with routine healing: Secondary | ICD-10-CM | POA: Diagnosis not present

## 2015-10-12 DIAGNOSIS — I7121 Aneurysm of the ascending aorta, without rupture: Secondary | ICD-10-CM

## 2015-10-12 DIAGNOSIS — I712 Thoracic aortic aneurysm, without rupture: Secondary | ICD-10-CM

## 2015-10-12 DIAGNOSIS — M797 Fibromyalgia: Secondary | ICD-10-CM | POA: Diagnosis not present

## 2015-10-12 DIAGNOSIS — I10 Essential (primary) hypertension: Secondary | ICD-10-CM | POA: Diagnosis not present

## 2015-10-12 DIAGNOSIS — Z853 Personal history of malignant neoplasm of breast: Secondary | ICD-10-CM | POA: Diagnosis not present

## 2015-10-12 DIAGNOSIS — F419 Anxiety disorder, unspecified: Secondary | ICD-10-CM | POA: Diagnosis not present

## 2015-10-12 NOTE — Progress Notes (Signed)
PCP is [name], MD Referring Provider is [name], MD  Chief Complaint  Patient presents with  . Thoracic Aortic Aneurysm    6 month f/u with CTA Chest 09/04/15    HPI: A scheduled office visit for follow-up of 4.3 cm fusiform ascending thoracic aneurysm first noted 2013 and without significant change. This has been asymptomatic. She has hypertension and is on chronic beta blockade.  The patient recently underwent anterior cervical disc fusion without cardiac complication. She is still wearing a cervical collar and has not regained the weight she lost after surgery.   Past Medical History  Diagnosis Date  . GERD (gastroesophageal reflux disease)   . Hypercholesteremia   . Osteoporosis   . Thyroid disease   . Rib fractures   . Fracture lumbar vertebra-closed ([place])   . Fracture of thoracic vertebra, closed ([place])   . History of blood clots   . Cataract 3 and 10/92    bilateral  . Hypertension     DR Orinda Kenner  . Aneurysm ([place])   . Osteoporosis   . DVT (deep venous thrombosis) ([place]) 08/2011    LL extremity   . History of radiation therapy 01/2012    R breast  . Hypothyroidism   . Anxiety   . Shortness of breath   . H/O hiatal hernia   . Cancer ([place]) 10/12    bx/right breast  . 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 Fibromyalgia     Past Surgical History  Procedure Laterality Date  . Hernia repair  10/16/2006    [facility] - Dr Hassell Done  . Appendectomy  1940  . Tonsillectomy  1944  . Cataract extraction  1992  . Eye surgery  1940, 1956  . Mastectomy, partial  10/17/2011    Procedure: MASTECTOMY PARTIAL;  Surgeon: Haywood Lasso, MD;  Location: [facility];  Service: General;  Laterality: Right;  needle guided  . Breast surgery    . Kyphoplasty N/A 01/26/2013    Procedure: KYPHOPLASTY;  Surgeon: Kristeen Miss, MD;  Location: [facility];  Service: Neurosurgery;  Laterality: N/A;  T11 and L1  . Anterior cervical decomp/discectomy fusion N/A  09/09/2015    Procedure: Anterior Cervical Decompression and Fusion Cervical seven-Thorasic one ;  Surgeon: Erline Levine, MD;  Location: [facility];  Service: Neurosurgery;  Laterality: N/A;    Family History  Problem Relation Age of Onset  . Heart disease Mother   . Heart disease Maternal Uncle   . Heart disease Maternal Grandfather     Social History Social History  Substance Use Topics  . Smoking status: Never Smoker   . Smokeless tobacco: Never Used  . Alcohol Use: No    Current Outpatient Prescriptions  Medication Sig Dispense Refill  . amitriptyline (ELAVIL) 50 MG tablet Take 50 mg by mouth at bedtime.    Marland Kitchen amLODipine (NORVASC) 5 MG tablet Take 1 tablet (5 mg total) by mouth daily. 30 tablet 11  . aspirin 81 MG tablet Take 81 mg by mouth daily.    Marland Kitchen BONIVA 150 MG tablet Take 150 mg by mouth every 30 (thirty) days.     . calcium citrate-vitamin D (CITRACAL+D) 315-200 MG-UNIT per tablet Take 1 tablet by mouth 2 (two) times daily. 60 tablet 11  . cholecalciferol (VITAMIN D) 1000 UNITS tablet Take 1,000 Units by mouth daily. Vitamin D3    . HYDROcodone-acetaminophen (NORCO/VICODIN) 5-325 MG tablet Take 1-2 tablets by mouth every  4 (four) hours as needed (mild pain). 30 tablet 0  . levothyroxine (SYNTHROID, LEVOTHROID) 112 MCG tablet Take 112 mcg by mouth daily.    . metoprolol succinate (TOPROL-XL) 25 MG 24 hr tablet TAKE 1 TABLET EACH DAY. 30 tablet 11  . Multiple Vitamin (MULTIVITAMIN) tablet Take 1 tablet by mouth daily.     . pravastatin (PRAVACHOL) 20 MG tablet Take 20 mg by mouth every evening.     Marland Kitchen PRILOSEC OTC 20 MG tablet Take 20 mg by mouth daily.     . solifenacin (VESICARE) 5 MG tablet Take 5 mg by mouth daily.     No current facility-administered medications for this visit.    Allergies  Allergen Reactions  . Contrast Media [Iodinated Diagnostic Agents] Shortness Of Breath  . Iohexol Shortness Of Breath  . Penicillins Rash    Has patient had a PCN reaction  causing immediate rash, facial/tongue/throat swelling, SOB or lightheadedness with hypotension: No Has patient had a PCN reaction causing severe rash involving mucus membranes or skin necrosis: No Has patient had a PCN reaction that required hospitalization No Has patient had a PCN reaction occurring within the last 10 years: No If all of the above answers are "NO", then may proceed with Cephalosporin use.     Review of Systems   Positive for weight loss following the cervical spine fusion  No difficulty with the surgical incisions Still remains with chronic left upper arm and left hand pain No shortness of breath or productive cough No symptoms of angina or palpitation No abdominal pain or change in bowel habits No peripheral edema or calf tenderness Admits to depression over her husband being in [facility] BP 128/88 mmHg  Pulse 92  Resp 20  Ht 5' (1.524 m)  Wt 105 lb (47.628 kg)  BMI 20.51 kg/m2  SpO2 96% Physical Exam      Physical Exam  General: Chronically ill frail Caucasian female pleasant no distress HEENT: Normocephalic pupils equal , dentition adequate Neck: Supple without JVD, adenopathy, or bruit Chest: Clear to auscultation, symmetrical breath sounds, no rhonchi, no tenderness             or deformity  --  Severe chronic kyphosis of thoracic spine Cardiovascular: Regular rate and rhythm, no murmur, no gallop, peripheral pulses             palpable in all extremities Abdomen:  Soft, nontender, no palpable mass or organomegaly Extremities: Warm, well-perfused, no clubbing cyanosis edema or tenderness,              no venous stasis changes of the legs Rectal/GU: Deferred Neuro: Grossly non--focal and symmetrical throughout Skin: Clean and dry without rash or ulceration  Diagnostic Tests: CT of the thoracic aorta personally reviewed and discussed-consult with patient. There is no increase in diameter which remains at 4.3 cm. No evidence of mural hematoma  or ulceration  Impression: Stable fusiform aneurysm of ascending thoracic aorta Surgery not recommended at this time. Patient's risk for surgery would be prohibitive at her age and with her severe thoracic spine disease Continue blood pressure control and surveillance scans  Plan: Return for repeat CT scan in 1 year  Len Childs, MD [facility] [phone]

## 2015-10-13 DIAGNOSIS — I10 Essential (primary) hypertension: Secondary | ICD-10-CM | POA: Diagnosis not present

## 2015-10-13 DIAGNOSIS — M8008XD Age-related osteoporosis with current pathological fracture, vertebra(e), subsequent encounter for fracture with routine healing: Secondary | ICD-10-CM | POA: Diagnosis not present

## 2015-10-13 DIAGNOSIS — F419 Anxiety disorder, unspecified: Secondary | ICD-10-CM | POA: Diagnosis not present

## 2015-10-13 DIAGNOSIS — M8468XD Pathological fracture in other disease, other site, subsequent encounter for fracture with routine healing: Secondary | ICD-10-CM | POA: Diagnosis not present

## 2015-10-13 DIAGNOSIS — M797 Fibromyalgia: Secondary | ICD-10-CM | POA: Diagnosis not present

## 2015-10-13 DIAGNOSIS — I712 Thoracic aortic aneurysm, without rupture: Secondary | ICD-10-CM | POA: Diagnosis not present

## 2015-10-17 DIAGNOSIS — I10 Essential (primary) hypertension: Secondary | ICD-10-CM | POA: Diagnosis not present

## 2015-10-17 DIAGNOSIS — M8008XD Age-related osteoporosis with current pathological fracture, vertebra(e), subsequent encounter for fracture with routine healing: Secondary | ICD-10-CM | POA: Diagnosis not present

## 2015-10-17 DIAGNOSIS — I712 Thoracic aortic aneurysm, without rupture: Secondary | ICD-10-CM | POA: Diagnosis not present

## 2015-10-17 DIAGNOSIS — M8468XD Pathological fracture in other disease, other site, subsequent encounter for fracture with routine healing: Secondary | ICD-10-CM | POA: Diagnosis not present

## 2015-10-17 DIAGNOSIS — M797 Fibromyalgia: Secondary | ICD-10-CM | POA: Diagnosis not present

## 2015-10-17 DIAGNOSIS — F419 Anxiety disorder, unspecified: Secondary | ICD-10-CM | POA: Diagnosis not present

## 2015-10-18 DIAGNOSIS — I712 Thoracic aortic aneurysm, without rupture: Secondary | ICD-10-CM | POA: Diagnosis not present

## 2015-10-18 DIAGNOSIS — M797 Fibromyalgia: Secondary | ICD-10-CM | POA: Diagnosis not present

## 2015-10-18 DIAGNOSIS — F419 Anxiety disorder, unspecified: Secondary | ICD-10-CM | POA: Diagnosis not present

## 2015-10-18 DIAGNOSIS — I10 Essential (primary) hypertension: Secondary | ICD-10-CM | POA: Diagnosis not present

## 2015-10-18 DIAGNOSIS — M8468XD Pathological fracture in other disease, other site, subsequent encounter for fracture with routine healing: Secondary | ICD-10-CM | POA: Diagnosis not present

## 2015-10-18 DIAGNOSIS — M8008XD Age-related osteoporosis with current pathological fracture, vertebra(e), subsequent encounter for fracture with routine healing: Secondary | ICD-10-CM | POA: Diagnosis not present

## 2015-10-19 DIAGNOSIS — I10 Essential (primary) hypertension: Secondary | ICD-10-CM | POA: Diagnosis not present

## 2015-10-19 DIAGNOSIS — I712 Thoracic aortic aneurysm, without rupture: Secondary | ICD-10-CM | POA: Diagnosis not present

## 2015-10-19 DIAGNOSIS — F419 Anxiety disorder, unspecified: Secondary | ICD-10-CM | POA: Diagnosis not present

## 2015-10-19 DIAGNOSIS — M8468XD Pathological fracture in other disease, other site, subsequent encounter for fracture with routine healing: Secondary | ICD-10-CM | POA: Diagnosis not present

## 2015-10-19 DIAGNOSIS — M797 Fibromyalgia: Secondary | ICD-10-CM | POA: Diagnosis not present

## 2015-10-19 DIAGNOSIS — M8008XD Age-related osteoporosis with current pathological fracture, vertebra(e), subsequent encounter for fracture with routine healing: Secondary | ICD-10-CM | POA: Diagnosis not present

## 2015-10-20 DIAGNOSIS — M8008XD Age-related osteoporosis with current pathological fracture, vertebra(e), subsequent encounter for fracture with routine healing: Secondary | ICD-10-CM | POA: Diagnosis not present

## 2015-10-20 DIAGNOSIS — M797 Fibromyalgia: Secondary | ICD-10-CM | POA: Diagnosis not present

## 2015-10-20 DIAGNOSIS — I10 Essential (primary) hypertension: Secondary | ICD-10-CM | POA: Diagnosis not present

## 2015-10-20 DIAGNOSIS — F419 Anxiety disorder, unspecified: Secondary | ICD-10-CM | POA: Diagnosis not present

## 2015-10-20 DIAGNOSIS — M8468XD Pathological fracture in other disease, other site, subsequent encounter for fracture with routine healing: Secondary | ICD-10-CM | POA: Diagnosis not present

## 2015-10-20 DIAGNOSIS — I712 Thoracic aortic aneurysm, without rupture: Secondary | ICD-10-CM | POA: Diagnosis not present

## 2015-10-24 DIAGNOSIS — I712 Thoracic aortic aneurysm, without rupture: Secondary | ICD-10-CM | POA: Diagnosis not present

## 2015-10-24 DIAGNOSIS — M797 Fibromyalgia: Secondary | ICD-10-CM | POA: Diagnosis not present

## 2015-10-24 DIAGNOSIS — F419 Anxiety disorder, unspecified: Secondary | ICD-10-CM | POA: Diagnosis not present

## 2015-10-24 DIAGNOSIS — M8008XD Age-related osteoporosis with current pathological fracture, vertebra(e), subsequent encounter for fracture with routine healing: Secondary | ICD-10-CM | POA: Diagnosis not present

## 2015-10-24 DIAGNOSIS — I10 Essential (primary) hypertension: Secondary | ICD-10-CM | POA: Diagnosis not present

## 2015-10-24 DIAGNOSIS — M8468XD Pathological fracture in other disease, other site, subsequent encounter for fracture with routine healing: Secondary | ICD-10-CM | POA: Diagnosis not present

## 2015-10-26 DIAGNOSIS — I10 Essential (primary) hypertension: Secondary | ICD-10-CM | POA: Diagnosis not present

## 2015-10-26 DIAGNOSIS — F419 Anxiety disorder, unspecified: Secondary | ICD-10-CM | POA: Diagnosis not present

## 2015-10-26 DIAGNOSIS — M8008XD Age-related osteoporosis with current pathological fracture, vertebra(e), subsequent encounter for fracture with routine healing: Secondary | ICD-10-CM | POA: Diagnosis not present

## 2015-10-26 DIAGNOSIS — M797 Fibromyalgia: Secondary | ICD-10-CM | POA: Diagnosis not present

## 2015-10-26 DIAGNOSIS — M8468XD Pathological fracture in other disease, other site, subsequent encounter for fracture with routine healing: Secondary | ICD-10-CM | POA: Diagnosis not present

## 2015-10-26 DIAGNOSIS — I712 Thoracic aortic aneurysm, without rupture: Secondary | ICD-10-CM | POA: Diagnosis not present

## 2015-10-27 DIAGNOSIS — M797 Fibromyalgia: Secondary | ICD-10-CM | POA: Diagnosis not present

## 2015-10-27 DIAGNOSIS — I10 Essential (primary) hypertension: Secondary | ICD-10-CM | POA: Diagnosis not present

## 2015-10-27 DIAGNOSIS — M8468XD Pathological fracture in other disease, other site, subsequent encounter for fracture with routine healing: Secondary | ICD-10-CM | POA: Diagnosis not present

## 2015-10-27 DIAGNOSIS — F419 Anxiety disorder, unspecified: Secondary | ICD-10-CM | POA: Diagnosis not present

## 2015-10-27 DIAGNOSIS — I712 Thoracic aortic aneurysm, without rupture: Secondary | ICD-10-CM | POA: Diagnosis not present

## 2015-10-27 DIAGNOSIS — M8008XD Age-related osteoporosis with current pathological fracture, vertebra(e), subsequent encounter for fracture with routine healing: Secondary | ICD-10-CM | POA: Diagnosis not present

## 2015-10-31 DIAGNOSIS — F419 Anxiety disorder, unspecified: Secondary | ICD-10-CM | POA: Diagnosis not present

## 2015-10-31 DIAGNOSIS — I712 Thoracic aortic aneurysm, without rupture: Secondary | ICD-10-CM | POA: Diagnosis not present

## 2015-10-31 DIAGNOSIS — M797 Fibromyalgia: Secondary | ICD-10-CM | POA: Diagnosis not present

## 2015-10-31 DIAGNOSIS — I10 Essential (primary) hypertension: Secondary | ICD-10-CM | POA: Diagnosis not present

## 2015-10-31 DIAGNOSIS — M8008XD Age-related osteoporosis with current pathological fracture, vertebra(e), subsequent encounter for fracture with routine healing: Secondary | ICD-10-CM | POA: Diagnosis not present

## 2015-10-31 DIAGNOSIS — M8468XD Pathological fracture in other disease, other site, subsequent encounter for fracture with routine healing: Secondary | ICD-10-CM | POA: Diagnosis not present

## 2015-11-16 DIAGNOSIS — M5023 Other cervical disc displacement, cervicothoracic region: Secondary | ICD-10-CM | POA: Diagnosis not present

## 2015-11-16 DIAGNOSIS — M810 Age-related osteoporosis without current pathological fracture: Secondary | ICD-10-CM | POA: Diagnosis not present

## 2015-11-16 DIAGNOSIS — M4120 Other idiopathic scoliosis, site unspecified: Secondary | ICD-10-CM | POA: Diagnosis not present

## 2015-11-16 DIAGNOSIS — M5412 Radiculopathy, cervical region: Secondary | ICD-10-CM | POA: Diagnosis not present

## 2016-01-11 DIAGNOSIS — I10 Essential (primary) hypertension: Secondary | ICD-10-CM | POA: Diagnosis not present

## 2016-01-11 DIAGNOSIS — M5412 Radiculopathy, cervical region: Secondary | ICD-10-CM | POA: Diagnosis not present

## 2016-01-11 DIAGNOSIS — M810 Age-related osteoporosis without current pathological fracture: Secondary | ICD-10-CM | POA: Diagnosis not present

## 2016-01-11 DIAGNOSIS — M542 Cervicalgia: Secondary | ICD-10-CM | POA: Diagnosis not present

## 2016-01-11 DIAGNOSIS — M4120 Other idiopathic scoliosis, site unspecified: Secondary | ICD-10-CM | POA: Diagnosis not present

## 2016-01-11 DIAGNOSIS — Z6820 Body mass index (BMI) 20.0-20.9, adult: Secondary | ICD-10-CM | POA: Diagnosis not present

## 2016-02-07 ENCOUNTER — Other Ambulatory Visit: Payer: Self-pay | Admitting: Internal Medicine

## 2016-02-07 DIAGNOSIS — Z1389 Encounter for screening for other disorder: Secondary | ICD-10-CM | POA: Diagnosis not present

## 2016-02-07 DIAGNOSIS — I10 Essential (primary) hypertension: Secondary | ICD-10-CM | POA: Diagnosis not present

## 2016-02-07 DIAGNOSIS — R636 Underweight: Secondary | ICD-10-CM | POA: Diagnosis not present

## 2016-02-07 DIAGNOSIS — M546 Pain in thoracic spine: Secondary | ICD-10-CM | POA: Diagnosis not present

## 2016-02-07 DIAGNOSIS — G8929 Other chronic pain: Secondary | ICD-10-CM | POA: Diagnosis not present

## 2016-02-07 DIAGNOSIS — M810 Age-related osteoporosis without current pathological fracture: Secondary | ICD-10-CM

## 2016-02-07 DIAGNOSIS — M5030 Other cervical disc degeneration, unspecified cervical region: Secondary | ICD-10-CM | POA: Diagnosis not present

## 2016-02-07 DIAGNOSIS — Z23 Encounter for immunization: Secondary | ICD-10-CM | POA: Diagnosis not present

## 2016-02-07 DIAGNOSIS — Z79899 Other long term (current) drug therapy: Secondary | ICD-10-CM | POA: Diagnosis not present

## 2016-02-07 DIAGNOSIS — K219 Gastro-esophageal reflux disease without esophagitis: Secondary | ICD-10-CM | POA: Diagnosis not present

## 2016-02-07 DIAGNOSIS — D509 Iron deficiency anemia, unspecified: Secondary | ICD-10-CM | POA: Diagnosis not present

## 2016-02-07 DIAGNOSIS — E039 Hypothyroidism, unspecified: Secondary | ICD-10-CM | POA: Diagnosis not present

## 2016-02-07 DIAGNOSIS — Z0000 Encounter for general adult medical examination without abnormal findings: Secondary | ICD-10-CM | POA: Diagnosis not present

## 2016-02-07 DIAGNOSIS — E782 Mixed hyperlipidemia: Secondary | ICD-10-CM | POA: Diagnosis not present

## 2016-02-08 DIAGNOSIS — M5412 Radiculopathy, cervical region: Secondary | ICD-10-CM | POA: Diagnosis not present

## 2016-02-08 DIAGNOSIS — M542 Cervicalgia: Secondary | ICD-10-CM | POA: Diagnosis not present

## 2016-02-08 DIAGNOSIS — M810 Age-related osteoporosis without current pathological fracture: Secondary | ICD-10-CM | POA: Diagnosis not present

## 2016-02-08 DIAGNOSIS — M5023 Other cervical disc displacement, cervicothoracic region: Secondary | ICD-10-CM | POA: Diagnosis not present

## 2016-02-08 DIAGNOSIS — M4120 Other idiopathic scoliosis, site unspecified: Secondary | ICD-10-CM | POA: Diagnosis not present

## 2016-02-23 DIAGNOSIS — N3281 Overactive bladder: Secondary | ICD-10-CM | POA: Diagnosis not present

## 2016-02-23 DIAGNOSIS — Z0000 Encounter for general adult medical examination without abnormal findings: Secondary | ICD-10-CM | POA: Diagnosis not present

## 2016-02-28 ENCOUNTER — Ambulatory Visit
Admission: RE | Admit: 2016-02-28 | Discharge: 2016-02-28 | Disposition: A | Discharge status: Home / Self Care | Payer: Medicare Other | Source: Ambulatory Visit | Attending: Internal Medicine | Admitting: Internal Medicine

## 2016-02-28 DIAGNOSIS — M810 Age-related osteoporosis without current pathological fracture: Secondary | ICD-10-CM | POA: Diagnosis not present

## 2016-02-28 DIAGNOSIS — Z780 Asymptomatic menopausal state: Secondary | ICD-10-CM | POA: Diagnosis not present

## 2016-03-19 DIAGNOSIS — L821 Other seborrheic keratosis: Secondary | ICD-10-CM | POA: Diagnosis not present

## 2016-03-19 DIAGNOSIS — L603 Nail dystrophy: Secondary | ICD-10-CM | POA: Diagnosis not present

## 2016-03-19 DIAGNOSIS — Z85828 Personal history of other malignant neoplasm of skin: Secondary | ICD-10-CM | POA: Diagnosis not present

## 2016-03-19 DIAGNOSIS — D1801 Hemangioma of skin and subcutaneous tissue: Secondary | ICD-10-CM | POA: Diagnosis not present

## 2016-04-05 DIAGNOSIS — Z961 Presence of intraocular lens: Secondary | ICD-10-CM | POA: Diagnosis not present

## 2016-04-05 DIAGNOSIS — H31003 Unspecified chorioretinal scars, bilateral: Secondary | ICD-10-CM | POA: Diagnosis not present

## 2016-04-05 DIAGNOSIS — H5213 Myopia, bilateral: Secondary | ICD-10-CM | POA: Diagnosis not present

## 2016-04-23 DIAGNOSIS — L03032 Cellulitis of left toe: Secondary | ICD-10-CM | POA: Diagnosis not present

## 2016-06-08 DIAGNOSIS — R339 Retention of urine, unspecified: Secondary | ICD-10-CM | POA: Diagnosis not present

## 2016-06-08 DIAGNOSIS — N814 Uterovaginal prolapse, unspecified: Secondary | ICD-10-CM | POA: Diagnosis not present

## 2016-06-08 DIAGNOSIS — R109 Unspecified abdominal pain: Secondary | ICD-10-CM | POA: Diagnosis not present

## 2016-06-08 DIAGNOSIS — N952 Postmenopausal atrophic vaginitis: Secondary | ICD-10-CM | POA: Diagnosis not present

## 2016-08-09 DIAGNOSIS — N814 Uterovaginal prolapse, unspecified: Secondary | ICD-10-CM | POA: Diagnosis not present

## 2016-08-10 DIAGNOSIS — Z23 Encounter for immunization: Secondary | ICD-10-CM | POA: Diagnosis not present

## 2016-08-10 DIAGNOSIS — M797 Fibromyalgia: Secondary | ICD-10-CM | POA: Diagnosis not present

## 2016-08-10 DIAGNOSIS — I10 Essential (primary) hypertension: Secondary | ICD-10-CM | POA: Diagnosis not present

## 2016-08-10 DIAGNOSIS — J3489 Other specified disorders of nose and nasal sinuses: Secondary | ICD-10-CM | POA: Diagnosis not present

## 2016-08-10 DIAGNOSIS — M1990 Unspecified osteoarthritis, unspecified site: Secondary | ICD-10-CM | POA: Diagnosis not present

## 2016-09-28 ENCOUNTER — Other Ambulatory Visit: Payer: Self-pay | Admitting: Cardiothoracic Surgery

## 2016-09-28 DIAGNOSIS — I712 Thoracic aortic aneurysm, without rupture, unspecified: Secondary | ICD-10-CM

## 2016-10-01 IMAGING — CR DG CHEST 1V PORT
1 series · 1 of 1 positions shown · non-contrast
Comparison: 09/07/2015

CLINICAL DATA: Pneumonia

EXAM:
PORTABLE CHEST 1 VIEW

[AP]
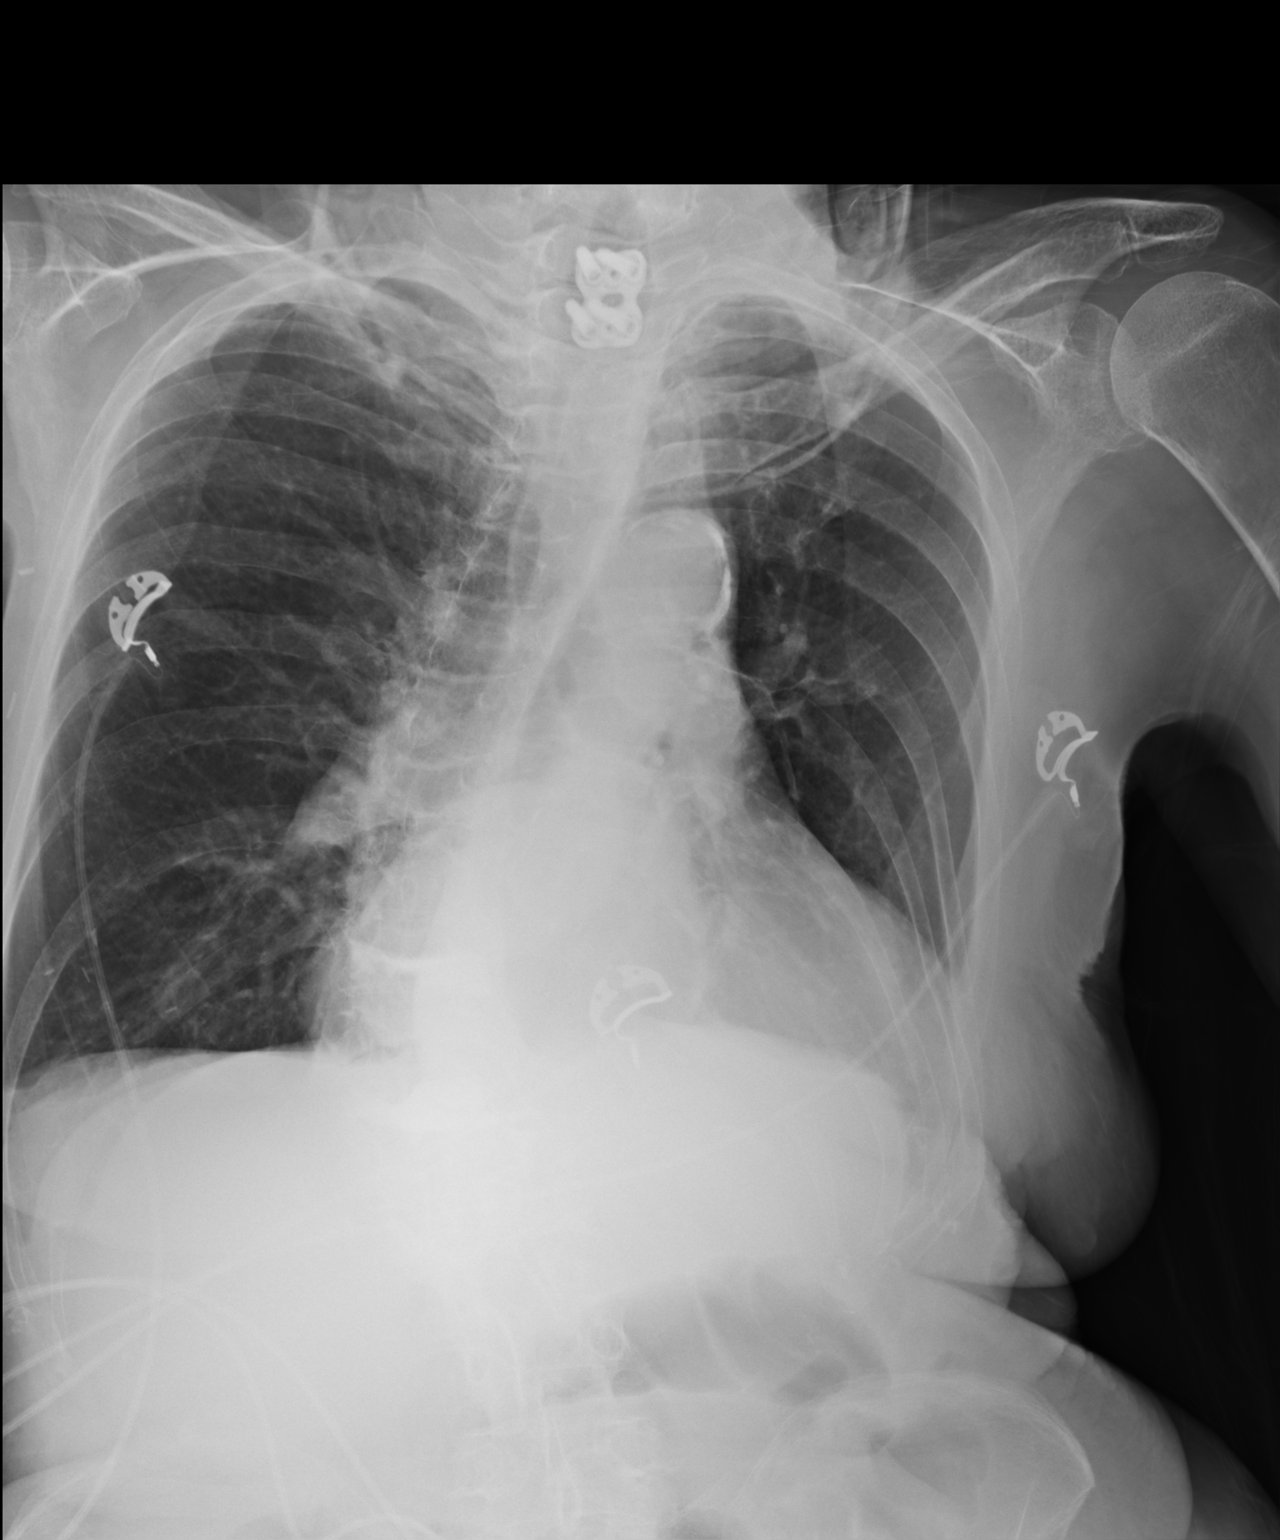

[1 of 1 positions shown; findings below may reference images not displayed]

FINDINGS: Lungs are essentially clear. No focal consolidation. No pleural
effusion or pneumothorax.

Cardiomegaly.

Moderate hiatal hernia.

Cervical spine fixation hardware.
IMPRESSION: No evidence of acute cardiopulmonary disease.

## 2016-10-31 ENCOUNTER — Other Ambulatory Visit: Payer: Medicare Other

## 2016-10-31 ENCOUNTER — Ambulatory Visit: Payer: Medicare Other | Admitting: Cardiothoracic Surgery

## 2016-11-09 DIAGNOSIS — N814 Uterovaginal prolapse, unspecified: Secondary | ICD-10-CM | POA: Diagnosis not present

## 2016-11-27 ENCOUNTER — Encounter (HOSPITAL_COMMUNITY): Payer: Self-pay

## 2016-11-27 ENCOUNTER — Inpatient Hospital Stay (HOSPITAL_COMMUNITY)
Admission: EM | Admit: 2016-11-27 | Discharge: 2016-12-10 | DRG: 291 | Disposition: A | Discharge status: Home Health | Payer: Medicare Other | Attending: Internal Medicine | Admitting: Internal Medicine

## 2016-11-27 ENCOUNTER — Emergency Department (HOSPITAL_COMMUNITY): Payer: Medicare Other

## 2016-11-27 DIAGNOSIS — Z7982 Long term (current) use of aspirin: Secondary | ICD-10-CM | POA: Diagnosis not present

## 2016-11-27 DIAGNOSIS — M810 Age-related osteoporosis without current pathological fracture: Secondary | ICD-10-CM | POA: Diagnosis not present

## 2016-11-27 DIAGNOSIS — J9601 Acute respiratory failure with hypoxia: Secondary | ICD-10-CM | POA: Diagnosis present

## 2016-11-27 DIAGNOSIS — J470 Bronchiectasis with acute lower respiratory infection: Secondary | ICD-10-CM | POA: Diagnosis present

## 2016-11-27 DIAGNOSIS — I5031 Acute diastolic (congestive) heart failure: Secondary | ICD-10-CM | POA: Diagnosis not present

## 2016-11-27 DIAGNOSIS — I4891 Unspecified atrial fibrillation: Secondary | ICD-10-CM | POA: Diagnosis not present

## 2016-11-27 DIAGNOSIS — J189 Pneumonia, unspecified organism: Secondary | ICD-10-CM

## 2016-11-27 DIAGNOSIS — D72829 Elevated white blood cell count, unspecified: Secondary | ICD-10-CM | POA: Diagnosis present

## 2016-11-27 DIAGNOSIS — Z79899 Other long term (current) drug therapy: Secondary | ICD-10-CM | POA: Diagnosis not present

## 2016-11-27 DIAGNOSIS — I272 Pulmonary hypertension, unspecified: Secondary | ICD-10-CM | POA: Diagnosis present

## 2016-11-27 DIAGNOSIS — R51 Headache: Secondary | ICD-10-CM | POA: Diagnosis not present

## 2016-11-27 DIAGNOSIS — Z853 Personal history of malignant neoplasm of breast: Secondary | ICD-10-CM | POA: Diagnosis not present

## 2016-11-27 DIAGNOSIS — Z6821 Body mass index (BMI) 21.0-21.9, adult: Secondary | ICD-10-CM

## 2016-11-27 DIAGNOSIS — I712 Thoracic aortic aneurysm, without rupture: Secondary | ICD-10-CM | POA: Diagnosis present

## 2016-11-27 DIAGNOSIS — E7800 Pure hypercholesterolemia, unspecified: Secondary | ICD-10-CM | POA: Diagnosis present

## 2016-11-27 DIAGNOSIS — K219 Gastro-esophageal reflux disease without esophagitis: Secondary | ICD-10-CM | POA: Diagnosis present

## 2016-11-27 DIAGNOSIS — R42 Dizziness and giddiness: Secondary | ICD-10-CM | POA: Diagnosis not present

## 2016-11-27 DIAGNOSIS — D72828 Other elevated white blood cell count: Secondary | ICD-10-CM | POA: Diagnosis not present

## 2016-11-27 DIAGNOSIS — E876 Hypokalemia: Secondary | ICD-10-CM | POA: Diagnosis present

## 2016-11-27 DIAGNOSIS — R0600 Dyspnea, unspecified: Secondary | ICD-10-CM | POA: Diagnosis present

## 2016-11-27 DIAGNOSIS — I10 Essential (primary) hypertension: Secondary | ICD-10-CM | POA: Diagnosis present

## 2016-11-27 DIAGNOSIS — F419 Anxiety disorder, unspecified: Secondary | ICD-10-CM | POA: Diagnosis present

## 2016-11-27 DIAGNOSIS — I110 Hypertensive heart disease with heart failure: Secondary | ICD-10-CM | POA: Diagnosis not present

## 2016-11-27 DIAGNOSIS — I509 Heart failure, unspecified: Secondary | ICD-10-CM | POA: Diagnosis not present

## 2016-11-27 DIAGNOSIS — M797 Fibromyalgia: Secondary | ICD-10-CM | POA: Diagnosis present

## 2016-11-27 DIAGNOSIS — E440 Moderate protein-calorie malnutrition: Secondary | ICD-10-CM | POA: Diagnosis not present

## 2016-11-27 DIAGNOSIS — I481 Persistent atrial fibrillation: Secondary | ICD-10-CM | POA: Diagnosis not present

## 2016-11-27 DIAGNOSIS — R600 Localized edema: Secondary | ICD-10-CM | POA: Diagnosis present

## 2016-11-27 DIAGNOSIS — J439 Emphysema, unspecified: Secondary | ICD-10-CM | POA: Diagnosis not present

## 2016-11-27 DIAGNOSIS — E038 Other specified hypothyroidism: Secondary | ICD-10-CM | POA: Diagnosis not present

## 2016-11-27 DIAGNOSIS — I5033 Acute on chronic diastolic (congestive) heart failure: Secondary | ICD-10-CM | POA: Diagnosis not present

## 2016-11-27 DIAGNOSIS — Z923 Personal history of irradiation: Secondary | ICD-10-CM

## 2016-11-27 DIAGNOSIS — I340 Nonrheumatic mitral (valve) insufficiency: Secondary | ICD-10-CM | POA: Diagnosis not present

## 2016-11-27 DIAGNOSIS — R0609 Other forms of dyspnea: Secondary | ICD-10-CM | POA: Diagnosis not present

## 2016-11-27 DIAGNOSIS — Z981 Arthrodesis status: Secondary | ICD-10-CM | POA: Diagnosis not present

## 2016-11-27 DIAGNOSIS — I480 Paroxysmal atrial fibrillation: Secondary | ICD-10-CM | POA: Diagnosis not present

## 2016-11-27 DIAGNOSIS — R5383 Other fatigue: Secondary | ICD-10-CM | POA: Diagnosis not present

## 2016-11-27 DIAGNOSIS — R0602 Shortness of breath: Secondary | ICD-10-CM | POA: Diagnosis not present

## 2016-11-27 DIAGNOSIS — Z86718 Personal history of other venous thrombosis and embolism: Secondary | ICD-10-CM | POA: Diagnosis not present

## 2016-11-27 DIAGNOSIS — I341 Nonrheumatic mitral (valve) prolapse: Secondary | ICD-10-CM | POA: Diagnosis not present

## 2016-11-27 DIAGNOSIS — T501X5A Adverse effect of loop [high-ceiling] diuretics, initial encounter: Secondary | ICD-10-CM | POA: Diagnosis not present

## 2016-11-27 DIAGNOSIS — E039 Hypothyroidism, unspecified: Secondary | ICD-10-CM | POA: Diagnosis not present

## 2016-11-27 DIAGNOSIS — Z7901 Long term (current) use of anticoagulants: Secondary | ICD-10-CM | POA: Diagnosis not present

## 2016-11-27 DIAGNOSIS — J181 Lobar pneumonia, unspecified organism: Secondary | ICD-10-CM | POA: Diagnosis present

## 2016-11-27 DIAGNOSIS — I051 Rheumatic mitral insufficiency: Secondary | ICD-10-CM | POA: Diagnosis not present

## 2016-11-27 DIAGNOSIS — R05 Cough: Secondary | ICD-10-CM | POA: Diagnosis not present

## 2016-11-27 DIAGNOSIS — R000 Tachycardia, unspecified: Secondary | ICD-10-CM | POA: Diagnosis not present

## 2016-11-27 DIAGNOSIS — Z5181 Encounter for therapeutic drug level monitoring: Secondary | ICD-10-CM | POA: Diagnosis not present

## 2016-11-27 DIAGNOSIS — D0511 Intraductal carcinoma in situ of right breast: Secondary | ICD-10-CM | POA: Diagnosis present

## 2016-11-27 LAB — TSH: TSH: 2.994 u[IU]/mL (ref 0.350–4.500)

## 2016-11-27 LAB — CBC
HCT: 37.6 % (ref 36.0–46.0)
Hemoglobin: 12.9 g/dL (ref 12.0–15.0)
MCH: 29.1 pg (ref 26.0–34.0)
MCHC: 34.3 g/dL (ref 30.0–36.0)
MCV: 84.7 fL (ref 78.0–100.0)
PLATELETS: 246 10*3/uL (ref 150–400)
RBC: 4.44 MIL/uL (ref 3.87–5.11)
RDW: 14.5 % (ref 11.5–15.5)
WBC: 14.2 10*3/uL — AB (ref 4.0–10.5)

## 2016-11-27 LAB — BASIC METABOLIC PANEL
Anion gap: 13 (ref 5–15)
BUN: 29 mg/dL — AB (ref 6–20)
CO2: 22 mmol/L (ref 22–32)
CREATININE: 0.91 mg/dL (ref 0.44–1.00)
Calcium: 9.1 mg/dL (ref 8.9–10.3)
Chloride: 98 mmol/L — ABNORMAL LOW (ref 101–111)
GFR calc Af Amer: >60 mL/min (ref >60)
GFR, EST NON AFRICAN AMERICAN: 59 mL/min — AB (ref >60)
GLUCOSE: 130 mg/dL — AB (ref 65–99)
Potassium: 3.8 mmol/L (ref 3.5–5.1)
SODIUM: 133 mmol/L — AB (ref 135–145)

## 2016-11-27 LAB — I-STAT TROPONIN, ED: TROPONIN I, POC: 0.01 ng/mL (ref 0.00–0.08)

## 2016-11-27 LAB — PROTIME-INR
INR: 1.15
Prothrombin Time: 14.7 seconds (ref 11.4–15.2)

## 2016-11-27 LAB — INFLUENZA PANEL BY PCR (TYPE A & B)
Influenza A By PCR: NEGATIVE
Influenza B By PCR: NEGATIVE

## 2016-11-27 LAB — BRAIN NATRIURETIC PEPTIDE: B NATRIURETIC PEPTIDE 5: 443.6 pg/mL — AB (ref 0.0–100.0)

## 2016-11-27 LAB — TROPONIN I: Troponin I: <0.03 ng/mL (ref <0.03)

## 2016-11-27 MED ORDER — DEXTROSE 5 % IV SOLN
1.0000 g | INTRAVENOUS | Status: DC
  Administered 2016-11-27 – 2016-12-05 (×9): 1 g via INTRAVENOUS
  Filled 2016-11-27 (×11): qty 1

## 2016-11-27 MED ORDER — DILTIAZEM HCL 100 MG IV SOLR
5.0000 mg/h | INTRAVENOUS | Status: AC
  Administered 2016-11-27: 10 mg/h via INTRAVENOUS
  Administered 2016-11-27: 5 mg/h via INTRAVENOUS
  Administered 2016-11-28 – 2016-11-29 (×4): 10 mg/h via INTRAVENOUS
  Filled 2016-11-27 (×6): qty 100

## 2016-11-27 MED ORDER — APIXABAN 5 MG PO TABS
5.0000 mg | ORAL_TABLET | Freq: Two times a day (BID) | ORAL | Status: DC
  Administered 2016-11-28 – 2016-12-10 (×25): 5 mg via ORAL
  Filled 2016-11-27 (×26): qty 1

## 2016-11-27 MED ORDER — DILTIAZEM LOAD VIA INFUSION
10.0000 mg | Freq: Once | INTRAVENOUS | Status: AC
  Administered 2016-11-27: 10 mg via INTRAVENOUS
  Filled 2016-11-27: qty 10

## 2016-11-27 MED ORDER — VANCOMYCIN HCL IN DEXTROSE 750-5 MG/150ML-% IV SOLN
750.0000 mg | INTRAVENOUS | Status: DC
  Administered 2016-11-27 – 2016-11-28 (×2): 750 mg via INTRAVENOUS
  Filled 2016-11-27 (×2): qty 150

## 2016-11-27 NOTE — ED Notes (Signed)
Contacted pharmacy to inform them that we need cartizem drip stat....Marland KitchenMarland Kitchen

## 2016-11-27 NOTE — ED Provider Notes (Signed)
[facility] DEPT Provider Note   CSN: [id number] Arrival date & time: 11/27/16  1107     History   Chief Complaint Chief Complaint  Patient presents with  . shortness of breath/ atrial fib    HPI [name] is a 81 y.o. female.  Patient with past medical history remarkable for hypertension, hyperlipidemia, thoracic aortic aneurysm, presents to the emergency department with chief complaint of shortness of breath 3 weeks. She states that she was seen by her primary care doctor this morning, and was referred to the emergency department for A. fib with RVR. She denies having any chest pain. She denies any fevers, chills, or cough. He states that she has felt increasingly fatigued over the past several days. There are no other associated symptoms. She has not taken anything for her symptoms.   The history is provided by the patient. No language interpreter was used.    Past Medical History:  Diagnosis Date  . Aneurysm ([place])   . Anxiety   . Breast cancer ([place]) 08/20/2011   R breast DCIS, ER/PR +  . Cancer ([place]) 10/12   bx/right breast  . Cataract 3 and 10/92   bilateral  . DVT (deep venous thrombosis) ([place]) 08/2011   LL extremity   . Fibromyalgia   . Fracture lumbar vertebra-closed ([place])   . Fracture of thoracic vertebra, closed ([place])   . GERD (gastroesophageal reflux disease)   . H/O hiatal hernia   . History of blood clots   . History of radiation therapy 01/2012   R breast  . Hypercholesteremia   . Hypertension    DR Orinda Kenner  . Hypothyroidism   . Osteoporosis   . Osteoporosis   . Rib fractures   . Shortness of breath   . Thyroid disease     Patient Active Problem List   Diagnosis Date Noted  . Herniated cervical intervertebral disc 09/09/2015  . Cervical radiculopathy   . CAP (community acquired pneumonia) 09/04/2015  . Upper back pain on left side 09/04/2015  . Community acquired pneumonia 09/04/2015  . Cough   . Left-sided thoracic back pain     . Neoplasm of right breast, primary tumor staging category Tis: ductal carcinoma in situ (DCIS) 09/03/2013  . Dyspnea 01/21/2013  . Bilateral leg edema 01/21/2013  . History of DVT of lower extremity 01/21/2013  . Back pain, lumbosacral 01/21/2013  . Osteoporosis, unspecified 01/21/2013  . Essential hypertension, benign 01/21/2013  . Hypothyroidism 01/21/2013  . Rib fractures   . Fracture lumbar vertebra-closed ([place])   . Fracture of thoracic vertebra, closed ([employer])   . History of blood clots   . Cataract   . Aortic aneurysm, thoracic ([place]) 09/07/2011  . DVT (deep venous thrombosis) ([place]) 09/07/2011    Past Surgical History:  Procedure Laterality Date  . ANTERIOR CERVICAL DECOMP/DISCECTOMY FUSION N/A 09/09/2015   Procedure: Anterior Cervical Decompression and Fusion Cervical seven-Thorasic one ;  Surgeon: Erline Levine, MD;  Location: [facility];  Service: Neurosurgery;  Laterality: N/A;  . APPENDECTOMY  1940  . BREAST SURGERY    . CATARACT EXTRACTION  1992  . EYE SURGERY  1940, 1956  . HERNIA REPAIR  10/16/2006   [facility] - Dr Hassell Done  . KYPHOPLASTY N/A 01/26/2013   Procedure: KYPHOPLASTY;  Surgeon: Kristeen Miss, MD;  Location: [facility];  Service: Neurosurgery;  Laterality: N/A;  T11 and L1  . MASTECTOMY, PARTIAL  10/17/2011   Procedure: MASTECTOMY PARTIAL;  Surgeon: Haywood Lasso, MD;  Location: [facility];  Service: General;  Laterality: Right;  needle guided  . TONSILLECTOMY  1944    OB History    No data available       Home Medications    Prior to Admission medications   Medication Sig Start Date End Date Taking? Authorizing Provider  amitriptyline (ELAVIL) 50 MG tablet Take 50 mg by mouth at bedtime.    Historical Provider, MD  amLODipine (NORVASC) 5 MG tablet Take 1 tablet (5 mg total) by mouth daily. 01/28/13   [name], MD  aspirin 81 MG tablet Take 81 mg by mouth daily.    Historical Provider, MD  BONIVA 150 MG tablet Take 150 mg by mouth every 30 (thirty)  days.  08/30/11   Historical Provider, MD  calcium citrate-vitamin D (CITRACAL+D) 315-200 MG-UNIT per tablet Take 1 tablet by mouth 2 (two) times daily. 01/28/13   [name], MD  cholecalciferol (VITAMIN D) 1000 UNITS tablet Take 1,000 Units by mouth daily. Vitamin D3    Historical Provider, MD  HYDROcodone-acetaminophen (NORCO/VICODIN) 5-325 MG tablet Take 1-2 tablets by mouth every 4 (four) hours as needed (mild pain). 09/12/15   Reyne Dumas, MD  levothyroxine (SYNTHROID, LEVOTHROID) 112 MCG tablet Take 112 mcg by mouth daily.    Historical Provider, MD  metoprolol succinate (TOPROL-XL) 25 MG 24 hr tablet TAKE 1 TABLET EACH DAY. 01/18/14   Ivin Poot, MD  Multiple Vitamin (MULTIVITAMIN) tablet Take 1 tablet by mouth daily.     Historical Provider, MD  pravastatin (PRAVACHOL) 20 MG tablet Take 20 mg by mouth every evening.  09/02/11   Historical Provider, MD  PRILOSEC OTC 20 MG tablet Take 20 mg by mouth daily.  07/01/11   Historical Provider, MD  solifenacin (VESICARE) 5 MG tablet Take 5 mg by mouth daily.    Historical Provider, MD    Family History Family History  Problem Relation Age of Onset  . Heart disease Mother   . Heart disease Maternal Uncle   . Heart disease Maternal Grandfather     Social History Social History  Substance Use Topics  . Smoking status: Never Smoker  . Smokeless tobacco: Never Used  . Alcohol use No     Allergies   Contrast media [iodinated diagnostic agents]; Iohexol; and Penicillins   Review of Systems Review of Systems  Respiratory: Positive for shortness of breath.   All other systems reviewed and are negative.    Physical Exam Updated Vital Signs BP 107/76 (BP Location: Left Arm)   Pulse 104   Temp 98 F (36.7 C) (Oral)   Resp 18   Ht 4\' 4"  (1.321 m)   Wt 51.7 kg   SpO2 98%   BMI 29.64 kg/m   Physical Exam  Constitutional: She is oriented to person, place, and time. She appears well-developed and well-nourished.  HENT:    Head: Normocephalic and atraumatic.  Eyes: Conjunctivae and EOM are normal. Pupils are equal, round, and reactive to light.  Neck: Normal range of motion. Neck supple.  Cardiovascular:  Irregularly irregular  Pulmonary/Chest: Effort normal and breath sounds normal. No respiratory distress. She has no wheezes. She has no rales. She exhibits no tenderness.  Abdominal: Soft. Bowel sounds are normal. She exhibits no distension and no mass. There is no tenderness. There is no rebound and no guarding.  Musculoskeletal: Normal range of motion. She exhibits no edema or tenderness.  Neurological: She is alert and oriented to person, place, and time.  Skin: Skin  is warm and dry.  Psychiatric: She has a normal mood and affect. Her behavior is normal. Judgment and thought content normal.  Nursing note and vitals reviewed.    ED Treatments / Results  Labs (all labs ordered are listed, but only abnormal results are displayed) Labs Reviewed  BASIC METABOLIC PANEL - Abnormal; Notable for the following:       Result Value   Sodium 133 (*)    Chloride 98 (*)    Glucose, Bld 130 (*)    BUN 29 (*)    GFR calc non Af Amer 59 (*)    All other components within normal limits  CBC - Abnormal; Notable for the following:    WBC 14.2 (*)    All other components within normal limits  PROTIME-INR  I-STAT TROPOININ, ED    EKG  EKG Interpretation  Date/Time:  Tuesday November 27 2016 11:17:57 EST Ventricular Rate:  161 PR Interval:    QRS Duration: 78 QT Interval:  294 QTC Calculation: 481 R Axis:   -156 Text Interpretation:  atrial fibrillation Right superior axis deviation Anteroseptal infarct , age undetermined Abnormal ECG Since previous tracing atrial fibrillation with RVR is new Confirmed by Canary Brim  MD, MARTHA [phone]) on 11/27/2016 11:40:00 AM       Radiology Dg Chest Port 1 View  Result Date: 11/27/2016 CLINICAL DATA:  Shortness of breath and cough for 3 weeks. EXAM: PORTABLE CHEST 1 VIEW  COMPARISON:  Single-view of the chest 09/10/2015. FINDINGS: Airspace disease is seen in the right upper and lower lung zones and left lung base. There is cardiomegaly. Aortic atherosclerosis is noted. No pneumothorax. There appear to be small bilateral pleural effusions. IMPRESSION: Right worse than left airspace disease most worrisome for pneumonia. Small bilateral pleural effusions. Cardiomegaly. Atherosclerosis. Electronically Signed   By: Inge Rise M.D.   On: 11/27/2016 12:06    Procedures Procedures (including critical care time)  Medications Ordered in ED Medications  diltiazem (CARDIZEM) 1 mg/mL load via infusion 10 mg (10 mg Intravenous Bolus from Bag 11/27/16 1212)    And  diltiazem (CARDIZEM) 100 mg in dextrose 5 % 100 mL (1 mg/mL) infusion (5 mg/hr Intravenous Bolus from Bag 11/27/16 1213)     Initial Impression / Assessment and Plan / ED Course  I have reviewed the triage vital signs and the nursing notes.  Pertinent labs & imaging results that were available during my care of the patient were reviewed by me and considered in my medical decision making (see chart for details).  Clinical Course     Patient with 3 weeks of shortness of breath. EKG shows new onset A. fib with RVR. Unclear timeline for when this specifically started. Will give Cardizem, and plan for cardiology consultation.  Patient discussed with Dr. Canary Brim, who recommends starting with 10mg  of Cardizem and titrated infusion.  Patient discussed with Dr. Canary Brim, who recommends cardiology consult for anticipated admission.   CRITICAL CARE Performed by: Montine Circle   Total critical care time: 45 minutes  Critical care time was exclusive of separately billable procedures and treating other patients.  Critical care was necessary to treat or prevent imminent or life-threatening deterioration.  Critical care was time spent personally by me on the following activities: development of treatment plan  with patient and/or surrogate as well as nursing, discussions with consultants, evaluation of patient's response to treatment, examination of patient, obtaining history from patient or surrogate, ordering and performing treatments and interventions, ordering and review of laboratory  studies, ordering and review of radiographic studies, pulse oximetry and re-evaluation of patient's condition.  Final Clinical Impressions(s) / ED Diagnoses   Final diagnoses:  Atrial fibrillation with RVR [facility])    New Prescriptions New Prescriptions   No medications on file     Montine Circle, PA-C 11/27/16 [place], MD 11/27/16 1425

## 2016-11-27 NOTE — ED Notes (Signed)
Cardiology at bedside.

## 2016-11-27 NOTE — ED Provider Notes (Signed)
Assumed care from Dr. Canary Brim. Briefly, patient is a 81 year old here with cough, sputum production, shortness of breath. She is found to be in A. fib RVR with pneumonia on chest x-ray. Cardiology was consulted and feels patient will need medicine admission for pneumonia and they will make consultation regarding her atrial fibrillation. Heart rate is improving on diltiazem drip. Will admit to hospitalists. HCAP coverage provided as patient was recently hospitalized in October.     Duffy Bruce, MD 11/27/16 [phone]

## 2016-11-27 NOTE — ED Notes (Signed)
Admitting provider bedside 

## 2016-11-27 NOTE — H&P (Signed)
[facility] History and Physical  [name] [id number] DOB: 01-08-1936 DOA: 11/27/2016  Referring physician: Dr Ellender Hose, [facility] ED PCP: [name], MD   Chief Complaint: SOB  HPI: [name] is a 81 y.o. 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  In ED patietn was in afib with RVR, CXR showed pulm infiltrates read as PNA.  CARdiology consulted, started IV dilt with improving VR.  O2 . Pt feeling a little bit better. Asked to see for admission.    Patient reports 3 wks of cough, yellow green sputum, no fever/ chills.  Fatigues and +DOE.  Went to PCP who dx'd afib and sent to ED.  Main c/o has been SOB and tired.  No chest pain, no abd pain, no n/v/d, no joint pain or skin rash.  No palpitations.    Pt grew up in [place], worked for the [employer] and [employer] then for [employer] in [place].  Retired around 2001.  No children.  Widowed, her husband died last 2023-10-12 while in a SNF recovering from "neuropathy".  Pt lives alone now in [place].  Has two sisters, one in [place], , the other in [place]. No children.  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  Full code.  Hx of cervical disc fusion, appy, tonsillectomy, cataract surg.  ROS  no dysuria  no difficulty voidin  no change in urine color    Past Medical History  Past Medical History:  Diagnosis Date  . Aneurysm ([place])   . Anxiety   . Breast cancer ([place]) 08/20/2011   R breast DCIS, ER/PR +  . Cancer ([place]) 10/12   bx/right breast  . Cataract 3 and 12-Oct-1991   bilateral  . DVT (deep venous thrombosis) ([place]) 12-Oct-2011   LL extremity   . Fibromyalgia   . Fracture lumbar vertebra-closed ([place])   . Fracture of thoracic vertebra, closed ([place])   . GERD (gastroesophageal reflux disease)   . H/O hiatal hernia   . History of blood clots   .  History of radiation therapy 01/2012   R breast  . Hypercholesteremia   . Hypertension    DR Orinda Kenner  . Hypothyroidism   . Osteoporosis   . Osteoporosis   . Rib fractures   . Shortness of breath   . Thyroid disease    Past Surgical History  Past Surgical History:  Procedure Laterality Date  . ANTERIOR CERVICAL DECOMP/DISCECTOMY FUSION N/A 09/09/2015   Procedure: Anterior Cervical Decompression and Fusion Cervical seven-Thorasic one ;  Surgeon: Erline Levine, MD;  Location: [place] [facility];  Service: Neurosurgery;  Laterality: N/A;  . APPENDECTOMY  1940  . BREAST SURGERY    . CATARACT EXTRACTION  1992  . EYE SURGERY  1940, 1956  . HERNIA REPAIR  10/16/2006   [facility] - Dr Hassell Done  . KYPHOPLASTY N/A 01/26/2013   Procedure: KYPHOPLASTY;  Surgeon: Kristeen Miss, MD;  Location: [place] [facility];  Service: Neurosurgery;  Laterality: N/A;  T11 and L1  . MASTECTOMY, PARTIAL  10/17/2011   Procedure: MASTECTOMY PARTIAL;  Surgeon: Haywood Lasso, MD;  Location: [place];  Service: General;  Laterality: Right;  needle guided  . TONSILLECTOMY  1944   Family History  Family History  Problem Relation Age of Onset  . Heart disease Mother   .  Heart disease Maternal Uncle   . Heart disease Maternal Grandfather    Social History  reports that she has never smoked. She has never used smokeless tobacco. She reports that she does not drink alcohol or use drugs. Allergies  Allergies  Allergen Reactions  . Contrast Media [Iodinated Diagnostic Agents] Shortness Of Breath  . Iohexol Shortness Of Breath  . Penicillins Rash    Has patient had a PCN reaction causing immediate rash, facial/tongue/throat swelling, SOB or lightheadedness with hypotension: No Has patient had a PCN reaction causing severe rash involving mucus membranes or skin necrosis: No Has patient had a PCN reaction that required hospitalization No Has patient had a PCN reaction occurring within the last 10 years: No If all of the above answers  are "NO", then may proceed with Cephalosporin use.    Home medications Prior to Admission medications   Medication Sig Start Date End Date Taking? Authorizing Provider  amLODipine (NORVASC) 5 MG tablet Take 1 tablet (5 mg total) by mouth daily. 01/28/13  Yes [name], MD  aspirin 81 MG tablet Take 81 mg by mouth daily.   Yes Historical Provider, MD  Aspirin-Caffeine (BC FAST PAIN RELIEF PO) Take 1 packet by mouth daily as needed.   Yes Historical Provider, MD  BONIVA 150 MG tablet Take 150 mg by mouth every 30 (thirty) days.  08/30/11  Yes Historical Provider, MD  calcium citrate-vitamin D (CITRACAL+D) 315-200 MG-UNIT per tablet Take 1 tablet by mouth 2 (two) times daily. 01/28/13  Yes [name], MD  cholecalciferol (VITAMIN D) 1000 UNITS tablet Take 1,000 Units by mouth daily. Vitamin D3   Yes Historical Provider, MD  HYDROcodone-acetaminophen (NORCO/VICODIN) 5-325 MG tablet Take 1-2 tablets by mouth every 4 (four) hours as needed (mild pain). 09/12/15  Yes Reyne Dumas, MD  levothyroxine (SYNTHROID, LEVOTHROID) 112 MCG tablet Take 112 mcg by mouth daily.   Yes Historical Provider, MD  metoprolol succinate (TOPROL-XL) 25 MG 24 hr tablet TAKE 1 TABLET EACH DAY. 01/18/14  Yes Ivin Poot, MD  Multiple Vitamin (MULTIVITAMIN) tablet Take 1 tablet by mouth daily.    Yes Historical Provider, MD  pravastatin (PRAVACHOL) 20 MG tablet Take 20 mg by mouth every evening.  09/02/11  Yes Historical Provider, MD  PRILOSEC OTC 20 MG tablet Take 20 mg by mouth daily.  07/01/11  Yes Historical Provider, MD  solifenacin (VESICARE) 5 MG tablet Take 5 mg by mouth daily.   Yes Historical Provider, MD  amitriptyline (ELAVIL) 50 MG tablet Take 50 mg by mouth at bedtime.    Historical Provider, MD   Liver Function Tests No results for input(s): AST, ALT, ALKPHOS, BILITOT, PROT, ALBUMIN in the last 168 hours. No results for input(s): LIPASE, AMYLASE in the last 168 hours. CBC  Recent Labs Lab 11/27/16 1115   WBC 14.2*  HGB 12.9  HCT 37.6  MCV 84.7  PLT 0000000   Basic Metabolic Panel  Recent Labs Lab 11/27/16 1115  NA 133*  K 3.8  CL 98*  CO2 22  GLUCOSE 130*  BUN 29*  CREATININE 0.91  CALCIUM 9.1     Vitals:   11/27/16 1945 11/27/16 2000 11/27/16 2015 11/27/16 2045  BP: 107/73 106/80 104/87 105/85  Pulse: (!) 28 116 79 (!) 124  Resp: 20 23 21 20   Temp:      TempSrc:      SpO2: 93% 96% 94% 94%  Weight:      Height:  Exam: Gen thin pleasant WF no distress, elderly , slightly frail No rash, cyanosis or gangrene Sclera anicteric, throat clear  +JVD Chest bibasilar coarse rales Cor irreg irreg no mrg Abd soft ntnd no mass or ascites +bs GU defer MS no joint effusions or deformity Ext 1+ pretib edema / no wounds or ulcers Neuro is alert, Ox 3 , nf  Na 133 K 3.8  BUN 29  Cr 0.91  Trop < 0.03 WBC 14k  Hb 12.9  Plt 246   EKG (independ reviewed) > afib w RVR, old anteroseptal infarct CXR (independ reviewed) > airspace disease R upper and lower lung fields and L lung base, "most worrisome for PNA" ECHO - no old echo on file    Assessment: 1.  Dyspnea - ^WBC and new afib with RVR, prod cough. Also LE edema and CXR that could be CHF or PNA.  Admit, IV abx and IV lasix x 2. Check BNP.  Afib management per cardiology who has seen pt already.   2.  Afib with RVR - new, started IV dilt and eliquis. Hold home norvasc/ toprol per cards while on Dilt IV.  3.  HTN 4.  Hx breast Ca 2012 5.  Hx DVT 2012 6.  Hypothyroidism  Plan - as above     Royersford D [facility] Pager [phone]   If 7PM-7AM, please contact night-coverage [email or web address] Password [email or web address] 11/27/2016, 9:07 PM

## 2016-11-27 NOTE — ED Notes (Signed)
Pt is asking nurse for night meds.

## 2016-11-27 NOTE — Consult Note (Signed)
Cardiology Consult    Patient ID: ORIAH HOLLENBACH MRN: [id number], DOB/AGE: [birth year]   Admit date: 11/27/2016 Date of Consult: 11/27/2016  Primary Physician: [name], MD Reason for Consult: New onset atrial fibrillation Primary Cardiologist: Dr.  Kinnie Feil Provider: Dr. Canary Brim   History of Present Illness    Garen Lah Dalto is a 81 year old female with past medical history significant for hypertension, hyperlipidemia, stable 4.3 cm fusiform ascending thoracic aneurysm followed by Dr Darcey Nora, GERD, breast cancer(lumpectomy and radiation 2012), DVT 2014, hypothyroidism and fibromyalgia who has a 3 week history of shortness of breath. She was seen at her PCP this morning and found to be in atrial fibrillation and referred to the [facility] ED. Cardiology has been asked to consult.  The patient denies any chest pain. She has had shortness of breath with exertion, fatigue and feeling like she just can't go for the last 3 weeks. She has had no dizziness, near syncope, palpitaitons or orthopnea. She has had an occasional non-productive cough, no fever or chills.  Her first troponin is 0.01. EKG shows atrial fibrillation with rapid ventricular response rate of 161 with right axis deviation. Chest xray shows right worse than left airspace disease most worrisome for pneumonia, small bilateral pleural effusions, cardiomegaly, atherosclerosis. Her WBCs are 14.2k  She has a previous admission for chest pain in 2012 which was found to be non cardiac. A nuclear stress test at that time shoed low risk with no evidence of ischemia and normal EF of 63%.  A CTA of the chest in 03/2015 showed mild atherosclerotic CAD, mild cardiomegaly (done by Dr Prescott Gum to assess aortic aneursym)  I do not find any echocardiogram results.  Past Medical History   Past Medical History:  Diagnosis Date  . Aneurysm ([place])   . Anxiety   . Breast cancer ([place]) 08/20/2011   R breast DCIS, ER/PR +  .  Cancer ([place]) 10/12   bx/right breast  . Cataract 3 and 10/92   bilateral  . DVT (deep venous thrombosis) ([place]) 08/2011   LL extremity   . Fibromyalgia   . Fracture lumbar vertebra-closed ([place])   . Fracture of thoracic vertebra, closed ([place])   . GERD (gastroesophageal reflux disease)   . H/O hiatal hernia   . History of blood clots   . History of radiation therapy 01/2012   R breast  . Hypercholesteremia   . Hypertension    DR Orinda Kenner  . Hypothyroidism   . Osteoporosis   . Osteoporosis   . Rib fractures   . Shortness of breath   . Thyroid disease     Past Surgical History:  Procedure Laterality Date  . ANTERIOR CERVICAL DECOMP/DISCECTOMY FUSION N/A 09/09/2015   Procedure: Anterior Cervical Decompression and Fusion Cervical seven-Thorasic one ;  Surgeon: Erline Levine, MD;  Location: [place] [facility]UR[facility]S;  Service: Neurosurgery;  Laterality: N/A;  . APPENDECTOMY  1940  . BREAST SURGERY    . CATARACT EXTRACTION  1992  . EYE SURGERY  1940, 1956  . HERNIA REPAIR  10/16/2006   [facility] - Dr Hassell Done  . KYPHOPLASTY N/A 01/26/2013   Procedure: KYPHOPLASTY;  Surgeon: Kristeen Miss, MD;  Location: [place] [facility]S;  Service: Neurosurgery;  Laterality: N/A;  T11 and L1  . MASTECTOMY, PARTIAL  10/17/2011   Procedure: MASTECTOMY PARTIAL;  Surgeon: Haywood Lasso, MD;  Location: [place];  Service: General;  Laterality: Right;  needle guided  . TONSILLECTOMY  1944  Allergies  Allergies  Allergen Reactions  . Contrast Media [Iodinated Diagnostic Agents] Shortness Of Breath  . Iohexol Shortness Of Breath  . Penicillins Rash    Has patient had a PCN reaction causing immediate rash, facial/tongue/throat swelling, SOB or lightheadedness with hypotension: No Has patient had a PCN reaction causing severe rash involving mucus membranes or skin necrosis: No Has patient had a PCN reaction that required hospitalization No Has patient had a PCN reaction occurring within the last 10 years: No If all of the  above answers are "NO", then may proceed with Cephalosporin use.     Inpatient Medications      Family History    Family History  Problem Relation Age of Onset  . Heart disease Mother   . Heart disease Maternal Uncle   . Heart disease Maternal Grandfather     Social History    Social History   Social History  . Marital status: Married    Spouse name: N/A  . Number of children: N/A  . Years of education: N/A   Occupational History  . Not on file.   Social History Main Topics  . Smoking status: Never Smoker  . Smokeless tobacco: Never Used  . Alcohol use No  . Drug use: No  . Sexual activity: No   Other Topics Concern  . Not on file   Social History Narrative  . No narrative on file    . apixaban  5 mg Oral BID  . vancomycin  750 mg Intravenous Q24H   . ceFEPime (MAXIPIME) IV    . diltiazem (CARDIZEM) infusion 10 mg/hr (11/27/16 1515)    Review of Systems    General:  No chills, fever, night sweats or weight changes. Positive for fatigue Cardiovascular:  No chest pain,orthopnea, palpitations, paroxysmal nocturnal dyspnea. Positive for DOE Dermatological: No rash, lesions/masses Respiratory: No cough, dyspnea Urologic: No hematuria, dysuria Abdominal:   No nausea, vomiting, diarrhea, bright red blood per rectum, melena, or hematemesis Neurologic:  No visual changes, changes in mental status. All other systems reviewed and are otherwise negative except as noted above.  Physical Exam    Blood pressure 119/84, pulse (!) 128, temperature 98 F (36.7 C), temperature source Oral, resp. rate 23, height 4\' 4"  (1.321 m), weight 114 lb (51.7 kg), SpO2 95 %.  General: Pleasant caucasian female, NAD Psych: Normal affect. Neuro: Alert and oriented X 3. Moves all extremities spontaneously. HEENT: Normal  Neck: Supple without bruits or JVD. Lungs:  Resp regular and unlabored, CTA except for few left basilar crackles Heart: Irregularly irregular rhythm no s3, s4,  or murmurs. Abdomen: Soft, non-tender, non-distended, BS + x 4.  Extremities: No clubbing or cyanosis. Trace pretibial edema. TED hose in place. DP/PT/Radials 2+ and equal bilaterally.  Labs    Troponin [facility] of Care Test)  Recent Labs  11/27/16 1132  TROPIPOC 0.01   No results for input(s): CKTOTAL, CKMB, TROPONINI in the last 72 hours. Lab Results  Component Value Date   WBC 14.2 (H) 11/27/2016   HGB 12.9 11/27/2016   HCT 37.6 11/27/2016   MCV 84.7 11/27/2016   PLT 246 11/27/2016     Recent Labs Lab 11/27/16 1115  NA 133*  K 3.8  CL 98*  CO2 22  BUN 29*  CREATININE 0.91  CALCIUM 9.1  GLUCOSE 130*   Lab Results  Component Value Date   CHOL  12/02/2010    149        ATP III CLASSIFICATION:  <  200     mg/dL   Desirable  200-239  mg/dL   Borderline High  >=240    mg/dL   High          HDL 58 12/02/2010   LDLCALC  12/02/2010    80        Total Cholesterol/HDL:CHD Risk Coronary Heart Disease Risk Table                     Men   Women  1/2 Average Risk   3.4   3.3  Average Risk       5.0   4.4  2 X Average Risk   9.6   7.1  3 X Average Risk  23.4   11.0        Use the calculated Patient Ratio above and the CHD Risk Table to determine the patient's CHD Risk.        ATP III CLASSIFICATION (LDL):  <100     mg/dL   Optimal  100-129  mg/dL   Near or Above                    Optimal  130-159  mg/dL   Borderline  160-189  mg/dL   High  >190     mg/dL   Very High   TRIG 54 12/02/2010      Radiology Studies    Dg Chest Port 1 View  Result Date: 11/27/2016 CLINICAL DATA:  Shortness of breath and cough for 3 weeks. EXAM: PORTABLE CHEST 1 VIEW COMPARISON:  Single-view of the chest 09/10/2015. FINDINGS: Airspace disease is seen in the right upper and lower lung zones and left lung base. There is cardiomegaly. Aortic atherosclerosis is noted. No pneumothorax. There appear to be small bilateral pleural effusions. IMPRESSION: Right worse than left airspace disease  most worrisome for pneumonia. Small bilateral pleural effusions. Cardiomegaly. Atherosclerosis. Electronically Signed   By: Inge Rise M.D.   On: 11/27/2016 12:06    EKG & Cardiac Imaging    EKG:  atrial fibrillation with rapid ventricular response rate of 161 with right axis deviation.   Echocardiogram:   Assessment & Plan    1. Atrial fibrillation -Pt complaints of shortness of breath X 3 weeks, no chest pain -EKG shows atrial fibrillation with rapid ventricular response rate of 161 with right axis deviation.  -First troponin is 0.01, check one more -Has no history of atrial fibrillation or ischemic events, but does have coronary calcification evident on CTA of the chest and chest Xray. Had normal nuclear stress test in 2012 with normal LVEF. -This patients CHA2DS2-VASc Score is at least 5  (HTN, Age (2), vascular disease, female). Will need anticoagulation with DOAC. (has been on coumadin in the past for DVT with no bleeding complications) -Pt is stable and not in need of urgent cardioversion. Would need to be anticoagulated for 3 weeks prior to considering planned cardioversion or needs 3 days of steady state on DOAC prior to TEE and cardioversion -Has been started on diltiazem drip for rate control. Will attempt rate control and initiate anticoagulation. Since this is new for her she may cardiovert on her own.  -Check echocardiogram to assess LV function and wall motion -Check TSH, creatinine is 0.91, K+ is 3.8  2. Possible pneumonia -Chest xray shows right worse than left airspace disease most worrisome for pneumonia, small bilateral pleural effusions, cardiomegaly, atherosclerosis. -No fever, chills. -Leukocytosis with WBCs- 14.2 -Will defer to internal med  3. Hypertension -DC home amlodipine and metoprolol while on cardizem  4. Dyslipidemia -Last lipid panel on file in EPIC was 2012. LDL was 80 -managed by PCP -Continue pravastatin  Signed, Daune Perch,  NP-C 11/27/2016, 5:30 PM Pager: [phone]  The patient was seen, examined and discussed with Daune Perch, NP-C and agree as above.  Navah SWATI RINIKER is a 81 year old female with past medical history significant for hypertension, hyperlipidemia, stable 4.3 cm fusiform ascending thoracic aneurysm followed by Dr Darcey Nora, GERD, breast cancer(lumpectomy and radiation 2012), DVT 2014, hypothyroidism and fibromyalgia who has a 3 week history of shortness of breath. She was seen at her PCP this morning and found to be in atrial fibrillation and referred to the [facility] ED. Cardiology has been asked to consult. The patient has been having colds, chills, productive cough x 3 weeks, CXR is suggestive of pneumonia. WBC is 14.000. She was started on cefepime/vancomycine.  There is no echocardiogram in the system, we will order.  Increase diltiazem dose to 15 mg/hr, hold amlodipine. We will start Eliquis 5 mg po BID.   Ena Dawley, MD 11/27/2016

## 2016-11-27 NOTE — Progress Notes (Signed)
Pharmacy Antibiotic Note  MARHTA LOURY is a 81 y.o. female admitted on 11/27/2016 with pneumonia.  Pharmacy has been consulted for cefepime and vancomycin dosing.  Plan: Vancomycin 750mg  IV every 24 hours.  Goal trough 15-20 mcg/mL.  Cefepime 1g IV q24h Monitor culture data, renal function and clinical course VT at SS prn  Height: 4\' 4"  (132.1 cm) Weight: 114 lb (51.7 kg) IBW/kg (Calculated) : 27.1  Temp (24hrs), Avg:98 F (36.7 C), Min:98 F (36.7 C), Max:98 F (36.7 C)   Recent Labs Lab 11/27/16 1115  WBC 14.2*  CREATININE 0.91    Estimated Creatinine Clearance: 29.7 mL/min (by C-G formula based on SCr of 0.91 mg/dL).    Allergies  Allergen Reactions  . Contrast Media [Iodinated Diagnostic Agents] Shortness Of Breath  . Iohexol Shortness Of Breath  . Penicillins Rash    Has patient had a PCN reaction causing immediate rash, facial/tongue/throat swelling, SOB or lightheadedness with hypotension: No Has patient had a PCN reaction causing severe rash involving mucus membranes or skin necrosis: No Has patient had a PCN reaction that required hospitalization No Has patient had a PCN reaction occurring within the last 10 years: No If all of the above answers are "NO", then may proceed with Cephalosporin use.      Andrey Cota. Diona Foley, PharmD, [place] [facility] Pharmacist Pager [phone] 11/27/2016 6:05 PM

## 2016-11-27 NOTE — ED Triage Notes (Signed)
Patient here with increasing shortness of breath x 3 weeks. Was sent by MD for atrial fib. Patient denies CP. Shortness of breath with exertion. Patient denies hx of fib

## 2016-11-28 ENCOUNTER — Inpatient Hospital Stay (HOSPITAL_COMMUNITY): Payer: Medicare Other

## 2016-11-28 DIAGNOSIS — I4891 Unspecified atrial fibrillation: Secondary | ICD-10-CM

## 2016-11-28 DIAGNOSIS — I10 Essential (primary) hypertension: Secondary | ICD-10-CM

## 2016-11-28 DIAGNOSIS — I5031 Acute diastolic (congestive) heart failure: Secondary | ICD-10-CM

## 2016-11-28 DIAGNOSIS — D72829 Elevated white blood cell count, unspecified: Secondary | ICD-10-CM | POA: Diagnosis present

## 2016-11-28 DIAGNOSIS — J181 Lobar pneumonia, unspecified organism: Secondary | ICD-10-CM | POA: Diagnosis present

## 2016-11-28 DIAGNOSIS — J189 Pneumonia, unspecified organism: Secondary | ICD-10-CM

## 2016-11-28 DIAGNOSIS — I272 Pulmonary hypertension, unspecified: Secondary | ICD-10-CM

## 2016-11-28 DIAGNOSIS — R600 Localized edema: Secondary | ICD-10-CM

## 2016-11-28 LAB — BASIC METABOLIC PANEL
Anion gap: 9 (ref 5–15)
BUN: 23 mg/dL — ABNORMAL HIGH (ref 6–20)
CALCIUM: 8.6 mg/dL — AB (ref 8.9–10.3)
CO2: 22 mmol/L (ref 22–32)
CREATININE: 0.69 mg/dL (ref 0.44–1.00)
Chloride: 102 mmol/L (ref 101–111)
GFR calc non Af Amer: >60 mL/min (ref >60)
Glucose, Bld: 131 mg/dL — ABNORMAL HIGH (ref 65–99)
Potassium: 3.2 mmol/L — ABNORMAL LOW (ref 3.5–5.1)
Sodium: 133 mmol/L — ABNORMAL LOW (ref 135–145)

## 2016-11-28 LAB — CBC
HEMATOCRIT: 34.9 % — AB (ref 36.0–46.0)
Hemoglobin: 11.9 g/dL — ABNORMAL LOW (ref 12.0–15.0)
MCH: 28.7 pg (ref 26.0–34.0)
MCHC: 34.1 g/dL (ref 30.0–36.0)
MCV: 84.1 fL (ref 78.0–100.0)
PLATELETS: 226 10*3/uL (ref 150–400)
RBC: 4.15 MIL/uL (ref 3.87–5.11)
RDW: 14.4 % (ref 11.5–15.5)
WBC: 9.2 10*3/uL (ref 4.0–10.5)

## 2016-11-28 LAB — ECHOCARDIOGRAM COMPLETE
HEIGHTINCHES: 59 in
Weight: 1833.6 oz

## 2016-11-28 LAB — MRSA PCR SCREENING: MRSA by PCR: NEGATIVE

## 2016-11-28 MED ORDER — ACETAMINOPHEN 325 MG PO TABS
650.0000 mg | ORAL_TABLET | ORAL | Status: DC | PRN
  Administered 2016-11-29: 650 mg via ORAL
  Filled 2016-11-28: qty 2

## 2016-11-28 MED ORDER — ZOLPIDEM TARTRATE 5 MG PO TABS
5.0000 mg | ORAL_TABLET | Freq: Every evening | ORAL | Status: DC | PRN
  Filled 2016-11-28: qty 1

## 2016-11-28 MED ORDER — PANTOPRAZOLE SODIUM 40 MG PO TBEC
40.0000 mg | DELAYED_RELEASE_TABLET | Freq: Every day | ORAL | Status: DC
  Administered 2016-11-28 – 2016-12-10 (×13): 40 mg via ORAL
  Filled 2016-11-28 (×13): qty 1

## 2016-11-28 MED ORDER — VITAMIN D 1000 UNITS PO TABS
1000.0000 [IU] | ORAL_TABLET | Freq: Every day | ORAL | Status: DC
  Administered 2016-11-28 – 2016-12-10 (×13): 1000 [IU] via ORAL
  Filled 2016-11-28 (×13): qty 1

## 2016-11-28 MED ORDER — ADULT MULTIVITAMIN W/MINERALS CH
1.0000 | ORAL_TABLET | Freq: Every day | ORAL | Status: DC
  Administered 2016-11-28 – 2016-12-06 (×8): 1 via ORAL
  Filled 2016-11-28 (×13): qty 1

## 2016-11-28 MED ORDER — LEVOTHYROXINE SODIUM 112 MCG PO TABS
112.0000 ug | ORAL_TABLET | Freq: Every day | ORAL | Status: DC
  Administered 2016-11-28 – 2016-12-10 (×12): 112 ug via ORAL
  Filled 2016-11-28 (×12): qty 1

## 2016-11-28 MED ORDER — HYDROCODONE-ACETAMINOPHEN 5-325 MG PO TABS
1.0000 | ORAL_TABLET | ORAL | Status: DC | PRN
  Administered 2016-11-28 – 2016-12-09 (×14): 1 via ORAL
  Filled 2016-11-28 (×4): qty 1
  Filled 2016-11-28: qty 2
  Filled 2016-11-28 (×9): qty 1
  Filled 2016-11-28 (×2): qty 2

## 2016-11-28 MED ORDER — ASPIRIN EC 81 MG PO TBEC
81.0000 mg | DELAYED_RELEASE_TABLET | Freq: Every day | ORAL | Status: DC
  Administered 2016-11-28 – 2016-12-01 (×4): 81 mg via ORAL
  Filled 2016-11-28 (×4): qty 1

## 2016-11-28 MED ORDER — ONDANSETRON HCL 4 MG/2ML IJ SOLN: 4.0000 mg | Freq: Four times a day (QID) | INTRAMUSCULAR | Status: DC | PRN

## 2016-11-28 MED ORDER — PRAVASTATIN SODIUM 20 MG PO TABS
20.0000 mg | ORAL_TABLET | Freq: Every evening | ORAL | Status: DC
  Administered 2016-11-28 – 2016-12-09 (×12): 20 mg via ORAL
  Filled 2016-11-28 (×13): qty 1

## 2016-11-28 MED ORDER — DARIFENACIN HYDROBROMIDE ER 7.5 MG PO TB24
7.5000 mg | ORAL_TABLET | Freq: Every day | ORAL | Status: DC
Start: 2016-11-28 — End: 2016-12-10
  Administered 2016-11-28 – 2016-12-10 (×13): 7.5 mg via ORAL
  Filled 2016-11-28 (×13): qty 1

## 2016-11-28 MED ORDER — POTASSIUM CHLORIDE CRYS ER 20 MEQ PO TBCR
40.0000 meq | EXTENDED_RELEASE_TABLET | Freq: Once | ORAL | Status: AC
  Administered 2016-11-28: 40 meq via ORAL
  Filled 2016-11-28: qty 2

## 2016-11-28 MED ORDER — FUROSEMIDE 10 MG/ML IJ SOLN
20.0000 mg | Freq: Two times a day (BID) | INTRAMUSCULAR | Status: DC
  Administered 2016-11-28 – 2016-11-29 (×4): 20 mg via INTRAVENOUS
  Filled 2016-11-28 (×4): qty 2

## 2016-11-28 MED ORDER — SODIUM CHLORIDE 0.9 % IV SOLN
250.0000 mL | INTRAVENOUS | Status: DC | PRN
  Administered 2016-11-28: 250 mL via INTRAVENOUS

## 2016-11-28 MED ORDER — SODIUM CHLORIDE 0.9% FLUSH
3.0000 mL | Freq: Two times a day (BID) | INTRAVENOUS | Status: DC
  Administered 2016-11-28 – 2016-12-10 (×19): 3 mL via INTRAVENOUS

## 2016-11-28 MED ORDER — AMITRIPTYLINE HCL 50 MG PO TABS
50.0000 mg | ORAL_TABLET | Freq: Every day | ORAL | Status: DC
  Administered 2016-11-30: 50 mg via ORAL
  Filled 2016-11-28 (×11): qty 1

## 2016-11-28 NOTE — Progress Notes (Addendum)
Patient Name: [name] Date of Encounter: 11/28/2016  Principal Problem:   Atrial fibrillation with rapid ventricular response (HCC) Active Problems:   Dyspnea   Bilateral leg edema   History of DVT of lower extremity   Essential hypertension, benign   CAP (community acquired pneumonia)   Community acquired pneumonia   Acute CHF ([place])   Length of Stay: 1  SUBJECTIVE  She feels better today, continues to have dry cough.  CURRENT MEDS . amitriptyline  50 mg Oral QHS  . apixaban  5 mg Oral BID  . aspirin EC  81 mg Oral Daily  . ceFEPime (MAXIPIME) IV  1 g Intravenous Q24H  . cholecalciferol  1,000 Units Oral Daily  . darifenacin  7.5 mg Oral Daily  . furosemide  20 mg Intravenous BID  . levothyroxine  112 mcg Oral QAC breakfast  . multivitamin with minerals  1 tablet Oral Daily  . pantoprazole  40 mg Oral Daily  . pravastatin  20 mg Oral QPM  . sodium chloride flush  3 mL Intravenous Q12H  . vancomycin  750 mg Intravenous Q24H    OBJECTIVE  Vitals:   11/28/16 0800 11/28/16 0830 11/28/16 0900 11/28/16 1000  BP:  112/82 99/74 99/77   Pulse: (!) 59 69 (!) 108 (!) 50  Resp: 15 18 18 17   Temp:      TempSrc:      SpO2: 95% 96% 95% 95%  Weight:      Height:        Intake/Output Summary (Last 24 hours) at 11/28/16 1054 Last data filed at 11/28/16 1000  Gross per 24 hour  Intake           800.04 ml  Output              900 ml  Net           -99.96 ml   Filed Weights   11/27/16 1115 11/27/16 2300 11/28/16 0500  Weight: 114 lb (51.7 kg) 114 lb 4.8 oz (51.8 kg) 114 lb 9.6 oz (52 kg)    PHYSICAL EXAM  General: Pleasant, NAD. Neuro: Alert and oriented X 3. Moves all extremities spontaneously. Psych: Normal affect. HEENT:  Normal  Neck: Supple without bruits or JVD. Lungs:  Resp regular and unlabored, crackles B/L, no wheezing Heart: iRRR no s3, s4, 4/8 holosystolic murmur. Abdomen: Soft, non-tender, non-distended, BS + x 4.  Extremities: No  clubbing, cyanosis or edema. DP/PT/Radials 2+ and equal bilaterally.  Accessory Clinical Findings  CBC  Recent Labs  11/27/16 1115 11/28/16 0236  WBC 14.2* 9.2  HGB 12.9 11.9*  HCT 37.6 34.9*  MCV 84.7 84.1  PLT 246 [id number]   Basic Metabolic Panel  Recent Labs  11/27/16 1115 11/28/16 0236  NA 133* 133*  K 3.8 3.2*  CL 98* 102  CO2 22 22  GLUCOSE 130* 131*  BUN 29* 23*  CREATININE 0.91 0.69  CALCIUM 9.1 8.6*   Liver Function Tests No results for input(s): AST, ALT, ALKPHOS, BILITOT, PROT, ALBUMIN in the last 72 hours. No results for input(s): LIPASE, AMYLASE in the last 72 hours. Cardiac Enzymes  Recent Labs  11/27/16 1824  TROPONINI <0.03    Recent Labs  11/27/16 1824  TSH 2.994    Radiology/Studies  Dg Chest Port 1 View  Result Date: 11/27/2016 CLINICAL DATA:  Shortness of breath and cough for 3 weeks. EXAM: PORTABLE CHEST 1 VIEW COMPARISON:  Single-view of the chest 09/10/2015. FINDINGS: Airspace  disease is seen in the right upper and lower lung zones and left lung base. There is cardiomegaly. Aortic atherosclerosis is noted. No pneumothorax. There appear to be small bilateral pleural effusions. IMPRESSION: Right worse than left airspace disease most worrisome for pneumonia. Small bilateral pleural effusions. Cardiomegaly. Atherosclerosis. Electronically Signed   By: Inge Rise M.D.   On: 11/27/2016 12:06    TELE: a-fib with ventricular rate 70-100    ASSESSMENT AND PLAN  New onset a-fib with RVR CAP Acute CHF  ATB per primary team. Continue low dose lasix 20 mf iv BID as she is hypotensive.  Continue Cardizem drip and Eliquis.  I have personally reviewed her echocardiogram, she has normal LVEF, with MVP and at least moderate MR with severely dilated left atrium and severe pulmonary hypertesion RVSP 68 mmHg.  Her chance of cardioverting is low, eventhough she wasn't in a-fib in October, we will focus on rate control.  We will replace  potassium.   Signed, Ena Dawley MD, [facility] 11/28/2016

## 2016-11-28 NOTE — Progress Notes (Signed)
Pt refused eliquis this am until she can speak with md.  Paged Dr. Charlies Silvers at this time.

## 2016-11-28 NOTE — Progress Notes (Addendum)
Patient ID: [name], female   DOB: 1936/05/04, 81 y.o.   MRN: [id number]  PROGRESS NOTE    TUYET HEYDE  [id number] DOB: 02-13-1936 DOA: 11/27/2016  PCP: [name], MD   Brief Narrative:  81 y.o. female with history of osteoporosis, dyslipidemia, hypertension, hypothyroidism, breast Ca (lumpx/ XRT 2012-3), history of DVT in 2012, anxiety. She presented to ED with worsening shortness of breath and fatigue for past 3 weeks prior to this admission. No reports of chest pan. On admission, she was in A fib with RVR. CXR showed pulmonary infiltrates, right ,ore than left concerning for pneumonia. Cardiology consulted for Afib with RVR. Pt started on IV Cardizem.    Assessment & Plan:  Acute respiratory failure with hypoxia / Lobar pneumonia, unspecified organism / Leukocytosis  - Continue current antibiotic regimen with vanco and cefepime - Blood cx pending as of this am - Influenza negative   Atrial fibrillation with RVR - CHADS vasc score 4 - Normal troponin level  - On AC with eliquis - Continue aspirin  - Rate controlled with Cardizem drip - Cardio following   Acute on chronic diastolic CHF - BNP on admission 443 - 2 D ECHO with preserved EF - Appreciate cardio input - Continue lasix 20 mg IV BID - Daily weight and strict intake and output   Hypokalemia - Due to lasix - Supplemented - Follow up BMP in am  GERD without esophagitis - Continue protonix   Anxiety  - Continue amitriptyline   Dyslipidemia - Continue pravachol  DVT prophylaxis: On apixaban  Code Status: full code  Family Communication: No family at the bedside this am  Disposition Plan: needs PT eval, not yet stable for discharge    Consultants:   Cardio PT  Procedures:   None   Antimicrobials:   Cefepime 11/27/16 -->  Vanco 11/27/16 -->   Subjective: No overnight events.   Objective: Vitals:   11/28/16 0900 11/28/16 1000 11/28/16 1100 11/28/16 1155  BP:  99/74 99/77 102/81 93/80  Pulse: (!) 108 (!) 50 64 (!) 51  Resp: 18 17 (!) 21 18  Temp:    98.3 F (36.8 C)  TempSrc:    Oral  SpO2: 95% 95% 95% 92%  Weight:      Height:        Intake/Output Summary (Last 24 hours) at 11/28/16 1244 Last data filed at 11/28/16 1100  Gross per 24 hour  Intake           810.04 ml  Output              900 ml  Net           -89.96 ml   Filed Weights   11/27/16 1115 11/27/16 2300 11/28/16 0500  Weight: 51.7 kg (114 lb) 51.8 kg (114 lb 4.8 oz) 52 kg (114 lb 9.6 oz)    Examination:  General exam: Appears calm and comfortable  Respiratory system: Coarse breath sounds, no wheezing  Cardiovascular system: S1 & S2 heard, tachycardic, 4/6 systolic murmur  Gastrointestinal system: Abdomen is nondistended, soft and nontender. No organomegaly or masses felt. Normal bowel sounds heard. Central nervous system: Alert and oriented. No focal neurological deficits. Extremities: Symmetric 5 x 5 power. Skin: No rashes, lesions or ulcers Psychiatry: Judgement and insight appear normal. Mood & affect appropriate.   Data Reviewed: I have personally reviewed following labs and imaging studies  CBC:  Recent Labs Lab 11/27/16 1115 11/28/16 0236  WBC 14.2* 9.2  HGB 12.9 11.9*  HCT 37.6 34.9*  MCV 84.7 84.1  PLT 246 [id number]   Basic Metabolic Panel:  Recent Labs Lab 11/27/16 1115 11/28/16 0236  NA 133* 133*  K 3.8 3.2*  CL 98* 102  CO2 22 22  GLUCOSE 130* 131*  BUN 29* 23*  CREATININE 0.91 0.69  CALCIUM 9.1 8.6*   GFR: Estimated Creatinine Clearance: 42.7 mL/min (by C-G formula based on SCr of 0.69 mg/dL). Liver Function Tests: No results for input(s): AST, ALT, ALKPHOS, BILITOT, PROT, ALBUMIN in the last 168 hours. No results for input(s): LIPASE, AMYLASE in the last 168 hours. No results for input(s): AMMONIA in the last 168 hours. Coagulation Profile:  Recent Labs Lab 11/27/16 1115  INR 1.15   Cardiac Enzymes:  Recent Labs Lab  11/27/16 1824  TROPONINI <0.03   BNP (last 3 results) No results for input(s): PROBNP in the last 8760 hours. HbA1C: No results for input(s): HGBA1C in the last 72 hours. CBG: No results for input(s): GLUCAP in the last 168 hours. Lipid Profile: No results for input(s): CHOL, HDL, LDLCALC, TRIG, CHOLHDL, LDLDIRECT in the last 72 hours. Thyroid Function Tests:  Recent Labs  11/27/16 1824  TSH 2.994   Anemia Panel: No results for input(s): VITAMINB12, FOLATE, FERRITIN, TIBC, IRON, RETICCTPCT in the last 72 hours. Urine analysis:    Component Value Date/Time   COLORURINE YELLOW 09/04/2015 1404   APPEARANCEUR CLEAR 09/04/2015 1404   LABSPEC 1.036 (H) 09/04/2015 1404   PHURINE 7.0 09/04/2015 1404   GLUCOSEU NEGATIVE 09/04/2015 1404   HGBUR NEGATIVE 09/04/2015 1404   BILIRUBINUR NEGATIVE 09/04/2015 1404   KETONESUR 15 (A) 09/04/2015 1404   PROTEINUR NEGATIVE 09/04/2015 1404   UROBILINOGEN 0.2 09/04/2015 1404   NITRITE NEGATIVE 09/04/2015 1404   LEUKOCYTESUR NEGATIVE 09/04/2015 1404   Sepsis Labs: @LABRCNTIP (procalcitonin:4,lacticidven:4)   ) Recent Results (from the past 240 hour(s))  MRSA PCR Screening     Status: None   Collection Time: 11/28/16  6:25 AM  Result Value Ref Range Status   MRSA by PCR NEGATIVE NEGATIVE Final      Radiology Studies: Dg Chest Port 1 View Result Date: 11/27/2016 Right worse than left airspace disease most worrisome for pneumonia. Small bilateral pleural effusions. Cardiomegaly. Atherosclerosis.    Scheduled Meds: . amitriptyline  50 mg Oral QHS  . apixaban  5 mg Oral BID  . aspirin EC  81 mg Oral Daily  . ceFEPime (MAXIPIME) IV  1 g Intravenous Q24H  . cholecalciferol  1,000 Units Oral Daily  . darifenacin  7.5 mg Oral Daily  . furosemide  20 mg Intravenous BID  . levothyroxine  112 mcg Oral QAC breakfast  . multivitamin with minerals  1 tablet Oral Daily  . pantoprazole  40 mg Oral Daily  . pravastatin  20 mg Oral QPM  .  sodium chloride flush  3 mL Intravenous Q12H  . vancomycin  750 mg Intravenous Q24H   Continuous Infusions: . diltiazem (CARDIZEM) infusion 10 mg/hr (11/28/16 1100)     LOS: 1 day    Time spent: 25 minutes  Greater than 50% of the time spent on counseling and coordinating the care.   Leisa Lenz, MD [facility] Pager [phone]  If 7PM-7AM, please contact night-coverage [email or web address] Password [facility] 11/28/2016, 12:44 PM

## 2016-11-28 NOTE — Progress Notes (Signed)
  Echocardiogram 2D Echocardiogram has been performed.  [name] 11/28/2016, 10:00 AM

## 2016-11-28 NOTE — Plan of Care (Signed)
  Problem: Safety: Goal: Ability to remain free from injury will improve Outcome: Progressing   

## 2016-11-29 ENCOUNTER — Inpatient Hospital Stay (HOSPITAL_COMMUNITY): Payer: Medicare Other

## 2016-11-29 LAB — CBC
HCT: 34.6 % — ABNORMAL LOW (ref 36.0–46.0)
HCT: 35.6 % — ABNORMAL LOW (ref 36.0–46.0)
HEMOGLOBIN: 11.8 g/dL — AB (ref 12.0–15.0)
Hemoglobin: 12.2 g/dL (ref 12.0–15.0)
MCH: 28.6 pg (ref 26.0–34.0)
MCH: 28.5 pg (ref 26.0–34.0)
MCHC: 34.1 g/dL (ref 30.0–36.0)
MCHC: 34.3 g/dL (ref 30.0–36.0)
MCV: 84 fL (ref 78.0–100.0)
MCV: 83.2 fL (ref 78.0–100.0)
PLATELETS: 234 10*3/uL (ref 150–400)
Platelets: 225 10*3/uL (ref 150–400)
RBC: 4.12 MIL/uL (ref 3.87–5.11)
RBC: 4.28 MIL/uL (ref 3.87–5.11)
RDW: 14.2 % (ref 11.5–15.5)
RDW: 14.3 % (ref 11.5–15.5)
WBC: 9.9 10*3/uL (ref 4.0–10.5)
WBC: 11.4 10*3/uL — ABNORMAL HIGH (ref 4.0–10.5)

## 2016-11-29 LAB — BASIC METABOLIC PANEL
ANION GAP: 8 (ref 5–15)
BUN: 16 mg/dL (ref 6–20)
CO2: 25 mmol/L (ref 22–32)
Calcium: 8.5 mg/dL — ABNORMAL LOW (ref 8.9–10.3)
Chloride: 101 mmol/L (ref 101–111)
Creatinine, Ser: 0.69 mg/dL (ref 0.44–1.00)
GFR calc Af Amer: >60 mL/min (ref >60)
GFR calc non Af Amer: >60 mL/min (ref >60)
GLUCOSE: 111 mg/dL — AB (ref 65–99)
POTASSIUM: 3.8 mmol/L (ref 3.5–5.1)
Sodium: 134 mmol/L — ABNORMAL LOW (ref 135–145)

## 2016-11-29 LAB — MAGNESIUM: Magnesium: 1.9 mg/dL (ref 1.7–2.4)

## 2016-11-29 MED ORDER — KETOROLAC TROMETHAMINE 30 MG/ML IJ SOLN: 15.0000 mg | Freq: Four times a day (QID) | INTRAMUSCULAR | Status: AC | PRN

## 2016-11-29 MED ORDER — GI COCKTAIL ~~LOC~~
30.0000 mL | Freq: Once | ORAL | Status: AC
  Administered 2016-11-29: 30 mL via ORAL
  Filled 2016-11-29: qty 30

## 2016-11-29 MED ORDER — KETOROLAC TROMETHAMINE 30 MG/ML IJ SOLN: 15.0000 mg | Freq: Four times a day (QID) | INTRAMUSCULAR | Status: DC

## 2016-11-29 MED ORDER — BUTALBITAL-APAP-CAFFEINE 50-325-40 MG PO TABS
1.0000 | ORAL_TABLET | Freq: Once | ORAL | Status: AC
Start: 2016-11-29 — End: 2016-11-29
  Administered 2016-11-29: 1 via ORAL
  Filled 2016-11-29: qty 1

## 2016-11-29 MED ORDER — SENNOSIDES-DOCUSATE SODIUM 8.6-50 MG PO TABS
2.0000 | ORAL_TABLET | Freq: Every day | ORAL | Status: DC
  Administered 2016-11-29 – 2016-11-30 (×2): 2 via ORAL
  Filled 2016-11-29 (×7): qty 2

## 2016-11-29 MED ORDER — FUROSEMIDE 10 MG/ML IJ SOLN
20.0000 mg | Freq: Four times a day (QID) | INTRAMUSCULAR | Status: AC
  Administered 2016-11-29 – 2016-11-30 (×4): 20 mg via INTRAVENOUS
  Filled 2016-11-29 (×4): qty 2

## 2016-11-29 MED ORDER — POLYETHYLENE GLYCOL 3350 17 G PO PACK
17.0000 g | PACK | Freq: Every day | ORAL | Status: DC | PRN
  Administered 2016-11-29 – 2016-12-01 (×3): 17 g via ORAL
  Filled 2016-11-29 (×3): qty 1

## 2016-11-29 MED ORDER — DILTIAZEM HCL ER 60 MG PO CP12
120.0000 mg | ORAL_CAPSULE | Freq: Two times a day (BID) | ORAL | Status: DC
  Administered 2016-11-29 – 2016-12-01 (×5): 120 mg via ORAL
  Filled 2016-11-29 (×6): qty 2

## 2016-11-29 MED ORDER — KETOROLAC TROMETHAMINE 30 MG/ML IJ SOLN
30.0000 mg | Freq: Four times a day (QID) | INTRAMUSCULAR | Status: DC
  Administered 2016-11-29: 30 mg via INTRAVENOUS
  Filled 2016-11-29: qty 1

## 2016-11-29 MED ORDER — HYDROCORTISONE 1 % EX CREA
TOPICAL_CREAM | Freq: Every day | CUTANEOUS | Status: DC
  Administered 2016-12-01: 1 via TOPICAL
  Administered 2016-12-02 – 2016-12-10 (×7): via TOPICAL
  Filled 2016-11-29: qty 28

## 2016-11-29 NOTE — Discharge Instructions (Addendum)
Community-Acquired Pneumonia, Adult Introduction Pneumonia is an infection of the lungs. One type of pneumonia can happen while a person is in a hospital. A different type can happen when a person is not in a hospital (community-acquired pneumonia). It is easy for this kind to spread from person to person. It can spread to you if you breathe near an infected person who coughs or sneezes. Some symptoms include:  A dry cough.  A wet (productive) cough.  Fever.  Sweating.  Chest pain. Follow these instructions at home:  Take over-the-counter and prescription medicines only as told by your doctor.  Only take cough medicine if you are losing sleep.  If you were prescribed an antibiotic medicine, take it as told by your doctor. Do not stop taking the antibiotic even if you start to feel better.  Sleep with your head and neck raised (elevated). You can do this by putting a few pillows under your head, or you can sleep in a recliner.  Do not use tobacco products. These include cigarettes, chewing tobacco, and e-cigarettes. If you need help quitting, ask your doctor.  Drink enough water to keep your pee (urine) clear or pale yellow. A shot (vaccine) can help prevent pneumonia. Shots are often suggested for:  People older than 79 years of age.  People older than 79 years of age:  Who are having cancer treatment.  Who have long-term (chronic) lung disease.  Who have problems with their body's defense system (immune system). You may also prevent pneumonia if you take these actions:  Get the flu (influenza) shot every year.  Go to the dentist as often as told.  Wash your hands often. If soap and water are not available, use hand sanitizer. Contact a doctor if:  You have a fever.  You lose sleep because your cough medicine does not help. Get help right away if:  You are short of breath and it gets worse.  You have more chest pain.  Your sickness gets worse. This is very  serious if:  You are an older adult.  Your body's defense system is weak.  You cough up blood. This information is not intended to replace advice given to you by your health care provider. Make sure you discuss any questions you have with your health care provider. Document Released: 04/16/2008 Document Revised: 04/05/2016 Document Reviewed: 02/23/2015  2017 Elsevier Information on my medicine - ELIQUIS (apixaban)  This medication education was reviewed with me or my healthcare representative as part of my discharge preparation.    Why was Eliquis prescribed for you? Eliquis was prescribed for you to reduce the risk of a blood clot forming that can cause a stroke if you have a medical condition called atrial fibrillation (a type of irregular heartbeat).  What do You need to know about Eliquis ? Take your Eliquis TWICE DAILY - one tablet in the morning and one tablet in the evening with or without food. If you have difficulty swallowing the tablet whole please discuss with your pharmacist how to take the medication safely.  Take Eliquis exactly as prescribed by your doctor and DO NOT stop taking Eliquis without talking to the doctor who prescribed the medication.  Stopping may increase your risk of developing a stroke.  Refill your prescription before you run out.  After discharge, you should have regular check-up appointments with your healthcare provider that is prescribing your Eliquis.  In the future your dose may need to be changed if your kidney  function or weight changes by a significant amount or as you get older.  What do you do if you miss a dose? If you miss a dose, take it as soon as you remember on the same day and resume taking twice daily.  Do not take more than one dose of ELIQUIS at the same time to make up a missed dose.  Important Safety Information A possible side effect of Eliquis is bleeding. You should call your healthcare provider right away if you  experience any of the following: ? Bleeding from an injury or your nose that does not stop. ? Unusual colored urine (red or dark brown) or unusual colored stools (red or black). ? Unusual bruising for unknown reasons. ? A serious fall or if you hit your head (even if there is no bleeding).  Some medicines may interact with Eliquis and might increase your risk of bleeding or clotting while on Eliquis. To help avoid this, consult your healthcare provider or pharmacist prior to using any new prescription or non-prescription medications, including herbals, vitamins, non-steroidal anti-inflammatory drugs (NSAIDs) and supplements.  This website has more information on Eliquis (apixaban): http://www.eliquis.com/eliquis/home

## 2016-11-29 NOTE — Progress Notes (Signed)
Pt states feel much better after getting GI Cocktail, nursing will cont to monitor

## 2016-11-29 NOTE — Progress Notes (Signed)
During shift handoff/BS reporting with night RN, pt c/o HA and  not feeling good well all night. Stated had not said anything to night RN, pt VSS, Neuro Intact, MD updated via text page, nursing will cont to monitor

## 2016-11-29 NOTE — Progress Notes (Signed)
Patient Name: [name] Date of Encounter: 11/29/2016  Principal Problem:   Atrial fibrillation with rapid ventricular response (HCC) Active Problems:   Dyspnea   Bilateral leg edema   History of DVT of lower extremity   Essential hypertension, benign   CAP (community acquired pneumonia)   Community acquired pneumonia   Acute CHF ([place])   Lobar pneumonia, unspecified organism ([place])   Leukocytosis   Length of Stay: 2  SUBJECTIVE  She feels worse today, headache with associated nausea is her main complain.  CURRENT MEDS . amitriptyline  50 mg Oral QHS  . apixaban  5 mg Oral BID  . aspirin EC  81 mg Oral Daily  . ceFEPime (MAXIPIME) IV  1 g Intravenous Q24H  . cholecalciferol  1,000 Units Oral Daily  . darifenacin  7.5 mg Oral Daily  . furosemide  20 mg Intravenous BID  . ketorolac  15 mg Intravenous Q6H  . levothyroxine  112 mcg Oral QAC breakfast  . multivitamin with minerals  1 tablet Oral Daily  . pantoprazole  40 mg Oral Daily  . pravastatin  20 mg Oral QPM  . sodium chloride flush  3 mL Intravenous Q12H    OBJECTIVE  Vitals:   11/29/16 0000 11/29/16 0300 11/29/16 0400 11/29/16 0800  BP: 96/75 108/80 104/77 105/77  Pulse: 94 (!) 8 (!) 40 86  Resp: 19 20 16 17   Temp:  98.8 F (37.1 C)    TempSrc:  Oral    SpO2: 93% 92% 94% 91%  Weight:  114 lb 10.2 oz (52 kg)    Height:        Intake/Output Summary (Last 24 hours) at 11/29/16 1201 Last data filed at 11/29/16 0900  Gross per 24 hour  Intake              870 ml  Output             1475 ml  Net             -605 ml   Filed Weights   11/27/16 2300 11/28/16 0500 11/29/16 0300  Weight: 114 lb 4.8 oz (51.8 kg) 114 lb 9.6 oz (52 kg) 114 lb 10.2 oz (52 kg)    PHYSICAL EXAM  General: Pleasant, NAD. Neuro: Alert and oriented X 3. Moves all extremities spontaneously. Psych: Normal affect. HEENT:  Normal  Neck: Supple without bruits or JVD. Lungs:  Resp regular and unlabored, crackles B/L, no  wheezing Heart: iRRR no s3, s4, 4/8 holosystolic murmur. Abdomen: Soft, non-tender, non-distended, BS + x 4.  Extremities: No clubbing, cyanosis or edema. DP/PT/Radials 2+ and equal bilaterally.  Accessory Clinical Findings  CBC  Recent Labs  11/29/16 0239 11/29/16 1101  WBC 9.9 11.4*  HGB 11.8* 12.2  HCT 34.6* 35.6*  MCV 84.0 83.2  PLT 225 [id number]   Basic Metabolic Panel  Recent Labs  11/28/16 0236 11/29/16 0239  NA 133* 134*  K 3.2* 3.8  CL 102 101  CO2 22 25  GLUCOSE 131* 111*  BUN 23* 16  CREATININE 0.69 0.69  CALCIUM 8.6* 8.5*  MG  --  1.9   Liver Function Tests No results for input(s): AST, ALT, ALKPHOS, BILITOT, PROT, ALBUMIN in the last 72 hours. No results for input(s): LIPASE, AMYLASE in the last 72 hours. Cardiac Enzymes  Recent Labs  11/27/16 1824  TROPONINI <0.03    Recent Labs  11/27/16 1824  TSH 2.994    Radiology/Studies  Dg Chest  Port 1 View  Result Date: 11/27/2016 CLINICAL DATA:  Shortness of breath and cough for 3 weeks. EXAM: PORTABLE CHEST 1 VIEW COMPARISON:  Single-view of the chest 09/10/2015. FINDINGS: Airspace disease is seen in the right upper and lower lung zones and left lung base. There is cardiomegaly. Aortic atherosclerosis is noted. No pneumothorax. There appear to be small bilateral pleural effusions. IMPRESSION: Right worse than left airspace disease most worrisome for pneumonia. Small bilateral pleural effusions. Cardiomegaly. Atherosclerosis. Electronically Signed   By: Inge Rise M.D.   On: 11/27/2016 12:06    TELE: a-fib with ventricular rate 70-100  TTE: 11/28/2016  - Left ventricle: The cavity size was normal. There was mild   concentric hypertrophy. Systolic function was normal. The   estimated ejection fraction was in the range of 55% to 60%. Wall   motion was normal; there were no regional wall motion   abnormalities. The study was not technically sufficient to allow   evaluation of LV diastolic  dysfunction due to atrial   fibrillation. - Aortic valve: There was mild to moderate regurgitation. - Mitral valve: There is thickening of both leaflets with bileaflet   prolapse posterior > anterior. There is centrally directed jet of   at least moderate mitral regurgitation. - Left atrium: The atrium was severely dilated. - Right atrium: The atrium was moderately dilated. - Tricuspid valve: There was severe regurgitation. - Pulmonary arteries: Systolic pressure was severely increased. PA   peak pressure: 67 mm Hg (S). - Inferior vena cava: The vessel was dilated. The respirophasic   diameter changes were blunted (< 50%), consistent with elevated   central venous pressure.   ASSESSMENT AND PLAN  New onset a-fib with RVR CAP Acute CHF  ATB per primary team. Increase lasix 20 mg iv Q6H x 4 doses.  Continue Eliquis.  Switch Cardizem drip to Cardizem SR120 mg BID.  I have personally reviewed her echocardiogram, she has normal LVEF, with MVP and at least moderate MR with severely dilated left atrium and severe pulmonary hypertesion RVSP 68 mmHg.  Her chance of cardioverting is low, eventhough she wasn't in a-fib in October, we will focus on rate control.  Potassium 3.8 after replacement yesterday. Fioricet for headaches - has caffeine, but no aspirin.  Signed, Ena Dawley MD, [facility] 11/29/2016

## 2016-11-29 NOTE — Evaluation (Signed)
Physical Therapy Evaluation Patient Details Name: [name] MRN: [id number] DOB: May 14, 1936 Today's Date: 11/29/2016   History of Present Illness  Pt adm with afib with rvr and pna. PMH - osteoporosis, htn, breast ca, acdf, multiple vertebral fx's  Clinical Impression  Pt admitted with above diagnosis and presents to PT with functional limitations due to deficits listed below (See PT problem list). Pt needs skilled PT to maximize independence and safety to allow discharge to home with HHPT. Pt adamantly refuses SNF (pt's has experience with husband in SNF prior to his death). Hopefully pt will be able to have some assistance from friends at dc. Expect pt will make good progress as she begins to feel better.     Follow Up Recommendations Home health PT;Supervision for mobility/OOB (pt refuses SNF)    Equipment Recommendations  None recommended by PT    Recommendations for Other Services       Precautions / Restrictions Precautions Precautions: Fall Restrictions Weight Bearing Restrictions: No      Mobility  Bed Mobility Overal bed mobility: Needs Assistance Bed Mobility: Supine to Sit;Sit to Supine     Supine to sit: Min guard Sit to supine: Min guard   General bed mobility comments: for lines and safety  Transfers Overall transfer level: Needs assistance Equipment used: Rolling walker (2 wheeled) Transfers: Sit to/from Stand Sit to Stand: Min assist         General transfer comment: Assist to bring hips up and for balance  Ambulation/Gait Ambulation/Gait assistance: Min assist Ambulation Distance (Feet): 10 Feet (x 2) Assistive device: Rolling walker (2 wheeled) Gait Pattern/deviations: Step-through pattern;Decreased step length - right;Decreased step length - left;Shuffle;Trunk flexed Gait velocity: decr Gait velocity interpretation: <1.8 ft/sec, indicative of risk for recurrent falls General Gait Details: Assist for balance and support. Dyspnea 3/4 on  RA with SpO2 >92%. Fatigues very quickly  Financial trader Rankin (Stroke Patients Only)       Balance Overall balance assessment: Needs assistance Sitting-balance support: No upper extremity supported;Feet supported Sitting balance-Leahy Scale: Good     Standing balance support: Bilateral upper extremity supported Standing balance-Leahy Scale: Poor Standing balance comment: UE support                             Pertinent Vitals/Pain Pain Assessment: Faces Faces Pain Scale: Hurts little more Pain Location: head Pain Descriptors / Indicators: Aching Pain Intervention(s): Limited activity within patient's tolerance    Home Living Family/patient expects to be discharged to:: Private residence Living Arrangements: Alone Available Help at Discharge: Friend(s);Available PRN/intermittently Type of Home: Apartment Home Access: Ramped entrance     Home Layout: One level Home Equipment: Walker - 4 wheels;Walker - 2 wheels;Cane - single point;Shower seat;Hand held shower head      Prior Function Level of Independence: Independent with assistive device(s)         Comments: Uses walker when out of house or if up at night     Hand Dominance   Dominant Hand: Right    Extremity/Trunk Assessment   Upper Extremity Assessment Upper Extremity Assessment: Generalized weakness    Lower Extremity Assessment Lower Extremity Assessment: Generalized weakness    Cervical / Trunk Assessment Cervical / Trunk Assessment: Kyphotic  Communication   Communication: No difficulties  Cognition Arousal/Alertness: Awake/alert Behavior During Therapy: WFL for tasks assessed/performed Overall Cognitive Status:  Within Functional Limits for tasks assessed                      General Comments      Exercises     Assessment/Plan    PT Assessment Patient needs continued PT services  PT Problem List Decreased  strength;Decreased activity tolerance;Decreased balance;Decreased mobility;Cardiopulmonary status limiting activity          PT Treatment Interventions DME instruction;Gait training;Functional mobility training;Therapeutic activities;Therapeutic exercise;Balance training;Patient/family education    PT Goals (Current goals can be found in the Care Plan section)  Acute Rehab PT Goals Patient Stated Goal: return home PT Goal Formulation: With patient Time For Goal Achievement: 12/06/16 Potential to Achieve Goals: Good    Frequency Min 3X/week   Barriers to discharge Decreased caregiver support Lives alone    Co-evaluation               End of Session   Activity Tolerance: Patient limited by fatigue Patient left: in bed;with call bell/phone within reach Nurse Communication: Mobility status (nurse tech)         Time: [id number] PT Time Calculation (min) (ACUTE ONLY): 22 min   Charges:   PT Evaluation $PT Eval Moderate Complexity: 1 Procedure     PT G CodesShary Decamp [place] 2016-12-19, 2:01 PM [employer] PT [phone]

## 2016-11-29 NOTE — Progress Notes (Signed)
Patient ID: [name], female   DOB: 05-24-1936, 81 y.o.   MRN: [id number]  PROGRESS NOTE    [name]  [id number] DOB: 1935-12-27 DOA: 11/27/2016  PCP: [name], MD   Brief Narrative:  81 y.o. female with history of osteoporosis, dyslipidemia, hypertension, hypothyroidism, breast Ca (lumpx/ XRT 2012-3), history of DVT in 2012, anxiety. She presented to ED with worsening shortness of breath and fatigue for past 3 weeks prior to this admission. No reports of chest pan. On admission, she was in A fib with RVR. CXR showed pulmonary infiltrates, right ,ore than left concerning for pneumonia. Cardiology consulted for Afib with RVR. Pt started on IV Cardizem.    Assessment & Plan:  Acute respiratory failure with hypoxia / Lobar pneumonia, unspecified organism / Leukocytosis  - Blood cx negative so far - Influenza negative  - Stop vanco and continue cefepime   - Stable respiratory status this am   Atrial fibrillation with RVR - CHADS vasc score 4 - Normal troponin level  - Continue anticoagulation with aspirin and eliquis  - Rate controlled with Cardizem drip - Appreciate cardio following   Acute on chronic diastolic CHF - BNP on admission 443 - 2 D ECHO with preserved EF - Continue lasix 20 mg IV BID - Daily weight and strict intake and output  - Weight 11/27/2106 was 51.8 kg --> weight this am 52 kg   Hypokalemia - Due to lasix - Supplemented and WNL - Follow up BMP in am  GERD without esophagitis - Continue protonix   Anxiety  - Continue amitriptyline   Dyslipidemia - Continue Pravachol  Headache - Added Toradol, pharmacy to adjust to 15 mg instead of 30 mg every 6 hours for next 2-3 doses depending on if headache improves   DVT prophylaxis: On apixaban  Code Status: full code  Family Communication: No family at the bedside this am  Disposition Plan: remains in SDU because she requires Cardizem drip    Consultants:    Cardio PT  Procedures:   None   Antimicrobials:   Cefepime 11/27/16 -->  Vanco 11/27/16 --> 11/29/2016    Subjective: No overnight events.   Objective: Vitals:   11/29/16 0000 11/29/16 0300 11/29/16 0400 11/29/16 0800  BP: 96/75 108/80 104/77 105/77  Pulse: 94 (!) 8 (!) 40 86  Resp: 19 20 16 17   Temp:  98.8 F (37.1 C)    TempSrc:  Oral    SpO2: 93% 92% 94% 91%  Weight:  52 kg (114 lb 10.2 oz)    Height:        Intake/Output Summary (Last 24 hours) at 11/29/16 1044 Last data filed at 11/29/16 0900  Gross per 24 hour  Intake              890 ml  Output             1475 ml  Net             -585 ml   Filed Weights   11/27/16 2300 11/28/16 0500 11/29/16 0300  Weight: 51.8 kg (114 lb 4.8 oz) 52 kg (114 lb 9.6 oz) 52 kg (114 lb 10.2 oz)    Examination:  General exam: Appears calm and comfortable, no distress  Respiratory system: Coarse breath sounds, no rhonchi, no wheezing  Cardiovascular system: S1 & S2 heard, tachycardic, 4/6 systolic murmur  Gastrointestinal system: (+) BS, non tender  Central nervous system: No focal neurological deficits. Extremities: No edema, palpable  pulses  Skin: Skin is warm and dry  Psychiatry: Normal mood and behavior   Data Reviewed: I have personally reviewed following labs and imaging studies  CBC:  Recent Labs Lab 11/27/16 1115 11/28/16 0236 11/29/16 0239  WBC 14.2* 9.2 9.9  HGB 12.9 11.9* 11.8*  HCT 37.6 34.9* 34.6*  MCV 84.7 84.1 84.0  PLT 246 226 [id number]   Basic Metabolic Panel:  Recent Labs Lab 11/27/16 1115 11/28/16 0236 11/29/16 0239  NA 133* 133* 134*  K 3.8 3.2* 3.8  CL 98* 102 101  CO2 22 22 25   GLUCOSE 130* 131* 111*  BUN 29* 23* 16  CREATININE 0.91 0.69 0.69  CALCIUM 9.1 8.6* 8.5*  MG  --   --  1.9   GFR: Estimated Creatinine Clearance: 42.7 mL/min (by C-G formula based on SCr of 0.69 mg/dL). Liver Function Tests: No results for input(s): AST, ALT, ALKPHOS, BILITOT, PROT, ALBUMIN in the last  168 hours. No results for input(s): LIPASE, AMYLASE in the last 168 hours. No results for input(s): AMMONIA in the last 168 hours. Coagulation Profile:  Recent Labs Lab 11/27/16 1115  INR 1.15   Cardiac Enzymes:  Recent Labs Lab 11/27/16 1824  TROPONINI <0.03   BNP (last 3 results) No results for input(s): PROBNP in the last 8760 hours. HbA1C: No results for input(s): HGBA1C in the last 72 hours. CBG: No results for input(s): GLUCAP in the last 168 hours. Lipid Profile: No results for input(s): CHOL, HDL, LDLCALC, TRIG, CHOLHDL, LDLDIRECT in the last 72 hours. Thyroid Function Tests:  Recent Labs  11/27/16 1824  TSH 2.994   Anemia Panel: No results for input(s): VITAMINB12, FOLATE, FERRITIN, TIBC, IRON, RETICCTPCT in the last 72 hours. Urine analysis:    Component Value Date/Time   COLORURINE YELLOW 09/04/2015 1404   APPEARANCEUR CLEAR 09/04/2015 1404   LABSPEC 1.036 (H) 09/04/2015 1404   PHURINE 7.0 09/04/2015 1404   GLUCOSEU NEGATIVE 09/04/2015 1404   HGBUR NEGATIVE 09/04/2015 1404   BILIRUBINUR NEGATIVE 09/04/2015 1404   KETONESUR 15 (A) 09/04/2015 1404   PROTEINUR NEGATIVE 09/04/2015 1404   UROBILINOGEN 0.2 09/04/2015 1404   NITRITE NEGATIVE 09/04/2015 1404   LEUKOCYTESUR NEGATIVE 09/04/2015 1404   Sepsis Labs: @LABRCNTIP (procalcitonin:4,lacticidven:4)   ) Recent Results (from the past 240 hour(s))  MRSA PCR Screening     Status: None   Collection Time: 11/28/16  6:25 AM  Result Value Ref Range Status   MRSA by PCR NEGATIVE NEGATIVE Final      Radiology Studies: Dg Chest Port 1 View Result Date: 11/27/2016 Right worse than left airspace disease most worrisome for pneumonia. Small bilateral pleural effusions. Cardiomegaly. Atherosclerosis.    Scheduled Meds: . amitriptyline  50 mg Oral QHS  . apixaban  5 mg Oral BID  . aspirin EC  81 mg Oral Daily  . ceFEPime (MAXIPIME) IV  1 g Intravenous Q24H  . cholecalciferol  1,000 Units Oral Daily  .  darifenacin  7.5 mg Oral Daily  . furosemide  20 mg Intravenous BID  . ketorolac  15 mg Intravenous Q6H  . levothyroxine  112 mcg Oral QAC breakfast  . multivitamin with minerals  1 tablet Oral Daily  . pantoprazole  40 mg Oral Daily  . pravastatin  20 mg Oral QPM  . sodium chloride flush  3 mL Intravenous Q12H  . vancomycin  750 mg Intravenous Q24H   Continuous Infusions: . diltiazem (CARDIZEM) infusion 10 mg/hr (11/29/16 0126)     LOS: 2 days  Time spent: 25 minutes  Greater than 50% of the time spent on counseling and coordinating the care.   Leisa Lenz, MD [facility] Pager [phone]  If 7PM-7AM, please contact night-coverage [email or web address] Password [facility] 11/29/2016, 10:44 AM

## 2016-11-29 NOTE — Progress Notes (Addendum)
Pt still not feeling well, headache better with Toradol but overall does not feel good. We will repeat CXR this am. Leisa Lenz [facility] [phone]

## 2016-11-30 DIAGNOSIS — E876 Hypokalemia: Secondary | ICD-10-CM | POA: Diagnosis present

## 2016-11-30 DIAGNOSIS — D72828 Other elevated white blood cell count: Secondary | ICD-10-CM

## 2016-11-30 LAB — CBC
HCT: 34.1 % — ABNORMAL LOW (ref 36.0–46.0)
Hemoglobin: 11.7 g/dL — ABNORMAL LOW (ref 12.0–15.0)
MCH: 28.5 pg (ref 26.0–34.0)
MCHC: 34.3 g/dL (ref 30.0–36.0)
MCV: 83.2 fL (ref 78.0–100.0)
PLATELETS: 235 10*3/uL (ref 150–400)
RBC: 4.1 MIL/uL (ref 3.87–5.11)
RDW: 14.2 % (ref 11.5–15.5)
WBC: 8.9 10*3/uL (ref 4.0–10.5)

## 2016-11-30 LAB — BASIC METABOLIC PANEL
Anion gap: 7 (ref 5–15)
BUN: 20 mg/dL (ref 6–20)
CO2: 25 mmol/L (ref 22–32)
CREATININE: 0.83 mg/dL (ref 0.44–1.00)
Calcium: 8.3 mg/dL — ABNORMAL LOW (ref 8.9–10.3)
Chloride: 101 mmol/L (ref 101–111)
GFR calc Af Amer: >60 mL/min (ref >60)
GLUCOSE: 100 mg/dL — AB (ref 65–99)
Potassium: 3.5 mmol/L (ref 3.5–5.1)
SODIUM: 133 mmol/L — AB (ref 135–145)

## 2016-11-30 MED ORDER — BUTALBITAL-APAP-CAFFEINE 50-325-40 MG PO TABS: 1.0000 | ORAL_TABLET | Freq: Four times a day (QID) | ORAL | Status: DC | PRN

## 2016-11-30 MED ORDER — ENSURE ENLIVE PO LIQD
237.0000 mL | Freq: Two times a day (BID) | ORAL | Status: DC
  Administered 2016-12-01 – 2016-12-07 (×4): 237 mL via ORAL

## 2016-11-30 NOTE — Progress Notes (Signed)
Initial Nutrition Assessment  DOCUMENTATION CODES:   Non-severe (moderate) malnutrition in context of social or environmental circumstances  INTERVENTION:  Ensure Enlive po BID, each supplement provides 350 kcal and 20 grams protein.    NUTRITION DIAGNOSIS:   Malnutrition related to poor appetite, social / environmental circumstances as evidenced by mild depletion of muscle mass, energy intake < 75% for > or equal to 3 months, moderate depletions of muscle mass.  GOAL:   Patient will meet greater than or equal to 90% of their needs  MONITOR:   PO intake, Supplement acceptance, Labs, Weight trends, Skin, I & O's  REASON FOR ASSESSMENT:   Consult Poor PO  ASSESSMENT:   81 y.o. female presenting with afib with RVR. 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   Pt is upset from the recent loss of her husband and attributes loss of appetite on the "stress" surrounding this social/environmental circumstance. Discussed with RN and PT prior to meeting and they stated pt seemed "depressed", weak, and not eating well. Pt has had poor po intake, <50% meal completion. Per the nutrition focused physical exam pt showed mild/moderate depletion of muscle mass in the temple, clavicle bone, acromion bone, and patellar region.   Pt stated for breakfast she would consume a Premium Protein drink and a bagel, a pack of peanut butter crackers for lunch and for dinner some [employer] broccoli and cheddar soup with corn. Given discussion with pt, suspect she is not consuming 100% of meals at home.  Pt was interacting well while discussing nutrition and stated PTA she was drinking Premium Protein supplements for breakfast. Provided pt Ensure supplement to try and she was agreeable to continue the use of this nutrition supplement.   Diet Order:  Diet Heart Room service appropriate? Yes; Fluid consistency: Thin  Skin:  Reviewed, no  issues  Last BM:  1/15  Height:   Ht Readings from Last 1 Encounters:  11/27/16 4\' 11"  (1.499 m)    Weight:   Wt Readings from Last 1 Encounters:  11/30/16 116 lb 1.6 oz (52.7 kg)    Ideal Body Weight:  44.7 kg  BMI:  Body mass index is 23.45 kg/m.  Estimated Nutritional Needs:   Kcal:  1300-1500  Protein:  65-75 grams  Fluid:  >/= 1.5 L  EDUCATION NEEDS:   Education needs addressed  [employer] Dietetic Intern

## 2016-11-30 NOTE — Progress Notes (Addendum)
Patient ID: [name], female   DOB: 10-28-1936, 81 y.o.   MRN: [id number]  PROGRESS NOTE    [name]  [id number] DOB: 04/08/1936 DOA: 11/27/2016  PCP: [name], MD   Brief Narrative:  81 y.o. female with history of osteoporosis, dyslipidemia, hypertension, hypothyroidism, breast Ca (lumpx/ XRT 2012-3), history of DVT in 2012, anxiety. She presented to ED with worsening shortness of breath and fatigue for past 3 weeks prior to this admission. No reports of chest pan. On admission, she was in A fib with RVR. CXR showed pulmonary infiltrates, right ,ore than left concerning for pneumonia. Cardiology consulted for Afib with RVR. Pt started on IV Cardizem.    Assessment & Plan:  Acute respiratory failure with hypoxia / Lobar pneumonia, right upper and lower lobe unspecified organism / Leukocytosis  - Blood cx negative so far - Influenza negative  - WBC count WNL - Stopped vanco 1/18 and continued cefepime  - CXR yesterday 1/18 showed again noted asymmetric streaky airspace disease in right upper lobe and right lower lobe highly suspicious for pneumonia  Atrial fibrillation with RVR - CHADS vasc score 4 - Normal troponin level  - Continue anticoagulation with aspirin and eliquis  - Rate controlled with Cardizem drip which was changed to PO by cardio 1/18, currently on Cardizem 120 mg Q 12 hours  - Appreciate cardio following and their recommendations  - Will transfer to telemetry floor today   Acute on chronic diastolic CHF - BNP on admission 443 - 2 D ECHO with preserved EF - Weight is 52.7 kg this am; weight 52 kg on 1/18 - Diuresed 2.4 L and now euvolemic - Was on lasix 20 mg IV BID; cardio stopped lasix today - Continue daily weight and strict intake and output  Hypokalemia - Due to lasix - Supplemented and WNL  LLE DVT in 2012 - on AC with eliquis   GERD without esophagitis - Continue protonix   Anxiety  - Continue amitriptyline    Dyslipidemia - Continue Pravachol  Headache - Toradol and Fioricet   DVT prophylaxis: On apixaban  Code Status: full code  Family Communication: No family at the bedside this am  Disposition Plan: transfer to telemetry floor today    Consultants:   Cardio PT  Procedures:   None   Antimicrobials:   Cefepime 11/27/16 -->  Vanco 11/27/16 --> 11/29/2016    Subjective: No overnight events.   Objective: Vitals:   11/30/16 0300 11/30/16 0400 11/30/16 0500 11/30/16 0746  BP: (!) 99/56 108/78 107/88 110/76  Pulse: 90 (!) 46 67 (!) 59  Resp: 14 16 17  (!) 33  Temp:    97.6 F (36.4 C)  TempSrc:    Oral  SpO2: 97% 95% 95% 93%  Weight: 52.7 kg (116 lb 1.6 oz)     Height:        Intake/Output Summary (Last 24 hours) at 11/30/16 1216 Last data filed at 11/30/16 0918  Gross per 24 hour  Intake              560 ml  Output             3076 ml  Net            -2516 ml   Filed Weights   11/28/16 0500 11/29/16 0300 11/30/16 0300  Weight: 52 kg (114 lb 9.6 oz) 52 kg (114 lb 10.2 oz) 52.7 kg (116 lb 1.6 oz)    Examination:  General exam:  No distress  Respiratory system: diminished breath sounds, no wheezing  Cardiovascular system: S1 & S2 heard, tachycardic, 4/6 systolic murmur  Gastrointestinal system: (+) BS, non tender, non distended   Central nervous system: Nonfocal  Extremities: No LE edema, palpable pulses  Skin: Skin is warm and dry, no lesions or ulcers  Psychiatry: Normal mood and behavior, normal affect   Data Reviewed: I have personally reviewed following labs and imaging studies  CBC:  Recent Labs Lab 11/27/16 1115 11/28/16 0236 11/29/16 0239 11/29/16 1101 11/30/16 0303  WBC 14.2* 9.2 9.9 11.4* 8.9  HGB 12.9 11.9* 11.8* 12.2 11.7*  HCT 37.6 34.9* 34.6* 35.6* 34.1*  MCV 84.7 84.1 84.0 83.2 83.2  PLT 246 226 225 234 [id number]   Basic Metabolic Panel:  Recent Labs Lab 11/27/16 1115 11/28/16 0236 11/29/16 0239 11/30/16 0303  NA 133* 133* 134*  133*  K 3.8 3.2* 3.8 3.5  CL 98* 102 101 101  CO2 22 22 25 25   GLUCOSE 130* 131* 111* 100*  BUN 29* 23* 16 20  CREATININE 0.91 0.69 0.69 0.83  CALCIUM 9.1 8.6* 8.5* 8.3*  MG  --   --  1.9  --    GFR: Estimated Creatinine Clearance: 41.4 mL/min (by C-G formula based on SCr of 0.83 mg/dL). Liver Function Tests: No results for input(s): AST, ALT, ALKPHOS, BILITOT, PROT, ALBUMIN in the last 168 hours. No results for input(s): LIPASE, AMYLASE in the last 168 hours. No results for input(s): AMMONIA in the last 168 hours. Coagulation Profile:  Recent Labs Lab 11/27/16 1115  INR 1.15   Cardiac Enzymes:  Recent Labs Lab 11/27/16 1824  TROPONINI <0.03   BNP (last 3 results) No results for input(s): PROBNP in the last 8760 hours. HbA1C: No results for input(s): HGBA1C in the last 72 hours. CBG: No results for input(s): GLUCAP in the last 168 hours. Lipid Profile: No results for input(s): CHOL, HDL, LDLCALC, TRIG, CHOLHDL, LDLDIRECT in the last 72 hours. Thyroid Function Tests:  Recent Labs  11/27/16 1824  TSH 2.994   Anemia Panel: No results for input(s): VITAMINB12, FOLATE, FERRITIN, TIBC, IRON, RETICCTPCT in the last 72 hours. Urine analysis:    Component Value Date/Time   COLORURINE YELLOW 09/04/2015 1404   APPEARANCEUR CLEAR 09/04/2015 1404   LABSPEC 1.036 (H) 09/04/2015 1404   PHURINE 7.0 09/04/2015 1404   GLUCOSEU NEGATIVE 09/04/2015 1404   HGBUR NEGATIVE 09/04/2015 1404   BILIRUBINUR NEGATIVE 09/04/2015 1404   KETONESUR 15 (A) 09/04/2015 1404   PROTEINUR NEGATIVE 09/04/2015 1404   UROBILINOGEN 0.2 09/04/2015 1404   NITRITE NEGATIVE 09/04/2015 1404   LEUKOCYTESUR NEGATIVE 09/04/2015 1404   Sepsis Labs: @LABRCNTIP (procalcitonin:4,lacticidven:4)   ) Recent Results (from the past 240 hour(s))  MRSA PCR Screening     Status: None   Collection Time: 11/28/16  6:25 AM  Result Value Ref Range Status   MRSA by PCR NEGATIVE NEGATIVE Final      Radiology  Studies: Dg Chest Port 1 View Result Date: 11/27/2016 Right worse than left airspace disease most worrisome for pneumonia. Small bilateral pleural effusions. Cardiomegaly. Atherosclerosis.   Dg Chest Port 1 View Result Date: 11/29/2016 Again noted asymmetric streaky airspace disease in right upper lobe and right lower lobe highly suspicious for pneumonia. Small left pleural effusion with left basilar atelectasis or infiltrate. No pulmonary edema. Electronically Signed   By: Lahoma Crocker M.D.   On: 11/29/2016 12:43      Scheduled Meds: . amitriptyline  50 mg Oral QHS  .  apixaban  5 mg Oral BID  . aspirin EC  81 mg Oral Daily  . ceFEPime (MAXIPIME) IV  1 g Intravenous Q24H  . cholecalciferol  1,000 Units Oral Daily  . darifenacin  7.5 mg Oral Daily  . diltiazem  120 mg Oral Q12H  . hydrocortisone cream   Topical QHS  . levothyroxine  112 mcg Oral QAC breakfast  . multivitamin with minerals  1 tablet Oral Daily  . pantoprazole  40 mg Oral Daily  . pravastatin  20 mg Oral QPM  . senna-docusate  2 tablet Oral QHS  . sodium chloride flush  3 mL Intravenous Q12H   Continuous Infusions:    LOS: 3 days    Time spent: 25 minutes  Greater than 50% of the time spent on counseling and coordinating the care.   Leisa Lenz, MD [facility] Pager [phone]  If 7PM-7AM, please contact night-coverage [email or web address] Password [email or web address] 11/30/2016, 12:16 PM

## 2016-11-30 NOTE — Progress Notes (Signed)
   11/30/16 1100  Clinical Encounter Type  Visited With Patient  Visit Type Initial  Referral From Nurse  Consult/Referral To Chaplain  Recommendations (follow up)  Stress Factors  Patient Stress Factors Major life changes  Pt. Is resting, did not request Chaplain/Pastoral Services.  Introduces Control and instrumentation engineer and referral to spiritual wellness is further needed pastoral care, spiritual need.  Chaplain Lilyonna Steidle A. [place], [place] , BA-REL/PHIL  [phone]

## 2016-11-30 NOTE — Progress Notes (Signed)
Patient Name: [name] Date of Encounter: 11/30/2016  Principal Problem:   Atrial fibrillation with RVR ([place]) Active Problems:   Dyspnea   Bilateral leg edema   History of DVT of lower extremity   Essential hypertension, benign   CAP (community acquired pneumonia)   Community acquired pneumonia   Acute CHF ([place])   Lobar pneumonia, unspecified organism ([place])   Leukocytosis   Length of Stay: 3  SUBJECTIVE  She is complaining of back pain, she hasn't been out of bed in days. Headache has improved.  CURRENT MEDS . amitriptyline  50 mg Oral QHS  . apixaban  5 mg Oral BID  . aspirin EC  81 mg Oral Daily  . ceFEPime (MAXIPIME) IV  1 g Intravenous Q24H  . cholecalciferol  1,000 Units Oral Daily  . darifenacin  7.5 mg Oral Daily  . diltiazem  120 mg Oral Q12H  . hydrocortisone cream   Topical QHS  . levothyroxine  112 mcg Oral QAC breakfast  . multivitamin with minerals  1 tablet Oral Daily  . pantoprazole  40 mg Oral Daily  . pravastatin  20 mg Oral QPM  . senna-docusate  2 tablet Oral QHS  . sodium chloride flush  3 mL Intravenous Q12H   OBJECTIVE  Vitals:   11/30/16 0300 11/30/16 0400 11/30/16 0500 11/30/16 0746  BP: (!) 99/56 108/78 107/88 110/76  Pulse: 90 (!) 46 67 (!) 59  Resp: 14 16 17  (!) 33  Temp:    97.6 F (36.4 C)  TempSrc:    Oral  SpO2: 97% 95% 95% 93%  Weight: 116 lb 1.6 oz (52.7 kg)     Height:        Intake/Output Summary (Last 24 hours) at 11/30/16 1022 Last data filed at 11/30/16 0918  Gross per 24 hour  Intake              580 ml  Output             3076 ml  Net            -2496 ml   Filed Weights   11/28/16 0500 11/29/16 0300 11/30/16 0300  Weight: 114 lb 9.6 oz (52 kg) 114 lb 10.2 oz (52 kg) 116 lb 1.6 oz (52.7 kg)    PHYSICAL EXAM  General: Pleasant, NAD. Neuro: Alert and oriented X 3. Moves all extremities spontaneously. Psych: Normal affect. HEENT:  Normal  Neck: Supple without bruits or JVD. Lungs:  Resp regular  and unlabored, crackles B/L, no wheezing Heart: iRRR no s3, s4, 4/8 holosystolic murmur. Abdomen: Soft, non-tender, non-distended, BS + x 4.  Extremities: No clubbing, cyanosis or edema. DP/PT/Radials 2+ and equal bilaterally.  Accessory Clinical Findings  CBC  Recent Labs  11/29/16 1101 11/30/16 0303  WBC 11.4* 8.9  HGB 12.2 11.7*  HCT 35.6* 34.1*  MCV 83.2 83.2  PLT 234 [id number]   Basic Metabolic Panel  Recent Labs  11/29/16 0239 11/30/16 0303  NA 134* 133*  K 3.8 3.5  CL 101 101  CO2 25 25  GLUCOSE 111* 100*  BUN 16 20  CREATININE 0.69 0.83  CALCIUM 8.5* 8.3*  MG 1.9  --    Liver Function Tests No results for input(s): AST, ALT, ALKPHOS, BILITOT, PROT, ALBUMIN in the last 72 hours. No results for input(s): LIPASE, AMYLASE in the last 72 hours. Cardiac Enzymes  Recent Labs  11/27/16 1824  TROPONINI <0.03    Recent Labs  11/27/16  1824  TSH 2.994    Radiology/Studies  Dg Chest Port 1 View  Result Date: 11/29/2016 CLINICAL DATA:  Cough, pneumonia EXAM: PORTABLE CHEST 1 VIEW COMPARISON:  11/27/2016 FINDINGS: Again noted asymmetric streaky airspace disease in right upper lobe and right lower lobe highly suspicious for pneumonia. Small left pleural effusion with left basilar atelectasis or infiltrate. Atherosclerotic calcifications of thoracic aorta. No convincing pulmonary edema. IMPRESSION: Again noted asymmetric streaky airspace disease in right upper lobe and right lower lobe highly suspicious for pneumonia. Small left pleural effusion with left basilar atelectasis or infiltrate. No pulmonary edema. Electronically Signed   By: Lahoma Crocker M.D.   On: 11/29/2016 12:43   Dg Chest Port 1 View  Result Date: 11/27/2016 CLINICAL DATA:  Shortness of breath and cough for 3 weeks. EXAM: PORTABLE CHEST 1 VIEW COMPARISON:  Single-view of the chest 09/10/2015. FINDINGS: Airspace disease is seen in the right upper and lower lung zones and left lung base. There is cardiomegaly.  Aortic atherosclerosis is noted. No pneumothorax. There appear to be small bilateral pleural effusions. IMPRESSION: Right worse than left airspace disease most worrisome for pneumonia. Small bilateral pleural effusions. Cardiomegaly. Atherosclerosis. Electronically Signed   By: Inge Rise M.D.   On: 11/27/2016 12:06    TELE: a-fib with ventricular rate 70-100  TTE: 11/28/2016  - Left ventricle: The cavity size was normal. There was mild   concentric hypertrophy. Systolic function was normal. The   estimated ejection fraction was in the range of 55% to 60%. Wall   motion was normal; there were no regional wall motion   abnormalities. The study was not technically sufficient to allow   evaluation of LV diastolic dysfunction due to atrial   fibrillation. - Aortic valve: There was mild to moderate regurgitation. - Mitral valve: There is thickening of both leaflets with bileaflet   prolapse posterior > anterior. There is centrally directed jet of   at least moderate mitral regurgitation. - Left atrium: The atrium was severely dilated. - Right atrium: The atrium was moderately dilated. - Tricuspid valve: There was severe regurgitation. - Pulmonary arteries: Systolic pressure was severely increased. PA   peak pressure: 67 mm Hg (S). - Inferior vena cava: The vessel was dilated. The respirophasic   diameter changes were blunted (< 50%), consistent with elevated   central venous pressure.   ASSESSMENT AND PLAN  New onset a-fib with RVR CAP Acute CHF  ATB per primary team. On Lasix yesterday, diuresed 2.4 L, now appears euvolemic, I would hold.   Continue Eliquis.  Switch Cardizem drip to Cardizem SR120 mg BID.  I have personally reviewed her echocardiogram, she has normal LVEF, with MVP and at least moderate MR with severely dilated left atrium and severe pulmonary hypertesion RVSP 68 mmHg.  Her chance of cardioverting is low, eventhough she wasn't in a-fib in October, we will  focus on rate control.  Potassium 3.8 after replacement yesterday. Fioricet for headaches - has caffeine, but no aspirin.  Start physical therapy to mobilize.   Signed, Ena Dawley MD, [facility] 11/30/2016

## 2016-11-30 NOTE — Progress Notes (Signed)
Pharmacy Antibiotic Note  [name] is a 81 y.o. female admitted on 11/27/2016 with pneumonia.  Pharmacy has been consulted for cefepime and vancomycin dosing, then narrowed to cefepime alone 1/18.  Plan: Cefepime 1g IV q24h Monitor culture data, renal function and clinical course   Height: 4\' 11"  (149.9 cm) Weight: 116 lb 1.6 oz (52.7 kg) IBW/kg (Calculated) : 43.2  Temp (24hrs), Avg:97.8 F (36.6 C), Min:97.6 F (36.4 C), Max:98.3 F (36.8 C)   Recent Labs Lab 11/27/16 1115 11/28/16 0236 11/29/16 0239 11/29/16 1101 11/30/16 0303  WBC 14.2* 9.2 9.9 11.4* 8.9  CREATININE 0.91 0.69 0.69  --  0.83    Estimated Creatinine Clearance: 41.4 mL/min (by C-G formula based on SCr of 0.83 mg/dL).    Allergies  Allergen Reactions  . Contrast Media [Iodinated Diagnostic Agents] Shortness Of Breath  . Iohexol Shortness Of Breath  . Penicillins Rash    Has patient had a PCN reaction causing immediate rash, facial/tongue/throat swelling, SOB or lightheadedness with hypotension: No Has patient had a PCN reaction causing severe rash involving mucus membranes or skin necrosis: No Has patient had a PCN reaction that required hospitalization No Has patient had a PCN reaction occurring within the last 10 years: No If all of the above answers are "NO", then may proceed with Cephalosporin use.    Antimicrobials this admission:  1/16 vanc>>1/18 1/16 cefepime>>  Dose adjustments this admission:  Microbiology results:  1/16 BCx: ngtd 1/16 Flu A: neg 1/17 MRSA PCR: neg  Uvaldo Rising, BCPS  Clinical Pharmacist Pager [phone]  11/30/2016 10:45 AM

## 2016-11-30 NOTE — Care Management Note (Signed)
Case Management Note  Patient Details  Name: [name] MRN: [id number] Date of Birth: [birth year]  Subjective/Objective:     Adm w at fib               Action/Plan:plans to return home w hhc   Expected Discharge Date:                  Expected Discharge Plan:  [place]  In-House Referral:     Discharge planning Services  CM Consult, Medication Assistance  Post Acute Care Choice:  Home Health Choice offered to:  Patient  DME Arranged:    DME Agency:     HH Arranged:  PT Sun:  [place]  Status of Service:  In process, will continue to follow  If discussed at Long Length of Stay Meetings, dates discussed:    Additional Comments: gave pt 30day free eliquis card. copay is 47.00 per month thereafter. Went over [employer] and pt uses [facility]. phy there rec hhpt at [place]. Ref to donna w [facility] for hhpt.  Lacretia Leigh, RN 11/30/2016, 9:22 AM

## 2016-11-30 NOTE — Progress Notes (Signed)
Physical Therapy Treatment Patient Details Name: [name] MRN: [id number] DOB: Aug 25, 1936 Today's Date: 11/30/2016    History of Present Illness Pt adm with afib with rvr and pna. PMH - osteoporosis, htn, breast ca, acdf, multiple vertebral fx's    PT Comments    Pt remains weak with little functional activity tolerance. Has to stop every 10' to catch her breath. Pt continues to refuse SNF. Reports pain in rt flank/back that she thinks is from pulling herself up in bed using the bed rail.  Follow Up Recommendations  Home health PT;Supervision for mobility/OOB (pt refuses SNF)     Equipment Recommendations  None recommended by PT    Recommendations for Other Services       Precautions / Restrictions Precautions Precautions: Fall Restrictions Weight Bearing Restrictions: No    Mobility  Bed Mobility Overal bed mobility: Needs Assistance Bed Mobility: Supine to Sit;Sit to Supine     Supine to sit: Min guard;HOB elevated     General bed mobility comments: for lines. Incr time  Transfers Overall transfer level: Needs assistance Equipment used: Rolling walker (2 wheeled) Transfers: Sit to/from Stand Sit to Stand: Min assist         General transfer comment: Assist to bring hips up and for balance  Ambulation/Gait Ambulation/Gait assistance: Min guard Ambulation Distance (Feet): 70 Feet Assistive device: Rolling walker (2 wheeled) Gait Pattern/deviations: Step-through pattern;Decreased step length - right;Decreased step length - left;Shuffle;Trunk flexed Gait velocity: decr   General Gait Details: Assist for safety. Pt stopping ~every 10' to catch her breath. Dyspnea 3/4 on RA with SpO2 >94%. HR 100's to 128.    Stairs            Wheelchair Mobility    Modified Rankin (Stroke Patients Only)       Balance Overall balance assessment: Needs assistance Sitting-balance support: No upper extremity supported;Feet supported Sitting balance-Leahy  Scale: Good     Standing balance support: Bilateral upper extremity supported Standing balance-Leahy Scale: Poor Standing balance comment: walker and supervision for static standing                    Cognition Arousal/Alertness: Awake/alert Behavior During Therapy: WFL for tasks assessed/performed Overall Cognitive Status: Within Functional Limits for tasks assessed                      Exercises      General Comments        Pertinent Vitals/Pain Pain Assessment: 0-10 Pain Score: 4  Pain Location: rt flank/back Pain Descriptors / Indicators: Aching Pain Intervention(s): Limited activity within patient's tolerance;Monitored during session    Home Living                      Prior Function            PT Goals (current goals can now be found in the care plan section) Progress towards PT goals: Progressing toward goals    Frequency    Min 3X/week      PT Plan Current plan remains appropriate    Co-evaluation             End of Session   Activity Tolerance: Patient limited by fatigue Patient left: with call bell/phone within reach;in chair     Time: 1457-1525 PT Time Calculation (min) (ACUTE ONLY): 28 min  Charges:  $Gait Training: 23-37 mins  G CodesShary Decamp [place]cok 11/30/2016, 3:29 PM [employer] PT [phone]

## 2016-12-01 DIAGNOSIS — I051 Rheumatic mitral insufficiency: Secondary | ICD-10-CM

## 2016-12-01 DIAGNOSIS — E440 Moderate protein-calorie malnutrition: Secondary | ICD-10-CM | POA: Insufficient documentation

## 2016-12-01 DIAGNOSIS — E038 Other specified hypothyroidism: Secondary | ICD-10-CM

## 2016-12-01 DIAGNOSIS — R0609 Other forms of dyspnea: Secondary | ICD-10-CM

## 2016-12-01 DIAGNOSIS — Z86718 Personal history of other venous thrombosis and embolism: Secondary | ICD-10-CM

## 2016-12-01 LAB — CBC
HEMATOCRIT: 35.9 % — AB (ref 36.0–46.0)
HEMOGLOBIN: 12.2 g/dL (ref 12.0–15.0)
MCH: 28.4 pg (ref 26.0–34.0)
MCHC: 34 g/dL (ref 30.0–36.0)
MCV: 83.5 fL (ref 78.0–100.0)
Platelets: 256 10*3/uL (ref 150–400)
RBC: 4.3 MIL/uL (ref 3.87–5.11)
RDW: 14.2 % (ref 11.5–15.5)
WBC: 10.5 10*3/uL (ref 4.0–10.5)

## 2016-12-01 LAB — BASIC METABOLIC PANEL
Anion gap: 8 (ref 5–15)
BUN: 20 mg/dL (ref 6–20)
CHLORIDE: 99 mmol/L — AB (ref 101–111)
CO2: 26 mmol/L (ref 22–32)
CREATININE: 0.59 mg/dL (ref 0.44–1.00)
Calcium: 8.3 mg/dL — ABNORMAL LOW (ref 8.9–10.3)
GFR calc Af Amer: >60 mL/min (ref >60)
GFR calc non Af Amer: >60 mL/min (ref >60)
Glucose, Bld: 96 mg/dL (ref 65–99)
POTASSIUM: 3.9 mmol/L (ref 3.5–5.1)
SODIUM: 133 mmol/L — AB (ref 135–145)

## 2016-12-01 MED ORDER — BISACODYL 10 MG RE SUPP
10.0000 mg | Freq: Every day | RECTAL | Status: DC | PRN
  Administered 2016-12-01: 10 mg via RECTAL
  Filled 2016-12-01: qty 1

## 2016-12-01 MED ORDER — DILTIAZEM HCL ER COATED BEADS 120 MG PO CP24
300.0000 mg | ORAL_CAPSULE | Freq: Every day | ORAL | Status: DC
  Administered 2016-12-01 – 2016-12-10 (×10): 300 mg via ORAL
  Filled 2016-12-01 (×11): qty 1

## 2016-12-01 MED ORDER — MAGNESIUM HYDROXIDE 400 MG/5ML PO SUSP
5.0000 mL | Freq: Every day | ORAL | Status: DC | PRN
  Administered 2016-12-01 (×2): 5 mL via ORAL
  Filled 2016-12-01 (×2): qty 30

## 2016-12-01 NOTE — Progress Notes (Signed)
Patient ID: [name], female   DOB: [birth year], 81 y.o.   MRN: [id number]  PROGRESS NOTE    JAKYRA OFFNER  [id number] DOB: 03/20/1936 DOA: 11/27/2016  PCP: [name], MD   Brief Narrative:  81 y.o. female with history of osteoporosis, dyslipidemia, hypertension, hypothyroidism, breast Ca (lumpx/ XRT 2012-3), history of DVT in 2012, anxiety. She presented to ED with worsening shortness of breath and fatigue for past 3 weeks prior to this admission. No reports of chest pan. On admission, she was in A fib with RVR. CXR showed pulmonary infiltrates, right ,ore than left concerning for pneumonia. Cardiology consulted for Afib with RVR. Pt started on IV Cardizem.   Transferred to telemetry 1/19.  Assessment & Plan:  Acute respiratory failure with hypoxia / Lobar pneumonia, right upper and lower lobe unspecified organism / Leukocytosis  - CXR 1/18 showed again noted asymmetric streaky airspace disease in right upper lobe and right lower lobe highly suspicious for pneumonia - Stopped vanco 1/18 and continued cefepime  - Blood cx negative  - Influenza negative  - WBC count WNL  Atrial fibrillation with RVR - CHADS vasc score 4 - Normal troponin level  - Continue anticoagulation with aspirin and eliquis  - Cardizem drip stopped and on Cardizem 120 mg Q 12 hours since 1/18 - Appreciate cardio following  Acute on chronic diastolic CHF - BNP on admission 443 - 2 D ECHO with preserved EF - Weight is 52.6 this am - Was on lasix 20 mg IV BID; cardio stopped lasix 1/19 - Continue daily weight and strict intake and output  Hypokalemia - Due to lasix - Supplemented and WNL  LLE DVT in 2012 - Continue AC with eliquis   GERD without esophagitis - Continue protonix   Anxiety  - Continue amitriptyline   Dyslipidemia - Continue Pravachol  Hypothyroidism - Continue synthroid  - TSH WNL on 11/27/16  Headache - Toradol and Fioricet   Moderate protein calorie  malnutrition - In the setting of chronic illness - Continue nutritional supplementation   DVT prophylaxis: On apixaban  Code Status: full code  Family Communication: No family at the bedside this am  Disposition Plan: transfer to telemetry floor today    Consultants:   Cardio PT  Procedures:   None   Antimicrobials:   Cefepime 11/27/16 -->  Vanco 11/27/16 --> 11/29/2016    Subjective: No overnight events.   Objective: Vitals:   11/30/16 1224 11/30/16 1709 11/30/16 2009 12/01/16 0444  BP:  98/68 109/77 103/75  Pulse:  78 85 95  Resp:   17 17  Temp: 97.5 F (36.4 C) 98.3 F (36.8 C) 98 F (36.7 C) 98 F (36.7 C)  TempSrc: Oral Oral Oral Oral  SpO2:  98% 97% 95%  Weight:    52.6 kg (115 lb 15.4 oz)  Height:        Intake/Output Summary (Last 24 hours) at 12/01/16 0924 Last data filed at 11/30/16 1300  Gross per 24 hour  Intake                0 ml  Output              350 ml  Net             -350 ml   Filed Weights   11/29/16 0300 11/30/16 0300 12/01/16 0444  Weight: 52 kg (114 lb 10.2 oz) 52.7 kg (116 lb 1.6 oz) 52.6 kg (115 lb 15.4 oz)  Examination:  General exam: No distress, calm and comfortable  Respiratory system: no wheezing, no rhonchi  Cardiovascular system: S1 & S2 heard, tachycardic, 4/6 systolic murmur  Gastrointestinal system: (+) BS, non tender, non distended   Central nervous system: No focal deficits  Extremities: No LE edema, palpable pulses bilaterally  Skin: no lesions or ulcers  Psychiatry: Normal mood and behavior  Data Reviewed: I have personally reviewed following labs and imaging studies  CBC:  Recent Labs Lab 11/28/16 0236 11/29/16 0239 11/29/16 1101 11/30/16 0303 12/01/16 0226  WBC 9.2 9.9 11.4* 8.9 10.5  HGB 11.9* 11.8* 12.2 11.7* 12.2  HCT 34.9* 34.6* 35.6* 34.1* 35.9*  MCV 84.1 84.0 83.2 83.2 83.5  PLT 226 225 234 235 [id number]   Basic Metabolic Panel:  Recent Labs Lab 11/27/16 1115 11/28/16 0236  11/29/16 0239 11/30/16 0303 12/01/16 0226  NA 133* 133* 134* 133* 133*  K 3.8 3.2* 3.8 3.5 3.9  CL 98* 102 101 101 99*  CO2 22 22 25 25 26   GLUCOSE 130* 131* 111* 100* 96  BUN 29* 23* 16 20 20   CREATININE 0.91 0.69 0.69 0.83 0.59  CALCIUM 9.1 8.6* 8.5* 8.3* 8.3*  MG  --   --  1.9  --   --    GFR: Estimated Creatinine Clearance: 43 mL/min (by C-G formula based on SCr of 0.59 mg/dL). Liver Function Tests: No results for input(s): AST, ALT, ALKPHOS, BILITOT, PROT, ALBUMIN in the last 168 hours. No results for input(s): LIPASE, AMYLASE in the last 168 hours. No results for input(s): AMMONIA in the last 168 hours. Coagulation Profile:  Recent Labs Lab 11/27/16 1115  INR 1.15   Cardiac Enzymes:  Recent Labs Lab 11/27/16 1824  TROPONINI <0.03   BNP (last 3 results) No results for input(s): PROBNP in the last 8760 hours. HbA1C: No results for input(s): HGBA1C in the last 72 hours. CBG: No results for input(s): GLUCAP in the last 168 hours. Lipid Profile: No results for input(s): CHOL, HDL, LDLCALC, TRIG, CHOLHDL, LDLDIRECT in the last 72 hours. Thyroid Function Tests: No results for input(s): TSH, T4TOTAL, FREET4, T3FREE, THYROIDAB in the last 72 hours. Anemia Panel: No results for input(s): VITAMINB12, FOLATE, FERRITIN, TIBC, IRON, RETICCTPCT in the last 72 hours. Urine analysis:    Component Value Date/Time   COLORURINE YELLOW 09/04/2015 1404   APPEARANCEUR CLEAR 09/04/2015 1404   LABSPEC 1.036 (H) 09/04/2015 1404   PHURINE 7.0 09/04/2015 1404   GLUCOSEU NEGATIVE 09/04/2015 1404   HGBUR NEGATIVE 09/04/2015 1404   BILIRUBINUR NEGATIVE 09/04/2015 1404   KETONESUR 15 (A) 09/04/2015 1404   PROTEINUR NEGATIVE 09/04/2015 1404   UROBILINOGEN 0.2 09/04/2015 1404   NITRITE NEGATIVE 09/04/2015 1404   LEUKOCYTESUR NEGATIVE 09/04/2015 1404   Sepsis Labs: @LABRCNTIP (procalcitonin:4,lacticidven:4)   ) Recent Results (from the past 240 hour(s))  MRSA PCR Screening      Status: None   Collection Time: 11/28/16  6:25 AM  Result Value Ref Range Status   MRSA by PCR NEGATIVE NEGATIVE Final      Radiology Studies: Dg Chest Port 1 View Result Date: 11/27/2016 Right worse than left airspace disease most worrisome for pneumonia. Small bilateral pleural effusions. Cardiomegaly. Atherosclerosis.   Dg Chest Port 1 View Result Date: 11/29/2016 Again noted asymmetric streaky airspace disease in right upper lobe and right lower lobe highly suspicious for pneumonia. Small left pleural effusion with left basilar atelectasis or infiltrate. No pulmonary edema. Electronically Signed   By: Orlean Bradford.D.  On: 11/29/2016 12:43      Scheduled Meds: . amitriptyline  50 mg Oral QHS  . apixaban  5 mg Oral BID  . aspirin EC  81 mg Oral Daily  . ceFEPime (MAXIPIME) IV  1 g Intravenous Q24H  . cholecalciferol  1,000 Units Oral Daily  . darifenacin  7.5 mg Oral Daily  . diltiazem  120 mg Oral Q12H  . feeding supplement (ENSURE ENLIVE)  237 mL Oral BID BM  . hydrocortisone cream   Topical QHS  . levothyroxine  112 mcg Oral QAC breakfast  . multivitamin with minerals  1 tablet Oral Daily  . pantoprazole  40 mg Oral Daily  . pravastatin  20 mg Oral QPM  . senna-docusate  2 tablet Oral QHS  . sodium chloride flush  3 mL Intravenous Q12H   Continuous Infusions:    LOS: 4 days    Time spent: 25 minutes  Greater than 50% of the time spent on counseling and coordinating the care.   Leisa Lenz, MD [facility] Pager [phone]  If 7PM-7AM, please contact night-coverage [email or web address] Password [facility] 12/01/2016, 9:24 AM

## 2016-12-01 NOTE — Progress Notes (Signed)
Physical Therapy Treatment Patient Details Name: [name] MRN: [id number] DOB: Mar 13, 1936 Today's Date: 12/01/2016    History of Present Illness Pt adm with afib with rvr and pna. PMH - osteoporosis, htn, breast ca, acdf, multiple vertebral fx's    PT Comments    Patient making progress with mobility and gait.  Continues to be limited by DOE.  Able to ambulate 23' with RW and min guard assist, with standing rest breaks every 10'.  Follow Up Recommendations  Home health PT;Supervision for mobility/OOB (Refusing SNF)     Equipment Recommendations  None recommended by PT    Recommendations for Other Services       Precautions / Restrictions Precautions Precautions: Fall Restrictions Weight Bearing Restrictions: No    Mobility  Bed Mobility Overal bed mobility: Needs Assistance Bed Mobility: Supine to Sit;Sit to Supine     Supine to sit: Min guard;HOB elevated Sit to supine: Min guard   General bed mobility comments: for lines. Incr time  Transfers Overall transfer level: Needs assistance Equipment used: Rolling walker (2 wheeled) Transfers: Sit to/from Stand Sit to Stand: Min guard         General transfer comment: Verbal cues for hand placement.  Assist for balance/safety during transfer.  Ambulation/Gait Ambulation/Gait assistance: Min guard Ambulation Distance (Feet): 80 Feet Assistive device: Rolling walker (2 wheeled) Gait Pattern/deviations: Step-through pattern;Decreased step length - right;Decreased step length - left;Shuffle;Trunk flexed Gait velocity: decr Gait velocity interpretation: Below normal speed for age/gender General Gait Details: Patient with DOE requiring her to stop frequently to catch her breath.  Dyspnea 3/4 - 4/4.   Stairs            Wheelchair Mobility    Modified Rankin (Stroke Patients Only)       Balance           Standing balance support: Bilateral upper extremity supported Standing balance-Leahy  Scale: Poor Standing balance comment: walker and supervision for static standing                    Cognition Arousal/Alertness: Awake/alert Behavior During Therapy: WFL for tasks assessed/performed;Anxious Overall Cognitive Status: Within Functional Limits for tasks assessed                      Exercises      General Comments        Pertinent Vitals/Pain Pain Assessment: Faces Faces Pain Scale: Hurts a little bit Pain Location: rt flank/back Pain Descriptors / Indicators: Aching Pain Intervention(s): Monitored during session;Repositioned    Home Living                      Prior Function            PT Goals (current goals can now be found in the care plan section) Acute Rehab PT Goals Patient Stated Goal: return home Progress towards PT goals: Progressing toward goals    Frequency    Min 3X/week      PT Plan Current plan remains appropriate    Co-evaluation             End of Session Equipment Utilized During Treatment: Gait belt Activity Tolerance: Patient limited by fatigue (Limited by DOE) Patient left: in bed;with call bell/phone within reach;with bed alarm set     Time: [id number] PT Time Calculation (min) (ACUTE ONLY): 13 min  Charges:  $Gait Training: 8-22 mins  G Codes:      Despina Pole 12/01/2016, 12:50 PM Carita Pian. Sanjuana Kava, [place] Pager [phone]

## 2016-12-01 NOTE — Progress Notes (Addendum)
Patient with complaints of constipation, patient given miralax as ordered for constipation, and also Paged [facility] through [facility] system to make aware. Will await call back or orders placed.  Sonita Michiels, Bettina Gavia RN  Orders were placed for Milk of Magnesia will monitor patient. Annastacia Duba, Bettina Gavia rN

## 2016-12-01 NOTE — Progress Notes (Signed)
Patient given dulcolax suppository as ordered as needed for constipation. Brewer Hitchman, Bettina Gavia RN

## 2016-12-01 NOTE — Consult Note (Signed)
ELECTROPHYSIOLOGY CONSULT NOTE    Primary Care Physician: [name], MD Referring Physician:  Dr Charlies Silvers  Admit Date: 11/27/2016  Reason for consultation:  afib with RVR  [name] is a 81 y.o. female with a h/o newly discovered afib with RVR admitted with SOB.  She presented to PCP and was noted to have afib with RVR.  She has had cough and SOB for about 3 weeks and has been treated with antibiotics since admission.  She was evaluated by Dr Meda Coffee who plans rate control as the initiate treatment strategy.  She has been initiated on eliquis with plans to consider more elective cardioversion.  She remains SOB and fatigued.  Making very slow progress.  Echo reveals mitral valve prolapse with at least moderate MR, severe pulmonary hypertension, and severe LA enlargement.  Today, she denies symptoms of palpitations, chest pain, orthopnea, PND, lower extremity edema, dizziness, presyncope, syncope, or neurologic sequela. The patient is tolerating medications without difficulties and is otherwise without complaint today.   Past Medical History:  Diagnosis Date  . Aneurysm ([place])   . Anxiety   . Breast cancer ([place]) 08/20/2011   R breast DCIS, ER/PR +  . Cancer ([place]) 10/12   bx/right breast  . Cataract 3 and 10/92   bilateral  . DVT (deep venous thrombosis) ([place]) 08/2011   LL extremity   . Fibromyalgia   . Fracture lumbar vertebra-closed ([place])   . Fracture of thoracic vertebra, closed ([place])   . GERD (gastroesophageal reflux disease)   . H/O hiatal hernia   . History of blood clots   . History of radiation therapy 01/2012   R breast  . Hypercholesteremia   . Hypertension    DR Orinda Kenner  . Hypothyroidism   . Osteoporosis   . Osteoporosis   . Rib fractures   . Shortness of breath   . Thyroid disease    Past Surgical History:  Procedure Laterality Date  . ANTERIOR CERVICAL DECOMP/DISCECTOMY FUSION N/A 09/09/2015   Procedure: Anterior Cervical Decompression and  Fusion Cervical seven-Thorasic one ;  Surgeon: Erline Levine, MD;  Location: [facility];  Service: Neurosurgery;  Laterality: N/A;  . APPENDECTOMY  1940  . BREAST SURGERY    . CATARACT EXTRACTION  1992  . EYE SURGERY  1940, 1956  . HERNIA REPAIR  10/16/2006   [facility] - Dr Hassell Done  . KYPHOPLASTY N/A 01/26/2013   Procedure: KYPHOPLASTY;  Surgeon: Kristeen Miss, MD;  Location: [facility];  Service: Neurosurgery;  Laterality: N/A;  T11 and L1  . MASTECTOMY, PARTIAL  10/17/2011   Procedure: MASTECTOMY PARTIAL;  Surgeon: Haywood Lasso, MD;  Location: [facility];  Service: General;  Laterality: Right;  needle guided  . TONSILLECTOMY  1944    . amitriptyline  50 mg Oral QHS  . apixaban  5 mg Oral BID  . ceFEPime (MAXIPIME) IV  1 g Intravenous Q24H  . cholecalciferol  1,000 Units Oral Daily  . darifenacin  7.5 mg Oral Daily  . diltiazem  300 mg Oral Daily  . feeding supplement (ENSURE ENLIVE)  237 mL Oral BID BM  . hydrocortisone cream   Topical QHS  . levothyroxine  112 mcg Oral QAC breakfast  . multivitamin with minerals  1 tablet Oral Daily  . pantoprazole  40 mg Oral Daily  . pravastatin  20 mg Oral QPM  . senna-docusate  2 tablet Oral QHS  . sodium chloride flush  3 mL Intravenous Q12H  Allergies  Allergen Reactions  . Contrast Media [Iodinated Diagnostic Agents] Shortness Of Breath  . Iohexol Shortness Of Breath  . Penicillins Rash    Has patient had a PCN reaction causing immediate rash, facial/tongue/throat swelling, SOB or lightheadedness with hypotension: No Has patient had a PCN reaction causing severe rash involving mucus membranes or skin necrosis: No Has patient had a PCN reaction that required hospitalization No Has patient had a PCN reaction occurring within the last 10 years: No If all of the above answers are "NO", then may proceed with Cephalosporin use.     Social History   Social History  . Marital status: Married    Spouse name: N/A  . Number of children:  N/A  . Years of education: N/A   Occupational History  . Not on file.   Social History Main Topics  . Smoking status: Never Smoker  . Smokeless tobacco: Never Used  . Alcohol use No  . Drug use: No  . Sexual activity: No   Other Topics Concern  . Not on file   Social History Narrative  . No narrative on file    Family History  Problem Relation Age of Onset  . Heart disease Mother   . Heart disease Maternal Uncle   . Heart disease Maternal Grandfather     ROS- All systems are reviewed and negative except as per the HPI above  Physical Exam: Telemetry: Vitals:   11/30/16 1709 11/30/16 2009 12/01/16 0444 12/01/16 0935  BP: 98/68 109/77 103/75 102/68  Pulse: 78 85 95 99  Resp:  17 17   Temp: 98.3 F (36.8 C) 98 F (36.7 C) 98 F (36.7 C)   TempSrc: Oral Oral Oral   SpO2: 98% 97% 95%   Weight:   115 lb 15.4 oz (52.6 kg)   Height:        GEN- The patient is elderly and thin appearing, alert and oriented x 3 today.   Head- normocephalic, atraumatic Eyes-  Sclera clear, conjunctiva pink Ears- hearing intact Oropharynx- clear Neck- supple,   Lungs- Clear to ausculation bilaterally, normal work of breathing Heart- irregular rate and rhythm, very minor MR murmur GI- soft, NT, ND, + BS Extremities- no clubbing, cyanosis, or edema MS-diffuse atrophy Skin- no rash or lesion Psych- euthymic mood, full affect Neuro- strength and sensation are intact  EKG 11/28/16 reveals afib with RVR  Labs:   Lab Results  Component Value Date   WBC 10.5 12/01/2016   HGB 12.2 12/01/2016   HCT 35.9 (L) 12/01/2016   MCV 83.5 12/01/2016   PLT 256 12/01/2016    Recent Labs Lab 12/01/16 0226  NA 133*  K 3.9  CL 99*  CO2 26  BUN 20  CREATININE 0.59  CALCIUM 8.3*  GLUCOSE 96   Lab Results  Component Value Date   CKTOTAL 103 12/01/2010   CKMB 3.2 12/01/2010   TROPONINI <0.03 11/27/2016    Lab Results  Component Value Date   CHOL  12/02/2010    149        ATP III  CLASSIFICATION:  <200     mg/dL   Desirable  200-239  mg/dL   Borderline High  >=240    mg/dL   High          CHOL  12/01/2010    179        ATP III CLASSIFICATION:  <200     mg/dL   Desirable  200-239  mg/dL   Borderline  High  >=240    mg/dL   High          Lab Results  Component Value Date   HDL 58 12/02/2010   HDL 66 12/01/2010   Lab Results  Component Value Date   LDLCALC  12/02/2010    80        Total Cholesterol/HDL:CHD Risk Coronary Heart Disease Risk Table                     Men   Women  1/2 Average Risk   3.4   3.3  Average Risk       5.0   4.4  2 X Average Risk   9.6   7.1  3 X Average Risk  23.4   11.0        Use the calculated Patient Ratio above and the CHD Risk Table to determine the patient's CHD Risk.        ATP III CLASSIFICATION (LDL):  <100     mg/dL   Optimal  100-129  mg/dL   Near or Above                    Optimal  130-159  mg/dL   Borderline  160-189  mg/dL   High  >190     mg/dL   Very High   LDLCALC (H) 12/01/2010    107        Total Cholesterol/HDL:CHD Risk Coronary Heart Disease Risk Table                     Men   Women  1/2 Average Risk   3.4   3.3  Average Risk       5.0   4.4  2 X Average Risk   9.6   7.1  3 X Average Risk  23.4   11.0        Use the calculated Patient Ratio above and the CHD Risk Table to determine the patient's CHD Risk.        ATP III CLASSIFICATION (LDL):  <100     mg/dL   Optimal  100-129  mg/dL   Near or Above                    Optimal  130-159  mg/dL   Borderline  160-189  mg/dL   High  >190     mg/dL   Very High   Lab Results  Component Value Date   TRIG 54 12/02/2010   TRIG 31 12/01/2010   Lab Results  Component Value Date   CHOLHDL 2.6 12/02/2010   CHOLHDL 2.7 12/01/2010   No results found for: LDLDIRECT    Radiology: reviewed CDR  Echo: reviewed  ASSESSMENT AND PLAN:   1. Persistent afib with RVR The patient appears to be quite symptomatic with her afib.  Hopefully, she will  improve with rate control.  I share Dr Francesca Oman concern that given atriopathy in the setting of pulmonary hypertension and mitral valve disease that our ability to maintain sinus rhythm is low.  She has chads2vasc score of at least 5 as well as prior DVT.  She has been appropriately anticoagulated since 11/27/16 with eliquis. Consider TEE cardioversion next week if she does not improve with rate control.  Would not initiate antiarrhythmic therapy at this time.  Increase diltiazem to 300mg  daily.  This can be further uptitrated if needed.  2. Mitral regurgitation She  has at least moderate MR with severe pulmonary hypertension, mitral leaflet abnormality, and severe LA enlargement.  She will need TEE at some point to further assess her MR.  Ultimately, she may require mitral valve repair with MAZE.  She is well known to Dr Prescott Gum.  3. HTN Stable No change required today   Thompson Grayer, MD 12/01/2016  11:09 AM

## 2016-12-01 NOTE — Progress Notes (Signed)
Spoke with Dr. Rayann Heman regarding patient medication this AM. Stated Ok to give new dose of Diltiazem as scheduled this AM. Will monitor patient. [name], Bettina Gavia RN

## 2016-12-01 NOTE — Progress Notes (Signed)
Patient appears very anxious about bowels not moving yet, patient has received Miralax and MOM today as ordered as needed for constipation. Dr. Charlies Silvers paged through [facility] system to make aware. Will await call back/ and or orders in epic. Will monitor patient. Nancylee Gaines, Bettina Gavia RN

## 2016-12-02 DIAGNOSIS — R0602 Shortness of breath: Secondary | ICD-10-CM

## 2016-12-02 LAB — CULTURE, BLOOD (ROUTINE X 2)
Culture: NO GROWTH
Culture: NO GROWTH

## 2016-12-02 LAB — CBC
HCT: 38 % (ref 36.0–46.0)
HEMOGLOBIN: 12.9 g/dL (ref 12.0–15.0)
MCH: 28.4 pg (ref 26.0–34.0)
MCHC: 33.9 g/dL (ref 30.0–36.0)
MCV: 83.7 fL (ref 78.0–100.0)
Platelets: 271 10*3/uL (ref 150–400)
RBC: 4.54 MIL/uL (ref 3.87–5.11)
RDW: 14 % (ref 11.5–15.5)
WBC: 9.2 10*3/uL (ref 4.0–10.5)

## 2016-12-02 LAB — BASIC METABOLIC PANEL
Anion gap: 6 (ref 5–15)
BUN: 16 mg/dL (ref 6–20)
CHLORIDE: 101 mmol/L (ref 101–111)
CO2: 29 mmol/L (ref 22–32)
CREATININE: 0.72 mg/dL (ref 0.44–1.00)
Calcium: 8.5 mg/dL — ABNORMAL LOW (ref 8.9–10.3)
Glucose, Bld: 103 mg/dL — ABNORMAL HIGH (ref 65–99)
POTASSIUM: 3.9 mmol/L (ref 3.5–5.1)
SODIUM: 136 mmol/L (ref 135–145)

## 2016-12-02 MED ORDER — FLECAINIDE ACETATE 50 MG PO TABS
50.0000 mg | ORAL_TABLET | Freq: Two times a day (BID) | ORAL | Status: DC
  Administered 2016-12-02 – 2016-12-10 (×17): 50 mg via ORAL
  Filled 2016-12-02 (×17): qty 1

## 2016-12-02 MED ORDER — MAGNESIUM HYDROXIDE 400 MG/5ML PO SUSP
15.0000 mL | Freq: Every day | ORAL | Status: DC | PRN
  Administered 2016-12-02 – 2016-12-09 (×3): 15 mL via ORAL
  Filled 2016-12-02 (×3): qty 30

## 2016-12-02 NOTE — Progress Notes (Signed)
Patient ID: [name], female   DOB: 1936-01-28, 81 y.o.   MRN: [id number]  PROGRESS NOTE    [name]  [id number] DOB: Aug 30, 1936 DOA: 11/27/2016  PCP: [name], MD   Brief Narrative:  81 y.o. female with history of osteoporosis, dyslipidemia, hypertension, hypothyroidism, breast Ca (lumpx/ XRT 2012-3), history of DVT in 2012, anxiety. She presented to ED with worsening shortness of breath and fatigue for past 3 weeks prior to this admission. No reports of chest pan. On admission, she was in A fib with RVR. CXR showed pulmonary infiltrates, right ,ore than left concerning for pneumonia. Cardiology consulted for Afib with RVR. Pt started on IV Cardizem.   Transferred to telemetry 1/19.  Assessment & Plan:  Acute respiratory failure with hypoxia / Lobar pneumonia, right upper and lower lobe unspecified organism / Leukocytosis  - Stable respiratory status  - CXR 1/18 showed asymmetric streaky airspace disease in right upper lobe and right lower lobe highly suspicious for pneumonia - Stopped vanco 1/18 and we continued cefepime  - Blood cx negative  - Influenza negative  - WBC count WNL  Atrial fibrillation with RVR - CHADS vasc score 4 - Normal troponin level  - Continue anticoagulation with aspirin and eliquis  - Cardizem drip stopped and on Cardizem 120 mg Q 12 hours since 1/18, changed to 300 mg daily per cardio today 1/21 - Cardio added flecainide 50 mg Q12 hours today  - Plan for TEE tomorrow to assess MR  Acute on chronic diastolic CHF - BNP on admission 443 - 2 D ECHO with preserved EF - Was on lasix 20 mg IV BID; cardio stopped lasix 1/19 - Continue daily weight and strict intake and output  Hypokalemia - Due to lasix - Supplemented and WNL  LLE DVT in 2012 - Continue AC with eliquis   GERD without esophagitis - Continue protonix   Anxiety  - Continue amitriptyline   Dyslipidemia - Continue Pravachol  Hypothyroidism - Continue  synthroid  - TSH WNL on 11/27/16  Headache - Toradol and Fioricet   Moderate protein calorie malnutrition - In the setting of chronic illness - Continue nutritional supplementation   DVT prophylaxis: On apixaban  Code Status: full code  Family Communication: No family at the bedside this am  Disposition Plan: plan for TEE tomorrow per cardio    Consultants:   Cardio PT  Procedures:   None   Antimicrobials:   Cefepime 11/27/16 -->  Vanco 11/27/16 --> 11/29/2016    Subjective: No overnight events.   Objective: Vitals:   12/01/16 1902 12/02/16 0215 12/02/16 0456 12/02/16 1000  BP: 112/70  108/63 (!) 97/59  Pulse: 83  76   Resp: 18  18   Temp: 98.2 F (36.8 C)  97.8 F (36.6 C)   TempSrc: Oral  Oral   SpO2: 97%  93%   Weight:  49.1 kg (108 lb 4.8 oz)    Height:        Intake/Output Summary (Last 24 hours) at 12/02/16 1311 Last data filed at 12/02/16 0830  Gross per 24 hour  Intake              480 ml  Output                0 ml  Net              480 ml   Filed Weights   11/30/16 0300 12/01/16 0444 12/02/16 0215  Weight: 52.7 kg (  116 lb 1.6 oz) 52.6 kg (115 lb 15.4 oz) 49.1 kg (108 lb 4.8 oz)    Examination:  General exam: No distress, calm and comfortable  Respiratory system: no wheezing, no rhonchi  Cardiovascular system: S1 & S2 (+), 4/6 systolic murmur  Gastrointestinal system: (+) BS, non tender, non distended   Central nervous system: Nonfocal   Extremities: No LE edema, palpable pulses bilaterally  Skin: skin is warm and dry  Psychiatry: Normal mood and behavior, no agitation or restlessness   Data Reviewed: I have personally reviewed following labs and imaging studies  CBC:  Recent Labs Lab 11/29/16 0239 11/29/16 1101 11/30/16 0303 12/01/16 0226 12/02/16 0234  WBC 9.9 11.4* 8.9 10.5 9.2  HGB 11.8* 12.2 11.7* 12.2 12.9  HCT 34.6* 35.6* 34.1* 35.9* 38.0  MCV 84.0 83.2 83.2 83.5 83.7  PLT 225 234 235 256 [id number]   Basic Metabolic  Panel:  Recent Labs Lab 11/28/16 0236 11/29/16 0239 11/30/16 0303 12/01/16 0226 12/02/16 0234  NA 133* 134* 133* 133* 136  K 3.2* 3.8 3.5 3.9 3.9  CL 102 101 101 99* 101  CO2 22 25 25 26 29   GLUCOSE 131* 111* 100* 96 103*  BUN 23* 16 20 20 16   CREATININE 0.69 0.69 0.83 0.59 0.72  CALCIUM 8.6* 8.5* 8.3* 8.3* 8.5*  MG  --  1.9  --   --   --    GFR: Estimated Creatinine Clearance: 39.5 mL/min (by C-G formula based on SCr of 0.72 mg/dL). Liver Function Tests: No results for input(s): AST, ALT, ALKPHOS, BILITOT, PROT, ALBUMIN in the last 168 hours. No results for input(s): LIPASE, AMYLASE in the last 168 hours. No results for input(s): AMMONIA in the last 168 hours. Coagulation Profile:  Recent Labs Lab 11/27/16 1115  INR 1.15   Cardiac Enzymes:  Recent Labs Lab 11/27/16 1824  TROPONINI <0.03   BNP (last 3 results) No results for input(s): PROBNP in the last 8760 hours. HbA1C: No results for input(s): HGBA1C in the last 72 hours. CBG: No results for input(s): GLUCAP in the last 168 hours. Lipid Profile: No results for input(s): CHOL, HDL, LDLCALC, TRIG, CHOLHDL, LDLDIRECT in the last 72 hours. Thyroid Function Tests: No results for input(s): TSH, T4TOTAL, FREET4, T3FREE, THYROIDAB in the last 72 hours. Anemia Panel: No results for input(s): VITAMINB12, FOLATE, FERRITIN, TIBC, IRON, RETICCTPCT in the last 72 hours. Urine analysis:    Component Value Date/Time   COLORURINE YELLOW 09/04/2015 1404   APPEARANCEUR CLEAR 09/04/2015 1404   LABSPEC 1.036 (H) 09/04/2015 1404   PHURINE 7.0 09/04/2015 1404   GLUCOSEU NEGATIVE 09/04/2015 1404   HGBUR NEGATIVE 09/04/2015 1404   BILIRUBINUR NEGATIVE 09/04/2015 1404   KETONESUR 15 (A) 09/04/2015 1404   PROTEINUR NEGATIVE 09/04/2015 1404   UROBILINOGEN 0.2 09/04/2015 1404   NITRITE NEGATIVE 09/04/2015 1404   LEUKOCYTESUR NEGATIVE 09/04/2015 1404   Sepsis Labs: @LABRCNTIP (procalcitonin:4,lacticidven:4)   Recent Results  (from the past 240 hour(s))  MRSA PCR Screening     Status: None   Collection Time: 11/28/16  6:25 AM  Result Value Ref Range Status   MRSA by PCR NEGATIVE NEGATIVE Final      Radiology Studies: Dg Chest Port 1 View Result Date: 11/27/2016 Right worse than left airspace disease most worrisome for pneumonia. Small bilateral pleural effusions. Cardiomegaly. Atherosclerosis.   Dg Chest Port 1 View Result Date: 11/29/2016 Again noted asymmetric streaky airspace disease in right upper lobe and right lower lobe highly suspicious for pneumonia. Small left  pleural effusion with left basilar atelectasis or infiltrate. No pulmonary edema.     Scheduled Meds: . amitriptyline  50 mg Oral QHS  . apixaban  5 mg Oral BID  . ceFEPime (MAXIPIME) IV  1 g Intravenous Q24H  . cholecalciferol  1,000 Units Oral Daily  . darifenacin  7.5 mg Oral Daily  . diltiazem  300 mg Oral Daily  . feeding supplement (ENSURE ENLIVE)  237 mL Oral BID BM  . flecainide  50 mg Oral Q12H  . hydrocortisone cream   Topical QHS  . levothyroxine  112 mcg Oral QAC breakfast  . multivitamin with minerals  1 tablet Oral Daily  . pantoprazole  40 mg Oral Daily  . pravastatin  20 mg Oral QPM  . senna-docusate  2 tablet Oral QHS  . sodium chloride flush  3 mL Intravenous Q12H   Continuous Infusions:    LOS: 5 days    Time spent: 15 minutes  Greater than 50% of the time spent on counseling and coordinating the care.   Leisa Lenz, MD [facility] Pager [phone]  If 7PM-7AM, please contact night-coverage [email or web address] Password [email or web address] 12/02/2016, 1:11 PM

## 2016-12-02 NOTE — Progress Notes (Signed)
SUBJECTIVE: The patient is doing well today.  She is "dizzy" this morning.  At this time, she denies chest pain, shortness of breath, or any new concerns.  Marland Kitchen amitriptyline  50 mg Oral QHS  . apixaban  5 mg Oral BID  . ceFEPime (MAXIPIME) IV  1 g Intravenous Q24H  . cholecalciferol  1,000 Units Oral Daily  . darifenacin  7.5 mg Oral Daily  . diltiazem  300 mg Oral Daily  . feeding supplement (ENSURE ENLIVE)  237 mL Oral BID BM  . flecainide  50 mg Oral Q12H  . hydrocortisone cream   Topical QHS  . levothyroxine  112 mcg Oral QAC breakfast  . multivitamin with minerals  1 tablet Oral Daily  . pantoprazole  40 mg Oral Daily  . pravastatin  20 mg Oral QPM  . senna-docusate  2 tablet Oral QHS  . sodium chloride flush  3 mL Intravenous Q12H     OBJECTIVE: Physical Exam: Vitals:   12/01/16 1902 12/02/16 0215 12/02/16 0456 12/02/16 1000  BP: 112/70  108/63 (!) 97/59  Pulse: 83  76   Resp: 18  18   Temp: 98.2 F (36.8 C)  97.8 F (36.6 C)   TempSrc: Oral  Oral   SpO2: 97%  93%   Weight:  108 lb 4.8 oz (49.1 kg)    Height:        Intake/Output Summary (Last 24 hours) at 12/02/16 1010 Last data filed at 12/02/16 0830  Gross per 24 hour  Intake              480 ml  Output                0 ml  Net              480 ml    Telemetry reveals that she has converted to sinus rhythm overnight  GEN- The patient is thin and elderly appearing, alert and oriented x 3 today.   Head- normocephalic, atraumatic Eyes-  Sclera clear, conjunctiva pink Ears- hearing intact Oropharynx- clear Neck- supple,  Lungs- Clear to ausculation bilaterally, normal work of breathing Heart- Regular rate and rhythm, 1/6 SEM at the apex GI- soft, NT, ND, + BS Extremities- no clubbing, cyanosis, +1 edema Skin- no rash or lesion Psych- euthymic mood, full affect Neuro- strength and sensation are intact, FNF/ rhomberg normal  LABS: Basic Metabolic Panel:  Recent Labs  12/01/16 0226 12/02/16 0234    NA 133* 136  K 3.9 3.9  CL 99* 101  CO2 26 29  GLUCOSE 96 103*  BUN 20 16  CREATININE 0.59 0.72  CALCIUM 8.3* 8.5*   Liver Function Tests: No results for input(s): AST, ALT, ALKPHOS, BILITOT, PROT, ALBUMIN in the last 72 hours. No results for input(s): LIPASE, AMYLASE in the last 72 hours. CBC:  Recent Labs  12/01/16 0226 12/02/16 0234  WBC 10.5 9.2  HGB 12.2 12.9  HCT 35.9* 38.0  MCV 83.5 83.7  PLT 256 271   RADIOLOGY: Dg Chest Port 1 View  Result Date: 11/29/2016 CLINICAL DATA:  Cough, pneumonia EXAM: PORTABLE CHEST 1 VIEW COMPARISON:  11/27/2016 FINDINGS: Again noted asymmetric streaky airspace disease in right upper lobe and right lower lobe highly suspicious for pneumonia. Small left pleural effusion with left basilar atelectasis or infiltrate. Atherosclerotic calcifications of thoracic aorta. No convincing pulmonary edema. IMPRESSION: Again noted asymmetric streaky airspace disease in right upper lobe and right lower lobe highly suspicious for pneumonia. Small  left pleural effusion with left basilar atelectasis or infiltrate. No pulmonary edema. Electronically Signed   By: Lahoma Crocker M.D.   On: 11/29/2016 12:43   Dg Chest Port 1 View  Result Date: 11/27/2016 CLINICAL DATA:  Shortness of breath and cough for 3 weeks. EXAM: PORTABLE CHEST 1 VIEW COMPARISON:  Single-view of the chest 09/10/2015. FINDINGS: Airspace disease is seen in the right upper and lower lung zones and left lung base. There is cardiomegaly. Aortic atherosclerosis is noted. No pneumothorax. There appear to be small bilateral pleural effusions. IMPRESSION: Right worse than left airspace disease most worrisome for pneumonia. Small bilateral pleural effusions. Cardiomegaly. Atherosclerosis. Electronically Signed   By: Inge Rise M.D.   On: 11/27/2016 12:06    ASSESSMENT AND PLAN:   1. Persistent afib with RVR The patient appears to be quite symptomatic with her afib. Fortunately, she has spontaneously  converted to sinus rhythm. Given normal EF, no significant LVH, and no known CAD, will start flecainide 50mg  BID at this time. Continue long term anticoagulation with eliquis. Continue diltiazem.  2. Mitral regurgitation She has at least moderate MR with severe pulmonary hypertension, mitral leaflet abnormality, and severe LA enlargement.  She will need TEE at some point to further assess her MR.  She wishes to have this performed while in the hospital.  I will therefore make NPO for tentative TEE to assess her MR tomorrow.   3. HTN Stable No change required today  Could be discharged after TEE if other medical issues are stable. Follow-up with Dr Prescott Gum later this week as previously scheduled to assess valvular heart disease. Follow-up in AF clinic in 3-5 days post discharge  Thompson Grayer, MD 12/02/2016 10:10 AM

## 2016-12-02 NOTE — Progress Notes (Signed)
Patient sat up in chair for one hour and then requested to go back to bed.  Cyndia Bent RN

## 2016-12-02 NOTE — Progress Notes (Signed)
Patient tearful about TEE tomorrow. Explained procedure to patient and told her to take some time to think about it prior to signing consent. Cyndia Bent RN

## 2016-12-02 NOTE — Progress Notes (Signed)
Patient consent for TEE signed.  Cyndia Bent RN

## 2016-12-02 NOTE — Progress Notes (Signed)
Pt request milk of mag fo constipation . She refused ger elavil and ambien. Pt very agitated

## 2016-12-03 DIAGNOSIS — R0600 Dyspnea, unspecified: Secondary | ICD-10-CM

## 2016-12-03 LAB — CBC
HCT: 36.6 % (ref 36.0–46.0)
Hemoglobin: 12.6 g/dL (ref 12.0–15.0)
MCH: 29 pg (ref 26.0–34.0)
MCHC: 34.4 g/dL (ref 30.0–36.0)
MCV: 84.1 fL (ref 78.0–100.0)
Platelets: 285 10*3/uL (ref 150–400)
RBC: 4.35 MIL/uL (ref 3.87–5.11)
RDW: 13.8 % (ref 11.5–15.5)
WBC: 9.2 10*3/uL (ref 4.0–10.5)

## 2016-12-03 LAB — BASIC METABOLIC PANEL
Anion gap: 8 (ref 5–15)
BUN: 14 mg/dL (ref 6–20)
CO2: 27 mmol/L (ref 22–32)
CREATININE: 0.65 mg/dL (ref 0.44–1.00)
Calcium: 8.4 mg/dL — ABNORMAL LOW (ref 8.9–10.3)
Chloride: 99 mmol/L — ABNORMAL LOW (ref 101–111)
Glucose, Bld: 93 mg/dL (ref 65–99)
POTASSIUM: 4.2 mmol/L (ref 3.5–5.1)
SODIUM: 134 mmol/L — AB (ref 135–145)

## 2016-12-03 NOTE — Care Management Important Message (Signed)
Important Message  Patient Details  Name: [name] MRN: [id number] Date of Birth: 11/04/1936   Medicare Important Message Given:  Yes    [name] May 12/03/2016, 1:36 PM

## 2016-12-03 NOTE — Progress Notes (Signed)
Patient ID: [name], female   DOB: [birth year], 81 y.o.   MRN: [id number]  PROGRESS NOTE    [name]  [id number] DOB: 1936-03-31 DOA: 11/27/2016  PCP: [name], MD   Brief Narrative:  81 y.o. female with history of osteoporosis, dyslipidemia, hypertension, hypothyroidism, breast Ca (lumpx/ XRT 2012-3), history of DVT in 2012, anxiety. She presented to ED with worsening shortness of breath and fatigue for past 3 weeks prior to this admission. No reports of chest pan. On admission, she was in A fib with RVR. CXR showed pulmonary infiltrates, right ,ore than left concerning for pneumonia. Cardiology consulted for Afib with RVR. Pt started on IV Cardizem.   Transferred to telemetry 1/19.  Assessment & Plan:  Acute respiratory failure with hypoxia / Lobar pneumonia, right upper and lower lobe unspecified organism / Leukocytosis  - Stable respiratory status  - CXR 1/18 showed asymmetric streaky airspace disease in right upper lobe and right lower lobe highly suspicious for pneumonia - Stopped vanco 1/18 and continued cefepime  - Will repeat CXR in next 1-2 days to assess resolution of pneumonia  - Blood cx negative  - Influenza negative   Atrial fibrillation with RVR - CHADS vasc score 4 - Troponin was WNL - Continue aspirin and eliquis  - Cardizem drip stopped and started on Cardizem 120 mg Q 12 hours on 1/18, changed to 300 mg daily per cardio 1/21 - Cardio added flecainide 50 mg Q12 hours started 1/21 - Plan for TEE tomorrow to assess MR; was not scheduled for today   Acute on chronic diastolic CHF - BNP on admission 443 - 2 D ECHO with preserved EF - Was on lasix 20 mg IV BID; cardio stopped lasix 1/19 - Continue daily weight and strict intake and output  Hypokalemia - Due to lasix - Supplemented and WNL  LLE DVT in 2012 - Continue AC with eliquis   GERD without esophagitis - Continue protonix   Anxiety  - Continue amitriptyline    Dyslipidemia - Continue Pravachol  Hypothyroidism - Continue synthroid  - TSH WNL on 11/27/16  Headache - Toradol and Fioricet   Moderate protein calorie malnutrition - In the setting of chronic illness - Continue nutritional supplementation   DVT prophylaxis: On apixaban  Code Status: full code  Family Communication: No family at the bedside this am; called pt spouse and left VM with my cell numebr to call me back for updates  Disposition Plan: plan for TEE tomorrow per cardio    Consultants:   Cardio PT  Procedures:   None   Antimicrobials:   Cefepime 11/27/16 -->  Vanco 11/27/16 --> 11/29/2016    Subjective: No overnight events.   Objective: Vitals:   12/02/16 1901 12/03/16 0600 12/03/16 0608 12/03/16 1020  BP: 111/73 118/66  132/71  Pulse: 73 74    Resp: 18 18    Temp: 98.6 F (37 C) 97.8 F (36.6 C)    TempSrc: Oral Oral    SpO2: 97% 97%    Weight:   48.7 kg (107 lb 5.8 oz)   Height:        Intake/Output Summary (Last 24 hours) at 12/03/16 1154 Last data filed at 12/03/16 1000  Gross per 24 hour  Intake              600 ml  Output                0 ml  Net  600 ml   Filed Weights   12/01/16 0444 12/02/16 0215 12/03/16 0608  Weight: 52.6 kg (115 lb 15.4 oz) 49.1 kg (108 lb 4.8 oz) 48.7 kg (107 lb 5.8 oz)    Examination:  General exam: No distress, calm and comfortable  Respiratory system: no wheezing, no rhonchi  Cardiovascular system: S1 & S2 (+), 4/6 systolic murmur  Gastrointestinal system: (+) BS, non tender, non distended   Central nervous system: Nonfocal   Extremities: No LE edema, palpable pulses bilaterally  Skin: skin is warm and dry  Psychiatry: Normal mood and behavior, no agitation or restlessness   Data Reviewed: I have personally reviewed following labs and imaging studies  CBC:  Recent Labs Lab 11/29/16 1101 11/30/16 0303 12/01/16 0226 12/02/16 0234 12/03/16 0246  WBC 11.4* 8.9 10.5 9.2 9.2  HGB  12.2 11.7* 12.2 12.9 12.6  HCT 35.6* 34.1* 35.9* 38.0 36.6  MCV 83.2 83.2 83.5 83.7 84.1  PLT 234 235 256 271 [id number]   Basic Metabolic Panel:  Recent Labs Lab 11/29/16 0239 11/30/16 0303 12/01/16 0226 12/02/16 0234 12/03/16 0246  NA 134* 133* 133* 136 134*  K 3.8 3.5 3.9 3.9 4.2  CL 101 101 99* 101 99*  CO2 25 25 26 29 27   GLUCOSE 111* 100* 96 103* 93  BUN 16 20 20 16 14   CREATININE 0.69 0.83 0.59 0.72 0.65  CALCIUM 8.5* 8.3* 8.3* 8.5* 8.4*  MG 1.9  --   --   --   --    GFR: Estimated Creatinine Clearance: 39.5 mL/min (by C-G formula based on SCr of 0.65 mg/dL). Liver Function Tests: No results for input(s): AST, ALT, ALKPHOS, BILITOT, PROT, ALBUMIN in the last 168 hours. No results for input(s): LIPASE, AMYLASE in the last 168 hours. No results for input(s): AMMONIA in the last 168 hours. Coagulation Profile:  Recent Labs Lab 11/27/16 1115  INR 1.15   Cardiac Enzymes:  Recent Labs Lab 11/27/16 1824  TROPONINI <0.03   BNP (last 3 results) No results for input(s): PROBNP in the last 8760 hours. HbA1C: No results for input(s): HGBA1C in the last 72 hours. CBG: No results for input(s): GLUCAP in the last 168 hours. Lipid Profile: No results for input(s): CHOL, HDL, LDLCALC, TRIG, CHOLHDL, LDLDIRECT in the last 72 hours. Thyroid Function Tests: No results for input(s): TSH, T4TOTAL, FREET4, T3FREE, THYROIDAB in the last 72 hours. Anemia Panel: No results for input(s): VITAMINB12, FOLATE, FERRITIN, TIBC, IRON, RETICCTPCT in the last 72 hours. Urine analysis:    Component Value Date/Time   COLORURINE YELLOW 09/04/2015 1404   APPEARANCEUR CLEAR 09/04/2015 1404   LABSPEC 1.036 (H) 09/04/2015 1404   PHURINE 7.0 09/04/2015 1404   GLUCOSEU NEGATIVE 09/04/2015 1404   HGBUR NEGATIVE 09/04/2015 1404   BILIRUBINUR NEGATIVE 09/04/2015 1404   KETONESUR 15 (A) 09/04/2015 1404   PROTEINUR NEGATIVE 09/04/2015 1404   UROBILINOGEN 0.2 09/04/2015 1404   NITRITE NEGATIVE  09/04/2015 1404   LEUKOCYTESUR NEGATIVE 09/04/2015 1404   Sepsis Labs: @LABRCNTIP (procalcitonin:4,lacticidven:4)   Recent Results (from the past 240 hour(s))  MRSA PCR Screening     Status: None   Collection Time: 11/28/16  6:25 AM  Result Value Ref Range Status   MRSA by PCR NEGATIVE NEGATIVE Final      Radiology Studies: Dg Chest Port 1 View Result Date: 11/27/2016 Right worse than left airspace disease most worrisome for pneumonia. Small bilateral pleural effusions. Cardiomegaly. Atherosclerosis.   Dg Chest Port 1 View Result Date: 11/29/2016 Again noted  asymmetric streaky airspace disease in right upper lobe and right lower lobe highly suspicious for pneumonia. Small left pleural effusion with left basilar atelectasis or infiltrate. No pulmonary edema.     Scheduled Meds: . amitriptyline  50 mg Oral QHS  . apixaban  5 mg Oral BID  . ceFEPime (MAXIPIME) IV  1 g Intravenous Q24H  . cholecalciferol  1,000 Units Oral Daily  . darifenacin  7.5 mg Oral Daily  . diltiazem  300 mg Oral Daily  . feeding supplement (ENSURE ENLIVE)  237 mL Oral BID BM  . flecainide  50 mg Oral Q12H  . hydrocortisone cream   Topical QHS  . levothyroxine  112 mcg Oral QAC breakfast  . multivitamin with minerals  1 tablet Oral Daily  . pantoprazole  40 mg Oral Daily  . pravastatin  20 mg Oral QPM  . senna-docusate  2 tablet Oral QHS  . sodium chloride flush  3 mL Intravenous Q12H   Continuous Infusions:    LOS: 6 days    Time spent: 15 minutes  Greater than 50% of the time spent on counseling and coordinating the care.   Leisa Lenz, MD [facility] Pager [phone]  If 7PM-7AM, please contact night-coverage [email or web address] Password [facility] 12/03/2016, 11:54 AM

## 2016-12-03 NOTE — Progress Notes (Signed)
Pharmacy Antibiotic Note  [name] is a 82 y.o. female admitted on 11/27/2016 with pneumonia.  Pharmacy has been consulted for cefepime (currently on day 7 of therapy).  -CrCl ~ 40, cultures ngtd  Plan: Cefepime 1g IV q24h Consider a total of 7-8days antibiotics? Monitor culture data, renal function and clinical course   Height: 4\' 11"  (149.9 cm) Weight: 107 lb 5.8 oz (48.7 kg) IBW/kg (Calculated) : 43.2  Temp (24hrs), Avg:98.1 F (36.7 C), Min:97.8 F (36.6 C), Max:98.6 F (37 C)   Recent Labs Lab 11/29/16 0239 11/29/16 1101 11/30/16 0303 12/01/16 0226 12/02/16 0234 12/03/16 0246  WBC 9.9 11.4* 8.9 10.5 9.2 9.2  CREATININE 0.69  --  0.83 0.59 0.72 0.65    Estimated Creatinine Clearance: 39.5 mL/min (by C-G formula based on SCr of 0.65 mg/dL).    Allergies  Allergen Reactions  . Contrast Media [Iodinated Diagnostic Agents] Shortness Of Breath  . Iohexol Shortness Of Breath  . Penicillins Rash    Has patient had a PCN reaction causing immediate rash, facial/tongue/throat swelling, SOB or lightheadedness with hypotension: No Has patient had a PCN reaction causing severe rash involving mucus membranes or skin necrosis: No Has patient had a PCN reaction that required hospitalization No Has patient had a PCN reaction occurring within the last 10 years: No If all of the above answers are "NO", then may proceed with Cephalosporin use.    Antimicrobials this admission:  1/16 vanc>>1/18 1/16 cefepime>>  Dose adjustments this admission:  Microbiology results:  1/16 BCx:neg 1/16 Flu A: neg 1/17 MRSA PCR: neg  Hildred Laser, Pharm D 12/03/2016 9:45 AM

## 2016-12-03 NOTE — Progress Notes (Signed)
Physical Therapy Treatment Patient Details Name: [name] MRN: [id number] DOB: 06/28/1936 Today's Date: 12/03/2016    History of Present Illness Pt adm with afib with rvr and pna. PMH - osteoporosis, htn, breast ca, acdf, multiple vertebral fx's    PT Comments    Pt ambulation con't to be limited by DOE/SOB requiring frequent rest breaks. Acute PT to con't to follow. Pt reports going for procedure to check heart function tomorrow (suspect TEE). Pt to benefit from SNF however adamantly refusing.  Follow Up Recommendations  Home health PT;Supervision for mobility/OOB     Equipment Recommendations  Rolling walker with 5" wheels    Recommendations for Other Services       Precautions / Restrictions Precautions Precautions: Fall Restrictions Weight Bearing Restrictions: No    Mobility  Bed Mobility Overal bed mobility: Needs Assistance Bed Mobility: Supine to Sit     Supine to sit: Supervision;HOB elevated     General bed mobility comments: pt didn't use bed rail this date but hob was elevated  Transfers Overall transfer level: Needs assistance Equipment used: Rolling walker (2 wheeled) Transfers: Sit to/from Stand Sit to Stand: Min guard         General transfer comment: pt with good hand placement  Ambulation/Gait Ambulation/Gait assistance: Min guard Ambulation Distance (Feet): 80 Feet Assistive device: Rolling walker (2 wheeled) Gait Pattern/deviations: Step-through pattern;Decreased stride length Gait velocity: decreased Gait velocity interpretation: Below normal speed for age/gender General Gait Details: pt stopping every 10-15 and leaning over on RW to catch breath. Pt SPO2 >92% on RA with one drop to 84 however questionable read.    Stairs            Wheelchair Mobility    Modified Rankin (Stroke Patients Only)       Balance Overall balance assessment: Needs assistance Sitting-balance support: No upper extremity  supported Sitting balance-Leahy Scale: Good     Standing balance support: No upper extremity supported;During functional activity Standing balance-Leahy Scale: Fair Standing balance comment: pt washed hands at sink without difficulty                    Cognition Arousal/Alertness: Awake/alert Behavior During Therapy: WFL for tasks assessed/performed Overall Cognitive Status: Within Functional Limits for tasks assessed                      Exercises      General Comments General comments (skin integrity, edema, etc.): pt supervision for transfer on/off commode. pt indep with hygiene,      Pertinent Vitals/Pain Pain Assessment: 0-10 Pain Score: 4  Pain Location: back and headache Pain Descriptors / Indicators: Headache Pain Intervention(s): Monitored during session    Home Living                      Prior Function            PT Goals (current goals can now be found in the care plan section) Acute Rehab PT Goals Patient Stated Goal: home asap Progress towards PT goals: Progressing toward goals    Frequency    Min 3X/week      PT Plan Current plan remains appropriate    Co-evaluation             End of Session Equipment Utilized During Treatment: Gait belt Activity Tolerance: Patient limited by fatigue Patient left: in chair;with call bell/phone within reach     Time: 1240-1300 PT Time  Calculation (min) (ACUTE ONLY): 20 min  Charges:  $Gait Training: 8-22 mins                    G Codes:      Berline Lopes 12-04-16, 1:08 PM   Kittie Plater, PT, DPT Pager #: [phone] Office #: [phone]

## 2016-12-03 NOTE — Clinical Social Work Note (Signed)
Clinical Social Work Assessment  Patient Details  Name: [name] MRN: [id number] Date of Birth: 1936/03/23  Date of referral:  12/03/16               Reason for consult:  Grief and Loss, Emotional/Coping/Adjustment to Illness                Permission sought to share information with:  Case Manager Permission granted to share information::  No  Name::        Agency::     Relationship::     Contact Information:     Housing/Transportation Living arrangements for the past 2 months:  Single Family Home Source of Information:  Patient Patient Interpreter Needed:  None Criminal Activity/Legal Involvement Pertinent to Current Situation/Hospitalization:  No - Comment as needed Significant Relationships:  Other Family Members, Community Support, Friend Lives with:  Self Do you feel safe going back to the place where you live?  Yes Need for family participation in patient care:  No (Coment)  Care giving concerns:  No concerns noted with returning home. Plan for patient is home with home health.  Patient lives alone, recently lost her husband in October and still in acute grief/transisition.   Patient has sister as support as well as other family members.   Social Worker assessment / plan:  LCSW completed consult for psycho-social referral/grief and loss/resources.  Patient recently lost her husband, married for 43 years and still dealing with acute stress of loss. Patient in good spirits when arrived.  Discussed her marriage and loss of her husband.  She reports she is coping, but misses him deeply.  She reports her sister and other family members have supported her and continue to support patient.  Patient at this time has declined resources for grief counseling.  She is open and accepting to home health and LCSW has requested a SW to also be part of team that goes out to home.  She is in agreement.  Plan: home at discharge with home health.  Home health   She voices no other SW  needs at this time. Available if needs arise.  Employment status:  Retired Forensic scientist:  Medicare PT Recommendations:  Home with [place] / Referral to community resources:  Outpatient Psychiatric Care (Comment Required), Support Groups  Patient/Family's Response to care:  Patient tearful and open about the loss of her husband. Appreciative of visit and information.  Patient/Family's Understanding of and Emotional Response to Diagnosis, Current Treatment, and Prognosis:  Patient understands reason for consult and interventions suggested.  She is open to speaking with SW in home, but at the current time not ready for formalized therapy.  Emotional Assessment Appearance:  Appears stated age Attitude/Demeanor/Rapport:    Affect (typically observed):  Accepting, Sad, Tearful/Crying Orientation:  Oriented to Self, Oriented to Place, Oriented to  Time, Oriented to Situation Alcohol / Substance use:  Not Applicable Psych involvement (Current and /or in the community):  No (Comment)  Discharge Needs  Concerns to be addressed:  No discharge needs identified Readmission within the last 30 days:  No Current discharge risk:  None Barriers to Discharge:  No Barriers Identified   [name], LCSW 12/03/2016, 2:10 PM

## 2016-12-03 NOTE — Progress Notes (Signed)
Progress Note  Patient Name: [name] Date of Encounter: 12/03/2016  Primary Cardiologist: Dr Meda Coffee   Subjective   Feels poorly. Mouth is dry from being NPO. Weak. Doesn't think she is strong enough to take care of herself at home.   Inpatient Medications    Scheduled Meds: . amitriptyline  50 mg Oral QHS  . apixaban  5 mg Oral BID  . ceFEPime (MAXIPIME) IV  1 g Intravenous Q24H  . cholecalciferol  1,000 Units Oral Daily  . darifenacin  7.5 mg Oral Daily  . diltiazem  300 mg Oral Daily  . feeding supplement (ENSURE ENLIVE)  237 mL Oral BID BM  . flecainide  50 mg Oral Q12H  . hydrocortisone cream   Topical QHS  . levothyroxine  112 mcg Oral QAC breakfast  . multivitamin with minerals  1 tablet Oral Daily  . pantoprazole  40 mg Oral Daily  . pravastatin  20 mg Oral QPM  . senna-docusate  2 tablet Oral QHS  . sodium chloride flush  3 mL Intravenous Q12H   Continuous Infusions:  PRN Meds: sodium chloride, acetaminophen, bisacodyl, butalbital-acetaminophen-caffeine, HYDROcodone-acetaminophen, magnesium hydroxide, ondansetron (ZOFRAN) IV, polyethylene glycol, zolpidem   Vital Signs    Vitals:   12/02/16 1322 12/02/16 1901 12/03/16 0600 12/03/16 0608  BP: 110/69 111/73 118/66   Pulse: 83 73 74   Resp: 16 18 18    Temp: 98 F (36.7 C) 98.6 F (37 C) 97.8 F (36.6 C)   TempSrc: Oral Oral Oral   SpO2: 99% 97% 97%   Weight:    107 lb 5.8 oz (48.7 kg)  Height:        Intake/Output Summary (Last 24 hours) at 12/03/16 0848 Last data filed at 12/02/16 1700  Gross per 24 hour  Intake              480 ml  Output                0 ml  Net              480 ml   Filed Weights   12/01/16 0444 12/02/16 0215 12/03/16 0608  Weight: 115 lb 15.4 oz (52.6 kg) 108 lb 4.8 oz (49.1 kg) 107 lb 5.8 oz (48.7 kg)     ECG    NSR- Personally Reviewed  GEN: No acute distress, elderly and frail.   Neck: No JVD Cardiac: RRR, 1/6 SEM at the apex  Respiratory: Clear to  auscultation bilaterally. GI: Soft, nontender, non-distended  MS: No edema; No deformity. Neuro:  AAOx3. Psych: Normal affect  Labs    Chemistry Recent Labs Lab 12/01/16 0226 12/02/16 0234 12/03/16 0246  NA 133* 136 134*  K 3.9 3.9 4.2  CL 99* 101 99*  CO2 26 29 27   GLUCOSE 96 103* 93  BUN 20 16 14   CREATININE 0.59 0.72 0.65  CALCIUM 8.3* 8.5* 8.4*  GFRNONAA >60 >60 >60  GFRAA >60 >60 >60  ANIONGAP 8 6 8      Hematology Recent Labs Lab 12/01/16 0226 12/02/16 0234 12/03/16 0246  WBC 10.5 9.2 9.2  RBC 4.30 4.54 4.35  HGB 12.2 12.9 12.6  HCT 35.9* 38.0 36.6  MCV 83.5 83.7 84.1  MCH 28.4 28.4 29.0  MCHC 34.0 33.9 34.4  RDW 14.2 14.0 13.8  PLT 256 271 285    Cardiac Enzymes Recent Labs Lab 11/27/16 1824  TROPONINI <0.03    Recent Labs Lab 11/27/16 1132  TROPIPOC 0.01  BNP Recent Labs Lab 11/27/16 2032  BNP 443.6*     DDimer No results for input(s): DDIMER in the last 168 hours.   Radiology    No results found.  Cardiac Studies   2D Echo 11/28/16 Study Conclusions  - Left ventricle: The cavity size was normal. There was mild   concentric hypertrophy. Systolic function was normal. The   estimated ejection fraction was in the range of 55% to 60%. Wall   motion was normal; there were no regional wall motion   abnormalities. The study was not technically sufficient to allow   evaluation of LV diastolic dysfunction due to atrial   fibrillation. - Aortic valve: There was mild to moderate regurgitation. - Mitral valve: There is thickening of both leaflets with bileaflet   prolapse posterior > anterior. There is centrally directed jet of   at least moderate mitral regurgitation. - Left atrium: The atrium was severely dilated. - Right atrium: The atrium was moderately dilated. - Tricuspid valve: There was severe regurgitation. - Pulmonary arteries: Systolic pressure was severely increased. PA   peak pressure: 67 mm Hg (S). - Inferior vena  cava: The vessel was dilated. The respirophasic   diameter changes were blunted (< 50%), consistent with elevated   central venous pressure.  Patient Profile     81 y.o. female with a h/o newly discovered afib with RVR admitted with SOB. Echo reveals mitral valve prolapse with at least moderate MR, severe pulmonary hypertension, and severe LA enlargement. She has chads2vasc score of at least 5 as well as prior DVT. She is on rate control therapy with Cardizem + Eliquis for a/c.   Assessment & Plan    1. Atrial Fibrillation: spontaneous conversion to NSR. Given normal EF, no significant LVH, and no known CAD, Flecainide 50 mg BID was initiated. She remains on Cardizem for rate control and Eliquis for a/c. She has chads2vasc score of at least 5 as well as prior DVT.  2. Mitral Regurgitation: She has at least moderate MR with severe pulmonary hypertension, mitral leaflet abnormality, and severe LA enlargement.  Plan is for TEE to further assess. Per endo, unable to perform today due to scheduling. Will schedule for tomorrow. Ultimately, she may require mitral valve repair with MAZE.  She is well known to Dr Prescott Gum.  3. HTN: controlled and stable. 118/66.   4. Thoracic Aneurysm: 4.3 cm fusiform ascending thoracic aneurysm followed by Dr Darcey Nora  Signed, Lyda Jester, PA-C  12/03/2016, 8:48 AM    Personally seen and examined. Agree with above.  Ordered PT - weak. Stated she would not go to facility however.  PAF - now NSR MR - at least moderate. ?worse. TEE would be helpful. Unable today due to sched. Have set up for tomorrow at 8am. Will give diet.  Offered her DC home with outpatient TEE, she thinks she is too weak to go home. PT ordered as above.   RRR, 2/6 HSM apex Thin, frail. Esotropia.   Candee Furbish, MD

## 2016-12-04 ENCOUNTER — Inpatient Hospital Stay (HOSPITAL_COMMUNITY): Payer: Medicare Other

## 2016-12-04 ENCOUNTER — Encounter (HOSPITAL_COMMUNITY): Payer: Self-pay | Admitting: *Deleted

## 2016-12-04 ENCOUNTER — Encounter (HOSPITAL_COMMUNITY)
Admission: EM | Disposition: A | Discharge status: Home Health | Payer: Self-pay | Source: Home / Self Care | Attending: Internal Medicine

## 2016-12-04 DIAGNOSIS — I340 Nonrheumatic mitral (valve) insufficiency: Secondary | ICD-10-CM

## 2016-12-04 DIAGNOSIS — I341 Nonrheumatic mitral (valve) prolapse: Secondary | ICD-10-CM

## 2016-12-04 HISTORY — PX: TEE WITHOUT CARDIOVERSION: SHX5443

## 2016-12-04 LAB — PROTIME-INR
INR: 1.49
Prothrombin Time: 18.1 seconds — ABNORMAL HIGH (ref 11.4–15.2)

## 2016-12-04 LAB — CBC
HCT: 37.1 % (ref 36.0–46.0)
Hemoglobin: 12.6 g/dL (ref 12.0–15.0)
MCH: 28.3 pg (ref 26.0–34.0)
MCHC: 34 g/dL (ref 30.0–36.0)
MCV: 83.4 fL (ref 78.0–100.0)
Platelets: 280 10*3/uL (ref 150–400)
RBC: 4.45 MIL/uL (ref 3.87–5.11)
RDW: 14.1 % (ref 11.5–15.5)
WBC: 8.1 10*3/uL (ref 4.0–10.5)

## 2016-12-04 LAB — BASIC METABOLIC PANEL
Anion gap: 6 (ref 5–15)
BUN: 17 mg/dL (ref 6–20)
CO2: 26 mmol/L (ref 22–32)
Calcium: 8.6 mg/dL — ABNORMAL LOW (ref 8.9–10.3)
Chloride: 101 mmol/L (ref 101–111)
Creatinine, Ser: 0.67 mg/dL (ref 0.44–1.00)
GFR calc Af Amer: >60 mL/min (ref >60)
GLUCOSE: 109 mg/dL — AB (ref 65–99)
Potassium: 4.1 mmol/L (ref 3.5–5.1)
Sodium: 133 mmol/L — ABNORMAL LOW (ref 135–145)

## 2016-12-04 SURGERY — ECHOCARDIOGRAM, TRANSESOPHAGEAL
Anesthesia: Moderate Sedation

## 2016-12-04 MED ORDER — SODIUM CHLORIDE 0.9 % IV SOLN
INTRAVENOUS | Status: DC
  Administered 2016-12-04: 08:00:00 via INTRAVENOUS

## 2016-12-04 MED ORDER — MIDAZOLAM HCL 5 MG/ML IJ SOLN
INTRAMUSCULAR | Status: AC
  Filled 2016-12-04: qty 2

## 2016-12-04 MED ORDER — LIDOCAINE VISCOUS 2 % MT SOLN
OROMUCOSAL | Status: AC
  Filled 2016-12-04: qty 30

## 2016-12-04 MED ORDER — MIDAZOLAM HCL 10 MG/2ML IJ SOLN
INTRAMUSCULAR | Status: DC | PRN
  Administered 2016-12-04 (×2): 2 mg via INTRAVENOUS

## 2016-12-04 MED ORDER — FENTANYL CITRATE (PF) 100 MCG/2ML IJ SOLN
INTRAMUSCULAR | Status: AC
  Filled 2016-12-04: qty 2

## 2016-12-04 MED ORDER — FENTANYL CITRATE (PF) 100 MCG/2ML IJ SOLN
INTRAMUSCULAR | Status: DC | PRN
  Administered 2016-12-04: 25 ug via INTRAVENOUS

## 2016-12-04 MED ORDER — BUTAMBEN-TETRACAINE-BENZOCAINE 2-2-14 % EX AERO
INHALATION_SPRAY | CUTANEOUS | Status: DC | PRN
  Administered 2016-12-04: 2 via TOPICAL

## 2016-12-04 NOTE — Progress Notes (Signed)
Echocardiogram Echocardiogram Transesophageal has been performed.  [name] 12/04/2016, 8:47 AM

## 2016-12-04 NOTE — CV Procedure (Signed)
TRANSESOPHAGEAL ECHOCARDIOGRAM (TEE) NOTE  INDICATIONS: mitral regurgitation  PROCEDURE:   Informed consent was obtained prior to the procedure. The risks, benefits and alternatives for the procedure were discussed and the patient comprehended these risks.  Risks include, but are not limited to, cough, sore throat, vomiting, nausea, somnolence, esophageal and stomach trauma or perforation, bleeding, low blood pressure, aspiration, pneumonia, infection, trauma to the teeth and death.    After a procedural time-out, the patient was given 4 mg versed and 25 mcg fentanyl for moderate sedation.  The patient's heart rate, blood pressure, and oxygen saturation are monitored continuously during the procedure.The oropharynx was anesthetized with 2 cetacaine sprays. The transesophageal probe was inserted in the esophagus and stomach without difficulty and multiple views were obtained.  The patient was kept under observation until the patient left the procedure room.  The period of conscious sedation is 19 minutes, of which I was present face-to-face 100% of this time. The patient left the procedure room in stable condition.   Agitated microbubble saline contrast was administered.  COMPLICATIONS:    There were no immediate complications.  Findings:  1. LEFT VENTRICLE: The left ventricular wall thickness is mildly increased.  The left ventricular cavity is normal in size. Wall motion is normal.  LVEF is 60-65%.  2. RIGHT VENTRICLE:  The right ventricle is poorly visualized.    3. LEFT ATRIUM:  The left atrium is moderate to severely dilated in size without any thrombus or masses.  There is not spontaneous echo contrast ("smoke") in the left atrium consistent with a low flow state.  4. LEFT ATRIAL APPENDAGE:  The left atrial appendage is free of any thrombus or masses. The appendage has single lobes. Pulse doppler indicates moderate flow in the appendage.  5. ATRIAL SEPTUM:  The atrial septum  appears intact and is free of thrombus and/or masses.  There is no evidence for interatrial shunting by color doppler and saline microbubble.  6. RIGHT ATRIUM:  The right atrium is normal in size and function without any thrombus or masses.  7. MITRAL VALVE:  The mitral valve demonstrates thickening of both leaflets with predominant prolapse of the P2 segment of the posterior leaflet - no flail subvalvular apparatus is noted. There is Moderate eccentric regurgitation directed posteriorly. RVol 40 ml - PISA radius 0.8 cm. There were no vegetations or stenosis.  8. AORTIC VALVE:  The aortic valve is sclerotic with Mild regurgitation.  There were no vegetations or stenosis  9. TRICUSPID VALVE:  The tricuspid valve is normal in structure and function with Mild regurgitation.  There were no vegetations or stenosis  10.  PULMONIC VALVE:  The pulmonic valve is normal in structure and function with trivial regurgitation.  There were no vegetations or stenosis.   11. AORTIC ARCH, ASCENDING AND DESCENDING AORTA:  There was grade 1 Ron Parker et. Al, 1992) atherosclerosis of the ascending aorta, aortic arch, or proximal descending aorta. The ascending aorta measures up to 4.5 cm at the most distally visualized segment.  12. PULMONARY VEINS: Anomalous pulmonary venous return was not noted.  13. PERICARDIUM: The pericardium appeared normal and non-thickened.  There is no pericardial effusion.  IMPRESSION:   1. Form fruste variant of Barlow's disease - thickening and "billowing" prolapse of the posterior mitral leaflet, predominantly the P2 segment with moderate regurgitation - no flow reversal noted in the pulmonary veins. The leaflet does not appear flail. 2. Aortic sclerosis with mild AI 3. Mild TR (reported to be  severe on the TTE, may be due to poor right heart visualization) 4. No LAA thrombus, moderate to severe LAE 5. Negative for PFO 6. LVEF 60-65%  RECOMMENDATIONS:    1. No clear indication for  MVR at this time based on findings. Close monitoring of mitral regurgitation is recommended.  Time Spent Directly with the Patient:  30 minutes   Pixie Casino, MD, [facility] Attending Cardiologist [facility]  12/04/2016, 8:29 AM

## 2016-12-04 NOTE — Progress Notes (Signed)
Progress Note  Patient Name: [name] Date of Encounter: 12/04/2016  Primary Cardiologist: Dr Meda Coffee   Subjective   Feels poorly. Mouth is dry from being NPO. Weak. PT rec home PT. She refuses SNF  Inpatient Medications    Scheduled Meds: . amitriptyline  50 mg Oral QHS  . apixaban  5 mg Oral BID  . ceFEPime (MAXIPIME) IV  1 g Intravenous Q24H  . cholecalciferol  1,000 Units Oral Daily  . darifenacin  7.5 mg Oral Daily  . diltiazem  300 mg Oral Daily  . feeding supplement (ENSURE ENLIVE)  237 mL Oral BID BM  . flecainide  50 mg Oral Q12H  . hydrocortisone cream   Topical QHS  . levothyroxine  112 mcg Oral QAC breakfast  . multivitamin with minerals  1 tablet Oral Daily  . pantoprazole  40 mg Oral Daily  . pravastatin  20 mg Oral QPM  . senna-docusate  2 tablet Oral QHS  . sodium chloride flush  3 mL Intravenous Q12H   Continuous Infusions:  PRN Meds: sodium chloride, acetaminophen, bisacodyl, butalbital-acetaminophen-caffeine, HYDROcodone-acetaminophen, magnesium hydroxide, ondansetron (ZOFRAN) IV, polyethylene glycol, zolpidem   Vital Signs    Vitals:   12/04/16 0856 12/04/16 0940 12/04/16 1235 12/04/16 1358  BP:  112/74 132/69 120/74  Pulse: 71 70  70  Resp: 17 18  17   Temp:  97.8 F (36.6 C)  98 F (36.7 C)  TempSrc:  Oral  Oral  SpO2: 96% 96%  95%  Weight:      Height:        Intake/Output Summary (Last 24 hours) at 12/04/16 1525 Last data filed at 12/04/16 1300  Gross per 24 hour  Intake             1020 ml  Output                0 ml  Net             1020 ml   Filed Weights   12/03/16 0608 12/04/16 0304 12/04/16 0735  Weight: 107 lb 5.8 oz (48.7 kg) 107 lb (48.5 kg) 107 lb (48.5 kg)     ECG    NSR- Personally Reviewed  GEN: No acute distress, elderly and frail. Thin  Neck: No JVD Cardiac: RRR, 1/6 SEM at the apex  Respiratory: Clear to auscultation bilaterally. GI: Soft, nontender, non-distended  MS: No edema; No  deformity. Neuro:  AAOx3. Psych: Normal affect  Labs    Chemistry  Recent Labs Lab 12/02/16 0234 12/03/16 0246 12/04/16 0248  NA 136 134* 133*  K 3.9 4.2 4.1  CL 101 99* 101  CO2 29 27 26   GLUCOSE 103* 93 109*  BUN 16 14 17   CREATININE 0.72 0.65 0.67  CALCIUM 8.5* 8.4* 8.6*  GFRNONAA >60 >60 >60  GFRAA >60 >60 >60  ANIONGAP 6 8 6      Hematology  Recent Labs Lab 12/02/16 0234 12/03/16 0246 12/04/16 0248  WBC 9.2 9.2 8.1  RBC 4.54 4.35 4.45  HGB 12.9 12.6 12.6  HCT 38.0 36.6 37.1  MCV 83.7 84.1 83.4  MCH 28.4 29.0 28.3  MCHC 33.9 34.4 34.0  RDW 14.0 13.8 14.1  PLT 271 285 280    Cardiac Enzymes  Recent Labs Lab 11/27/16 1824  TROPONINI <0.03   No results for input(s): TROPIPOC in the last 168 hours.   BNP  Recent Labs Lab 11/27/16 2032  BNP 443.6*     DDimer No  results for input(s): DDIMER in the last 168 hours.   Radiology    Dg Chest Port 1 View  Result Date: 12/04/2016 CLINICAL DATA:  Pneumonia EXAM: PORTABLE CHEST 1 VIEW COMPARISON:  11/29/2016 FINDINGS: Patient is rotated to the left. There is cardiomegaly with vascular congestion heel bilateral airspace opacities are noted, right greater than left. This could represent asymmetric edema or infection. Airspace disease may have worsened in the right lung since prior study. Stable small right effusion. No acute bony abnormality. IMPRESSION: Asymmetric bilateral airspace disease, right greater than left, slightly worsened since prior study. Small right effusion. Electronically Signed   By: Rolm Baptise M.D.   On: 12/04/2016 15:06    Cardiac Studies   2D Echo 11/28/16 Study Conclusions  - Left ventricle: The cavity size was normal. There was mild   concentric hypertrophy. Systolic function was normal. The   estimated ejection fraction was in the range of 55% to 60%. Wall   motion was normal; there were no regional wall motion   abnormalities. The study was not technically sufficient to  allow   evaluation of LV diastolic dysfunction due to atrial   fibrillation. - Aortic valve: There was mild to moderate regurgitation. - Mitral valve: There is thickening of both leaflets with bileaflet   prolapse posterior > anterior. There is centrally directed jet of   at least moderate mitral regurgitation. - Left atrium: The atrium was severely dilated. - Right atrium: The atrium was moderately dilated. - Tricuspid valve: There was severe regurgitation. - Pulmonary arteries: Systolic pressure was severely increased. PA   peak pressure: 67 mm Hg (S). - Inferior vena cava: The vessel was dilated. The respirophasic   diameter changes were blunted (< 50%), consistent with elevated   central venous pressure.  Patient Profile     81 y.o. female with a h/o newly discovered afib with RVR admitted with SOB. Echo reveals mitral valve prolapse with at least moderate MR, severe pulmonary hypertension, and severe LA enlargement. She has chads2vasc score of at least 5 as well as prior DVT. She is on rate control therapy with Cardizem + Eliquis for a/c.   Assessment & Plan    1. Atrial Fibrillation: spontaneous conversion to NSR. Given normal EF, no significant LVH, and no known CAD, Flecainide 50 mg BID was initiated. She remains on Cardizem for rate control and Eliquis for a/c. She has chads2vasc score of at least 5 as well as prior DVT.  2. Mitral Regurgitation:  Moderate MR with severe pulmonary hypertension, mitral leaflet abnormality, and severe LA enlargement.  Plan is for TEE to further assess. TEE performed. No indication for surgery at this time. Ultimately, she may require mitral valve repair with MAZE in the future.  She is well known to Dr Prescott Gum.  3. HTN: controlled and stable. 118/66.   4. Thoracic Aneurysm: 4.3 cm fusiform ascending thoracic aneurysm followed by Dr Darcey Nora  OK with DC home (likely tomorrow). No new recs. Will sign off. Please call if ?Marland Kitchen  Signed, Candee Furbish, MD  12/04/2016, 3:25 PM

## 2016-12-04 NOTE — Progress Notes (Signed)
Patient ID: [name], female   DOB: 1935-11-28, 81 y.o.   MRN: [id number]  PROGRESS NOTE    AADITRI CHAVEZ  [id number] DOB: 1936-04-17 DOA: 11/27/2016  PCP: [name], MD   Brief Narrative:  81 y.o. female with history of osteoporosis, dyslipidemia, hypertension, hypothyroidism, breast Ca (lumpx/ XRT 2012-3), history of DVT in 2012, anxiety. She presented to ED with worsening shortness of breath and fatigue for past 3 weeks prior to this admission. No reports of chest pan. On admission, she was in A fib with RVR. CXR showed pulmonary infiltrates, right ,ore than left concerning for pneumonia. Cardiology consulted for Afib with RVR. Pt started on IV Cardizem.   Transferred to telemetry 1/19.  Assessment & Plan:  Acute respiratory failure with hypoxia / Lobar pneumonia, right upper and lower lobe unspecified organism / Leukocytosis  - Stable respiratory status  - CXR 1/18 showed asymmetric streaky airspace disease in right upper lobe and right lower lobe highly suspicious for pneumonia - Stopped vanco 1/18 and continued cefepime  - Repeat CXR this am to monitor on resolution of pneumonia  - Blood cx negative  - Influenza negative   Atrial fibrillation with RVR - CHADS vasc score 4 - Troponin was WNL - Continue aspirin and eliquis  - Cardizem drip stopped and started on Cardizem 120 mg Q 12 hours on 1/18, changed to 300 mg daily per cardio 1/21 - Cardio added flecainide 50 mg Q12 hours started 1/21 - Plan for TEE today to evaluate severity of mitral regurgitation   Acute on chronic diastolic CHF - BNP on admission 443 - 2 D ECHO with preserved EF - Was on lasix 20 mg IV BID; cardio stopped lasix 1/19 - Continue daily weight and strict intake and output  Hypokalemia - Due to lasix - Supplemented and WNL  LLE DVT in 2012 - Continue AC with eliquis   GERD without esophagitis - Continue protonix   Anxiety  - Continue amitriptyline   Dyslipidemia -  Continue Pravachol  Hypothyroidism - Continue synthroid  - TSH WNL on 11/27/16  Headache - Continue Toradol and Fioricet  - Improved  Moderate protein calorie malnutrition - In the setting of chronic illness - Continue nutritional supplementation    DVT prophylaxis: Pt on apixaban  Code Status: full code  Family Communication: No family at the bedside this am; called pt spouse and left VM with my cell numebr to call me back for updates  Disposition Plan: TEE today    Consultants:   Cardio PT  Procedures:   TEE planned for this am  Antimicrobials:   Cefepime 11/27/16 -->  Vanco 11/27/16 --> 11/29/2016    Subjective: No overnight events.   Objective: Vitals:   12/04/16 0855 12/04/16 0856 12/04/16 0940 12/04/16 1235  BP:   112/74 132/69  Pulse: 71 71 70   Resp: 17 17 18    Temp:   97.8 F (36.6 C)   TempSrc:   Oral   SpO2: 93% 96% 96%   Weight:      Height:        Intake/Output Summary (Last 24 hours) at 12/04/16 1304 Last data filed at 12/04/16 0841  Gross per 24 hour  Intake              780 ml  Output                0 ml  Net  780 ml   Filed Weights   12/03/16 0608 12/04/16 0304 12/04/16 0735  Weight: 48.7 kg (107 lb 5.8 oz) 48.5 kg (107 lb) 48.5 kg (107 lb)    Examination:  General exam: No distress, comfortable  Respiratory system: diminished breath sounds but no wheezing  Cardiovascular system: S1 & S2 (+), 4/6 systolic murmur  Gastrointestinal system: (+) BS, non tender, non distended   Central nervous system: No focal deficits  Extremities: No LE edema, palpable pulses bilaterally  Skin: skin is warm and dry, no lesions or ulcers  Psychiatry: Normal mood and behavior  Data Reviewed: I have personally reviewed following labs and imaging studies  CBC:  Recent Labs Lab 11/30/16 0303 12/01/16 0226 12/02/16 0234 12/03/16 0246 12/04/16 0248  WBC 8.9 10.5 9.2 9.2 8.1  HGB 11.7* 12.2 12.9 12.6 12.6  HCT 34.1* 35.9* 38.0  36.6 37.1  MCV 83.2 83.5 83.7 84.1 83.4  PLT 235 256 271 285 [id number]   Basic Metabolic Panel:  Recent Labs Lab 11/29/16 0239 11/30/16 0303 12/01/16 0226 12/02/16 0234 12/03/16 0246 12/04/16 0248  NA 134* 133* 133* 136 134* 133*  K 3.8 3.5 3.9 3.9 4.2 4.1  CL 101 101 99* 101 99* 101  CO2 25 25 26 29 27 26   GLUCOSE 111* 100* 96 103* 93 109*  BUN 16 20 20 16 14 17   CREATININE 0.69 0.83 0.59 0.72 0.65 0.67  CALCIUM 8.5* 8.3* 8.3* 8.5* 8.4* 8.6*  MG 1.9  --   --   --   --   --    GFR: Estimated Creatinine Clearance: 39.5 mL/min (by C-G formula based on SCr of 0.67 mg/dL). Liver Function Tests: No results for input(s): AST, ALT, ALKPHOS, BILITOT, PROT, ALBUMIN in the last 168 hours. No results for input(s): LIPASE, AMYLASE in the last 168 hours. No results for input(s): AMMONIA in the last 168 hours. Coagulation Profile:  Recent Labs Lab 12/04/16 0248  INR 1.49   Cardiac Enzymes:  Recent Labs Lab 11/27/16 1824  TROPONINI <0.03   BNP (last 3 results) No results for input(s): PROBNP in the last 8760 hours. HbA1C: No results for input(s): HGBA1C in the last 72 hours. CBG: No results for input(s): GLUCAP in the last 168 hours. Lipid Profile: No results for input(s): CHOL, HDL, LDLCALC, TRIG, CHOLHDL, LDLDIRECT in the last 72 hours. Thyroid Function Tests: No results for input(s): TSH, T4TOTAL, FREET4, T3FREE, THYROIDAB in the last 72 hours. Anemia Panel: No results for input(s): VITAMINB12, FOLATE, FERRITIN, TIBC, IRON, RETICCTPCT in the last 72 hours. Urine analysis:    Component Value Date/Time   COLORURINE YELLOW 09/04/2015 1404   APPEARANCEUR CLEAR 09/04/2015 1404   LABSPEC 1.036 (H) 09/04/2015 1404   PHURINE 7.0 09/04/2015 1404   GLUCOSEU NEGATIVE 09/04/2015 1404   HGBUR NEGATIVE 09/04/2015 1404   BILIRUBINUR NEGATIVE 09/04/2015 1404   KETONESUR 15 (A) 09/04/2015 1404   PROTEINUR NEGATIVE 09/04/2015 1404   UROBILINOGEN 0.2 09/04/2015 1404   NITRITE NEGATIVE  09/04/2015 1404   LEUKOCYTESUR NEGATIVE 09/04/2015 1404   Sepsis Labs: @LABRCNTIP (procalcitonin:4,lacticidven:4)   Recent Results (from the past 240 hour(s))  MRSA PCR Screening     Status: None   Collection Time: 11/28/16  6:25 AM  Result Value Ref Range Status   MRSA by PCR NEGATIVE NEGATIVE Final      Radiology Studies: Dg Chest Port 1 View Result Date: 11/27/2016 Right worse than left airspace disease most worrisome for pneumonia. Small bilateral pleural effusions. Cardiomegaly. Atherosclerosis.   Dg  Chest Port 1 View Result Date: 11/29/2016 Again noted asymmetric streaky airspace disease in right upper lobe and right lower lobe highly suspicious for pneumonia. Small left pleural effusion with left basilar atelectasis or infiltrate. No pulmonary edema.    Scheduled Meds: . amitriptyline  50 mg Oral QHS  . apixaban  5 mg Oral BID  . ceFEPime (MAXIPIME) IV  1 g Intravenous Q24H  . cholecalciferol  1,000 Units Oral Daily  . darifenacin  7.5 mg Oral Daily  . diltiazem  300 mg Oral Daily  . feeding supplement (ENSURE ENLIVE)  237 mL Oral BID BM  . flecainide  50 mg Oral Q12H  . hydrocortisone cream   Topical QHS  . levothyroxine  112 mcg Oral QAC breakfast  . multivitamin with minerals  1 tablet Oral Daily  . pantoprazole  40 mg Oral Daily  . pravastatin  20 mg Oral QPM  . senna-docusate  2 tablet Oral QHS  . sodium chloride flush  3 mL Intravenous Q12H   Continuous Infusions:    LOS: 7 days    Time spent: 15 minutes  Greater than 50% of the time spent on counseling and coordinating the care.   Leisa Lenz, MD [facility] Pager [phone]  If 7PM-7AM, please contact night-coverage [email or web address] Password [email or web address] 12/04/2016, 1:04 PM

## 2016-12-04 NOTE — H&P (Signed)
    INTERVAL PROCEDURE H&P  History and Physical Interval Note:  12/04/2016 8:01 AM  [name] has presented today for their planned procedure. The various methods of treatment have been discussed with the patient and family. After consideration of risks, benefits and other options for treatment, the patient has consented to the procedure.  The patients' outpatient history has been reviewed, patient examined, and no change in status from most recent office note within the past 30 days. I have reviewed the patients' chart and labs and will proceed as planned. Questions were answered to the patient's satisfaction.   Pixie Casino, MD, [facility] Attending Cardiologist [place] [facility] 12/04/2016, 8:01 AM

## 2016-12-04 NOTE — Consult Note (Signed)
[facility] CM Primary Care Navigator  12/04/2016  [name] [birth year] [id number]  Met with patient at the bedside to identify possible discharge needs. Patient reports that in past few weeks she had productive cough and fatigue. She went to see primary care provider who diagnosed her with Afib and was sent to ED that had led to this admission.  Patient endorses Dr. [name] with [facility] as the primary care provider.    Patient shared using [facility] [place] to obtain medications without any problem.   Patient reports managing own medications at home using "pill box" system weekly.   She states being able to drive prior to admission and will transport herself to her doctors' appointments as stated.  Patient lives alone and independent with care now since she lost her husband in October.  She mentioned having housekeeper that comes to her house to assist when needed.  Discharge plan for patient is home with home health services ([facility]). She was insistent of being able to manage care with [facility] home health assistance at home, as previous. Patient was provided a [facility] list of personal caregivers if future needs may arise.   She agreed with Inpatient SW to have arranged a Education officer, museum as part of the team that goes out to home.  Patient voiced understanding to call primary care provider's office when she gets back home, for a post discharge follow-up appointment within a week or sooner if needs arise. Patient letter provided as her reminder.  Patient denies any further care coordination needs and disease management concerns at this time.  [facility] care management contact information provided for future needs that may arise.   For additional questions please contact:  Edwena Felty A. Kolston Lacount, BSN, RN-BC [facility] PRIMARY CARE Navigator Cell: [phone]

## 2016-12-05 ENCOUNTER — Other Ambulatory Visit: Payer: Medicare Other

## 2016-12-05 ENCOUNTER — Ambulatory Visit: Payer: Medicare Other | Admitting: Cardiothoracic Surgery

## 2016-12-05 DIAGNOSIS — E039 Hypothyroidism, unspecified: Secondary | ICD-10-CM

## 2016-12-05 LAB — CBC
HEMATOCRIT: 38.6 % (ref 36.0–46.0)
Hemoglobin: 12.9 g/dL (ref 12.0–15.0)
MCH: 28 pg (ref 26.0–34.0)
MCHC: 33.4 g/dL (ref 30.0–36.0)
MCV: 83.9 fL (ref 78.0–100.0)
PLATELETS: 306 10*3/uL (ref 150–400)
RBC: 4.6 MIL/uL (ref 3.87–5.11)
RDW: 13.8 % (ref 11.5–15.5)
WBC: 8.7 10*3/uL (ref 4.0–10.5)

## 2016-12-05 LAB — ECHO TEE
AOASC: 45 cm
MV VTI: 121 cm
PISA EROA: 0.33 cm2
Reg peak vel: 226 cm/s
TR max vel: 226 cm/s

## 2016-12-05 LAB — BASIC METABOLIC PANEL
Anion gap: 7 (ref 5–15)
BUN: 20 mg/dL (ref 6–20)
CALCIUM: 8.8 mg/dL — AB (ref 8.9–10.3)
CO2: 28 mmol/L (ref 22–32)
CREATININE: 0.67 mg/dL (ref 0.44–1.00)
Chloride: 101 mmol/L (ref 101–111)
GFR calc Af Amer: >60 mL/min (ref >60)
GLUCOSE: 96 mg/dL (ref 65–99)
Potassium: 4.3 mmol/L (ref 3.5–5.1)
SODIUM: 136 mmol/L (ref 135–145)

## 2016-12-05 NOTE — Progress Notes (Signed)
Patient ID: [name], female   DOB: 1936-02-04, 81 y.o.   MRN: [id number]  PROGRESS NOTE    [name]  [id number] DOB: [birth year] DOA: 11/27/2016  PCP: [name], MD   Brief Narrative:  81 y.o. female with history of osteoporosis, dyslipidemia, hypertension, hypothyroidism, breast Ca (lumpx/ XRT 2012-3), history of DVT in 2012, anxiety. She presented to ED with worsening shortness of breath and fatigue for past 3 weeks prior to this admission. No reports of chest pan. On admission, she was in A fib with RVR. CXR showed pulmonary infiltrates, right ,ore than left concerning for pneumonia. Cardiology consulted for Afib with RVR. Pt started on IV Cardizem.   Transferred to telemetry 1/19.  Assessment & Plan:  Acute respiratory failure with hypoxia / Lobar pneumonia, right upper and lower lobe unspecified organism / Leukocytosis  - Stable respiratory status  - CXR 1/23 showed bilateral airspace disease right greater than left worsened since prior study - Continue cefepime - Blood cx negative  - Influenza negative   Atrial fibrillation with RVR - CHADS vasc score 4 - Troponin WNL - Continue aspirin and eliquis  - Cardizem drip stopped and started on Cardizem 120 mg Q 12 hours on 1/18, changed to 300 mg daily per cardio 1/21 - Cardio added flecainide 50 mg Q12 hours started 1/21  Moderate to severe mitral regurgitation - TEE done 1/23, per cardio no need for surgery and can be managed conservatively with medical management  - She may require mitral valve repair with MAZE in the future  Acute on chronic diastolic CHF - BNP on admission 443 - 2 D ECHO with preserved EF - Was on lasix 20 mg IV BID; cardio stopped lasix 1/19 - Continue daily weight and strict intake and output  Hypokalemia - Due to lasix - Supplemented and WNL  LLE DVT in 2012 - Continue AC with eliquis   GERD without esophagitis - Continue protonix   Anxiety  - Continue  amitriptyline   Dyslipidemia - Continue Pravachol  Hypothyroidism - Continue synthroid  - TSH WNL on 11/27/16  Headache - Continue Toradol and Fioricet  - Improved  Moderate protein calorie malnutrition - In the setting of chronic illness - Continue nutritional supplementation    DVT prophylaxis: Pt on apixaban  Code Status: full code  Family Communication: No family at the bedside this am; called pt spouse and left VM with my cell numebr to call me back for updates  Disposition Plan: home likely tomorrow; needs PT eval   Consultants:   Cardio PT  Procedures:   TEE 1/23  ECHO 1/17 - EF 55-60%  Antimicrobials:   Cefepime 11/27/16 -->  Vanco 11/27/16 --> 11/29/2016, mild to moderate mitral regurgitation    Subjective: No overnight events.   Objective: Vitals:   12/04/16 2049 12/05/16 0304 12/05/16 0500 12/05/16 0747  BP: 112/71 130/71  117/77  Pulse: 73 77  99  Resp: 18 18    Temp: 98 F (36.7 C) 98.4 F (36.9 C)    TempSrc: Oral Oral    SpO2: 97% 97%    Weight:   47.6 kg (105 lb)   Height:        Intake/Output Summary (Last 24 hours) at 12/05/16 1105 Last data filed at 12/05/16 0750  Gross per 24 hour  Intake              360 ml  Output  0 ml  Net              360 ml   Filed Weights   12/04/16 0304 12/04/16 0735 12/05/16 0500  Weight: 48.5 kg (107 lb) 48.5 kg (107 lb) 47.6 kg (105 lb)    Examination:  General exam: No distress, calm and comfortable Respiratory system: diminished breath sounds but no wheezing  Cardiovascular system: S1 & S2 (+), 4/6 systolic murmur  Gastrointestinal system: (+) BS, no distention Central nervous system: Nonfocal Extremities: No LE edema, palpable pulses bilaterally  Skin: no ulcers, no lesions  Psychiatry: Normal mood and behavior, no agitation, no restlessness   Data Reviewed: I have personally reviewed following labs and imaging studies  CBC:  Recent Labs Lab 12/01/16 0226  12/02/16 0234 12/03/16 0246 12/04/16 0248 12/05/16 0330  WBC 10.5 9.2 9.2 8.1 8.7  HGB 12.2 12.9 12.6 12.6 12.9  HCT 35.9* 38.0 36.6 37.1 38.6  MCV 83.5 83.7 84.1 83.4 83.9  PLT 256 271 285 280 [id number]   Basic Metabolic Panel:  Recent Labs Lab 11/29/16 0239  12/01/16 0226 12/02/16 0234 12/03/16 0246 12/04/16 0248 12/05/16 0330  NA 134*  < > 133* 136 134* 133* 136  K 3.8  < > 3.9 3.9 4.2 4.1 4.3  CL 101  < > 99* 101 99* 101 101  CO2 25  < > 26 29 27 26 28   GLUCOSE 111*  < > 96 103* 93 109* 96  BUN 16  < > 20 16 14 17 20   CREATININE 0.69  < > 0.59 0.72 0.65 0.67 0.67  CALCIUM 8.5*  < > 8.3* 8.5* 8.4* 8.6* 8.8*  MG 1.9  --   --   --   --   --   --   < > = values in this interval not displayed. GFR: Estimated Creatinine Clearance: 39.5 mL/min (by C-G formula based on SCr of 0.67 mg/dL). Liver Function Tests: No results for input(s): AST, ALT, ALKPHOS, BILITOT, PROT, ALBUMIN in the last 168 hours. No results for input(s): LIPASE, AMYLASE in the last 168 hours. No results for input(s): AMMONIA in the last 168 hours. Coagulation Profile:  Recent Labs Lab 12/04/16 0248  INR 1.49   Cardiac Enzymes: No results for input(s): CKTOTAL, CKMB, CKMBINDEX, TROPONINI in the last 168 hours. BNP (last 3 results) No results for input(s): PROBNP in the last 8760 hours. HbA1C: No results for input(s): HGBA1C in the last 72 hours. CBG: No results for input(s): GLUCAP in the last 168 hours. Lipid Profile: No results for input(s): CHOL, HDL, LDLCALC, TRIG, CHOLHDL, LDLDIRECT in the last 72 hours. Thyroid Function Tests: No results for input(s): TSH, T4TOTAL, FREET4, T3FREE, THYROIDAB in the last 72 hours. Anemia Panel: No results for input(s): VITAMINB12, FOLATE, FERRITIN, TIBC, IRON, RETICCTPCT in the last 72 hours. Urine analysis:    Component Value Date/Time   COLORURINE YELLOW 09/04/2015 1404   APPEARANCEUR CLEAR 09/04/2015 1404   LABSPEC 1.036 (H) 09/04/2015 1404   PHURINE 7.0  09/04/2015 1404   GLUCOSEU NEGATIVE 09/04/2015 1404   HGBUR NEGATIVE 09/04/2015 1404   BILIRUBINUR NEGATIVE 09/04/2015 1404   KETONESUR 15 (A) 09/04/2015 1404   PROTEINUR NEGATIVE 09/04/2015 1404   UROBILINOGEN 0.2 09/04/2015 1404   NITRITE NEGATIVE 09/04/2015 1404   LEUKOCYTESUR NEGATIVE 09/04/2015 1404   Sepsis Labs: @LABRCNTIP (procalcitonin:4,lacticidven:4)   Recent Results (from the past 240 hour(s))  MRSA PCR Screening     Status: None   Collection Time: 11/28/16  6:25 AM  Result Value Ref Range Status   MRSA by PCR NEGATIVE NEGATIVE Final      Radiology Studies:  Dg Chest Port 1 View Result Date: 12/04/2016 Asymmetric bilateral airspace disease, right greater than left, slightly worsened since prior study. Small right effusion. Electronically Signed   By: Rolm Baptise M.D.   On: 12/04/2016 15:06    Dg Chest Port 1 View Result Date: 11/27/2016 Right worse than left airspace disease most worrisome for pneumonia. Small bilateral pleural effusions. Cardiomegaly. Atherosclerosis.   Dg Chest Port 1 View Result Date: 11/29/2016 Again noted asymmetric streaky airspace disease in right upper lobe and right lower lobe highly suspicious for pneumonia. Small left pleural effusion with left basilar atelectasis or infiltrate. No pulmonary edema.    Scheduled Meds: . amitriptyline  50 mg Oral QHS  . apixaban  5 mg Oral BID  . ceFEPime (MAXIPIME) IV  1 g Intravenous Q24H  . cholecalciferol  1,000 Units Oral Daily  . darifenacin  7.5 mg Oral Daily  . diltiazem  300 mg Oral Daily  . feeding supplement (ENSURE ENLIVE)  237 mL Oral BID BM  . flecainide  50 mg Oral Q12H  . hydrocortisone cream   Topical QHS  . levothyroxine  112 mcg Oral QAC breakfast  . multivitamin with minerals  1 tablet Oral Daily  . pantoprazole  40 mg Oral Daily  . pravastatin  20 mg Oral QPM  . senna-docusate  2 tablet Oral QHS  . sodium chloride flush  3 mL Intravenous Q12H   Continuous Infusions:     LOS: 8 days    Time spent: 25 minutes  Greater than 50% of the time spent on counseling and coordinating the care.   Leisa Lenz, MD [facility] Pager [phone]  If 7PM-7AM, please contact night-coverage [email or web address] Password [email or web address] 12/05/2016, 11:05 AM

## 2016-12-05 NOTE — Progress Notes (Signed)
Physical Therapy Treatment Patient Details Name: ROOP MARLAR MRN: [id number] DOB: [birth year] Today's Date: 12/05/2016    History of Present Illness Pt adm with afib with rvr and pna. PMH - osteoporosis, htn, breast ca, acdf, multiple vertebral fx's    PT Comments    Pt with improved activity tolerance and now functioning at supervision level remains to become SOB and "loose strength" during mobility. Pt appears to be in depressed spirits regarding heart and doesn't understand why she looses strength and breath. Acute PT to con't to follow.   Follow Up Recommendations  Home health PT;Supervision for mobility/OOB     Equipment Recommendations  Rolling walker with 5" wheels    Recommendations for Other Services       Precautions / Restrictions Precautions Precautions: Fall Restrictions Weight Bearing Restrictions: No    Mobility  Bed Mobility Overal bed mobility: Needs Assistance Bed Mobility: Supine to Sit     Supine to sit: Supervision Sit to supine: Supervision   General bed mobility comments: increased time but no physical assist needed  Transfers Overall transfer level: Needs assistance Equipment used: Rolling walker (2 wheeled) Transfers: Sit to/from Stand Sit to Stand: Supervision         General transfer comment: pt with good technique, increased time  Ambulation/Gait Ambulation/Gait assistance: Supervision Ambulation Distance (Feet): 150 Feet Assistive device: Rolling walker (2 wheeled) Gait Pattern/deviations: Step-through pattern;Trunk flexed (severe kyphosis) Gait velocity: dec Gait velocity interpretation: Below normal speed for age/gender General Gait Details: pt with freq standing rest stops (every 15-20') due to "I just loose my breath and strength" Pt SpO2 >94% on RA during amb. HR in 70s. Pt requires significant time. 7 min to walk 150'    Stairs            Wheelchair Mobility    Modified Rankin (Stroke Patients Only)        Balance Overall balance assessment: Needs assistance Sitting-balance support: No upper extremity supported Sitting balance-Leahy Scale: Good     Standing balance support: No upper extremity supported;During functional activity Standing balance-Leahy Scale: Fair Standing balance comment: pt washed hands at sink without difficulty                    Cognition Arousal/Alertness: Awake/alert Behavior During Therapy: WFL for tasks assessed/performed Overall Cognitive Status: Within Functional Limits for tasks assessed                      Exercises      General Comments General comments (skin integrity, edema, etc.): pt supervision for tranfer on/off commode and with hygiene      Pertinent Vitals/Pain Pain Assessment: No/denies pain    Home Living                      Prior Function            PT Goals (current goals can now be found in the care plan section) Progress towards PT goals: Progressing toward goals    Frequency    Min 3X/week      PT Plan Current plan remains appropriate    Co-evaluation             End of Session Equipment Utilized During Treatment: Gait belt Activity Tolerance: Patient limited by fatigue Patient left: with call bell/phone within reach;in bed     Time: [id number] PT Time Calculation (min) (ACUTE ONLY): 19 min  Charges:  $Gait Training: 8-22 mins  G CodesKoleen Distance Garrett Bowring 2016/12/24, 4:13 PM   Kittie Plater, PT, DPT Pager #: [phone] Office #: [phone]

## 2016-12-06 ENCOUNTER — Inpatient Hospital Stay (HOSPITAL_COMMUNITY): Payer: Medicare Other

## 2016-12-06 ENCOUNTER — Encounter (HOSPITAL_COMMUNITY): Payer: Self-pay | Admitting: Internal Medicine

## 2016-12-06 LAB — CBC
HEMATOCRIT: 39.7 % (ref 36.0–46.0)
HEMOGLOBIN: 13.6 g/dL (ref 12.0–15.0)
MCH: 28.6 pg (ref 26.0–34.0)
MCHC: 34.3 g/dL (ref 30.0–36.0)
MCV: 83.4 fL (ref 78.0–100.0)
Platelets: 308 10*3/uL (ref 150–400)
RBC: 4.76 MIL/uL (ref 3.87–5.11)
RDW: 13.9 % (ref 11.5–15.5)
WBC: 8.6 10*3/uL (ref 4.0–10.5)

## 2016-12-06 MED ORDER — LEVOFLOXACIN 500 MG PO TABS
500.0000 mg | ORAL_TABLET | Freq: Every day | ORAL | Status: DC
  Administered 2016-12-06 – 2016-12-10 (×5): 500 mg via ORAL
  Filled 2016-12-06 (×5): qty 1

## 2016-12-06 MED ORDER — DM-GUAIFENESIN ER 30-600 MG PO TB12
1.0000 | ORAL_TABLET | Freq: Two times a day (BID) | ORAL | Status: DC
  Administered 2016-12-06 (×2): 1 via ORAL
  Filled 2016-12-06 (×4): qty 1

## 2016-12-06 NOTE — Progress Notes (Addendum)
PROGRESS NOTE    [name]  [id number] DOB: 1936/05/18 DOA: 11/27/2016 PCP: [name], MD   Brief Narrative: 81 y.o.femalewith history of osteoporosis, dyslipidemia, hypertension, hypothyroidism, breast Ca (lumpx/ XRT 2012-3), history of DVT in 2012, anxiety. She presented to ED with worsening shortness of breath and fatigue for past 3 weeks prior to this admission. No reports of chest pan. On admission, she was in A fib with RVR. CXR showed pulmonary infiltrates, right more than left concerning for pneumonia. Cardiology consulted for Afib with RVR. Pt started on IV Cardizem.   Assessment & Plan:   # Acute respiratory failure with hypoxia / Lobar pneumonia, right upper and lower lobe unspecified organism  - CXR 1/23 showed bilateral airspace disease right greater than left worsened since prior study. The patient reported no improvement in her shortness of breath and cough. The cough is worsening which is dry cough. Patient is on cefepime. I will discontinue cefepime and changed to levaquin for atypical coverage as well. Start mucinex for cough. Since patient has no clinical improvement I will order CT scan of chest to further evaluate the patient. -Influenza negative. Blood cultures negative so far. Today she is on room air.  # Atrial fibrillation with RVR - Continue aspirin and eliquis. Pt required cardizem drip on admission. Switched to oral cardizem 300 mg and started flecainide by Cardiologist. Rate is currently controlled. Recommended outpatient follow-up with cardiologist.  # Moderate to severe mitral regurgitation - TEE done 1/23, per cardio no need for surgery and can be managed conservatively with medical management ( as per prior note) - She may require mitral valve repair with MAZE in the future  # Presumed acute on chronic diastolic CHF - BNP on admission 443. 2 D ECHO with preserved EF - Was on lasix 20 mg IV BID. Currently off Lasix. Continue low-salt  diet. Does not look fluid overloaded on exam.  # LLE DVT in 2012 - Continue AC with eliquis   # GERD without esophagitis - Continue protonix   # Anxiety  - Continue amitriptyline   # Dyslipidemia - Continue Pravachol  # Hypothyroidism - Continue synthroid  - TSH WNL on 11/27/16  # Possible moderate protein calorie malnutrition - In the setting of chronic illness - Continue nutritional supplementation    Principal Problem:   Atrial fibrillation with RVR (HCC) Active Problems:   Dyspnea   Bilateral leg edema   History of DVT of lower extremity   Essential hypertension, benign   Hypothyroidism   Neoplasm of right breast, primary tumor staging category Tis: ductal carcinoma in situ (DCIS)   Acute CHF ([place])   Lobar pneumonia, unspecified organism (HCC)   Leukocytosis   Hypokalemia   Malnutrition of moderate degree   Mitral valve prolapse  DVT prophylaxis: On systemic anticoagulation Code Status: Full code Family Communication: No family present at bedside Disposition Plan: Likely discharge home in 1-2 days. Patient is not clinically improving. Getting CT scan of the chest today.    Consultants:   Cardiology  Procedures: Echo Antimicrobials:  Cefepime 11/27/16 -->  Vanco 11/27/16 --> 11/29/2016 Subjective: Patient was seen and examined at bedside. Patient reported generalized weakness and no improvement in her shortness of breath and dry cough. She does not have phlegm. Denied chest pain, nausea vomiting or fever.  Objective: Vitals:   12/05/16 0747 12/05/16 1341 12/05/16 2032 12/06/16 0647  BP: 117/77 117/79 135/77 128/75  Pulse: 99 81 77 65  Resp:  18 18 18   Temp:  98 F (36.7 C) 98 F (36.7 C) 98 F (36.7 C)  TempSrc:  Oral Oral Oral  SpO2:  98% 95% 95%  Weight:    46.8 kg (103 lb 1.6 oz)  Height:        Intake/Output Summary (Last 24 hours) at 12/06/16 1228 Last data filed at 12/05/16 1800  Gross per 24 hour  Intake              540 ml    Output                0 ml  Net              540 ml   Filed Weights   12/04/16 0735 12/05/16 0500 12/06/16 0647  Weight: 48.5 kg (107 lb) 47.6 kg (105 lb) 46.8 kg (103 lb 1.6 oz)    Examination:  General exam: Appears calm and comfortable  Respiratory system: Bibasal crackles, no wheezing. Respiratory effort normal.  Cardiovascular system: S1 & S2 heard, RRR.  No pedal edema. Gastrointestinal system: Abdomen is nondistended, soft and nontender. Normal bowel sounds heard. Central nervous system: Alert and oriented. No focal neurological deficits. Extremities: Symmetric 5 x 5 power. Skin: No rashes, lesions or ulcers Psychiatry: Judgement and insight appear normal. Mood & affect appropriate.     Data Reviewed: I have personally reviewed following labs and imaging studies  CBC:  Recent Labs Lab 12/02/16 0234 12/03/16 0246 12/04/16 0248 12/05/16 0330 12/06/16 0339  WBC 9.2 9.2 8.1 8.7 8.6  HGB 12.9 12.6 12.6 12.9 13.6  HCT 38.0 36.6 37.1 38.6 39.7  MCV 83.7 84.1 83.4 83.9 83.4  PLT 271 285 280 306 [id number]   Basic Metabolic Panel:  Recent Labs Lab 12/01/16 0226 12/02/16 0234 12/03/16 0246 12/04/16 0248 12/05/16 0330  NA 133* 136 134* 133* 136  K 3.9 3.9 4.2 4.1 4.3  CL 99* 101 99* 101 101  CO2 26 29 27 26 28   GLUCOSE 96 103* 93 109* 96  BUN 20 16 14 17 20   CREATININE 0.59 0.72 0.65 0.67 0.67  CALCIUM 8.3* 8.5* 8.4* 8.6* 8.8*   GFR: Estimated Creatinine Clearance: 39.5 mL/min (by C-G formula based on SCr of 0.67 mg/dL). Liver Function Tests: No results for input(s): AST, ALT, ALKPHOS, BILITOT, PROT, ALBUMIN in the last 168 hours. No results for input(s): LIPASE, AMYLASE in the last 168 hours. No results for input(s): AMMONIA in the last 168 hours. Coagulation Profile:  Recent Labs Lab 12/04/16 0248  INR 1.49   Cardiac Enzymes: No results for input(s): CKTOTAL, CKMB, CKMBINDEX, TROPONINI in the last 168 hours. BNP (last 3 results) No results for  input(s): PROBNP in the last 8760 hours. HbA1C: No results for input(s): HGBA1C in the last 72 hours. CBG: No results for input(s): GLUCAP in the last 168 hours. Lipid Profile: No results for input(s): CHOL, HDL, LDLCALC, TRIG, CHOLHDL, LDLDIRECT in the last 72 hours. Thyroid Function Tests: No results for input(s): TSH, T4TOTAL, FREET4, T3FREE, THYROIDAB in the last 72 hours. Anemia Panel: No results for input(s): VITAMINB12, FOLATE, FERRITIN, TIBC, IRON, RETICCTPCT in the last 72 hours. Sepsis Labs: No results for input(s): PROCALCITON, LATICACIDVEN in the last 168 hours.  Recent Results (from the past 240 hour(s))  Blood culture (routine x 2)     Status: None   Collection Time: 11/27/16  6:24 PM  Result Value Ref Range Status   Specimen Description BLOOD LEFT FOREARM  Final   Special Requests BOTTLES DRAWN AEROBIC AND  ANAEROBIC 5CC  Final   Culture NO GROWTH 5 DAYS  Final   Report Status 12/02/2016 FINAL  Final  Blood culture (routine x 2)     Status: None   Collection Time: 11/27/16  6:41 PM  Result Value Ref Range Status   Specimen Description BLOOD LEFT HAND  Final   Special Requests BOTTLES DRAWN AEROBIC AND ANAEROBIC 5CC  Final   Culture NO GROWTH 5 DAYS  Final   Report Status 12/02/2016 FINAL  Final  MRSA PCR Screening     Status: None   Collection Time: 11/28/16  6:25 AM  Result Value Ref Range Status   MRSA by PCR NEGATIVE NEGATIVE Final    Comment:        The GeneXpert MRSA Assay (FDA approved for NASAL specimens only), is one component of a comprehensive MRSA colonization surveillance program. It is not intended to diagnose MRSA infection nor to guide or monitor treatment for MRSA infections.          Radiology Studies: Dg Chest Port 1 View  Result Date: 12/04/2016 CLINICAL DATA:  Pneumonia EXAM: PORTABLE CHEST 1 VIEW COMPARISON:  11/29/2016 FINDINGS: Patient is rotated to the left. There is cardiomegaly with vascular congestion heel bilateral  airspace opacities are noted, right greater than left. This could represent asymmetric edema or infection. Airspace disease may have worsened in the right lung since prior study. Stable small right effusion. No acute bony abnormality. IMPRESSION: Asymmetric bilateral airspace disease, right greater than left, slightly worsened since prior study. Small right effusion. Electronically Signed   By: Rolm Baptise M.D.   On: 12/04/2016 15:06        Scheduled Meds: . amitriptyline  50 mg Oral QHS  . apixaban  5 mg Oral BID  . ceFEPime (MAXIPIME) IV  1 g Intravenous Q24H  . cholecalciferol  1,000 Units Oral Daily  . darifenacin  7.5 mg Oral Daily  . diltiazem  300 mg Oral Daily  . feeding supplement (ENSURE ENLIVE)  237 mL Oral BID BM  . flecainide  50 mg Oral Q12H  . hydrocortisone cream   Topical QHS  . levothyroxine  112 mcg Oral QAC breakfast  . multivitamin with minerals  1 tablet Oral Daily  . pantoprazole  40 mg Oral Daily  . pravastatin  20 mg Oral QPM  . senna-docusate  2 tablet Oral QHS  . sodium chloride flush  3 mL Intravenous Q12H   Continuous Infusions:   LOS: 9 days    Dron Tanna Furry, MD [facility] Pager [phone]  If 7PM-7AM, please contact night-coverage [email or web address] Password [facility] 12/06/2016, 12:28 PM

## 2016-12-06 NOTE — Care Management Important Message (Signed)
Important Message  Patient Details  Name: [name] MRN: [id number] Date of Birth: Aug 04, 1936   Medicare Important Message Given:  Yes    Rosalind Guido Abena 12/06/2016, 11:29 AM

## 2016-12-07 DIAGNOSIS — R42 Dizziness and giddiness: Secondary | ICD-10-CM

## 2016-12-07 MED ORDER — FUROSEMIDE 10 MG/ML IJ SOLN: 40.0000 mg | Freq: Once | INTRAMUSCULAR | Status: DC

## 2016-12-07 NOTE — Progress Notes (Signed)
PT Cancellation Note  Patient Details Name: [name] MRN: [id number] DOB: 1936-03-02   Cancelled Treatment:    Reason Eval/Treat Not Completed: Medical issues which prohibited therapy (light headed and nauseated from "meds" per pt).  Will try later as time and pt allow.   Ramond Dial 12/07/2016, 12:28 PM   Mee Hives, PT MS Acute Rehab Dept. Number: [place] and [place]

## 2016-12-07 NOTE — Care Management Note (Signed)
Case Management Note Previous CM note initiated by Lacretia Leigh, RN 11/30/2016, 9:22 AM   Patient Details  Name: [name] MRN: [id number] Date of Birth: January 25, 1936  Subjective/Objective:     Adm w at fib               Action/Plan:plans to return home w hhc   Expected Discharge Date:                  Expected Discharge Plan:  [place]  In-House Referral:     Discharge planning Services  CM Consult, Medication Assistance  Post Acute Care Choice:  Home Health Choice offered to:  Patient  DME Arranged:    DME Agency:     HH Arranged:  PT, RN, OT, Nurse's Aide Aurelia Agency:  [facility]  Status of Service:  Completed, signed off  If discussed at [place] of Stay Meetings, dates discussed:  1/23, 1/25  Additional Comments:   12/07/16- 1415- Marvetta Gibbons RN, [place] continues to be home with [facility] when pt ready for discharge- referral has been made to [facility] for HH-RN/PT/OT/aide/SWSantiago Glad with [facility] following for d/c.  Pt s/p TEE and continues tx for pna with IV abx  Lacretia Leigh, RN 11/30/2016, 9:22 AM--gave pt 30day free eliquis card. copay is 47.00 per month thereafter. Went over [employer] and [facility]. phy there rec hhpt at [place]. Ref to donna w [facility] for hhpt.  Dahlia Client [place], RN 12/07/2016, 2:15 PM [phone]

## 2016-12-07 NOTE — Progress Notes (Signed)
PROGRESS NOTE    [name]  [id number] DOB: Oct 30, 1936 DOA: 11/27/2016 PCP: [name], MD  Subjective: Patient reported dizziness and lightheadedness.  Brief Narrative: 81 y.o.femalewith history of osteoporosis, dyslipidemia, hypertension, hypothyroidism, breast Ca (lumpx/ XRT 2012-3), history of DVT in 2012, anxiety. She presented to ED with worsening shortness of breath and fatigue for past 3 weeks prior to this admission. No reports of chest pan. On admission, she was in A fib with RVR. CXR showed pulmonary infiltrates, right more than left concerning for pneumonia. Cardiology consulted for Afib with RVR. Pt started on IV Cardizem.   Assessment & Plan:   Acute respiratory failure with hypoxia / Lobar pneumonia, right upper and lower lobe unspecified organism  - CXR 1/23 showed bilateral airspace disease right greater than left worsened since prior study. The patient reported no improvement in her shortness of breath and cough. The cough is worsening which is dry cough.  -Patient is on cefepime. I will discontinue cefepime and changed to levaquin for atypical coverage as well.  -Patient reported dizziness and lightheadedness since started on Mucinex, will discontinue. Check orthostatic blood pressure. -CT scan of the chest showed emphysema, probable bronchiectasis and mild effusion. No fever or leukocytosis. -Continue ambulation he is doing okay, be discharged in a.m.  Atrial fibrillation with RVR -Continue aspirin and eliquis. Pt required cardizem drip on admission. Switched to oral cardizem 300 mg and started flecainide by Cardiologist. Rate is currently controlled. Recommended outpatient follow-up with cardiologist.  Moderate to severe mitral regurgitation - TEE done 1/23, per cardio no need for surgery and can be managed conservatively with medical management ( as per prior note) - She may require mitral valve repair with MAZE in the future  Presumed acute  on chronic diastolic CHF - BNP on admission 443. 2 D ECHO with preserved EF - Was on lasix 20 mg IV BID. Currently off Lasix. Continue low-salt diet. Does not look fluid overloaded on exam.  LLE DVT in 2012 - Continue AC with eliquis   GERD without esophagitis - Continue protonix   Anxiety  - Continue amitriptyline   Dyslipidemia - Continue Pravachol  Hypothyroidism - Continue synthroid  - TSH WNL on 11/27/16  Possible moderate protein calorie malnutrition - In the setting of chronic illness - Continue nutritional supplementation    Principal Problem:   Atrial fibrillation with RVR (HCC) Active Problems:   Dyspnea   Bilateral leg edema   History of DVT of lower extremity   Essential hypertension, benign   Hypothyroidism   Neoplasm of right breast, primary tumor staging category Tis: ductal carcinoma in situ (DCIS)   Acute CHF ([place])   Lobar pneumonia, unspecified organism (HCC)   Leukocytosis   Hypokalemia   Malnutrition of moderate degree   Mitral valve prolapse  DVT prophylaxis: On systemic anticoagulation Code Status: Full code Family Communication: No family present at bedside Disposition Plan: Likely home in a.m., need re-eval with PT as she mentioned new lightheadedness.   Consultants:   Cardiology  Procedures: Echo Antimicrobials:  Cefepime 11/27/16 -->  Vanco 11/27/16 --> 11/29/2016  Objective: Vitals:   12/06/16 0647 12/06/16 1420 12/06/16 1931 12/07/16 0630  BP: 128/75 104/64 116/72 117/66  Pulse: 65 77 75 72  Resp: 18 19 19 19   Temp: 98 F (36.7 C) 98 F (36.7 C) 97.7 F (36.5 C) 98.5 F (36.9 C)  TempSrc: Oral Oral Oral Oral  SpO2: 95% 100% 97% 95%  Weight: 46.8 kg (103 lb 1.6 oz)  46.7 kg (102 lb 14.4 oz)  Height:        Intake/Output Summary (Last 24 hours) at 12/07/16 1248 Last data filed at 12/07/16 0908  Gross per 24 hour  Intake              240 ml  Output                0 ml  Net              240 ml   Filed Weights    12/05/16 0500 12/06/16 0647 12/07/16 0630  Weight: 47.6 kg (105 lb) 46.8 kg (103 lb 1.6 oz) 46.7 kg (102 lb 14.4 oz)    Examination:  General exam: Appears calm and comfortable  Respiratory system: Bibasal crackles, no wheezing. Respiratory effort normal.  Cardiovascular system: S1 & S2 heard, RRR.  No pedal edema. Gastrointestinal system: Abdomen is nondistended, soft and nontender. Normal bowel sounds heard. Central nervous system: Alert and oriented. No focal neurological deficits. Extremities: Symmetric 5 x 5 power. Skin: No rashes, lesions or ulcers Psychiatry: Judgement and insight appear normal. Mood & affect appropriate.     Data Reviewed: I have personally reviewed following labs and imaging studies  CBC:  Recent Labs Lab 12/02/16 0234 12/03/16 0246 12/04/16 0248 12/05/16 0330 12/06/16 0339  WBC 9.2 9.2 8.1 8.7 8.6  HGB 12.9 12.6 12.6 12.9 13.6  HCT 38.0 36.6 37.1 38.6 39.7  MCV 83.7 84.1 83.4 83.9 83.4  PLT 271 285 280 306 [id number]   Basic Metabolic Panel:  Recent Labs Lab 12/01/16 0226 12/02/16 0234 12/03/16 0246 12/04/16 0248 12/05/16 0330  NA 133* 136 134* 133* 136  K 3.9 3.9 4.2 4.1 4.3  CL 99* 101 99* 101 101  CO2 26 29 27 26 28   GLUCOSE 96 103* 93 109* 96  BUN 20 16 14 17 20   CREATININE 0.59 0.72 0.65 0.67 0.67  CALCIUM 8.3* 8.5* 8.4* 8.6* 8.8*   GFR: Estimated Creatinine Clearance: 39.5 mL/min (by C-G formula based on SCr of 0.67 mg/dL). Liver Function Tests: No results for input(s): AST, ALT, ALKPHOS, BILITOT, PROT, ALBUMIN in the last 168 hours. No results for input(s): LIPASE, AMYLASE in the last 168 hours. No results for input(s): AMMONIA in the last 168 hours. Coagulation Profile:  Recent Labs Lab 12/04/16 0248  INR 1.49   Cardiac Enzymes: No results for input(s): CKTOTAL, CKMB, CKMBINDEX, TROPONINI in the last 168 hours. BNP (last 3 results) No results for input(s): PROBNP in the last 8760 hours. HbA1C: No results for  input(s): HGBA1C in the last 72 hours. CBG: No results for input(s): GLUCAP in the last 168 hours. Lipid Profile: No results for input(s): CHOL, HDL, LDLCALC, TRIG, CHOLHDL, LDLDIRECT in the last 72 hours. Thyroid Function Tests: No results for input(s): TSH, T4TOTAL, FREET4, T3FREE, THYROIDAB in the last 72 hours. Anemia Panel: No results for input(s): VITAMINB12, FOLATE, FERRITIN, TIBC, IRON, RETICCTPCT in the last 72 hours. Sepsis Labs: No results for input(s): PROCALCITON, LATICACIDVEN in the last 168 hours.  Recent Results (from the past 240 hour(s))  Blood culture (routine x 2)     Status: None   Collection Time: 11/27/16  6:24 PM  Result Value Ref Range Status   Specimen Description BLOOD LEFT FOREARM  Final   Special Requests BOTTLES DRAWN AEROBIC AND ANAEROBIC 5CC  Final   Culture NO GROWTH 5 DAYS  Final   Report Status 12/02/2016 FINAL  Final  Blood culture (routine x 2)  Status: None   Collection Time: 11/27/16  6:41 PM  Result Value Ref Range Status   Specimen Description BLOOD LEFT HAND  Final   Special Requests BOTTLES DRAWN AEROBIC AND ANAEROBIC 5CC  Final   Culture NO GROWTH 5 DAYS  Final   Report Status 12/02/2016 FINAL  Final  MRSA PCR Screening     Status: None   Collection Time: 11/28/16  6:25 AM  Result Value Ref Range Status   MRSA by PCR NEGATIVE NEGATIVE Final    Comment:        The GeneXpert MRSA Assay (FDA approved for NASAL specimens only), is one component of a comprehensive MRSA colonization surveillance program. It is not intended to diagnose MRSA infection nor to guide or monitor treatment for MRSA infections.          Radiology Studies: Ct Chest Wo Contrast  Result Date: 12/06/2016 CLINICAL DATA:  Worsening shortness of breath for 3 weeks. EXAM: CT CHEST WITHOUT CONTRAST TECHNIQUE: Multidetector CT imaging of the chest was performed following the standard protocol without IV contrast. COMPARISON:  09/04/2015 FINDINGS:  Cardiovascular: Heart is enlarged. Coronary artery calcification is noted. Ascending thoracic aorta 4.7 cm diameter. Mediastinum/Nodes: No mediastinal lymphadenopathy. No evidence for gross hilar lymphadenopathy although assessment is limited by the lack of intravenous contrast on today's study. The esophagus has normal imaging features. Large hiatal hernia. There is no axillary lymphadenopathy. Lungs/Pleura: Moderate changes emphysema. Scattered areas of bronchiectasis with interstitial scarring and volume loss. Posterior right lower lobe collapse/consolidation. Small moderate right pleural effusion. Lingular scarring noted. Upper Abdomen: Unremarkable. Musculoskeletal: Bone windows reveal no worrisome lytic or sclerotic osseous lesions. Multiple thoracic compression fractures, some of which have been treated. IMPRESSION: 1. Moderate emphysema. 2. Scattered areas of bronchiectasis and interstitial scarring/volume loss. 3. Small moderate right pleural effusion. 4. 4.7 cm ascending thoracic aortic aneurysm. Recommend semi-annual imaging followup by CTA or MRA and referral to cardiothoracic surgery if not already obtained. This recommendation follows 2010 ACCF/AHA/AATS/ACR/ASA/SCA/SCAI/SIR/STS/SVM Guidelines for the Diagnosis and Management of Patients With Thoracic Aortic Disease. Circulation. 2010; 121SP:1689793. Electronically Signed   By: Misty Stanley M.D.   On: 12/06/2016 19:45        Scheduled Meds: . amitriptyline  50 mg Oral QHS  . apixaban  5 mg Oral BID  . cholecalciferol  1,000 Units Oral Daily  . darifenacin  7.5 mg Oral Daily  . dextromethorphan-guaiFENesin  1 tablet Oral BID  . diltiazem  300 mg Oral Daily  . feeding supplement (ENSURE ENLIVE)  237 mL Oral BID BM  . flecainide  50 mg Oral Q12H  . hydrocortisone cream   Topical QHS  . levofloxacin  500 mg Oral Daily  . levothyroxine  112 mcg Oral QAC breakfast  . multivitamin with minerals  1 tablet Oral Daily  . pantoprazole  40 mg  Oral Daily  . pravastatin  20 mg Oral QPM  . senna-docusate  2 tablet Oral QHS  . sodium chloride flush  3 mL Intravenous Q12H   Continuous Infusions:   LOS: 10 days    Birdie Hopes, MD [facility] Pager [phone]  If 7PM-7AM, please contact night-coverage [email or web address] Password [facility] 12/07/2016, 12:48 PM

## 2016-12-07 NOTE — Progress Notes (Addendum)
Nutrition Follow Up  DOCUMENTATION CODES:   Non-severe (moderate) malnutrition in context of social or environmental circumstances  INTERVENTION:    Continue Ensure Enlive po BID, each supplement provides 350 kcal and 20 grams of protein  NUTRITION DIAGNOSIS:   Malnutrition related to poor appetite, social / environmental circumstances as evidenced by mild depletion of muscle mass, energy intake < 75% for > or equal to 3 months, moderate depletions of muscle mass, ongoing  GOAL:   Patient will meet greater than or equal to 90% of their needs, progressing  MONITOR:   PO intake, Supplement acceptance, Labs, Weight trends, Skin, I & O's  ASSESSMENT:   81 y.o. female presenting with afib with RVR. 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   Pt s/p TEE 1/23. Continues on a Heart Healthy diet. PO intake variable at 10-75% per flowsheet records. Internal Medicine note reviewed >> SOB and dry cough ongoing. Receiving Ensure Enlive nutrition supplements TID.  Diet Order:  Diet Heart Room service appropriate? Yes; Fluid consistency: Thin  Skin:  Reviewed, no issues  Last BM:  1/25  Height:   Ht Readings from Last 1 Encounters:  12/04/16 4\' 11"  (1.499 m)    Weight:   Wt Readings from Last 1 Encounters:  12/07/16 102 lb 14.4 oz (46.7 kg)    Ideal Body Weight:  44.7 kg  BMI:  Body mass index is 20.78 kg/m.  Estimated Nutritional Needs:   Kcal:  1300-1500  Protein:  65-75 grams  Fluid:  >/= 1.5 L  EDUCATION NEEDS:   No education needs identified at this time  Arthur Holms, RD, LDN Pager #: [phone] After-Hours Pager #: [phone]

## 2016-12-07 NOTE — Progress Notes (Signed)
Patient states that she is feeling a little better than this morning, but is still a little dizzy. Advised not to get up unless she calls someone. Bed alarm on, call light within reach

## 2016-12-08 LAB — BASIC METABOLIC PANEL
ANION GAP: 7 (ref 5–15)
BUN: 21 mg/dL — ABNORMAL HIGH (ref 6–20)
CALCIUM: 8.7 mg/dL — AB (ref 8.9–10.3)
CHLORIDE: 100 mmol/L — AB (ref 101–111)
CO2: 25 mmol/L (ref 22–32)
Creatinine, Ser: 0.66 mg/dL (ref 0.44–1.00)
GFR calc non Af Amer: >60 mL/min (ref >60)
GLUCOSE: 93 mg/dL (ref 65–99)
POTASSIUM: 4.3 mmol/L (ref 3.5–5.1)
Sodium: 132 mmol/L — ABNORMAL LOW (ref 135–145)

## 2016-12-08 LAB — CBC
HEMATOCRIT: 37.1 % (ref 36.0–46.0)
HEMOGLOBIN: 12.6 g/dL (ref 12.0–15.0)
MCH: 28.3 pg (ref 26.0–34.0)
MCHC: 34 g/dL (ref 30.0–36.0)
MCV: 83.4 fL (ref 78.0–100.0)
Platelets: 279 10*3/uL (ref 150–400)
RBC: 4.45 MIL/uL (ref 3.87–5.11)
RDW: 13.5 % (ref 11.5–15.5)
WBC: 8.7 10*3/uL (ref 4.0–10.5)

## 2016-12-08 MED ORDER — LEVALBUTEROL HCL 1.25 MG/0.5ML IN NEBU
1.2500 mg | INHALATION_SOLUTION | Freq: Four times a day (QID) | RESPIRATORY_TRACT | Status: DC | PRN
  Filled 2016-12-08: qty 0.5

## 2016-12-08 NOTE — Progress Notes (Signed)
PROGRESS NOTE    [name]  [id number] DOB: February 28, 1936 DOA: 11/27/2016 PCP: [name], MD  Subjective: Patient reported dizziness and lightheadedness.  Brief Narrative:  81 y.o.femalewith history of osteoporosis, dyslipidemia, hypertension, hypothyroidism, breast Ca (lumpx/ XRT 2012-3), history of DVT in 2012, anxiety. She presented to ED with worsening shortness of breath and fatigue for past 3 weeks prior to this admission. No reports of chest pan. On admission, she was in A fib with RVR. CXR showed pulmonary infiltrates, right more than left concerning for pneumonia. Cardiology consulted for Afib with RVR. Pt started on IV Cardizem.   Assessment & Plan:   Acute respiratory failure with hypoxia / Lobar pneumonia, right upper and lower lobe unspecified organism  - CXR 1/23 showed bilateral airspace disease right greater than left worsened since prior study - pt reports feeling better but still will some exertional dizziness and unsteady gait  - Maxpime has been changed to Levaquin - CT scan of the chest showed emphysema, probable bronchiectasis and mild effusion. No fever or leukocytosis. - Continue ambulation as pt able to tolerate - PT eval requested   Atrial fibrillation with RVR - Continue aspirin and eliquis - Pt required cardizem drip on admission. Switched to oral cardizem 300 mg and started flecainide by Cardiologist. - rate controlled at this time  Moderate to severe mitral regurgitation - TEE done 1/23, per cardio no need for surgery and can be managed conservatively with medical management  - She may require mitral valve repair with MAZE in the future  Presumed acute on chronic diastolic CHF - BNP on admission 443. 2 D ECHO with preserved EF - Was on lasix 20 mg IV BID. Currently off Lasix. Continue low-salt diet. Does not look fluid overloaded on exam. - weight trend in the past 72 hours: 103 --> 102 --> 98 lbs this AM - continue to monitor  daily weight, strict I/O  LLE DVT in 2012 - Continue AC with eliquis   GERD without esophagitis - Continue protonix   Anxiety  - Continue amitriptyline   Dyslipidemia - Continue Pravachol  Hypothyroidism - Continue synthroid  - TSH WNL on 11/27/16  Possible moderate protein calorie malnutrition - In the setting of chronic illness - Continue nutritional supplementation   DVT prophylaxis: On systemic anticoagulation Code Status: Full code Family Communication: No family present at bedside Disposition Plan: Likely home in 1-2 days, PT eval pending   Consultants:   Cardiology  Procedures: Echo Antimicrobials:  Cefepime 11/27/16 --> changed to [place] 11/27/16 --> 11/29/2016  Objective: Vitals:   12/07/16 1940 12/08/16 0441 12/08/16 0622 12/08/16 0939  BP: 122/77 115/62  110/71  Pulse: 89 63    Resp: 20 20    Temp: 98.6 F (37 C) 97.5 F (36.4 C)    TempSrc: Oral Oral    SpO2: 98% 97%    Weight:   44.8 kg (98 lb 12.8 oz)   Height:       No intake or output data in the 24 hours ending 12/08/16 1257 Filed Weights   12/06/16 0647 12/07/16 0630 12/08/16 0622  Weight: 46.8 kg (103 lb 1.6 oz) 46.7 kg (102 lb 14.4 oz) 44.8 kg (98 lb 12.8 oz)    Examination:  General exam: Appears calm and comfortable  Respiratory system: Bibasal crackles, no wheezing. Respiratory effort normal.  Cardiovascular system: S1 & S2 heard, RRR.  No pedal edema. Gastrointestinal system: Abdomen is nondistended, soft and nontender. Normal bowel sounds heard. Central  nervous system: Alert and oriented. No focal neurological deficits. Extremities: Symmetric 5 x 5 power. Skin: No rashes, lesions or ulcers Psychiatry: Judgement and insight appear normal. Mood & affect appropriate.   Data Reviewed: I have personally reviewed following labs and imaging studies  CBC:  Recent Labs Lab 12/03/16 0246 12/04/16 0248 12/05/16 0330 12/06/16 0339 12/08/16 0337  WBC 9.2 8.1 8.7  8.6 8.7  HGB 12.6 12.6 12.9 13.6 12.6  HCT 36.6 37.1 38.6 39.7 37.1  MCV 84.1 83.4 83.9 83.4 83.4  PLT 285 280 306 308 [id number]   Basic Metabolic Panel:  Recent Labs Lab 12/02/16 0234 12/03/16 0246 12/04/16 0248 12/05/16 0330 12/08/16 0337  NA 136 134* 133* 136 132*  K 3.9 4.2 4.1 4.3 4.3  CL 101 99* 101 101 100*  CO2 29 27 26 28 25   GLUCOSE 103* 93 109* 96 93  BUN 16 14 17 20  21*  CREATININE 0.72 0.65 0.67 0.67 0.66  CALCIUM 8.5* 8.4* 8.6* 8.8* 8.7*   Coagulation Profile:  Recent Labs Lab 12/04/16 0248  INR 1.49   Radiology Studies: Ct Chest Wo Contrast  Result Date: 12/06/2016 CLINICAL DATA:  Worsening shortness of breath for 3 weeks. EXAM: CT CHEST WITHOUT CONTRAST TECHNIQUE: Multidetector CT imaging of the chest was performed following the standard protocol without IV contrast. COMPARISON:  09/04/2015 FINDINGS: Cardiovascular: Heart is enlarged. Coronary artery calcification is noted. Ascending thoracic aorta 4.7 cm diameter. Mediastinum/Nodes: No mediastinal lymphadenopathy. No evidence for gross hilar lymphadenopathy although assessment is limited by the lack of intravenous contrast on today's study. The esophagus has normal imaging features. Large hiatal hernia. There is no axillary lymphadenopathy. Lungs/Pleura: Moderate changes emphysema. Scattered areas of bronchiectasis with interstitial scarring and volume loss. Posterior right lower lobe collapse/consolidation. Small moderate right pleural effusion. Lingular scarring noted. Upper Abdomen: Unremarkable. Musculoskeletal: Bone windows reveal no worrisome lytic or sclerotic osseous lesions. Multiple thoracic compression fractures, some of which have been treated. IMPRESSION: 1. Moderate emphysema. 2. Scattered areas of bronchiectasis and interstitial scarring/volume loss. 3. Small moderate right pleural effusion. 4. 4.7 cm ascending thoracic aortic aneurysm. Recommend semi-annual imaging followup by CTA or MRA and referral to  cardiothoracic surgery if not already obtained. This recommendation follows 2010 ACCF/AHA/AATS/ACR/ASA/SCA/SCAI/SIR/STS/SVM Guidelines for the Diagnosis and Management of Patients With Thoracic Aortic Disease. Circulation. 2010; [id number]. Electronically Signed   By: Misty Stanley M.D.   On: 12/06/2016 19:45     Scheduled Meds: . amitriptyline  50 mg Oral QHS  . apixaban  5 mg Oral BID  . cholecalciferol  1,000 Units Oral Daily  . darifenacin  7.5 mg Oral Daily  . dextromethorphan-guaiFENesin  1 tablet Oral BID  . diltiazem  300 mg Oral Daily  . feeding supplement (ENSURE ENLIVE)  237 mL Oral BID BM  . flecainide  50 mg Oral Q12H  . hydrocortisone cream   Topical QHS  . levofloxacin  500 mg Oral Daily  . levothyroxine  112 mcg Oral QAC breakfast  . multivitamin with minerals  1 tablet Oral Daily  . pantoprazole  40 mg Oral Daily  . pravastatin  20 mg Oral QPM  . senna-docusate  2 tablet Oral QHS  . sodium chloride flush  3 mL Intravenous Q12H   Continuous Infusions:   LOS: 11 days   Faye Ramsay, MD [facility] Pager [phone]  If 7PM-7AM, please contact night-coverage [email or web address] Password [facility] 12/08/2016, 12:57 PM

## 2016-12-09 LAB — CBC
HCT: 37 % (ref 36.0–46.0)
Hemoglobin: 12.8 g/dL (ref 12.0–15.0)
MCH: 28.6 pg (ref 26.0–34.0)
MCHC: 34.6 g/dL (ref 30.0–36.0)
MCV: 82.8 fL (ref 78.0–100.0)
PLATELETS: 298 10*3/uL (ref 150–400)
RBC: 4.47 MIL/uL (ref 3.87–5.11)
RDW: 13.6 % (ref 11.5–15.5)
WBC: 7.9 10*3/uL (ref 4.0–10.5)

## 2016-12-09 LAB — BASIC METABOLIC PANEL
Anion gap: 8 (ref 5–15)
BUN: 23 mg/dL — AB (ref 6–20)
CALCIUM: 8.8 mg/dL — AB (ref 8.9–10.3)
CO2: 27 mmol/L (ref 22–32)
Chloride: 100 mmol/L — ABNORMAL LOW (ref 101–111)
Creatinine, Ser: 0.71 mg/dL (ref 0.44–1.00)
GFR calc Af Amer: >60 mL/min (ref >60)
GLUCOSE: 91 mg/dL (ref 65–99)
Potassium: 4 mmol/L (ref 3.5–5.1)
SODIUM: 135 mmol/L (ref 135–145)

## 2016-12-09 MED ORDER — DILTIAZEM HCL ER COATED BEADS 300 MG PO CP24: 300.0000 mg | ORAL_CAPSULE | Freq: Every day | ORAL | 0 refills | Status: DC

## 2016-12-09 MED ORDER — FLECAINIDE ACETATE 50 MG PO TABS: 50.0000 mg | ORAL_TABLET | Freq: Two times a day (BID) | ORAL | 0 refills | Status: DC

## 2016-12-09 MED ORDER — APIXABAN 5 MG PO TABS: 5.0000 mg | ORAL_TABLET | Freq: Two times a day (BID) | ORAL | 0 refills | Status: DC

## 2016-12-09 NOTE — Discharge Summary (Signed)
Physician Discharge Summary  [name] [id number] DOB: 06-11-1936 DOA: 11/27/2016  PCP: [name], MD  Admit date: 11/27/2016 Discharge date: 12/09/2016  Recommendations for Outpatient Follow-up:  1. Pt will need to follow up with PCP in 1-2 weeks post discharge 2. Please obtain BMP to evaluate electrolytes and kidney function 3. Please also check CBC to evaluate Hg and Hct levels 4. Apixaban started for atrial fibrillation, please note that Aspirin was stopped  5. Please note that Metoprolol was stopped per cardiology and pt placed on oral Cardizem 6. Pt also started on Flecainide per cardiology team   Discharge Diagnoses:  Principal Problem:   Atrial fibrillation with RVR ([place]) Active Problems:   Dyspnea   Bilateral leg edema   History of DVT of lower extremity   Essential hypertension, benign  Discharge Condition: Stable  Diet recommendation: Heart healthy diet discussed in details   Brief Narrative:  81 y.o.femalewith history of osteoporosis, dyslipidemia, hypertension, hypothyroidism, breast Ca (lumpx/ XRT 2012-3), history of DVT in 2012, anxiety. She presented to ED with worsening shortness of breath and fatigue for past 3 weeks prior to this admission. No reports of chest pan. On admission, she was in A fib with RVR. CXR showed pulmonary infiltrates, right more than left concerning for pneumonia. Cardiology consulted for Afib with RVR. Pt started on IV Cardizem.   Assessment & Plan:   Acute respiratory failure with hypoxia / Lobar pneumonia, right upper and lower lobe unspecified organism  - CXR 1/23 showed bilateral airspace disease right greater than left worsened since prior study - pt reports feeling better but still will some exertional dizziness and unsteady gait  - Maxpime has been changed to Levaquin, ABX course completed and no need for ABX upon discharge  - CT scan of the chest showed emphysema, probable bronchiectasis and mild effusion.  No fever or leukocytosis. - Continue ambulation as pt able to tolerate - PT eval requested, HH PT orders placed   Atrial fibrillation with RVR - Continue aspirin and eliquis - Pt required cardizem drip on admission. Switched to oral cardizem 300 mg and started flecainide by Cardiologist. - rate controlled at this time  Moderate to severe mitral regurgitation - TEE done 1/23, per cardio no need for surgery and can be managed conservatively with medical management  - She may require mitral valve repair with MAZE in the future  Presumed acute on chronic diastolic CHF - BNP on admission 443. 2 D ECHO with preserved EF - Was on lasix 20 mg IV BID. Currently off Lasix. Continue low-salt diet. Does not look fluid overloaded on exam. - weight trend in the past 72 hours: 103 --> 102 --> 98 lbs this AM  LLE DVT in 2012 - Continue AC with eliquis   GERD without esophagitis - Continue protonix   Anxiety  - Continue amitriptyline   Dyslipidemia - Continue home medical regimen   Hypothyroidism - Continue synthroid  - TSH WNL on 11/27/16  Possible moderate protein calorie malnutrition - In the setting of chronic illness - Continue nutritional supplementation   DVT prophylaxis: On systemic anticoagulation Code Status: Full code Family Communication: No family present at bedside Disposition Plan: Likely home in AM  Consultants:   Cardiology  Procedures:  ECHO  Antimicrobials:  Cefepime 11/27/16 --> changed to Levaquin  Vanco 1/16/  Procedures/Studies: Ct Chest Wo Contrast  Result Date: 12/06/2016 CLINICAL DATA:  Worsening shortness of breath for 3 weeks. EXAM: CT CHEST WITHOUT CONTRAST TECHNIQUE: Multidetector CT imaging  of the chest was performed following the standard protocol without IV contrast. COMPARISON:  09/04/2015 FINDINGS: Cardiovascular: Heart is enlarged. Coronary artery calcification is noted. Ascending thoracic aorta 4.7 cm diameter. Mediastinum/Nodes:  No mediastinal lymphadenopathy. No evidence for gross hilar lymphadenopathy although assessment is limited by the lack of intravenous contrast on today's study. The esophagus has normal imaging features. Large hiatal hernia. There is no axillary lymphadenopathy. Lungs/Pleura: Moderate changes emphysema. Scattered areas of bronchiectasis with interstitial scarring and volume loss. Posterior right lower lobe collapse/consolidation. Small moderate right pleural effusion. Lingular scarring noted. Upper Abdomen: Unremarkable. Musculoskeletal: Bone windows reveal no worrisome lytic or sclerotic osseous lesions. Multiple thoracic compression fractures, some of which have been treated. IMPRESSION: 1. Moderate emphysema. 2. Scattered areas of bronchiectasis and interstitial scarring/volume loss. 3. Small moderate right pleural effusion. 4. 4.7 cm ascending thoracic aortic aneurysm. Recommend semi-annual imaging followup by CTA or MRA and referral to cardiothoracic surgery if not already obtained. This recommendation follows 2010 ACCF/AHA/AATS/ACR/ASA/SCA/SCAI/SIR/STS/SVM Guidelines for the Diagnosis and Management of Patients With Thoracic Aortic Disease. Circulation. 2010; [id number]:[id number]. Electronically Signed   By: Misty Stanley M.D.   On: 12/06/2016 19:45   Dg Chest Port 1 View  Result Date: 12/04/2016 CLINICAL DATA:  Pneumonia EXAM: PORTABLE CHEST 1 VIEW COMPARISON:  11/29/2016 FINDINGS: Patient is rotated to the left. There is cardiomegaly with vascular congestion heel bilateral airspace opacities are noted, right greater than left. This could represent asymmetric edema or infection. Airspace disease may have worsened in the right lung since prior study. Stable small right effusion. No acute bony abnormality. IMPRESSION: Asymmetric bilateral airspace disease, right greater than left, slightly worsened since prior study. Small right effusion. Electronically Signed   By: Rolm Baptise M.D.   On: 12/04/2016 15:06   Dg  Chest Port 1 View  Result Date: 11/29/2016 CLINICAL DATA:  Cough, pneumonia EXAM: PORTABLE CHEST 1 VIEW COMPARISON:  11/27/2016 FINDINGS: Again noted asymmetric streaky airspace disease in right upper lobe and right lower lobe highly suspicious for pneumonia. Small left pleural effusion with left basilar atelectasis or infiltrate. Atherosclerotic calcifications of thoracic aorta. No convincing pulmonary edema. IMPRESSION: Again noted asymmetric streaky airspace disease in right upper lobe and right lower lobe highly suspicious for pneumonia. Small left pleural effusion with left basilar atelectasis or infiltrate. No pulmonary edema. Electronically Signed   By: Lahoma Crocker M.D.   On: 11/29/2016 12:43   Dg Chest Port 1 View  Result Date: 11/27/2016 CLINICAL DATA:  Shortness of breath and cough for 3 weeks. EXAM: PORTABLE CHEST 1 VIEW COMPARISON:  Single-view of the chest 09/10/2015. FINDINGS: Airspace disease is seen in the right upper and lower lung zones and left lung base. There is cardiomegaly. Aortic atherosclerosis is noted. No pneumothorax. There appear to be small bilateral pleural effusions. IMPRESSION: Right worse than left airspace disease most worrisome for pneumonia. Small bilateral pleural effusions. Cardiomegaly. Atherosclerosis. Electronically Signed   By: Inge Rise M.D.   On: 11/27/2016 12:06     Discharge Exam: Vitals:   12/09/16 1004 12/09/16 1156  BP: 119/80 127/78  Pulse:  86  Resp:  18  Temp:  97.7 F (36.5 C)   Vitals:   12/09/16 0321 12/09/16 0419 12/09/16 1004 12/09/16 1156  BP:  126/72 119/80 127/78  Pulse:  68  86  Resp:  20  18  Temp:  98 F (36.7 C)  97.7 F (36.5 C)  TempSrc:  Oral  Oral  SpO2:  97%  97%  Weight: 44.7  kg (98 lb 8 oz)     Height:        General: Pt is alert, follows commands appropriately, not in acute distress Cardiovascular: Regular rate and rhythm, S1/S2 +, no rubs, no gallops Respiratory: Clear to auscultation bilaterally, no  wheezing, no crackles, no rhonchi Abdominal: Soft, non tender, non distended, bowel sounds +, no guarding   Discharge Instructions   Allergies as of 12/09/2016      Reactions   Contrast Media [iodinated Diagnostic Agents] Shortness Of Breath   Iohexol Shortness Of Breath   Penicillins Rash   Has patient had a PCN reaction causing immediate rash, facial/tongue/throat swelling, SOB or lightheadedness with hypotension: No Has patient had a PCN reaction causing severe rash involving mucus membranes or skin necrosis: No Has patient had a PCN reaction that required hospitalization No Has patient had a PCN reaction occurring within the last 10 years: No If all of the above answers are "NO", then may proceed with Cephalosporin use.      Medication List    STOP taking these medications   aspirin 81 MG tablet   BC FAST PAIN RELIEF PO   metoprolol succinate 25 MG 24 hr tablet Commonly known as:  TOPROL-XL     TAKE these medications   amitriptyline 50 MG tablet Commonly known as:  ELAVIL Take 50 mg by mouth at bedtime.   amLODipine 5 MG tablet Commonly known as:  NORVASC Take 1 tablet (5 mg total) by mouth daily.   apixaban 5 MG Tabs tablet Commonly known as:  ELIQUIS Take 1 tablet (5 mg total) by mouth 2 (two) times daily.   BONIVA 150 MG tablet Generic drug:  ibandronate Take 150 mg by mouth every 30 (thirty) days.   calcium citrate-vitamin D 315-200 MG-UNIT tablet Commonly known as:  CITRACAL+D Take 1 tablet by mouth 2 (two) times daily.   cholecalciferol 1000 units tablet Commonly known as:  VITAMIN D Take 1,000 Units by mouth daily. Vitamin D3   diltiazem 300 MG 24 hr capsule Commonly known as:  CARDIZEM CD Take 1 capsule (300 mg total) by mouth daily. Start taking on:  12/10/2016   flecainide 50 MG tablet Commonly known as:  TAMBOCOR Take 1 tablet (50 mg total) by mouth every 12 (twelve) hours.   HYDROcodone-acetaminophen 5-325 MG tablet Commonly known as:   NORCO/VICODIN Take 1-2 tablets by mouth every 4 (four) hours as needed (mild pain).   levothyroxine 112 MCG tablet Commonly known as:  SYNTHROID, LEVOTHROID Take 112 mcg by mouth daily.   multivitamin tablet Take 1 tablet by mouth daily.   pravastatin 20 MG tablet Commonly known as:  PRAVACHOL Take 20 mg by mouth every evening.   PRILOSEC OTC 20 MG tablet Generic drug:  omeprazole Take 20 mg by mouth daily.   solifenacin 5 MG tablet Commonly known as:  VESICARE Take 5 mg by mouth daily.      Follow-up Information    [facility] Follow up.   Why:  HHRN/PT/OT/aide arranged- will call to set up home visits Contact information: [place] [address] [phone]        [name], MD Follow up.   Specialty:  Internal Medicine Contact information: [address] [place] [address] [phone]          The results of significant diagnostics from this hospitalization (including imaging, microbiology, ancillary and laboratory) are listed below for reference.    Microbiology: No results  found for this or any previous visit (from the past 240 hour(s)).   Labs: Basic Metabolic Panel:  Recent Labs Lab 12/03/16 0246 12/04/16 0248 12/05/16 0330 12/08/16 0337 12/09/16 0306  NA 134* 133* 136 132* 135  K 4.2 4.1 4.3 4.3 4.0  CL 99* 101 101 100* 100*  CO2 27 26 28 25 27   GLUCOSE 93 109* 96 93 91  BUN 14 17 20  21* 23*  CREATININE 0.65 0.67 0.67 0.66 0.71  CALCIUM 8.4* 8.6* 8.8* 8.7* 8.8*   CBC:  Recent Labs Lab 12/04/16 0248 12/05/16 0330 12/06/16 0339 12/08/16 0337 12/09/16 0306  WBC 8.1 8.7 8.6 8.7 7.9  HGB 12.6 12.9 13.6 12.6 12.8  HCT 37.1 38.6 39.7 37.1 37.0  MCV 83.4 83.9 83.4 83.4 82.8  PLT 280 306 308 279 298   BNP (last 3 results)  Recent Labs  11/27/16 2032  BNP 443.6*   SIGNED:  Time coordinating discharge: 30 minutes  Faye Ramsay, MD  [facility] 12/09/2016, 1:04 PM Pager [phone]  If 7PM-7AM, please contact night-coverage [email or web address] Password [email or web address]

## 2016-12-10 DIAGNOSIS — I480 Paroxysmal atrial fibrillation: Secondary | ICD-10-CM

## 2016-12-10 LAB — BASIC METABOLIC PANEL
ANION GAP: 7 (ref 5–15)
BUN: 17 mg/dL (ref 6–20)
CO2: 28 mmol/L (ref 22–32)
Calcium: 8.9 mg/dL (ref 8.9–10.3)
Chloride: 101 mmol/L (ref 101–111)
Creatinine, Ser: 0.73 mg/dL (ref 0.44–1.00)
GLUCOSE: 93 mg/dL (ref 65–99)
POTASSIUM: 4.1 mmol/L (ref 3.5–5.1)
SODIUM: 136 mmol/L (ref 135–145)

## 2016-12-10 LAB — CBC
HCT: 38.2 % (ref 36.0–46.0)
Hemoglobin: 13 g/dL (ref 12.0–15.0)
MCH: 28.3 pg (ref 26.0–34.0)
MCHC: 34 g/dL (ref 30.0–36.0)
MCV: 83 fL (ref 78.0–100.0)
PLATELETS: 289 10*3/uL (ref 150–400)
RBC: 4.6 MIL/uL (ref 3.87–5.11)
RDW: 13.7 % (ref 11.5–15.5)
WBC: 8.2 10*3/uL (ref 4.0–10.5)

## 2016-12-10 NOTE — Progress Notes (Signed)
PROGRESS NOTE                                                                                                                                                                                                             Patient Demographics:    [name], is a 81 y.o. female, DOB - 11-24-1935, [id number]  Admit date - 11/27/2016   Admitting Physician [name], MD  Outpatient Primary MD for the patient is [name], MD  LOS - 13  Outpatient Specialists:none  Chief Complaint  Patient presents with  . shortness of breath/ atrial fib       Brief Narrative   81 y.o.femalewith history of osteoporosis, dyslipidemia, hypertension, hypothyroidism, breast Ca (lumpx/ XRT 2012-3), history of DVT in 2012, anxiety. She presented to ED with worsening shortness of breath and fatigue for past 3 weeks prior to this admission. No reports of chest pan. On admission, she was in A fib with RVR. CXR showed pulmonary infiltrates, right more than left concerning for pneumonia. Cardiology consulted for Afib with RVR. Pt started on IV Cardizem.    Subjective:   Denies dizziness but feels weak. Stable on monitor overnight   Assessment  & Plan :    Principal Problem:   Atrial fibrillation with RVR ([facility]) now on eliquis, flecainide  and cardizem. Rater controlled. Follow up with cardiology as outpt  Active Problems:   Lobar pneumonia, unspecified organism ([place]) associated hypoxic resp failure. Completed abx. Stable on RA.   Acute on chronic diastolic CHF Resolved with diuretic. Euvolemic.  Marland Kitchen generalized weakness  HHPT  See discharge summary for details.  Code Status : full code  Family Communication  : none at bedside  Disposition Plan  : home    Lab Results  Component Value Date   PLT 289 12/10/2016    Antibiotics  :    Anti-infectives    Start     Dose/Rate Route Frequency Ordered Stop   12/06/16 1300  levofloxacin (LEVAQUIN) tablet 500 mg     500 mg Oral Daily 12/06/16 1241     11/27/16 1845  vancomycin (VANCOCIN) IVPB 750 mg/150 ml premix  Status:  Discontinued     750 mg 150 mL/hr over 60 Minutes Intravenous Every 24 hours 11/27/16 1808 11/29/16 1046   11/27/16 1830  ceFEPIme (MAXIPIME) 1 g in dextrose  5 % 50 mL IVPB  Status:  Discontinued     1 g 100 mL/hr over 30 Minutes Intravenous Every 24 hours 11/27/16 1806 12/06/16 1241        Objective:   Vitals:   12/09/16 1156 12/09/16 2111 12/10/16 0328 12/10/16 0933  BP: 127/78 127/78  109/77  Pulse: 86 70 70   Resp: 18 18 20    Temp: 97.7 F (36.5 C) 97.9 F (36.6 C) 98.2 F (36.8 C)   TempSrc: Oral Oral Oral   SpO2: 97% 96% 94%   Weight:   47.4 kg (104 lb 6.4 oz)   Height:        Wt Readings from Last 3 Encounters:  12/10/16 47.4 kg (104 lb 6.4 oz)  10/12/15 47.6 kg (105 lb)  09/04/15 47.9 kg (105 lb 9.6 oz)     Intake/Output Summary (Last 24 hours) at 12/10/16 1000 Last data filed at 12/09/16 1347  Gross per 24 hour  Intake              240 ml  Output                0 ml  Net              240 ml     Physical Exam  Gen: not in distress HEENT:  moist mucosa, supple neck Chest: clear b/l, no added sounds CVS: N S1&S2, no murmurs,  GI: soft, NT, ND, Musculoskeletal: warm, no edema     Data Review:    CBC  Recent Labs Lab 12/05/16 0330 12/06/16 0339 12/08/16 0337 12/09/16 0306 12/10/16 0313  WBC 8.7 8.6 8.7 7.9 8.2  HGB 12.9 13.6 12.6 12.8 13.0  HCT 38.6 39.7 37.1 37.0 38.2  PLT 306 308 279 298 289  MCV 83.9 83.4 83.4 82.8 83.0  MCH 28.0 28.6 28.3 28.6 28.3  MCHC 33.4 34.3 34.0 34.6 34.0  RDW 13.8 13.9 13.5 13.6 13.7    Chemistries   Recent Labs Lab 12/04/16 0248 12/05/16 0330 12/08/16 0337 12/09/16 0306 12/10/16 0313  NA 133* 136 132* 135 136  K 4.1 4.3 4.3 4.0 4.1  CL 101 101 100* 100* 101  CO2 26 28 25 27 28   GLUCOSE 109* 96 93 91 93  BUN 17 20 21* 23* 17    CREATININE 0.67 0.67 0.66 0.71 0.73  CALCIUM 8.6* 8.8* 8.7* 8.8* 8.9   ------------------------------------------------------------------------------------------------------------------ No results for input(s): CHOL, HDL, LDLCALC, TRIG, CHOLHDL, LDLDIRECT in the last 72 hours.  Lab Results  Component Value Date   HGBA1C  12/01/2010    5.4 (NOTE)                                                                       According to the ADA Clinical Practice Recommendations for 2011, when HbA1c is used as a screening test:   >=6.5%   Diagnostic of Diabetes Mellitus           (if abnormal result  is confirmed)  5.7-6.4%   Increased risk of developing Diabetes Mellitus  References:Diagnosis and Classification of Diabetes Mellitus,Diabetes [id number] 1):S62-S69 and Standards of Medical Care in         Diabetes - 2011,Diabetes Care,2011,34  (Suppl 1):S11-S61.   ------------------------------------------------------------------------------------------------------------------  No results for input(s): TSH, T4TOTAL, T3FREE, THYROIDAB in the last 72 hours.  Invalid input(s): FREET3 ------------------------------------------------------------------------------------------------------------------ No results for input(s): VITAMINB12, FOLATE, FERRITIN, TIBC, IRON, RETICCTPCT in the last 72 hours.  Coagulation profile  Recent Labs Lab 12/04/16 0248  INR 1.49    No results for input(s): DDIMER in the last 72 hours.  Cardiac Enzymes No results for input(s): CKMB, TROPONINI, MYOGLOBIN in the last 168 hours.  Invalid input(s): CK ------------------------------------------------------------------------------------------------------------------    Component Value Date/Time   BNP 443.6 (H) 11/27/2016 2032    Inpatient Medications  Scheduled Meds: . amitriptyline  50 mg Oral QHS  . apixaban  5 mg Oral BID  . cholecalciferol  1,000 Units Oral Daily  . darifenacin  7.5 mg Oral Daily  .  dextromethorphan-guaiFENesin  1 tablet Oral BID  . diltiazem  300 mg Oral Daily  . feeding supplement (ENSURE ENLIVE)  237 mL Oral BID BM  . flecainide  50 mg Oral Q12H  . hydrocortisone cream   Topical QHS  . levofloxacin  500 mg Oral Daily  . levothyroxine  112 mcg Oral QAC breakfast  . multivitamin with minerals  1 tablet Oral Daily  . pantoprazole  40 mg Oral Daily  . pravastatin  20 mg Oral QPM  . senna-docusate  2 tablet Oral QHS  . sodium chloride flush  3 mL Intravenous Q12H   Continuous Infusions: PRN Meds:.sodium chloride, acetaminophen, bisacodyl, butalbital-acetaminophen-caffeine, HYDROcodone-acetaminophen, levalbuterol, magnesium hydroxide, ondansetron (ZOFRAN) IV, polyethylene glycol, zolpidem  Micro Results No results found for this or any previous visit (from the past 240 hour(s)).  Radiology Reports Ct Chest Wo Contrast  Result Date: 12/06/2016 CLINICAL DATA:  Worsening shortness of breath for 3 weeks. EXAM: CT CHEST WITHOUT CONTRAST TECHNIQUE: Multidetector CT imaging of the chest was performed following the standard protocol without IV contrast. COMPARISON:  09/04/2015 FINDINGS: Cardiovascular: Heart is enlarged. Coronary artery calcification is noted. Ascending thoracic aorta 4.7 cm diameter. Mediastinum/Nodes: No mediastinal lymphadenopathy. No evidence for gross hilar lymphadenopathy although assessment is limited by the lack of intravenous contrast on today's study. The esophagus has normal imaging features. Large hiatal hernia. There is no axillary lymphadenopathy. Lungs/Pleura: Moderate changes emphysema. Scattered areas of bronchiectasis with interstitial scarring and volume loss. Posterior right lower lobe collapse/consolidation. Small moderate right pleural effusion. Lingular scarring noted. Upper Abdomen: Unremarkable. Musculoskeletal: Bone windows reveal no worrisome lytic or sclerotic osseous lesions. Multiple thoracic compression fractures, some of which have  been treated. IMPRESSION: 1. Moderate emphysema. 2. Scattered areas of bronchiectasis and interstitial scarring/volume loss. 3. Small moderate right pleural effusion. 4. 4.7 cm ascending thoracic aortic aneurysm. Recommend semi-annual imaging followup by CTA or MRA and referral to cardiothoracic surgery if not already obtained. This recommendation follows 2010 ACCF/AHA/AATS/ACR/ASA/SCA/SCAI/SIR/STS/SVM Guidelines for the Diagnosis and Management of Patients With Thoracic Aortic Disease. Circulation. 2010; 121ZK:5694362. Electronically Signed   By: Misty Stanley M.D.   On: 12/06/2016 19:45   Dg Chest Port 1 View  Result Date: 12/04/2016 CLINICAL DATA:  Pneumonia EXAM: PORTABLE CHEST 1 VIEW COMPARISON:  11/29/2016 FINDINGS: Patient is rotated to the left. There is cardiomegaly with vascular congestion heel bilateral airspace opacities are noted, right greater than left. This could represent asymmetric edema or infection. Airspace disease may have worsened in the right lung since prior study. Stable small right effusion. No acute bony abnormality. IMPRESSION: Asymmetric bilateral airspace disease, right greater than left, slightly worsened since prior study. Small right effusion. Electronically Signed   By: Rolm Baptise M.D.  On: 12/04/2016 15:06   Dg Chest Port 1 View  Result Date: 11/29/2016 CLINICAL DATA:  Cough, pneumonia EXAM: PORTABLE CHEST 1 VIEW COMPARISON:  11/27/2016 FINDINGS: Again noted asymmetric streaky airspace disease in right upper lobe and right lower lobe highly suspicious for pneumonia. Small left pleural effusion with left basilar atelectasis or infiltrate. Atherosclerotic calcifications of thoracic aorta. No convincing pulmonary edema. IMPRESSION: Again noted asymmetric streaky airspace disease in right upper lobe and right lower lobe highly suspicious for pneumonia. Small left pleural effusion with left basilar atelectasis or infiltrate. No pulmonary edema. Electronically Signed   By:  Lahoma Crocker M.D.   On: 11/29/2016 12:43   Dg Chest Port 1 View  Result Date: 11/27/2016 CLINICAL DATA:  Shortness of breath and cough for 3 weeks. EXAM: PORTABLE CHEST 1 VIEW COMPARISON:  Single-view of the chest 09/10/2015. FINDINGS: Airspace disease is seen in the right upper and lower lung zones and left lung base. There is cardiomegaly. Aortic atherosclerosis is noted. No pneumothorax. There appear to be small bilateral pleural effusions. IMPRESSION: Right worse than left airspace disease most worrisome for pneumonia. Small bilateral pleural effusions. Cardiomegaly. Atherosclerosis. Electronically Signed   By: Inge Rise M.D.   On: 11/27/2016 12:06    Time Spent in minutes  25   Louellen Molder M.D on 12/10/2016 at 10:00 AM  Between 7am to 7pm - Pager - [phone]  After 7pm go to [email or web address] - password [facility]  [facility] -  Office  [phone]

## 2016-12-10 NOTE — Care Management Note (Signed)
Case Management Note Previous CM note initiated by Lacretia Leigh, RN 11/30/2016, 9:22 AM   Patient Details  Name: [name] MRN: [id number] Date of Birth: 10-19-1936  Subjective/Objective:     Adm w at fib               Action/Plan:plans to return home w hhc   Expected Discharge Date:  12/10/16               Expected Discharge Plan:  [place]  In-House Referral:     Discharge planning Services  CM Consult, Medication Assistance  Post Acute Care Choice:  Home Health Choice offered to:  Patient  DME Arranged:    DME Agency:     HH Arranged:  PT, RN, OT, Nurse's Aide [place] Agency:  [place]  Status of Service:  Completed, signed off  If discussed at [place] of Stay Meetings, dates discussed:  1/23, 1/25  Discharge Disposition: home with home health   Additional Comments:   12/10/16- 1020- Lecia Esperanza RN, CM- pt for d/c home today- [facility] referral already in place with [facility] for HHRN/PT/OT/aide/CSW- notified Santiago Glad with [employer] of d/c home today.    12/07/16- 1415- Marvetta Gibbons RN, CM-  Plan continues to be home with [facility] when pt ready for discharge- referral has been made to [facility] for HH-RN/PT/OT/aide/SW- Santiago Glad with [facility] following for d/c.  Pt s/p TEE and continues tx for pna with IV abx  Lacretia Leigh, RN 11/30/2016, 9:22 AM--gave pt 30day free eliquis card. copay is 47.00 per month thereafter. Went over [employer] and [facility]. phy there rec hhpt at [place]. Ref to donna w [facility] for hhpt.  Dahlia Client [place], RN 12/10/2016, 10:23 AM [phone]

## 2016-12-10 NOTE — Progress Notes (Signed)
Discharged per MD order. 2 Left forearm IVs removed, telemetry d/c, CCMD notified. AVS went over with patient including medications, explained to schedule a follow up with primary care, and informed her that CM informed [facility] of her discharge and they will be contacting her to set up appt times. Had a thorough explanation of her home medication regiment. She verbalized understanding. No further questions at this time. Patient to home via friend by car.  Cyndia Bent RN

## 2016-12-10 NOTE — Progress Notes (Signed)
Physical Therapy Treatment Patient Details Name: [name] MRN: [id number] DOB: 26-Apr-1936 Today's Date: 12/10/2016    History of Present Illness Pt adm with afib with rvr and pna. PMH - osteoporosis, htn, breast ca, acdf, multiple vertebral fx's    PT Comments    Pt making good progress. She was able to walk farther before needing standing rest break. Con't to recommend HHPT.  Follow Up Recommendations  Home health PT     Equipment Recommendations  None recommended by PT (Pt has RW at home with tray)    Recommendations for Other Services       Precautions / Restrictions Precautions Precautions: Fall Restrictions Weight Bearing Restrictions: No    Mobility  Bed Mobility   Bed Mobility: Sit to Supine     Supine to sit: Modified independent (Device/Increase time) Sit to supine: Modified independent (Device/Increase time)   General bed mobility comments: use of rail  Transfers Overall transfer level: Modified independent Equipment used: Rolling walker (2 wheeled) Transfers: Sit to/from Stand Sit to Stand: Modified independent (Device/Increase time)         General transfer comment: stood from bed and toilet  Ambulation/Gait Ambulation/Gait assistance: Supervision Ambulation Distance (Feet): 150 Feet Assistive device: Rolling walker (2 wheeled) Gait Pattern/deviations: Step-through pattern;Trunk flexed (severe kyphosis and scoliosis) Gait velocity: decreased, but improved from last session   General Gait Details: Pt took 2 standing rest breaks, with 1st one not til midway through gait HR 96-99   Stairs            Wheelchair Mobility    Modified Rankin (Stroke Patients Only)       Balance                                    Cognition Arousal/Alertness: Awake/alert Behavior During Therapy: WFL for tasks assessed/performed Overall Cognitive Status: Within Functional Limits for tasks assessed                       Exercises      General Comments General comments (skin integrity, edema, etc.): Discussed energy conservation for the home and the shower      Pertinent Vitals/Pain Pain Assessment: No/denies pain    Home Living                      Prior Function            PT Goals (current goals can now be found in the care plan section) Acute Rehab PT Goals PT Goal Formulation: With patient Time For Goal Achievement: 12/13/16 Potential to Achieve Goals: Good Progress towards PT goals: Progressing toward goals    Frequency    Min 3X/week      PT Plan Current plan remains appropriate    Co-evaluation             End of Session           Time: [id number] PT Time Calculation (min) (ACUTE ONLY): 18 min  Charges:  $Gait Training: 8-22 mins                    G Codes:      [name],Annette Dunstan LUBECK 12/10/2016, 10:50 AM

## 2016-12-10 NOTE — Care Management Important Message (Signed)
Important Message  Patient Details  Name: [name] MRN: [id number] Date of Birth: 02-25-1936   Medicare Important Message Given:  Yes    Patsy Varma Abena 12/10/2016, 1:09 PM

## 2016-12-11 DIAGNOSIS — K219 Gastro-esophageal reflux disease without esophagitis: Secondary | ICD-10-CM | POA: Diagnosis not present

## 2016-12-11 DIAGNOSIS — Z8701 Personal history of pneumonia (recurrent): Secondary | ICD-10-CM | POA: Diagnosis not present

## 2016-12-11 DIAGNOSIS — I340 Nonrheumatic mitral (valve) insufficiency: Secondary | ICD-10-CM | POA: Diagnosis not present

## 2016-12-11 DIAGNOSIS — Z853 Personal history of malignant neoplasm of breast: Secondary | ICD-10-CM | POA: Diagnosis not present

## 2016-12-11 DIAGNOSIS — E785 Hyperlipidemia, unspecified: Secondary | ICD-10-CM | POA: Diagnosis not present

## 2016-12-11 DIAGNOSIS — M810 Age-related osteoporosis without current pathological fracture: Secondary | ICD-10-CM | POA: Diagnosis not present

## 2016-12-11 DIAGNOSIS — I110 Hypertensive heart disease with heart failure: Secondary | ICD-10-CM | POA: Diagnosis not present

## 2016-12-11 DIAGNOSIS — Z86718 Personal history of other venous thrombosis and embolism: Secondary | ICD-10-CM | POA: Diagnosis not present

## 2016-12-11 DIAGNOSIS — I4891 Unspecified atrial fibrillation: Secondary | ICD-10-CM | POA: Diagnosis not present

## 2016-12-11 DIAGNOSIS — M797 Fibromyalgia: Secondary | ICD-10-CM | POA: Diagnosis not present

## 2016-12-11 DIAGNOSIS — F419 Anxiety disorder, unspecified: Secondary | ICD-10-CM | POA: Diagnosis not present

## 2016-12-11 DIAGNOSIS — E039 Hypothyroidism, unspecified: Secondary | ICD-10-CM | POA: Diagnosis not present

## 2016-12-11 DIAGNOSIS — I5033 Acute on chronic diastolic (congestive) heart failure: Secondary | ICD-10-CM | POA: Diagnosis not present

## 2016-12-12 DIAGNOSIS — M797 Fibromyalgia: Secondary | ICD-10-CM | POA: Diagnosis not present

## 2016-12-12 DIAGNOSIS — I110 Hypertensive heart disease with heart failure: Secondary | ICD-10-CM | POA: Diagnosis not present

## 2016-12-12 DIAGNOSIS — M810 Age-related osteoporosis without current pathological fracture: Secondary | ICD-10-CM | POA: Diagnosis not present

## 2016-12-12 DIAGNOSIS — I5033 Acute on chronic diastolic (congestive) heart failure: Secondary | ICD-10-CM | POA: Diagnosis not present

## 2016-12-12 DIAGNOSIS — I4891 Unspecified atrial fibrillation: Secondary | ICD-10-CM | POA: Diagnosis not present

## 2016-12-12 DIAGNOSIS — I340 Nonrheumatic mitral (valve) insufficiency: Secondary | ICD-10-CM | POA: Diagnosis not present

## 2016-12-14 DIAGNOSIS — I4891 Unspecified atrial fibrillation: Secondary | ICD-10-CM | POA: Diagnosis not present

## 2016-12-14 DIAGNOSIS — I5033 Acute on chronic diastolic (congestive) heart failure: Secondary | ICD-10-CM | POA: Diagnosis not present

## 2016-12-14 DIAGNOSIS — M797 Fibromyalgia: Secondary | ICD-10-CM | POA: Diagnosis not present

## 2016-12-14 DIAGNOSIS — I340 Nonrheumatic mitral (valve) insufficiency: Secondary | ICD-10-CM | POA: Diagnosis not present

## 2016-12-14 DIAGNOSIS — I110 Hypertensive heart disease with heart failure: Secondary | ICD-10-CM | POA: Diagnosis not present

## 2016-12-14 DIAGNOSIS — M810 Age-related osteoporosis without current pathological fracture: Secondary | ICD-10-CM | POA: Diagnosis not present

## 2016-12-17 DIAGNOSIS — I340 Nonrheumatic mitral (valve) insufficiency: Secondary | ICD-10-CM | POA: Diagnosis not present

## 2016-12-17 DIAGNOSIS — I5033 Acute on chronic diastolic (congestive) heart failure: Secondary | ICD-10-CM | POA: Diagnosis not present

## 2016-12-17 DIAGNOSIS — M797 Fibromyalgia: Secondary | ICD-10-CM | POA: Diagnosis not present

## 2016-12-17 DIAGNOSIS — M810 Age-related osteoporosis without current pathological fracture: Secondary | ICD-10-CM | POA: Diagnosis not present

## 2016-12-17 DIAGNOSIS — I110 Hypertensive heart disease with heart failure: Secondary | ICD-10-CM | POA: Diagnosis not present

## 2016-12-17 DIAGNOSIS — I4891 Unspecified atrial fibrillation: Secondary | ICD-10-CM | POA: Diagnosis not present

## 2016-12-18 DIAGNOSIS — M810 Age-related osteoporosis without current pathological fracture: Secondary | ICD-10-CM | POA: Diagnosis not present

## 2016-12-18 DIAGNOSIS — I340 Nonrheumatic mitral (valve) insufficiency: Secondary | ICD-10-CM | POA: Diagnosis not present

## 2016-12-18 DIAGNOSIS — I4891 Unspecified atrial fibrillation: Secondary | ICD-10-CM | POA: Diagnosis not present

## 2016-12-18 DIAGNOSIS — I5033 Acute on chronic diastolic (congestive) heart failure: Secondary | ICD-10-CM | POA: Diagnosis not present

## 2016-12-18 DIAGNOSIS — M797 Fibromyalgia: Secondary | ICD-10-CM | POA: Diagnosis not present

## 2016-12-18 DIAGNOSIS — I110 Hypertensive heart disease with heart failure: Secondary | ICD-10-CM | POA: Diagnosis not present

## 2016-12-19 DIAGNOSIS — I5033 Acute on chronic diastolic (congestive) heart failure: Secondary | ICD-10-CM | POA: Diagnosis not present

## 2016-12-19 DIAGNOSIS — I110 Hypertensive heart disease with heart failure: Secondary | ICD-10-CM | POA: Diagnosis not present

## 2016-12-19 DIAGNOSIS — I340 Nonrheumatic mitral (valve) insufficiency: Secondary | ICD-10-CM | POA: Diagnosis not present

## 2016-12-19 DIAGNOSIS — M797 Fibromyalgia: Secondary | ICD-10-CM | POA: Diagnosis not present

## 2016-12-19 DIAGNOSIS — I4891 Unspecified atrial fibrillation: Secondary | ICD-10-CM | POA: Diagnosis not present

## 2016-12-19 DIAGNOSIS — M810 Age-related osteoporosis without current pathological fracture: Secondary | ICD-10-CM | POA: Diagnosis not present

## 2016-12-20 ENCOUNTER — Encounter: Payer: Self-pay | Admitting: Physician Assistant

## 2016-12-20 DIAGNOSIS — J189 Pneumonia, unspecified organism: Secondary | ICD-10-CM | POA: Diagnosis not present

## 2016-12-20 DIAGNOSIS — I480 Paroxysmal atrial fibrillation: Secondary | ICD-10-CM | POA: Diagnosis not present

## 2016-12-20 DIAGNOSIS — Z5181 Encounter for therapeutic drug level monitoring: Secondary | ICD-10-CM | POA: Diagnosis not present

## 2016-12-20 DIAGNOSIS — I340 Nonrheumatic mitral (valve) insufficiency: Secondary | ICD-10-CM | POA: Diagnosis not present

## 2016-12-21 ENCOUNTER — Encounter: Payer: Self-pay | Admitting: Physician Assistant

## 2016-12-21 DIAGNOSIS — I340 Nonrheumatic mitral (valve) insufficiency: Secondary | ICD-10-CM | POA: Diagnosis not present

## 2016-12-21 DIAGNOSIS — I4891 Unspecified atrial fibrillation: Secondary | ICD-10-CM | POA: Diagnosis not present

## 2016-12-21 DIAGNOSIS — M810 Age-related osteoporosis without current pathological fracture: Secondary | ICD-10-CM | POA: Diagnosis not present

## 2016-12-21 DIAGNOSIS — I5032 Chronic diastolic (congestive) heart failure: Secondary | ICD-10-CM | POA: Insufficient documentation

## 2016-12-21 DIAGNOSIS — I480 Paroxysmal atrial fibrillation: Secondary | ICD-10-CM | POA: Insufficient documentation

## 2016-12-21 DIAGNOSIS — M797 Fibromyalgia: Secondary | ICD-10-CM | POA: Diagnosis not present

## 2016-12-21 DIAGNOSIS — I5033 Acute on chronic diastolic (congestive) heart failure: Secondary | ICD-10-CM | POA: Diagnosis not present

## 2016-12-21 DIAGNOSIS — I110 Hypertensive heart disease with heart failure: Secondary | ICD-10-CM | POA: Diagnosis not present

## 2016-12-21 NOTE — Progress Notes (Addendum)
Cardiology Office Note    Date:  12/26/2016  ID:  [name], DOB [birth year], MRN [id number] PCP:  [name], MD  Cardiologist:  New to Dr. Newt Minion. [facility]   Chief Complaint: f/u atrial fib  History of Present Illness:  [name] is a 81 y.o. female with history of osteoporosis, hyperlipidemia, hypertension, hypothyroidism, breast Ca (lumpx/ XRT 2012-3), DVT in 2012, GERD, 4.3cm ascending thoracic aneurysm (followed by Dr. Prescott Gum), anxiety, and recently diagnosed atrial fib/diastolic CHF/mitral regurg who presents for post-hospital follow-up. She was admitted in 11/2016 with SOB and found to have rapid atrial fib in the setting of acute respiratory failure with hypoxia and PNA. She was also treated for presumed diastolic CHF with IV Lasix. 2D echo 11/28/16: mild LVH, EF 55-60%, mild-mod AI, mod MR, severe LAE, mod RAE, severe TR, PASP 67. 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 She spontaneously converted to NSR on diltiazem and flecainide. At present time it is felt she does not have indication for surgery at this time but ultimately may require mitral valve repair with MAZE in the future. Last BMET/CBC 12/10/16 wnl, Cr 0.73, Hgb 13.0. She was continued on prior Eliquis. Remote nuc 2012 wnl.  She presents back for follow-up overall doing well. She does feel tired at times. She is trying to build up her endurance.  She denies any CP, SOB, palpitations, syncope or bleeding. + intermittent mild LEE. She does admit she likes sea salt. States she saw her PCP who did bloodwork but she hasn't heard back yet. She has many questions about her recent diagnoses and I tried my best to answer all of them. Her husband died in 2023-10-05.  Past Medical History:  Diagnosis Date  . Anxiety   . Aortic regurgitation   .  Breast cancer ([place]) 08/20/2011   R breast DCIS, ER/PR +  . Cataract 3 and 10-05-1991   bilateral  . Chronic diastolic CHF (congestive heart failure) ([facility])   . DVT (deep venous thrombosis) ([place]) 10/05/11   LL extremity   . Fibromyalgia   . Fracture lumbar vertebra-closed ([place])   . Fracture of thoracic vertebra, closed ([place])   . GERD (gastroesophageal reflux disease)   . H/O hiatal hernia   . History of blood clots   . History of radiation therapy 01/2012   R breast  . Hypercholesteremia   . Hypertension    DR Orinda Kenner  . Hypothyroidism   . Mitral regurgitation   . Osteoporosis   . PAF (paroxysmal atrial fibrillation) ([facility])    a. dx 11/2016.  . Rib fractures   . Thoracic ascending aortic aneurysm ([place])    a. followed by Dr. Prescott Gum.    Past Surgical History:  Procedure Laterality Date  . ANTERIOR CERVICAL DECOMP/DISCECTOMY FUSION N/A 09/09/2015   Procedure: Anterior Cervical Decompression and Fusion Cervical seven-Thorasic one ;  Surgeon: Erline Levine, MD;  Location: [facility];  Service: Neurosurgery;  Laterality: N/A;  . APPENDECTOMY  1940  . BREAST SURGERY    . CATARACT EXTRACTION  1992  . EYE SURGERY  1940, 1956  . HERNIA REPAIR  10/16/2006   [facility] - Dr Hassell Done  . KYPHOPLASTY N/A 01/26/2013   Procedure: KYPHOPLASTY;  Surgeon: Kristeen Miss, MD;  Location: [facility];  Service: Neurosurgery;  Laterality: N/A;  T11 and  L1  . MASTECTOMY, PARTIAL  10/17/2011   Procedure: MASTECTOMY PARTIAL;  Surgeon: Haywood Lasso, MD;  Location: [facility];  Service: General;  Laterality: Right;  needle guided  . TEE WITHOUT CARDIOVERSION N/A 12/04/2016   Procedure: TRANSESOPHAGEAL ECHOCARDIOGRAM (TEE);  Surgeon: Pixie Casino, MD;  [facility];  Service: Cardiovascular;  Laterality: N/A;  . TONSILLECTOMY  1944    Current Medications: Current Outpatient Prescriptions  Medication Sig Dispense Refill  . apixaban (ELIQUIS) 5 MG TABS tablet Take 1 tablet (5 mg total) by mouth 2 (two)  times daily. 60 tablet 0  . BONIVA 150 MG tablet Take 150 mg by mouth every 30 (thirty) days.     . calcium citrate-vitamin D (CITRACAL+D) 315-200 MG-UNIT per tablet Take 1 tablet by mouth 2 (two) times daily. 60 tablet 11  . cholecalciferol (VITAMIN D) 1000 UNITS tablet Take 1,000 Units by mouth daily. Vitamin D3    . diltiazem (CARDIZEM CD) 300 MG 24 hr capsule Take 1 capsule (300 mg total) by mouth daily. 30 capsule 0  . flecainide (TAMBOCOR) 50 MG tablet Take 1 tablet (50 mg total) by mouth every 12 (twelve) hours. 60 tablet 0  . HYDROcodone-acetaminophen (NORCO/VICODIN) 5-325 MG tablet Take 1-2 tablets by mouth every 4 (four) hours as needed (mild pain). 30 tablet 0  . levothyroxine (SYNTHROID, LEVOTHROID) 112 MCG tablet Take 112 mcg by mouth daily.    . Multiple Vitamin (MULTIVITAMIN) tablet Take 1 tablet by mouth daily.     . pravastatin (PRAVACHOL) 20 MG tablet Take 20 mg by mouth every evening.     . solifenacin (VESICARE) 5 MG tablet Take 5 mg by mouth daily.     No current facility-administered medications for this visit.      Allergies:   Contrast media [iodinated diagnostic agents]; Iohexol; and Penicillins   Social History   Social History  . Marital status: Married    Spouse name: N/A  . Number of children: N/A  . Years of education: N/A   Social History Main Topics  . Smoking status: Never Smoker  . Smokeless tobacco: Never Used  . Alcohol use No  . Drug use: No  . Sexual activity: No   Other Topics Concern  . None   Social History Narrative  . None     Family History:  The patient's family history includes Heart disease in her maternal grandfather, maternal uncle, and mother.   ROS:   Please see the history of present illness.  All other systems are reviewed and otherwise negative.    PHYSICAL EXAM:   VS:  BP 120/84 (BP Location: Left Arm, Patient Position: Sitting, Cuff Size: Normal)   Pulse 67   Ht 4\' 11"  (1.499 m)   Wt 106 lb 1.9 oz (48.1 kg)    SpO2 97%   BMI 21.43 kg/m   BMI: Body mass index is 21.43 kg/m. GEN: Thin [name] in no acute distress  HEENT: normocephalic, atraumatic, + strabismus Neck: no JVD, carotid bruits, or masses Cardiac: RRR; soft SEM at apex, no rubs or gallops, minimal pedal edema bilaterally Respiratory:  clear to auscultation bilaterally, normal work of breathing GI: soft, nontender, nondistended, + BS MS: significant kyphosis Skin: warm and dry, no rash Neuro:  Alert and Oriented x 3, Strength and sensation are intact, follows commands Psych: euthymic mood, full affect  Wt Readings from Last 3 Encounters:  12/26/16 106 lb 1.9 oz (48.1 kg)  12/10/16 104 lb 6.4 oz (47.4 kg)  10/12/15 105 lb (47.6 kg)      Studies/Labs Reviewed:   EKG:  EKG was ordered today and personally reviewed by me and demonstrates NSR 67bpm, possible LAE, LAFB, prior anterior infarct possibly noted, QTc 425ms  Recent Labs: 11/27/2016: B Natriuretic Peptide 443.6; TSH 2.994 11/29/2016: Magnesium 1.9 12/10/2016: BUN 17; Creatinine, Ser 0.73; Hemoglobin 13.0; Platelets 289; Potassium 4.1; Sodium 136   Additional studies/ records that were reviewed today include: Summarized above.    ASSESSMENT & PLAN:   1. Paroxysmal atrial fib - maintaining NSR on diltiazem and flecainide. 2D echo showed normal LVEF. She has not had ischemic eval in quite some time. She walks with a walker so cannot do screening ETT. Will arrange Lexiscan nuc given flecainide use. Continue apixaban. She will need reduction of her dose when she turns 82 or if her creatinine rises. She is working with PT at home and doing quite well per her report. 2. Chronic diastolic CHF - the patient was surprised of this diagnosis which was outlined in recent discharge. She does recall being treated with IV Lasix with improvement. I suspect this was driven primarily by her AF RVR. We reviewed importance of low sodium diet (2g max) and fluid restriction to 64oz per day.  Also discussed elevation of lower extremities when seated and compression hose to assist with mild edema. 3. Mitral valve regurgitation - follow clinically.  Ultimately, she may require mitral valve repair with MAZE in the future. She is well known to Dr Prescott Gum. 4. Essential HTN - controlled. 5. Thoracic aortic aneurysm - will send a message to TCTS coordinator Levonne Spiller to help get her back plugged into follow-up with Dr. Prescott Gum.   Disposition: F/u with Dr. Meda Coffee in 3 months.   Medication Adjustments/Labs and Tests Ordered: Current medicines are reviewed at length with the patient today.  Concerns regarding medicines are outlined above. Medication changes, Labs and Tests ordered today are summarized above and listed in the Patient Instructions accessible in Encounters.   Raechel Ache PA-C  12/26/2016 2:13 PM    [facility] [address], [place], [place]  [address] Phone: [phone]; Fax: [phone]

## 2016-12-24 DIAGNOSIS — N8111 Cystocele, midline: Secondary | ICD-10-CM | POA: Diagnosis not present

## 2016-12-24 DIAGNOSIS — N814 Uterovaginal prolapse, unspecified: Secondary | ICD-10-CM | POA: Diagnosis not present

## 2016-12-25 DIAGNOSIS — I340 Nonrheumatic mitral (valve) insufficiency: Secondary | ICD-10-CM | POA: Diagnosis not present

## 2016-12-25 DIAGNOSIS — I110 Hypertensive heart disease with heart failure: Secondary | ICD-10-CM | POA: Diagnosis not present

## 2016-12-25 DIAGNOSIS — M797 Fibromyalgia: Secondary | ICD-10-CM | POA: Diagnosis not present

## 2016-12-25 DIAGNOSIS — M810 Age-related osteoporosis without current pathological fracture: Secondary | ICD-10-CM | POA: Diagnosis not present

## 2016-12-25 DIAGNOSIS — I5033 Acute on chronic diastolic (congestive) heart failure: Secondary | ICD-10-CM | POA: Diagnosis not present

## 2016-12-25 DIAGNOSIS — I4891 Unspecified atrial fibrillation: Secondary | ICD-10-CM | POA: Diagnosis not present

## 2016-12-26 ENCOUNTER — Encounter (INDEPENDENT_AMBULATORY_CARE_PROVIDER_SITE_OTHER): Payer: Self-pay

## 2016-12-26 ENCOUNTER — Encounter: Payer: Self-pay | Admitting: Physician Assistant

## 2016-12-26 ENCOUNTER — Other Ambulatory Visit: Payer: Self-pay | Admitting: Internal Medicine

## 2016-12-26 ENCOUNTER — Ambulatory Visit (INDEPENDENT_AMBULATORY_CARE_PROVIDER_SITE_OTHER): Payer: Medicare Other | Admitting: Physician Assistant

## 2016-12-26 ENCOUNTER — Ambulatory Visit
Admission: RE | Admit: 2016-12-26 | Discharge: 2016-12-26 | Disposition: A | Discharge status: Home / Self Care | Payer: Medicare Other | Source: Ambulatory Visit | Attending: Internal Medicine | Admitting: Internal Medicine

## 2016-12-26 VITALS — BP 120/84 | HR 67 | Ht 59.0 in | Wt 106.1 lb

## 2016-12-26 DIAGNOSIS — I480 Paroxysmal atrial fibrillation: Secondary | ICD-10-CM

## 2016-12-26 DIAGNOSIS — J189 Pneumonia, unspecified organism: Secondary | ICD-10-CM | POA: Diagnosis not present

## 2016-12-26 DIAGNOSIS — I340 Nonrheumatic mitral (valve) insufficiency: Secondary | ICD-10-CM | POA: Diagnosis not present

## 2016-12-26 DIAGNOSIS — I10 Essential (primary) hypertension: Secondary | ICD-10-CM

## 2016-12-26 DIAGNOSIS — I712 Thoracic aortic aneurysm, without rupture, unspecified: Secondary | ICD-10-CM

## 2016-12-26 DIAGNOSIS — I5032 Chronic diastolic (congestive) heart failure: Secondary | ICD-10-CM | POA: Diagnosis not present

## 2016-12-26 MED ORDER — DILTIAZEM HCL ER COATED BEADS 300 MG PO CP24: 300.0000 mg | ORAL_CAPSULE | Freq: Every day | ORAL | 0 refills | Status: DC

## 2016-12-26 MED ORDER — APIXABAN 5 MG PO TABS: 5.0000 mg | ORAL_TABLET | Freq: Two times a day (BID) | ORAL | 0 refills | Status: DC

## 2016-12-26 MED ORDER — FLECAINIDE ACETATE 50 MG PO TABS: 50.0000 mg | ORAL_TABLET | Freq: Two times a day (BID) | ORAL | 0 refills | Status: DC

## 2016-12-26 NOTE — Patient Instructions (Addendum)
Medication Instructions:  Your physician recommends that you continue on your current medications as directed. Please refer to the Current Medication list given to you today.   Labwork: None ordered  Testing/Procedures: Your physician has requested that you have a lexiscan myoview. For further information please visit [email or web address]. Please follow instruction sheet, as given.    Follow-Up: Your physician recommends that you schedule a follow-up appointment in: 3 MONTHS WITH DR. Meda Coffee    Any Other Special Instructions Will Be Listed Below (If Applicable). Pharmacologic Stress Electrocardiogram A pharmacologic stress electrocardiogram is a heart (cardiac) test that uses nuclear imaging to evaluate the blood supply to your heart. This test may also be called a pharmacologic stress electrocardiography. Pharmacologic means that a medicine is used to increase your heart rate and blood pressure.  This stress test is done to find areas of poor blood flow to the heart by determining the extent of coronary artery disease (CAD). Some people exercise on a treadmill, which naturally increases the blood flow to the heart. For those people unable to exercise on a treadmill, a medicine is used. This medicine stimulates your heart and will cause your heart to beat harder and more quickly, as if you were exercising.  Pharmacologic stress tests can help determine:  The adequacy of blood flow to your heart during increased levels of activity in order to clear you for discharge home.  The extent of coronary artery blockage caused by CAD.  Your prognosis if you have suffered a heart attack.  The effectiveness of cardiac procedures done, such as an angioplasty, which can increase the circulation in your coronary arteries.  Causes of chest pain or pressure. LET [facility] CARE PROVIDER KNOW ABOUT:  Any allergies you have.  All medicines you are taking, including vitamins, herbs, eye drops, creams,  and over-the-counter medicines.  Previous problems you or members of your family have had with the use of anesthetics.  Any blood disorders you have.  Previous surgeries you have had.  Medical conditions you have.  Possibility of pregnancy, if this applies.  If you are currently breastfeeding. RISKS AND COMPLICATIONS Generally, this is a safe procedure. However, as with any procedure, complications can occur. Possible complications include:  You develop pain or pressure in the following areas:  Chest.  Jaw or neck.  Between your shoulder blades.  Radiating down your left arm.  Headache.  Dizziness or light-headedness.  Shortness of breath.  Increased or irregular heartbeat.  Low blood pressure.  Nausea or vomiting.  Flushing.  Redness going up the arm and slight pain during injection of medicine.  Heart attack (rare). BEFORE THE PROCEDURE   Avoid all forms of caffeine for 24 hours before your test or as directed by your health care provider. This includes coffee, tea (even decaffeinated tea), caffeinated sodas, chocolate, cocoa, and certain pain medicines.  Follow your health care provider's instructions regarding eating and drinking before the test.  Take your medicines as directed at regular times with water unless instructed otherwise. Exceptions may include:  If you have diabetes, ask how you are to take your insulin or pills. It is common to adjust insulin dosing the morning of the test.  If you are taking beta-blocker medicines, it is important to talk to your health care provider about these medicines well before the date of your test. Taking beta-blocker medicines may interfere with the test. In some cases, these medicines need to be changed or stopped 24 hours or more before the test.  If you wear a nitroglycerin patch, it may need to be removed prior to the test. Ask your health care provider if the patch should be removed before the test.  If you  use an inhaler for any breathing condition, bring it with you to the test.  If you are an outpatient, bring a snack so you can eat right after the stress phase of the test.  Do not smoke for 4 hours prior to the test or as directed by your health care provider.  Do not apply lotions, powders, creams, or oils on your chest prior to the test.  Wear comfortable shoes and clothing. Let your health care provider know if you were unable to complete or follow the preparations for your test. PROCEDURE   Multiple patches (electrodes) will be put on your chest. If needed, small areas of your chest may be shaved to get better contact with the electrodes. Once the electrodes are attached to your body, multiple wires will be attached to the electrodes, and your heart rate will be monitored.  An IV access will be started. A nuclear trace (isotope) is given. The isotope may be given intravenously, or it may be swallowed. Nuclear refers to several types of radioactive isotopes, and the nuclear isotope lights up the arteries so that the nuclear images are clear. The isotope is absorbed by your body. This results in low radiation exposure.  A resting nuclear image is taken to show how your heart functions at rest.  A medicine is given through the IV access.  A second scan is done about 1 hour after the medicine injection and determines how your heart functions under stress.  During this stress phase, you will be connected to an electrocardiogram machine. Your blood pressure and oxygen levels will be monitored. AFTER THE PROCEDURE   Your heart rate and blood pressure will be monitored after the test.  You may return to your normal schedule, including diet,activities, and medicines, unless your health care provider tells you otherwise. This information is not intended to replace advice given to you by your health care provider. Make sure you discuss any questions you have with your health care  provider. Document Released: 03/17/2009 Document Revised: 11/03/2013 Document Reviewed: 07/06/2013 Elsevier Interactive Patient Education  2017 [employer].     If you need a refill on your cardiac medications before your next appointment, please call your pharmacy.

## 2016-12-27 DIAGNOSIS — M810 Age-related osteoporosis without current pathological fracture: Secondary | ICD-10-CM | POA: Diagnosis not present

## 2016-12-27 DIAGNOSIS — I340 Nonrheumatic mitral (valve) insufficiency: Secondary | ICD-10-CM | POA: Diagnosis not present

## 2016-12-27 DIAGNOSIS — I110 Hypertensive heart disease with heart failure: Secondary | ICD-10-CM | POA: Diagnosis not present

## 2016-12-27 DIAGNOSIS — I4891 Unspecified atrial fibrillation: Secondary | ICD-10-CM | POA: Diagnosis not present

## 2016-12-27 DIAGNOSIS — M797 Fibromyalgia: Secondary | ICD-10-CM | POA: Diagnosis not present

## 2016-12-27 DIAGNOSIS — I5033 Acute on chronic diastolic (congestive) heart failure: Secondary | ICD-10-CM | POA: Diagnosis not present

## 2016-12-28 DIAGNOSIS — I340 Nonrheumatic mitral (valve) insufficiency: Secondary | ICD-10-CM | POA: Diagnosis not present

## 2016-12-28 DIAGNOSIS — M797 Fibromyalgia: Secondary | ICD-10-CM | POA: Diagnosis not present

## 2016-12-28 DIAGNOSIS — M810 Age-related osteoporosis without current pathological fracture: Secondary | ICD-10-CM | POA: Diagnosis not present

## 2016-12-28 DIAGNOSIS — I5033 Acute on chronic diastolic (congestive) heart failure: Secondary | ICD-10-CM | POA: Diagnosis not present

## 2016-12-28 DIAGNOSIS — I4891 Unspecified atrial fibrillation: Secondary | ICD-10-CM | POA: Diagnosis not present

## 2016-12-28 DIAGNOSIS — I110 Hypertensive heart disease with heart failure: Secondary | ICD-10-CM | POA: Diagnosis not present

## 2016-12-31 DIAGNOSIS — I4891 Unspecified atrial fibrillation: Secondary | ICD-10-CM | POA: Diagnosis not present

## 2016-12-31 DIAGNOSIS — M797 Fibromyalgia: Secondary | ICD-10-CM | POA: Diagnosis not present

## 2016-12-31 DIAGNOSIS — I5033 Acute on chronic diastolic (congestive) heart failure: Secondary | ICD-10-CM | POA: Diagnosis not present

## 2016-12-31 DIAGNOSIS — M810 Age-related osteoporosis without current pathological fracture: Secondary | ICD-10-CM | POA: Diagnosis not present

## 2016-12-31 DIAGNOSIS — I110 Hypertensive heart disease with heart failure: Secondary | ICD-10-CM | POA: Diagnosis not present

## 2016-12-31 DIAGNOSIS — I340 Nonrheumatic mitral (valve) insufficiency: Secondary | ICD-10-CM | POA: Diagnosis not present

## 2017-01-01 ENCOUNTER — Encounter: Payer: Self-pay | Admitting: Cardiothoracic Surgery

## 2017-01-01 ENCOUNTER — Ambulatory Visit (INDEPENDENT_AMBULATORY_CARE_PROVIDER_SITE_OTHER): Payer: Medicare Other | Admitting: Cardiothoracic Surgery

## 2017-01-01 VITALS — BP 126/77 | Resp 16 | Ht 59.0 in | Wt 106.0 lb

## 2017-01-01 DIAGNOSIS — I712 Thoracic aortic aneurysm, without rupture: Secondary | ICD-10-CM | POA: Diagnosis not present

## 2017-01-01 DIAGNOSIS — I7121 Aneurysm of the ascending aorta, without rupture: Secondary | ICD-10-CM

## 2017-01-01 NOTE — Progress Notes (Signed)
PCP is [name], MD Referring Provider is [name], MD  Chief Complaint  Patient presents with  . TAA    f/u with CT CHEST    HPI: Patient presents for her annual review of her moderate ascending fusiform aneurysm Percent aorta has been stable at 4.6 cm for several years It is asymptomatic. She has associated mild aortic insufficiency This year we did not do an annual CT scan since her aorta is been stable for several years. A chest x-ray was performed which shows no change from her chest x-ray 2016. She has moderate calcification of the distal aortic arch. She also has significant scoliosis and kyphosis.  The patient was hospitalized approximately 10 days in January for right upper lobe pneumonia. This responded to antibiotics. She developed atrial fibrillation and converted to sinus rhythm on flecainide. She was placed on Eliquis. Cardiac enzymes are negative. Echocardiogram showed EF 55-60 percent but with right-sided hypertension and moderate TR. She was in A. fib at the time of the echo. She had ankle edema which responded to Lasix. Since returning home her ankle edema has recurred to some extent probably related to diastolic heart failure and poor diet related to her acute illness. Her creatinine and BUN were normal at discharge. We will give her a short course of oral Lasix to help with her lower extremity edema.  The patient denies any chest pain or back pain suggestive of thoracic aortic disease, hematoma or penetrating ulcer.  Past Medical History:  Diagnosis Date  . Anxiety   . Aortic regurgitation   . Breast cancer ([place]) 08/20/2011   R breast DCIS, ER/PR +  . Cataract 3 and 10/92   bilateral  . Chronic diastolic CHF (congestive heart failure) ([place])   . DVT (deep venous thrombosis) ([place]) 08/2011   LL extremity   . Fibromyalgia   . Fracture lumbar vertebra-closed ([place])   . Fracture of thoracic vertebra, closed ([place])   . GERD (gastroesophageal reflux  disease)   . H/O hiatal hernia   . History of blood clots   . History of radiation therapy 01/2012   R breast  . Hypercholesteremia   . Hypertension    DR Orinda Kenner  . Hypothyroidism   . Mitral regurgitation   . Osteoporosis   . PAF (paroxysmal atrial fibrillation) ([place])    a. dx 11/2016.  . Rib fractures   . Thoracic ascending aortic aneurysm ([place])    a. followed by Dr. Prescott Gum.    Past Surgical History:  Procedure Laterality Date  . ANTERIOR CERVICAL DECOMP/DISCECTOMY FUSION N/A 09/09/2015   Procedure: Anterior Cervical Decompression and Fusion Cervical seven-Thorasic one ;  Surgeon: Erline Levine, MD;  Location: [facility] NEURO ORS;  Service: Neurosurgery;  Laterality: N/A;  . APPENDECTOMY  1940  . BREAST SURGERY    . CATARACT EXTRACTION  1992  . EYE SURGERY  1940, 1956  . HERNIA REPAIR  10/16/2006   [facility] - Dr Hassell Done  . KYPHOPLASTY N/A 01/26/2013   Procedure: KYPHOPLASTY;  Surgeon: Kristeen Miss, MD;  Location: [facility]S;  Service: Neurosurgery;  Laterality: N/A;  T11 and L1  . MASTECTOMY, PARTIAL  10/17/2011   Procedure: MASTECTOMY PARTIAL;  Surgeon: Haywood Lasso, MD;  Location: [facility];  Service: General;  Laterality: Right;  needle guided  . TEE WITHOUT CARDIOVERSION N/A 12/04/2016   Procedure: TRANSESOPHAGEAL ECHOCARDIOGRAM (TEE);  Surgeon: Pixie Casino, MD;  Location[facility] END[facility]COPY;  Service: Cardiovascular;  Laterality: N/A;  . TONSILLECTOMY  1944  Family History  Problem Relation Age of Onset  . Heart disease Mother   . Heart disease Maternal Uncle   . Heart disease Maternal Grandfather     Social History Social History  Substance Use Topics  . Smoking status: Never Smoker  . Smokeless tobacco: Never Used  . Alcohol use No    Current Outpatient Prescriptions  Medication Sig Dispense Refill  . apixaban (ELIQUIS) 5 MG TABS tablet Take 1 tablet (5 mg total) by mouth 2 (two) times daily. 180 tablet 0  . BONIVA 150 MG tablet Take 150 mg by mouth every 30  (thirty) days.     . calcium citrate-vitamin D (CITRACAL+D) 315-200 MG-UNIT per tablet Take 1 tablet by mouth 2 (two) times daily. 60 tablet 11  . cholecalciferol (VITAMIN D) 1000 UNITS tablet Take 1,000 Units by mouth daily. Vitamin D3    . diltiazem (CARDIZEM CD) 300 MG 24 hr capsule Take 1 capsule (300 mg total) by mouth daily. 90 capsule 0  . flecainide (TAMBOCOR) 50 MG tablet Take 1 tablet (50 mg total) by mouth every 12 (twelve) hours. 180 tablet 0  . HYDROcodone-acetaminophen (NORCO/VICODIN) 5-325 MG tablet Take 1-2 tablets by mouth every 4 (four) hours as needed (mild pain). 30 tablet 0  . levothyroxine (SYNTHROID, LEVOTHROID) 112 MCG tablet Take 112 mcg by mouth daily.    . Multiple Vitamin (MULTIVITAMIN) tablet Take 1 tablet by mouth daily.     Marland Kitchen omeprazole (PRILOSEC OTC) 20 MG tablet Take 20 mg by mouth daily.    . pravastatin (PRAVACHOL) 20 MG tablet Take 20 mg by mouth every evening.     . solifenacin (VESICARE) 5 MG tablet Take 5 mg by mouth daily.     No current facility-administered medications for this visit.     Allergies  Allergen Reactions  . Contrast Media [Iodinated Diagnostic Agents] Shortness Of Breath  . Iohexol Shortness Of Breath  . Penicillins Rash    Has patient had a PCN reaction causing immediate rash, facial/tongue/throat swelling, SOB or lightheadedness with hypotension: No Has patient had a PCN reaction causing severe rash involving mucus membranes or skin necrosis: No Has patient had a PCN reaction that required hospitalization No Has patient had a PCN reaction occurring within the last 10 years: No If all of the above answers are "NO", then may proceed with Cephalosporin use.     Review of Systems    Patient is still recovering from her hospitalization in mid January for right upper lobe pneumonia Strength and appetite are slowly improved. No falls no change in vision or headache No bleeding complications from the  eliquis BP 126/77 (BP Location:  Left Arm, Patient Position: Sitting, Cuff Size: Normal)   Resp 16   Ht 4\' 11"  (1.499 m)   Wt 106 lb (48.1 kg)   SpO2 98% Comment: ON RA  BMI 21.41 kg/m  Physical Exam      Exam    General- alert and comfortable   Lungs- clear without rales, wheezes   Cor- regular rate and rhythm, no murmur , gallop   Abdomen- soft, non-tender   Extremities - warm, non-tender, minimal edema   Neuro- oriented, appropriate, no focal weakness   Diagnostic Tests: Chest x-ray performed today shows resolution of the right upper lobe pneumonia   patient has a slight kyphosis and mild-moderate dilatation of the thoracic aorta Probable small right pleural effusion with blunting of right costophrenic angle probably related to pulmonary hypertension right-sided failure  Impression: Patient's thoracic  aorta disease remains well-controlled. No indication for surgery. Continue current medications for blood pressure control and lipid control   patient will take a short 5 day course of Lasix for ankle edema  Plan:Return in 2 months with chest x-ray for review of her blood pressure and assessment   Len Childs, MD [facility] [phone]

## 2017-01-03 DIAGNOSIS — M810 Age-related osteoporosis without current pathological fracture: Secondary | ICD-10-CM | POA: Diagnosis not present

## 2017-01-03 DIAGNOSIS — I5033 Acute on chronic diastolic (congestive) heart failure: Secondary | ICD-10-CM | POA: Diagnosis not present

## 2017-01-03 DIAGNOSIS — I340 Nonrheumatic mitral (valve) insufficiency: Secondary | ICD-10-CM | POA: Diagnosis not present

## 2017-01-03 DIAGNOSIS — I4891 Unspecified atrial fibrillation: Secondary | ICD-10-CM | POA: Diagnosis not present

## 2017-01-03 DIAGNOSIS — M797 Fibromyalgia: Secondary | ICD-10-CM | POA: Diagnosis not present

## 2017-01-03 DIAGNOSIS — I110 Hypertensive heart disease with heart failure: Secondary | ICD-10-CM | POA: Diagnosis not present

## 2017-01-07 DIAGNOSIS — M810 Age-related osteoporosis without current pathological fracture: Secondary | ICD-10-CM | POA: Diagnosis not present

## 2017-01-07 DIAGNOSIS — I340 Nonrheumatic mitral (valve) insufficiency: Secondary | ICD-10-CM | POA: Diagnosis not present

## 2017-01-07 DIAGNOSIS — M797 Fibromyalgia: Secondary | ICD-10-CM | POA: Diagnosis not present

## 2017-01-07 DIAGNOSIS — I4891 Unspecified atrial fibrillation: Secondary | ICD-10-CM | POA: Diagnosis not present

## 2017-01-07 DIAGNOSIS — I110 Hypertensive heart disease with heart failure: Secondary | ICD-10-CM | POA: Diagnosis not present

## 2017-01-07 DIAGNOSIS — I5033 Acute on chronic diastolic (congestive) heart failure: Secondary | ICD-10-CM | POA: Diagnosis not present

## 2017-01-11 ENCOUNTER — Encounter (HOSPITAL_COMMUNITY): Payer: Medicare Other

## 2017-01-14 DIAGNOSIS — I110 Hypertensive heart disease with heart failure: Secondary | ICD-10-CM | POA: Diagnosis not present

## 2017-01-14 DIAGNOSIS — M810 Age-related osteoporosis without current pathological fracture: Secondary | ICD-10-CM | POA: Diagnosis not present

## 2017-01-14 DIAGNOSIS — I340 Nonrheumatic mitral (valve) insufficiency: Secondary | ICD-10-CM | POA: Diagnosis not present

## 2017-01-14 DIAGNOSIS — I5033 Acute on chronic diastolic (congestive) heart failure: Secondary | ICD-10-CM | POA: Diagnosis not present

## 2017-01-14 DIAGNOSIS — I4891 Unspecified atrial fibrillation: Secondary | ICD-10-CM | POA: Diagnosis not present

## 2017-01-14 DIAGNOSIS — M797 Fibromyalgia: Secondary | ICD-10-CM | POA: Diagnosis not present

## 2017-01-15 DIAGNOSIS — I110 Hypertensive heart disease with heart failure: Secondary | ICD-10-CM | POA: Diagnosis not present

## 2017-01-15 DIAGNOSIS — I4891 Unspecified atrial fibrillation: Secondary | ICD-10-CM | POA: Diagnosis not present

## 2017-01-15 DIAGNOSIS — I5033 Acute on chronic diastolic (congestive) heart failure: Secondary | ICD-10-CM | POA: Diagnosis not present

## 2017-01-15 DIAGNOSIS — I340 Nonrheumatic mitral (valve) insufficiency: Secondary | ICD-10-CM | POA: Diagnosis not present

## 2017-01-15 DIAGNOSIS — M810 Age-related osteoporosis without current pathological fracture: Secondary | ICD-10-CM | POA: Diagnosis not present

## 2017-01-15 DIAGNOSIS — M797 Fibromyalgia: Secondary | ICD-10-CM | POA: Diagnosis not present

## 2017-01-23 ENCOUNTER — Telehealth (HOSPITAL_COMMUNITY): Payer: Self-pay | Admitting: *Deleted

## 2017-01-23 NOTE — Telephone Encounter (Signed)
Patient given detailed instructions per Myocardial Perfusion Study Information Sheet for the test on 01/29/17. Patient notified to arrive 15 minutes early and that it is imperative to arrive on time for appointment to keep from having the test rescheduled.  If you need to cancel or reschedule your appointment, please call the office within 24 hours of your appointment. Failure to do so may result in a cancellation of your appointment, and a $50 no show fee. Patient verbalized understanding.  Leeb, Jaydan Chretien Jacqueline    

## 2017-01-29 ENCOUNTER — Ambulatory Visit (HOSPITAL_COMMUNITY): Payer: Medicare Other | Attending: Cardiology

## 2017-01-29 VITALS — Ht 59.0 in | Wt 106.0 lb

## 2017-01-29 DIAGNOSIS — I480 Paroxysmal atrial fibrillation: Secondary | ICD-10-CM

## 2017-01-29 DIAGNOSIS — R110 Nausea: Secondary | ICD-10-CM

## 2017-01-29 DIAGNOSIS — I5032 Chronic diastolic (congestive) heart failure: Secondary | ICD-10-CM | POA: Insufficient documentation

## 2017-01-29 LAB — MYOCARDIAL PERFUSION IMAGING
CHL CUP NUCLEAR SSS: 18
LV sys vol: 28 mL
LVDIAVOL: 102 mL (ref 46–106)
NUC STRESS TID: 1.01
Peak HR: 92 {beats}/min
RATE: 0.42
Rest HR: 65 {beats}/min
SDS: 6
SRS: 12

## 2017-01-29 MED ORDER — TECHNETIUM TC 99M TETROFOSMIN IV KIT
30.6000 | PACK | Freq: Once | INTRAVENOUS | Status: AC | PRN
  Administered 2017-01-29: 30.6 via INTRAVENOUS
  Filled 2017-01-29: qty 31

## 2017-01-29 MED ORDER — REGADENOSON 0.4 MG/5ML IV SOLN
0.4000 mg | Freq: Once | INTRAVENOUS | Status: AC
  Administered 2017-01-29: 0.4 mg via INTRAVENOUS

## 2017-01-29 MED ORDER — AMINOPHYLLINE 25 MG/ML IV SOLN
75.0000 mg | Freq: Once | INTRAVENOUS | Status: AC
  Administered 2017-01-29: 75 mg via INTRAVENOUS

## 2017-01-29 MED ORDER — TECHNETIUM TC 99M TETROFOSMIN IV KIT
10.2000 | PACK | Freq: Once | INTRAVENOUS | Status: AC | PRN
  Administered 2017-01-29: 10.2 via INTRAVENOUS
  Filled 2017-01-29: qty 11

## 2017-02-27 DIAGNOSIS — D509 Iron deficiency anemia, unspecified: Secondary | ICD-10-CM | POA: Diagnosis not present

## 2017-02-27 DIAGNOSIS — K219 Gastro-esophageal reflux disease without esophagitis: Secondary | ICD-10-CM | POA: Diagnosis not present

## 2017-02-27 DIAGNOSIS — S22000A Wedge compression fracture of unspecified thoracic vertebra, initial encounter for closed fracture: Secondary | ICD-10-CM | POA: Diagnosis not present

## 2017-02-27 DIAGNOSIS — I10 Essential (primary) hypertension: Secondary | ICD-10-CM | POA: Diagnosis not present

## 2017-02-27 DIAGNOSIS — Z1389 Encounter for screening for other disorder: Secondary | ICD-10-CM | POA: Diagnosis not present

## 2017-02-27 DIAGNOSIS — I4891 Unspecified atrial fibrillation: Secondary | ICD-10-CM | POA: Diagnosis not present

## 2017-02-27 DIAGNOSIS — Z0000 Encounter for general adult medical examination without abnormal findings: Secondary | ICD-10-CM | POA: Diagnosis not present

## 2017-02-27 DIAGNOSIS — N3281 Overactive bladder: Secondary | ICD-10-CM | POA: Diagnosis not present

## 2017-02-27 DIAGNOSIS — M810 Age-related osteoporosis without current pathological fracture: Secondary | ICD-10-CM | POA: Diagnosis not present

## 2017-02-27 DIAGNOSIS — E782 Mixed hyperlipidemia: Secondary | ICD-10-CM | POA: Diagnosis not present

## 2017-02-27 DIAGNOSIS — E039 Hypothyroidism, unspecified: Secondary | ICD-10-CM | POA: Diagnosis not present

## 2017-03-05 ENCOUNTER — Other Ambulatory Visit: Payer: Self-pay | Admitting: Cardiothoracic Surgery

## 2017-03-05 DIAGNOSIS — I509 Heart failure, unspecified: Secondary | ICD-10-CM

## 2017-03-06 ENCOUNTER — Encounter: Payer: Self-pay | Admitting: Cardiothoracic Surgery

## 2017-03-06 ENCOUNTER — Encounter: Payer: Medicare Other | Admitting: Cardiothoracic Surgery

## 2017-03-06 ENCOUNTER — Ambulatory Visit
Admission: RE | Admit: 2017-03-06 | Discharge: 2017-03-06 | Disposition: A | Discharge status: Home / Self Care | Payer: Medicare Other | Source: Ambulatory Visit | Attending: Cardiothoracic Surgery | Admitting: Cardiothoracic Surgery

## 2017-03-06 DIAGNOSIS — I509 Heart failure, unspecified: Secondary | ICD-10-CM | POA: Diagnosis not present

## 2017-03-06 NOTE — Progress Notes (Signed)
This encounter was created in error - please disregard.

## 2017-03-13 DIAGNOSIS — N3281 Overactive bladder: Secondary | ICD-10-CM | POA: Diagnosis not present

## 2017-03-19 DIAGNOSIS — D2239 Melanocytic nevi of other parts of face: Secondary | ICD-10-CM | POA: Diagnosis not present

## 2017-03-19 DIAGNOSIS — L821 Other seborrheic keratosis: Secondary | ICD-10-CM | POA: Diagnosis not present

## 2017-03-19 DIAGNOSIS — Z85828 Personal history of other malignant neoplasm of skin: Secondary | ICD-10-CM | POA: Diagnosis not present

## 2017-03-19 DIAGNOSIS — D485 Neoplasm of uncertain behavior of skin: Secondary | ICD-10-CM | POA: Diagnosis not present

## 2017-03-20 ENCOUNTER — Encounter: Payer: Medicare Other | Admitting: Cardiothoracic Surgery

## 2017-03-25 DIAGNOSIS — N8111 Cystocele, midline: Secondary | ICD-10-CM | POA: Diagnosis not present

## 2017-03-25 DIAGNOSIS — N814 Uterovaginal prolapse, unspecified: Secondary | ICD-10-CM | POA: Diagnosis not present

## 2017-03-27 ENCOUNTER — Ambulatory Visit (INDEPENDENT_AMBULATORY_CARE_PROVIDER_SITE_OTHER): Payer: Medicare Other | Admitting: Cardiology

## 2017-03-27 ENCOUNTER — Telehealth: Payer: Self-pay | Admitting: Cardiology

## 2017-03-27 ENCOUNTER — Other Ambulatory Visit: Payer: Self-pay | Admitting: *Deleted

## 2017-03-27 ENCOUNTER — Encounter: Payer: Self-pay | Admitting: Cardiology

## 2017-03-27 ENCOUNTER — Encounter: Payer: Medicare Other | Admitting: Cardiothoracic Surgery

## 2017-03-27 VITALS — BP 110/54 | HR 71 | Ht 59.0 in | Wt 107.0 lb

## 2017-03-27 DIAGNOSIS — I712 Thoracic aortic aneurysm, without rupture, unspecified: Secondary | ICD-10-CM

## 2017-03-27 DIAGNOSIS — I480 Paroxysmal atrial fibrillation: Secondary | ICD-10-CM | POA: Diagnosis not present

## 2017-03-27 DIAGNOSIS — I10 Essential (primary) hypertension: Secondary | ICD-10-CM

## 2017-03-27 DIAGNOSIS — I340 Nonrheumatic mitral (valve) insufficiency: Secondary | ICD-10-CM | POA: Diagnosis not present

## 2017-03-27 DIAGNOSIS — I5033 Acute on chronic diastolic (congestive) heart failure: Secondary | ICD-10-CM | POA: Diagnosis not present

## 2017-03-27 DIAGNOSIS — I7121 Aneurysm of the ascending aorta, without rupture: Secondary | ICD-10-CM

## 2017-03-27 MED ORDER — FLECAINIDE ACETATE 50 MG PO TABS: 50.0000 mg | ORAL_TABLET | Freq: Two times a day (BID) | ORAL | 3 refills | Status: DC

## 2017-03-27 MED ORDER — FUROSEMIDE 20 MG PO TABS: 20.0000 mg | ORAL_TABLET | ORAL | 3 refills | Status: DC

## 2017-03-27 MED ORDER — APIXABAN 5 MG PO TABS: 5.0000 mg | ORAL_TABLET | Freq: Two times a day (BID) | ORAL | 3 refills | Status: DC

## 2017-03-27 MED ORDER — FUROSEMIDE 20 MG PO TABS: 20.0000 mg | ORAL_TABLET | Freq: Every day | ORAL | 3 refills | Status: DC

## 2017-03-27 MED ORDER — DILTIAZEM HCL ER COATED BEADS 180 MG PO CP24: 180.0000 mg | ORAL_CAPSULE | Freq: Every day | ORAL | 3 refills | Status: DC

## 2017-03-27 NOTE — Patient Instructions (Addendum)
Medication Instructions:   DECREASE YOUR CARDIZEM CD TO 180 MG ONCE DAILY  START TAKING LASIX 20 MG EVERY OTHER DAY     Follow-Up:  3 MONTHS WITH DR Meda Coffee       If you need a refill on your cardiac medications before your next appointment, please call your pharmacy.

## 2017-03-27 NOTE — Telephone Encounter (Signed)
Dr Meda Coffee, this pt wants to know should she be on K-Dur with newly prescribed lasix 20 mg po Every other day? Pt calling to confirm.  We saw her today and prescribed her low dose lasix.  Please advise.   Thanks

## 2017-03-27 NOTE — Telephone Encounter (Signed)
Patient calling, states that she was prescribed lasix but would like to verify if she should take potassium as well. Please call to discuss,thanks.

## 2017-03-27 NOTE — Telephone Encounter (Signed)
Pt saw Dr Meda Coffee today. Wt 48.5Kg. SCr on 12/10/16 was 0.73. Age 81 yrs old. Will refill Eliquis 5mg  BID.

## 2017-03-27 NOTE — Progress Notes (Signed)
Cardiology Office Note    Date:  Apr 05, 2017  ID:  [name], DOB July 21, 1936, MRN [id number] PCP:  [name], MD  Cardiologist:  New to Dr. Newt Minion. [place]   Chief Complaint: f/u atrial fib  History of Present Illness:  [name] is a 81 y.o. female with history of osteoporosis, hyperlipidemia, hypertension, hypothyroidism, breast Ca (lumpx/ XRT 2012-3), DVT in 2012, GERD, 4.3cm ascending thoracic aneurysm (followed by Dr. Prescott Gum), anxiety, and recently diagnosed atrial fib/diastolic CHF/mitral regurg who presents for post-hospital follow-up. She was admitted in 11/2016 with SOB and found to have rapid atrial fib in the setting of acute respiratory failure with hypoxia and PNA. She was also treated for presumed diastolic CHF with IV Lasix. 2D echo 11/28/16: mild LVH, EF 55-60%, mild-mod AI, mod MR, severe LAE, mod RAE, severe TR, PASP 67. 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 She spontaneously converted to NSR on diltiazem and flecainide. At present time it is felt she does not have indication for surgery at this time but ultimately may require mitral valve repair with MAZE in the future. Last BMET/CBC 12/10/16 wnl, Cr 0.73, Hgb 13.0. She was continued on prior Eliquis. Remote nuc 2012 wnl.  She presents back for follow-up overall doing well. She does feel tired at times. She is trying to build up her endurance.  She denies any CP, SOB, palpitations, syncope or bleeding. + intermittent mild LEE. She does admit she likes sea salt. States she saw her PCP who did bloodwork but she hasn't heard back yet. She has many questions about her recent diagnoses and I tried my best to answer all of them. Her husband died in 09-20-2023.  Apr 05, 2017 - this is 3 months follow-up, the patient states that she feels tired  without energy all the time, this is significantly worse when compared to a year ago. She denies any chest pain or SOB, no palpitations. No bleeding. She has on and off LE edema in her feet and ankles. Ocassional dizziness, no falls.   Past Medical History:  Diagnosis Date  . Anxiety   . Aortic regurgitation   . Breast cancer ([place]) 08/20/2011   R breast DCIS, ER/PR +  . Cataract 3 and 20-Sep-1991   bilateral  . Chronic diastolic CHF (congestive heart failure) ([place])   . DVT (deep venous thrombosis) ([place]) 09/20/11   LL extremity   . Fibromyalgia   . Fracture lumbar vertebra-closed ([place])   . Fracture of thoracic vertebra, closed ([place])   . GERD (gastroesophageal reflux disease)   . H/O hiatal hernia   . History of blood clots   . History of radiation therapy 01/2012   R breast  . Hypercholesteremia   . Hypertension    DR Orinda Kenner  . Hypothyroidism   . Mitral regurgitation   . Osteoporosis   . PAF (paroxysmal atrial fibrillation) ([place])    a. dx 11/2016.  . Rib fractures   . Thoracic ascending aortic aneurysm ([place])    a. followed by Dr. Prescott Gum.    Past Surgical History:  Procedure Laterality Date  . ANTERIOR CERVICAL DECOMP/DISCECTOMY FUSION N/A 09/09/2015   Procedure: Anterior Cervical Decompression and Fusion Cervical seven-Thorasic one ;  Surgeon: Erline Levine, MD;  Location: [facility] NEURO ORS;  Service: Neurosurgery;  Laterality: N/A;  . APPENDECTOMY  1940  .  BREAST SURGERY    . CATARACT EXTRACTION  1992  . EYE SURGERY  1940, 1956  . HERNIA REPAIR  10/16/2006   [facility] - Dr Hassell Done  . KYPHOPLASTY N/A 01/26/2013   Procedure: KYPHOPLASTY;  Surgeon: Kristeen Miss, MD;  Location: [facility] NEURO ORS;  Service: Neurosurgery;  Laterality: N/A;  T11 and L1  . MASTECTOMY, PARTIAL  10/17/2011   Procedure: MASTECTOMY PARTIAL;  Surgeon: Haywood Lasso, MD;  Location: [facility];  Service: General;  Laterality: Right;  needle guided  . TEE WITHOUT CARDIOVERSION N/A 12/04/2016   Procedure: TRANSESOPHAGEAL  ECHOCARDIOGRAM (TEE);  Surgeon: Pixie Casino, MD;  Location: [facility] ENDOSCOPY;  Service: Cardiovascular;  Laterality: N/A;  . TONSILLECTOMY  1944    Current Medications: Current Outpatient Prescriptions  Medication Sig Dispense Refill  . apixaban (ELIQUIS) 5 MG TABS tablet Take 1 tablet (5 mg total) by mouth 2 (two) times daily. 180 tablet 0  . BONIVA 150 MG tablet Take 150 mg by mouth every 30 (thirty) days.     . calcium citrate-vitamin D (CITRACAL+D) 315-200 MG-UNIT per tablet Take 1 tablet by mouth 2 (two) times daily. 60 tablet 11  . cholecalciferol (VITAMIN D) 1000 UNITS tablet Take 1,000 Units by mouth daily. Vitamin D3    . diltiazem (CARDIZEM CD) 300 MG 24 hr capsule Take 1 capsule (300 mg total) by mouth daily. 90 capsule 0  . flecainide (TAMBOCOR) 50 MG tablet Take 1 tablet (50 mg total) by mouth every 12 (twelve) hours. 180 tablet 0  . HYDROcodone-acetaminophen (NORCO/VICODIN) 5-325 MG tablet Take 1-2 tablets by mouth every 4 (four) hours as needed (mild pain). 30 tablet 0  . levothyroxine (SYNTHROID, LEVOTHROID) 125 MCG tablet Take 125 mcg by mouth daily before breakfast.    . Multiple Vitamin (MULTIVITAMIN) tablet Take 1 tablet by mouth daily.     Marland Kitchen omeprazole (PRILOSEC OTC) 20 MG tablet Take 20 mg by mouth daily.    . pravastatin (PRAVACHOL) 20 MG tablet Take 20 mg by mouth every evening.     . solifenacin (VESICARE) 5 MG tablet Take 5 mg by mouth daily.     No current facility-administered medications for this visit.      Allergies:   Contrast media [iodinated diagnostic agents]; Iohexol; and Penicillins   Social History   Social History  . Marital status: Married    Spouse name: N/A  . Number of children: N/A  . Years of education: N/A   Social History Main Topics  . Smoking status: Never Smoker  . Smokeless tobacco: Never Used  . Alcohol use No  . Drug use: No  . Sexual activity: No   Other Topics Concern  . None   Social History Narrative  . None      Family History:  The patient's family history includes Heart disease in her maternal grandfather, maternal uncle, and mother.   ROS:   Please see the history of present illness.  All other systems are reviewed and otherwise negative.    PHYSICAL EXAM:   VS:  BP (!) 110/54   Pulse 71   Ht 4\' 11"  (1.499 m)   Wt 107 lb (48.5 kg)   SpO2 97%   BMI 21.61 kg/m   BMI: Body mass index is 21.61 kg/m. GEN: Thin [name] in no acute distress  HEENT: normocephalic, atraumatic, + strabismus Neck: no JVD, carotid bruits, or masses Cardiac: RRR; soft SEM at apex, no rubs or gallops, minimal pedal edema bilaterally Respiratory:  clear  to auscultation bilaterally, normal work of breathing GI: soft, nontender, nondistended, + BS MS: significant kyphosis Skin: warm and dry, no rash Neuro:  Alert and Oriented x 3, Strength and sensation are intact, follows commands Psych: euthymic mood, full affect  Wt Readings from Last 3 Encounters:  03/27/17 107 lb (48.5 kg)  01/29/17 106 lb (48.1 kg)  01/01/17 106 lb (48.1 kg)      Studies/Labs Reviewed:   EKG:  EKG was ordered today and personally reviewed by me and demonstrates NSR 67bpm, possible LAE, LAFB, prior anterior infarct possibly noted, QTc 492ms  Recent Labs: 11/27/2016: B Natriuretic Peptide 443.6; TSH 2.994 11/29/2016: Magnesium 1.9 12/10/2016: BUN 17; Creatinine, Ser 0.73; Hemoglobin 13.0; Platelets 289; Potassium 4.1; Sodium 136   Additional studies/ records that were reviewed today include: Summarized above.    ASSESSMENT & PLAN:   1. Paroxysmal atrial fib - maintaining NSR on diltiazem and flecainide. 2D echo showed normal LVEF. BP low, I will decrease the dose Diltiazem to 180 mg po daily. 2. Mitral regurgitation - at least moderate on echocardiogram, she will follow with Dr Lucianne Lei Trigt's recommendations at the next week visit.  3. Acute on chronic diastolic CHF - we will start lasix 20 mg po every other day 4. Severe  pulmonary hypertension - most probably sec to mitral regurgitation. 5. Essential HTN - controlled. 6. Thoracic aortic aneurysm - repeat chest CT should be done in July 2018, she will follow with Dr    Disposition: F/u with Dr. Meda Coffee in 3 months.   Medication Adjustments/Labs and Tests Ordered: Current medicines are reviewed at length with the patient today.  Concerns regarding medicines are outlined above. Medication changes, Labs and Tests ordered today are summarized above and listed in the Patient Instructions accessible in Encounters.   Raechel Ache PA-C  03/27/2017 11:48 AM    [address], [place], [place]  [address] Phone: [phone]; Fax: [phone]

## 2017-03-28 NOTE — Telephone Encounter (Signed)
With such a low dose of Lasix she doesn't have to take potassium just increased potassium in her diet.

## 2017-03-28 NOTE — Telephone Encounter (Signed)
Notified the pt that per Dr Meda Coffee, with the low dose of lasix, she doesn't have to take K, just increase it in her diet.  Pt education provided on how to increase K in her diet. Pt verbalized understanding and agrees with this plan.

## 2017-04-03 ENCOUNTER — Encounter: Payer: Medicare Other | Admitting: Cardiothoracic Surgery

## 2017-04-04 ENCOUNTER — Other Ambulatory Visit: Payer: Self-pay | Admitting: Cardiothoracic Surgery

## 2017-04-04 DIAGNOSIS — I7121 Aneurysm of the ascending aorta, without rupture: Secondary | ICD-10-CM

## 2017-04-04 DIAGNOSIS — I712 Thoracic aortic aneurysm, without rupture: Secondary | ICD-10-CM

## 2017-04-05 ENCOUNTER — Encounter: Payer: Self-pay | Admitting: Cardiothoracic Surgery

## 2017-04-05 ENCOUNTER — Ambulatory Visit
Admission: RE | Admit: 2017-04-05 | Discharge: 2017-04-05 | Disposition: A | Discharge status: Home / Self Care | Payer: Medicare Other | Source: Ambulatory Visit | Attending: Cardiothoracic Surgery | Admitting: Cardiothoracic Surgery

## 2017-04-05 ENCOUNTER — Ambulatory Visit (INDEPENDENT_AMBULATORY_CARE_PROVIDER_SITE_OTHER): Payer: Medicare Other | Admitting: Cardiothoracic Surgery

## 2017-04-05 VITALS — BP 140/87 | HR 74 | Resp 20 | Ht 59.0 in | Wt 107.0 lb

## 2017-04-05 DIAGNOSIS — Z853 Personal history of malignant neoplasm of breast: Secondary | ICD-10-CM

## 2017-04-05 DIAGNOSIS — I712 Thoracic aortic aneurysm, without rupture: Secondary | ICD-10-CM

## 2017-04-05 DIAGNOSIS — I7121 Aneurysm of the ascending aorta, without rupture: Secondary | ICD-10-CM

## 2017-04-05 NOTE — Progress Notes (Signed)
PCP is [name], MD Referring Provider is [name], MD  Chief Complaint  Patient presents with  . Thoracic Aortic Aneurysm    3 month f/u with CXR    HPI: The patient returns for scheduled 2 month follow-up for blood pressure check and to assess her ankle edema The patient has a stable 4.6 cm fusiform ascending aneurysm. She is 81 years old with severe kyphosis. She would not survive surgery and blood pressure control is her best therapy. She is currently taking diltiazem dose by her cardiologist, Dr. Meda Coffee she is also taking Lasix 20 mg daily to help with some ankle edema and pulmonary venous congestion. She has normal LV with LVH and 1+ AI  Past Medical History:  Diagnosis Date  . Anxiety   . Aortic regurgitation   . Breast cancer ([place]) 08/20/2011   R breast DCIS, ER/PR +  . Cataract 3 and 10/92   bilateral  . Chronic diastolic CHF (congestive heart failure) ([place])   . DVT (deep venous thrombosis) ([place]) 08/2011   LL extremity   . Fibromyalgia   . Fracture lumbar vertebra-closed ([place])   . Fracture of thoracic vertebra, closed ([place])   . GERD (gastroesophageal reflux disease)   . H/O hiatal hernia   . History of blood clots   . History of radiation therapy 01/2012   R breast  . Hypercholesteremia   . Hypertension    DR Orinda Kenner  . Hypothyroidism   . Mitral regurgitation   . Osteoporosis   . PAF (paroxysmal atrial fibrillation) ([place])    a. dx 11/2016.  . Rib fractures   . Thoracic ascending aortic aneurysm ([place])    a. followed by Dr. Prescott Gum.    Past Surgical History:  Procedure Laterality Date  . ANTERIOR CERVICAL DECOMP/DISCECTOMY FUSION N/A 09/09/2015   Procedure: Anterior Cervical Decompression and Fusion Cervical seven-Thorasic one ;  Surgeon: Erline Levine, MD;  Location: [facility] NEURO ORS;  Service: Neurosurgery;  Laterality: N/A;  . APPENDECTOMY  1940  . BREAST SURGERY    . CATARACT EXTRACTION  1992  . EYE SURGERY  1940, 1956  . HERNIA REPAIR  10/16/2006    [facility] - Dr Hassell Done  . KYPHOPLASTY N/A 01/26/2013   Procedure: KYPHOPLASTY;  Surgeon: Kristeen Miss, MD;  Location: [facility] NEURO ORS;  Service: Neurosurgery;  Laterality: N/A;  T11 and L1  . MASTECTOMY, PARTIAL  10/17/2011   Procedure: MASTECTOMY PARTIAL;  Surgeon: Haywood Lasso, MD;  Location: [facility];  Service: General;  Laterality: Right;  needle guided  . TEE WITHOUT CARDIOVERSION N/A 12/04/2016   Procedure: TRANSESOPHAGEAL ECHOCARDIOGRAM (TEE);  Surgeon: Pixie Casino, MD;  [facility] END[facility];  Service: Cardiovascular;  Laterality: N/A;  . TONSILLECTOMY  1944    Family History  Problem Relation Age of Onset  . Heart disease Mother   . Heart disease Maternal Uncle   . Heart disease Maternal Grandfather     Social History Social History  Substance Use Topics  . Smoking status: Never Smoker  . Smokeless tobacco: Never Used  . Alcohol use No    Current Outpatient Prescriptions  Medication Sig Dispense Refill  . apixaban (ELIQUIS) 5 MG TABS tablet Take 1 tablet (5 mg total) by mouth 2 (two) times daily. 180 tablet 3  . BONIVA 150 MG tablet Take 150 mg by mouth every 30 (thirty) days.     . calcium citrate-vitamin D (CITRACAL+D) 315-200 MG-UNIT per tablet Take 1 tablet by mouth 2 (two) times daily.  60 tablet 11  . cholecalciferol (VITAMIN D) 1000 UNITS tablet Take 1,000 Units by mouth daily. Vitamin D3    . diltiazem (CARDIZEM CD) 180 MG 24 hr capsule Take 1 capsule (180 mg total) by mouth daily. 90 capsule 3  . flecainide (TAMBOCOR) 50 MG tablet Take 1 tablet (50 mg total) by mouth every 12 (twelve) hours. 180 tablet 3  . furosemide (LASIX) 20 MG tablet Take 1 tablet (20 mg total) by mouth every other day. 45 tablet 3  . HYDROcodone-acetaminophen (NORCO/VICODIN) 5-325 MG tablet Take 1-2 tablets by mouth every 4 (four) hours as needed (mild pain). 30 tablet 0  . levothyroxine (SYNTHROID, LEVOTHROID) 125 MCG tablet Take 125 mcg by mouth daily before breakfast.    . Multiple Vitamin  (MULTIVITAMIN) tablet Take 1 tablet by mouth daily.     Marland Kitchen omeprazole (PRILOSEC OTC) 20 MG tablet Take 20 mg by mouth daily.    . pravastatin (PRAVACHOL) 20 MG tablet Take 20 mg by mouth every evening.     . solifenacin (VESICARE) 5 MG tablet Take 5 mg by mouth daily.     No current facility-administered medications for this visit.     Allergies  Allergen Reactions  . Contrast Media [Iodinated Diagnostic Agents] Shortness Of Breath  . Iohexol Shortness Of Breath  . Penicillins Rash    Has patient had a PCN reaction causing immediate rash, facial/tongue/throat swelling, SOB or lightheadedness with hypotension: No Has patient had a PCN reaction causing severe rash involving mucus membranes or skin necrosis: No Has patient had a PCN reaction that required hospitalization No Has patient had a PCN reaction occurring within the last 10 years: No If all of the above answers are "NO", then may proceed with Cephalosporin use.     Review of Systems  No shortness of breath Complains of fatigue since she is been placed on diltiazem and flecainide No orthopnea No chest or back pain No falls No syncope  BP 140/87   Pulse 74   Resp 20   Ht 4\' 11"  (1.499 m)   Wt 107 lb (48.5 kg)   SpO2 99% Comment: RA  BMI 21.61 kg/m  Physical Exam      Exam    General- alert and comfortable   Lungs- clear without rales, wheezes   Cor- regular rate and rhythm, no murmur , gallop   Abdomen- soft, non-tender   Extremities - warm, non-tender, minimal edema   Neuro- oriented, appropriate, no focal weakness   Diagnostic Tests: Chest x-ray images personally reviewed showing severe kyphosis and stable fusiform ascending aneurysm, stable mediastinal silhouette. She has a moderate hiatal hernia.  Impression: Fusiform thoracic aneurysm stable at 4.6 cm, asymptomatic Blood pressure and diastolic heart failure being managed by her cardiologist. I will see her for review of blood pressure and her thoracic  aorta with chest x-ray in one year.  Plan:   Len Childs, MD [facility] [phone]

## 2017-04-11 DIAGNOSIS — H52203 Unspecified astigmatism, bilateral: Secondary | ICD-10-CM | POA: Diagnosis not present

## 2017-04-11 DIAGNOSIS — H31003 Unspecified chorioretinal scars, bilateral: Secondary | ICD-10-CM | POA: Diagnosis not present

## 2017-04-11 DIAGNOSIS — Z961 Presence of intraocular lens: Secondary | ICD-10-CM | POA: Diagnosis not present

## 2017-04-16 ENCOUNTER — Ambulatory Visit (INDEPENDENT_AMBULATORY_CARE_PROVIDER_SITE_OTHER): Payer: Medicare Other | Admitting: Orthopaedic Surgery

## 2017-04-16 ENCOUNTER — Encounter (INDEPENDENT_AMBULATORY_CARE_PROVIDER_SITE_OTHER): Payer: Self-pay | Admitting: Orthopaedic Surgery

## 2017-04-16 ENCOUNTER — Ambulatory Visit (INDEPENDENT_AMBULATORY_CARE_PROVIDER_SITE_OTHER): Payer: Medicare Other

## 2017-04-16 VITALS — BP 131/84 | HR 70 | Ht 59.0 in | Wt 107.0 lb

## 2017-04-16 DIAGNOSIS — M19042 Primary osteoarthritis, left hand: Secondary | ICD-10-CM | POA: Diagnosis not present

## 2017-04-16 DIAGNOSIS — M1812 Unilateral primary osteoarthritis of first carpometacarpal joint, left hand: Secondary | ICD-10-CM | POA: Diagnosis not present

## 2017-04-16 MED ORDER — METHYLPREDNISOLONE ACETATE 40 MG/ML IJ SUSP
13.3300 mg | INTRAMUSCULAR | Status: AC | PRN
  Administered 2017-04-16: 13.33 mg via INTRA_ARTICULAR

## 2017-04-16 MED ORDER — LIDOCAINE HCL 1 % IJ SOLN
0.3000 mL | INTRAMUSCULAR | Status: AC | PRN
  Administered 2017-04-16: .3 mL

## 2017-04-16 MED ORDER — BUPIVACAINE HCL 0.5 % IJ SOLN
0.3300 mL | INTRAMUSCULAR | Status: AC | PRN
  Administered 2017-04-16: .33 mL via INTRA_ARTICULAR

## 2017-04-16 NOTE — Progress Notes (Signed)
Office Visit Note   Patient: [name]           Date of Birth: [birth year]           MRN: [id number] Visit Date: 04/16/2017              Requested by: [name], MD [address] [place], [place] [address] PCP: [name], MD   Assessment & Plan: Visit Diagnoses:  1. Primary osteoarthritis, left hand   2. Arthritis of carpometacarpal (CMC) joint of left thumb     Plan:  #1: Corticosteroid injection to the left first CMC joint. #2: Sent her over to [employer] for a [place] wrap #3: Spoke to her about possibly an evaluation by the hand surgeons for that and also some subluxation of a tendon of the index finger.  Follow-Up Instructions: Return if symptoms worsen or fail to improve.   Orders:  Orders Placed This Encounter  Procedures  . XR Hand Complete Left   No orders of the defined types were placed in this encounter.     Procedures: Small Joint Inj Date/Time: 04/16/2017 1:23 PM Performed by: Biagio Borg D Authorized by: Biagio Borg D   Consent Given by:  Patient Site marked: the procedure site was marked   Timeout: prior to procedure the correct patient, procedure, and site was verified   Indications:  Pain Location:  Thumb Site:  L thumb CMC Prep: patient was prepped and draped in usual sterile fashion   Needle Size:  25 G Spinal Needle: No   Approach:  Dorsal Ultrasound Guided: No   Fluoroscopic Guidance: No   Medications:  0.3 mL lidocaine 1 %; 0.33 mL bupivacaine 0.5 %; 13.33 mg methylPREDNISolone acetate 40 MG/ML Aspiration Attempted: No       Clinical Data: No additional findings.   Subjective: Chief Complaint  Patient presents with  . Left Thumb - Pain    [name] is seen today for evaluation of her left hand. She's been having pain in the left thumb and into the wrist area. His wound will not for some time. She's had problems apparently in the past with this. She denies any recent history of injury or trauma. Her  pain is centered around the [facility] joint as well as the MCP joint but that is much less than the [facility].     Review of Systems  Constitutional: Negative.   HENT: Negative.   Respiratory: Negative.   Cardiovascular: Negative.   Gastrointestinal: Negative.   Genitourinary: Negative.   Skin: Negative.   Neurological: Negative.   Hematological: Negative.   Psychiatric/Behavioral: Negative.      Objective: Vital Signs: BP 131/84   Pulse 70   Ht 4\' 11"  (1.499 m)   Wt 107 lb (48.5 kg)   BMI 21.61 kg/m   Physical Exam  Ortho Exam  The left hand reveals visually a subluxation of the first [facility] joint. I am able to reduce this. This does cause her some pain. Her main pain she complains about is at the M CPAP joint but this is nontender to palpation. Is ligamentously stable. She really has little bit of increase in girth of that but not particularly swollen.  Specialty Comments:  No specialty comments available.  Imaging: Xr Hand Complete Left  Result Date: 04/16/2017 4 view x-ray of the left hand reveals significant CMC sclerosis and subluxation of the first [facility] joint. She also has some marked joint space narrowing ulnar aspect of the MCP joint  of the thumb. She has swan neck deformities of all her fingers.    PMFS History: Patient Active Problem List   Diagnosis Date Noted  . Ascending aortic aneurysm ([place]) 03/27/2017  . PAF (paroxysmal atrial fibrillation) ([place]) 12/21/2016  . Chronic diastolic CHF (congestive heart failure) ([place]) 12/21/2016  . Mitral valve regurgitation   . Malnutrition of moderate degree 12/01/2016  . Hypokalemia   . Lobar pneumonia, unspecified organism ([place])   . Leukocytosis   . Herniated cervical intervertebral disc 09/09/2015  . Cervical radiculopathy   . Neoplasm of right breast, primary tumor staging category Tis: ductal carcinoma in situ (DCIS) 09/03/2013  . Dyspnea 01/21/2013  . Bilateral leg edema 01/21/2013  . History of DVT of lower extremity  01/21/2013  . Back pain, lumbosacral 01/21/2013  . Osteoporosis, unspecified 01/21/2013  . Essential hypertension 01/21/2013  . Hypothyroidism 01/21/2013  . History of blood clots   . Cataract   . DVT (deep venous thrombosis) ([place]) 09/07/2011   Past Medical History:  Diagnosis Date  . Anxiety   . Aortic regurgitation   . Breast cancer ([place]) 08/20/2011   R breast DCIS, ER/PR +  . Cataract 3 and 10/92   bilateral  . Chronic diastolic CHF (congestive heart failure) ([place])   . DVT (deep venous thrombosis) ([place]) 08/2011   LL extremity   . Fibromyalgia   . Fracture lumbar vertebra-closed ([place])   . Fracture of thoracic vertebra, closed ([place])   . GERD (gastroesophageal reflux disease)   . H/O hiatal hernia   . History of blood clots   . History of radiation therapy 01/2012   R breast  . Hypercholesteremia   . Hypertension    DR Orinda Kenner  . Hypothyroidism   . Mitral regurgitation   . Osteoporosis   . PAF (paroxysmal atrial fibrillation) ([place])    a. dx 11/2016.  . Rib fractures   . Thoracic ascending aortic aneurysm ([place])    a. followed by Dr. Prescott Gum.    Family History  Problem Relation Age of Onset  . Heart disease Mother   . Heart disease Maternal Uncle   . Heart disease Maternal Grandfather     Past Surgical History:  Procedure Laterality Date  . ANTERIOR CERVICAL DECOMP/DISCECTOMY FUSION N/A 09/09/2015   Procedure: Anterior Cervical Decompression and Fusion Cervical seven-Thorasic one ;  Surgeon: Erline Levine, MD;  Location: [facility]UR[facility]S;  Service: Neurosurgery;  Laterality: N/A;  . APPENDECTOMY  1940  . BREAST SURGERY    . CATARACT EXTRACTION  1992  . EYE SURGERY  1940, 1956  . HERNIA REPAIR  10/16/2006   [facility] - Dr Hassell Done  . KYPHOPLASTY N/A 01/26/2013   Procedure: KYPHOPLASTY;  Surgeon: Kristeen Miss, MD;  Location: [facility] NEURO ORS;  Service: Neurosurgery;  Laterality: N/A;  T11 and L1  . MASTECTOMY, PARTIAL  10/17/2011   Procedure: MASTECTOMY PARTIAL;  Surgeon:  Haywood Lasso, MD;  Location: [facility];  Service: General;  Laterality: Right;  needle guided  . TEE WITHOUT CARDIOVERSION N/A 12/04/2016   Procedure: TRANSESOPHAGEAL ECHOCARDIOGRAM (TEE);  Surgeon: Pixie Casino, MD;  Location: [facility];  Service: Cardiovascular;  Laterality: N/A;  . TONSILLECTOMY  1944   Social History   Occupational History  . Not on file.   Social History Main Topics  . Smoking status: Never Smoker  . Smokeless tobacco: Never Used  . Alcohol use No  . Drug use: No  . Sexual activity: No

## 2017-05-01 DIAGNOSIS — E039 Hypothyroidism, unspecified: Secondary | ICD-10-CM | POA: Diagnosis not present

## 2017-05-26 ENCOUNTER — Emergency Department (HOSPITAL_COMMUNITY)
Admission: EM | Admit: 2017-05-26 | Discharge: 2017-05-26 | Disposition: A | Discharge status: Home / Self Care | Payer: Medicare Other | Attending: Emergency Medicine | Admitting: Emergency Medicine

## 2017-05-26 ENCOUNTER — Emergency Department (HOSPITAL_COMMUNITY): Payer: Medicare Other

## 2017-05-26 ENCOUNTER — Encounter (HOSPITAL_COMMUNITY): Payer: Self-pay | Admitting: Emergency Medicine

## 2017-05-26 DIAGNOSIS — E039 Hypothyroidism, unspecified: Secondary | ICD-10-CM | POA: Diagnosis not present

## 2017-05-26 DIAGNOSIS — M62838 Other muscle spasm: Secondary | ICD-10-CM | POA: Diagnosis not present

## 2017-05-26 DIAGNOSIS — I110 Hypertensive heart disease with heart failure: Secondary | ICD-10-CM | POA: Insufficient documentation

## 2017-05-26 DIAGNOSIS — M546 Pain in thoracic spine: Secondary | ICD-10-CM | POA: Diagnosis not present

## 2017-05-26 DIAGNOSIS — M545 Low back pain: Secondary | ICD-10-CM | POA: Diagnosis not present

## 2017-05-26 DIAGNOSIS — R1031 Right lower quadrant pain: Secondary | ICD-10-CM | POA: Diagnosis present

## 2017-05-26 DIAGNOSIS — K2970 Gastritis, unspecified, without bleeding: Secondary | ICD-10-CM | POA: Diagnosis not present

## 2017-05-26 DIAGNOSIS — Z7901 Long term (current) use of anticoagulants: Secondary | ICD-10-CM | POA: Diagnosis not present

## 2017-05-26 DIAGNOSIS — Z853 Personal history of malignant neoplasm of breast: Secondary | ICD-10-CM | POA: Insufficient documentation

## 2017-05-26 DIAGNOSIS — Z880 Allergy status to penicillin: Secondary | ICD-10-CM | POA: Diagnosis not present

## 2017-05-26 DIAGNOSIS — I5032 Chronic diastolic (congestive) heart failure: Secondary | ICD-10-CM | POA: Diagnosis not present

## 2017-05-26 DIAGNOSIS — R10817 Generalized abdominal tenderness: Secondary | ICD-10-CM | POA: Diagnosis not present

## 2017-05-26 DIAGNOSIS — Z79899 Other long term (current) drug therapy: Secondary | ICD-10-CM | POA: Diagnosis not present

## 2017-05-26 LAB — CBC WITH DIFFERENTIAL/PLATELET
BASOS ABS: 0 10*3/uL (ref 0.0–0.1)
Basophils Relative: 0 %
EOS PCT: 1 %
Eosinophils Absolute: 0.1 10*3/uL (ref 0.0–0.7)
HEMATOCRIT: 42.9 % (ref 36.0–46.0)
Hemoglobin: 14.6 g/dL (ref 12.0–15.0)
LYMPHS PCT: 11 %
Lymphs Abs: 1.2 10*3/uL (ref 0.7–4.0)
MCH: 29 pg (ref 26.0–34.0)
MCHC: 34 g/dL (ref 30.0–36.0)
MCV: 85.1 fL (ref 78.0–100.0)
Monocytes Absolute: 1.2 10*3/uL — ABNORMAL HIGH (ref 0.1–1.0)
Monocytes Relative: 10 %
NEUTROS ABS: 8.8 10*3/uL — AB (ref 1.7–7.7)
NEUTROS PCT: 78 %
PLATELETS: 184 10*3/uL (ref 150–400)
RBC: 5.04 MIL/uL (ref 3.87–5.11)
RDW: 14.1 % (ref 11.5–15.5)
WBC: 11.3 10*3/uL — AB (ref 4.0–10.5)

## 2017-05-26 LAB — URINALYSIS, ROUTINE W REFLEX MICROSCOPIC
BILIRUBIN URINE: NEGATIVE
Glucose, UA: NEGATIVE mg/dL
Ketones, ur: NEGATIVE mg/dL
Nitrite: NEGATIVE
PH: 7 (ref 5.0–8.0)
Protein, ur: NEGATIVE mg/dL
SPECIFIC GRAVITY, URINE: 1.003 — AB (ref 1.005–1.030)

## 2017-05-26 LAB — BASIC METABOLIC PANEL
ANION GAP: 9 (ref 5–15)
BUN: 21 mg/dL — ABNORMAL HIGH (ref 6–20)
CO2: 27 mmol/L (ref 22–32)
Calcium: 9.6 mg/dL (ref 8.9–10.3)
Chloride: 100 mmol/L — ABNORMAL LOW (ref 101–111)
Creatinine, Ser: 0.73 mg/dL (ref 0.44–1.00)
GFR calc Af Amer: >60 mL/min (ref >60)
Glucose, Bld: 105 mg/dL — ABNORMAL HIGH (ref 65–99)
POTASSIUM: 3.9 mmol/L (ref 3.5–5.1)
SODIUM: 136 mmol/L (ref 135–145)

## 2017-05-26 MED ORDER — OXYCODONE-ACETAMINOPHEN 5-325 MG PO TABS
1.0000 | ORAL_TABLET | Freq: Once | ORAL | Status: AC
  Administered 2017-05-26: 1 via ORAL
  Filled 2017-05-26: qty 1

## 2017-05-26 MED ORDER — METHOCARBAMOL 500 MG PO TABS: 500.0000 mg | ORAL_TABLET | Freq: Two times a day (BID) | ORAL | 0 refills | Status: DC | PRN

## 2017-05-26 NOTE — Discharge Instructions (Signed)
CONTINUE TO TAKE YOUR [place]. I AM GIVING YOU A MUSCLE RELAXER FOR YOUR BACK SPASMS.  FOLLOW UP WITH DR. Laurann Montana EARLY THIS WEEK.  SEEK IMMEDIATE MEDICAL ATTENTION IF: New numbness, tingling, weakness, or problem with the use of your arms or legs.  Severe back pain not relieved with medications.  Change in bowel or bladder control.  Increasing pain in any areas of the body (such as chest or abdominal pain).  Shortness of breath, dizziness or fainting.  Nausea (feeling sick to your stomach), vomiting, fever, or sweats.

## 2017-05-26 NOTE — ED Triage Notes (Signed)
Patient presents today from home with complaints of right flank pain. Patient denies any urinary changes, denies frequency, urgency, chest pain, SOB, N/V. Patient alert and oriented x4

## 2017-05-26 NOTE — ED Provider Notes (Signed)
[facility] DEPT Provider Note   CSN: [id number] Arrival date & time: 05/26/17  1636     History   Chief Complaint Chief Complaint  Patient presents with  . Flank Pain    HPI [name] is a 81 y.o. female who presents emergency Department with chief complaint of right lower back pain. Patient states that for the past several days she has had a catch in her lower back that is worse with ambulation and movement, improved at rest. She does have constant aching in her right flank area. She denies any recent falls or traumas but states that she has had multiple compression fractures previously due to her osteoporosis. She states that this does not feel the same. She denies any urinary symptoms, history of kidney stones, nausea, vomiting or fevers. Also denies any lower extremity weakness, loss of control of bowel or bladder, saddle anesthesia. She took a norco without relief. \ HPI  Past Medical History:  Diagnosis Date  . Anxiety   . Aortic regurgitation   . Breast cancer ([place]) 08/20/2011   R breast DCIS, ER/PR +  . Cataract 3 and 10/92   bilateral  . Chronic diastolic CHF (congestive heart failure) ([place])   . DVT (deep venous thrombosis) ([place]) 08/2011   LL extremity   . Fibromyalgia   . Fracture lumbar vertebra-closed ([place])   . Fracture of thoracic vertebra, closed ([place])   . GERD (gastroesophageal reflux disease)   . H/O hiatal hernia   . History of blood clots   . History of radiation therapy 01/2012   R breast  . Hypercholesteremia   . Hypertension    DR Orinda Kenner  . Hypothyroidism   . Mitral regurgitation   . Osteoporosis   . PAF (paroxysmal atrial fibrillation) ([place])    a. dx 11/2016.  . Rib fractures   . Thoracic ascending aortic aneurysm ([place])    a. followed by Dr. Prescott Gum.    Patient Active Problem List   Diagnosis Date Noted  . Ascending aortic aneurysm ([place]) 03/27/2017  . PAF (paroxysmal atrial fibrillation) ([place]) 12/21/2016  . Chronic diastolic  CHF (congestive heart failure) ([place]) 12/21/2016  . Mitral valve regurgitation   . Malnutrition of moderate degree 12/01/2016  . Hypokalemia   . Lobar pneumonia, unspecified organism ([place])   . Leukocytosis   . Herniated cervical intervertebral disc 09/09/2015  . Cervical radiculopathy   . Neoplasm of right breast, primary tumor staging category Tis: ductal carcinoma in situ (DCIS) 09/03/2013  . Dyspnea 01/21/2013  . Bilateral leg edema 01/21/2013  . History of DVT of lower extremity 01/21/2013  . Back pain, lumbosacral 01/21/2013  . Osteoporosis, unspecified 01/21/2013  . Essential hypertension 01/21/2013  . Hypothyroidism 01/21/2013  . History of blood clots   . Cataract   . DVT (deep venous thrombosis) ([place]) 09/07/2011    Past Surgical History:  Procedure Laterality Date  . ANTERIOR CERVICAL DECOMP/DISCECTOMY FUSION N/A 09/09/2015   Procedure: Anterior Cervical Decompression and Fusion Cervical seven-Thorasic one ;  Surgeon: Erline Levine, MD;  Location: [facility] NEURO ORS;  Service: Neurosurgery;  Laterality: N/A;  . APPENDECTOMY  1940  . BREAST SURGERY    . CATARACT EXTRACTION  1992  . EYE SURGERY  1940, 1956  . HERNIA REPAIR  10/16/2006   [facility] - Dr Hassell Done  . KYPHOPLASTY N/A 01/26/2013   Procedure: KYPHOPLASTY;  Surgeon: Kristeen Miss, MD;  Location: [facility] NEURO ORS;  Service: Neurosurgery;  Laterality: N/A;  T11 and L1  .  MASTECTOMY, PARTIAL  10/17/2011   Procedure: MASTECTOMY PARTIAL;  Surgeon: Haywood Lasso, MD;  Location: [facility];  Service: General;  Laterality: Right;  needle guided  . TEE WITHOUT CARDIOVERSION N/A 12/04/2016   Procedure: TRANSESOPHAGEAL ECHOCARDIOGRAM (TEE);  Surgeon: Pixie Casino, MD;  Location: [facility]COPY;  Service: Cardiovascular;  Laterality: N/A;  . TONSILLECTOMY  1944    OB History    No data available       Home Medications    Prior to Admission medications   Medication Sig Start Date End Date Taking? Authorizing Provider  apixaban (ELIQUIS)  5 MG TABS tablet Take 1 tablet (5 mg total) by mouth 2 (two) times daily. 03/27/17   Dorothy Spark, MD  BONIVA 150 MG tablet Take 150 mg by mouth every 30 (thirty) days.  08/30/11   [provider]  calcium citrate-vitamin D (CITRACAL+D) 315-200 MG-UNIT per tablet Take 1 tablet by mouth 2 (two) times daily. 01/28/13   [name], MD  cholecalciferol (VITAMIN D) 1000 UNITS tablet Take 1,000 Units by mouth daily. Vitamin D3    [provider]  diltiazem (CARDIZEM CD) 180 MG 24 hr capsule Take 1 capsule (180 mg total) by mouth daily. 03/27/17 06/25/17  Dorothy Spark, MD  flecainide (TAMBOCOR) 50 MG tablet Take 1 tablet (50 mg total) by mouth every 12 (twelve) hours. 03/27/17   Dunn, Nedra Hai, PA-C  furosemide (LASIX) 20 MG tablet Take 1 tablet (20 mg total) by mouth every other day. 03/27/17 06/25/17  Dorothy Spark, MD  HYDROcodone-acetaminophen (NORCO/VICODIN) 5-325 MG tablet Take 1-2 tablets by mouth every 4 (four) hours as needed (mild pain). 09/12/15   Reyne Dumas, MD  levothyroxine (SYNTHROID, LEVOTHROID) 125 MCG tablet Take 125 mcg by mouth daily before breakfast.    [provider]  Multiple Vitamin (MULTIVITAMIN) tablet Take 1 tablet by mouth daily.     [provider]  omeprazole (PRILOSEC OTC) 20 MG tablet Take 20 mg by mouth daily.    [provider]  pravastatin (PRAVACHOL) 20 MG tablet Take 20 mg by mouth every evening.  09/02/11   [provider]  solifenacin (VESICARE) 5 MG tablet Take 5 mg by mouth daily.    [provider]    Family History Family History  Problem Relation Age of Onset  . Heart disease Mother   . Heart disease Maternal Uncle   . Heart disease Maternal Grandfather     Social History Social History  Substance Use Topics  . Smoking status: Never Smoker  . Smokeless tobacco: Never Used  . Alcohol use No     Allergies   Contrast media [iodinated diagnostic agents]; Iohexol; and  Penicillins   Review of Systems Review of Systems  Ten systems reviewed and are negative for acute change, except as noted in the HPI.   Physical Exam Updated Vital Signs BP (!) 161/80   Pulse (!) 111   Temp 98.3 F (36.8 C) (Oral)   Resp 18   SpO2 97%   Physical Exam  Constitutional: She is oriented to person, place, and time. She appears well-developed and well-nourished. No distress.  HENT:  Head: Normocephalic and atraumatic.  Eyes: Conjunctivae are normal. No scleral icterus.  Neck: Normal range of motion.  Cardiovascular: Normal rate, regular rhythm and normal heart sounds.  Exam reveals no gallop and no friction rub.   No murmur heard. Pulmonary/Chest: Effort normal and breath sounds normal. No respiratory distress.  Abdominal: Soft. Bowel sounds  are normal. She exhibits no distension and no mass. There is no tenderness. There is no guarding.  Musculoskeletal:       Lumbar back: She exhibits decreased range of motion, tenderness, bony tenderness, swelling and deformity (due to osteoporosis). She exhibits no edema.       Back:  Neurological: She is alert and oriented to person, place, and time.  Skin: Skin is warm and dry. She is not diaphoretic.  Psychiatric: Her behavior is normal.  Nursing note and vitals reviewed.    ED Treatments / Results  Labs (all labs ordered are listed, but only abnormal results are displayed) Labs Reviewed  BASIC METABOLIC PANEL  CBC WITH DIFFERENTIAL/PLATELET  URINALYSIS, ROUTINE W REFLEX MICROSCOPIC    EKG  EKG Interpretation None       Radiology No results found.  Procedures Procedures (including critical care time)  Medications Ordered in ED Medications  oxyCODONE-acetaminophen (PERCOCET/ROXICET) 5-325 MG per tablet 1 tablet (1 tablet Oral Given 05/26/17 1728)     Initial Impression / Assessment and Plan / ED Course  I have reviewed the triage vital signs and the nursing notes.  Pertinent labs & imaging results  that were available during my care of the patient were reviewed by me and considered in my medical decision making (see chart for details).     Patient with what appears to be musculoskeletal back pain. She denies any symptoms of urinary infection. She does have a large amount of white blood cells, rare bacteria, we will discharge with Robaxin muscle relaxer. She is a chronic compression fracture which may be causing the spastic tissue in her right lower back. Patient seen in shared visit with attending physician. Who agrees with assessment, work up , treatment, and plan for discharge     Final Clinical Impressions(s) / ED Diagnoses   Final diagnoses:  None    New Prescriptions New Prescriptions   No medications on file     Margarita Mail, PA-C 05/29/17 1606    Lajean Saver, MD 05/29/17 [phone]

## 2017-05-30 ENCOUNTER — Emergency Department (HOSPITAL_COMMUNITY)
Admission: EM | Admit: 2017-05-30 | Discharge: 2017-05-30 | Disposition: A | Discharge status: Home / Self Care | Payer: Medicare Other | Attending: Emergency Medicine | Admitting: Emergency Medicine

## 2017-05-30 ENCOUNTER — Emergency Department (HOSPITAL_COMMUNITY): Payer: Medicare Other

## 2017-05-30 ENCOUNTER — Encounter (HOSPITAL_COMMUNITY): Payer: Self-pay

## 2017-05-30 DIAGNOSIS — I110 Hypertensive heart disease with heart failure: Secondary | ICD-10-CM | POA: Insufficient documentation

## 2017-05-30 DIAGNOSIS — Z853 Personal history of malignant neoplasm of breast: Secondary | ICD-10-CM | POA: Diagnosis not present

## 2017-05-30 DIAGNOSIS — Z880 Allergy status to penicillin: Secondary | ICD-10-CM | POA: Insufficient documentation

## 2017-05-30 DIAGNOSIS — Z79899 Other long term (current) drug therapy: Secondary | ICD-10-CM | POA: Diagnosis not present

## 2017-05-30 DIAGNOSIS — N390 Urinary tract infection, site not specified: Secondary | ICD-10-CM | POA: Diagnosis not present

## 2017-05-30 DIAGNOSIS — R109 Unspecified abdominal pain: Secondary | ICD-10-CM | POA: Insufficient documentation

## 2017-05-30 DIAGNOSIS — Z7901 Long term (current) use of anticoagulants: Secondary | ICD-10-CM | POA: Diagnosis not present

## 2017-05-30 DIAGNOSIS — M549 Dorsalgia, unspecified: Secondary | ICD-10-CM | POA: Diagnosis present

## 2017-05-30 DIAGNOSIS — Y999 Unspecified external cause status: Secondary | ICD-10-CM | POA: Diagnosis not present

## 2017-05-30 DIAGNOSIS — S32040A Wedge compression fracture of fourth lumbar vertebra, initial encounter for closed fracture: Secondary | ICD-10-CM | POA: Diagnosis not present

## 2017-05-30 DIAGNOSIS — Y929 Unspecified place or not applicable: Secondary | ICD-10-CM | POA: Insufficient documentation

## 2017-05-30 DIAGNOSIS — I5032 Chronic diastolic (congestive) heart failure: Secondary | ICD-10-CM | POA: Insufficient documentation

## 2017-05-30 DIAGNOSIS — Y939 Activity, unspecified: Secondary | ICD-10-CM | POA: Diagnosis not present

## 2017-05-30 DIAGNOSIS — E039 Hypothyroidism, unspecified: Secondary | ICD-10-CM | POA: Insufficient documentation

## 2017-05-30 DIAGNOSIS — Y33XXXA Other specified events, undetermined intent, initial encounter: Secondary | ICD-10-CM | POA: Diagnosis not present

## 2017-05-30 DIAGNOSIS — M5489 Other dorsalgia: Secondary | ICD-10-CM | POA: Diagnosis not present

## 2017-05-30 DIAGNOSIS — K449 Diaphragmatic hernia without obstruction or gangrene: Secondary | ICD-10-CM | POA: Diagnosis not present

## 2017-05-30 LAB — URINALYSIS, ROUTINE W REFLEX MICROSCOPIC
Bilirubin Urine: NEGATIVE
Glucose, UA: NEGATIVE mg/dL
Hgb urine dipstick: NEGATIVE
Ketones, ur: NEGATIVE mg/dL
Nitrite: NEGATIVE
PROTEIN: NEGATIVE mg/dL
SPECIFIC GRAVITY, URINE: 1.01 (ref 1.005–1.030)
pH: 7 (ref 5.0–8.0)

## 2017-05-30 MED ORDER — TRAMADOL HCL 50 MG PO TABS: 50.0000 mg | ORAL_TABLET | Freq: Four times a day (QID) | ORAL | 0 refills | Status: DC | PRN

## 2017-05-30 MED ORDER — DEXTROSE 5 % IV SOLN
1.0000 g | Freq: Once | INTRAVENOUS | Status: AC
  Administered 2017-05-30: 1 g via INTRAVENOUS
  Filled 2017-05-30: qty 10

## 2017-05-30 MED ORDER — CEPHALEXIN 500 MG PO CAPS: 500.0000 mg | ORAL_CAPSULE | Freq: Three times a day (TID) | ORAL | 0 refills | Status: DC

## 2017-05-30 MED ORDER — HYDROCODONE-ACETAMINOPHEN 5-325 MG PO TABS
2.0000 | ORAL_TABLET | Freq: Once | ORAL | Status: AC
  Administered 2017-05-30: 2 via ORAL
  Filled 2017-05-30: qty 2

## 2017-05-30 NOTE — ED Provider Notes (Signed)
[facility] DEPT Provider Note   CSN: [id number] Arrival date & time: 05/30/17  0915     History   Chief Complaint Chief Complaint  Patient presents with  . Back Pain    HPI [name] is a 81 y.o. female.  Patient with hx ddd, severe kyphosis, thoracic and lumbar compression fractures, c/o worsening low back pain in the past 1-2 weeks. Pain constant, dull, mod-severe, worse w position changes, movements, bending at waist, turning. No radiation to legs. No leg numbness/weakness. Pain is bilateral, lower back. Denies fever or chills. Has also notes some bil flank pain. No vomiting.  No fever or chills. Denies trauma or fall. Has pcp and neurosurgeon but has not yet seen since pain increased - has appt next week. Recent films in ED showed prior compression fxs.  Pt states could not take the muscle relaxer prescribed in ED or the other called in by her pcp due to making her feel funny/dizzy.    The history is provided by the patient.  Back Pain   Pertinent negatives include no chest pain, no fever, no headaches, no abdominal pain and no dysuria.    Past Medical History:  Diagnosis Date  . Anxiety   . Aortic regurgitation   . Breast cancer ([place]) 08/20/2011   R breast DCIS, ER/PR +  . Cataract 3 and 10/92   bilateral  . Chronic diastolic CHF (congestive heart failure) ([place])   . DVT (deep venous thrombosis) ([place]) 08/2011   LL extremity   . Fibromyalgia   . Fracture lumbar vertebra-closed ([place])   . Fracture of thoracic vertebra, closed ([place])   . GERD (gastroesophageal reflux disease)   . H/O hiatal hernia   . History of blood clots   . History of radiation therapy 01/2012   R breast  . Hypercholesteremia   . Hypertension    DR Orinda Kenner  . Hypothyroidism   . Mitral regurgitation   . Osteoporosis   . PAF (paroxysmal atrial fibrillation) ([place])    a. dx 11/2016.  . Rib fractures   . Thoracic ascending aortic aneurysm ([place])    a. followed by Dr. Prescott Gum.     Patient Active Problem List   Diagnosis Date Noted  . Ascending aortic aneurysm ([place]) 03/27/2017  . PAF (paroxysmal atrial fibrillation) ([place]) 12/21/2016  . Chronic diastolic CHF (congestive heart failure) ([place]) 12/21/2016  . Mitral valve regurgitation   . Malnutrition of moderate degree 12/01/2016  . Hypokalemia   . Lobar pneumonia, unspecified organism ([place])   . Leukocytosis   . Herniated cervical intervertebral disc 09/09/2015  . Cervical radiculopathy   . Neoplasm of right breast, primary tumor staging category Tis: ductal carcinoma in situ (DCIS) 09/03/2013  . Dyspnea 01/21/2013  . Bilateral leg edema 01/21/2013  . History of DVT of lower extremity 01/21/2013  . Back pain, lumbosacral 01/21/2013  . Osteoporosis, unspecified 01/21/2013  . Essential hypertension 01/21/2013  . Hypothyroidism 01/21/2013  . History of blood clots   . Cataract   . DVT (deep venous thrombosis) ([place]) 09/07/2011    Past Surgical History:  Procedure Laterality Date  . ANTERIOR CERVICAL DECOMP/DISCECTOMY FUSION N/A 09/09/2015   Procedure: Anterior Cervical Decompression and Fusion Cervical seven-Thorasic one ;  Surgeon: Erline Levine, MD;  Location: [facility];  Service: Neurosurgery;  Laterality: N/A;  . APPENDECTOMY  1940  . BREAST SURGERY    . CATARACT EXTRACTION  1992  . EYE SURGERY  1940, 1956  . HERNIA REPAIR  10/16/2006   [facility] - Dr Hassell Done  . KYPHOPLASTY N/A 01/26/2013   Procedure: KYPHOPLASTY;  Surgeon: Kristeen Miss, MD;  Location: [facility];  Service: Neurosurgery;  Laterality: N/A;  T11 and L1  . MASTECTOMY, PARTIAL  10/17/2011   Procedure: MASTECTOMY PARTIAL;  Surgeon: Haywood Lasso, MD;  Location: [facility];  Service: General;  Laterality: Right;  needle guided  . TEE WITHOUT CARDIOVERSION N/A 12/04/2016   Procedure: TRANSESOPHAGEAL ECHOCARDIOGRAM (TEE);  Surgeon: Pixie Casino, MD;  Location: [facility];  Service: Cardiovascular;  Laterality: N/A;  . TONSILLECTOMY  1944     OB History    No data available       Home Medications    Prior to Admission medications   Medication Sig Start Date End Date Taking? Authorizing Provider  apixaban (ELIQUIS) 5 MG TABS tablet Take 1 tablet (5 mg total) by mouth 2 (two) times daily. 03/27/17   Dorothy Spark, MD  BONIVA 150 MG tablet Take 150 mg by mouth every 30 (thirty) days.  08/30/11   [provider]  calcium citrate-vitamin D (CITRACAL+D) 315-200 MG-UNIT per tablet Take 1 tablet by mouth 2 (two) times daily. 01/28/13   [name], MD  cholecalciferol (VITAMIN D) 1000 UNITS tablet Take 1,000 Units by mouth daily. Vitamin D3    [provider]  diltiazem (CARDIZEM CD) 180 MG 24 hr capsule Take 1 capsule (180 mg total) by mouth daily. 03/27/17 06/25/17  Dorothy Spark, MD  flecainide (TAMBOCOR) 50 MG tablet Take 1 tablet (50 mg total) by mouth every 12 (twelve) hours. 03/27/17   Dunn, Nedra Hai, PA-C  furosemide (LASIX) 20 MG tablet Take 1 tablet (20 mg total) by mouth every other day. 03/27/17 06/25/17  Dorothy Spark, MD  HYDROcodone-acetaminophen (NORCO/VICODIN) 5-325 MG tablet Take 1-2 tablets by mouth every 4 (four) hours as needed (mild pain). 09/12/15   Reyne Dumas, MD  levothyroxine (SYNTHROID, LEVOTHROID) 125 MCG tablet Take 125 mcg by mouth daily before breakfast.    [provider]  methocarbamol (ROBAXIN) 500 MG tablet Take 1 tablet (500 mg total) by mouth 2 (two) times daily as needed for muscle spasms. 05/26/17   Margarita Mail, PA-C  Multiple Vitamin (MULTIVITAMIN) tablet Take 1 tablet by mouth daily.     [provider]  omeprazole (PRILOSEC OTC) 20 MG tablet Take 20 mg by mouth daily.    [provider]  pravastatin (PRAVACHOL) 20 MG tablet Take 20 mg by mouth every evening.  09/02/11   [provider]  solifenacin (VESICARE) 5 MG tablet Take 5 mg by mouth daily.    [provider]    Family History Family History  Problem  Relation Age of Onset  . Heart disease Mother   . Heart disease Maternal Uncle   . Heart disease Maternal Grandfather     Social History Social History  Substance Use Topics  . Smoking status: Never Smoker  . Smokeless tobacco: Never Used  . Alcohol use No     Allergies   Contrast media [iodinated diagnostic agents]; Iohexol; and Penicillins   Review of Systems Review of Systems  Constitutional: Negative for fever.  HENT: Negative for sore throat.   Eyes: Negative for visual disturbance.  Respiratory: Negative for shortness of breath.   Cardiovascular: Negative for chest pain.  Gastrointestinal: Negative for abdominal pain and vomiting.  Endocrine: Negative for polyuria.  Genitourinary: Positive for flank pain. Negative for dysuria.  Musculoskeletal: Positive for back pain. Negative  for neck pain.  Skin: Negative for rash.  Neurological: Negative for headaches.  Hematological: Does not bruise/bleed easily.  Psychiatric/Behavioral: Negative for confusion.     Physical Exam Updated Vital Signs BP 130/89 (BP Location: Left Arm)   Pulse 75   Temp (!) 97.4 F (36.3 C) (Oral)   Resp 18   Ht 1.499 m (4\' 11" )   Wt 49 kg (108 lb)   SpO2 98%   BMI 21.81 kg/m   Physical Exam  Constitutional: She appears well-developed and well-nourished. No distress.  HENT:  Head: Atraumatic.  Eyes: Conjunctivae are normal. No scleral icterus.  Neck: Neck supple. No tracheal deviation present.  Cardiovascular: Normal rate, normal heart sounds and intact distal pulses.   Pulmonary/Chest: Effort normal and breath sounds normal. No respiratory distress.  Abdominal: Soft. Normal appearance and bowel sounds are normal. She exhibits no distension. There is tenderness.  Mild bil abd/flank tenderness. No puls mass.  Genitourinary:  Genitourinary Comments: No cva tenderness  Musculoskeletal: She exhibits no edema.  Kyphosis/scoliosis. Lumbar muscular tenderness. Spine non tender.    Neurological: She is alert.  Skin: Skin is warm and dry. No rash noted. She is not diaphoretic.  No abnormal bruising noted.  No rash.   Psychiatric: She has a normal mood and affect.  Nursing note and vitals reviewed.    ED Treatments / Results  Labs (all labs ordered are listed, but only abnormal results are displayed) Results for orders placed or performed during the hospital encounter of 05/30/17  Urinalysis, Routine w reflex microscopic  Result Value Ref Range   Color, Urine YELLOW YELLOW   APPearance HAZY (A) CLEAR   Specific Gravity, Urine 1.010 1.005 - 1.030   pH 7.0 5.0 - 8.0   Glucose, UA NEGATIVE NEGATIVE mg/dL   Hgb urine dipstick NEGATIVE NEGATIVE   Bilirubin Urine NEGATIVE NEGATIVE   Ketones, ur NEGATIVE NEGATIVE mg/dL   Protein, ur NEGATIVE NEGATIVE mg/dL   Nitrite NEGATIVE NEGATIVE   Leukocytes, UA LARGE (A) NEGATIVE   RBC / HPF 0-5 0 - 5 RBC/hpf   WBC, UA TOO NUMEROUS TO COUNT 0 - 5 WBC/hpf   Bacteria, UA MANY (A) NONE SEEN   Squamous Epithelial / LPF 0-5 (A) NONE SEEN   Mucous PRESENT    Hyaline Casts, UA PRESENT    Ct Abdomen Pelvis Wo Contrast  Result Date: 05/30/2017 CLINICAL DATA:  Bilateral flank pain and low back pain. History of anticoagulation. EXAM: CT ABDOMEN AND PELVIS WITHOUT CONTRAST TECHNIQUE: Multidetector CT imaging of the abdomen and pelvis was performed following the standard protocol without IV contrast. COMPARISON:  Lumbar spine radiographs 05/26/2017. CT abdomen and pelvis 06/08/2011. Thoracic spine CT 09/04/2015. Pelvis MRI 05/09/2014. FINDINGS: Lower chest: A moderate to large sliding hiatal hernia is partially visualized, larger than on the prior CT. There is coronary artery atherosclerosis. Mild scarring and atelectasis are present in the lung bases. Hepatobiliary: No focal liver abnormality is seen. No gallstones, gallbladder wall thickening, or biliary dilatation. Pancreas: Mild diffuse atrophy.  No inflammation. Spleen: Unremarkable.  Adrenals/Urinary Tract: Suboptimal visualization of the adrenal glands. No evidence of renal mass, calculi, or hydronephrosis. Unremarkable bladder. Stomach/Bowel: Hiatal hernia as above. Moderate amount of stool throughout the colon. No evidence of bowel obstruction or inflammation. Vascular/Lymphatic: Abdominal aortic atherosclerosis without aneurysm. No enlarged lymph nodes. Reproductive: Interval placement of a surgical device around the vagina. No adnexal mass. Other: No intraperitoneal free fluid. Small fat containing left inguinal hernia. Musculoskeletal: 1.2 cm densely sclerotic lesion  in the posterior right ilium has enlarged from 2012 (previously 5 mm). There are chronic compression fractures from T11-L3, with prior augmentation noted at T11 and L1. There is also slight depression of the L4 superior endplate posteriorly which is new from 2015 and of indeterminate age. Thoracolumbar dextroscoliosis is noted. IMPRESSION: 1. No urinary tract calculi or retroperitoneal hemorrhage identified. 2. Chronic T11-L3 compression fractures. A mild L4 compression fracture is new from 2015 and of indeterminate acuity. 3. 1.2 cm sclerotic lesion in the right ilium, enlarged from 2012 and indeterminate though a blastic or treated metastasis is possible. 4. Moderate to large hiatal hernia. 5.  Aortic Atherosclerosis (ICD10-I70.0). Electronically Signed   By: Logan Bores M.D.   On: 05/30/2017 13:16   Dg Thoracic Spine 2 View  Result Date: 05/26/2017 CLINICAL DATA:  Right-sided back pain common no known injury, history of prior compression deformities EXAM: THORACIC SPINE 2 VIEWS COMPARISON:  04/05/2017 FINDINGS: There are changes of prior vertebral augmentation at T11 and L1. Chronic compression deformity of T12, T9 and T8 are again seen. Compression deformities of L2 and L3 are also noted. No new compression deformity is seen. Old rib fractures are noted bilaterally. Degenerative changes in the cervical spine are noted  as well. IMPRESSION: Chronic changes similar to that seen on recent chest x-ray. No acute abnormality noted. Electronically Signed   By: Inez Catalina M.D.   On: 05/26/2017 18:20   Dg Lumbar Spine Complete  Result Date: 05/26/2017 CLINICAL DATA:  Low back pain, no known injury, initial encounter EXAM: LUMBAR SPINE - COMPLETE 4+ VIEW COMPARISON:  09/04/2015. FINDINGS: There are changes consistent with prior vertebral augmentation at T11 and L1. Compression deformities have T12, L1 and L3 are noted similar to that seen on the prior CT from 2016. No new compression deformities are seen. No paraspinal mass lesion is noted. Bone island is noted within the right iliac bone. No other focal abnormality is seen. IMPRESSION: Chronic compression deformity stable from previous exams. No acute abnormality is noted. Electronically Signed   By: Inez Catalina M.D.   On: 05/26/2017 18:24    EKG  EKG Interpretation None       Radiology Ct Abdomen Pelvis Wo Contrast  Result Date: 05/30/2017 CLINICAL DATA:  Bilateral flank pain and low back pain. History of anticoagulation. EXAM: CT ABDOMEN AND PELVIS WITHOUT CONTRAST TECHNIQUE: Multidetector CT imaging of the abdomen and pelvis was performed following the standard protocol without IV contrast. COMPARISON:  Lumbar spine radiographs 05/26/2017. CT abdomen and pelvis 06/08/2011. Thoracic spine CT 09/04/2015. Pelvis MRI 05/09/2014. FINDINGS: Lower chest: A moderate to large sliding hiatal hernia is partially visualized, larger than on the prior CT. There is coronary artery atherosclerosis. Mild scarring and atelectasis are present in the lung bases. Hepatobiliary: No focal liver abnormality is seen. No gallstones, gallbladder wall thickening, or biliary dilatation. Pancreas: Mild diffuse atrophy.  No inflammation. Spleen: Unremarkable. Adrenals/Urinary Tract: Suboptimal visualization of the adrenal glands. No evidence of renal mass, calculi, or hydronephrosis. Unremarkable  bladder. Stomach/Bowel: Hiatal hernia as above. Moderate amount of stool throughout the colon. No evidence of bowel obstruction or inflammation. Vascular/Lymphatic: Abdominal aortic atherosclerosis without aneurysm. No enlarged lymph nodes. Reproductive: Interval placement of a surgical device around the vagina. No adnexal mass. Other: No intraperitoneal free fluid. Small fat containing left inguinal hernia. Musculoskeletal: 1.2 cm densely sclerotic lesion in the posterior right ilium has enlarged from 2012 (previously 5 mm). There are chronic compression fractures from T11-L3, with prior augmentation noted  at T11 and L1. There is also slight depression of the L4 superior endplate posteriorly which is new from 2015 and of indeterminate age. Thoracolumbar dextroscoliosis is noted. IMPRESSION: 1. No urinary tract calculi or retroperitoneal hemorrhage identified. 2. Chronic T11-L3 compression fractures. A mild L4 compression fracture is new from 2015 and of indeterminate acuity. 3. 1.2 cm sclerotic lesion in the right ilium, enlarged from 2012 and indeterminate though a blastic or treated metastasis is possible. 4. Moderate to large hiatal hernia. 5.  Aortic Atherosclerosis (ICD10-I70.0). Electronically Signed   By: Logan Bores M.D.   On: 05/30/2017 13:16    Procedures Procedures (including critical care time)  Medications Ordered in ED Medications  HYDROcodone-acetaminophen (NORCO/VICODIN) 5-325 MG per tablet 2 tablet (not administered)     Initial Impression / Assessment and Plan / ED Course  I have reviewed the triage vital signs and the nursing notes.  Pertinent labs & imaging results that were available during my care of the patient were reviewed by me and considered in my medical decision making (see chart for details).  Reviewed nursing notes and prior charts for additional history.   Pt last had pain meds at 0300.  Will give dose in ED.  Reviewed recent xray results.   Possible uti on  labs on recent check - will recheck ua today.  Given flank pain, anticoag use - will get ct.   Cath ua positive. Will cx and rx.  Rocephin iv.  Pain improved w meds.   Discussed findings noted on ct - pt will follow up with pcp.   Pt currently appears stable for d/c.     Final Clinical Impressions(s) / ED Diagnoses   Final diagnoses:  None    New Prescriptions New Prescriptions   No medications on file     Lajean Saver, MD 05/30/17 1326

## 2017-05-30 NOTE — ED Triage Notes (Signed)
Pt brought in by EMS due to having worsening chronic lower back pain. Pt was seen recently for the same and prescribed meds, but pt couldn't tolerate meds. Pt a&ox4.

## 2017-05-30 NOTE — Discharge Instructions (Signed)
It was our pleasure to provide your ER care today - we hope that you feel better.  The CT scan was read as showing: IMPRESSION: 1. No urinary tract calculi or retroperitoneal hemorrhage identified. 2. Chronic T11-L3 compression fractures. A mild L4 compression fracture is new from 2015 and of indeterminate acuity. 3. 1.2 cm sclerotic lesion in the right ilium, enlarged from 2012  Follow up with your doctor in the coming week for recheck as already arranged - discuss the above CT findings with him, and have him arrange appropriate follow up for you.   Also discuss additional options for pain control.   You may try taking ultram as need for pain - no driving when taking.   The lab tests show a urine infection - take antibiotic as prescribed, and follow up with your doctor.  Return to ER if worse, new symptoms, high fevers, weak/fainting, numbness/weakness, other concern.

## 2017-05-30 NOTE — ED Notes (Signed)
Removed peripheral IV, Left wrist; site clean, dry, intact

## 2017-05-31 ENCOUNTER — Telehealth: Payer: Self-pay | Admitting: Cardiology

## 2017-05-31 NOTE — Telephone Encounter (Signed)
Attempted to call the pt x 3 and phone is busy.

## 2017-05-31 NOTE — Telephone Encounter (Signed)
Follow up    Pt is calling asking for a call back. She has another question.

## 2017-05-31 NOTE — Telephone Encounter (Signed)
Called patient back about her question. Patient is worried about taking Tramadol. Patient does not want medication to cause any heart issues. Consulted with Georgina Peer, our pharmacist, and medications does not indicate any drug interactions with her cardiac medications. This medication can make the patient have postural hypotension. Informed patient of this. Informed patient to see how the medication effects her when she takes it for the first time, and if it does not work for her to call her PCP, who manages her pain medication. Encouraged patient not to take Norco and tramadol together. Patient verbalized understanding.

## 2017-05-31 NOTE — Telephone Encounter (Signed)
Patient is still concerned about taking Tramadol. She wants to get advise from Dr. Meda Coffee. Patient stated " If Dr. Meda Coffee was in her condition would she take Tramadol?" Will forward to Dr. Meda Coffee for advisement.

## 2017-05-31 NOTE — Telephone Encounter (Signed)
Spoke with the pt and informed her their are no contraindications with any of her cardiac meds that will interfere with her taking tramadol.  Advised the pt to call her PCP to have her Tramadol switched to a different pain regimen, for it seems that she feels uncomfortable taking this medication all together.  Pt verbalized understanding and agrees with this plan.

## 2017-05-31 NOTE — Telephone Encounter (Signed)
[name] is calling because she was in the ER on yesterday and have a compression fraction and the doctor said she should try Tramadol 50mg  tablet and she is wanting to know should she take this with her having AFib and heart failure . Please call

## 2017-06-01 LAB — URINE CULTURE: Culture: >=100000 — AB

## 2017-06-02 ENCOUNTER — Telehealth: Payer: Self-pay

## 2017-06-02 NOTE — Progress Notes (Signed)
ED Antimicrobial Stewardship Positive Culture Follow Up   [name] is an 81 y.o. female who presented to [facility] on 05/30/2017 with a chief complaint of  Chief Complaint  Patient presents with  . Back Pain    Recent Results (from the past 720 hour(s))  Urine Culture     Status: Abnormal   Collection Time: 05/30/17 11:52 AM  Result Value Ref Range Status   Specimen Description URINE, CLEAN CATCH  Final   Special Requests NONE  Final   Culture (A)  Final    >=100,000 COLONIES/mL ESCHERICHIA COLI Confirmed Extended Spectrum Beta-Lactamase Producer (ESBL)    Report Status 06/01/2017 FINAL  Final   Organism ID, Bacteria ESCHERICHIA COLI (A)  Final      Susceptibility   Escherichia coli - MIC*    AMPICILLIN >=32 RESISTANT Resistant     CEFAZOLIN >=64 RESISTANT Resistant     CEFTRIAXONE >=64 RESISTANT Resistant     CIPROFLOXACIN >=4 RESISTANT Resistant     GENTAMICIN <=1 SENSITIVE Sensitive     IMIPENEM <=0.25 SENSITIVE Sensitive     NITROFURANTOIN 64 INTERMEDIATE Intermediate     TRIMETH/SULFA >=320 RESISTANT Resistant     AMPICILLIN/SULBACTAM 16 INTERMEDIATE Intermediate     PIP/TAZO <=4 SENSITIVE Sensitive     Extended ESBL POSITIVE Resistant     * >=100,000 COLONIES/mL ESCHERICHIA COLI   Will call patient for symptom check  If some improvement without flank pain or fevers - fosfomycin 3 g x 1  If no improvement or symptoms of infection such as fever, patient will need to return to ED for IV abx  ED Provider: Jeannett Senior, PA-C  Wynell Balloon 06/02/2017, 11:07 AM Infectious Diseases Pharmacist Phone# [phone]

## 2017-06-02 NOTE — Telephone Encounter (Signed)
Post ED Visit - Positive Culture Follow-up: Successful Patient Follow-Up  Culture assessed and recommendations reviewed by: []  Elenor Quinones, Pharm.D. []  Heide Guile, Pharm.D., BCPS AQ-ID [x]  Parks Neptune, Pharm.D., BCPS []  Alycia Rossetti, Pharm.D., BCPS []  [place], Pharm.D., BCPS, AAHIVP []  Legrand Como, Pharm.D., BCPS, AAHIVP []  Salome Arnt, PharmD, BCPS []  Dimitri Ped, PharmD, BCPS []  Vincenza Hews, PharmD, BCPS  Positive urine culture  []  Patient discharged without antimicrobial prescription and treatment is now indicated [x]  Organism is resistant to prescribed ED discharge antimicrobial []  Patient with positive blood cultures  Changes discussed with ED provider: Jeannett Senior PA-C New antibiotic prescription Fosfomycin 3 gm x 1 dose Called to [place]  Contacted patient, date 05/13/17, time 1316   Kristianna Saperstein, [place] Comment 06/02/2017, 1:14 PM

## 2017-06-03 DIAGNOSIS — M4120 Other idiopathic scoliosis, site unspecified: Secondary | ICD-10-CM | POA: Diagnosis not present

## 2017-06-03 DIAGNOSIS — M545 Low back pain: Secondary | ICD-10-CM | POA: Diagnosis not present

## 2017-06-03 DIAGNOSIS — I10 Essential (primary) hypertension: Secondary | ICD-10-CM | POA: Diagnosis not present

## 2017-06-03 DIAGNOSIS — S22000G Wedge compression fracture of unspecified thoracic vertebra, subsequent encounter for fracture with delayed healing: Secondary | ICD-10-CM | POA: Diagnosis not present

## 2017-06-05 ENCOUNTER — Other Ambulatory Visit: Payer: Self-pay | Admitting: Neurosurgery

## 2017-06-05 DIAGNOSIS — M545 Low back pain: Secondary | ICD-10-CM

## 2017-06-05 DIAGNOSIS — R52 Pain, unspecified: Secondary | ICD-10-CM

## 2017-06-06 ENCOUNTER — Other Ambulatory Visit: Payer: Self-pay | Admitting: Neurosurgery

## 2017-06-06 DIAGNOSIS — S22000G Wedge compression fracture of unspecified thoracic vertebra, subsequent encounter for fracture with delayed healing: Secondary | ICD-10-CM

## 2017-06-11 DIAGNOSIS — R8290 Unspecified abnormal findings in urine: Secondary | ICD-10-CM | POA: Diagnosis not present

## 2017-06-11 DIAGNOSIS — S32040A Wedge compression fracture of fourth lumbar vertebra, initial encounter for closed fracture: Secondary | ICD-10-CM | POA: Diagnosis not present

## 2017-06-16 ENCOUNTER — Ambulatory Visit
Admission: RE | Admit: 2017-06-16 | Discharge: 2017-06-16 | Disposition: A | Discharge status: Home / Self Care | Payer: Medicare Other | Source: Ambulatory Visit | Attending: Neurosurgery | Admitting: Neurosurgery

## 2017-06-16 DIAGNOSIS — S22000G Wedge compression fracture of unspecified thoracic vertebra, subsequent encounter for fracture with delayed healing: Secondary | ICD-10-CM

## 2017-06-16 DIAGNOSIS — S32040G Wedge compression fracture of fourth lumbar vertebra, subsequent encounter for fracture with delayed healing: Secondary | ICD-10-CM | POA: Diagnosis not present

## 2017-06-16 DIAGNOSIS — S22080G Wedge compression fracture of T11-T12 vertebra, subsequent encounter for fracture with delayed healing: Secondary | ICD-10-CM | POA: Diagnosis not present

## 2017-06-18 DIAGNOSIS — I10 Essential (primary) hypertension: Secondary | ICD-10-CM | POA: Diagnosis not present

## 2017-06-18 DIAGNOSIS — M545 Low back pain: Secondary | ICD-10-CM | POA: Diagnosis not present

## 2017-06-18 DIAGNOSIS — M4120 Other idiopathic scoliosis, site unspecified: Secondary | ICD-10-CM | POA: Diagnosis not present

## 2017-06-18 DIAGNOSIS — S32040A Wedge compression fracture of fourth lumbar vertebra, initial encounter for closed fracture: Secondary | ICD-10-CM | POA: Diagnosis not present

## 2017-06-19 ENCOUNTER — Other Ambulatory Visit: Payer: Self-pay | Admitting: Neurosurgery

## 2017-06-20 ENCOUNTER — Encounter (HOSPITAL_COMMUNITY): Payer: Self-pay | Admitting: *Deleted

## 2017-06-20 MED ORDER — VANCOMYCIN HCL IN DEXTROSE 1-5 GM/200ML-% IV SOLN
1000.0000 mg | INTRAVENOUS | Status: AC
  Administered 2017-06-21: 1000 mg via INTRAVENOUS
  Filled 2017-06-20: qty 200

## 2017-06-20 NOTE — Progress Notes (Signed)
Anesthesia Chart Review:  Pt is a same day work up  Pt is a 81 year old female scheduled for L4 kyphoplasty on 06/21/2017 with Erline Levine, MD  - PCP is [name], MD - Cardiologist is Ena Dawley, MD, last office visit 03/27/17 - CT surgeon is Ivin Poot, MD, last office visit 04/05/17  PMH includes:  CHF, thoracic ascending aneurysm (stable, 4.7cm by 12/06/16 CT), PAF, moderate mitral regurgitation with severe pulmonary hypertension, HTN, hyperlipidemia, DVT (2012), hypothyroidism, breast cancer, GERD. Never smoker. S/p ACDF 09/09/15. S/p thoracic kyphoplasty 01/26/13. S/p partial mastectomy 10/17/11  - ED visit 05/30/17 for back pain, dx UTI  - Hospitalized 1/16-28/18 for acute respiratory failure with lobar pneumonia, new onset afib with RVR (spontaneously converted to NSR), acute on chronic CHF.  Moderate to severe mitral regurgitation identified on echo.    Medications include: eliquis, diltiazem, flecainide, lasix, levothyroxine, prilosec, pravastatin  Labs will be obtained DOS.   CXR 04/05/17:  1. No evidence of active disease. 2. Moderate hiatal hernia. 3. Cardiomegaly.  EKG 12/26/16: NSR. Possible LAE.  LAFB.  Anterior infarct, age undetermined.   Nuclear stress test 01/29/17:   The nuclear study is very poor quality due to body habitus  Defect 1: There is a defect present in the basal inferolateral and mid inferolateral location that is most likely artifact due to uptake in structures below the diaphragm.  This is a low risk study.  Nuclear stress EF: 72%. The left ventricular ejection fraction is hyperdynamic (>65%).   CT chest 12/06/16:  1. Moderate emphysema. 2. Scattered areas of bronchiectasis and interstitial scarring/volume loss. 3. Small moderate right pleural effusion. 4. 4.7 cm ascending thoracic aortic aneurysm.   TEE 12/04/16:  - Left ventricle: There was mild concentric hypertrophy. Systolic function was normal. The estimated ejection fraction was  in the range of 60% to 65%. Wall motion was normal; there were no regional wall motion abnormalities. - Aortic valve: Sclerosis with mild AI. - Aorta: Dilated. Ascending aortic diameter: 45 mm (S). - Mitral valve: Partially flail P2 segment with moderate, posteriorly directed regurgitation. Effective regurgitant orifice (PISA): 0.33 cm^2. Regurgitant volume (PISA): 40 ml. - Left atrium: Moderately dilated. No evidence of thrombus in the atrial cavity or appendage. - Right atrium: No evidence of thrombus in the atrial cavity or appendage. - Atrial septum: No defect or patent foramen ovale was identified. - Pulmonic valve: No evidence of vegetation. 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 LVEF 60-65%, moderate LAE, no LAA thrombus.  Echo 11/28/16:  - Left ventricle: The cavity size was normal. There was mild concentric hypertrophy. Systolic function was normal. The estimated ejection fraction was in the range of 55% to 60%. Wall motion was normal; there were no regional wall motion abnormalities. The study was not technically sufficient to allow evaluation of LV diastolic dysfunction due to atrial fibrillation. - Aortic valve: There was mild to moderate regurgitation. - Mitral valve: There is thickening of both leaflets with bileaflet prolapse posterior > anterior. There is centrally directed jet of at least moderate mitral regurgitation. - Left atrium: The atrium was severely dilated. - Right atrium: The atrium was moderately dilated. - Tricuspid valve: There was severe regurgitation. - Pulmonary arteries: Systolic pressure was severely increased. PA peak pressure: 67 mm Hg (S). - Inferior vena cava: The vessel was dilated. The respirophasic diameter changes were blunted (< 50%), consistent with elevated central venous  pressure.  Reviewed case with Dr. Lissa Hoard.  If labs acceptable DOS, I  anticipate pt can proceed as scheduled.   Willeen Cass, FNP-BC [facility]/Anesthesiology Phone: [phone] 06/20/2017 1:04 PM

## 2017-06-20 NOTE — Progress Notes (Signed)
Pt denies SOB and chest pain. Pt under the care of Dr. Ena Dawley, Cardiology. Pt denies having a cardiac cath. Pt stated that her last dose of Eliquis was Monday morning. Pt made aware to stop taking Sudafed (last dose this morning), Aspirin, vitamins, fish oil and herbal medications. Do not take any NSAIDs ie: Ibuprofen, Advil, Naproxen (Aleve), Motrin, BC and Goody Powder or any medication containing Aspirin. Dr. Rodman Comp, Anesthesia, stated that pt cannot have solid foods after midnight toinght however, pt can have clear liquids ( coffee no cream/milk). tomorrow up until 12:00 noon pending a 5:00pm surgery time. Pt made aware of the the following diet recommendations  . Pt advised advised to call SS the morning of procedure -to make sure that her surgery time has not changed, before consuming liquids  (to avoid surgery cancellation). Pt verbalized understanding of all pre-op instructions. Anesthesia asked to review pt history ( see note).

## 2017-06-21 ENCOUNTER — Ambulatory Visit (HOSPITAL_COMMUNITY): Payer: Medicare Other | Admitting: Emergency Medicine

## 2017-06-21 ENCOUNTER — Encounter (HOSPITAL_COMMUNITY)
Admission: RE | Disposition: A | Discharge status: Home / Self Care | Payer: Self-pay | Source: Ambulatory Visit | Attending: Neurosurgery

## 2017-06-21 ENCOUNTER — Encounter (HOSPITAL_COMMUNITY): Payer: Self-pay | Admitting: Surgery

## 2017-06-21 ENCOUNTER — Observation Stay (HOSPITAL_COMMUNITY)
Admission: RE | Admit: 2017-06-21 | Discharge: 2017-06-22 | Disposition: A | Discharge status: Home / Self Care | Payer: Medicare Other | Source: Ambulatory Visit | Attending: Neurosurgery | Admitting: Neurosurgery

## 2017-06-21 ENCOUNTER — Observation Stay (HOSPITAL_COMMUNITY): Payer: Medicare Other

## 2017-06-21 DIAGNOSIS — S32000A Wedge compression fracture of unspecified lumbar vertebra, initial encounter for closed fracture: Secondary | ICD-10-CM | POA: Diagnosis present

## 2017-06-21 DIAGNOSIS — S32040A Wedge compression fracture of fourth lumbar vertebra, initial encounter for closed fracture: Secondary | ICD-10-CM | POA: Diagnosis not present

## 2017-06-21 DIAGNOSIS — Z79899 Other long term (current) drug therapy: Secondary | ICD-10-CM | POA: Insufficient documentation

## 2017-06-21 DIAGNOSIS — I340 Nonrheumatic mitral (valve) insufficiency: Secondary | ICD-10-CM | POA: Diagnosis not present

## 2017-06-21 DIAGNOSIS — Z7901 Long term (current) use of anticoagulants: Secondary | ICD-10-CM | POA: Insufficient documentation

## 2017-06-21 DIAGNOSIS — M545 Low back pain: Secondary | ICD-10-CM | POA: Insufficient documentation

## 2017-06-21 DIAGNOSIS — Z419 Encounter for procedure for purposes other than remedying health state, unspecified: Secondary | ICD-10-CM

## 2017-06-21 DIAGNOSIS — I4891 Unspecified atrial fibrillation: Secondary | ICD-10-CM | POA: Insufficient documentation

## 2017-06-21 DIAGNOSIS — M4856XA Collapsed vertebra, not elsewhere classified, lumbar region, initial encounter for fracture: Principal | ICD-10-CM | POA: Insufficient documentation

## 2017-06-21 DIAGNOSIS — I82409 Acute embolism and thrombosis of unspecified deep veins of unspecified lower extremity: Secondary | ICD-10-CM | POA: Diagnosis not present

## 2017-06-21 DIAGNOSIS — M419 Scoliosis, unspecified: Secondary | ICD-10-CM | POA: Diagnosis not present

## 2017-06-21 DIAGNOSIS — M4120 Other idiopathic scoliosis, site unspecified: Secondary | ICD-10-CM | POA: Diagnosis not present

## 2017-06-21 DIAGNOSIS — M5126 Other intervertebral disc displacement, lumbar region: Secondary | ICD-10-CM | POA: Diagnosis not present

## 2017-06-21 DIAGNOSIS — I480 Paroxysmal atrial fibrillation: Secondary | ICD-10-CM | POA: Diagnosis not present

## 2017-06-21 DIAGNOSIS — I10 Essential (primary) hypertension: Secondary | ICD-10-CM | POA: Insufficient documentation

## 2017-06-21 DIAGNOSIS — S32040G Wedge compression fracture of fourth lumbar vertebra, subsequent encounter for fracture with delayed healing: Secondary | ICD-10-CM

## 2017-06-21 HISTORY — DX: Wedge compression fracture of fourth lumbar vertebra, initial encounter for closed fracture: S32.040A

## 2017-06-21 HISTORY — PX: KYPHOPLASTY: SHX5884

## 2017-06-21 LAB — BASIC METABOLIC PANEL
Anion gap: 9 (ref 5–15)
BUN: 19 mg/dL (ref 6–20)
CHLORIDE: 102 mmol/L (ref 101–111)
CO2: 29 mmol/L (ref 22–32)
CREATININE: 0.74 mg/dL (ref 0.44–1.00)
Calcium: 9.4 mg/dL (ref 8.9–10.3)
GFR calc non Af Amer: >60 mL/min (ref >60)
Glucose, Bld: 107 mg/dL — ABNORMAL HIGH (ref 65–99)
POTASSIUM: 3.9 mmol/L (ref 3.5–5.1)
SODIUM: 140 mmol/L (ref 135–145)

## 2017-06-21 LAB — CBC
HCT: 41.6 % (ref 36.0–46.0)
Hemoglobin: 14.2 g/dL (ref 12.0–15.0)
MCH: 28.5 pg (ref 26.0–34.0)
MCHC: 34.1 g/dL (ref 30.0–36.0)
MCV: 83.4 fL (ref 78.0–100.0)
PLATELETS: 192 10*3/uL (ref 150–400)
RBC: 4.99 MIL/uL (ref 3.87–5.11)
RDW: 13.6 % (ref 11.5–15.5)
WBC: 7.4 10*3/uL (ref 4.0–10.5)

## 2017-06-21 LAB — PROTIME-INR
INR: 1.09
Prothrombin Time: 14.2 seconds (ref 11.4–15.2)

## 2017-06-21 SURGERY — KYPHOPLASTY
Anesthesia: General | Site: Back

## 2017-06-21 MED ORDER — KCL IN DEXTROSE-NACL 20-5-0.45 MEQ/L-%-% IV SOLN: INTRAVENOUS | Status: DC

## 2017-06-21 MED ORDER — IOPAMIDOL (ISOVUE-300) INJECTION 61%
INTRAVENOUS | Status: DC | PRN
  Administered 2017-06-21: 50 mL

## 2017-06-21 MED ORDER — FENTANYL CITRATE (PF) 100 MCG/2ML IJ SOLN: 25.0000 ug | INTRAMUSCULAR | Status: DC | PRN

## 2017-06-21 MED ORDER — HYDROCODONE-ACETAMINOPHEN 5-325 MG PO TABS
1.0000 | ORAL_TABLET | ORAL | Status: DC | PRN
  Administered 2017-06-21 – 2017-06-22 (×2): 1 via ORAL

## 2017-06-21 MED ORDER — FENTANYL CITRATE (PF) 250 MCG/5ML IJ SOLN
INTRAMUSCULAR | Status: DC | PRN
  Administered 2017-06-21: 100 ug via INTRAVENOUS

## 2017-06-21 MED ORDER — MEPERIDINE HCL 25 MG/ML IJ SOLN: 6.2500 mg | INTRAMUSCULAR | Status: DC | PRN

## 2017-06-21 MED ORDER — FUROSEMIDE 20 MG PO TABS: 20.0000 mg | ORAL_TABLET | ORAL | Status: DC

## 2017-06-21 MED ORDER — PHENYLEPHRINE 40 MCG/ML (10ML) SYRINGE FOR IV PUSH (FOR BLOOD PRESSURE SUPPORT)
PREFILLED_SYRINGE | INTRAVENOUS | Status: DC | PRN
Start: 2017-06-21 — End: 2017-06-21
  Administered 2017-06-21: 40 ug via INTRAVENOUS
  Administered 2017-06-21: 80 ug via INTRAVENOUS

## 2017-06-21 MED ORDER — DILTIAZEM HCL ER COATED BEADS 180 MG PO CP24
180.0000 mg | ORAL_CAPSULE | Freq: Every day | ORAL | Status: DC
  Administered 2017-06-22: 180 mg via ORAL
  Filled 2017-06-21: qty 1

## 2017-06-21 MED ORDER — SODIUM CHLORIDE 0.9% FLUSH: 3.0000 mL | INTRAVENOUS | Status: DC | PRN

## 2017-06-21 MED ORDER — IBANDRONATE SODIUM 150 MG PO TABS: 150.0000 mg | ORAL_TABLET | ORAL | Status: DC

## 2017-06-21 MED ORDER — PANTOPRAZOLE SODIUM 40 MG IV SOLR
40.0000 mg | Freq: Every day | INTRAVENOUS | Status: DC
  Administered 2017-06-21: 40 mg via INTRAVENOUS
  Filled 2017-06-21: qty 40

## 2017-06-21 MED ORDER — 0.9 % SODIUM CHLORIDE (POUR BTL) OPTIME
TOPICAL | Status: DC | PRN
  Administered 2017-06-21: 1000 mL

## 2017-06-21 MED ORDER — SUGAMMADEX SODIUM 200 MG/2ML IV SOLN
INTRAVENOUS | Status: DC | PRN
  Administered 2017-06-21: 200 mg via INTRAVENOUS

## 2017-06-21 MED ORDER — LIDOCAINE 2% (20 MG/ML) 5 ML SYRINGE
INTRAMUSCULAR | Status: AC
  Filled 2017-06-21: qty 5

## 2017-06-21 MED ORDER — EPHEDRINE SULFATE-NACL 50-0.9 MG/10ML-% IV SOSY
PREFILLED_SYRINGE | INTRAVENOUS | Status: DC | PRN
  Administered 2017-06-21: 5 mg via INTRAVENOUS
  Administered 2017-06-21: 10 mg via INTRAVENOUS

## 2017-06-21 MED ORDER — VICKS VAPORUB 4.73-1.2-2.6 % EX OINT: TOPICAL_OINTMENT | Freq: Every day | CUTANEOUS | Status: DC | PRN

## 2017-06-21 MED ORDER — ONDANSETRON HCL 4 MG/2ML IJ SOLN
INTRAMUSCULAR | Status: DC | PRN
  Administered 2017-06-21: 4 mg via INTRAVENOUS

## 2017-06-21 MED ORDER — ZOLPIDEM TARTRATE 5 MG PO TABS: 5.0000 mg | ORAL_TABLET | Freq: Every evening | ORAL | Status: DC | PRN

## 2017-06-21 MED ORDER — ALUM & MAG HYDROXIDE-SIMETH 200-200-20 MG/5ML PO SUSP: 30.0000 mL | Freq: Four times a day (QID) | ORAL | Status: DC | PRN

## 2017-06-21 MED ORDER — BISACODYL 10 MG RE SUPP: 10.0000 mg | Freq: Every day | RECTAL | Status: DC | PRN

## 2017-06-21 MED ORDER — ADULT MULTIVITAMIN W/MINERALS CH: 1.0000 | ORAL_TABLET | Freq: Every day | ORAL | Status: DC

## 2017-06-21 MED ORDER — BUPIVACAINE HCL (PF) 0.5 % IJ SOLN
INTRAMUSCULAR | Status: DC | PRN
  Administered 2017-06-21: 2.5 mL

## 2017-06-21 MED ORDER — LEVOTHYROXINE SODIUM 125 MCG PO TABS
125.0000 ug | ORAL_TABLET | Freq: Every day | ORAL | Status: DC
  Administered 2017-06-22: 125 ug via ORAL
  Filled 2017-06-21: qty 1

## 2017-06-21 MED ORDER — ACETAMINOPHEN 650 MG RE SUPP: 650.0000 mg | RECTAL | Status: DC | PRN

## 2017-06-21 MED ORDER — ONDANSETRON HCL 4 MG/2ML IJ SOLN: 4.0000 mg | Freq: Once | INTRAMUSCULAR | Status: DC | PRN

## 2017-06-21 MED ORDER — BUPIVACAINE HCL (PF) 0.5 % IJ SOLN
INTRAMUSCULAR | Status: AC
  Filled 2017-06-21: qty 30

## 2017-06-21 MED ORDER — LACTATED RINGERS IV SOLN
INTRAVENOUS | Status: DC
  Administered 2017-06-21: 16:00:00 via INTRAVENOUS

## 2017-06-21 MED ORDER — ONDANSETRON HCL 4 MG/2ML IJ SOLN
4.0000 mg | Freq: Four times a day (QID) | INTRAMUSCULAR | Status: DC | PRN
  Filled 2017-06-21: qty 2

## 2017-06-21 MED ORDER — VITAMIN D 1000 UNITS PO TABS: 1000.0000 [IU] | ORAL_TABLET | Freq: Every day | ORAL | Status: DC

## 2017-06-21 MED ORDER — EPHEDRINE 5 MG/ML INJ
INTRAVENOUS | Status: AC
  Filled 2017-06-21: qty 10

## 2017-06-21 MED ORDER — SODIUM CHLORIDE 0.9% FLUSH: 3.0000 mL | Freq: Two times a day (BID) | INTRAVENOUS | Status: DC

## 2017-06-21 MED ORDER — PSEUDOEPHEDRINE HCL 60 MG PO TABS: 60.0000 mg | ORAL_TABLET | ORAL | Status: DC | PRN

## 2017-06-21 MED ORDER — CALCIUM CITRATE 950 (200 CA) MG PO TABS
1.0000 | ORAL_TABLET | Freq: Two times a day (BID) | ORAL | Status: DC
  Administered 2017-06-22: 200 mg via ORAL
  Filled 2017-06-21 (×2): qty 1

## 2017-06-21 MED ORDER — LIDOCAINE 2% (20 MG/ML) 5 ML SYRINGE
INTRAMUSCULAR | Status: DC | PRN
  Administered 2017-06-21: 100 mg via INTRAVENOUS

## 2017-06-21 MED ORDER — ROCURONIUM BROMIDE 10 MG/ML (PF) SYRINGE
PREFILLED_SYRINGE | INTRAVENOUS | Status: AC
  Filled 2017-06-21: qty 5

## 2017-06-21 MED ORDER — ESTROGENS, CONJUGATED 0.625 MG/GM VA CREA
1.0000 | TOPICAL_CREAM | VAGINAL | Status: DC
  Filled 2017-06-21: qty 30

## 2017-06-21 MED ORDER — HYDROMORPHONE HCL 1 MG/ML IJ SOLN
INTRAMUSCULAR | Status: AC
  Administered 2017-06-21: 0.25 mg via INTRAVENOUS
  Filled 2017-06-21: qty 1

## 2017-06-21 MED ORDER — DOCUSATE SODIUM 100 MG PO CAPS
100.0000 mg | ORAL_CAPSULE | Freq: Two times a day (BID) | ORAL | Status: DC
  Administered 2017-06-21: 100 mg via ORAL
  Filled 2017-06-21: qty 1

## 2017-06-21 MED ORDER — FENTANYL CITRATE (PF) 250 MCG/5ML IJ SOLN
INTRAMUSCULAR | Status: AC
  Filled 2017-06-21: qty 5

## 2017-06-21 MED ORDER — IOPAMIDOL (ISOVUE-300) INJECTION 61%
INTRAVENOUS | Status: AC
  Filled 2017-06-21: qty 50

## 2017-06-21 MED ORDER — PRAVASTATIN SODIUM 40 MG PO TABS
20.0000 mg | ORAL_TABLET | Freq: Every day | ORAL | Status: DC
  Administered 2017-06-21: 20 mg via ORAL
  Filled 2017-06-21: qty 1

## 2017-06-21 MED ORDER — MENTHOL 3 MG MT LOZG
1.0000 | LOZENGE | OROMUCOSAL | Status: DC | PRN
  Filled 2017-06-21: qty 9

## 2017-06-21 MED ORDER — CHLORHEXIDINE GLUCONATE CLOTH 2 % EX PADS: 6.0000 | MEDICATED_PAD | Freq: Once | CUTANEOUS | Status: DC

## 2017-06-21 MED ORDER — MORPHINE SULFATE (PF) 4 MG/ML IV SOLN: 1.0000 mg | INTRAVENOUS | Status: DC | PRN

## 2017-06-21 MED ORDER — FLECAINIDE ACETATE 50 MG PO TABS
50.0000 mg | ORAL_TABLET | Freq: Two times a day (BID) | ORAL | Status: DC
  Administered 2017-06-21 – 2017-06-22 (×2): 50 mg via ORAL
  Filled 2017-06-21 (×3): qty 1

## 2017-06-21 MED ORDER — SODIUM CHLORIDE 0.9 % IV SOLN: 250.0000 mL | INTRAVENOUS | Status: DC

## 2017-06-21 MED ORDER — HYDROMORPHONE HCL 1 MG/ML IJ SOLN
0.2500 mg | INTRAMUSCULAR | Status: DC | PRN
  Administered 2017-06-21 (×4): 0.25 mg via INTRAVENOUS

## 2017-06-21 MED ORDER — PROPOFOL 10 MG/ML IV BOLUS
INTRAVENOUS | Status: DC | PRN
  Administered 2017-06-21: 70 mg via INTRAVENOUS

## 2017-06-21 MED ORDER — DARIFENACIN HYDROBROMIDE ER 7.5 MG PO TB24
7.5000 mg | ORAL_TABLET | Freq: Every day | ORAL | Status: DC
  Filled 2017-06-21 (×2): qty 1

## 2017-06-21 MED ORDER — HYDROCODONE-ACETAMINOPHEN 5-325 MG PO TABS
1.0000 | ORAL_TABLET | ORAL | Status: DC | PRN
  Administered 2017-06-22 (×2): 1 via ORAL
  Filled 2017-06-21 (×5): qty 1

## 2017-06-21 MED ORDER — PHENOL 1.4 % MT LIQD: 1.0000 | OROMUCOSAL | Status: DC | PRN

## 2017-06-21 MED ORDER — LIDOCAINE-EPINEPHRINE 1 %-1:100000 IJ SOLN
INTRAMUSCULAR | Status: AC
  Filled 2017-06-21: qty 1

## 2017-06-21 MED ORDER — ACETAMINOPHEN 325 MG PO TABS: 650.0000 mg | ORAL_TABLET | ORAL | Status: DC | PRN

## 2017-06-21 MED ORDER — SENNOSIDES-DOCUSATE SODIUM 8.6-50 MG PO TABS: 1.0000 | ORAL_TABLET | Freq: Every evening | ORAL | Status: DC | PRN

## 2017-06-21 MED ORDER — ONDANSETRON HCL 4 MG PO TABS: 4.0000 mg | ORAL_TABLET | Freq: Four times a day (QID) | ORAL | Status: DC | PRN

## 2017-06-21 MED ORDER — VANCOMYCIN HCL 500 MG IV SOLR
500.0000 mg | Freq: Once | INTRAVENOUS | Status: AC
  Administered 2017-06-22: 500 mg via INTRAVENOUS
  Filled 2017-06-21: qty 500

## 2017-06-21 MED ORDER — SUGAMMADEX SODIUM 200 MG/2ML IV SOLN
INTRAVENOUS | Status: AC
  Filled 2017-06-21: qty 2

## 2017-06-21 MED ORDER — OMEPRAZOLE MAGNESIUM 20 MG PO TBEC: 20.0000 mg | DELAYED_RELEASE_TABLET | Freq: Every day | ORAL | Status: DC

## 2017-06-21 MED ORDER — ROCURONIUM BROMIDE 10 MG/ML (PF) SYRINGE
PREFILLED_SYRINGE | INTRAVENOUS | Status: DC | PRN
  Administered 2017-06-21: 40 mg via INTRAVENOUS

## 2017-06-21 MED ORDER — LIDOCAINE-EPINEPHRINE 1 %-1:100000 IJ SOLN
INTRAMUSCULAR | Status: DC | PRN
  Administered 2017-06-21: 2.5 mL

## 2017-06-21 SURGICAL SUPPLY — 43 items
BLADE CLIPPER SURG (BLADE) IMPLANT
BLADE SURG 15 STRL LF DISP TIS (BLADE) ×1 IMPLANT
BLADE SURG 15 STRL SS (BLADE) ×2
CARTRIDGE OIL MAESTRO DRILL (MISCELLANEOUS) IMPLANT
CEMENT KYPHON C01A KIT/MIXER (Cement) ×3 IMPLANT
DECANTER SPIKE VIAL GLASS SM (MISCELLANEOUS) IMPLANT
DERMABOND ADVANCED (GAUZE/BANDAGES/DRESSINGS) ×2
DERMABOND ADVANCED .7 DNX12 (GAUZE/BANDAGES/DRESSINGS) ×1 IMPLANT
DIFFUSER DRILL AIR PNEUMATIC (MISCELLANEOUS) IMPLANT
DRAPE C-ARM 42X72 X-RAY (DRAPES) ×3 IMPLANT
DRAPE HALF SHEET 40X57 (DRAPES) IMPLANT
DRAPE LAPAROTOMY 100X72X124 (DRAPES) ×3 IMPLANT
DRAPE SURG 17X23 STRL (DRAPES) ×3 IMPLANT
DRAPE WARM FLUID 44X44 (DRAPE) IMPLANT
DURAPREP 26ML APPLICATOR (WOUND CARE) ×3 IMPLANT
GAUZE SPONGE 4X4 16PLY XRAY LF (GAUZE/BANDAGES/DRESSINGS) ×3 IMPLANT
GLOVE BIO SURGEON STRL SZ8 (GLOVE) ×3 IMPLANT
GLOVE BIOGEL PI IND STRL 8 (GLOVE) ×2 IMPLANT
GLOVE BIOGEL PI IND STRL 8.5 (GLOVE) ×1 IMPLANT
GLOVE BIOGEL PI INDICATOR 8 (GLOVE) ×4
GLOVE BIOGEL PI INDICATOR 8.5 (GLOVE) ×2
GLOVE ECLIPSE 7.5 STRL STRAW (GLOVE) ×9 IMPLANT
GLOVE EXAM NITRILE LRG STRL (GLOVE) IMPLANT
GLOVE EXAM NITRILE XL STR (GLOVE) IMPLANT
GLOVE EXAM NITRILE XS STR PU (GLOVE) IMPLANT
GOWN STRL REUS W/ TWL LRG LVL3 (GOWN DISPOSABLE) IMPLANT
GOWN STRL REUS W/ TWL XL LVL3 (GOWN DISPOSABLE) ×1 IMPLANT
GOWN STRL REUS W/TWL 2XL LVL3 (GOWN DISPOSABLE) ×3 IMPLANT
GOWN STRL REUS W/TWL LRG LVL3 (GOWN DISPOSABLE)
GOWN STRL REUS W/TWL XL LVL3 (GOWN DISPOSABLE) ×2
KIT BASIN OR (CUSTOM PROCEDURE TRAY) ×3 IMPLANT
KIT ROOM TURNOVER OR (KITS) ×3 IMPLANT
NEEDLE HYPO 25X1 1.5 SAFETY (NEEDLE) ×3 IMPLANT
NS IRRIG 1000ML POUR BTL (IV SOLUTION) IMPLANT
OIL CARTRIDGE MAESTRO DRILL (MISCELLANEOUS)
PACK SURGICAL SETUP 50X90 (CUSTOM PROCEDURE TRAY) ×3 IMPLANT
PAD ARMBOARD 7.5X6 YLW CONV (MISCELLANEOUS) ×9 IMPLANT
STAPLER SKIN PROX WIDE 3.9 (STAPLE) IMPLANT
SUT VIC AB 3-0 SH 8-18 (SUTURE) ×3 IMPLANT
SYR CONTROL 10ML LL (SYRINGE) ×3 IMPLANT
TOWEL GREEN STERILE (TOWEL DISPOSABLE) ×3 IMPLANT
TOWEL GREEN STERILE FF (TOWEL DISPOSABLE) IMPLANT
TRAY KYPHOPAK 20/3 ONESTEP 1ST (MISCELLANEOUS) ×3 IMPLANT

## 2017-06-21 NOTE — Progress Notes (Signed)
Awake, alert, conversant.  MAEW with good strength.  Doing well. 

## 2017-06-21 NOTE — Interval H&P Note (Signed)
History and Physical Interval Note:  06/21/2017 5:04 PM  [name]  has presented today for surgery, with the diagnosis of Closed wedge compression fracture of fourth lumbar vertebra  The various methods of treatment have been discussed with the patient and family. After consideration of risks, benefits and other options for treatment, the patient has consented to  Procedure(s) with comments: L4 Kyphoplasty (N/A) - L4 Kyphoplasty as a surgical intervention .  The patient's history has been reviewed, patient examined, no change in status, stable for surgery.  I have reviewed the patient's chart and labs.  Questions were answered to the patient's satisfaction.     Toshua Honsinger D

## 2017-06-21 NOTE — Anesthesia Preprocedure Evaluation (Signed)
Anesthesia Evaluation  Patient identified by MRN, date of birth, ID band Patient awake    Reviewed: Allergy & Precautions, NPO status , Patient's Chart, lab work & pertinent test results  Airway Mallampati: I  TM Distance: >3 FB Neck ROM: Full    Dental   Pulmonary shortness of breath and with exertion,    Pulmonary exam normal        Cardiovascular hypertension, Pt. on medications Normal cardiovascular exam+ dysrhythmias Atrial Fibrillation + Valvular Problems/Murmurs MR      Neuro/Psych Anxiety    GI/Hepatic GERD  Medicated and Controlled,  Endo/Other    Renal/GU      Musculoskeletal   Abdominal   Peds  Hematology   Anesthesia Other Findings   Reproductive/Obstetrics                             Anesthesia Physical Anesthesia Plan  ASA: III  Anesthesia Plan: General   Post-op Pain Management:    Induction: Intravenous  PONV Risk Score and Plan: 3 and Ondansetron, Midazolam, Propofol infusion and Treatment may vary due to age or medical condition  Airway Management Planned: Oral ETT  Additional Equipment:   Intra-op Plan:   Post-operative Plan: Extubation in OR  Informed Consent: I have reviewed the patients History and Physical, chart, labs and discussed the procedure including the risks, benefits and alternatives for the proposed anesthesia with the patient or authorized representative who has indicated his/her understanding and acceptance.     Plan Discussed with: CRNA and Surgeon  Anesthesia Plan Comments:         Anesthesia Quick Evaluation

## 2017-06-21 NOTE — H&P (Signed)
Patient ID:   [id number] Patient: [name]  Date of Birth: 1936/01/04 Visit Type: Office Visit   Date: 06/18/2017 04:00 PM Provider: Marchia [name]. Annette Limber [name]   This 81 year old female presents for back pain.   History of Present Illness: 1.  back pain  I reviewed MRI findings with patient, which show L 4 compression fracture.  I re-examined her.  She is continuing to have severe low back pain, up to 9/10.  She is on Eliquis for A. Fib.  We discussed management options.  We can proceed with kyphoplasty this week or in three weeks, on my return from vacation.  Patient was very unsure.  I refilled Hydrocodone prescription.  I spent 45 minutes in direct face-to-face counseling with the patient and discussed her options and a potential plan.  In the end, she has decided to wait for surgery on my return.         Medical/Surgical/Interim History Reviewed, no change.  Last detailed document date:10/03/2015.     PAST MEDICAL HISTORY, SURGICAL HISTORY, FAMILY HISTORY, SOCIAL HISTORY AND REVIEW OF SYSTEMS I have reviewed the patient's past medical, surgical, family and social history as well as the comprehensive review of systems as included on the [facility] history form dated 01/11/2016, which I have signed.  Family History: Reviewed, no changes.  Last detailed document date:10/03/2015.   Social History: Reviewed, no changes. Last detailed document date: 10/03/2015.    MEDICATIONS(added, continued or stopped this visit): Started Medication Directions Instruction Stopped   Boniva 150 mg tablet take 1 tablet by oral route  every month on the same date; Take with a full glass of water and remain in an upright position for at least 60 minutes.     Citracal + D Maximum Take twice daily     diltiazem ER 180 mg capsule,24 hr,extended release take 1 capsule by oral route  every day     Eliquis 5 mg tablet take 1 tablet by oral route 2 times every day     flecainide 50 mg tablet take 1 tablet by oral route  every 12 hours     hydrocodone 5 mg-acetaminophen 325 mg tablet take 1 tablet by oral route  every 6 hours as needed for pain    06/18/2017 hydrocodone 5 mg-acetaminophen 325 mg tablet take 1 tablet by oral route  every 6-8 hours as needed for pain     Lasix 20 mg tablet take 1 tablet by oral route  every day     multivitamin Take once daily     Pravachol 20 mg tablet take 1 tablet by oral route  every day     Prilosec OTC 20 mg tablet,delayed release      Synthroid 112 mcg tablet take 1 tablet by oral route  every day     Vesicare 5 mg tablet take 1 tablet by oral route  every day     Vitamin D3 1,000 unit capsule Take once at lunch       ALLERGIES: Ingredient Reaction Medication Name Comment  PENICILLINS     IOVERSOL      Reviewed, no changes.    Vitals Date Temp F BP Pulse Ht In Wt Lb BMI BSA Pain Score  06/18/2017  167/89 76 60 107.4 20.97  8/10      IMPRESSION L 4 compression fracture  Patient is on an anti-coagulant, anti-inflammatory or supplement that may increase bleeding time. Patient advised to stop medicine prior to  surgery.  Comments:  Eliquis should stop 3 days pre-op. Pt aware  Completed Orders (this encounter) Order Details Reason Side Interpretation Result Initial Treatment Date Region  Hypertension education Patient to follow up with primary care provider.         Assessment/Plan # Detail Type Description   1. Assessment Low back pain, unspecified back pain laterality, with sciatica presence unspecified (M54.5).       2. Assessment Scoliosis (and kyphoscoliosis), idiopathic (M41.20).       3. Assessment Closed wedge compression fracture of fourth lumbar vertebra, initial encounter (S32.040A).       4. Assessment Essential (primary) hypertension (I10).           Pain Management Plan Pain Scale: 8/10. Method: Numeric Pain Intensity Scale. Location: back. Onset: 05/24/2017. Duration:  varies. Quality: discomforting. Pain management follow-up plan of care: Patient taking medication as prescribed..  L4 kyphoplasty.  Hold Eliquis prior to surgery.  Orders: Diagnostic Procedures: Assessment Procedure  S32.040A Lumbar Spine- AP/Lat  Instruction(s)/Education: Assessment Instruction  I10 Hypertension education    MEDICATIONS PRESCRIBED TODAY    Rx Quantity Refills  HYDROCODONE-ACETAMINOPHEN 5 mg-325 mg  60 0            Provider:  Vertell [name] MDMarchia [name] 06/18/2017 5:44 PM  Dictation edited by: [name]    CC Providers: [name] [facility] [place],  [place]  [id number]-   [name]  [address] [place], [place] [address]              Electronically signed by [name]. Annette Limber [name] on 06/18/2017 05:45 PM

## 2017-06-21 NOTE — Anesthesia Procedure Notes (Signed)
Procedure Name: Intubation Date/Time: 06/21/2017 5:20 PM Performed by: Myna Bright Pre-anesthesia Checklist: Patient identified, Emergency Drugs available, Suction available and Patient being monitored Patient Re-evaluated:Patient Re-evaluated prior to induction Oxygen Delivery Method: Circle system utilized Preoxygenation: Pre-oxygenation with 100% oxygen Induction Type: IV induction Ventilation: Mask ventilation without difficulty Laryngoscope Size: Mac and 3 Grade View: Grade I Tube type: Oral Tube size: 7.0 mm Number of attempts: 1 Airway Equipment and Method: Stylet Placement Confirmation: ETT inserted through vocal cords under direct vision,  positive ETCO2 and breath sounds checked- equal and bilateral Secured at: 21 cm Tube secured with: Tape Dental Injury: Teeth and Oropharynx as per pre-operative assessment

## 2017-06-21 NOTE — Progress Notes (Signed)
Pharmacy Antibiotic Note  [name] is a 81 y.o. female admitted on 06/21/2017 s/p spinal procedure. Pharmacy has been consulted for vancomycin dosing. No drain in place -CrCl ~ 40 -vancomycin 1000mg  given at ~ 5:30pm  Plan: -vancomycin 500mg  IV  x1 at 5:30am -Will sign off. Please contact pharmacy with any other needs.  Hildred Laser, Pharm D 06/21/2017 8:06 PM    Height: 4\' 11"  (149.9 cm) Weight: 108 lb 0.4 oz (49 kg) IBW/kg (Calculated) : 43.2  Temp (24hrs), Avg:98 F (36.7 C), Min:97.6 F (36.4 C), Max:98.5 F (36.9 C)   Recent Labs Lab 06/21/17 1441  WBC 7.4  CREATININE 0.74    Estimated Creatinine Clearance: 38.9 mL/min (by C-G formula based on SCr of 0.74 mg/dL).    Allergies  Allergen Reactions  . Contrast Media [Iodinated Diagnostic Agents] Shortness Of Breath  . Iohexol Shortness Of Breath  . Mucinex [Guaifenesin Er] Other (See Comments)    DIZZINESS  . Zanaflex [Tizanidine Hcl] Anxiety and Other (See Comments)    Dizziness  . Penicillins Rash    Has patient had a PCN reaction causing immediate rash, facial/tongue/throat swelling, SOB or lightheadedness with hypotension: No Has patient had a PCN reaction causing severe rash involving mucus membranes or skin necrosis: No Has patient had a PCN reaction that required hospitalization No Has patient had a PCN reaction occurring within the last 10 years: No If all of the above answers are "NO", then may proceed with Cephalosporin use.   . Robaxin [Methocarbamol] Diarrhea

## 2017-06-21 NOTE — Transfer of Care (Signed)
Immediate Anesthesia Transfer of Care Note  Patient: [name]  Procedure(s) Performed: Procedure(s): Lumbar four Kyphoplasty (N/A)  Patient Location: PACU  Anesthesia Type:General  Level of Consciousness: awake, alert , oriented and patient cooperative  Airway & Oxygen Therapy: Patient Spontanous Breathing and Patient connected to nasal cannula oxygen  Post-op Assessment: Report given to RN and Post -op Vital signs reviewed and stable  Post vital signs: Reviewed and stable  Last Vitals:  Vitals:   06/21/17 1439 06/21/17 1820  BP: (!) 172/82   Pulse: 75   Resp: 18   Temp: 36.9 C 36.4 C  SpO2: 96%     Last Pain:  Vitals:   06/21/17 1820  TempSrc:   PainSc: Asleep         Complications: No apparent anesthesia complications

## 2017-06-21 NOTE — Op Note (Signed)
06/21/2017  6:11 PM  PATIENT:  [name]  81 y.o. female  PRE-OPERATIVE DIAGNOSIS:  Closed wedge compression fracture of fourth lumbar vertebra  POST-OPERATIVE DIAGNOSIS:  Closed wedge compression fracture of fourth lumbar vertebra  PROCEDURE:  Procedure(s): Lumbar four Kyphoplasty (N/A)  SURGEON:  Surgeon(s) and Role:    Erline Levine, MD - Primary  PHYSICIAN ASSISTANT:   ASSISTANTS: none   ANESTHESIA:   general  EBL:  No intake/output data recorded.  BLOOD ADMINISTERED:none  DRAINS: none   LOCAL MEDICATIONS USED:  LIDOCAINE   SPECIMEN:  No Specimen  DISPOSITION OF SPECIMEN:  N/A  COUNTS:  YES  TOURNIQUET:  * No tourniquets in log *  DICTATION: DICTATION: Patient is 81 year old woman with advanced scoliosis.  She previously had an thoracic compression fractures and has now developed a painful L 4 compression fracture, which is proving debilitatingly painful to her.  It was elected to take her to surgery for kyphoplasty procedure.  PROCEDURE:  Following the smooth and uncomplicated induction of general endotracheal anesthesia, the patient was placed in a prone position on chest rolls.  C-arm fluoroscopy was positioned in both the AP and lateral planes, centered on the L 4 vertebra.  Her back was prepped and draped in the usual sterile fashion with Duraprep.  Using a right uni-pedicular approach, the right L 4 pedicle and vertebral body were entered with the trochar using standard landmarks.  The drill was used, followed by a 20 cc Kyphon balloon, which was used to re-expand the broken vertebra.  Subsequently, 6 cc of bone cement was placed into the void created by the balloon and was seen to fill the fracture cleft and fill the vertebra in both the AP and lateral direction with good interdigitation. There was a small amount of cement extravasated towards the spinal canal and when this was identified, the procedure was stopped.  The bone void filler was then removed.   Final X-ray demonstrated good filling within the fractured vertebra.  The incision was closed with a single 3-0 vicryl stitch and dressed with Dermabond. The patient was returned to the [place] and extubated in the OR and taken to Recovery in stable and satisfactory condition, having tolerated the procedure well.  Counts were correct at the end of the case.   PLAN OF CARE: Admit for overnight observation  PATIENT DISPOSITION:  PACU - hemodynamically stable.   Delay start of Pharmacological VTE agent (>24hrs) due to surgical blood loss or risk of bleeding: yes

## 2017-06-21 NOTE — Anesthesia Postprocedure Evaluation (Signed)
Anesthesia Post Note  Patient: [name]  Procedure(s) Performed: Procedure(s) (LRB): Lumbar four Kyphoplasty (N/A)     Patient location during evaluation: PACU Anesthesia Type: General Level of consciousness: awake and alert Pain management: pain level controlled Vital Signs Assessment: post-procedure vital signs reviewed and stable Respiratory status: spontaneous breathing, nonlabored ventilation, respiratory function stable and patient connected to nasal cannula oxygen Cardiovascular status: blood pressure returned to baseline and stable Postop Assessment: no signs of nausea or vomiting Anesthetic complications: no    Last Vitals:  Vitals:   06/21/17 1905 06/21/17 1920  BP: (!) 158/86 (!) 153/89  Pulse: 62 63  Resp: (!) 8 15  Temp:  36.7 C  SpO2: 94% 96%    Last Pain:  Vitals:   06/21/17 1930  TempSrc:   PainSc: 3                  Catalina Gravel

## 2017-06-21 NOTE — Brief Op Note (Signed)
06/21/2017  6:11 PM  PATIENT:  [name]  81 y.o. female  PRE-OPERATIVE DIAGNOSIS:  Closed wedge compression fracture of fourth lumbar vertebra  POST-OPERATIVE DIAGNOSIS:  Closed wedge compression fracture of fourth lumbar vertebra  PROCEDURE:  Procedure(s): Lumbar four Kyphoplasty (N/A)  SURGEON:  Surgeon(s) and Role:    Erline Levine, MD - Primary  PHYSICIAN ASSISTANT:   ASSISTANTS: none   ANESTHESIA:   general  EBL:  No intake/output data recorded.  BLOOD ADMINISTERED:none  DRAINS: none   LOCAL MEDICATIONS USED:  LIDOCAINE   SPECIMEN:  No Specimen  DISPOSITION OF SPECIMEN:  N/A  COUNTS:  YES  TOURNIQUET:  * No tourniquets in log *  DICTATION: DICTATION: Patient is 81 year old woman with advanced scoliosis.  She previously had an thoracic compression fractures and has now developed a painful L 4 compression fracture, which is proving debilitatingly painful to her.  It was elected to take her to surgery for kyphoplasty procedure.  PROCEDURE:  Following the smooth and uncomplicated induction of general endotracheal anesthesia, the patient was placed in a prone position on chest rolls.  C-arm fluoroscopy was positioned in both the AP and lateral planes, centered on the L 4 vertebra.  Her back was prepped and draped in the usual sterile fashion with Duraprep.  Using a right uni-pedicular approach, the right L 4 pedicle and vertebral body were entered with the trochar using standard landmarks.  The drill was used, followed by a 20 cc Kyphon balloon, which was used to re-expand the broken vertebra.  Subsequently, 6 cc of bone cement was placed into the void created by the balloon and was seen to fill the fracture cleft and fill the vertebra in both the AP and lateral direction with good interdigitation. There was a small amount of cement extravasated towards the spinal canal and when this was identified, the procedure was stopped.  The bone void filler was then removed.   Final X-ray demonstrated good filling within the fractured vertebra.  The incision was closed with a single 3-0 vicryl stitch and dressed with Dermabond. The patient was returned to the [place] and extubated in the OR and taken to Recovery in stable and satisfactory condition, having tolerated the procedure well.  Counts were correct at the end of the case.   PLAN OF CARE: Admit for overnight observation  PATIENT DISPOSITION:  PACU - hemodynamically stable.   Delay start of Pharmacological VTE agent (>24hrs) due to surgical blood loss or risk of bleeding: yes

## 2017-06-22 DIAGNOSIS — M545 Low back pain: Secondary | ICD-10-CM | POA: Diagnosis not present

## 2017-06-22 DIAGNOSIS — M4856XA Collapsed vertebra, not elsewhere classified, lumbar region, initial encounter for fracture: Secondary | ICD-10-CM | POA: Diagnosis not present

## 2017-06-22 DIAGNOSIS — Z7901 Long term (current) use of anticoagulants: Secondary | ICD-10-CM | POA: Diagnosis not present

## 2017-06-22 DIAGNOSIS — Z79899 Other long term (current) drug therapy: Secondary | ICD-10-CM | POA: Diagnosis not present

## 2017-06-22 DIAGNOSIS — I340 Nonrheumatic mitral (valve) insufficiency: Secondary | ICD-10-CM | POA: Diagnosis not present

## 2017-06-22 DIAGNOSIS — I10 Essential (primary) hypertension: Secondary | ICD-10-CM | POA: Diagnosis not present

## 2017-06-22 NOTE — Evaluation (Signed)
Occupational Therapy Evaluation Patient Details Name: [name] MRN: [id number] DOB: December 27, 1935 Today's Date: 06/22/2017    History of Present Illness Pt is a 81 yo female admitted for a L4 kyphoplasty for L4 pathological compression fracture. PMH significant for CHF, aneurysm, mitral regurgitation with severe pulmonary HTN, HTN, HLD, DVT, hypothyroid, breast CA with masectomy 2012, GERD, thoracic kyphoplasty 01/26/13.    Clinical Impression   PTA, pt was living alone and performing her ADLs and IADLs. Pt currently requiring Min A for UB ADLs and Min Guard A for LB ADLs for safety in standing. Provided education on back precautions (no formal order) to decrease pt back pain during ADLs as well as increase safety and comfort. Recommend dc home with [place] and [place]. However, educated pt on role of [place] and she stated she does not want HHOT at this time. Answered pt questions and provided education in preparation for dc later today. All acute OT needs met and will sign off.    Follow Up Recommendations  Home health OT;Other (comment);Supervision - Intermittent (Educated pt on HHOT. Pt not wanting HHOT at this time. ) [place] Recommendations  3 in 1 bedside commode    Recommendations for Other Services PT consult     Precautions / Restrictions Precautions Precautions: Fall;Back Precaution Booklet Issued: No Precaution Comments: No back precautions in chart. Educated pt on precautions to increase safety at home and decrease back pain Restrictions Weight Bearing Restrictions: No      Mobility Bed Mobility               General bed mobility comments: Pt OOB and in recliner upon arrival  Transfers Overall transfer level: Needs assistance Equipment used: Rolling walker (2 wheeled) Transfers: Sit to/from Stand Sit to Stand: Min guard         General transfer comment: min guard for safety from EOB    Balance Overall balance assessment: Needs  assistance Sitting-balance support: No upper extremity supported;Feet supported Sitting balance-Leahy Scale: Normal     Standing balance support: Bilateral upper extremity supported Standing balance-Leahy Scale: Fair Standing balance comment: Pt able to pull up underwear without UE support. Min guard for safety in standing                           ADL either performed or assessed with clinical judgement   ADL Overall ADL's : Needs assistance/impaired Eating/Feeding: Set up;Sitting   Grooming: Set up;Sitting   Upper Body Bathing: Minimal assistance;Sitting   Lower Body Bathing: Min guard;Sit to/from stand Lower Body Bathing Details (indicate cue type and reason): Min guard for safety Upper Body Dressing : Minimal assistance;Sitting Upper Body Dressing Details (indicate cue type and reason): Min A for donning bra. Pt with difficulty reaching. Educated pt on UB dressing while adhereing to back precautions. Pt with no formal back precaution order - educated to increase pt safety and comfort during ADLs Lower Body Dressing: Min guard;Sit to/from stand Lower Body Dressing Details (indicate cue type and reason): Pt donned underwear, pants, compression socks, and shoes with Min gaurd for safety in standing. Educated pt on AE, and pt states she does not like using AE and would rather continue without. Pt able to perform LB ADLs while adhereing to back precautions Toilet Transfer: Min guard;RW (Simulated at recliner)     Toileting - Clothing Manipulation Details (indicate cue type and reason): Educated pt on toilet hygiene techniques to adhere to  back precautions     Functional mobility during ADLs: Supervision/safety;Rolling walker General ADL Comments: Pt demonstrating decreased functional performance due to back pain with acitvity. Educated pt on back precautions to increase comfort and safety as well as decreased back pain. Pt stating that she will have aides come in to assist  with ADLs/IADLs.     Vision Baseline Vision/History: Wears glasses Wears Glasses: At all times Patient Visual Report: No change from baseline       Perception     Praxis      Pertinent Vitals/Pain Pain Assessment: 0-10 Pain Score: 8  Pain Location: low back Pain Descriptors / Indicators: Aching;Grimacing;Guarding;Sore Pain Intervention(s): Monitored during session;Limited activity within patient's tolerance;Repositioned     Hand Dominance Right   Extremity/Trunk Assessment Upper Extremity Assessment Upper Extremity Assessment: Generalized weakness   Lower Extremity Assessment Lower Extremity Assessment: Generalized weakness   Cervical / Trunk Assessment Cervical / Trunk Assessment: Other exceptions;Kyphotic (s/p L4  Kyphoplasty) Cervical / Trunk Exceptions: L4  Kyphoplasty   Communication Communication Communication: No difficulties   Cognition Arousal/Alertness: Awake/alert Behavior During Therapy: WFL for tasks assessed/performed Overall Cognitive Status: Within Functional Limits for tasks assessed                                     General Comments  Pt with bandage over spot at lower back. Pt verbalized concern - notified RN    Exercises     Shoulder Instructions      Home Living Family/patient expects to be discharged to:: Private residence Living Arrangements: Alone Available Help at Discharge: Friend(s);Personal care attendant;Available 24 hours/day;Available PRN/intermittently Type of Home: Apartment Home Access: Ramped entrance     Home Layout: One level     Bathroom Shower/Tub: Occupational psychologist: Handicapped height Bathroom Accessibility: Yes   Home Equipment: Environmental consultant - 4 wheels;Walker - 2 wheels;Cane - single point;Shower seat;Hand held shower head          Prior Functioning/Environment Level of Independence: Independent with assistive device(s)        Comments: Uses walker when out of house or if up  at night        OT Problem List: Decreased strength;Decreased activity tolerance;Impaired balance (sitting and/or standing);Decreased safety awareness;Decreased knowledge of use of DME or AE;Decreased knowledge of precautions;Pain      OT Treatment/Interventions:      OT Goals(Current goals can be found in the care plan section) Acute Rehab OT Goals Patient Stated Goal: to get home soon OT Goal Formulation: With patient Time For Goal Achievement: 07/06/17 Potential to Achieve Goals: Good  OT Frequency:     Barriers to D/C:            Co-evaluation              AM-PAC PT "6 Clicks" Daily Activity     Outcome Measure Help from another person eating meals?: None Help from another person taking care of personal grooming?: None Help from another person toileting, which includes using toliet, bedpan, or urinal?: A Little Help from another person bathing (including washing, rinsing, drying)?: A Little Help from another person to put on and taking off regular upper body clothing?: A Little Help from another person to put on and taking off regular lower body clothing?: A Little 6 Click Score: 20   End of Session Equipment Utilized During Treatment: Rolling walker Nurse Communication: Mobility status  Activity Tolerance: Patient limited by pain;Patient limited by fatigue Patient left: in chair;with call bell/phone within reach  OT Visit Diagnosis: Unsteadiness on feet (R26.81);Muscle weakness (generalized) (M62.81);Pain Pain - Right/Left:  (Back) Pain - part of body:  (Back)                Time: 0300-9233 OT Time Calculation (min): 32 min Charges:  OT General Charges $OT Visit: 1 Procedure OT Evaluation $OT Eval Low Complexity: 1 Procedure OT Treatments $Self Care/Home Management : 8-22 mins G-Codes: OT G-codes **NOT FOR INPATIENT CLASS** Functional Assessment Tool Used: Clinical judgement Functional Limitation: Self care Self Care Current Status (A0762): At least 1  percent but less than 20 percent impaired, limited or restricted Self Care Goal Status (U6333): At least 1 percent but less than 20 percent impaired, limited or restricted Self Care Discharge Status [phone]): At least 1 percent but less than 20 percent impaired, limited or restricted   [place], OTR/L Acute Rehab Pager: [phone] Office: [place] 06/22/2017, 11:21 AM

## 2017-06-22 NOTE — Progress Notes (Signed)
Patient alert and oriented, mae's well, voiding adequate amount of urine, swallowing without difficulty, no c/o pain at time of discharge. Patient discharged home with family. Script and discharged instructions given to patient. Patient and family stated understanding of instructions given. Patient has an appointment with Dr.Stern    

## 2017-06-22 NOTE — Care Management Obs Status (Signed)
[facility] NOTIFICATION   Patient Details  Name: [name] MRN: [id number] Date of Birth: August 19, 1936   Medicare Observation Status Notification Given:  Yes    Ninfa Meeker, RN 06/22/2017, 9:31 AM

## 2017-06-22 NOTE — Discharge Summary (Signed)
Physician Discharge Summary  Patient ID: [name] MRN: [id number] DOB/AGE: [birth year] 79 y.o.  Admit date: 06/21/2017 Discharge date: 06/22/2017  Admission Diagnoses:L4 pathologic compression fracture  Discharge Diagnoses: L4 pathologic compression fracture Active Problems:   Lumbar compression fracture [facility])   Discharged Condition: fair  Hospital Course: Patient was admitted to undergo acrylic balloon kyphoplasty which she tolerated well  Consults: None  Significant Diagnostic Studies: None  Treatments: surgery: L4 acrylic balloon kyphoplasty  Discharge Exam: Blood pressure 138/81, pulse 61, temperature (!) 97.5 F (36.4 C), resp. rate 20, height 4\' 11"  (1.499 m), weight 49 kg (108 lb 0.4 oz), SpO2 98 %. Station and gait are intact.  Disposition: 01-Home or Self Care  Discharge Instructions    Call MD for:  redness, tenderness, or signs of infection (pain, swelling, redness, odor or green/yellow discharge around incision site)    Complete by:  As directed    Call MD for:  severe uncontrolled pain    Complete by:  As directed    Call MD for:  temperature >100.4    Complete by:  As directed    Diet - low sodium heart healthy    Complete by:  As directed    Increase activity slowly    Complete by:  As directed      Allergies as of 06/22/2017      Reactions   Contrast Media [iodinated Diagnostic Agents] Shortness Of Breath   Iohexol Shortness Of Breath   Mucinex [guaifenesin Er] Other (See Comments)   DIZZINESS   Zanaflex [tizanidine Hcl] Anxiety, Other (See Comments)   Dizziness   Penicillins Rash   Has patient had a PCN reaction causing immediate rash, facial/tongue/throat swelling, SOB or lightheadedness with hypotension: No Has patient had a PCN reaction causing severe rash involving mucus membranes or skin necrosis: No Has patient had a PCN reaction that required hospitalization No Has patient had a PCN reaction occurring within the last 10 years:  No If all of the above answers are "NO", then may proceed with Cephalosporin use.   Robaxin [methocarbamol] Diarrhea      Medication List    TAKE these medications   apixaban 5 MG Tabs tablet Commonly known as:  ELIQUIS Take 1 tablet (5 mg total) by mouth 2 (two) times daily. What changed:  additional instructions   BONINE PO Take 1 tablet by mouth daily as needed (for ear/head stuffiness).   BONIVA 150 MG tablet Generic drug:  ibandronate Take 150 mg by mouth every 30 (thirty) days.   calcium citrate-vitamin D 315-200 MG-UNIT tablet Commonly known as:  CITRACAL+D Take 1 tablet by mouth 2 (two) times daily. What changed:  additional instructions   cephALEXin 500 MG capsule Commonly known as:  KEFLEX Take 1 capsule (500 mg total) by mouth 3 (three) times daily.   cholecalciferol 1000 units tablet Commonly known as:  VITAMIN D Take 1,000 Units by mouth daily with lunch. Vitamin D3   conjugated estrogens vaginal cream Commonly known as:  PREMARIN Place 1 Applicatorful vaginally 2 (two) times a week.   diltiazem 180 MG 24 hr capsule Commonly known as:  CARDIZEM CD Take 1 capsule (180 mg total) by mouth daily. What changed:  when to take this   flecainide 50 MG tablet Commonly known as:  TAMBOCOR Take 1 tablet (50 mg total) by mouth every 12 (twelve) hours. What changed:  additional instructions   furosemide 20 MG tablet Commonly known as:  LASIX Take 1 tablet (20 mg total)  by mouth every other day. What changed:  additional instructions   HYDROcodone-acetaminophen 5-325 MG tablet Commonly known as:  NORCO/VICODIN Take 1-2 tablets by mouth every 4 (four) hours as needed (mild pain). What changed:  reasons to take this   levothyroxine 125 MCG tablet Commonly known as:  SYNTHROID, LEVOTHROID Take 125 mcg by mouth daily before breakfast.   methocarbamol 500 MG tablet Commonly known as:  ROBAXIN Take 1 tablet (500 mg total) by mouth 2 (two) times daily as needed  for muscle spasms.   multivitamin tablet Take 1 tablet by mouth daily with breakfast.   omeprazole 20 MG tablet Commonly known as:  PRILOSEC OTC Take 20 mg by mouth daily before breakfast.   pravastatin 20 MG tablet Commonly known as:  PRAVACHOL Take 20 mg by mouth daily with supper.   pseudoephedrine 30 MG tablet Commonly known as:  SUDAFED Take 60 mg by mouth every 4 (four) hours as needed (for congestion/head or ear stuffiness).   solifenacin 5 MG tablet Commonly known as:  VESICARE Take 5 mg by mouth daily with breakfast.   traMADol 50 MG tablet Commonly known as:  ULTRAM Take 1 tablet (50 mg total) by mouth every 6 (six) hours as needed.   VICKS VAPORUB EX Apply 1 application topically daily as needed (toenail fungus).        SignedEarleen Newport 06/22/2017, 6:54 AM

## 2017-06-22 NOTE — Evaluation (Signed)
Physical Therapy Evaluation Patient Details Name: [name] MRN: [id number] DOB: July 26, 1936 Today's Date: 06/22/2017   History of Present Illness  Pt is a 81 yo female admitted for a L4 kyphoplasty for L4 pathological compression fracture. PMH significant for CHF, aneurysm, mitral regurgitation with severe pulmonary HTN, HTN, HLD, DVT, hypothyroid, breast CA with masectomy 2012, GERD, thoracic kyphoplasty 01/26/13.   Clinical Impression  Pt presents POD 1 following the above procedure. Prior to admission, pt lived alone in a single level apartment. Pt was independent with a RW prior to admission. Pt requires Min guard A for mobility for transfers and gait this session. Pt will benefit from continued acute PT services in order to address the below deficits and will benefit from continued therapy at discharge in order to maximize her outcomes. Pt reports she will have an agency coming in when she does return home to stay with her overnight.     Follow Up Recommendations Home health PT;Supervision for mobility/OOB    Equipment Recommendations  3in1 (PT)    Recommendations for Other Services       Precautions / Restrictions Precautions Precautions: Fall;Back Precaution Booklet Issued: No Precaution Comments: reviewed precautions for getting OOB, no back precautions in chart Restrictions Weight Bearing Restrictions: No      Mobility  Bed Mobility               General bed mobility comments: pt OOB in bathroom when PT arrives  Transfers Overall transfer level: Needs assistance Equipment used: Rolling walker (2 wheeled) Transfers: Sit to/from Stand Sit to Stand: Min guard         General transfer comment: min guard for safety from EOB  Ambulation/Gait Ambulation/Gait assistance: Min guard Ambulation Distance (Feet): 75 Feet Assistive device: Rolling walker (2 wheeled) Gait Pattern/deviations: Step-through pattern;Decreased stride length;Antalgic Gait velocity:  decreased Gait velocity interpretation: Below normal speed for age/gender General Gait Details: moderate antalgic gait due to pain in lumbar region. pt requires 2 rest breaks standing. Cues for positioning within RW  Stairs            Wheelchair Mobility    Modified Rankin (Stroke Patients Only)       Balance Overall balance assessment: Needs assistance Sitting-balance support: No upper extremity supported;Feet supported Sitting balance-Leahy Scale: Normal     Standing balance support: Bilateral upper extremity supported Standing balance-Leahy Scale: Poor Standing balance comment: reliant on UE support in standing                             Pertinent Vitals/Pain Pain Assessment: 0-10 Pain Score: 8  Pain Location: low back Pain Descriptors / Indicators: Aching;Grimacing;Guarding;Sore Pain Intervention(s): Monitored during session;Premedicated before session;Repositioned    Home Living Family/patient expects to be discharged to:: Private residence Living Arrangements: Alone Available Help at Discharge: Friend(s);Personal care attendant;Available 24 hours/day;Available PRN/intermittently Type of Home: Apartment Home Access: Ramped entrance     Home Layout: One level Home Equipment: Walker - 4 wheels;Walker - 2 wheels;Cane - single point;Shower seat;Hand held shower head      Prior Function Level of Independence: Independent with assistive device(s)         Comments: Uses walker when out of house or if up at night     Hand Dominance   Dominant Hand: Right    Extremity/Trunk Assessment   Upper Extremity Assessment Upper Extremity Assessment: Defer to OT evaluation    Lower Extremity Assessment Lower Extremity Assessment:  Generalized weakness    Cervical / Trunk Assessment Cervical / Trunk Assessment: Normal  Communication   Communication: No difficulties  Cognition Arousal/Alertness: Awake/alert Behavior During Therapy: WFL for tasks  assessed/performed Overall Cognitive Status: Within Functional Limits for tasks assessed                                        General Comments      Exercises     Assessment/Plan    PT Assessment Patient needs continued PT services  PT Problem List Decreased strength;Decreased activity tolerance;Decreased balance;Decreased mobility;Decreased knowledge of use of DME;Pain       PT Treatment Interventions DME instruction;Gait training;Functional mobility training;Therapeutic activities;Therapeutic exercise;Balance training;Patient/family education    PT Goals (Current goals can be found in the Care Plan section)  Acute Rehab PT Goals Patient Stated Goal: to get home soon PT Goal Formulation: With patient Time For Goal Achievement: 06/29/17 Potential to Achieve Goals: Good    Frequency Min 5X/week   Barriers to discharge        Co-evaluation               AM-PAC PT "6 Clicks" Daily Activity  Outcome Measure Difficulty turning over in bed (including adjusting bedclothes, sheets and blankets)?: Total Difficulty moving from lying on back to sitting on the side of the bed? : Total Difficulty sitting down on and standing up from a chair with arms (e.g., wheelchair, bedside commode, etc,.)?: Total Help needed moving to and from a bed to chair (including a wheelchair)?: A Little Help needed walking in hospital room?: A Little Help needed climbing 3-5 steps with a railing? : A Lot 6 Click Score: 11    End of Session Equipment Utilized During Treatment: Gait belt Activity Tolerance: Patient limited by pain Patient left: in chair;with call bell/phone within reach Nurse Communication: Mobility status PT Visit Diagnosis: Unsteadiness on feet (R26.81);Muscle weakness (generalized) (M62.81);Difficulty in walking, not elsewhere classified (R26.2);Pain Pain - Right/Left:  (lumbar) Pain - part of body:  (spine)    Time: 0626-9485 PT Time Calculation (min)  (ACUTE ONLY): 21 min   Charges:   PT Evaluation $PT Eval Moderate Complexity: 1 Mod     PT G Codes:   PT G-Codes **NOT FOR INPATIENT CLASS** Functional Assessment Tool Used: Clinical judgement;AM-PAC 6 Clicks Basic Mobility Functional Limitation: Mobility: Walking and moving around Mobility: Walking and Moving Around Current Status (I6270): At least 60 percent but less than 80 percent impaired, limited or restricted Mobility: Walking and Moving Around Goal Status [phone]): At least 1 percent but less than 20 percent impaired, limited or restricted    Scheryl Marten PT, DPT  [phone]   Shanon Rosser 06/22/2017, 9:13 AM

## 2017-06-22 NOTE — Care Management Note (Signed)
Case Management Note  Patient Details  Name: SHIRLEYMAE HAUTH MRN: [id number] Date of Birth: [birth year]  Subjective/Objective:    81 yr old female s/p L4 Kyphoplasty.                Action/Plan: Case manager spoke with patient concerning discharge plan and DME needs. Choice for [facility] was offered, patient requests [facility] and a therapist that she had before, [facility]. CM called referral to Melene Muller, [facility] Liaison. Patient says she has rolling walker, CM will request 3in1. She will have assistance at discharge.   Expected Discharge Date:  06/22/17               Expected Discharge Plan:  [place]  In-House Referral:  NA  Discharge planning Services  CM Consult  Post Acute Care Choice:  Durable Medical Equipment, Home Health Choice offered to:  Patient  DME Arranged:  3-N-1 DME Agency:  [place]:  PT [facility] Agency:  [place]  Status of Service:  Completed, signed off  If discussed at [place] of Stay Meetings, dates discussed:    Additional Comments:  Ninfa Meeker, RN 06/22/2017, 9:32 AM

## 2017-06-24 ENCOUNTER — Encounter (HOSPITAL_COMMUNITY): Payer: Self-pay | Admitting: Neurosurgery

## 2017-06-25 ENCOUNTER — Telehealth: Payer: Self-pay | Admitting: *Deleted

## 2017-06-25 DIAGNOSIS — I4891 Unspecified atrial fibrillation: Secondary | ICD-10-CM | POA: Diagnosis not present

## 2017-06-25 DIAGNOSIS — S32040D Wedge compression fracture of fourth lumbar vertebra, subsequent encounter for fracture with routine healing: Secondary | ICD-10-CM | POA: Diagnosis not present

## 2017-06-25 DIAGNOSIS — Z853 Personal history of malignant neoplasm of breast: Secondary | ICD-10-CM | POA: Diagnosis not present

## 2017-06-25 DIAGNOSIS — E039 Hypothyroidism, unspecified: Secondary | ICD-10-CM | POA: Diagnosis not present

## 2017-06-25 DIAGNOSIS — K219 Gastro-esophageal reflux disease without esophagitis: Secondary | ICD-10-CM | POA: Diagnosis not present

## 2017-06-25 DIAGNOSIS — I509 Heart failure, unspecified: Secondary | ICD-10-CM | POA: Diagnosis not present

## 2017-06-25 DIAGNOSIS — I110 Hypertensive heart disease with heart failure: Secondary | ICD-10-CM | POA: Diagnosis not present

## 2017-06-25 DIAGNOSIS — M4120 Other idiopathic scoliosis, site unspecified: Secondary | ICD-10-CM | POA: Diagnosis not present

## 2017-06-25 DIAGNOSIS — Z7901 Long term (current) use of anticoagulants: Secondary | ICD-10-CM | POA: Diagnosis not present

## 2017-06-25 DIAGNOSIS — E785 Hyperlipidemia, unspecified: Secondary | ICD-10-CM | POA: Diagnosis not present

## 2017-06-25 NOTE — Telephone Encounter (Signed)
-----   Message from Theodoro Parma, RN sent at 06/20/2017  5:41 PM EDT ----- Regarding: FW: Preop clearance This was faxed, just wanted to send in case it's needed again :) ----- Message ----- From: Dorothy Spark, MD Sent: 06/20/2017  11:22 AM To: Theodoro Parma, RN Subject: Preop clearance                                To Dr. Erline Levine,  [name] is currently under my care and there is no contraindication from cardiac standpoint for her to undergo scheduled L4 kyphoplasty. She should stop Eliquis 36 hours prior to planned surgery and restart as soon as acceptable from surgical standpoint. Because of known ascending aortic aneurysm her blood pressure should be well managed in a perioperative period.  Please call us with any questions.  Sincerely,  Ena Dawley, MD 06/20/2017

## 2017-06-28 DIAGNOSIS — I509 Heart failure, unspecified: Secondary | ICD-10-CM | POA: Diagnosis not present

## 2017-06-28 DIAGNOSIS — M4120 Other idiopathic scoliosis, site unspecified: Secondary | ICD-10-CM | POA: Diagnosis not present

## 2017-06-28 DIAGNOSIS — E039 Hypothyroidism, unspecified: Secondary | ICD-10-CM | POA: Diagnosis not present

## 2017-06-28 DIAGNOSIS — I4891 Unspecified atrial fibrillation: Secondary | ICD-10-CM | POA: Diagnosis not present

## 2017-06-28 DIAGNOSIS — I110 Hypertensive heart disease with heart failure: Secondary | ICD-10-CM | POA: Diagnosis not present

## 2017-06-28 DIAGNOSIS — S32040D Wedge compression fracture of fourth lumbar vertebra, subsequent encounter for fracture with routine healing: Secondary | ICD-10-CM | POA: Diagnosis not present

## 2017-07-01 DIAGNOSIS — I110 Hypertensive heart disease with heart failure: Secondary | ICD-10-CM | POA: Diagnosis not present

## 2017-07-01 DIAGNOSIS — I509 Heart failure, unspecified: Secondary | ICD-10-CM | POA: Diagnosis not present

## 2017-07-01 DIAGNOSIS — E039 Hypothyroidism, unspecified: Secondary | ICD-10-CM | POA: Diagnosis not present

## 2017-07-01 DIAGNOSIS — M4120 Other idiopathic scoliosis, site unspecified: Secondary | ICD-10-CM | POA: Diagnosis not present

## 2017-07-01 DIAGNOSIS — I4891 Unspecified atrial fibrillation: Secondary | ICD-10-CM | POA: Diagnosis not present

## 2017-07-01 DIAGNOSIS — S32040D Wedge compression fracture of fourth lumbar vertebra, subsequent encounter for fracture with routine healing: Secondary | ICD-10-CM | POA: Diagnosis not present

## 2017-07-03 DIAGNOSIS — S32040D Wedge compression fracture of fourth lumbar vertebra, subsequent encounter for fracture with routine healing: Secondary | ICD-10-CM | POA: Diagnosis not present

## 2017-07-03 DIAGNOSIS — M4120 Other idiopathic scoliosis, site unspecified: Secondary | ICD-10-CM | POA: Diagnosis not present

## 2017-07-03 DIAGNOSIS — I110 Hypertensive heart disease with heart failure: Secondary | ICD-10-CM | POA: Diagnosis not present

## 2017-07-03 DIAGNOSIS — I509 Heart failure, unspecified: Secondary | ICD-10-CM | POA: Diagnosis not present

## 2017-07-03 DIAGNOSIS — E039 Hypothyroidism, unspecified: Secondary | ICD-10-CM | POA: Diagnosis not present

## 2017-07-03 DIAGNOSIS — I4891 Unspecified atrial fibrillation: Secondary | ICD-10-CM | POA: Diagnosis not present

## 2017-07-08 DIAGNOSIS — I110 Hypertensive heart disease with heart failure: Secondary | ICD-10-CM | POA: Diagnosis not present

## 2017-07-08 DIAGNOSIS — S32040D Wedge compression fracture of fourth lumbar vertebra, subsequent encounter for fracture with routine healing: Secondary | ICD-10-CM | POA: Diagnosis not present

## 2017-07-08 DIAGNOSIS — I4891 Unspecified atrial fibrillation: Secondary | ICD-10-CM | POA: Diagnosis not present

## 2017-07-08 DIAGNOSIS — M4120 Other idiopathic scoliosis, site unspecified: Secondary | ICD-10-CM | POA: Diagnosis not present

## 2017-07-08 DIAGNOSIS — I509 Heart failure, unspecified: Secondary | ICD-10-CM | POA: Diagnosis not present

## 2017-07-08 DIAGNOSIS — E039 Hypothyroidism, unspecified: Secondary | ICD-10-CM | POA: Diagnosis not present

## 2017-07-10 DIAGNOSIS — I4891 Unspecified atrial fibrillation: Secondary | ICD-10-CM | POA: Diagnosis not present

## 2017-07-10 DIAGNOSIS — M4120 Other idiopathic scoliosis, site unspecified: Secondary | ICD-10-CM | POA: Diagnosis not present

## 2017-07-10 DIAGNOSIS — I509 Heart failure, unspecified: Secondary | ICD-10-CM | POA: Diagnosis not present

## 2017-07-10 DIAGNOSIS — I110 Hypertensive heart disease with heart failure: Secondary | ICD-10-CM | POA: Diagnosis not present

## 2017-07-10 DIAGNOSIS — S32040D Wedge compression fracture of fourth lumbar vertebra, subsequent encounter for fracture with routine healing: Secondary | ICD-10-CM | POA: Diagnosis not present

## 2017-07-10 DIAGNOSIS — E039 Hypothyroidism, unspecified: Secondary | ICD-10-CM | POA: Diagnosis not present

## 2017-07-15 DIAGNOSIS — I110 Hypertensive heart disease with heart failure: Secondary | ICD-10-CM | POA: Diagnosis not present

## 2017-07-15 DIAGNOSIS — M4120 Other idiopathic scoliosis, site unspecified: Secondary | ICD-10-CM | POA: Diagnosis not present

## 2017-07-15 DIAGNOSIS — E039 Hypothyroidism, unspecified: Secondary | ICD-10-CM | POA: Diagnosis not present

## 2017-07-15 DIAGNOSIS — I509 Heart failure, unspecified: Secondary | ICD-10-CM | POA: Diagnosis not present

## 2017-07-15 DIAGNOSIS — I4891 Unspecified atrial fibrillation: Secondary | ICD-10-CM | POA: Diagnosis not present

## 2017-07-15 DIAGNOSIS — S32040D Wedge compression fracture of fourth lumbar vertebra, subsequent encounter for fracture with routine healing: Secondary | ICD-10-CM | POA: Diagnosis not present

## 2017-07-16 DIAGNOSIS — I110 Hypertensive heart disease with heart failure: Secondary | ICD-10-CM | POA: Diagnosis not present

## 2017-07-16 DIAGNOSIS — S32040D Wedge compression fracture of fourth lumbar vertebra, subsequent encounter for fracture with routine healing: Secondary | ICD-10-CM | POA: Diagnosis not present

## 2017-07-16 DIAGNOSIS — E039 Hypothyroidism, unspecified: Secondary | ICD-10-CM | POA: Diagnosis not present

## 2017-07-16 DIAGNOSIS — I4891 Unspecified atrial fibrillation: Secondary | ICD-10-CM | POA: Diagnosis not present

## 2017-07-16 DIAGNOSIS — I509 Heart failure, unspecified: Secondary | ICD-10-CM | POA: Diagnosis not present

## 2017-07-16 DIAGNOSIS — M4120 Other idiopathic scoliosis, site unspecified: Secondary | ICD-10-CM | POA: Diagnosis not present

## 2017-07-17 DIAGNOSIS — S22000G Wedge compression fracture of unspecified thoracic vertebra, subsequent encounter for fracture with delayed healing: Secondary | ICD-10-CM | POA: Diagnosis not present

## 2017-07-17 DIAGNOSIS — M4120 Other idiopathic scoliosis, site unspecified: Secondary | ICD-10-CM | POA: Diagnosis not present

## 2017-07-17 DIAGNOSIS — S32040A Wedge compression fracture of fourth lumbar vertebra, initial encounter for closed fracture: Secondary | ICD-10-CM | POA: Diagnosis not present

## 2017-07-17 DIAGNOSIS — I10 Essential (primary) hypertension: Secondary | ICD-10-CM | POA: Diagnosis not present

## 2017-07-17 DIAGNOSIS — M545 Low back pain: Secondary | ICD-10-CM | POA: Diagnosis not present

## 2017-07-18 ENCOUNTER — Encounter: Payer: Self-pay | Admitting: Cardiology

## 2017-07-18 ENCOUNTER — Ambulatory Visit (INDEPENDENT_AMBULATORY_CARE_PROVIDER_SITE_OTHER): Payer: Medicare Other | Admitting: Cardiology

## 2017-07-18 VITALS — BP 124/62 | HR 74 | Ht 59.0 in | Wt 104.0 lb

## 2017-07-18 DIAGNOSIS — I7121 Aneurysm of the ascending aorta, without rupture: Secondary | ICD-10-CM

## 2017-07-18 DIAGNOSIS — I340 Nonrheumatic mitral (valve) insufficiency: Secondary | ICD-10-CM

## 2017-07-18 DIAGNOSIS — I712 Thoracic aortic aneurysm, without rupture, unspecified: Secondary | ICD-10-CM

## 2017-07-18 DIAGNOSIS — I480 Paroxysmal atrial fibrillation: Secondary | ICD-10-CM

## 2017-07-18 DIAGNOSIS — I5033 Acute on chronic diastolic (congestive) heart failure: Secondary | ICD-10-CM

## 2017-07-18 NOTE — Patient Instructions (Addendum)

## 2017-07-18 NOTE — Progress Notes (Signed)
Cardiology Office Note    Date:  07/18/2017  ID:  SHANEIL YAZDI, DOB 21-May-1936, MRN [id number] PCP:  [name], MD  Cardiologist:  New to Dr. Newt Minion. [facility]   Chief Complaint: f/u atrial fib  History of Present Illness:  [name] is a 81 y.o. female with history of osteoporosis, hyperlipidemia, hypertension, hypothyroidism, breast Ca (lumpx/ XRT 2012-3), DVT in 2012, GERD, 4.3cm ascending thoracic aneurysm (followed by Dr. Prescott Gum), anxiety, and recently diagnosed atrial fib/diastolic CHF/mitral regurg who presents for post-hospital follow-up. She was admitted in 11/2016 with SOB and found to have rapid atrial fib in the setting of acute respiratory failure with hypoxia and PNA. She was also treated for presumed diastolic CHF with IV Lasix. 2D echo 11/28/16: mild LVH, EF 55-60%, mild-mod AI, mod MR, severe LAE, mod RAE, severe TR, PASP 67. 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 She spontaneously converted to NSR on diltiazem and flecainide. At present time it is felt she does not have indication for surgery at this time but ultimately may require mitral valve repair with MAZE in the future. Last BMET/CBC 12/10/16 wnl, Cr 0.73, Hgb 13.0. She was continued on prior Eliquis. Remote nuc 2012 wnl.  She presents back for follow-up overall doing well. She does feel tired at times. She is trying to build up her endurance.  She denies any CP, SOB, palpitations, syncope or bleeding. + intermittent mild LEE. She does admit she likes sea salt. States she saw her PCP who did bloodwork but she hasn't heard back yet. She has many questions about her recent diagnoses and I tried my best to answer all of them. Her husband died in Oct 07, 2023.  04-23-2017 - this is 3 months follow-up, the patient states that she feels tired without  energy all the time, this is significantly worse when compared to a year ago. She denies any chest pain or SOB, no palpitations. No bleeding. She has on and off LE edema in her feet and ankles. Ocassional dizziness, no falls.   07/18/2017 - 4 months follow-up, patient denies any palpitations. She recently underwent  L4 kyphoplasty without any complications. She held Eliquis 2 days prior. She denies any lower extremity edema orthopnea or proximal nocturnal dyspnea. She is taking Lasix 20 mg every other day. She has no claudications. The patient is being followed for her ascending aortic aneurysm by Dr. Prescott Gum annually with most recent dimension 47 mm that has been stable. However in his last office visit he stated that she would be too high risk for surgery at this point.  Past Medical History:  Diagnosis Date  . Anxiety   . Aortic regurgitation   . Breast cancer ([place]) 08/20/2011   R breast DCIS, ER/PR +  . Cataract 3 and 10-07-91   bilateral  . Chronic diastolic CHF (congestive heart failure) ([place])   . Compression fracture of fourth lumbar vertebra ([facility])   . DVT (deep venous thrombosis) ([place]) 10/07/2011   LL extremity   . Fibromyalgia   . Fracture lumbar vertebra-closed ([place])   . Fracture of thoracic vertebra, closed ([place])   . GERD (gastroesophageal reflux disease)   . H/O hiatal hernia   . History of blood clots   . History of radiation therapy 01/2012   R breast  . Hypercholesteremia   . Hypertension  DR Orinda Kenner  . Hypothyroidism   . Mitral regurgitation    12/06/16 TEE: Moderate MR with a flail P2 segment that billows and thickened leaflets suggestive of form fruste variant of Barlow&'s disease  . Osteoporosis   . PAF (paroxysmal atrial fibrillation) ([place])    a. dx 11/2016.  . Rib fractures   . Thoracic ascending aortic aneurysm ([place])    a. followed by Dr. Prescott Gum.    Past Surgical History:  Procedure Laterality Date  . ANTERIOR CERVICAL DECOMP/DISCECTOMY FUSION N/A  09/09/2015   Procedure: Anterior Cervical Decompression and Fusion Cervical seven-Thorasic one ;  Surgeon: Erline Levine, MD;  Location: [facility];  Service: Neurosurgery;  Laterality: N/A;  . APPENDECTOMY  1940  . BREAST SURGERY    . CATARACT EXTRACTION  1992  . EYE SURGERY  1940, 1956  . HERNIA REPAIR  10/16/2006   [facility] - Dr Hassell Done  . KYPHOPLASTY N/A 01/26/2013   Procedure: KYPHOPLASTY;  Surgeon: Kristeen Miss, MD;  Location: [facility];  Service: Neurosurgery;  Laterality: N/A;  T11 and L1  . KYPHOPLASTY N/A 06/21/2017   Procedure: Lumbar four Kyphoplasty;  Surgeon: Erline Levine, MD;  Location: [facility];  Service: Neurosurgery;  Laterality: N/A;  . MASTECTOMY, PARTIAL  10/17/2011   Procedure: MASTECTOMY PARTIAL;  Surgeon: Haywood Lasso, MD;  Location: [facility];  Service: General;  Laterality: Right;  needle guided  . TEE WITHOUT CARDIOVERSION N/A 12/04/2016   Procedure: TRANSESOPHAGEAL ECHOCARDIOGRAM (TEE);  Surgeon: Pixie Casino, MD;  Location[facility];  Service: Cardiovascular;  Laterality: N/A;  . TONSILLECTOMY  1944    Current Medications: Current Outpatient Prescriptions  Medication Sig Dispense Refill  . apixaban (ELIQUIS) 5 MG TABS tablet Take 1 tablet (5 mg total) by mouth 2 (two) times daily. (Patient taking differently: Take 5 mg by mouth 2 (two) times daily. With breakfast & with supper) 180 tablet 3  . BONIVA 150 MG tablet Take 150 mg by mouth every 30 (thirty) days.     . calcium citrate-vitamin D (CITRACAL+D) 315-200 MG-UNIT per tablet Take 1 tablet by mouth 2 (two) times daily. (Patient taking differently: Take 1 tablet by mouth 2 (two) times daily. With breakfast & with supper) 60 tablet 11  . Camphor-Eucalyptus-Menthol (VICKS VAPORUB EX) Apply 1 application topically daily as needed (toenail fungus).     . cholecalciferol (VITAMIN D) 1000 UNITS tablet Take 1,000 Units by mouth daily with lunch. Vitamin D3    . conjugated estrogens (PREMARIN) vaginal cream Place 1  Applicatorful vaginally 2 (two) times a week.    . diltiazem (CARDIZEM CD) 180 MG 24 hr capsule Take 1 capsule (180 mg total) by mouth daily. (Patient taking differently: Take 180 mg by mouth daily with breakfast. ) 90 capsule 3  . flecainide (TAMBOCOR) 50 MG tablet Take 1 tablet (50 mg total) by mouth every 12 (twelve) hours. (Patient taking differently: Take 50 mg by mouth every 12 (twelve) hours. With breakfast & at bedtime) 180 tablet 3  . furosemide (LASIX) 20 MG tablet Take 1 tablet (20 mg total) by mouth every other day. (Patient taking differently: Take 20 mg by mouth every other day. With breakfast) 45 tablet 3  . HYDROcodone-acetaminophen (NORCO/VICODIN) 5-325 MG tablet Take 1-2 tablets by mouth every 4 (four) hours as needed (mild pain). (Patient taking differently: Take 1-2 tablets by mouth every 4 (four) hours as needed (for back pain.). ) 30 tablet 0  . levothyroxine (SYNTHROID, LEVOTHROID) 125 MCG tablet Take  125 mcg by mouth daily before breakfast.    . Meclizine HCl (BONINE PO) Take 1 tablet by mouth daily as needed (for ear/head stuffiness).     . Multiple Vitamin (MULTIVITAMIN) tablet Take 1 tablet by mouth daily with breakfast.     . omeprazole (PRILOSEC OTC) 20 MG tablet Take 20 mg by mouth daily before breakfast.     . pravastatin (PRAVACHOL) 20 MG tablet Take 20 mg by mouth daily with supper.     . pseudoephedrine (SUDAFED) 30 MG tablet Take 60 mg by mouth every 4 (four) hours as needed (for congestion/head or ear stuffiness).     . solifenacin (VESICARE) 5 MG tablet Take 5 mg by mouth daily with breakfast.      No current facility-administered medications for this visit.      Allergies:   Contrast media [iodinated diagnostic agents]; Iohexol; Mucinex [guaifenesin er]; Zanaflex [tizanidine hcl]; Penicillins; and Robaxin [methocarbamol]   Social History   Social History  . Marital status: Married    Spouse name: N/A  . Number of children: N/A  . Years of education: N/A     Social History Main Topics  . Smoking status: Never Smoker  . Smokeless tobacco: Never Used  . Alcohol use No  . Drug use: No  . Sexual activity: No   Other Topics Concern  . None   Social History Narrative  . None     Family History:  The patient's family history includes Heart disease in her maternal grandfather, maternal uncle, and mother.   ROS:   Please see the history of present illness.  All other systems are reviewed and otherwise negative.    PHYSICAL EXAM:   VS:  BP 124/62   Pulse 74   Ht 4\' 11"  (1.499 m)   Wt 104 lb (47.2 kg)   BMI 21.01 kg/m   BMI: Body mass index is 21.01 kg/m. GEN: Thin [name] in no acute distress  HEENT: normocephalic, atraumatic, + strabismus Neck: no JVD, carotid bruits, or masses Cardiac: RRR; 4/6 holosystolic murmur at apex, no rubs or gallops, minimal pedal edema bilaterally Respiratory:  clear to auscultation bilaterally, normal work of breathing GI: soft, nontender, nondistended, + BS MS: significant kyphosis Skin: warm and dry, no rash Neuro:  Alert and Oriented x 3, Strength and sensation are intact, follows commands Psych: euthymic mood, full affect  Wt Readings from Last 3 Encounters:  07/18/17 104 lb (47.2 kg)  06/21/17 108 lb 0.4 oz (49 kg)  05/30/17 108 lb (49 kg)      Studies/Labs Reviewed:   EKG:  EKG was ordered today and personally reviewed by me and demonstrates NSR 67bpm, possible LAE, LAFB, prior anterior infarct possibly noted, QTc 435ms  Recent Labs: 11/27/2016: B Natriuretic Peptide 443.6; TSH 2.994 11/29/2016: Magnesium 1.9 06/21/2017: BUN 19; Creatinine, Ser 0.74; Hemoglobin 14.2; Platelets 192; Potassium 3.9; Sodium 140   Additional studies/ records that were reviewed today include: Summarized above.   ASSESSMENT & PLAN:   1. Paroxysmal atrial fib - maintaining NSR on diltiazem 180 mg and flecainide. 2D echo showed normal LVEF.  2. Mitral regurgitation - most recent TEE on 12/04/2016 showed  Partially flail P2 segment with moderate, posteriorly directed regurgitation. Effective regurgitant orifice   (PISA): 0.33 cm^2. Regurgitant volume (PISA): 40 mlat least moderate on echocardiogram, however she is not a surgical candidate per Dr. Prescott Gum, we will focus on heart failure management. 3. Acute on chronic diastolic CHF - euvolemic on Lasix 20  mg every other day. 4. Severe pulmonary hypertension - most probably sec to mitral regurgitation. Improved to moderate on most recent echocardiogram. 5. Essential HTN - controlled. 6. Ascending aortic aneurysm - stable 47 mm on CT in January 2018, this will be repeated in a year.    Disposition: F/u with Dr. Meda Coffee in 6 months.   Medication Adjustments/Labs and Tests Ordered: Current medicines are reviewed at length with the patient today.  Concerns regarding medicines are outlined above. Medication changes, Labs and Tests ordered today are summarized above and listed in the Patient Instructions accessible in Encounters.   Signed, Ena Dawley, MD 07/18/2017 1:39 PM    [facility] [address], [place], [place]  [address] Phone: [phone]; Fax: [phone]

## 2017-07-22 DIAGNOSIS — I509 Heart failure, unspecified: Secondary | ICD-10-CM | POA: Diagnosis not present

## 2017-07-22 DIAGNOSIS — I4891 Unspecified atrial fibrillation: Secondary | ICD-10-CM | POA: Diagnosis not present

## 2017-07-22 DIAGNOSIS — S32040D Wedge compression fracture of fourth lumbar vertebra, subsequent encounter for fracture with routine healing: Secondary | ICD-10-CM | POA: Diagnosis not present

## 2017-07-22 DIAGNOSIS — M4120 Other idiopathic scoliosis, site unspecified: Secondary | ICD-10-CM | POA: Diagnosis not present

## 2017-07-22 DIAGNOSIS — I110 Hypertensive heart disease with heart failure: Secondary | ICD-10-CM | POA: Diagnosis not present

## 2017-07-22 DIAGNOSIS — E039 Hypothyroidism, unspecified: Secondary | ICD-10-CM | POA: Diagnosis not present

## 2017-07-31 DIAGNOSIS — S32040D Wedge compression fracture of fourth lumbar vertebra, subsequent encounter for fracture with routine healing: Secondary | ICD-10-CM | POA: Diagnosis not present

## 2017-07-31 DIAGNOSIS — I509 Heart failure, unspecified: Secondary | ICD-10-CM | POA: Diagnosis not present

## 2017-07-31 DIAGNOSIS — E039 Hypothyroidism, unspecified: Secondary | ICD-10-CM | POA: Diagnosis not present

## 2017-07-31 DIAGNOSIS — I4891 Unspecified atrial fibrillation: Secondary | ICD-10-CM | POA: Diagnosis not present

## 2017-07-31 DIAGNOSIS — M4120 Other idiopathic scoliosis, site unspecified: Secondary | ICD-10-CM | POA: Diagnosis not present

## 2017-07-31 DIAGNOSIS — I110 Hypertensive heart disease with heart failure: Secondary | ICD-10-CM | POA: Diagnosis not present

## 2017-08-05 DIAGNOSIS — N814 Uterovaginal prolapse, unspecified: Secondary | ICD-10-CM | POA: Diagnosis not present

## 2017-08-05 DIAGNOSIS — N8111 Cystocele, midline: Secondary | ICD-10-CM | POA: Diagnosis not present

## 2017-08-07 DIAGNOSIS — M4120 Other idiopathic scoliosis, site unspecified: Secondary | ICD-10-CM | POA: Diagnosis not present

## 2017-08-07 DIAGNOSIS — I110 Hypertensive heart disease with heart failure: Secondary | ICD-10-CM | POA: Diagnosis not present

## 2017-08-07 DIAGNOSIS — E039 Hypothyroidism, unspecified: Secondary | ICD-10-CM | POA: Diagnosis not present

## 2017-08-07 DIAGNOSIS — S32040D Wedge compression fracture of fourth lumbar vertebra, subsequent encounter for fracture with routine healing: Secondary | ICD-10-CM | POA: Diagnosis not present

## 2017-08-07 DIAGNOSIS — I4891 Unspecified atrial fibrillation: Secondary | ICD-10-CM | POA: Diagnosis not present

## 2017-08-07 DIAGNOSIS — I509 Heart failure, unspecified: Secondary | ICD-10-CM | POA: Diagnosis not present

## 2017-08-13 DIAGNOSIS — M4120 Other idiopathic scoliosis, site unspecified: Secondary | ICD-10-CM | POA: Diagnosis not present

## 2017-08-13 DIAGNOSIS — I4891 Unspecified atrial fibrillation: Secondary | ICD-10-CM | POA: Diagnosis not present

## 2017-08-13 DIAGNOSIS — S32040D Wedge compression fracture of fourth lumbar vertebra, subsequent encounter for fracture with routine healing: Secondary | ICD-10-CM | POA: Diagnosis not present

## 2017-08-13 DIAGNOSIS — E039 Hypothyroidism, unspecified: Secondary | ICD-10-CM | POA: Diagnosis not present

## 2017-08-13 DIAGNOSIS — I110 Hypertensive heart disease with heart failure: Secondary | ICD-10-CM | POA: Diagnosis not present

## 2017-08-13 DIAGNOSIS — I509 Heart failure, unspecified: Secondary | ICD-10-CM | POA: Diagnosis not present

## 2017-08-23 DIAGNOSIS — M810 Age-related osteoporosis without current pathological fracture: Secondary | ICD-10-CM | POA: Diagnosis not present

## 2017-08-23 DIAGNOSIS — K219 Gastro-esophageal reflux disease without esophagitis: Secondary | ICD-10-CM | POA: Diagnosis not present

## 2017-08-23 DIAGNOSIS — M1990 Unspecified osteoarthritis, unspecified site: Secondary | ICD-10-CM | POA: Diagnosis not present

## 2017-08-23 DIAGNOSIS — I10 Essential (primary) hypertension: Secondary | ICD-10-CM | POA: Diagnosis not present

## 2017-08-23 DIAGNOSIS — Z23 Encounter for immunization: Secondary | ICD-10-CM | POA: Diagnosis not present

## 2017-08-23 DIAGNOSIS — I480 Paroxysmal atrial fibrillation: Secondary | ICD-10-CM | POA: Diagnosis not present

## 2017-08-23 DIAGNOSIS — S32040D Wedge compression fracture of fourth lumbar vertebra, subsequent encounter for fracture with routine healing: Secondary | ICD-10-CM | POA: Diagnosis not present

## 2017-09-05 DIAGNOSIS — R1030 Lower abdominal pain, unspecified: Secondary | ICD-10-CM | POA: Diagnosis not present

## 2017-09-05 DIAGNOSIS — R300 Dysuria: Secondary | ICD-10-CM | POA: Diagnosis not present

## 2017-09-16 DIAGNOSIS — M4120 Other idiopathic scoliosis, site unspecified: Secondary | ICD-10-CM | POA: Diagnosis not present

## 2017-09-16 DIAGNOSIS — S22000G Wedge compression fracture of unspecified thoracic vertebra, subsequent encounter for fracture with delayed healing: Secondary | ICD-10-CM | POA: Diagnosis not present

## 2017-09-16 DIAGNOSIS — M5412 Radiculopathy, cervical region: Secondary | ICD-10-CM | POA: Diagnosis not present

## 2017-09-16 DIAGNOSIS — M545 Low back pain: Secondary | ICD-10-CM | POA: Diagnosis not present

## 2017-10-28 DIAGNOSIS — N952 Postmenopausal atrophic vaginitis: Secondary | ICD-10-CM | POA: Diagnosis not present

## 2017-10-28 DIAGNOSIS — N8111 Cystocele, midline: Secondary | ICD-10-CM | POA: Diagnosis not present

## 2017-10-28 DIAGNOSIS — N814 Uterovaginal prolapse, unspecified: Secondary | ICD-10-CM | POA: Diagnosis not present

## 2017-11-06 DIAGNOSIS — D509 Iron deficiency anemia, unspecified: Secondary | ICD-10-CM | POA: Diagnosis not present

## 2017-11-06 DIAGNOSIS — R29898 Other symptoms and signs involving the musculoskeletal system: Secondary | ICD-10-CM | POA: Diagnosis not present

## 2017-11-06 DIAGNOSIS — E039 Hypothyroidism, unspecified: Secondary | ICD-10-CM | POA: Diagnosis not present

## 2017-11-18 DIAGNOSIS — L821 Other seborrheic keratosis: Secondary | ICD-10-CM | POA: Diagnosis not present

## 2017-11-18 DIAGNOSIS — D485 Neoplasm of uncertain behavior of skin: Secondary | ICD-10-CM | POA: Diagnosis not present

## 2017-11-18 DIAGNOSIS — L820 Inflamed seborrheic keratosis: Secondary | ICD-10-CM | POA: Diagnosis not present

## 2017-11-18 DIAGNOSIS — Z85828 Personal history of other malignant neoplasm of skin: Secondary | ICD-10-CM | POA: Diagnosis not present

## 2017-11-18 DIAGNOSIS — R239 Unspecified skin changes: Secondary | ICD-10-CM | POA: Diagnosis not present

## 2017-11-22 DIAGNOSIS — H1031 Unspecified acute conjunctivitis, right eye: Secondary | ICD-10-CM | POA: Diagnosis not present

## 2017-11-28 DIAGNOSIS — D485 Neoplasm of uncertain behavior of skin: Secondary | ICD-10-CM | POA: Diagnosis not present

## 2017-11-28 DIAGNOSIS — Z85828 Personal history of other malignant neoplasm of skin: Secondary | ICD-10-CM | POA: Diagnosis not present

## 2017-11-28 DIAGNOSIS — L905 Scar conditions and fibrosis of skin: Secondary | ICD-10-CM | POA: Diagnosis not present

## 2017-12-26 IMAGING — CR DG CHEST 1V PORT
1 series · 1 of 1 positions shown · non-contrast
Comparison: 11/29/2016

CLINICAL DATA: Pneumonia

EXAM:
PORTABLE CHEST 1 VIEW

[portable]
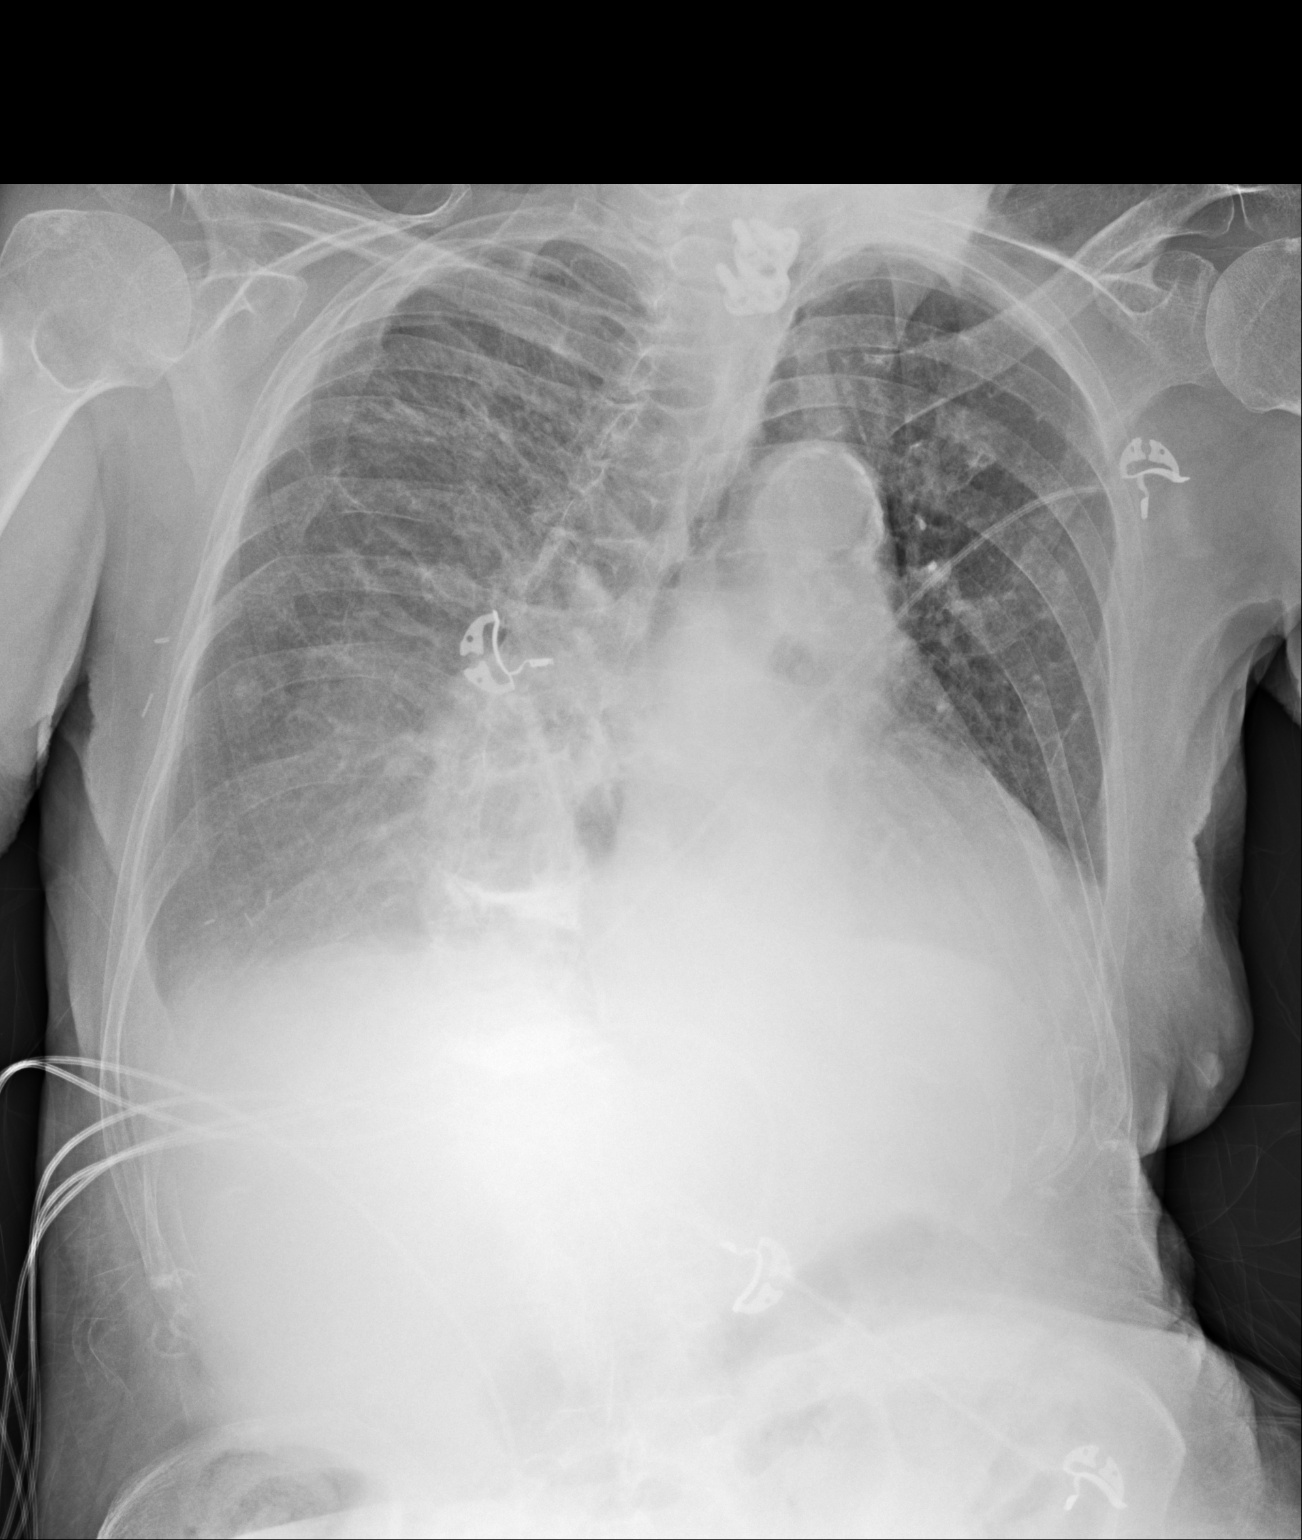

[1 of 1 positions shown; findings below may reference images not displayed]

FINDINGS: Patient is rotated to the left. There is cardiomegaly with vascular
congestion heel bilateral airspace opacities are noted, right
greater than left. This could represent asymmetric edema or
infection. Airspace disease may have worsened in the right lung
since prior study. Stable small right effusion. No acute bony
abnormality.
IMPRESSION: Asymmetric bilateral airspace disease, right greater than left,
slightly worsened since prior study.

Small right effusion.

## 2017-12-27 DIAGNOSIS — N8111 Cystocele, midline: Secondary | ICD-10-CM | POA: Diagnosis not present

## 2017-12-27 DIAGNOSIS — N814 Uterovaginal prolapse, unspecified: Secondary | ICD-10-CM | POA: Diagnosis not present

## 2017-12-28 IMAGING — CT CT CHEST W/O CM
2 of 4 series · 15 of 36 positions shown, 18 images · non-contrast
Comparison: 09/04/2015

CLINICAL DATA: Worsening shortness of breath for 3 weeks.

EXAM:
CT CHEST WITHOUT CONTRAST
TECHNIQUE: Multidetector CT imaging of the chest was performed following the
standard protocol without IV contrast.

[Series 2: chest w/o 2mm st · axial · non-contrast · 0.56mm/px · z∈[+969,+1193]mm · 12 of 134 slices shown, 15 images]
[im 11/134  mediastinal]
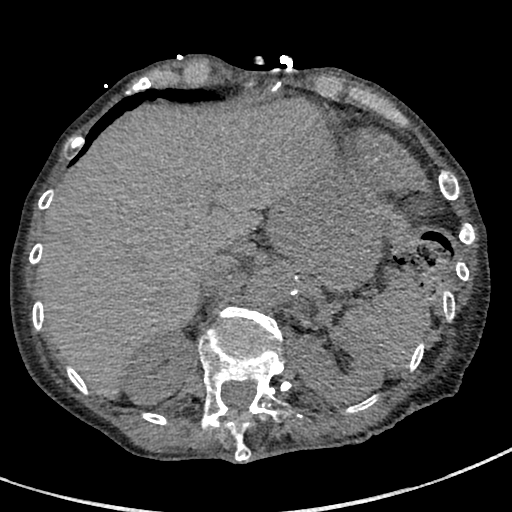
[im 11/134  lung]
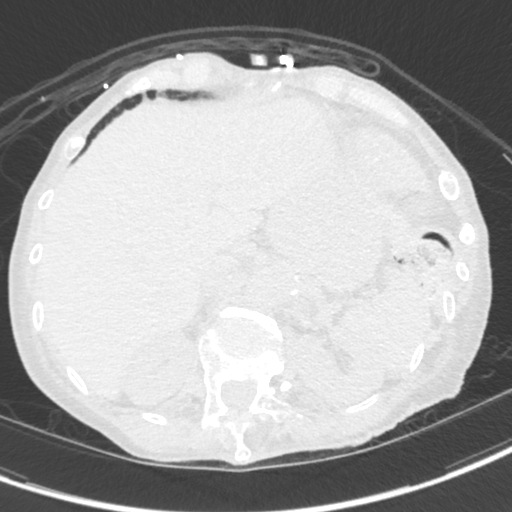
[im 21/134  lung]
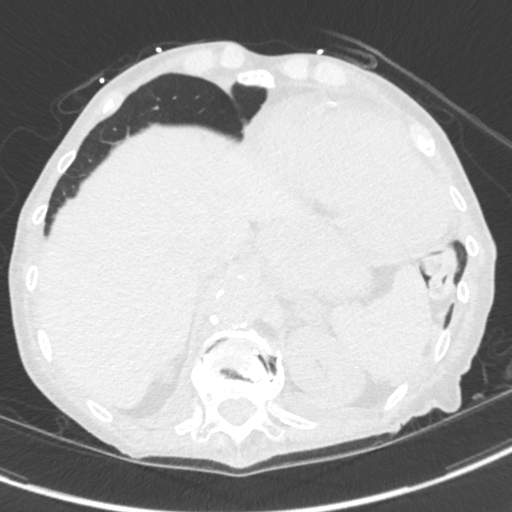
[im 31/134  lung]
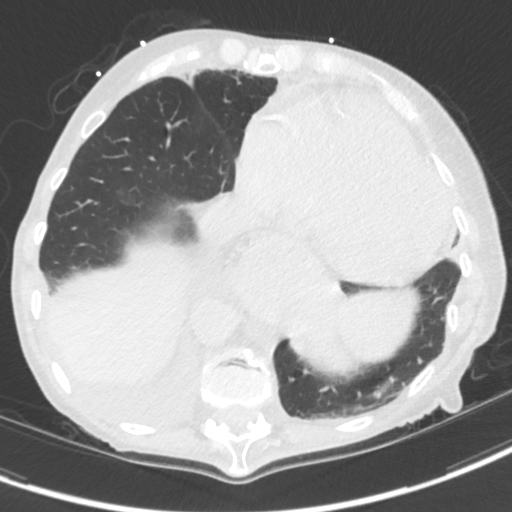
[im 41/134  lung]
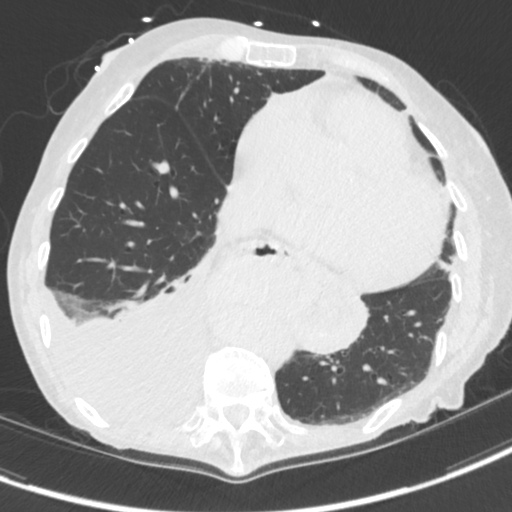
[im 52/134  mediastinal]
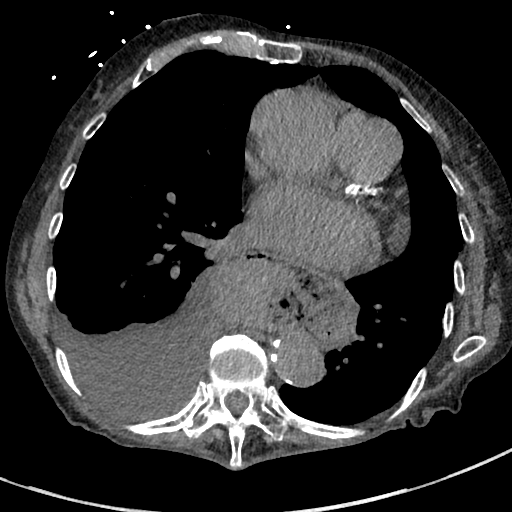
[im 52/134  lung]
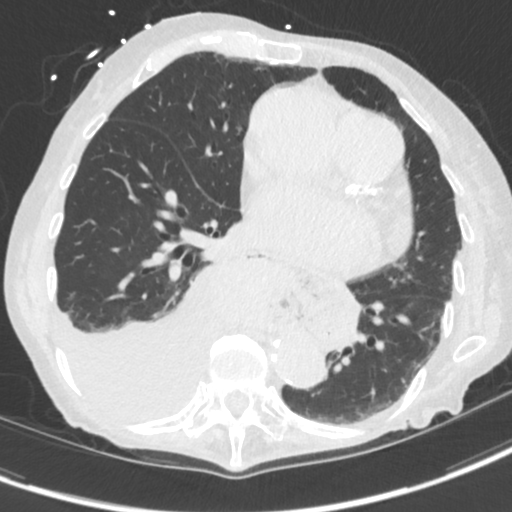
[im 62/134  lung]
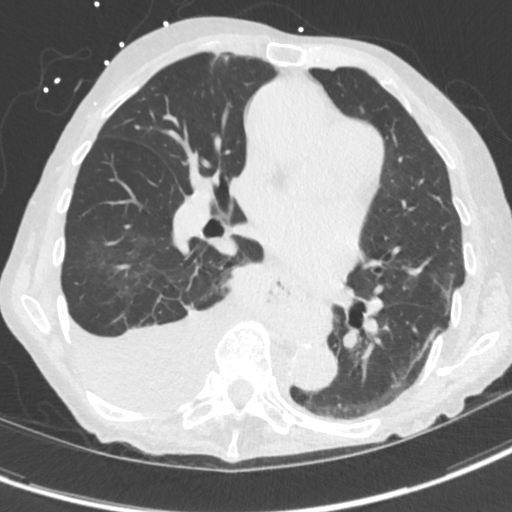
[im 72/134  lung]
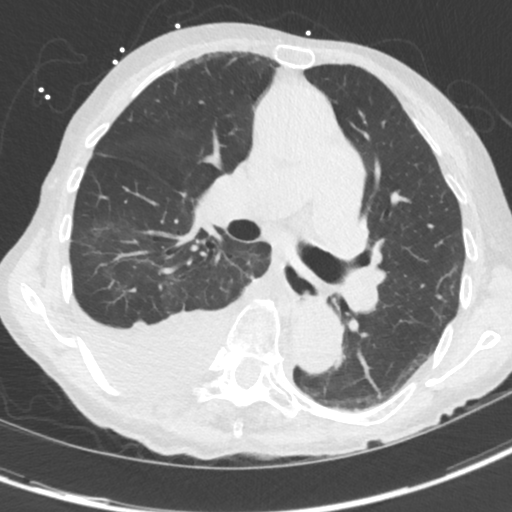
[im 82/134  lung]
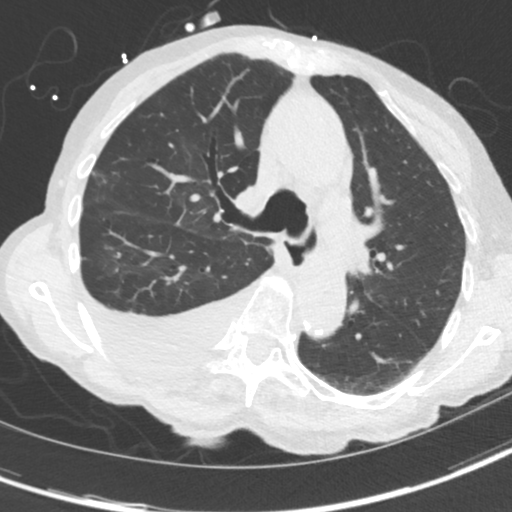
[im 93/134  mediastinal]
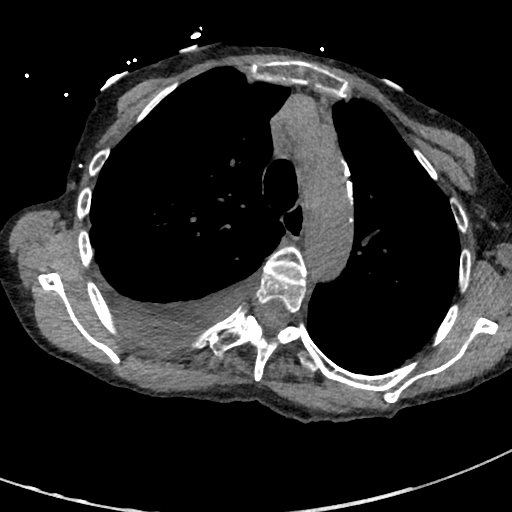
[im 93/134  lung]
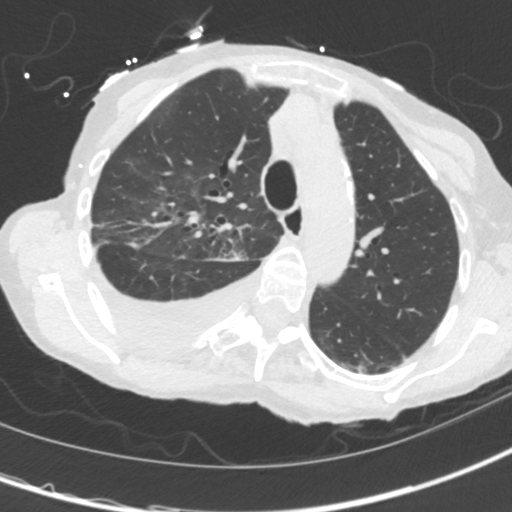
[im 103/134  lung]
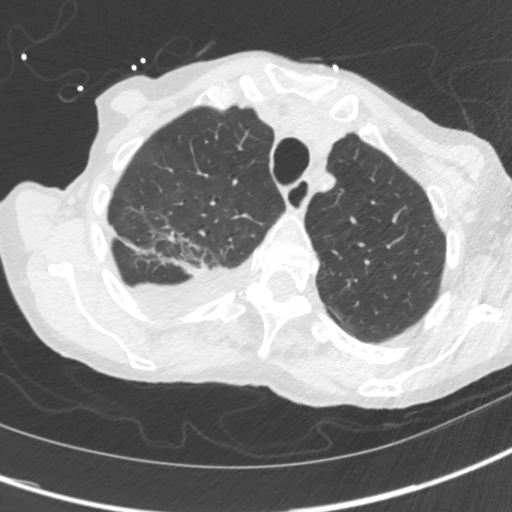
[im 113/134  lung]
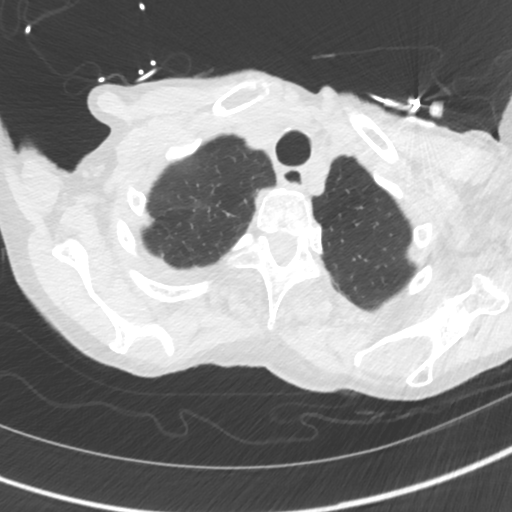
[im 123/134  lung]
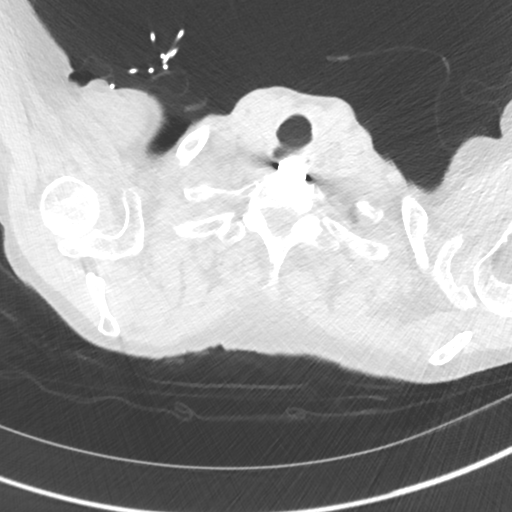

[Series 4: chest w/o 3mm st cor · coronal · non-contrast · 0.55mm/px · 3 of 93 slices shown]
[im 19/93  lung]
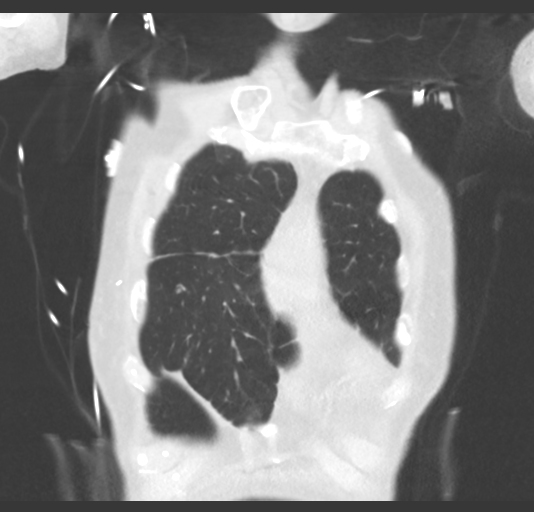
[im 37/93  lung]
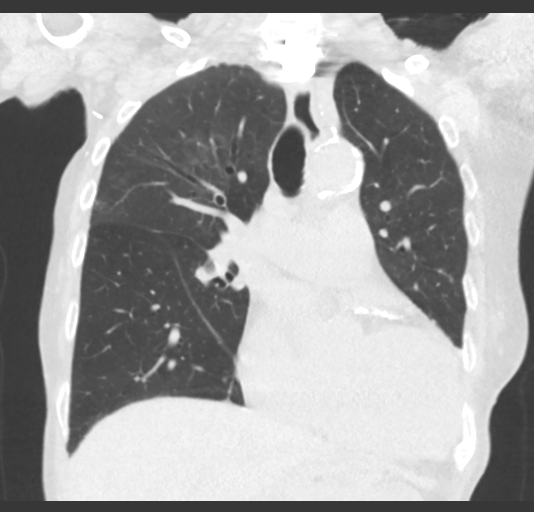
[im 56/93  lung]
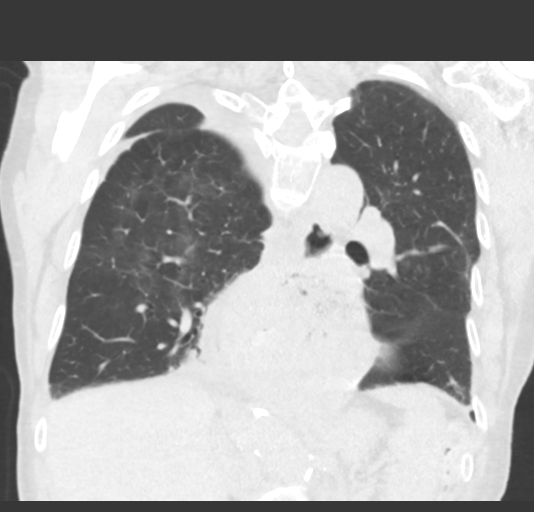

[15 of 36 positions shown; findings below may reference images not displayed]

FINDINGS: Cardiovascular: Heart is enlarged. Coronary artery calcification is
noted. Ascending thoracic aorta 4.7 cm diameter.

Mediastinum/Nodes: No mediastinal lymphadenopathy. No evidence for
gross hilar lymphadenopathy although assessment is limited by the
lack of intravenous contrast on today's study. The esophagus has
normal imaging features. Large hiatal hernia. There is no axillary
lymphadenopathy.

Lungs/Pleura: Moderate changes emphysema. Scattered areas of
bronchiectasis with interstitial scarring and volume loss. Posterior
right lower lobe collapse/consolidation. Small moderate right
pleural effusion. Lingular scarring noted.

Upper Abdomen: Unremarkable.

Musculoskeletal: Bone windows reveal no worrisome lytic or sclerotic
osseous lesions. Multiple thoracic compression fractures, some of
which have been treated.
IMPRESSION: 1. Moderate emphysema.
2. Scattered areas of bronchiectasis and interstitial
scarring/volume loss.
3. Small moderate right pleural effusion.
4. 4.7 cm ascending thoracic aortic aneurysm. Recommend semi-annual
imaging followup by CTA or MRA and referral to cardiothoracic
surgery if not already obtained. This recommendation follows 5797
ACCF/AHA/AATS/ACR/ASA/SCA/LEZAMA/PRETTY/KRISH/JHON JOO Guidelines for the
Diagnosis and Management of Patients With Thoracic Aortic Disease.
Circulation. 5797; 121: [id number].

## 2018-01-13 DIAGNOSIS — N814 Uterovaginal prolapse, unspecified: Secondary | ICD-10-CM | POA: Diagnosis not present

## 2018-01-13 DIAGNOSIS — N8111 Cystocele, midline: Secondary | ICD-10-CM | POA: Diagnosis not present

## 2018-01-13 NOTE — Progress Notes (Signed)
Cardiology Office Note    Date:  01/15/2018  ID:  [name], DOB 02-12-1936, MRN [id number] PCP:  [name], MD  Cardiologist:  Dr. Meda Coffee   Chief Complaint: f/u atrial fib  History of Present Illness:  [name] is a 82 y.o. female with history of osteoporosis, hyperlipidemia, hypertension, hypothyroidism, breast Ca (lumpectopy/ XRT 2012-3), DVT in 2012, GERD, 4.3cm ascending thoracic aneurysm (followed by Dr. Prescott Gum), anxiety, paroxysmal atrial fib, chronic diastolic CHF, pulm HTN, AI/MR/TR who presents for 6 month follow-up.   To recap hx, remote nuc in 2012 was wnl. She was admitted in 11/2016 with SOB and found to have rapid atrial fib in the setting of acute respiratory failure with hypoxia and PNA. She was also treated for presumed diastolic CHF with IV Lasix. 2D echo 11/28/16: mild LVH, EF 55-60%, mild-mod AI, mod MR, severe LAE, mod RAE, severe TR, PASP 67. 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 She spontaneously converted to NSR on diltiazem and flecainide. She was seen by Dr. Prescott Gum who did not feel she was a candidate for any aneurysm or valve surgery given her severe kyphosis. F/u nuc 01/2017 was poor quality study but low risk, EF 72%. Last labs 2018 showed normal CBC, K 3.9, Cr 0.74 06/2017.  She presents back for follow-up today overall feeling well from a cardiac standpoint. No CP, SOB, palpitations, edema, orthopnea. She complains of sensation of restless leg. PCP has her on a trial of iron but she hasn't seen much difference. There is no actual pain or claudication. No sores. Had 1 fall when trying to retrieve a piece of paper from a ditch in the rain but generally steady on her feet with a cane.   Past Medical History:  Diagnosis Date  . Anxiety   . Aortic regurgitation   . Breast  cancer ([place]) 08/20/2011   R breast DCIS, ER/PR +  . Cataract 3 and 10/92   bilateral  . Chronic diastolic CHF (congestive heart failure) ([place])   . Compression fracture of fourth lumbar vertebra (HCC)   . DVT (deep venous thrombosis) ([place]) 08/2011   LL extremity   . Fibromyalgia   . Fracture lumbar vertebra-closed ([place])   . Fracture of thoracic vertebra, closed ([place])   . GERD (gastroesophageal reflux disease)   . H/O hiatal hernia   . History of blood clots   . History of radiation therapy 01/2012   R breast  . Hypercholesteremia   . Hypertension    DR Orinda Kenner  . Hypothyroidism   . Mitral regurgitation    12/06/16 TEE: Moderate MR with a flail P2 segment that billows and thickened leaflets suggestive of form fruste variant of Barlow&'s disease  . Osteoporosis   . PAF (paroxysmal atrial fibrillation) ([place])    a. dx 11/2016.  . Rib fractures   . Thoracic ascending aortic aneurysm ([place])    a. followed by Dr. Prescott Gum.    Past Surgical History:  Procedure Laterality Date  . ANTERIOR CERVICAL DECOMP/DISCECTOMY FUSION N/A 09/09/2015   Procedure: Anterior Cervical Decompression and Fusion Cervical seven-Thorasic one ;  Surgeon: Erline Levine, MD;  Location: [facility];  Service: Neurosurgery;  Laterality: N/A;  . APPENDECTOMY  1940  . BREAST SURGERY    . CATARACT EXTRACTION  1992  . EYE SURGERY  [place]  . HERNIA REPAIR  10/16/2006   [facility] - Dr Hassell Done  . KYPHOPLASTY N/A 01/26/2013   Procedure: KYPHOPLASTY;  Surgeon: Kristeen Miss, MD;  Location: [facility];  Service: Neurosurgery;  Laterality: N/A;  T11 and L1  . KYPHOPLASTY N/A 06/21/2017   Procedure: Lumbar four Kyphoplasty;  Surgeon: Erline Levine, MD;  Location: [facility];  Service: Neurosurgery;  Laterality: N/A;  . MASTECTOMY, PARTIAL  10/17/2011   Procedure: MASTECTOMY PARTIAL;  Surgeon: Haywood Lasso, MD;  Location: [facility];  Service: General;  Laterality: Right;  needle guided  . TEE WITHOUT CARDIOVERSION N/A 12/04/2016    Procedure: TRANSESOPHAGEAL ECHOCARDIOGRAM (TEE);  Surgeon: Pixie Casino, MD;  [facility];  Service: Cardiovascular;  Laterality: N/A;  . TONSILLECTOMY  1944    Current Medications: Current Meds  Medication Sig  . apixaban (ELIQUIS) 5 MG TABS tablet Take 1 tablet (5 mg total) by mouth 2 (two) times daily. (Patient taking differently: Take 5 mg by mouth 2 (two) times daily. With breakfast & with supper)  . BONIVA 150 MG tablet Take 150 mg by mouth every 30 (thirty) days.   . calcium citrate-vitamin D (CITRACAL+D) 315-200 MG-UNIT per tablet Take 1 tablet by mouth 2 (two) times daily. (Patient taking differently: Take 1 tablet by mouth 2 (two) times daily. With breakfast & with supper)  . Camphor-Eucalyptus-Menthol (VICKS VAPORUB EX) Apply 1 application topically daily as needed (toenail fungus).   . cholecalciferol (VITAMIN D) 1000 UNITS tablet Take 1,000 Units by mouth daily with lunch. Vitamin D3  . conjugated estrogens (PREMARIN) vaginal cream Place 1 Applicatorful vaginally 2 (two) times a week.  . diltiazem (CARDIZEM CD) 180 MG 24 hr capsule Take 1 capsule (180 mg total) by mouth daily. (Patient taking differently: Take 180 mg by mouth daily with breakfast. )  . flecainide (TAMBOCOR) 50 MG tablet Take 1 tablet (50 mg total) by mouth every 12 (twelve) hours. (Patient taking differently: Take 50 mg by mouth every 12 (twelve) hours. With breakfast & at bedtime)  . furosemide (LASIX) 20 MG tablet Take 1 tablet (20 mg total) by mouth every other day. (Patient taking differently: Take 20 mg by mouth every other day. With breakfast)  . HYDROcodone-acetaminophen (NORCO/VICODIN) 5-325 MG tablet Take 1-2 tablets by mouth every 4 (four) hours as needed (mild pain). (Patient taking differently: Take 1-2 tablets by mouth every 4 (four) hours as needed (for back pain.). )  . iron polysaccharides (NIFEREX) 150 MG capsule Take 150 mg by mouth every other day.  . levothyroxine (SYNTHROID,  LEVOTHROID) 125 MCG tablet Take 125 mcg by mouth daily before breakfast.  . Meclizine HCl (BONINE PO) Take 1 tablet by mouth daily as needed (for ear/head stuffiness).   . Multiple Vitamin (MULTIVITAMIN) tablet Take 1 tablet by mouth daily with breakfast.   . omeprazole (PRILOSEC OTC) 20 MG tablet Take 20 mg by mouth daily before breakfast.   . pravastatin (PRAVACHOL) 20 MG tablet Take 20 mg by mouth daily with supper.   . pseudoephedrine (SUDAFED) 30 MG tablet Take 60 mg by mouth every 4 (four) hours as needed (for congestion/head or ear stuffiness).   . solifenacin (VESICARE) 5 MG tablet Take 5 mg by mouth daily with breakfast.    \  Allergies:   Contrast media [iodinated diagnostic agents]; Iohexol; Mucinex [guaifenesin er]; Zanaflex [tizanidine hcl]; Penicillins; and Robaxin [methocarbamol]   Social History   Socioeconomic History  . Marital status: Married    Spouse name: None  .  Number of children: None  . Years of education: None  . Highest education level: None  Social Needs  . Financial resource strain: None  . Food insecurity - worry: None  . Food insecurity - inability: None  . Transportation needs - medical: None  . Transportation needs - non-medical: None  Occupational History  . None  Tobacco Use  . Smoking status: Never Smoker  . Smokeless tobacco: Never Used  Substance and Sexual Activity  . Alcohol use: No  . Drug use: No  . Sexual activity: No  Other Topics Concern  . None  Social History Narrative  . None     Family History:  Family History  Problem Relation Age of Onset  . Heart disease Mother   . Heart disease Maternal Uncle   . Heart disease Maternal Grandfather     ROS:   Please see the history of present illness. \\ All other systems are reviewed and otherwise negative.    PHYSICAL EXAM:   VS:  BP 134/90   Pulse 68   Ht 4\' 11"  (1.499 m)   Wt 111 lb (50.3 kg)   BMI 22.42 kg/m   BMI: Body mass index is 22.42 kg/m. GEN: Thin elderly  frail appearing WF, in no acute distress  HEENT: normocephalic, atraumatic Neck: no JVD, carotid bruits, or masses Cardiac: RRR; no murmurs, rubs, or gallops, no edema, 1+ pedal pulses bilaterally Respiratory:  clear to auscultation bilaterally, normal work of breathing GI: soft, nontender, nondistended, + BS MS: no deformity or atrophy  Skin: warm and dry, no rash Neuro:  Alert and Oriented x 3, Strength and sensation are intact, follows commands Psych: euthymic mood, full affect  Wt Readings from Last 3 Encounters:  01/15/18 111 lb (50.3 kg)  07/18/17 104 lb (47.2 kg)  06/21/17 108 lb 0.4 oz (49 kg)      Studies/Labs Reviewed:   EKG:  EKG was ordered today and personally reviewed by me and demonstrates NAE 68bpm first degree AVB with one PVC, possible LAE, QRS 108s, QTc 420ms. Slight lengthening of QRS and QTc compared to prior.  Recent Labs: 06/21/2017: BUN 19; Creatinine, Ser 0.74; Hemoglobin 14.2; Platelets 192; Potassium 3.9; Sodium 140   Lipid Panel   Additional studies/ records that were reviewed today include: Summarized above.    ASSESSMENT & PLAN:   1. Paroxysmal atrial fib - maintaining NSR on current regimen. It appears the only episode of afib she had that we know of was in 11/2016 at which time the flecainide was started. Today's EKG shows mild progression of her QRS and QTc intervals compared to prior values (QRS still <120 and QTc still < 500 though). I will check lytes and plan to review long-term plans for flecainide with Dr. Meda Coffee. Will update screening BMET, Mg, CBC given chronic anticoagulation use as well as restless leg sensation. Also discussed avoidance of Sudafed and talking to PCP about alternatives given her h/o afib - she previously took this for disequilibrium infrequently. She has had 1 fall in the last year but believes this was due to the rain. We discussed fall precautions. Will need to reassess fall risk at each visit given chronic  anticoagulation. 2. Mitral regurgitation - per Dr. Newt Minion. Prescott Gum, not felt to be a surgical candidate. Continue to monitor clinically. Volume status appears stable. 3. Chronic diastolic CHF - volume status appears stable. Recheck BMET. 4. Essential HTN - running slightly above normal value but given age, chronic back issues, and prior  h/o disequilibrium I would not press control further. 5. Ascending aortic aneurysm - monitored clinically by Dr. Prescott Gum. Pt reports she will plan to see him in May.  Disposition: F/u with Dr. Meda Coffee in 6 months.   Medication Adjustments/Labs and Tests Ordered: Current medicines are reviewed at length with the patient today.  Concerns regarding medicines are outlined above. Medication changes, Labs and Tests ordered today are summarized above and listed in the Patient Instructions accessible in Encounters.   Signed, Charlie Pitter, PA-C  01/15/2018 10:07 AM    [address], [place], [place]  [address] Phone: [phone]; Fax: [phone]

## 2018-01-15 ENCOUNTER — Ambulatory Visit (INDEPENDENT_AMBULATORY_CARE_PROVIDER_SITE_OTHER): Payer: Medicare Other | Admitting: Physician Assistant

## 2018-01-15 ENCOUNTER — Encounter: Payer: Self-pay | Admitting: Physician Assistant

## 2018-01-15 VITALS — BP 134/90 | HR 68 | Ht 59.0 in | Wt 111.0 lb

## 2018-01-15 DIAGNOSIS — I10 Essential (primary) hypertension: Secondary | ICD-10-CM

## 2018-01-15 DIAGNOSIS — I712 Thoracic aortic aneurysm, without rupture, unspecified: Secondary | ICD-10-CM

## 2018-01-15 DIAGNOSIS — I5032 Chronic diastolic (congestive) heart failure: Secondary | ICD-10-CM | POA: Diagnosis not present

## 2018-01-15 DIAGNOSIS — I480 Paroxysmal atrial fibrillation: Secondary | ICD-10-CM | POA: Diagnosis not present

## 2018-01-15 DIAGNOSIS — I340 Nonrheumatic mitral (valve) insufficiency: Secondary | ICD-10-CM

## 2018-01-15 LAB — BASIC METABOLIC PANEL
BUN / CREAT RATIO: 44 — AB (ref 12–28)
BUN: 33 mg/dL — AB (ref 8–27)
CALCIUM: 9.6 mg/dL (ref 8.7–10.3)
CO2: 27 mmol/L (ref 20–29)
CREATININE: 0.75 mg/dL (ref 0.57–1.00)
Chloride: 97 mmol/L (ref 96–106)
GFR calc non Af Amer: 76 mL/min/{1.73_m2} (ref >59)
GFR, EST AFRICAN AMERICAN: 88 mL/min/{1.73_m2} (ref >59)
GLUCOSE: 96 mg/dL (ref 65–99)
Potassium: 4.5 mmol/L (ref 3.5–5.2)
Sodium: 139 mmol/L (ref 134–144)

## 2018-01-15 LAB — CBC
HEMATOCRIT: 41.4 % (ref 34.0–46.6)
HEMOGLOBIN: 14.5 g/dL (ref 11.1–15.9)
MCH: 30.3 pg (ref 26.6–33.0)
MCHC: 35 g/dL (ref 31.5–35.7)
MCV: 87 fL (ref 79–97)
Platelets: 180 10*3/uL (ref 150–379)
RBC: 4.78 x10E6/uL (ref 3.77–5.28)
RDW: 14.9 % (ref 12.3–15.4)
WBC: 7.6 10*3/uL (ref 3.4–10.8)

## 2018-01-15 LAB — MAGNESIUM: Magnesium: 2.3 mg/dL (ref 1.6–2.3)

## 2018-01-15 NOTE — Patient Instructions (Addendum)
Medication Instructions:  DON'T TAKE ANYMORE SUDAFED  Labwork: TODAY:  BMET, CBC, & MAGNESIUM  Testing/Procedures:   Follow-Up: Your physician wants you to follow-up in: 6 MONTHS WITH DR. Johann Capers will receive a reminder letter in the mail two months in advance. If you don't receive a letter, please call our office to schedule the follow-up appointment.    Any Other Special Instructions Will Be Listed Below (If Applicable).     If you need a refill on your cardiac medications before your next appointment, please call your pharmacy.

## 2018-01-17 ENCOUNTER — Other Ambulatory Visit: Payer: Self-pay | Admitting: *Deleted

## 2018-01-17 MED ORDER — FUROSEMIDE 20 MG PO TABS: ORAL_TABLET | ORAL | 3 refills | Status: DC

## 2018-03-07 DIAGNOSIS — Z0000 Encounter for general adult medical examination without abnormal findings: Secondary | ICD-10-CM | POA: Diagnosis not present

## 2018-03-07 DIAGNOSIS — K219 Gastro-esophageal reflux disease without esophagitis: Secondary | ICD-10-CM | POA: Diagnosis not present

## 2018-03-07 DIAGNOSIS — I10 Essential (primary) hypertension: Secondary | ICD-10-CM | POA: Diagnosis not present

## 2018-03-07 DIAGNOSIS — I712 Thoracic aortic aneurysm, without rupture: Secondary | ICD-10-CM | POA: Diagnosis not present

## 2018-03-07 DIAGNOSIS — D509 Iron deficiency anemia, unspecified: Secondary | ICD-10-CM | POA: Diagnosis not present

## 2018-03-07 DIAGNOSIS — M797 Fibromyalgia: Secondary | ICD-10-CM | POA: Diagnosis not present

## 2018-03-07 DIAGNOSIS — S22000A Wedge compression fracture of unspecified thoracic vertebra, initial encounter for closed fracture: Secondary | ICD-10-CM | POA: Diagnosis not present

## 2018-03-07 DIAGNOSIS — Z1389 Encounter for screening for other disorder: Secondary | ICD-10-CM | POA: Diagnosis not present

## 2018-03-07 DIAGNOSIS — I480 Paroxysmal atrial fibrillation: Secondary | ICD-10-CM | POA: Diagnosis not present

## 2018-03-07 DIAGNOSIS — M810 Age-related osteoporosis without current pathological fracture: Secondary | ICD-10-CM | POA: Diagnosis not present

## 2018-03-07 DIAGNOSIS — M5030 Other cervical disc degeneration, unspecified cervical region: Secondary | ICD-10-CM | POA: Diagnosis not present

## 2018-03-07 DIAGNOSIS — E782 Mixed hyperlipidemia: Secondary | ICD-10-CM | POA: Diagnosis not present

## 2018-03-07 DIAGNOSIS — E039 Hypothyroidism, unspecified: Secondary | ICD-10-CM | POA: Diagnosis not present

## 2018-03-13 ENCOUNTER — Other Ambulatory Visit: Payer: Self-pay | Admitting: Cardiology

## 2018-03-20 DIAGNOSIS — N8111 Cystocele, midline: Secondary | ICD-10-CM | POA: Diagnosis not present

## 2018-03-20 DIAGNOSIS — N814 Uterovaginal prolapse, unspecified: Secondary | ICD-10-CM | POA: Diagnosis not present

## 2018-03-25 ENCOUNTER — Ambulatory Visit: Payer: Medicare Other | Admitting: Cardiology

## 2018-03-31 ENCOUNTER — Other Ambulatory Visit: Payer: Self-pay | Admitting: Physician Assistant

## 2018-03-31 ENCOUNTER — Other Ambulatory Visit: Payer: Self-pay | Admitting: Cardiology

## 2018-03-31 DIAGNOSIS — N3281 Overactive bladder: Secondary | ICD-10-CM | POA: Diagnosis not present

## 2018-04-01 ENCOUNTER — Other Ambulatory Visit: Payer: Self-pay | Admitting: Cardiothoracic Surgery

## 2018-04-01 DIAGNOSIS — I712 Thoracic aortic aneurysm, without rupture: Secondary | ICD-10-CM

## 2018-04-01 DIAGNOSIS — I7121 Aneurysm of the ascending aorta, without rupture: Secondary | ICD-10-CM

## 2018-04-01 MED ORDER — FUROSEMIDE 20 MG PO TABS
ORAL_TABLET | ORAL | 5 refills | Status: DC
Start: 2018-04-01 — End: 2018-04-02

## 2018-04-01 NOTE — Telephone Encounter (Signed)
Follow up     *STAT* If patient is at the pharmacy, call can be transferred to refill team.   1. Which medications need to be refilled? (please list name of each medication and dose if known) flecainide (TAMBOCOR) 50 MG tablet and Lasix  2. Which pharmacy/location (including street and city if local pharmacy) is medication to be sent to? [facility] 3. Do they need a 30 day or 90 day supply? [name]

## 2018-04-02 ENCOUNTER — Encounter: Payer: Medicare Other | Admitting: Cardiothoracic Surgery

## 2018-04-02 MED ORDER — FUROSEMIDE 20 MG PO TABS: ORAL_TABLET | ORAL | 2 refills | Status: DC

## 2018-04-02 NOTE — Addendum Note (Signed)
Addended by: Juventino Slovak on: 04/02/2018 10:03 AM   Modules accepted: Orders

## 2018-04-09 ENCOUNTER — Other Ambulatory Visit: Payer: Self-pay | Admitting: Cardiology

## 2018-04-09 MED ORDER — APIXABAN 2.5 MG PO TABS: 2.5000 mg | ORAL_TABLET | Freq: Two times a day (BID) | ORAL | 10 refills | Status: DC

## 2018-04-09 NOTE — Telephone Encounter (Signed)
Pt saw PA on 01/15/18 for OV, Dr Meda Coffee is pts Cardiologist. Age is 82 yrs old.  Weight was 50.3Kg at the time of that visit. Serum creatine check on that visit and was 0.75. Pt meets the criteria to be on Eliquis 2.5 mg BID since she turned 82 yrs old in March. Thus, telephoned her and discussed this change and she understands. She has a weeks worth of 5mg  and will cut tablet in 1/2 and take it BID then pick up the 2.5mg  tablet and take 1 tablet BID. Pt verbalizes understanding.

## 2018-04-14 DIAGNOSIS — H31002 Unspecified chorioretinal scars, left eye: Secondary | ICD-10-CM | POA: Diagnosis not present

## 2018-04-14 DIAGNOSIS — H52201 Unspecified astigmatism, right eye: Secondary | ICD-10-CM | POA: Diagnosis not present

## 2018-04-14 DIAGNOSIS — Z961 Presence of intraocular lens: Secondary | ICD-10-CM | POA: Diagnosis not present

## 2018-04-22 ENCOUNTER — Other Ambulatory Visit: Payer: Self-pay | Admitting: Cardiothoracic Surgery

## 2018-04-22 DIAGNOSIS — I7121 Aneurysm of the ascending aorta, without rupture: Secondary | ICD-10-CM

## 2018-04-22 DIAGNOSIS — I712 Thoracic aortic aneurysm, without rupture: Secondary | ICD-10-CM

## 2018-04-23 ENCOUNTER — Encounter: Payer: Self-pay | Admitting: Cardiothoracic Surgery

## 2018-04-23 ENCOUNTER — Other Ambulatory Visit: Payer: Self-pay

## 2018-04-23 ENCOUNTER — Ambulatory Visit (INDEPENDENT_AMBULATORY_CARE_PROVIDER_SITE_OTHER): Payer: Medicare Other | Admitting: Cardiothoracic Surgery

## 2018-04-23 ENCOUNTER — Ambulatory Visit
Admission: RE | Admit: 2018-04-23 | Discharge: 2018-04-23 | Disposition: A | Discharge status: Home / Self Care | Payer: Medicare Other | Source: Ambulatory Visit | Attending: Cardiothoracic Surgery | Admitting: Cardiothoracic Surgery

## 2018-04-23 VITALS — BP 148/80 | HR 74 | Resp 16 | Ht 59.0 in | Wt 110.0 lb

## 2018-04-23 DIAGNOSIS — I712 Thoracic aortic aneurysm, without rupture: Secondary | ICD-10-CM | POA: Diagnosis not present

## 2018-04-23 DIAGNOSIS — I7121 Aneurysm of the ascending aorta, without rupture: Secondary | ICD-10-CM

## 2018-04-23 DIAGNOSIS — I509 Heart failure, unspecified: Secondary | ICD-10-CM

## 2018-04-23 NOTE — Progress Notes (Signed)
PCP is [name], MD Referring Provider is [name], MD  Chief Complaint  Patient presents with  . Thoracic Aortic Aneurysm    1 year f/u with CXR and BP check    HPI: One year follow-up with chest x-ray for mild-moderate fusiform ascending aneurysm diameter 4.2 cm unchanged for several years.  And remains asymptomatic.  Patient's blood pressure has been under good control.  Most recently she has had a compression fracture of the lumbar vertebral body treated with balloon kyphoplasty and recently discharged.  She is still taking hydrocodone for pain and I attribute that to her mild increase in blood pressure.  She denies any chest pain or pressure.  She is maintaining sinus rhythm.  She has chronic moderate MR managed medically.  Patient is now 82 years old with severe kyphosis, frail, and is not a candidate for any type of cardiac surgery.  Controlling blood pressure will be important to prevent further dilatation of the aorta or further worsening of mitral regurgitation.  She is currently taking diltiazem for her atrial fibrillation and blood pressure control but might benefit from losartan in the future if blood pressure continues to be above 132 systolic.  Past Medical History:  Diagnosis Date  . Anxiety   . Aortic regurgitation   . Breast cancer ([place]) 08/20/2011   R breast DCIS, ER/PR +  . Cataract 3 and 10/92   bilateral  . Chronic diastolic CHF (congestive heart failure) ([place])   . Compression fracture of fourth lumbar vertebra (HCC)   . DVT (deep venous thrombosis) ([place]) 08/2011   LL extremity   . Fibromyalgia   . Fracture lumbar vertebra-closed ([place])   . Fracture of thoracic vertebra, closed ([place])   . GERD (gastroesophageal reflux disease)   . H/O hiatal hernia   . History of blood clots   . History of radiation therapy 01/2012   R breast  . Hypercholesteremia   . Hypertension    DR Orinda Kenner  . Hypothyroidism   . Mitral regurgitation    12/06/16 TEE: Moderate MR  with a flail P2 segment that billows and thickened leaflets suggestive of form fruste variant of Barlow&'s disease  . Osteoporosis   . PAF (paroxysmal atrial fibrillation) ([place])    a. dx 11/2016.  . Rib fractures   . Thoracic ascending aortic aneurysm ([place])    a. followed by Dr. Prescott Gum.    Past Surgical History:  Procedure Laterality Date  . ANTERIOR CERVICAL DECOMP/DISCECTOMY FUSION N/A 09/09/2015   Procedure: Anterior Cervical Decompression and Fusion Cervical seven-Thorasic one ;  Surgeon: Erline Levine, MD;  Location: [facility]UR[facility]S;  Service: Neurosurgery;  Laterality: N/A;  . APPENDECTOMY  1940  . BREAST SURGERY    . CATARACT EXTRACTION  1992  . EYE SURGERY  1940, 1956  . HERNIA REPAIR  10/16/2006   [facility] - Dr Hassell Done  . KYPHOPLASTY N/A 01/26/2013   Procedure: KYPHOPLASTY;  Surgeon: Kristeen Miss, MD;  Location: [facility];  Service: Neurosurgery;  Laterality: N/A;  T11 and L1  . KYPHOPLASTY N/A 06/21/2017   Procedure: Lumbar four Kyphoplasty;  Surgeon: Erline Levine, MD;  Location: [facility];  Service: Neurosurgery;  Laterality: N/A;  . MASTECTOMY, PARTIAL  10/17/2011   Procedure: MASTECTOMY PARTIAL;  Surgeon: Haywood Lasso, MD;  Location: [facility];  Service: General;  Laterality: Right;  needle guided  . TEE WITHOUT CARDIOVERSION N/A 12/04/2016   Procedure: TRANSESOPHAGEAL ECHOCARDIOGRAM (TEE);  Surgeon: Pixie Casino, MD;  Location: [facility]  ENDOSCOPY;  Service: Cardiovascular;  Laterality: N/A;  . TONSILLECTOMY  1944    Family History  Problem Relation Age of Onset  . Heart disease Mother   . Heart disease Maternal Uncle   . Heart disease Maternal Grandfather     Social History Social History   Tobacco Use  . Smoking status: Never Smoker  . Smokeless tobacco: Never Used  Substance Use Topics  . Alcohol use: No  . Drug use: No    Current Outpatient Medications  Medication Sig Dispense Refill  . apixaban (ELIQUIS) 2.5 MG TABS tablet Take 1 tablet (2.5 mg total) by mouth 2  (two) times daily. 60 tablet 10  . BONIVA 150 MG tablet Take 150 mg by mouth every 30 (thirty) days.     . calcium citrate-vitamin D (CITRACAL+D) 315-200 MG-UNIT per tablet Take 1 tablet by mouth 2 (two) times daily. (Patient taking differently: Take 1 tablet by mouth 2 (two) times daily. With breakfast & with supper) 60 tablet 11  . Camphor-Eucalyptus-Menthol (VICKS VAPORUB EX) Apply 1 application topically daily as needed (toenail fungus).     . cholecalciferol (VITAMIN D) 1000 UNITS tablet Take 1,000 Units by mouth daily with lunch. Vitamin D3    . conjugated estrogens (PREMARIN) vaginal cream Place 1 Applicatorful vaginally 2 (two) times a week.    . diltiazem (CARDIZEM CD) 180 MG 24 hr capsule Take 1 capsule (180 mg total) by mouth daily. 30 capsule 9  . flecainide (TAMBOCOR) 50 MG tablet TAKE ONE TABLET EVERY TWELVE HOURS. 60 tablet 5  . furosemide (LASIX) 20 MG tablet Take one tablet by mouth 3 times per week 45 tablet 2  . HYDROcodone-acetaminophen (NORCO/VICODIN) 5-325 MG tablet Take 1-2 tablets by mouth every 4 (four) hours as needed (mild pain). (Patient taking differently: Take 1-2 tablets by mouth every 4 (four) hours as needed (for back pain.). ) 30 tablet 0  . iron polysaccharides (NIFEREX) 150 MG capsule Take 150 mg by mouth 3 (three) times a week.     . levothyroxine (SYNTHROID, LEVOTHROID) 125 MCG tablet Take 125 mcg by mouth daily before breakfast.    . Multiple Vitamin (MULTIVITAMIN) tablet Take 1 tablet by mouth daily with breakfast.     . omeprazole (PRILOSEC OTC) 20 MG tablet Take 20 mg by mouth daily before breakfast.     . pravastatin (PRAVACHOL) 20 MG tablet Take 20 mg by mouth daily with supper.     . solifenacin (VESICARE) 5 MG tablet Take 5 mg by mouth daily with breakfast.      No current facility-administered medications for this visit.     Allergies  Allergen Reactions  . Contrast Media [Iodinated Diagnostic Agents] Shortness Of Breath  . Iohexol Shortness Of  Breath  . Mucinex [Guaifenesin Er] Other (See Comments)    DIZZINESS  . Zanaflex [Tizanidine Hcl] Anxiety and Other (See Comments)    Dizziness  . Penicillins Rash    Has patient had a PCN reaction causing immediate rash, facial/tongue/throat swelling, SOB or lightheadedness with hypotension: No Has patient had a PCN reaction causing severe rash involving mucus membranes or skin necrosis: No Has patient had a PCN reaction that required hospitalization No Has patient had a PCN reaction occurring within the last 10 years: No If all of the above answers are "NO", then may proceed with Cephalosporin use.   . Robaxin [Methocarbamol] Diarrhea    Review of Systems  No chest pain No bleeding complications from Eliquis Feels somewhat groggy in the  morning until she drinks her Coca-Cola with caffeine for lunch  BP (!) 148/80 (BP Location: Left Arm, Patient Position: Sitting, Cuff Size: Normal)   Pulse 74   Resp 16   Ht 4\' 11"  (1.499 m)   Wt 110 lb (49.9 kg)   SpO2 98% Comment: RA  BMI 22.22 kg/m  Physical Exam      Exam    General- alert and comfortable but frail with severe kyphosis    Neck- no JVD, no cervical adenopathy palpable, no carotid bruit   Lungs- clear without rales, wheezes   Cor- regular rate and rhythm, 2/6 systolic mitral murmur , no gallop   Abdomen- soft, non-tender   Extremities - warm, non-tender, minimal edema   Neuro- oriented, appropriate, no focal weakness   Diagnostic Tests: Chest x-ray appears unchanged with normal cardiac silhouette, no pleural effusion or edema, severe kyphosis  Impression: Mild-moderate ascending fusiform aneurysm stable for several years.  Patient not a candidate for cardiac surgery.  Best therapy is to keep blood pressure controlled   Plan: Return with chest x-ray next year.  I have recommended that she check her blood pressure on a regular basis with a digital system and keep records for her primary care physician Dr.  Laurann Montana.   Len Childs, MD [facility] [phone]

## 2018-05-19 DIAGNOSIS — Z85828 Personal history of other malignant neoplasm of skin: Secondary | ICD-10-CM | POA: Diagnosis not present

## 2018-05-19 DIAGNOSIS — L720 Epidermal cyst: Secondary | ICD-10-CM | POA: Diagnosis not present

## 2018-05-19 DIAGNOSIS — D692 Other nonthrombocytopenic purpura: Secondary | ICD-10-CM | POA: Diagnosis not present

## 2018-05-19 DIAGNOSIS — L821 Other seborrheic keratosis: Secondary | ICD-10-CM | POA: Diagnosis not present

## 2018-05-19 DIAGNOSIS — L820 Inflamed seborrheic keratosis: Secondary | ICD-10-CM | POA: Diagnosis not present

## 2018-06-19 DIAGNOSIS — N814 Uterovaginal prolapse, unspecified: Secondary | ICD-10-CM | POA: Diagnosis not present

## 2018-06-19 DIAGNOSIS — N952 Postmenopausal atrophic vaginitis: Secondary | ICD-10-CM | POA: Diagnosis not present

## 2018-06-19 DIAGNOSIS — N8111 Cystocele, midline: Secondary | ICD-10-CM | POA: Diagnosis not present

## 2018-07-16 ENCOUNTER — Other Ambulatory Visit: Payer: Self-pay | Admitting: Internal Medicine

## 2018-07-16 ENCOUNTER — Ambulatory Visit
Admission: RE | Admit: 2018-07-16 | Discharge: 2018-07-16 | Disposition: A | Discharge status: Home / Self Care | Payer: Medicare Other | Source: Ambulatory Visit | Attending: Internal Medicine | Admitting: Internal Medicine

## 2018-07-16 DIAGNOSIS — R0781 Pleurodynia: Secondary | ICD-10-CM | POA: Diagnosis not present

## 2018-07-16 DIAGNOSIS — M25551 Pain in right hip: Secondary | ICD-10-CM

## 2018-07-16 DIAGNOSIS — S2242XA Multiple fractures of ribs, left side, initial encounter for closed fracture: Secondary | ICD-10-CM | POA: Diagnosis not present

## 2018-07-16 DIAGNOSIS — S2239XA Fracture of one rib, unspecified side, initial encounter for closed fracture: Secondary | ICD-10-CM | POA: Diagnosis not present

## 2018-07-29 NOTE — Progress Notes (Addendum)
Cardiology Office Note    Date:  07/30/2018  ID:  [name], DOB November 29, 1935, MRN [id number] PCP:  [name], MD  Cardiologist:  Ena Dawley, MD   Chief Complaint: 6 month f/u afib  History of Present Illness:  [name] is a 82 y.o. female with history of osteoporosis, hyperlipidemia, hypertension, hypothyroidism, breast Ca (lumpectopy/ XRT 2012-3), DVT in 2012, GERD, 4.7cm ascending thoracic aneurysm (followed by Dr. Prescott Gum), anxiety, paroxysmal atrial fib, chronic diastolic CHF, pulm HTN, AI/MR/TR who presents for 6 month follow-up.   To recap hx, remote nuc in 2012 was wnl. She was admitted in 11/2016 with SOB and found to have rapid atrial fib in the setting of acute respiratory failure with hypoxia and PNA. She was also treated for presumed diastolic CHF with IV Lasix. 2D echo 11/28/16: mild LVH, EF 55-60%, mild-mod AI, mod MR, severe LAE, mod RAE, severe TR, PASP 67. 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 She spontaneously converted to NSR on diltiazem and flecainide. She was seen by Dr. Prescott Gum who did not feel she was a candidate for any aneurysm or valve surgery given her severe kyphosis. F/u nuc 01/2017 was poor quality study but low risk, EF 72%. At last OV I had reviewed plan for flecainide with Dr. Newt Minion. Allred and recommendation was to continue since she was fairly stable on this regimen. She saw Dr. Prescott Gum 04/2018 for her TAA and is to continue yearly surveillance with CXR next year. Last labs 01/2018 showed K 4.5, BUN 33, Cr 0.75, CBC wnl, Mg 2.3.  She returns for follow-up today overall feeling stable. As in prior notes she continues to notice poor energy especially in the morning. She does not drink any kind of caffeine and states she has a hard time getting the energy  to get up moving in the morning, but by the afternoon feels fine. She does not engage in any social activities or hobbies outside the home. She denies any CP, SOB, orthopnea, palpitations or edema.    Past Medical History:  Diagnosis Date  . Anxiety   . Aortic regurgitation   . Breast cancer ([place]) 08/20/2011   R breast DCIS, ER/PR +  . Cataract 3 and 10/92   bilateral  . Chronic diastolic CHF (congestive heart failure) ([place])   . Compression fracture of fourth lumbar vertebra ([facility])   . DVT (deep venous thrombosis) ([place]) 08/2011   LL extremity   . Fibromyalgia   . Fracture lumbar vertebra-closed ([place])   . Fracture of thoracic vertebra, closed ([place])   . GERD (gastroesophageal reflux disease)   . H/O hiatal hernia   . History of blood clots   . History of radiation therapy 01/2012   R breast  . Hypercholesteremia   . Hypertension    DR Orinda Kenner  . Hypothyroidism   . Mitral regurgitation    12/06/16 TEE: Moderate MR with a flail P2 segment that billows and thickened leaflets suggestive of form fruste variant of Barlow&'s disease  . Osteoporosis   . PAF (paroxysmal atrial fibrillation) ([place])    a. dx 11/2016.  . Rib fractures   . Thoracic ascending aortic aneurysm ([place])    a. followed by Dr. Prescott Gum.    Past Surgical History:  Procedure Laterality Date  . ANTERIOR  CERVICAL DECOMP/DISCECTOMY FUSION N/A 09/09/2015   Procedure: Anterior Cervical Decompression and Fusion Cervical seven-Thorasic one ;  Surgeon: Erline Levine, MD;  Location: [facility];  Service: Neurosurgery;  Laterality: N/A;  . APPENDECTOMY  1940  . BREAST SURGERY    . CATARACT EXTRACTION  1992  . EYE SURGERY  1940, 1956  . HERNIA REPAIR  10/16/2006   [facility] - Dr Hassell Done  . KYPHOPLASTY N/A 01/26/2013   Procedure: KYPHOPLASTY;  Surgeon: Kristeen Miss, MD;  Location: [facility];  Service: Neurosurgery;  Laterality: N/A;  T11 and L1  . KYPHOPLASTY N/A 06/21/2017   Procedure: Lumbar four Kyphoplasty;  Surgeon: Erline Levine, MD;  Location: [facility];  Service: Neurosurgery;  Laterality: N/A;  . MASTECTOMY, PARTIAL  10/17/2011   Procedure: MASTECTOMY PARTIAL;  Surgeon: Haywood Lasso, MD;  Location: [facility];  Service: General;  Laterality: Right;  needle guided  . TEE WITHOUT CARDIOVERSION N/A 12/04/2016   Procedure: TRANSESOPHAGEAL ECHOCARDIOGRAM (TEE);  Surgeon: Pixie Casino, MD;  Location[facility];  Service: Cardiovascular;  Laterality: N/A;  . TONSILLECTOMY  1944    Current Medications: Current Meds  Medication Sig  . apixaban (ELIQUIS) 2.5 MG TABS tablet Take 1 tablet (2.5 mg total) by mouth 2 (two) times daily.  Marland Kitchen BONIVA 150 MG tablet Take 150 mg by mouth every 30 (thirty) days.   . calcium citrate-vitamin D (CITRACAL+D) 315-200 MG-UNIT per tablet Take 1 tablet by mouth 2 (two) times daily. (Patient taking differently: Take 1 tablet by mouth 2 (two) times daily. With breakfast & with supper)  . Camphor-Eucalyptus-Menthol (VICKS VAPORUB EX) Apply 1 application topically daily as needed (toenail fungus).   . cholecalciferol (VITAMIN D) 1000 UNITS tablet Take 1,000 Units by mouth daily with lunch. Vitamin D3  . conjugated estrogens (PREMARIN) vaginal cream Place 1 Applicatorful vaginally 2 (two) times a week.  . diltiazem (CARDIZEM CD) 180 MG 24 hr capsule Take 1 capsule (180 mg total) by mouth daily.  . flecainide (TAMBOCOR) 50 MG tablet TAKE ONE TABLET EVERY TWELVE HOURS.  . furosemide (LASIX) 20 MG tablet Take one tablet by mouth 3 times per week  . HYDROcodone-acetaminophen (NORCO/VICODIN) 5-325 MG tablet Take 1-2 tablets by mouth every 4 (four) hours as needed (mild pain). (Patient taking differently: Take 1-2 tablets by mouth every 4 (four) hours as needed (for back pain.). )  . iron polysaccharides (NIFEREX) 150 MG capsule Take 150 mg by mouth 3 (three) times a week.   . levothyroxine (SYNTHROID, LEVOTHROID) 125 MCG tablet Take 125 mcg by mouth daily before breakfast.  . Multiple Vitamin  (MULTIVITAMIN) tablet Take 1 tablet by mouth daily with breakfast.   . omeprazole (PRILOSEC OTC) 20 MG tablet Take 20 mg by mouth daily before breakfast.   . pravastatin (PRAVACHOL) 20 MG tablet Take 20 mg by mouth daily with supper.   . solifenacin (VESICARE) 5 MG tablet Take 5 mg by mouth daily with breakfast.       Allergies:   Contrast media [iodinated diagnostic agents]; Iohexol; Mucinex [guaifenesin er]; Zanaflex [tizanidine hcl]; Penicillins; and Robaxin [methocarbamol]   Social History   Socioeconomic History  . Marital status: Married    Spouse name: Not on file  . Number of children: Not on file  . Years of education: Not on file  . Highest education level: Not on file  Occupational History  . Not on file  Social Needs  . Financial resource strain: Not on file  . Food insecurity:  Worry: Not on file    Inability: Not on file  . Transportation needs:    Medical: Not on file    Non-medical: Not on file  Tobacco Use  . Smoking status: Never Smoker  . Smokeless tobacco: Never Used  Substance and Sexual Activity  . Alcohol use: No  . Drug use: No  . Sexual activity: Never  Lifestyle  . Physical activity:    Days per week: Not on file    Minutes per session: Not on file  . Stress: Not on file  Relationships  . Social connections:    Talks on phone: Not on file    Gets together: Not on file    Attends religious service: Not on file    Active member of club or organization: Not on file    Attends meetings of clubs or organizations: Not on file    Relationship status: Not on file  Other Topics Concern  . Not on file  Social History Narrative  . Not on file     Family History:  The patient's family history includes Heart disease in her maternal grandfather, maternal uncle, and mother.  ROS:   Please see the history of present illness.  All other systems are reviewed and otherwise negative.    PHYSICAL EXAM:   VS:  BP (!) 142/88   Pulse 70   Ht 4\' 11"   (1.499 m)   Wt 111 lb 3.2 oz (50.4 kg)   SpO2 96%   BMI 22.46 kg/m   BMI: Body mass index is 22.46 kg/m. GEN: Thin frail appearing WF, in no acute distress HEENT: normocephalic, atraumatic. Strabismus noted Neck: no JVD, carotid bruits, or masses Cardiac: RRR; soft holosystolic SEM at axillary line. No rubs or gallops, no edema  Respiratory:  clear to auscultation bilaterally, normal work of breathing GI: soft, nontender, nondistended, + BS MS: severe kyphosis noted Skin: warm and dry, no rash Neuro:  Alert and Oriented x 3, Strength and sensation are intact, follows commands Psych: euthymic mood, full affect  Wt Readings from Last 3 Encounters:  07/30/18 111 lb 3.2 oz (50.4 kg)  04/23/18 110 lb (49.9 kg)  01/15/18 111 lb (50.3 kg)      Studies/Labs Reviewed:   EKG:  EKG was ordered today and personally reviewed by me and demonstrates NSR 70bpm, first degree AV block, LAFB, poor R wave progression without significant change from prior. QTc stable 479ms. QRS 12ms (stable).   Recent Labs: 01/15/2018: BUN 33; Creatinine, Ser 0.75; Hemoglobin 14.5; Magnesium 2.3; Platelets 180; Potassium 4.5; Sodium 139   Lipid Panel    Component Value Date/Time   CHOL  12/02/2010 0400    149        ATP III CLASSIFICATION:  <200     mg/dL   Desirable  200-239  mg/dL   Borderline High  >=240    mg/dL   High          TRIG 54 12/02/2010 0400   HDL 58 12/02/2010 0400   CHOLHDL 2.6 12/02/2010 0400   VLDL 11 12/02/2010 0400   LDLCALC  12/02/2010 0400    80        Total Cholesterol/HDL:CHD Risk Coronary Heart Disease Risk Table                     Men   Women  1/2 Average Risk   3.4   3.3  Average Risk       5.0  4.4  2 X Average Risk   9.6   7.1  3 X Average Risk  23.4   11.0        Use the calculated Patient Ratio above and the CHD Risk Table to determine the patient's CHD Risk.        ATP III CLASSIFICATION (LDL):  <100     mg/dL   Optimal  100-129  mg/dL   Near or Above                     Optimal  130-159  mg/dL   Borderline  160-189  mg/dL   High  >190     mg/dL   Very High    Additional studies/ records that were reviewed today include: Summarized above  ASSESSMENT & PLAN:   1. Paroxysmal atrial fibrillation - maintaining NSR on current regimen. Last visit I reviewed plan for the flecainide with Dr. Nelson/Allred given that 11/2016 was the only episode we knew of where she'd had atrial fibrillation. Given her comorbidities it was felt that she would not tolerate recurrence of arrhythmia well, so the recommendation was to continue the flecanide. See below re: fatigue. QTc and HR appear stable. No new anginal symptoms. Will update echo to ensure LVEF is stable. Update lytes. Update CBC. Apixaban dose remains appropriate. 2. Fatigue - ongoing complaint since 11/2016. No specific cardiac component. Will check labs including TSH, vitamin D. Follow up echo. Diltiazem does not carry any specific data regarding incidence of fatigue, but UpToDate cites that up to 8% of patients on flecainide experience this. Will need to review with Dr. Meda Coffee if other workup unrevealing. Would await echocardiogram before making any changes. 3. Mitral regurgitation/aortic insufficiency/tricuspid regurgitation - reassess by echo, following conservatively as above. 4. Chronic diastolic CHF - appears euvolemic. Continue to follow.   5. Essential HTN - BP remains mildly elevated but will await labs. We discussed home BP tracking as encouraged by Dr. Prescott Gum. She has a wrist cuff but given these are traditionally less accurate, I suggested she try to get an arm cuff instead. She has follow-up with her PCP in October to follow this up.  Disposition: F/u with Dr. Meda Coffee in 6 months.  Medication Adjustments/Labs and Tests Ordered: Current medicines are reviewed at length with the patient today.  Concerns regarding medicines are outlined above. Medication changes, Labs and Tests ordered today are  summarized above and listed in the Patient Instructions accessible in Encounters.   Signed, Charlie Pitter, PA-C  07/30/2018 9:54 AM    [facility] [address], [place], [place]  [address] Phone: [phone]; Fax: [phone]

## 2018-07-30 ENCOUNTER — Ambulatory Visit (INDEPENDENT_AMBULATORY_CARE_PROVIDER_SITE_OTHER): Payer: Medicare Other | Admitting: Physician Assistant

## 2018-07-30 ENCOUNTER — Encounter: Payer: Self-pay | Admitting: Physician Assistant

## 2018-07-30 VITALS — BP 142/88 | HR 70 | Ht 59.0 in | Wt 111.2 lb

## 2018-07-30 DIAGNOSIS — I071 Rheumatic tricuspid insufficiency: Secondary | ICD-10-CM | POA: Diagnosis not present

## 2018-07-30 DIAGNOSIS — I351 Nonrheumatic aortic (valve) insufficiency: Secondary | ICD-10-CM | POA: Diagnosis not present

## 2018-07-30 DIAGNOSIS — I5032 Chronic diastolic (congestive) heart failure: Secondary | ICD-10-CM | POA: Diagnosis not present

## 2018-07-30 DIAGNOSIS — I340 Nonrheumatic mitral (valve) insufficiency: Secondary | ICD-10-CM

## 2018-07-30 DIAGNOSIS — Z79899 Other long term (current) drug therapy: Secondary | ICD-10-CM | POA: Diagnosis not present

## 2018-07-30 DIAGNOSIS — I10 Essential (primary) hypertension: Secondary | ICD-10-CM | POA: Diagnosis not present

## 2018-07-30 DIAGNOSIS — I480 Paroxysmal atrial fibrillation: Secondary | ICD-10-CM

## 2018-07-30 DIAGNOSIS — M810 Age-related osteoporosis without current pathological fracture: Secondary | ICD-10-CM | POA: Diagnosis not present

## 2018-07-30 DIAGNOSIS — R5383 Other fatigue: Secondary | ICD-10-CM

## 2018-07-30 NOTE — Patient Instructions (Signed)
Medication Instructions:  Your physician recommends that you continue on your current medications as directed. Please refer to the Current Medication list given to you today.   Labwork: TODAY:  TSH, VIT D, MAG, BMET, & CBC  Testing/Procedures: Your physician has requested that you have an echocardiogram. Echocardiography is a painless test that uses sound waves to create images of your heart. It provides your doctor with information about the size and shape of your heart and how well your heart's chambers and valves are working. This procedure takes approximately one hour. There are no restrictions for this procedure.    Follow-Up: Your physician wants you to follow-up in: 6 MONTHS WITH DR. Johann Capers will receive a reminder letter in the mail two months in advance. If you don't receive a letter, please call our office to schedule the follow-up appointment.   Any Other Special Instructions Will Be Listed Below (If Applicable). Echocardiogram An echocardiogram, or echocardiography, uses sound waves (ultrasound) to produce an image of your heart. The echocardiogram is simple, painless, obtained within a short period of time, and offers valuable information to your health care provider. The images from an echocardiogram can provide information such as:  Evidence of coronary artery disease (CAD).  Heart size.  Heart muscle function.  Heart valve function.  Aneurysm detection.  Evidence of a past heart attack.  Fluid buildup around the heart.  Heart muscle thickening.  Assess heart valve function.  Tell a health care provider about:  Any allergies you have.  All medicines you are taking, including vitamins, herbs, eye drops, creams, and over-the-counter medicines.  Any problems you or family members have had with anesthetic medicines.  Any blood disorders you have.  Any surgeries you have had.  Any medical conditions you have.  Whether you are pregnant or may be  pregnant. What happens before the procedure? No special preparation is needed. Eat and drink normally. What happens during the procedure?  In order to produce an image of your heart, gel will be applied to your chest and a wand-like tool (transducer) will be moved over your chest. The gel will help transmit the sound waves from the transducer. The sound waves will harmlessly bounce off your heart to allow the heart images to be captured in real-time motion. These images will then be recorded.  You may need an IV to receive a medicine that improves the quality of the pictures. What happens after the procedure? You may return to your normal schedule including diet, activities, and medicines, unless your health care provider tells you otherwise. This information is not intended to replace advice given to you by your health care provider. Make sure you discuss any questions you have with your health care provider. Document Released: 10/26/2000 Document Revised: 06/16/2016 Document Reviewed: 07/06/2013 Elsevier Interactive Patient Education  2017 [employer].    If you need a refill on your cardiac medications before your next appointment, please call your pharmacy.

## 2018-07-31 ENCOUNTER — Telehealth: Payer: Self-pay | Admitting: *Deleted

## 2018-07-31 DIAGNOSIS — Z79899 Other long term (current) drug therapy: Secondary | ICD-10-CM

## 2018-07-31 LAB — MAGNESIUM: Magnesium: 2.2 mg/dL (ref 1.6–2.3)

## 2018-07-31 LAB — BASIC METABOLIC PANEL
BUN/Creatinine Ratio: 35 — ABNORMAL HIGH (ref 12–28)
BUN: 28 mg/dL — ABNORMAL HIGH (ref 8–27)
CALCIUM: 9.7 mg/dL (ref 8.7–10.3)
CO2: 25 mmol/L (ref 20–29)
Chloride: 99 mmol/L (ref 96–106)
Creatinine, Ser: 0.81 mg/dL (ref 0.57–1.00)
GFR, EST AFRICAN AMERICAN: 79 mL/min/{1.73_m2} (ref >59)
GFR, EST NON AFRICAN AMERICAN: 69 mL/min/{1.73_m2} (ref >59)
Glucose: 110 mg/dL — ABNORMAL HIGH (ref 65–99)
POTASSIUM: 4.1 mmol/L (ref 3.5–5.2)
Sodium: 138 mmol/L (ref 134–144)

## 2018-07-31 LAB — CBC
HEMATOCRIT: 41.7 % (ref 34.0–46.6)
HEMOGLOBIN: 14.3 g/dL (ref 11.1–15.9)
MCH: 29.8 pg (ref 26.6–33.0)
MCHC: 34.3 g/dL (ref 31.5–35.7)
MCV: 87 fL (ref 79–97)
Platelets: 194 10*3/uL (ref 150–450)
RBC: 4.8 x10E6/uL (ref 3.77–5.28)
RDW: 14.3 % (ref 12.3–15.4)
WBC: 8.3 10*3/uL (ref 3.4–10.8)

## 2018-07-31 LAB — TSH: TSH: 2.02 u[IU]/mL (ref 0.450–4.500)

## 2018-07-31 LAB — VITAMIN D 25 HYDROXY (VIT D DEFICIENCY, FRACTURES): Vit D, 25-Hydroxy: 50.7 ng/mL (ref 30.0–100.0)

## 2018-07-31 MED ORDER — FUROSEMIDE 20 MG PO TABS: 20.0000 mg | ORAL_TABLET | ORAL | 3 refills | Status: DC | PRN

## 2018-07-31 MED ORDER — LOSARTAN POTASSIUM 25 MG PO TABS: 25.0000 mg | ORAL_TABLET | Freq: Every day | ORAL | 3 refills | Status: DC

## 2018-07-31 NOTE — Telephone Encounter (Signed)
-----   Message from Charlie Pitter, [place] sent at 07/31/2018  6:47 AM EDT ----- Please let patient know labs are generally unrevealing. Will await echo before deciding what to do with meds. I did some research and found that flecainide does have an 8% incidence of fatigue so we might look at stopping this down the road but I prefer to wait for echo first to make some decisions. BUN is a little elevated, so she might be experiencing some dehydration symptoms. Let's try moving Lasix to only PRN swelling/weight gain of 3-5 pounds (instead of 3x a week as scheduled), and starting losartan 25mg  daily for blood pressure with repeat BMET when she comes in to have her echo. Can you get her back to see me on 10/10? Thanks.  Dayna Dunn PA-C

## 2018-08-04 ENCOUNTER — Other Ambulatory Visit (HOSPITAL_COMMUNITY): Payer: Medicare Other

## 2018-08-05 ENCOUNTER — Telehealth: Payer: Self-pay | Admitting: Physician Assistant

## 2018-08-05 NOTE — Telephone Encounter (Signed)
Returned pt's call. She was concerned about having the echo since she had cracked some ribs 4 weeks ago and was concerned about pain? Pt was advised that her echo shouldn't cause her pain. She agreed to keep the appt and thanked me for the call back.

## 2018-08-05 NOTE — Telephone Encounter (Signed)
New Message:    Patient has questions before her ECHO tomorrow, maybe looking to cancel appt

## 2018-08-06 ENCOUNTER — Other Ambulatory Visit: Payer: Medicare Other | Admitting: *Deleted

## 2018-08-06 ENCOUNTER — Other Ambulatory Visit: Payer: Self-pay

## 2018-08-06 ENCOUNTER — Ambulatory Visit (HOSPITAL_COMMUNITY): Payer: Medicare Other | Attending: Physician Assistant

## 2018-08-06 DIAGNOSIS — I712 Thoracic aortic aneurysm, without rupture: Secondary | ICD-10-CM | POA: Insufficient documentation

## 2018-08-06 DIAGNOSIS — E785 Hyperlipidemia, unspecified: Secondary | ICD-10-CM | POA: Insufficient documentation

## 2018-08-06 DIAGNOSIS — Z79899 Other long term (current) drug therapy: Secondary | ICD-10-CM

## 2018-08-06 DIAGNOSIS — M797 Fibromyalgia: Secondary | ICD-10-CM | POA: Diagnosis not present

## 2018-08-06 DIAGNOSIS — I083 Combined rheumatic disorders of mitral, aortic and tricuspid valves: Secondary | ICD-10-CM | POA: Diagnosis not present

## 2018-08-06 DIAGNOSIS — I110 Hypertensive heart disease with heart failure: Secondary | ICD-10-CM | POA: Insufficient documentation

## 2018-08-06 DIAGNOSIS — I480 Paroxysmal atrial fibrillation: Secondary | ICD-10-CM | POA: Insufficient documentation

## 2018-08-06 DIAGNOSIS — M810 Age-related osteoporosis without current pathological fracture: Secondary | ICD-10-CM | POA: Diagnosis not present

## 2018-08-06 DIAGNOSIS — I509 Heart failure, unspecified: Secondary | ICD-10-CM | POA: Diagnosis not present

## 2018-08-06 DIAGNOSIS — C50911 Malignant neoplasm of unspecified site of right female breast: Secondary | ICD-10-CM | POA: Diagnosis not present

## 2018-08-07 ENCOUNTER — Telehealth: Payer: Self-pay

## 2018-08-07 LAB — BASIC METABOLIC PANEL WITH GFR
BUN/Creatinine Ratio: 37 — ABNORMAL HIGH (ref 12–28)
BUN: 27 mg/dL (ref 8–27)
CO2: 25 mmol/L (ref 20–29)
Calcium: 9.6 mg/dL (ref 8.7–10.3)
Chloride: 97 mmol/L (ref 96–106)
Creatinine, Ser: 0.73 mg/dL (ref 0.57–1.00)
GFR calc Af Amer: 90 mL/min/1.73 (ref >59)
GFR calc non Af Amer: 78 mL/min/1.73 (ref >59)
Glucose: 99 mg/dL (ref 65–99)
Potassium: 4.1 mmol/L (ref 3.5–5.2)
Sodium: 140 mmol/L (ref 134–144)

## 2018-08-07 NOTE — Telephone Encounter (Signed)
Attempted to call pt twice and received a fast busy signal will try again later this morning. RE: Lab and echo results.

## 2018-08-07 NOTE — Progress Notes (Signed)
Opened addendum in error 

## 2018-08-07 NOTE — Progress Notes (Signed)
Spoke with the pt and gave her echo and Bmet results and she is still concerned that her BP is up and down.. I advised her that she needs to keep a record of her readings and the times that she is taking it and will keep her 08/21/18 appt with Melina Copa.

## 2018-08-08 ENCOUNTER — Ambulatory Visit: Payer: Medicare Other | Admitting: Cardiology

## 2018-08-20 NOTE — Progress Notes (Signed)
Cardiology Office Note    Date:  08/21/2018  ID:  [name], DOB 06/13/1936, MRN [id number] PCP:  [name], MD  Cardiologist:  Ena Dawley, MD   Chief Complaint: f/u fatigue  History of Present Illness:  [name] is a 82 y.o. female with history of osteoporosis, hyperlipidemia, hypertension, hypothyroidism, breast Ca (lumpectopy/ XRT 2012-3), DVT in 2012, GERD, 4.7cm ascending thoracic aneurysm (followed by Dr. Prescott Gum), anxiety, paroxysmal atrial fib, chronic diastolic CHF, pulm HTN, AI/MVP/TR, pulm HTN who presents for f/u of echocardiogram.  To recap hx, remote nuc in 2012 was wnl. She was admitted in 11/2016 with SOB and found to have rapid atrial fib in the setting of acute respiratory failure with hypoxia and PNA. She was also treated for presumed diastolic CHF with IV Lasix. 2D echo 11/28/16: mild LVH, EF 55-60%, mild-mod AI, mod MR, severe LAE, mod RAE, severe TR, PASP 67. 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 She spontaneously converted to NSR on diltiazem and flecainide. She was seen by Dr. Prescott Gum who did not feel she was a candidate for any aneurysm or valve surgery given her severe kyphosis. F/u nuc 01/2017 was poor quality study but low risk, EF 72%. At last OV I had reviewed plan for flecainide with Dr. Newt Minion. Allred as she had only had one known clinical episode of AF and recommendation was to continue since she was fairly stable on this regimen. She saw Dr. Prescott Gum 04/2018 for her TAA and is to continue yearly surveillance with CXR next year. I saw her back in clinic in September 2019 at which time she continued to report chronically low energy ever since 11/2016 - she would have a hard time getting going in the AM but in the afternoon she would feel fine after her  lunchtime soda. She does not engage in any social activities or hobbies outside the home. F/u echo 08/06/18 showed EF 55-60%, grade 1 DD, mild AI, mildly dilated ascending aorta, moderate MVP with MR, moderate-severely dilated LA, moderate TR, moderate pulm HTN with PASP 50mmHg. Labs 07/30/17 showed normal TSH, normal CBC, Mg 2.2, BUN 28, Cr 0.81, K 4.1. Lasix was changed to PRN and losartan was started with normalized BUN on 9/25.  She returns for follow-up today overall feeling better in a way she can't describe. Her fatigue has improved. Her blood pressure appears generally controlled by home logs. She lost her husband a few years ago and lives by herself and no longer goes out much. Her next door neighbor lost her own husband many years ago and they call each other to check on one another every morning. No CP, SOB, palpitations.   Past Medical History:  Diagnosis Date  . Anxiety   . Aortic regurgitation   . Breast cancer ([place]) 08/20/2011   R breast DCIS, ER/PR +  . Cataract 3 and 10/92   bilateral  . Chronic diastolic CHF (congestive heart failure) ([place])   . Compression fracture of fourth lumbar vertebra (HCC)   . DVT (deep venous thrombosis) ([place]) 08/2011   LL extremity   . Fibromyalgia   . Fracture lumbar vertebra-closed ([place])   . Fracture of thoracic vertebra, closed ([place])   . GERD (gastroesophageal reflux disease)   . H/O hiatal hernia   . History of blood  clots   . History of radiation therapy 01/2012   R breast  . Hypercholesteremia   . Hypertension    DR Orinda Kenner  . Hypothyroidism   . Mitral regurgitation    12/06/16 TEE: Moderate MR with a flail P2 segment that billows and thickened leaflets suggestive of form fruste variant of Barlow&'s disease  . Osteoporosis   . PAF (paroxysmal atrial fibrillation) ([place])    a. dx 11/2016.  . Rib fractures   . Thoracic ascending aortic aneurysm ([place])    a. followed by Dr. Prescott Gum.    Past Surgical History:  Procedure Laterality Date   . ANTERIOR CERVICAL DECOMP/DISCECTOMY FUSION N/A 09/09/2015   Procedure: Anterior Cervical Decompression and Fusion Cervical seven-Thorasic one ;  Surgeon: Erline Levine, MD;  Location: [place] NEURO ORS;  Service: Neurosurgery;  Laterality: N/A;  . APPENDECTOMY  1940  . BREAST SURGERY    . CATARACT EXTRACTION  1992  . EYE SURGERY  1940, 1956  . HERNIA REPAIR  10/16/2006   [facility] - Dr Hassell Done  . KYPHOPLASTY N/A 01/26/2013   Procedure: KYPHOPLASTY;  Surgeon: Kristeen Miss, MD;  Location: [place] NEURO ORS;  Service: Neurosurgery;  Laterality: N/A;  T11 and L1  . KYPHOPLASTY N/A 06/21/2017   Procedure: Lumbar four Kyphoplasty;  Surgeon: Erline Levine, MD;  Location: [facility];  Service: Neurosurgery;  Laterality: N/A;  . MASTECTOMY, PARTIAL  10/17/2011   Procedure: MASTECTOMY PARTIAL;  Surgeon: Haywood Lasso, MD;  Location: [facility];  Service: General;  Laterality: Right;  needle guided  . TEE WITHOUT CARDIOVERSION N/A 12/04/2016   Procedure: TRANSESOPHAGEAL ECHOCARDIOGRAM (TEE);  Surgeon: Pixie Casino, MD;  [facility];  Service: Cardiovascular;  Laterality: N/A;  . TONSILLECTOMY  1944    Current Medications: Current Meds  Medication Sig  . apixaban (ELIQUIS) 2.5 MG TABS tablet Take 1 tablet (2.5 mg total) by mouth 2 (two) times daily.  Marland Kitchen BONIVA 150 MG tablet Take 150 mg by mouth every 30 (thirty) days.   . calcium citrate-vitamin D (CITRACAL+D) 315-200 MG-UNIT per tablet Take 1 tablet by mouth 2 (two) times daily. (Patient taking differently: Take 1 tablet by mouth 2 (two) times daily. With breakfast & with supper)  . Camphor-Eucalyptus-Menthol (VICKS VAPORUB EX) Apply 1 application topically daily as needed (toenail fungus).   . cholecalciferol (VITAMIN D) 1000 UNITS tablet Take 1,000 Units by mouth daily with lunch. Vitamin D3  . conjugated estrogens (PREMARIN) vaginal cream Place 1 Applicatorful vaginally 2 (two) times a week.  . diltiazem (CARDIZEM CD) 180 MG 24 hr capsule Take 1 capsule  (180 mg total) by mouth daily.  . flecainide (TAMBOCOR) 50 MG tablet TAKE ONE TABLET EVERY TWELVE HOURS.  . furosemide (LASIX) 20 MG tablet Take 1 tablet (20 mg total) by mouth as needed for fluid or edema.  Marland Kitchen HYDROcodone-acetaminophen (NORCO/VICODIN) 5-325 MG tablet Take 1-2 tablets by mouth every 4 (four) hours as needed (mild pain). (Patient taking differently: Take 1-2 tablets by mouth every 4 (four) hours as needed (for back pain.). )  . iron polysaccharides (NIFEREX) 150 MG capsule Take 150 mg by mouth 3 (three) times a week.   . levothyroxine (SYNTHROID, LEVOTHROID) 125 MCG tablet Take 125 mcg by mouth daily before breakfast.  . losartan (COZAAR) 25 MG tablet Take 1 tablet (25 mg total) by mouth daily.  . Multiple Vitamin (MULTIVITAMIN) tablet Take 1 tablet by mouth daily with breakfast.   . omeprazole (PRILOSEC OTC) 20 MG tablet Take 20 mg  by mouth daily before breakfast.   . pravastatin (PRAVACHOL) 20 MG tablet Take 20 mg by mouth daily with supper.   . solifenacin (VESICARE) 5 MG tablet Take 5 mg by mouth daily with breakfast.       Allergies:   Contrast media [iodinated diagnostic agents]; Iohexol; Mucinex [guaifenesin er]; Zanaflex [tizanidine hcl]; Penicillins; and Robaxin [methocarbamol]   Social History   Socioeconomic History  . Marital status: Married    Spouse name: Not on file  . Number of children: Not on file  . Years of education: Not on file  . Highest education level: Not on file  Occupational History  . Not on file  Social Needs  . Financial resource strain: Not on file  . Food insecurity:    Worry: Not on file    Inability: Not on file  . Transportation needs:    Medical: Not on file    Non-medical: Not on file  Tobacco Use  . Smoking status: Never Smoker  . Smokeless tobacco: Never Used  Substance and Sexual Activity  . Alcohol use: No  . Drug use: No  . Sexual activity: Never  Lifestyle  . Physical activity:    Days per week: Not on file     Minutes per session: Not on file  . Stress: Not on file  Relationships  . Social connections:    Talks on phone: Not on file    Gets together: Not on file    Attends religious service: Not on file    Active member of club or organization: Not on file    Attends meetings of clubs or organizations: Not on file    Relationship status: Not on file  Other Topics Concern  . Not on file  Social History Narrative  . Not on file     Family History:  The patient's family history includes Heart disease in her maternal grandfather, maternal uncle, and mother.  ROS:   Please see the history of present illness. + 1 fall in several years time was looking at TV while walking and tripped  All other systems are reviewed and otherwise negative.    PHYSICAL EXAM:   VS:  BP 128/88   Pulse 65   Ht 4\' 11"  (1.499 m)   Wt 112 lb 9.6 oz (51.1 kg)   SpO2 94%   BMI 22.74 kg/m   BMI: Body mass index is 22.74 kg/m. GEN: Frail [name] in no acute distress HEENT: normocephalic, atraumatic Neck: no JVD, carotid bruits, or masses Cardiac: RRR; 2/6 SEM LLSB, no rubs or gallops, no edema  Respiratory:  clear to auscultation bilaterally, normal work of breathing GI: soft, nontender, nondistended, + BS MS: severe asymmetric kyphosis Skin: warm and dry, no rash Neuro:  Alert and Oriented x 3, Strength and sensation are intact, follows commands Psych: euthymic mood, full affect  Wt Readings from Last 3 Encounters:  08/21/18 112 lb 9.6 oz (51.1 kg)  07/30/18 111 lb 3.2 oz (50.4 kg)  04/23/18 110 lb (49.9 kg)      Studies/Labs Reviewed:   EKG:  EKG was not ordered today  Recent Labs: 07/30/2018: Hemoglobin 14.3; Magnesium 2.2; Platelets 194; TSH 2.020 08/06/2018: BUN 27; Creatinine, Ser 0.73; Potassium 4.1; Sodium 140   Lipid Panel    Component Value Date/Time   CHOL  12/02/2010 0400    149        ATP III CLASSIFICATION:  <200     mg/dL   Desirable  200-239  mg/dL   Borderline High  >=240     mg/dL   High          TRIG 54 12/02/2010 0400   HDL 58 12/02/2010 0400   CHOLHDL 2.6 12/02/2010 0400   VLDL 11 12/02/2010 0400   LDLCALC  12/02/2010 0400    80        Total Cholesterol/HDL:CHD Risk Coronary Heart Disease Risk Table                     Men   Women  1/2 Average Risk   3.4   3.3  Average Risk       5.0   4.4  2 X Average Risk   9.6   7.1  3 X Average Risk  23.4   11.0        Use the calculated Patient Ratio above and the CHD Risk Table to determine the patient's CHD Risk.        ATP III CLASSIFICATION (LDL):  <100     mg/dL   Optimal  100-129  mg/dL   Near or Above                    Optimal  130-159  mg/dL   Borderline  160-189  mg/dL   High  >190     mg/dL   Very High    Additional studies/ records that were reviewed today include: Summarized above.  ASSESSMENT & PLAN:   1. Fatigue - improved with d/c of Lasix (changed to PRN) and addition of losartan. I would concerned about risk of recurrent AF if we tried stopping flecainide so since she feels overall better, will hold course. I also wonder if some of her motivation/drive is lacking due to depression as she tends to keep to herself since husband died. I gave her the number of [facility] as a potential resource of support. 2. Paroxysmal atrial fibrillation - see above. Continue Eliquis. She did have 1 fall in the last year but states this was her fault because she was looking at the TV in a different direction than walking. Observe for recurrent falls. Most recent labs relatively stable. 3. Chronic diastolic CHF  - appears euvolemic. Lasix is now only PRN. She's only had to use one time since last visit. 4. Valvular heart disease with mild AI, moderate MVP/MR, moderate TR and pulm HTN - this all appears relatively stable on most recent echocardiogram.  Disposition: F/u with Dr. Meda Coffee in 6 months.  Medication Adjustments/Labs and Tests Ordered: Current medicines are reviewed at length with the  patient today.  Concerns regarding medicines are outlined above. Medication changes, Labs and Tests ordered today are summarized above and listed in the Patient Instructions accessible in Encounters.   Signed, Charlie Pitter, PA-C  08/21/2018 11:34 AM    [address], [place], [place]  [address] Phone: [phone]; Fax: [phone]

## 2018-08-21 ENCOUNTER — Ambulatory Visit (INDEPENDENT_AMBULATORY_CARE_PROVIDER_SITE_OTHER): Payer: Medicare Other | Admitting: Physician Assistant

## 2018-08-21 ENCOUNTER — Encounter: Payer: Self-pay | Admitting: Physician Assistant

## 2018-08-21 VITALS — BP 128/88 | HR 65 | Ht 59.0 in | Wt 112.6 lb

## 2018-08-21 DIAGNOSIS — R5383 Other fatigue: Secondary | ICD-10-CM | POA: Diagnosis not present

## 2018-08-21 DIAGNOSIS — I480 Paroxysmal atrial fibrillation: Secondary | ICD-10-CM | POA: Diagnosis not present

## 2018-08-21 DIAGNOSIS — I38 Endocarditis, valve unspecified: Secondary | ICD-10-CM | POA: Diagnosis not present

## 2018-08-21 DIAGNOSIS — I272 Pulmonary hypertension, unspecified: Secondary | ICD-10-CM

## 2018-08-21 DIAGNOSIS — I5032 Chronic diastolic (congestive) heart failure: Secondary | ICD-10-CM

## 2018-08-21 NOTE — Patient Instructions (Addendum)
Medication Instructions:  Your physician recommends that you continue on your current medications as directed. Please refer to the Current Medication list given to you today.  If you need a refill on your cardiac medications before your next appointment, please call your pharmacy.   Lab work: None ordered  If you have labs (blood work) drawn today and your tests are completely normal, you will receive your results only by: Marland Kitchen MyChart Message (if you have MyChart) OR . A paper copy in the mail If you have any lab test that is abnormal or we need to change your treatment, we will call you to review the results.  Testing/Procedures: None ordered  Follow-Up: At [facility], you and your health needs are our priority.  As part of our continuing mission to provide you with exceptional heart care, we have created designated Provider Care Teams.  These Care Teams include your primary Cardiologist (physician) and Advanced Practice Providers (APPs -  Physician Assistants and Nurse Practitioners) who all work together to provide you with the care you need, when you need it. You will need a follow up appointment in 5 months.  Please call our office 2 months in advance to schedule this appointment.  You may see Ena Dawley, MD or one of the following Advanced Practice Providers on your designated Care Team:   [place], PA-C Melina Copa, PA-C . Ermalinda Barrios, PA-C  Any Other Special Instructions Will Be Listed Below (If Applicable). [facility] # [phone]

## 2018-09-11 DIAGNOSIS — M1990 Unspecified osteoarthritis, unspecified site: Secondary | ICD-10-CM | POA: Diagnosis not present

## 2018-09-11 DIAGNOSIS — I480 Paroxysmal atrial fibrillation: Secondary | ICD-10-CM | POA: Diagnosis not present

## 2018-09-11 DIAGNOSIS — M5030 Other cervical disc degeneration, unspecified cervical region: Secondary | ICD-10-CM | POA: Diagnosis not present

## 2018-09-11 DIAGNOSIS — G894 Chronic pain syndrome: Secondary | ICD-10-CM | POA: Diagnosis not present

## 2018-09-11 DIAGNOSIS — I10 Essential (primary) hypertension: Secondary | ICD-10-CM | POA: Diagnosis not present

## 2018-09-11 DIAGNOSIS — Z23 Encounter for immunization: Secondary | ICD-10-CM | POA: Diagnosis not present

## 2018-09-11 DIAGNOSIS — K219 Gastro-esophageal reflux disease without esophagitis: Secondary | ICD-10-CM | POA: Diagnosis not present

## 2018-09-11 DIAGNOSIS — I872 Venous insufficiency (chronic) (peripheral): Secondary | ICD-10-CM | POA: Diagnosis not present

## 2018-09-11 DIAGNOSIS — D509 Iron deficiency anemia, unspecified: Secondary | ICD-10-CM | POA: Diagnosis not present

## 2018-09-18 DIAGNOSIS — N814 Uterovaginal prolapse, unspecified: Secondary | ICD-10-CM | POA: Diagnosis not present

## 2018-09-18 DIAGNOSIS — N8111 Cystocele, midline: Secondary | ICD-10-CM | POA: Diagnosis not present

## 2018-09-25 ENCOUNTER — Other Ambulatory Visit: Payer: Self-pay | Admitting: Cardiology

## 2018-10-28 ENCOUNTER — Other Ambulatory Visit: Payer: Self-pay | Admitting: Internal Medicine

## 2018-10-28 ENCOUNTER — Ambulatory Visit
Admission: RE | Admit: 2018-10-28 | Discharge: 2018-10-28 | Disposition: A | Discharge status: Home / Self Care | Payer: Medicare Other | Source: Ambulatory Visit | Attending: Internal Medicine | Admitting: Internal Medicine

## 2018-10-28 DIAGNOSIS — R0789 Other chest pain: Secondary | ICD-10-CM

## 2018-12-23 DIAGNOSIS — N8111 Cystocele, midline: Secondary | ICD-10-CM | POA: Diagnosis not present

## 2018-12-23 DIAGNOSIS — N814 Uterovaginal prolapse, unspecified: Secondary | ICD-10-CM | POA: Diagnosis not present

## 2019-01-07 ENCOUNTER — Other Ambulatory Visit: Payer: Self-pay | Admitting: Cardiology

## 2019-02-06 ENCOUNTER — Telehealth: Payer: Self-pay

## 2019-02-06 ENCOUNTER — Telehealth: Payer: Self-pay | Admitting: Cardiology

## 2019-02-06 NOTE — Telephone Encounter (Signed)
Based on chart review please change this appointment to telemedicine.  Thank you,  Ena Dawley

## 2019-02-06 NOTE — Telephone Encounter (Signed)

## 2019-02-12 ENCOUNTER — Ambulatory Visit: Payer: Medicare Other | Admitting: Cardiology

## 2019-03-12 ENCOUNTER — Other Ambulatory Visit: Payer: Self-pay | Admitting: Cardiology

## 2019-03-12 NOTE — Telephone Encounter (Signed)
[name], PA 08/21/18, last labs 08/06/18 Creat 0.73, age 83, weight 51.1kg, based on specified criteria pt is on appropriate dosage of Eliquis 2.5mg  BID.  Will refill rx.

## 2019-04-07 DIAGNOSIS — R350 Frequency of micturition: Secondary | ICD-10-CM | POA: Diagnosis not present

## 2019-04-07 DIAGNOSIS — N3281 Overactive bladder: Secondary | ICD-10-CM | POA: Diagnosis not present

## 2019-04-07 DIAGNOSIS — R351 Nocturia: Secondary | ICD-10-CM | POA: Diagnosis not present

## 2019-04-07 DIAGNOSIS — N390 Urinary tract infection, site not specified: Secondary | ICD-10-CM | POA: Diagnosis not present

## 2019-04-15 DIAGNOSIS — N8111 Cystocele, midline: Secondary | ICD-10-CM | POA: Diagnosis not present

## 2019-04-15 DIAGNOSIS — N814 Uterovaginal prolapse, unspecified: Secondary | ICD-10-CM | POA: Diagnosis not present

## 2019-04-15 DIAGNOSIS — R102 Pelvic and perineal pain: Secondary | ICD-10-CM | POA: Diagnosis not present

## 2019-04-15 DIAGNOSIS — N952 Postmenopausal atrophic vaginitis: Secondary | ICD-10-CM | POA: Diagnosis not present

## 2019-04-22 ENCOUNTER — Encounter: Payer: Medicare Other | Admitting: Cardiothoracic Surgery

## 2019-04-23 ENCOUNTER — Other Ambulatory Visit: Payer: Self-pay

## 2019-04-23 ENCOUNTER — Other Ambulatory Visit: Payer: Self-pay | Admitting: Cardiothoracic Surgery

## 2019-04-23 DIAGNOSIS — I712 Thoracic aortic aneurysm, without rupture: Secondary | ICD-10-CM

## 2019-04-23 DIAGNOSIS — I7121 Aneurysm of the ascending aorta, without rupture: Secondary | ICD-10-CM

## 2019-04-24 ENCOUNTER — Ambulatory Visit
Admission: RE | Admit: 2019-04-24 | Discharge: 2019-04-24 | Disposition: A | Discharge status: Home / Self Care | Payer: Medicare Other | Source: Ambulatory Visit | Attending: Cardiothoracic Surgery | Admitting: Cardiothoracic Surgery

## 2019-04-24 ENCOUNTER — Ambulatory Visit (INDEPENDENT_AMBULATORY_CARE_PROVIDER_SITE_OTHER): Payer: Medicare Other | Admitting: Cardiothoracic Surgery

## 2019-04-24 ENCOUNTER — Encounter: Payer: Self-pay | Admitting: Cardiothoracic Surgery

## 2019-04-24 VITALS — BP 145/77 | HR 78 | Temp 97.9°F | Resp 20 | Ht 59.0 in | Wt 111.0 lb

## 2019-04-24 DIAGNOSIS — J984 Other disorders of lung: Secondary | ICD-10-CM | POA: Diagnosis not present

## 2019-04-24 DIAGNOSIS — I712 Thoracic aortic aneurysm, without rupture: Secondary | ICD-10-CM | POA: Diagnosis not present

## 2019-04-24 DIAGNOSIS — I509 Heart failure, unspecified: Secondary | ICD-10-CM

## 2019-04-24 DIAGNOSIS — I7121 Aneurysm of the ascending aorta, without rupture: Secondary | ICD-10-CM

## 2019-04-24 NOTE — Progress Notes (Signed)
PCP is [name], MD Referring Provider is [name], MD  Chief Complaint  Patient presents with  . Thoracic Aortic Aneurysm    1 year f/u with CXR    HPI: Patient presents for one-year review for long history of moderate thoracic aortic fusiform aneurysm disease.  She has a stable 4.2 cm aneurysm from history of high blood pressure.  It is asymptomatic.  Because she is now 83 years old and has severe scoliosis with restrictive lung disease and is not an operative candidate we stop doing annual CT scans of the chest.  Today she returns with a chest x-ray which shows no significant change in her aortic shadow.  There is no evidence of pleural effusion or CHF.  Patient denies chest pain She denies palpitations dizziness or presyncope.  Patient does complain of hematuria and has an appointment with urology next week.  Patient does complain of ankle swelling the summer which appears mild and for which she has a as needed Lasix prescription from her medical physicians.  Patient states her blood pressures been fairly well controlled.  Today her blood pressure is 140/80.  Since her last visit low-dose losartan 25 mg a day has been added.  Past Medical History:  Diagnosis Date  . Anxiety   . Aortic regurgitation   . Breast cancer ([place]) 08/20/2011   R breast DCIS, ER/PR +  . Cataract 3 and 10/92   bilateral  . Chronic diastolic CHF (congestive heart failure) ([place])   . Compression fracture of fourth lumbar vertebra (HCC)   . DVT (deep venous thrombosis) ([place]) 08/2011   LL extremity   . Fibromyalgia   . Fracture lumbar vertebra-closed ([place])   . Fracture of thoracic vertebra, closed ([place])   . GERD (gastroesophageal reflux disease)   . H/O hiatal hernia   . History of blood clots   . History of radiation therapy 01/2012   R breast  . Hypercholesteremia   . Hypertension    DR Orinda Kenner  . Hypothyroidism   . Mitral regurgitation    12/06/16 TEE: Moderate MR with a flail P2 segment  that billows and thickened leaflets suggestive of form fruste variant of Barlow&'s disease  . Osteoporosis   . PAF (paroxysmal atrial fibrillation) ([place])    a. dx 11/2016.  . Rib fractures   . Thoracic ascending aortic aneurysm ([place])    a. followed by Dr. Prescott Gum.    Past Surgical History:  Procedure Laterality Date  . ANTERIOR CERVICAL DECOMP/DISCECTOMY FUSION N/A 09/09/2015   Procedure: Anterior Cervical Decompression and Fusion Cervical seven-Thorasic one ;  Surgeon: Erline Levine, MD;  Location: [place] NEURO ORS;  Service: Neurosurgery;  Laterality: N/A;  . APPENDECTOMY  1940  . BREAST SURGERY    . CATARACT EXTRACTION  1992  . EYE SURGERY  1940, 1956  . HERNIA REPAIR  10/16/2006   [facility] - Dr Hassell Done  . KYPHOPLASTY N/A 01/26/2013   Procedure: KYPHOPLASTY;  Surgeon: Kristeen Miss, MD;  Location: [place] NEURO ORS;  Service: Neurosurgery;  Laterality: N/A;  T11 and L1  . KYPHOPLASTY N/A 06/21/2017   Procedure: Lumbar four Kyphoplasty;  Surgeon: Erline Levine, MD;  Location: [place];  Service: Neurosurgery;  Laterality: N/A;  . MASTECTOMY, PARTIAL  10/17/2011   Procedure: MASTECTOMY PARTIAL;  Surgeon: Haywood Lasso, MD;  Location: [facility] [place];  Service: General;  Laterality: Right;  needle guided  . TEE WITHOUT CARDIOVERSION N/A 12/04/2016   Procedure: TRANSESOPHAGEAL ECHOCARDIOGRAM (TEE);  Surgeon: Pixie Casino,  MD;  Location: [facility] ENDOSCOPY;  Service: Cardiovascular;  Laterality: N/A;  . TONSILLECTOMY  1944    Family History  Problem Relation Age of Onset  . Heart disease Mother   . Heart disease Maternal Uncle   . Heart disease Maternal Grandfather     Social History Social History   Tobacco Use  . Smoking status: Never Smoker  . Smokeless tobacco: Never Used  Substance Use Topics  . Alcohol use: No  . Drug use: No    Current Outpatient Medications  Medication Sig Dispense Refill  . BONIVA 150 MG tablet Take 150 mg by mouth every 30 (thirty) days.     . calcium citrate-vitamin D  (CITRACAL+D) 315-200 MG-UNIT per tablet Take 1 tablet by mouth 2 (two) times daily. (Patient taking differently: Take 1 tablet by mouth 2 (two) times daily. With breakfast & with supper) 60 tablet 11  . Camphor-Eucalyptus-Menthol (VICKS VAPORUB EX) Apply 1 application topically daily as needed (toenail fungus).     . cholecalciferol (VITAMIN D) 1000 UNITS tablet Take 1,000 Units by mouth daily with lunch. Vitamin D3    . conjugated estrogens (PREMARIN) vaginal cream Place 1 Applicatorful vaginally 2 (two) times a week.    . diltiazem (CARDIZEM CD) 180 MG 24 hr capsule TAKE (1) CAPSULE DAILY. 90 capsule 2  . ELIQUIS 2.5 MG TABS tablet TAKE 1 TABLET BY MOUTH TWICE DAILY. 60 tablet 5  . flecainide (TAMBOCOR) 50 MG tablet Take 1 tablet (50 mg total) by mouth 2 (two) times daily. 60 tablet 10  . HYDROcodone-acetaminophen (NORCO/VICODIN) 5-325 MG tablet Take 1-2 tablets by mouth every 4 (four) hours as needed (mild pain). (Patient taking differently: Take 1-2 tablets by mouth every 4 (four) hours as needed (for back pain.). ) 30 tablet 0  . iron polysaccharides (NIFEREX) 150 MG capsule Take 150 mg by mouth 3 (three) times a week.     . levothyroxine (SYNTHROID, LEVOTHROID) 125 MCG tablet Take 125 mcg by mouth daily before breakfast.    . Multiple Vitamin (MULTIVITAMIN) tablet Take 1 tablet by mouth daily with breakfast.     . omeprazole (PRILOSEC OTC) 20 MG tablet Take 20 mg by mouth daily before breakfast.     . pravastatin (PRAVACHOL) 20 MG tablet Take 20 mg by mouth daily with supper.     . solifenacin (VESICARE) 5 MG tablet Take 5 mg by mouth daily with breakfast.     . furosemide (LASIX) 20 MG tablet Take 1 tablet (20 mg total) by mouth as needed for fluid or edema. 90 tablet 3  . losartan (COZAAR) 25 MG tablet Take 1 tablet (25 mg total) by mouth daily. 90 tablet 3   No current facility-administered medications for this visit.     Allergies  Allergen Reactions  . Contrast Media [Iodinated  Diagnostic Agents] Shortness Of Breath  . Iohexol Shortness Of Breath  . Mucinex [Guaifenesin Er] Other (See Comments)    DIZZINESS  . Zanaflex [Tizanidine Hcl] Anxiety and Other (See Comments)    Dizziness  . Penicillins Rash    Has patient had a PCN reaction causing immediate rash, facial/tongue/throat swelling, SOB or lightheadedness with hypotension: No Has patient had a PCN reaction causing severe rash involving mucus membranes or skin necrosis: No Has patient had a PCN reaction that required hospitalization No Has patient had a PCN reaction occurring within the last 10 years: No If all of the above answers are "NO", then may proceed with Cephalosporin use.   Marland Kitchen  Robaxin [Methocarbamol] Diarrhea    Review of Systems  Weight is slightly up associated with her mild ankle swelling No orthopnea PND productive cough No fever shortness of breath  BP (!) 145/77   Pulse 78   Temp 97.9 F (36.6 C) (Skin)   Resp 20   Ht 4\' 11"  (1.499 m)   Wt 111 lb (50.3 kg)   SpO2 97% Comment: RA  BMI 22.42 kg/m  Physical Exam Alert and oriented, very informed and conversant regarding her medical problems Elderly fragile female with severe thoracic deformity and spine deformity but in no acute distress Irregular heart rate of A. Fib Lungs clear Mild bilateral ankle edema No calf tenderness  Diagnostic Tests: Chest x-ray images personally reviewed showing no significant change in the mediastinal silhouette, aortic shadow  Impression: Continue blood pressure control as best therapy for her aortic disease.  She is not a candidate for surgical repair of her aorta under any condition.  Plan: Return with chest x-ray in 1 year for further review of her cardiovascular status.   Len Childs, MD [facility] [phone]

## 2019-04-28 DIAGNOSIS — N3281 Overactive bladder: Secondary | ICD-10-CM | POA: Diagnosis not present

## 2019-04-28 DIAGNOSIS — N814 Uterovaginal prolapse, unspecified: Secondary | ICD-10-CM | POA: Diagnosis not present

## 2019-04-28 DIAGNOSIS — N952 Postmenopausal atrophic vaginitis: Secondary | ICD-10-CM | POA: Diagnosis not present

## 2019-04-28 DIAGNOSIS — N390 Urinary tract infection, site not specified: Secondary | ICD-10-CM | POA: Diagnosis not present

## 2019-05-06 DIAGNOSIS — N8111 Cystocele, midline: Secondary | ICD-10-CM | POA: Diagnosis not present

## 2019-05-06 DIAGNOSIS — N814 Uterovaginal prolapse, unspecified: Secondary | ICD-10-CM | POA: Diagnosis not present

## 2019-05-08 DIAGNOSIS — N814 Uterovaginal prolapse, unspecified: Secondary | ICD-10-CM | POA: Diagnosis not present

## 2019-05-08 DIAGNOSIS — N8111 Cystocele, midline: Secondary | ICD-10-CM | POA: Diagnosis not present

## 2019-05-11 ENCOUNTER — Other Ambulatory Visit (HOSPITAL_BASED_OUTPATIENT_CLINIC_OR_DEPARTMENT_OTHER): Payer: Medicare Other

## 2019-05-11 ENCOUNTER — Other Ambulatory Visit (HOSPITAL_BASED_OUTPATIENT_CLINIC_OR_DEPARTMENT_OTHER): Payer: Self-pay | Admitting: Internal Medicine

## 2019-05-11 DIAGNOSIS — M7989 Other specified soft tissue disorders: Secondary | ICD-10-CM

## 2019-05-11 DIAGNOSIS — R600 Localized edema: Secondary | ICD-10-CM | POA: Diagnosis not present

## 2019-05-18 DIAGNOSIS — I872 Venous insufficiency (chronic) (peripheral): Secondary | ICD-10-CM | POA: Diagnosis not present

## 2019-05-21 DIAGNOSIS — N814 Uterovaginal prolapse, unspecified: Secondary | ICD-10-CM | POA: Diagnosis not present

## 2019-05-21 DIAGNOSIS — N3281 Overactive bladder: Secondary | ICD-10-CM | POA: Diagnosis not present

## 2019-05-21 DIAGNOSIS — N952 Postmenopausal atrophic vaginitis: Secondary | ICD-10-CM | POA: Diagnosis not present

## 2019-05-21 DIAGNOSIS — N390 Urinary tract infection, site not specified: Secondary | ICD-10-CM | POA: Diagnosis not present

## 2019-05-25 DIAGNOSIS — Z5181 Encounter for therapeutic drug level monitoring: Secondary | ICD-10-CM | POA: Diagnosis not present

## 2019-05-25 DIAGNOSIS — I872 Venous insufficiency (chronic) (peripheral): Secondary | ICD-10-CM | POA: Diagnosis not present

## 2019-07-01 DIAGNOSIS — N8111 Cystocele, midline: Secondary | ICD-10-CM | POA: Diagnosis not present

## 2019-07-01 DIAGNOSIS — N814 Uterovaginal prolapse, unspecified: Secondary | ICD-10-CM | POA: Diagnosis not present

## 2019-07-09 DIAGNOSIS — Z1389 Encounter for screening for other disorder: Secondary | ICD-10-CM | POA: Diagnosis not present

## 2019-07-09 DIAGNOSIS — Z0000 Encounter for general adult medical examination without abnormal findings: Secondary | ICD-10-CM | POA: Diagnosis not present

## 2019-07-16 ENCOUNTER — Other Ambulatory Visit: Payer: Self-pay | Admitting: Physician Assistant

## 2019-07-28 DIAGNOSIS — N3281 Overactive bladder: Secondary | ICD-10-CM | POA: Diagnosis not present

## 2019-07-28 DIAGNOSIS — N3000 Acute cystitis without hematuria: Secondary | ICD-10-CM | POA: Diagnosis not present

## 2019-07-29 DIAGNOSIS — H26493 Other secondary cataract, bilateral: Secondary | ICD-10-CM | POA: Diagnosis not present

## 2019-07-29 DIAGNOSIS — H52201 Unspecified astigmatism, right eye: Secondary | ICD-10-CM | POA: Diagnosis not present

## 2019-07-29 DIAGNOSIS — Z961 Presence of intraocular lens: Secondary | ICD-10-CM | POA: Diagnosis not present

## 2019-08-06 DIAGNOSIS — I872 Venous insufficiency (chronic) (peripheral): Secondary | ICD-10-CM | POA: Diagnosis not present

## 2019-08-06 DIAGNOSIS — I10 Essential (primary) hypertension: Secondary | ICD-10-CM | POA: Diagnosis not present

## 2019-08-06 DIAGNOSIS — K219 Gastro-esophageal reflux disease without esophagitis: Secondary | ICD-10-CM | POA: Diagnosis not present

## 2019-08-06 DIAGNOSIS — E782 Mixed hyperlipidemia: Secondary | ICD-10-CM | POA: Diagnosis not present

## 2019-08-06 DIAGNOSIS — M810 Age-related osteoporosis without current pathological fracture: Secondary | ICD-10-CM | POA: Diagnosis not present

## 2019-08-06 DIAGNOSIS — I480 Paroxysmal atrial fibrillation: Secondary | ICD-10-CM | POA: Diagnosis not present

## 2019-08-06 DIAGNOSIS — E039 Hypothyroidism, unspecified: Secondary | ICD-10-CM | POA: Diagnosis not present

## 2019-08-06 DIAGNOSIS — G8929 Other chronic pain: Secondary | ICD-10-CM | POA: Diagnosis not present

## 2019-08-06 DIAGNOSIS — M1990 Unspecified osteoarthritis, unspecified site: Secondary | ICD-10-CM | POA: Diagnosis not present

## 2019-08-06 DIAGNOSIS — D509 Iron deficiency anemia, unspecified: Secondary | ICD-10-CM | POA: Diagnosis not present

## 2019-08-20 ENCOUNTER — Other Ambulatory Visit: Payer: Self-pay | Admitting: Cardiology

## 2019-08-24 ENCOUNTER — Other Ambulatory Visit: Payer: Self-pay | Admitting: Cardiology

## 2019-08-24 NOTE — Telephone Encounter (Signed)
°*  STAT* If patient is at the pharmacy, call can be transferred to refill team.   1. Which medications need to be refilled? (please list name of each medication and dose if known)  flecainide (TAMBOCOR) 50 MG tablet diltiazem (CARDIZEM CD) 180 MG 24 hr capsule ELIQUIS 2.5 MG TABS tablet  2. Which pharmacy/location (including street and city if local pharmacy) is medication to be sent to?  [place], [place]  3. Do they need a 30 day or 90 day supply? 30   Patient picked up her medications today and was told to set up a visit with Dr. Meda Coffee to be able to get future refills. Dr. Meda Coffee was booked for the remainder of 2020, and the patient was not able to get in to see her PA until 12/08. Pt will need new rx sent to her pharmacy in the mean time

## 2019-08-25 MED ORDER — APIXABAN 2.5 MG PO TABS: 2.5000 mg | ORAL_TABLET | Freq: Two times a day (BID) | ORAL | 5 refills | Status: DC

## 2019-08-25 MED ORDER — DILTIAZEM HCL ER COATED BEADS 180 MG PO CP24: ORAL_CAPSULE | ORAL | 0 refills | Status: DC

## 2019-08-25 MED ORDER — FLECAINIDE ACETATE 50 MG PO TABS: 50.0000 mg | ORAL_TABLET | Freq: Two times a day (BID) | ORAL | 0 refills | Status: DC

## 2019-08-25 NOTE — Telephone Encounter (Signed)
Prescription refill request for Eliquis received.  Last office visit:  [place], (08-21-2018 ) Scr: 0.76 (08-06-2019) Age: 83 yo Weight: 50.3 kg   Pt is overdue for an appointment. Pt has appointment scheduled with [facility] on 10/20/2019. Prescription refill sent.

## 2019-08-26 DIAGNOSIS — Z23 Encounter for immunization: Secondary | ICD-10-CM | POA: Diagnosis not present

## 2019-09-11 DIAGNOSIS — L03116 Cellulitis of left lower limb: Secondary | ICD-10-CM | POA: Diagnosis not present

## 2019-09-11 DIAGNOSIS — S81812A Laceration without foreign body, left lower leg, initial encounter: Secondary | ICD-10-CM | POA: Diagnosis not present

## 2019-09-14 ENCOUNTER — Ambulatory Visit
Admission: RE | Admit: 2019-09-14 | Discharge: 2019-09-14 | Disposition: A | Discharge status: Home / Self Care | Payer: Medicare Other | Source: Ambulatory Visit | Attending: Internal Medicine | Admitting: Internal Medicine

## 2019-09-14 ENCOUNTER — Other Ambulatory Visit: Payer: Self-pay | Admitting: Internal Medicine

## 2019-09-14 DIAGNOSIS — M79671 Pain in right foot: Secondary | ICD-10-CM

## 2019-09-14 DIAGNOSIS — M79661 Pain in right lower leg: Secondary | ICD-10-CM | POA: Diagnosis not present

## 2019-09-14 DIAGNOSIS — M7989 Other specified soft tissue disorders: Secondary | ICD-10-CM | POA: Diagnosis not present

## 2019-09-14 DIAGNOSIS — R600 Localized edema: Secondary | ICD-10-CM | POA: Diagnosis not present

## 2019-09-14 DIAGNOSIS — M79604 Pain in right leg: Secondary | ICD-10-CM

## 2019-09-21 DIAGNOSIS — R0781 Pleurodynia: Secondary | ICD-10-CM | POA: Diagnosis not present

## 2019-09-21 DIAGNOSIS — G8929 Other chronic pain: Secondary | ICD-10-CM | POA: Diagnosis not present

## 2019-09-21 DIAGNOSIS — M7989 Other specified soft tissue disorders: Secondary | ICD-10-CM | POA: Diagnosis not present

## 2019-09-21 DIAGNOSIS — S81801D Unspecified open wound, right lower leg, subsequent encounter: Secondary | ICD-10-CM | POA: Diagnosis not present

## 2019-09-21 DIAGNOSIS — L03115 Cellulitis of right lower limb: Secondary | ICD-10-CM | POA: Diagnosis not present

## 2019-09-22 DIAGNOSIS — M954 Acquired deformity of chest and rib: Secondary | ICD-10-CM | POA: Diagnosis not present

## 2019-09-22 DIAGNOSIS — I517 Cardiomegaly: Secondary | ICD-10-CM | POA: Diagnosis not present

## 2019-09-22 DIAGNOSIS — M7989 Other specified soft tissue disorders: Secondary | ICD-10-CM | POA: Diagnosis not present

## 2019-09-22 DIAGNOSIS — S99911A Unspecified injury of right ankle, initial encounter: Secondary | ICD-10-CM | POA: Diagnosis not present

## 2019-09-23 DIAGNOSIS — N8111 Cystocele, midline: Secondary | ICD-10-CM | POA: Diagnosis not present

## 2019-09-23 DIAGNOSIS — N814 Uterovaginal prolapse, unspecified: Secondary | ICD-10-CM | POA: Diagnosis not present

## 2019-09-25 DIAGNOSIS — R0789 Other chest pain: Secondary | ICD-10-CM | POA: Diagnosis not present

## 2019-09-25 DIAGNOSIS — L03115 Cellulitis of right lower limb: Secondary | ICD-10-CM | POA: Diagnosis not present

## 2019-09-30 DIAGNOSIS — S81801D Unspecified open wound, right lower leg, subsequent encounter: Secondary | ICD-10-CM | POA: Diagnosis not present

## 2019-10-12 ENCOUNTER — Other Ambulatory Visit: Payer: Self-pay

## 2019-10-12 ENCOUNTER — Encounter (HOSPITAL_BASED_OUTPATIENT_CLINIC_OR_DEPARTMENT_OTHER): Payer: Medicare Other | Attending: Internal Medicine | Admitting: Internal Medicine

## 2019-10-12 DIAGNOSIS — Z7901 Long term (current) use of anticoagulants: Secondary | ICD-10-CM | POA: Diagnosis not present

## 2019-10-12 DIAGNOSIS — I5032 Chronic diastolic (congestive) heart failure: Secondary | ICD-10-CM | POA: Insufficient documentation

## 2019-10-12 DIAGNOSIS — Z8249 Family history of ischemic heart disease and other diseases of the circulatory system: Secondary | ICD-10-CM | POA: Insufficient documentation

## 2019-10-12 DIAGNOSIS — S8011XD Contusion of right lower leg, subsequent encounter: Secondary | ICD-10-CM | POA: Diagnosis not present

## 2019-10-12 DIAGNOSIS — X58XXXA Exposure to other specified factors, initial encounter: Secondary | ICD-10-CM | POA: Diagnosis not present

## 2019-10-12 DIAGNOSIS — Z853 Personal history of malignant neoplasm of breast: Secondary | ICD-10-CM | POA: Diagnosis not present

## 2019-10-12 DIAGNOSIS — I351 Nonrheumatic aortic (valve) insufficiency: Secondary | ICD-10-CM | POA: Insufficient documentation

## 2019-10-12 DIAGNOSIS — I4891 Unspecified atrial fibrillation: Secondary | ICD-10-CM | POA: Insufficient documentation

## 2019-10-12 DIAGNOSIS — S81801A Unspecified open wound, right lower leg, initial encounter: Secondary | ICD-10-CM | POA: Diagnosis not present

## 2019-10-12 DIAGNOSIS — I110 Hypertensive heart disease with heart failure: Secondary | ICD-10-CM | POA: Diagnosis not present

## 2019-10-12 DIAGNOSIS — I890 Lymphedema, not elsewhere classified: Secondary | ICD-10-CM | POA: Insufficient documentation

## 2019-10-12 DIAGNOSIS — L97212 Non-pressure chronic ulcer of right calf with fat layer exposed: Secondary | ICD-10-CM | POA: Insufficient documentation

## 2019-10-12 DIAGNOSIS — L539 Erythematous condition, unspecified: Secondary | ICD-10-CM | POA: Diagnosis not present

## 2019-10-12 DIAGNOSIS — I872 Venous insufficiency (chronic) (peripheral): Secondary | ICD-10-CM | POA: Insufficient documentation

## 2019-10-13 ENCOUNTER — Other Ambulatory Visit: Payer: Self-pay | Admitting: Physician Assistant

## 2019-10-19 ENCOUNTER — Other Ambulatory Visit: Payer: Self-pay

## 2019-10-19 ENCOUNTER — Ambulatory Visit (HOSPITAL_BASED_OUTPATIENT_CLINIC_OR_DEPARTMENT_OTHER): Payer: Medicare Other | Admitting: Internal Medicine

## 2019-10-19 ENCOUNTER — Encounter: Payer: Self-pay | Admitting: Physician Assistant

## 2019-10-19 ENCOUNTER — Encounter (HOSPITAL_BASED_OUTPATIENT_CLINIC_OR_DEPARTMENT_OTHER): Payer: Medicare Other | Attending: Internal Medicine | Admitting: Internal Medicine

## 2019-10-19 DIAGNOSIS — S8011XD Contusion of right lower leg, subsequent encounter: Secondary | ICD-10-CM | POA: Insufficient documentation

## 2019-10-19 DIAGNOSIS — I4891 Unspecified atrial fibrillation: Secondary | ICD-10-CM | POA: Diagnosis not present

## 2019-10-19 DIAGNOSIS — I890 Lymphedema, not elsewhere classified: Secondary | ICD-10-CM | POA: Insufficient documentation

## 2019-10-19 DIAGNOSIS — Z85828 Personal history of other malignant neoplasm of skin: Secondary | ICD-10-CM | POA: Insufficient documentation

## 2019-10-19 DIAGNOSIS — Z7901 Long term (current) use of anticoagulants: Secondary | ICD-10-CM | POA: Diagnosis not present

## 2019-10-19 DIAGNOSIS — Z853 Personal history of malignant neoplasm of breast: Secondary | ICD-10-CM | POA: Diagnosis not present

## 2019-10-19 DIAGNOSIS — Z880 Allergy status to penicillin: Secondary | ICD-10-CM | POA: Insufficient documentation

## 2019-10-19 DIAGNOSIS — Z881 Allergy status to other antibiotic agents status: Secondary | ICD-10-CM | POA: Insufficient documentation

## 2019-10-19 DIAGNOSIS — I5032 Chronic diastolic (congestive) heart failure: Secondary | ICD-10-CM | POA: Insufficient documentation

## 2019-10-19 DIAGNOSIS — I351 Nonrheumatic aortic (valve) insufficiency: Secondary | ICD-10-CM | POA: Diagnosis not present

## 2019-10-19 DIAGNOSIS — I110 Hypertensive heart disease with heart failure: Secondary | ICD-10-CM | POA: Diagnosis not present

## 2019-10-19 DIAGNOSIS — X58XXXA Exposure to other specified factors, initial encounter: Secondary | ICD-10-CM | POA: Diagnosis not present

## 2019-10-19 DIAGNOSIS — L97212 Non-pressure chronic ulcer of right calf with fat layer exposed: Secondary | ICD-10-CM | POA: Insufficient documentation

## 2019-10-19 DIAGNOSIS — S81801A Unspecified open wound, right lower leg, initial encounter: Secondary | ICD-10-CM | POA: Diagnosis not present

## 2019-10-19 NOTE — Progress Notes (Signed)
Virtual Visit via Telephone Note   This visit type was conducted due to national recommendations for restrictions regarding the COVID-19 Pandemic (e.g. social distancing) in an effort to limit this patient's exposure and mitigate transmission in our community.  Due to her co-morbid illnesses, this patient is at least at moderate risk for complications without adequate follow up.  This format is felt to be most appropriate for this patient at this time.  The patient did not have access to video technology/had technical difficulties with video requiring transitioning to audio format only (telephone).  All issues noted in this document were discussed and addressed.  No physical exam could be performed with this format.  The patient verbally consented to telehealth visit with [facility] today. Virtual platform was offered given ongoing worsening Covid-19 pandemic.  Date:  10/20/2019   ID:  [name], DOB 1936/06/25, MRN [id number]  Patient Location: Home Provider Location: Home  PCP:  [name], MD  Cardiologist:  Ena Dawley, MD  Electrophysiologist:  None   Evaluation Performed:  Follow-Up Visit  Chief Complaint:  Yearly f/u atrial fib, CHF, valvular disease  History of Present Illness:    [name] is a 83 y.o. female with osteoporosis, severe kyphosis, hyperlipidemia (followed by primary care), hypertension, hypothyroidism, breast CA (lumpectopy/XRT 2012-3), DVT in 2012, GERD, thoracic aneurysm (followed by Dr. Prescott Gum), anxiety, paroxysmal atrial fib, chronic diastolic CHF, pulmonary HTN, AI/MVP/TR who presents fro routine follow-up virtually.  To recap hx, remote nuc in 2012 was normal. She was admitted in 11/2016 with SOB and found to have rapid atrial fib in the setting of acute respiratory failure with hypoxia and PNA. She was also treated for presumed diastolic CHF with IV Lasix. 2D echo 11/28/16: mild LVH, EF 55-60%, mild-mod AI, mod MR, severe LAE, mod  RAE, severe TR, PASP 67. 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 She spontaneously converted to NSR on diltiazem and flecainide. F/u nuclear stress test 01/2017 was poor quality study but low risk, EF 72%. At last OV I had reviewed plan for flecainide with Dr. Newt Minion. Allred as she had only had one known clinical episode of AF and recommendation was to continue since she was fairly stable on this regimen. F/u echo 08/06/18 showed EF 55-60%, grade 1 DD, mild AI, mildly dilated ascending aorta, moderate MVP with MR, moderate-severely dilated LA, moderate TR, moderate pulm HTN with PASP 77mmHg. She has also had chronic fatigue, suspected exacerbated by social isolation and losing her husband several years ago. She follows with Dr. Prescott Gum for her TAA who did not feel she was a candidate for any aneurysm or valve surgery given her severe kyphosis.  [name] overall reports she is doing okay. Over the summer she states per PCP put her on scheduled Lasix 20mg  daily with KCl 40meq daily due to an increase in lower extremity swelling, primarily the left leg. This has resolved. However, in October she states she was at [facility] and an employee ran into her with a cart and she sustained a large gash on her right leg. She has been treated with antibiotics but has had residual swelling and wound so is now followed by wound care. She reports they did an ultrasound of her right leg that ruled out a blood clot. She remains on Eliquis  at this time. Otherwise she denies any interim issues with CP, SOB, palpitations, orthopnea, weight gain, unusual bleeding. Last labs by [facility] 07/2019 with BUN 30, Cr 0.760, K 4.1, Na 136, TSH wnl, Hgb 14.2, plt 194, HDL 63, LDL 104, trig 74.  The patient does not have symptoms concerning for COVID-19 infection (fever,  chills, cough, or new shortness of breath).    Past Medical History:  Diagnosis Date  . Anxiety   . Aortic regurgitation   . Breast cancer ([place]) 08/20/2011   R breast DCIS, ER/PR +  . Cataract 3 and 10/92   bilateral  . Chronic diastolic CHF (congestive heart failure) ([place])   . Compression fracture of fourth lumbar vertebra ([facility])   . DVT (deep venous thrombosis) ([place]) 08/2011   LL extremity   . Fibromyalgia   . Fracture lumbar vertebra-closed ([place])   . Fracture of thoracic vertebra, closed ([place])   . GERD (gastroesophageal reflux disease)   . H/O hiatal hernia   . History of blood clots   . History of radiation therapy 01/2012   R breast  . Hypercholesteremia   . Hypertension    DR Orinda Kenner  . Hypothyroidism   . Mitral regurgitation   . Mitral valve prolapse   . Osteoporosis   . PAF (paroxysmal atrial fibrillation) ([place])    a. dx 11/2016.  . Pulmonary hypertension ([place])   . Rib fractures   . Thoracic ascending aortic aneurysm ([place])    a. followed by Dr. Prescott Gum.  . Tricuspid regurgitation    Past Surgical History:  Procedure Laterality Date  . ANTERIOR CERVICAL DECOMP/DISCECTOMY FUSION N/A 09/09/2015   Procedure: Anterior Cervical Decompression and Fusion Cervical seven-Thorasic one ;  Surgeon: Erline Levine, MD;  Location: [place] [facility]URO ORS;  Service: Neurosurgery;  Laterality: N/A;  . APPENDECTOMY  1940  . BREAST SURGERY    . CATARACT EXTRACTION  1992  . EYE SURGERY  1940, 1956  . HERNIA REPAIR  10/16/2006   [facility] - Dr Hassell Done  . KYPHOPLASTY N/A 01/26/2013   Procedure: KYPHOPLASTY;  Surgeon: Kristeen Miss, MD;  Location: [place] NEURO ORS;  Service: Neurosurgery;  Laterality: N/A;  T11 and L1  . KYPHOPLASTY N/A 06/21/2017   Procedure: Lumbar four Kyphoplasty;  Surgeon: Erline Levine, MD;  Location: [place];  Service: Neurosurgery;  Laterality: N/A;  . MASTECTOMY, PARTIAL  10/17/2011   Procedure: MASTECTOMY PARTIAL;  Surgeon: Haywood Lasso, MD;  Location: [place];  Service:  General;  Laterality: Right;  needle guided  . TEE WITHOUT CARDIOVERSION N/A 12/04/2016   Procedure: TRANSESOPHAGEAL ECHOCARDIOGRAM (TEE);  Surgeon: Pixie Casino, MD;  Location: [facility] ENDOSCOPY;  Service: Cardiovascular;  Laterality: N/A;  . TONSILLECTOMY  1944     Current Meds  Medication Sig  . apixaban (ELIQUIS) 2.5 MG TABS tablet Take 1 tablet (2.5 mg total) by mouth 2 (two) times daily.  Marland Kitchen BONIVA 150 MG tablet Take 150 mg by mouth every 30 (thirty) days.   . calcium citrate-vitamin D (CITRACAL+D) 315-200 MG-UNIT per tablet Take 1 tablet by mouth 2 (two) times daily. (Patient taking differently: Take 1 tablet by mouth 2 (two) times daily. With breakfast & with supper)  . Camphor-Eucalyptus-Menthol (VICKS VAPORUB EX) Apply 1 application topically daily as needed (toenail fungus).   . cholecalciferol (VITAMIN D) 1000 UNITS tablet Take 1,000 Units by mouth daily with lunch. Vitamin D3  . conjugated estrogens (PREMARIN) vaginal cream Place 1 Applicatorful vaginally 2 (two) times a week.  . diltiazem (  CARDIZEM CD) 180 MG 24 hr capsule TAKE (1) CAPSULE DAILY. Please keep upcoming appt in December before anymore refills. Thank you  . flecainide (TAMBOCOR) 50 MG tablet Take 1 tablet (50 mg total) by mouth 2 (two) times daily. Please keep upcoming appt in December before anymore refills. Thank you  . furosemide (LASIX) 20 MG tablet Take 20 mg by mouth daily.  Marland Kitchen HYDROcodone-acetaminophen (NORCO/VICODIN) 5-325 MG tablet Take 1-2 tablets by mouth every 4 (four) hours as needed (mild pain). (Patient taking differently: Take 1-2 tablets by mouth every 4 (four) hours as needed (for back pain.). )  . iron polysaccharides (NIFEREX) 150 MG capsule Take 150 mg by mouth 3 (three) times a week.   . levothyroxine (SYNTHROID, LEVOTHROID) 125 MCG tablet Take 125 mcg by mouth daily before breakfast.  . losartan (COZAAR) 25 MG tablet Take 1 tablet (25 mg total) by mouth daily. Please keep upcoming appt in December for  future refills. Thank you  . Multiple Vitamin (MULTIVITAMIN) tablet Take 1 tablet by mouth daily with breakfast.   . omeprazole (PRILOSEC OTC) 20 MG tablet Take 20 mg by mouth daily before breakfast.   . potassium chloride (KLOR-CON) 10 MEQ tablet Take 10 mEq by mouth daily.  . pravastatin (PRAVACHOL) 20 MG tablet Take 20 mg by mouth daily with supper.   . solifenacin (VESICARE) 5 MG tablet Take 5 mg by mouth daily with breakfast.      Allergies:   Contrast media [iodinated diagnostic agents], Iohexol, Mucinex [guaifenesin er], Zanaflex [tizanidine hcl], Penicillins, and Robaxin [methocarbamol]   Social History   Tobacco Use  . Smoking status: Never Smoker  . Smokeless tobacco: Never Used  Substance Use Topics  . Alcohol use: No  . Drug use: No     Family Hx: The patient's family history includes Heart disease in her maternal grandfather, maternal uncle, and mother.  ROS:   Please see the history of present illness.    All other systems reviewed and are negative.   Prior CV studies:    Most recent pertinent cardiac studies are outlined above.  Labs/Other Tests and Data Reviewed:    EKG:  An ECG dated 07/30/18 was personally reviewed today and demonstrated:  NSR 70bpm, first degree AV block, LAFB, poor R wave progression without significant change from prior. QTc stable 430ms. QRS 137ms (stable)  Recent Labs: No results found for requested labs within last 8760 hours.   Recent Lipid Panel Lab Results  Component Value Date/Time   CHOL  12/02/2010 04:00 AM    149        ATP III CLASSIFICATION:  <200     mg/dL   Desirable  200-239  mg/dL   Borderline High  >=240    mg/dL   High          TRIG 54 12/02/2010 04:00 AM   HDL 58 12/02/2010 04:00 AM   CHOLHDL 2.6 12/02/2010 04:00 AM   LDLCALC  12/02/2010 04:00 AM    80        Total Cholesterol/HDL:CHD Risk Coronary Heart Disease Risk Table                     Men   Women  1/2 Average Risk   3.4   3.3  Average Risk        5.0   4.4  2 X Average Risk   9.6   7.1  3 X Average Risk  23.4   11.0  Use the calculated Patient Ratio above and the CHD Risk Table to determine the patient's CHD Risk.        ATP III CLASSIFICATION (LDL):  <100     mg/dL   Optimal  100-129  mg/dL   Near or Above                    Optimal  130-159  mg/dL   Borderline  160-189  mg/dL   High  >190     mg/dL   Very High    Wt Readings from Last 3 Encounters:  10/20/19 110 lb (49.9 kg)  04/24/19 111 lb (50.3 kg)  08/21/18 112 lb 9.6 oz (51.1 kg)     Objective:    Vital Signs:  BP 128/74   Pulse 69   Ht 4\' 11"  (1.499 m)   Wt 110 lb (49.9 kg)   SpO2 98%   BMI 22.22 kg/m    VS reviewed. General - calm F in no acute distress Pulm - No labored breathing, no coughing during visit, no audible wheezing, speaking in full sentences Neuro - A+Ox3, no slurred speech, answers questions appropriately Psych - Pleasant affect     ASSESSMENT & PLAN:    1. Paroxysmal atrial fibrillation - clinically stable. Heart rate is normal. She will continue present regimen. I previously had reviewed long term plan for flecainide with primary cardiologist/EP and given her clinical stability it was recommended she continue this regimen. Dose of apixaban remains appropriate. Recent K, Cr were normal in 07/2019. She is seeing primary care next month and I asked her to have them perform an EKG while in their office and share results with [place]. 2. Chronic diastolic CHF - per report she had issues with swelling this summer. She is now doing better on maintenance dose of Lasix/KCl. She has had some residual RLE swelling in the context of her leg gash. She reports DVT was ruled out. Continue management by PCP/wound care. She will call if she has any signs of volume overload otherwise. 3. Thoracic aortic aneurysm - this is followed by Dr. Prescott Gum, managed conservatively. Blood pressure is stable.  4. Valvular heart disease with moderate MVP, moderate TR,  mild aortic insufficiency and moderate pulmonary HTN - continue to follow clinically for now. If LEE worsens, can consider repeat echocardiogram. We will follow her up again in 3-4 months in person in the office.   COVID-19 Education: The signs and symptoms of COVID-19 were discussed with the patient and how to seek care for testing (follow up with PCP or arrange E-visit).  The importance of social distancing was discussed today.  Time:   Today, I have spent 20 minutes with the patient with telehealth technology discussing the above problems.     Medication Adjustments/Labs and Tests Ordered: Current medicines are reviewed at length with the patient today.  Concerns regarding medicines are outlined above.   Follow Up: in the office in 3-4 months with me  Signed, Charlie Pitter, PA-C  10/20/2019 11:02 AM    [place]

## 2019-10-20 ENCOUNTER — Telehealth (INDEPENDENT_AMBULATORY_CARE_PROVIDER_SITE_OTHER): Payer: Medicare Other | Admitting: Physician Assistant

## 2019-10-20 ENCOUNTER — Other Ambulatory Visit: Payer: Self-pay

## 2019-10-20 ENCOUNTER — Ambulatory Visit: Payer: Medicare Other | Admitting: Physician Assistant

## 2019-10-20 ENCOUNTER — Encounter: Payer: Self-pay | Admitting: Physician Assistant

## 2019-10-20 VITALS — BP 128/74 | HR 69 | Ht 59.0 in | Wt 110.0 lb

## 2019-10-20 DIAGNOSIS — I5032 Chronic diastolic (congestive) heart failure: Secondary | ICD-10-CM

## 2019-10-20 DIAGNOSIS — I712 Thoracic aortic aneurysm, without rupture, unspecified: Secondary | ICD-10-CM

## 2019-10-20 DIAGNOSIS — I480 Paroxysmal atrial fibrillation: Secondary | ICD-10-CM | POA: Diagnosis not present

## 2019-10-20 DIAGNOSIS — I38 Endocarditis, valve unspecified: Secondary | ICD-10-CM

## 2019-10-20 DIAGNOSIS — I272 Pulmonary hypertension, unspecified: Secondary | ICD-10-CM

## 2019-10-20 NOTE — Progress Notes (Signed)
[name] ([id number]) Visit Report for 10/12/2019 Abuse/Suicide Risk Screen Details Patient Name: Date of Service: [name], [name] 10/12/2019 10:30 AM Medical Record [id number] Patient Account Number: [id number] Date of Birth/Sex: Treating RN: 1936-07-17 (83 y.o. Orvan Falconer Primary Care Fernand Sorbello: [name] Other Clinician: Referring Salif Tay: Treating Rafaela Dinius/Extender:Robson, Vista Lawman, Moses Manners in Treatment: 0 Abuse/Suicide Risk Screen Items Answer ABUSE RISK SCREEN: Has anyone close to you tried to hurt or harm you recentlyo No Do you feel uncomfortable with anyone in your familyo No Has anyone forced you do things that you didnt want to doo No Electronic Signature(s) Signed: 10/20/2019 2:56:39 PM By: Carlene Coria RN Entered By: Carlene Coria on 10/12/2019 11:07:44 -------------------------------------------------------------------------------- Activities of Daily Living Details Patient Name: Date of Service: [name] 10/12/2019 10:30 AM Medical Record [id number] Patient Account Number: [id number] Date of Birth/Sex: Treating RN: Jun 04, 1936 (83 y.o. Orvan Falconer Primary Care Erva Koke: [name] Other Clinician: Referring Tierria Watson: Treating Patsy Varma/Extender:Robson, Vista Lawman, Moses Manners in Treatment: 0 Activities of Daily Living Items Answer Activities of Daily Living (Please select one for each item) Drive Automobile Completely Able Take Medications Completely Able Use Telephone Completely Able Care for Appearance Completely Able Use Toilet Completely Able Bath / Shower Completely Able Dress Self Completely Able Feed Self Completely Able Walk Completely Able Get In / Out Bed Completely Able Housework Completely Able Prepare Meals Completely Able Handle Money Completely Able Shop for Self Completely Able Electronic Signature(s) Signed: 10/20/2019 2:56:39 PM By: Carlene Coria RN Entered By: Carlene Coria on 10/12/2019 11:08:12 -------------------------------------------------------------------------------- Education Screening Details Patient Name: Date of Service: Annette Mallet C. 10/12/2019 10:30 AM Medical Record [id number] Patient Account Number: [id number] Date of Birth/Sex: Treating RN: 1935-12-11 (83 y.o. Orvan Falconer Primary Care Teran Daughenbaugh: [name] Other Clinician: Referring Genene Kilman: Treating Caydan Mctavish/Extender:Robson, Vista Lawman, Moses Manners in Treatment: 0 Primary Learner Assessed: Patient Learning Preferences/Education Level/Primary Language Learning Preference: Explanation Highest Education Level: College or Above Preferred Language: English Cognitive Barrier Language Barrier: No Translator Needed: No Memory Deficit: No Emotional Barrier: No Physical Barrier Impaired Vision: Yes Glasses Impaired Hearing: No Decreased Hand dexterity: No Knowledge/Comprehension Knowledge Level: Medium Comprehension Level: High Ability to understand written High instructions: Ability to understand verbal High instructions: Motivation Anxiety Level: Calm Cooperation: Cooperative Education Importance: Acknowledges Need Interest in Health Problems: Asks Questions Perception: Coherent Willingness to Engage in Self- High Management Activities: Readiness to Engage in Self- High Management Activities: Electronic Signature(s) Signed: 10/20/2019 2:56:39 PM By: Carlene Coria RN Entered By: Carlene Coria on 10/12/2019 11:08:45 -------------------------------------------------------------------------------- Fall Risk Assessment Details Patient Name: Date of Service: Annette Mallet C. 10/12/2019 10:30 AM Medical Record [id number] Patient Account Number: [id number] Date of Birth/Sex: Treating RN: [birth year] (83 y.o. Orvan Falconer Primary Care Shiron Whetsel: [name] Other Clinician: Referring Dereona Kolodny: Treating Johnnette Laux/Extender:Robson,  Vista Lawman, Moses Manners in Treatment: 0 Fall Risk Assessment Items Have you had 2 or more falls in the last 12 monthso 0 No Have you had any fall that resulted in injury in the last 12 monthso 0 No FALLS RISK SCREEN History of falling - immediate or within 3 months 0 No Secondary diagnosis (Do you have 2 or more medical diagnoseso) 0 No Ambulatory aid None/bed rest/wheelchair/nurse 0 No Crutches/cane/walker 0 No Furniture 0 No Intravenous therapy Access/Saline/Heparin Lock 0 No Weak (short steps with or without shuffle, stooped but able to lift head 0 No while walking, may seek support from furniture) Impaired (short steps with shuffle, may have  difficulty arising from chair, 0 No head down, impaired balance) Mental Status Oriented to own ability 0 No Overestimates or forgets limitations 0 No Risk Level: Low Risk Score: 0 Electronic Signature(s) Signed: 10/20/2019 2:56:39 PM By: Carlene Coria RN Entered By: Carlene Coria on 10/12/2019 11:09:19 -------------------------------------------------------------------------------- Foot Assessment Details Patient Name: Date of Service: Annette Mallet C. 10/12/2019 10:30 AM Medical Record [id number] Patient Account Number: [id number] Date of Birth/Sex: Treating RN: Apr 30, 1936 (83 y.o. Orvan Falconer Primary Care Anna Livers: [name] Other Clinician: Referring Cambrey Lupi: Treating Rylyn Zawistowski/Extender:Robson, Vista Lawman, Moses Manners in Treatment: 0 Foot Assessment Items Site Locations + = Sensation present, - = Sensation absent, C = Callus, U = Ulcer R = Redness, W = Warmth, M = Maceration, PU = Pre-ulcerative lesion F = Fissure, S = Swelling, D = Dryness Assessment Right: Left: Other Deformity: No No Prior Foot Ulcer: No No Prior Amputation: No No Charcot Joint: No No Ambulatory Status: Ambulatory Without Help Gait: Steady Electronic Signature(s) Signed: 10/20/2019 2:56:39 PM By: Carlene Coria RN Entered By: Carlene Coria on 10/12/2019 11:12:53 -------------------------------------------------------------------------------- Nutrition Risk Screening Details Patient Name: Date of Service: [name] 10/12/2019 10:30 AM Medical Record [id number] Patient Account Number: [id number] Date of Birth/Sex: Treating RN: January 18, 1936 (83 y.o. Orvan Falconer Primary Care Jeronica Stlouis: [name] Other Clinician: Referring Merion Grimaldo: Treating Manessa Buley/Extender:Robson, Vista Lawman, Moses Manners in Treatment: 0 Height (in): 58 Weight (lbs): 111 Body Mass Index (BMI): 23.2 Nutrition Risk Screening Items Score Screening NUTRITION RISK SCREEN: I have an illness or condition that made me change the kind and/or 0 No amount of food I eat I eat fewer than two meals per day 0 No I eat few fruits and vegetables, or milk products 0 No I have three or more drinks of beer, liquor or wine almost every day 0 No I have tooth or mouth problems that make it hard for me to eat 0 No I don't always have enough money to buy the food I need 0 No I eat alone most of the time 0 No I take three or more different prescribed or over-the-counter drugs a day 1 Yes 0 No Without wanting to, I have lost or gained 10 pounds in the last six months I am not always physically able to shop, cook and/or feed myself 2 Yes Nutrition Protocols Good Risk Protocol Provide education on Moderate Risk Protocol 0 nutrition High Risk Proctocol Risk Level: Moderate Risk Score: 3 Electronic Signature(s) Signed: 10/20/2019 2:56:39 PM By: Carlene Coria RN Entered By: Carlene Coria on 10/12/2019 11:09:33
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
## 2019-10-20 NOTE — Progress Notes (Signed)
[name], [name] ([id number]) Visit Report for 10/12/2019 Allergy List Details Patient Name: Date of Service: [name], [name] 10/12/2019 10:30 AM Medical Record [id number] Patient Account Number: [id number] Date of Birth/Sex: Treating RN: Jun 13, 1936 (83 y.o. Orvan Falconer Primary Care Amayah Staheli: [name] Other Clinician: Referring Shanece Cochrane: Treating Almus Woodham/Extender:Robson, Vista Lawman, Moses Manners in Treatment: 0 Allergies Active Allergies Iodinated Contrast Media Severity: Moderate Mucinex Zanaflex Severity: Moderate Allergy Notes Electronic Signature(s) Signed: 10/20/2019 2:56:39 PM By: Carlene Coria RN Entered By: Carlene Coria on 10/12/2019 11:03:02 -------------------------------------------------------------------------------- Arrival Information Details Patient Name: Date of Service: [name] Annette C. 10/12/2019 10:30 AM Medical Record [id number] Patient Account Number: [id number] Date of Birth/Sex: Treating RN: [birth year] (83 y.o. Orvan Falconer Primary Care Leoma Folds: [name] Other Clinician: Referring Ellizabeth Dacruz: Treating Navpreet Szczygiel/Extender:Robson, Vista Lawman, Moses Manners in Treatment: 0 Visit Information Patient Arrived: [name] Arrival Time: 10:57 Accompanied By: self Transfer Assistance: None Patient Identification Verified: Yes Secondary Verification Process Yes Completed: Patient Has Alerts: Yes Patient Alerts: Patient on Blood Thinner Electronic Signature(s) Signed: 10/20/2019 2:56:39 PM By: Carlene Coria RN Entered By: Carlene Coria on 10/12/2019 10:57:42 -------------------------------------------------------------------------------- Clinic Level of Care Assessment Details Patient Name: Date of Service: [name], [name] 10/12/2019 10:30 AM Medical Record [id number] Patient Account Number: [id number] Date of Birth/Sex: Treating RN: [birth year] (83 y.o. [name] Primary Care Gracee Ratterree: [name] Other Clinician: Referring Betzabe Bevans: Treating Dunia Pringle/Extender:Robson, Vista Lawman, Moses Manners in Treatment: 0 Clinic Level of Care Assessment Items TOOL 1 Quantity Score X - Use when EandM and Procedure is performed on INITIAL visit 1 0 ASSESSMENTS - Nursing Assessment / Reassessment X - General Physical Exam (combine w/ comprehensive assessment (listed just below) 1 20 when performed on new pt. evals) X - Comprehensive Assessment (HX, ROS, Risk Assessments, Wounds Hx, etc.) 1 25 ASSESSMENTS - Wound and Skin Assessment / Reassessment []  - Dermatologic / Skin Assessment (not related to wound area) 0 ASSESSMENTS - Ostomy and/or Continence Assessment and Care []  - Incontinence Assessment and Management 0 []  - Ostomy Care Assessment and Management (repouching, etc.) 0 PROCESS - Coordination of Care X - Simple Patient / Family Education for ongoing care 1 15 []  - Complex (extensive) Patient / Family Education for ongoing care 0 X - Staff obtains Programmer, systems, Records, Test Results / Process Orders 1 10 []  - Staff telephones HHA, Nursing Homes / Clarify orders / etc 0 []  - Routine Transfer to another Facility (non-emergent condition) 0 []  - Routine Hospital Admission (non-emergent condition) 0 X - New Admissions / Biomedical engineer / Ordering NPWT, Apligraf, etc. 1 15 []  - Emergency Hospital Admission (emergent condition) 0 PROCESS - Special Needs []  - Pediatric / Minor Patient Management 0 []  - Isolation Patient Management 0 []  - Hearing / Language / Visual special needs 0 []  - Assessment of Community assistance (transportation, D/C planning, etc.) 0 []  - Additional assistance / Altered mentation 0 []  - Support Surface(s) Assessment (bed, cushion, seat, etc.) 0 INTERVENTIONS - Miscellaneous []  - External ear exam 0 []  - Patient Transfer (multiple staff / Civil Service fast streamer / Similar devices) 0 []  - Simple Staple / Suture removal (25 or less) 0 []  - Complex Staple / Suture  removal (26 or more) 0 []  - Hypo/Hyperglycemic Management (do not check if billed separately) 0 X - Ankle / Brachial Index (ABI) - do not check if billed separately 1 15 Has the patient been seen at the hospital within the last three years: Yes Total Score: 100 Level Of  Care: New/Established - Level 3 Electronic Signature(s) Signed: 10/12/2019 6:27:46 PM By: Levan Hurst RN, BSN Entered By: Levan Hurst on 10/12/2019 12:07:30 -------------------------------------------------------------------------------- Compression Therapy Details Patient Name: Date of Service: Annette Mallet C. 10/12/2019 10:30 AM Medical Record [id number] Patient Account Number: [id number] Date of Birth/Sex: Treating RN: 12-15-1935 (83 y.o. [name] Primary Care Chiquita Heckert: [name] Other Clinician: Referring Caitlan Chauca: Treating Jarrius Huaracha/Extender:Robson, Vista Lawman, Moses Manners in Treatment: 0 Compression Therapy Performed for Wound Wound #1 Right,Distal,Posterior Lower Leg Assessment: Performed By: Clinician Levan Hurst, RN Compression Type: Three Layer Post Procedure Diagnosis Same as Pre-procedure Electronic Signature(s) Signed: 10/12/2019 6:27:46 PM By: Levan Hurst RN, BSN Entered By: Levan Hurst on 10/12/2019 12:11:05 -------------------------------------------------------------------------------- Encounter Discharge Information Details Patient Name: Date of Service: [name] Annette C. 10/12/2019 10:30 AM Medical Record [id number] Patient Account Number: [id number] Date of Birth/Sex: Treating RN: 03-08-1936 (83 y.o. [name] Primary Care Tracina Beaumont: [name] Other Clinician: Referring Lorraine Cimmino: Treating Braxden Lovering/Extender:Robson, Vista Lawman, Moses Manners in Treatment: 0 Encounter Discharge Information Items Post Procedure Vitals Discharge Condition: Stable Temperature (F): 97.7 Ambulatory Status: Walker Pulse (bpm): 86 Discharge  Destination: Home Respiratory Rate (breaths/min): 18 Transportation: Private Auto Blood Pressure (mmHg): 152/82 Accompanied By: self Schedule Follow-up Appointment: Yes Clinical Summary of Care: Electronic Signature(s) Signed: 10/12/2019 6:19:57 PM By: Deon Pilling Entered By: Deon Pilling on 10/12/2019 12:19:44 -------------------------------------------------------------------------------- Lower Extremity Assessment Details Patient Name: Date of Service: [name], [name] 10/12/2019 10:30 AM Medical Record [id number] Patient Account Number: [id number] Date of Birth/Sex: Treating RN: [birth year] (83 y.o. Orvan Falconer Primary Care Adreanne Yono: [name] Other Clinician: Referring Greer Koeppen: Treating Kire Ferg/Extender:Robson, Vista Lawman, Moses Manners in Treatment: 0 Edema Assessment Assessed: [Left: No] [Right: No] E[Left: dema] [Right: :] Calf Left: Right: Point of Measurement: 37 cm From Medial Instep cm 35 cm Ankle Left: Right: Point of Measurement: 10 cm From Medial Instep cm 22 cm Vascular Assessment Pulses: Dorsalis Pedis Palpable: [Right:Yes] Blood Pressure: Brachial: [Right:152] Ankle: [Right:Dorsalis Pedis: 150 0.99] Electronic Signature(s) Signed: 10/20/2019 2:56:39 PM By: Carlene Coria RN Entered By: Carlene Coria on 10/12/2019 11:21:50 -------------------------------------------------------------------------------- Multi Wound Chart Details Patient Name: Date of Service: Annette Mallet C. 10/12/2019 10:30 AM Medical Record [id number] Patient Account Number: [id number] Date of Birth/Sex: Treating RN: Mar 02, 1936 (83 y.o. F) Primary Care Kalsey Lull: [name] Other Clinician: Referring Merian Wroe: Treating Ensley Blas/Extender:Robson, Vista Lawman, Moses Manners in Treatment: 0 Vital Signs Height(in): 35 Pulse(bpm): 80 Weight(lbs): 111 Blood Pressure(mmHg): 152/82 Body Mass Index(BMI): 23 Temperature(F): 97.7 Respiratory  18 Rate(breaths/min): Photos: [1:No Photos] [N/A:N/A] Wound Location: [1:Right Lower Leg - Posterior, Distal] [N/A:N/A] Wounding Event: [1:Trauma] [N/A:N/A] Primary Etiology: [1:Lymphedema] [N/A:N/A] Comorbid History: [1:Congestive Heart Failure, Hypertension, Received Radiation] [N/A:N/A] Date Acquired: [1:09/08/2019] [N/A:N/A] Weeks of Treatment: [1:0] [N/A:N/A] Wound Status: [1:Open] [N/A:N/A] Measurements L x W x D 2.5x2.4x0.1 [N/A:N/A] (cm) Area (cm) : [1:4.712] [N/A:N/A] Volume (cm) : [1:0.471] [N/A:N/A] % Reduction in Area: [1:0.00%] [N/A:N/A] % Reduction in Volume: [1:0.00%] [N/A:N/A N/A] Starting Position 1 [1:12] (o'clock): Ending Position 1 [1:12] (o'clock): Maximum Distance 1 [1:2.8] (cm): Undermining: [1:Yes] [N/A:N/A N/A] Classification: [1:Full Thickness Without Exposed Support Structures] [N/A:N/A N/A] Exudate Amount: [1:None Present] [N/A:N/A N/A] Wound Margin: [1:Flat and Intact] [N/A:N/A N/A] Granulation Amount: [1:Medium (34-66%)] [N/A:N/A N/A] Granulation Quality: [1:Red] [N/A:N/A N/A] Necrotic Amount: [1:Medium (34-66%)] [N/A:N/A N/A] Necrotic Tissue: [1:Eschar, Adherent Slough N/A] [N/A:N/A] Exposed Structures: [1:Fat Layer (Subcutaneous N/A Tissue) Exposed: Yes Fascia: No Tendon: No Muscle: No Joint: No Bone: No] [N/A:N/A] Epithelialization: [1:None] [N/A:N/A N/A] Debridement: [1:Debridement - Excisional N/A] [  N/A:N/A] Pre-procedure [1:12:00] [N/A:N/A N/A] Verification/Time Out Taken: Pain Control: [1:Other] [N/A:N/A N/A] Tissue Debrided: [1:Necrotic/Eschar, Subcutaneous] [N/A:N/A N/A] Level: [1:Skin/Subcutaneous Tissue] [N/A:N/A N/A] Debridement Area (sq cm):6 [N/A:N/A N/A] Instrument: [1:Curette] [N/A:N/A N/A] Bleeding: [1:Minimum] [N/A:N/A N/A] Hemostasis Achieved: [1:Pressure] [N/A:N/A N/A] Procedural Pain: [1:3] [N/A:N/A N/A] Post Procedural Pain: [1:0] [N/A:N/A N/A] Debridement Treatment Procedure was tolerated [N/A:N/A N/A] Response:  [1:well] Post Debridement [1:2.5x2.4x0.1] [N/A:N/A N/A] Measurements L x W x D (cm) Post Debridement [1:0.471] [N/A:N/A N/A] Volume: (cm) Procedures Performed: Compression Therapy [1:Debridement] [N/A:N/A N/A] Treatment Notes Wound #1 (Right, Distal, Posterior Lower Leg) 1. Cleanse With Wound Cleanser Soap and water 2. Periwound Care Moisturizing lotion 3. Primary Dressing Applied Calcium Alginate Ag 4. Secondary Dressing ABD Pad 6. Support Layer Applied 3 layer compression wrap Notes netting. explained the orders, dressings, frequency of change, and when to return to wound center. Electronic Signature(s) Signed: 10/12/2019 6:46:53 PM By: Linton Ham MD Entered By: Linton Ham on 10/12/2019 12:27:31 -------------------------------------------------------------------------------- Multi-Disciplinary Care Plan Details Patient Name: Date of Service: [name] Annette C. 10/12/2019 10:30 AM Medical Record [id number] Patient Account Number: [id number] Date of Birth/Sex: Treating RN: 18-Aug-1936 (83 y.o. [name] Primary Care Jonai Weyland: [name] Other Clinician: Referring Marvis Bakken: Treating Anahita Cua/Extender:Robson, Vista Lawman, Moses Manners in Treatment: 0 Active Inactive Abuse / Safety / Falls / Self Care Management Nursing Diagnoses: Potential for falls Potential for injury related to falls Goals: Patient will not experience any injury related to falls Date Initiated: 10/12/2019 Target Resolution Date: 11/13/2019 Goal Status: Active Patient/caregiver will verbalize understanding of skin care regimen Date Initiated: 10/12/2019 Target Resolution Date: 11/13/2019 Goal Status: Active Patient/caregiver will verbalize/demonstrate measures taken to prevent injury and/or falls Date Initiated: 10/12/2019 Target Resolution Date: 11/13/2019 Goal Status: Active Interventions: Assess Activities of Daily Living upon admission and as needed Assess fall  risk on admission and as needed Assess: immobility, friction, shearing, incontinence upon admission and as needed Assess impairment of mobility on admission and as needed per policy Assess personal safety and home safety (as indicated) on admission and as needed Assess self care needs on admission and as needed Provide education on fall prevention Provide education on personal and home safety Notes: Wound/Skin Impairment Nursing Diagnoses: Impaired tissue integrity Knowledge deficit related to ulceration/compromised skin integrity Goals: Patient/caregiver will verbalize understanding of skin care regimen Date Initiated: 10/12/2019 Target Resolution Date: 11/13/2019 Goal Status: Active Ulcer/skin breakdown will have a volume reduction of 30% by week 4 Date Initiated: 10/12/2019 Target Resolution Date: 11/13/2019 Goal Status: Active Interventions: Assess patient/caregiver ability to obtain necessary supplies Assess patient/caregiver ability to perform ulcer/skin care regimen upon admission and as needed Assess ulceration(s) every visit Provide education on ulcer and skin care Notes: Electronic Signature(s) Signed: 10/12/2019 6:27:46 PM By: Levan Hurst RN, BSN Entered By: Levan Hurst on 10/12/2019 12:06:56 -------------------------------------------------------------------------------- Pain Assessment Details Patient Name: Date of Service: Annette Mallet C. 10/12/2019 10:30 AM Medical Record [id number] Patient Account Number: [id number] Date of Birth/Sex: Treating RN: [birth year] (83 y.o. Orvan Falconer Primary Care Mychal Durio: [name] Other Clinician: Referring Adriana Quinby: Treating Alba Perillo/Extender:Robson, Vista Lawman, Moses Manners in Treatment: 0 Active Problems Location of Pain Severity and Description of Pain Patient Has Paino Yes Site Locations With Dressing Change: Yes With Dressing Change: Yes Duration of the Pain. Constant / Intermittento  Intermittent How Long Does it Lasto Hours: Minutes: 15 Rate the pain. Current Pain Level: 4 Worst Pain Level: 7 Least Pain Level: 0 Tolerable Pain Level: 5 Character of Pain Describe the Pain: Other: hurtin  Pain Management and Medication Current Pain Management: Medication: Yes Cold Application: No Rest: Yes Massage: No Activity: No T.E.N.S.: No Heat Application: No Leg drop or elevation: No Is the Current Pain Management Adequate: Inadequate How does your wound impact your activities of daily livingo Sleep: No Bathing: No Appetite: No Relationship With Others: No Bladder Continence: No Emotions: No Bowel Continence: No Work: No Toileting: No Drive: No Dressing: No Hobbies: No Electronic Signature(s) Signed: 10/20/2019 2:56:39 PM By: Carlene Coria RN Entered By: Carlene Coria on 10/12/2019 11:31:57 -------------------------------------------------------------------------------- Patient/Caregiver Education Details [name], Annette Annette 11/30/2020andnbsp10:30 Patient Name: Date of Service: C. AM Medical Record Patient Account Number: [id number] Number: Treating RN: Levan Hurst Date of Birth/Gender: September 04, 1936 (83 y.o. F) Other Clinician: Primary Care Physician:Griffin, John Treating Linton Ham Referring Physician: Physician/Extender: Lavena Bullion in Treatment: 0 Education Assessment Education Provided To: Patient Education Topics Provided Wound/Skin Impairment: Methods: Explain/Verbal Responses: State content correctly Electronic Signature(s) Signed: 10/12/2019 6:27:46 PM By: Levan Hurst RN, BSN Entered By: Levan Hurst on 10/12/2019 12:07:05 -------------------------------------------------------------------------------- Wound Assessment Details Patient Name: Date of Service: Annette Mallet C. 10/12/2019 10:30 AM Medical Record [id number] Patient Account Number: [id number] Date of Birth/Sex: Treating RN: February 17, 1936  (83 y.o. Orvan Falconer Primary Care Zaryah Seckel: [name] Other Clinician: Referring Illianna Paschal: Treating Clovis Warwick/Extender:Robson, Vista Lawman, Moses Manners in Treatment: 0 Wound Status Wound Number: 1 Primary Lymphedema Etiology: Wound Location: Right Lower Leg - Posterior, Distal Wound Open Wounding Event: Trauma Status: Date Acquired: 09/08/2019 Comorbid Congestive Heart Failure, Hypertension, Weeks Of Treatment: 0 History: Received Radiation Clustered Wound: No Photos Wound Measurements Length: (cm) 2.5 Width: (cm) 2.4 Depth: (cm) 0.1 Area: (cm) 4.712 Volume: (cm) 0.471 % Reduction in Area: 0% % Reduction in Volume: 0% Epithelialization: None Tunneling: No Undermining: Yes Starting Position (o'clock): 12 Ending Position (o'clock): 12 Maximum Distance: (cm) 2.8 Wound Description Classification: Full Thickness Without Exposed Support Foul O Structures Classification: Structures Slough Wound Flat and Intact Margin: Exudate None Present Amount: Wound Bed Granulation Amount: Medium (34-66%) Granulation Quality: Red Fascia Necrotic Amount: Medium (34-66%) Fat La Necrotic Quality: Eschar, Adherent Slough Tendon Muscle Joint Bone E dor After Cleansing: No /Fibrino Yes Exposed Structure Exposed: No yer (Subcutaneous Tissue) Exposed: Yes Exposed: No Exposed: No Exposed: No xposed: No Electronic Signature(s) Signed: 10/20/2019 2:54:39 PM By: Mikeal Hawthorne EMT/HBOT Signed: 10/20/2019 2:56:39 PM By: Carlene Coria RN Previous Signature: 10/12/2019 6:27:46 PM Version By: Levan Hurst RN, BSN Entered By: Mikeal Hawthorne on 10/19/2019 12:30:25 -------------------------------------------------------------------------------- Vitals Details Patient Name: Date of Service: [name], Annette Annette C. 10/12/2019 10:30 AM Medical Record [id number] Patient Account Number: [id number] Date of Birth/Sex: Treating RN: 1936-05-27 (83 y.o. Orvan Falconer Primary  Care Xandria Gallaga: [name] Other Clinician: Referring Naif Alabi: Treating Aarohi Redditt/Extender:Robson, Vista Lawman, Moses Manners in Treatment: 0 Vital Signs Time Taken: 10:58 Temperature (F): 97.7 Height (in): 58 Pulse (bpm): 86 Source: Stated Respiratory Rate (breaths/min): 18 Weight (lbs): 111 Blood Pressure (mmHg): 152/82 Source: Stated Reference Range: 80 - 120 mg / dl Body Mass Index (BMI): 23.2 Electronic Signature(s) Signed: 10/20/2019 2:56:39 PM By: Carlene Coria RN Entered By: Carlene Coria on 10/12/2019 11:01:04

## 2019-10-20 NOTE — Patient Instructions (Signed)
Medication Instructions:  Your physician recommends that you continue on your current medications as directed. Please refer to the Current Medication list given to you today.  *If you need a refill on your cardiac medications before your next appointment, please call your pharmacy*  Lab Work: None ordered  If you have labs (blood work) drawn today and your tests are completely normal, you will receive your results only by: Marland Kitchen MyChart Message (if you have MyChart) OR . A paper copy in the mail If you have any lab test that is abnormal or we need to change your treatment, we will call you to review the results.  Testing/Procedures: None ordered  Follow-Up: At [facility], you and your health needs are our priority.  As part of our continuing mission to provide you with exceptional heart care, we have created designated Provider Care Teams.  These Care Teams include your primary Cardiologist (physician) and Advanced Practice Providers (APPs -  Physician Assistants and Nurse Practitioners) who all work together to provide you with the care you need, when you need it.  Your next appointment:   You are scheduled to come in the office to see Melina Copa, PA-C, 01/22/2020 at 10:00.  Please arrive at 9:45 for registration
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
## 2019-10-21 NOTE — Progress Notes (Signed)
Annette, [name] ([id number]) Visit Report for 10/19/2019 Arrival Information Details Patient Name: Date of Service: Annette, [name] 10/19/2019 11:00 AM Medical Record [id number] Patient Account Number: [id number] Date of Birth/Sex: Treating RN: Dec 07, 1935 (83 y.o. [name] Primary Care Harmony Sandell: [name] Other Clinician: Referring Vega Withrow: Treating Lea Baine/Extender:Robson, Vista Lawman, Moses Manners in Treatment: 1 Visit Information History Since Last Visit Added or deleted any medications: No Patient Arrived: [name] Any new allergies or adverse reactions: No Arrival Time: 11:17 Had a fall or experienced change in No Accompanied By: self activities of daily living that may affect Transfer Assistance: None risk of falls: Patient Identification Verified: Yes Signs or symptoms of abuse/neglect since last No Secondary Verification Process Yes visito Completed: Hospitalized since last visit: No Patient Has Alerts: Yes Implantable device outside of the clinic excluding No Patient Alerts: Patient on Blood cellular tissue based products placed in the center Thinner since last visit: Has Dressing in Place as Prescribed: Yes Has Compression in Place as Prescribed: Yes Pain Present Now: No Electronic Signature(s) Signed: 10/21/2019 12:04:56 PM By: Kela Millin Entered By: Kela Millin on 10/19/2019 11:17:45 -------------------------------------------------------------------------------- Compression Therapy Details Patient Name: Date of Service: [name] C. 10/19/2019 11:00 AM Medical Record [id number] Patient Account Number: [id number] Date of Birth/Sex: Treating RN: 11-Feb-1936 (83 y.o. [name] Primary Care Mamta Rimmer: [name] Other Clinician: Referring Benz Vandenberghe: Treating Kjirsten Bloodgood/Extender:Robson, Vista Lawman, Moses Manners in Treatment: 1 Compression Therapy Performed for Wound Wound #1  Right,Distal,Posterior Lower Leg Assessment: Performed By: Clinician Levan Hurst, RN Compression Type: Three Layer Post Procedure Diagnosis Same as Pre-procedure Electronic Signature(s) Signed: 10/19/2019 5:51:44 PM By: Levan Hurst RN, BSN Entered By: Levan Hurst on 10/19/2019 11:47:51 -------------------------------------------------------------------------------- Encounter Discharge Information Details Patient Name: Date of Service: Annette Blinks [name] 10/19/2019 11:00 AM Medical Record [id number] Patient Account Number: [id number] Date of Birth/Sex: Treating RN: Aug 09, 1936 (83 y.o. [name], [name] Primary Care Simya Tercero: [name] Other Clinician: Referring Jaylene Schrom: Treating Deantae Shackleton/Extender:Robson, Vista Lawman, Moses Manners in Treatment: 1 Encounter Discharge Information Items Discharge Condition: Stable Ambulatory Status: Walker Discharge Destination: Home Transportation: Private Auto Accompanied By: self Schedule Follow-up Appointment: Yes Clinical Summary of Care: Electronic Signature(s) Signed: 10/19/2019 5:20:01 PM By: Deon Pilling Entered By: Deon Pilling on 10/19/2019 12:03:44 -------------------------------------------------------------------------------- Lower Extremity Assessment Details Patient Name: Date of Service: Annette, [name] 10/19/2019 11:00 AM Medical Record [id number] Patient Account Number: [id number] Date of Birth/Sex: Treating RN: 03-15-1936 (83 y.o. [name] Primary Care Vitaliy Eisenhour: [name] Other Clinician: Referring [name]: Treating Jenelle Drennon/Extender:Robson, Vista Lawman, Moses Manners in Treatment: 1 Edema Assessment Assessed: [Left: No] [Right: No] Edema: [Left: Ye] [Right: s] Calf Left: Right: Point of Measurement: 37 cm From Medial Instep cm 33 cm Ankle Left: Right: Point of Measurement: 10 cm From Medial Instep cm 21.5 cm Vascular Assessment Pulses: Dorsalis Pedis Palpable:  [Right:Yes] Electronic Signature(s) Signed: 10/21/2019 12:04:56 PM By: Kela Millin Entered By: Kela Millin on 10/19/2019 11:24:36 -------------------------------------------------------------------------------- Multi Wound Chart Details Patient Name: Date of Service: [name] C. 10/19/2019 11:00 AM Medical Record [id number] Patient Account Number: [id number] Date of Birth/Sex: Treating RN: 1935/12/04 (83 y.o. [name] Primary Care Mercie Balsley: [name] Other Clinician: Referring Reannah Totten: Treating Mamadou Breon/Extender:Robson, Vista Lawman, Moses Manners in Treatment: 1 Vital Signs Height(in): 58 Pulse(bpm): 77 Weight(lbs): 111 Blood Pressure(mmHg): 132/66 Body Mass Index(BMI): 23 Temperature(F): 98.1 Respiratory 18 Rate(breaths/min): Photos: [1:No Photos] [N/A:N/A] Wound Location: [1:Right Lower Leg - Posterior, Distal] [N/A:N/A] Wounding Event: [1:Trauma] [N/A:N/A] Primary Etiology: [1:Lymphedema] [N/A:N/A]  Comorbid History: [1:Congestive Heart Failure, Hypertension, Received Radiation] [N/A:N/A] Date Acquired: [1:09/08/2019] [N/A:N/A] Weeks of Treatment: [1:1] [N/A:N/A] Wound Status: [1:Open] [N/A:N/A] Measurements L x W x D 2.2x2.1x0.7 [N/A:N/A] (cm) Area (cm) : [1:3.629] [N/A:N/A] Volume (cm) : [1:2.54] [N/A:N/A] % Reduction in Area: [1:23.00%] [N/A:N/A] % Reduction in Volume: [1:-439.30%] [N/A:N/A] Starting Position 1 [1:8] (o'clock): Ending Position 1 [1:9] (o'clock): Maximum Distance 1 [1:4] (cm): Undermining: [1:Yes] [N/A:N/A] Classification: [1:Full Thickness Without Exposed Support Structures] [N/A:N/A] Exudate Amount: [1:Medium] [N/A:N/A] Exudate Type: [1:Purulent] [N/A:N/A] Exudate Color: [1:yellow, brown, green] [N/A:N/A] Wound Margin: [1:Well defined, not attached N/A] Granulation Amount: [1:Medium (34-66%)] [N/A:N/A] Granulation Quality: [1:Red] [N/A:N/A] Necrotic Amount: [1:Medium (34-66%)] [N/A:N/A] Exposed  Structures: [1:Fat Layer (Subcutaneous N/A Tissue) Exposed: Yes Fascia: No Tendon: No Muscle: No Joint: No Bone: No] Epithelialization: [1:None] [N/A:N/A] Procedures Performed: [1:Chemical Cauterization Compression Therapy] [N/A:N/A] Treatment Notes Wound #1 (Right, Distal, Posterior Lower Leg) 1. Cleanse With Wound Cleanser Soap and water 2. Periwound Care Moisturizing lotion 3. Primary Dressing Applied Calcium Alginate Ag 4. Secondary Dressing Dry Gauze 6. Support Layer Applied 3 layer compression wrap Notes netting. Electronic Signature(s) Signed: 10/19/2019 5:51:44 PM By: Levan Hurst RN, BSN Signed: 10/20/2019 10:01:17 AM By: Linton Ham MD Entered By: Linton Ham on 10/19/2019 12:11:31 -------------------------------------------------------------------------------- Multi-Disciplinary Care Plan Details Patient Name: Date of Service: Annette Blinks [name] 10/19/2019 11:00 AM Medical Record [id number] Patient Account Number: [id number] Date of Birth/Sex: Treating RN: 15-Aug-1936 (83 y.o. [name] Primary Care Nely Dedmon: [name] Other Clinician: Referring Aaditya Letizia: Treating Genea Rheaume/Extender:Robson, Vista Lawman, Moses Manners in Treatment: 1 Active Inactive Abuse / Safety / Falls / Self Care Management Nursing Diagnoses: Potential for falls Potential for injury related to falls Goals: Patient will not experience any injury related to falls Date Initiated: 10/12/2019 Target Resolution Date: 11/13/2019 Goal Status: Active Patient/caregiver will verbalize understanding of skin care regimen Date Initiated: 10/12/2019 Target Resolution Date: 11/13/2019 Goal Status: Active Patient/caregiver will verbalize/demonstrate measures taken to prevent injury and/or falls Date Initiated: 10/12/2019 Target Resolution Date: 11/13/2019 Goal Status: Active Interventions: Assess Activities of Daily Living upon admission and as needed Assess fall risk on  admission and as needed Assess: immobility, friction, shearing, incontinence upon admission and as needed Assess impairment of mobility on admission and as needed per policy Assess personal safety and home safety (as indicated) on admission and as needed Assess self care needs on admission and as needed Provide education on fall prevention Provide education on personal and home safety Notes: Wound/Skin Impairment Nursing Diagnoses: Impaired tissue integrity Knowledge deficit related to ulceration/compromised skin integrity Goals: Patient/caregiver will verbalize understanding of skin care regimen Date Initiated: 10/12/2019 Target Resolution Date: 11/13/2019 Goal Status: Active Ulcer/skin breakdown will have a volume reduction of 30% by week 4 Date Initiated: 10/12/2019 Target Resolution Date: 11/13/2019 Goal Status: Active Interventions: Assess patient/caregiver ability to obtain necessary supplies Assess patient/caregiver ability to perform ulcer/skin care regimen upon admission and as needed Assess ulceration(s) every visit Provide education on ulcer and skin care Notes: Electronic Signature(s) Signed: 10/19/2019 5:51:44 PM By: Levan Hurst RN, BSN Entered By: Levan Hurst on 10/19/2019 11:37:00 -------------------------------------------------------------------------------- Pain Assessment Details Patient Name: Date of Service: [name] C. 10/19/2019 11:00 AM Medical Record [id number] Patient Account Number: [id number] Date of Birth/Sex: Treating RN: Apr 07, 1936 (83 y.o. [name] Primary Care Dade Rodin: [name] Other Clinician: Referring Iliya Spivack: Treating Chandria Rookstool/Extender:Robson, Vista Lawman, Moses Manners in Treatment: 1 Active Problems Location of Pain Severity and Description of Pain Patient Has Paino No Site Locations Pain Management and  Medication Current Pain Management: Electronic Signature(s) Signed: 10/21/2019 12:04:56 PM By:  Kela Millin Entered By: Kela Millin on 10/19/2019 11:23:57 -------------------------------------------------------------------------------- Patient/Caregiver Education Details Patient Name: Date of Service: [name] 12/7/2020andnbsp11:00 AM Medical Record Patient Account Number: [id number] Number: Treating RN: Levan Hurst Date of Birth/Gender: May 13, 1936 (83 y.o. F) Other Clinician: Primary Care Physician: Lyanne Co Referring Physician: Physician/Extender: Lavena Bullion in Treatment: 1 Education Assessment Education Provided To: Patient Education Topics Provided Wound/Skin Impairment: Methods: Explain/Verbal Responses: State content correctly Electronic Signature(s) Signed: 10/19/2019 5:51:44 PM By: Levan Hurst RN, BSN Entered By: Levan Hurst on 10/19/2019 11:37:12 -------------------------------------------------------------------------------- Wound Assessment Details Patient Name: Date of Service: [name] C. 10/19/2019 11:00 AM Medical Record [id number] Patient Account Number: [id number] Date of Birth/Sex: Treating RN: Oct 14, 1936 (83 y.o. [name] Primary Care Michell Kader: [name] Other Clinician: Referring Jasmina Gendron: Treating Johnsie Moscoso/Extender:Robson, Vista Lawman, Moses Manners in Treatment: 1 Wound Status Wound Number: 1 Primary Lymphedema Etiology: Wound Location: Right Lower Leg - Posterior, Distal Wound Open Wounding Event: Trauma Status: Date Acquired: 09/08/2019 Comorbid Congestive Heart Failure, Hypertension, Weeks Of Treatment: 1 History: Received Radiation Clustered Wound: No Photos Wound Measurements Length: (cm) 2.2 Width: (cm) 2.1 Depth: (cm) 0.7 Area: (cm) 3.629 Volume: (cm) 2.54 % Reduction in Area: 23% % Reduction in Volume: -439.3% Epithelialization: None Tunneling: No Undermining: Yes Starting Position (o'clock): 8 Ending  Position (o'clock): 9 Maximum Distance: (cm) 4 Wound Description Classification: Full Thickness Without Exposed Support Foul Odo Structures Slough/F Wound Well defined, not attached Margin: Exudate Medium Amount: Exudate Purulent Type: Exudate yellow, brown, green Color: Wound Bed Granulation Amount: Medium (34-66%) Granulation Quality: Red Fascia E Necrotic Amount: Medium (34-66%) Fat Laye Necrotic Quality: Adherent Slough Tendon E Muscle E Joint Ex Bone Exp r After Cleansing: No ibrino Yes Exposed Structure xposed: No r (Subcutaneous Tissue) Exposed: Yes xposed: No xposed: No posed: No osed: No Treatment Notes Wound #1 (Right, Distal, Posterior Lower Leg) 1. Cleanse With Wound Cleanser Soap and water 2. Periwound Care Moisturizing lotion 3. Primary Dressing Applied Calcium Alginate Ag 4. Secondary Dressing Dry Gauze 6. Support Layer Applied 3 layer compression wrap Notes netting. Electronic Signature(s) Signed: 10/20/2019 4:34:42 PM By: Mikeal Hawthorne EMT/HBOT Signed: 10/21/2019 12:04:56 PM By: Kela Millin Previous Signature: 10/19/2019 5:51:44 PM Version By: Levan Hurst RN, BSN Entered By: Mikeal Hawthorne on 10/20/2019 14:03:41 -------------------------------------------------------------------------------- Vitals Details Patient Name: Date of Service: [name], [name] 10/19/2019 11:00 AM Medical Record [id number] Patient Account Number: [id number] Date of Birth/Sex: Treating RN: [birth year] (83 y.o. [name] Primary Care Kienna Moncada: [name] Other Clinician: Referring Jax Abdelrahman: Treating Querida Beretta/Extender:Robson, Vista Lawman, Moses Manners in Treatment: 1 Vital Signs Time Taken: 11:15 Temperature (F): 98.1 Height (in): 58 Pulse (bpm): 61 Weight (lbs): 111 Respiratory Rate (breaths/min): 18 Body Mass Index (BMI): 23.2 Blood Pressure (mmHg): 132/66 Reference Range: 80 - 120 mg / dl Electronic  Signature(s) Signed: 10/21/2019 12:04:56 PM By: Kela Millin Entered By: Kela Millin on 10/19/2019 11:18:33

## 2019-10-26 ENCOUNTER — Other Ambulatory Visit: Payer: Self-pay

## 2019-10-26 ENCOUNTER — Encounter (HOSPITAL_BASED_OUTPATIENT_CLINIC_OR_DEPARTMENT_OTHER): Payer: Medicare Other | Admitting: Internal Medicine

## 2019-10-26 ENCOUNTER — Other Ambulatory Visit (HOSPITAL_COMMUNITY)
Admission: RE | Admit: 2019-10-26 | Discharge: 2019-10-26 | Disposition: A | Discharge status: Home / Self Care | Payer: Medicare Other | Source: Other Acute Inpatient Hospital | Attending: Internal Medicine | Admitting: Internal Medicine

## 2019-10-26 DIAGNOSIS — S81801A Unspecified open wound, right lower leg, initial encounter: Secondary | ICD-10-CM | POA: Diagnosis not present

## 2019-10-26 DIAGNOSIS — L97212 Non-pressure chronic ulcer of right calf with fat layer exposed: Secondary | ICD-10-CM | POA: Insufficient documentation

## 2019-10-26 NOTE — Progress Notes (Addendum)
AILETH, FOO ([id number]) Visit Report for 10/26/2019 Allergy List Details Patient Name: Date of Service: [name], [name] 10/26/2019 12:45 PM Medical Record [id number] Patient Account Number: [id number] Date of Birth/Sex: Treating RN: 07-28-1936 (83 y.o. [name] Primary Care Apphia Cropley: [name] Other Clinician: Referring Fiorella Hanahan: Treating Taiga Lupinacci/Extender:Robson, Vista Lawman, Moses Manners in Treatment: 2 Allergies Active Allergies Iodinated Contrast Media Severity: Moderate Mucinex Zanaflex Severity: Moderate penicillin Severity: Moderate Allergy Notes Electronic Signature(s) Signed: 10/26/2019 6:02:21 PM By: Levan Hurst RN, BSN Entered By: Levan Hurst on 10/26/2019 13:42:24 -------------------------------------------------------------------------------- Arrival Information Details Patient Name: Date of Service: [name]. 10/26/2019 12:45 PM Medical Record [id number] Patient Account Number: [id number] Date of Birth/Sex: Treating RN: 1936/03/23 (83 y.o. [name] Primary Care Marylon Verno: [name] Other Clinician: Referring Georgetta Crafton: Treating Guerino Caporale/Extender:Robson, Vista Lawman, Moses Manners in Treatment: 2 Visit Information Patient Arrived: Walker Arrival Time: 12:59 Accompanied By: self Transfer Assistance: None Transfer Assistance: None Patient Identification Verified: Yes Secondary Verification Process Yes Completed: Patient Has Alerts: Yes Patient Alerts: Patient on Blood Thinner History Since Last Visit Added or deleted any medications: No Any new allergies or adverse reactions: No Had a fall or experienced change in activities of daily living that may affect risk of falls: No Signs or symptoms of abuse/neglect since last visito No Hospitalized since last visit: No Implantable device outside of the clinic excluding cellular tissue based products placed in the center since last  visit: No Has Dressing in Place as Prescribed: Yes Has Compression in Place as Prescribed: Yes Pain Present Now: No Electronic Signature(s) Signed: 10/26/2019 5:56:42 PM By: Kela Millin Entered By: Kela Millin on 10/26/2019 13:00:27 -------------------------------------------------------------------------------- Compression Therapy Details Patient Name: Date of Service: [name], [name] 10/26/2019 12:45 PM Medical Record [id number] Patient Account Number: [id number] Date of Birth/Sex: Treating RN: 1935-11-23 (83 y.o. [name] Primary Care Lazar Tierce: [name] Other Clinician: Referring Terrianne Cavness: Treating Alyn Jurney/Extender:Robson, Vista Lawman, Moses Manners in Treatment: 2 Compression Therapy Performed for Wound Wound #1 Right,Distal,Posterior Lower Leg Assessment: Performed By: Clinician Levan Hurst, RN Compression Type: Three Layer Post Procedure Diagnosis Same as Pre-procedure Electronic Signature(s) Signed: 10/26/2019 6:02:21 PM By: Levan Hurst RN, BSN Entered By: Levan Hurst on 10/26/2019 13:44:07 -------------------------------------------------------------------------------- Encounter Discharge Information Details Patient Name: Date of Service: [name]. 10/26/2019 12:45 PM Medical Record [id number] Patient Account Number: [id number] Date of Birth/Sex: Treating RN: 1936-05-19 (83 y.o. [name], [name] Primary Care Cyntha Brickman: [name] Other Clinician: Referring Haywood Meinders: Treating Yaresly Menzel/Extender:Robson, Vista Lawman, Moses Manners in Treatment: 2 Encounter Discharge Information Items Discharge Condition: Stable Ambulatory Status: Walker Discharge Destination: Home Transportation: Private Auto Accompanied By: self Schedule Follow-up Appointment: Yes Clinical Summary of Care: Electronic Signature(s) Signed: 10/26/2019 5:58:58 PM By: Deon Pilling Entered By: Deon Pilling on 10/26/2019  13:53:59 -------------------------------------------------------------------------------- Lower Extremity Assessment Details Patient Name: Date of Service: [name], [name] 10/26/2019 12:45 PM Medical Record [id number] Patient Account Number: [id number] Date of Birth/Sex: Treating RN: September 12, 1936 (83 y.o. [name] Primary Care Donterrius Santucci: [name] Other Clinician: Referring Pernell Lenoir: Treating Bluma Buresh/Extender:Robson, Vista Lawman, Moses Manners in Treatment: 2 Edema Assessment Assessed: [Left: No] [Right: No] Edema: [Left: Ye] [Right: s] Calf Left: Right: Point of Measurement: 37 cm From Medial Instep cm 34 cm Ankle Left: Right: Point of Measurement: 10 cm From Medial Instep cm 21.5 cm Vascular Assessment Pulses: Dorsalis Pedis Palpable: [Right:Yes] Electronic Signature(s) Signed: 10/26/2019 5:56:42 PM By: Kela Millin Entered By: Kela Millin on 10/26/2019 13:07:54 -------------------------------------------------------------------------------- Multi Wound Chart  Details Patient Name: Date of Service: [name], [name] 10/26/2019 12:45 PM Medical Record [id number] Patient Account Number: [id number] Date of Birth/Sex: Treating RN: Mar 13, 1936 (83 y.o. [name] Primary Care Ivadell Gaul: [name] Other Clinician: Referring Hedwig Mcfall: Treating Princess Karnes/Extender:Robson, Vista Lawman, Moses Manners in Treatment: 2 Vital Signs Height(in): 17 Pulse(bpm): 60 Weight(lbs): 111 Blood Pressure(mmHg): 150/83 Body Mass Index(BMI): 23 Temperature(F): 97.9 Respiratory 18 Rate(breaths/min): Photos: [1:No Photos] [N/A:N/A] Wound Location: [1:Right Lower Leg - Posterior, N/A Distal] Wounding Event: [1:Trauma] [N/A:N/A] Primary Etiology: [1:Lymphedema] [N/A:N/A] Comorbid History: [1:Congestive Heart Failure, N/A Hypertension, Received Radiation] Date Acquired: [1:09/08/2019] [N/A:N/A] Weeks of Treatment: [1:2] [N/A:N/A] Wound  Status: [1:Open] [N/A:N/A] Measurements L x W x D 2.3x2.2x0.3 [N/A:N/A] (cm) Area (cm) : [1:3.974] [N/A:N/A] Volume (cm) : [1:1.192] [N/A:N/A] % Reduction in Area: [1:15.70%] [N/A:N/A] % Reduction in Volume: -153.10% [N/A:N/A] Position 1 (o'clock): 9 Maximum Distance 1 [1:3.6] (cm): Tunneling: [1:Yes] [N/A:N/A] Classification: [1:Full Thickness Without Exposed Support Structures] [N/A:N/A] Exudate Amount: [1:Medium] [N/A:N/A] Exudate Type: [1:Purulent] [N/A:N/A] Exudate Color: [1:yellow, brown, green] [N/A:N/A] Wound Margin: [1:Well defined, not attached N/A] Granulation Amount: [1:Medium (34-66%)] [N/A:N/A] Granulation Quality: [1:Red] [N/A:N/A] Necrotic Amount: [1:Medium (34-66%)] [N/A:N/A] Exposed Structures: [1:Fat Layer (Subcutaneous N/A Tissue) Exposed: Yes Fascia: No Tendon: No Muscle: No Joint: No Bone: No] Epithelialization: [1:None Compression Therapy] [N/A:N/A N/A] Treatment Notes Wound #1 (Right, Distal, Posterior Lower Leg) 1. Cleanse With Wound Cleanser Soap and water 2. Periwound Care Moisturizing lotion 3. Primary Dressing Applied Calcium Alginate Ag 4. Secondary Dressing ABD Pad Kerramax/Xtrasorb 6. Support Layer Applied 3 layer compression wrap Notes netting. Electronic Signature(s) Signed: 10/26/2019 5:56:50 PM By: Linton Ham MD Signed: 10/26/2019 6:02:21 PM By: Levan Hurst RN, BSN Entered By: Linton Ham on 10/26/2019 14:02:47 -------------------------------------------------------------------------------- Multi-Disciplinary Care Plan Details Patient Name: Date of Service: [name], [name] 10/26/2019 12:45 PM Medical Record [id number] Patient Account Number: [id number] Date of Birth/Sex: Treating RN: Mar 02, 1936 (83 y.o. [name] Primary Care Christipher Rieger: [name] Other Clinician: Referring Jestina Stephani: Treating Dorotha Hirschi/Extender:Robson, Vista Lawman, Moses Manners in Treatment: 2 Active Inactive Abuse / Safety  / Falls / Self Care Management Nursing Diagnoses: Potential for falls Potential for injury related to falls Goals: Patient will not experience any injury related to falls Date Initiated: 10/12/2019 Target Resolution Date: 11/13/2019 Goal Status: Active Patient/caregiver will verbalize understanding of skin care regimen Date Initiated: 10/12/2019 Target Resolution Date: 11/13/2019 Goal Status: Active Patient/caregiver will verbalize/demonstrate measures taken to prevent injury and/or falls Date Initiated: 10/12/2019 Target Resolution Date: 11/13/2019 Goal Status: Active Interventions: Assess Activities of Daily Living upon admission and as needed Assess fall risk on admission and as needed Assess: immobility, friction, shearing, incontinence upon admission and as needed Assess impairment of mobility on admission and as needed per policy Assess personal safety and home safety (as indicated) on admission and as needed Assess self care needs on admission and as needed Provide education on fall prevention Provide education on personal and home safety Notes: Wound/Skin Impairment Nursing Diagnoses: Impaired tissue integrity Knowledge deficit related to ulceration/compromised skin integrity Goals: Patient/caregiver will verbalize understanding of skin care regimen Date Initiated: 10/12/2019 Target Resolution Date: 11/13/2019 Goal Status: Active Ulcer/skin breakdown will have a volume reduction of 30% by week 4 Date Initiated: 10/12/2019 Target Resolution Date: 11/13/2019 Goal Status: Active Interventions: Assess patient/caregiver ability to obtain necessary supplies Assess patient/caregiver ability to perform ulcer/skin care regimen upon admission and as needed Assess ulceration(s) every visit Provide education on ulcer and skin care Notes: Electronic Signature(s) Signed: 10/26/2019 6:02:21 PM By: Donnal Debar  Briant Cedar RN, BSN Entered By: Levan Hurst on 10/26/2019  17:30:05 -------------------------------------------------------------------------------- Pain Assessment Details Patient Name: Date of Service: [name], [name] 10/26/2019 12:45 PM Medical Record [id number] Patient Account Number: [id number] Date of Birth/Sex: Treating RN: July 18, 1936 (83 y.o. [name] Primary Care Adaleena Mooers: [name] Other Clinician: Referring Pebbles Zeiders: Treating Harini Dearmond/Extender:Robson, Vista Lawman, Moses Manners in Treatment: 2 Active Problems Location of Pain Severity and Description of Pain Patient Has Paino No Site Locations Pain Management and Medication Current Pain Management: Electronic Signature(s) Signed: 10/26/2019 5:56:42 PM By: Kela Millin Entered By: Kela Millin on 10/26/2019 13:00:59 -------------------------------------------------------------------------------- Patient/Caregiver Education Details [name], [name] 12/14/2020andnbsp12:45 Patient Name: Date of Service: C. PM Medical Record Patient Account Number: [id number] Number: Treating RN: Levan Hurst Date of Birth/Gender: 1936-04-27 (83 y.o. F) Other Clinician: Primary Care Physician:Griffin, John Treating Linton Ham Referring Physician: Physician/Extender: Lavena Bullion in Treatment: 2 Education Assessment Education Provided To: Patient Education Topics Provided Wound/Skin Impairment: Methods: Explain/Verbal Responses: State content correctly Electronic Signature(s) Signed: 10/26/2019 6:02:21 PM By: Levan Hurst RN, BSN Entered By: Levan Hurst on 10/26/2019 17:30:17 -------------------------------------------------------------------------------- Wound Assessment Details Patient Name: Date of Service: [name] 10/26/2019 12:45 PM Medical Record [id number] Patient Account Number: [id number] Date of Birth/Sex: Treating RN: Nov 14, 1935 (83 y.o. [name] Primary Care Ada Woodbury:  [name] Other Clinician: Referring Zaiah Credeur: Treating Trannie Bardales/Extender:Robson, Vista Lawman, Moses Manners in Treatment: 2 Wound Status Wound Number: 1 Primary Lymphedema Etiology: Wound Location: Right Lower Leg - Posterior, Distal Wound Open Wounding Event: Trauma Status: Date Acquired: 09/08/2019 Comorbid Congestive Heart Failure, Hypertension, Weeks Of Treatment: 2 History: Received Radiation Clustered Wound: No Photos Wound Measurements Length: (cm) 2.3 Width: (cm) 2.2 Depth: (cm) 0.3 Area: (cm) 3.974 Volume: (cm) 1.192 % Reduction in Area: 15.7% % Reduction in Volume: -153.1% Epithelialization: None Tunneling: Yes Position (o'clock): 9 Maximum Distance: (cm) 3.6 Undermining: No Wound Description Full Thickness Without Exposed Support Foul O Classification: Structures Slough Wound Well defined, not attached Margin: Exudate Medium Amount: Exudate Purulent Type: Exudate yellow, brown, green Color: Wound Bed Granulation Amount: Medium (34-66%) Granulation Quality: Red Fascia Exp Necrotic Amount: Medium (34-66%) Fat Layer Necrotic Quality: Adherent Slough Tendon Exp Muscle Exp Joint Expo Bone Expos dor After Cleansing: No /Fibrino Yes Exposed Structure osed: No (Subcutaneous Tissue) Exposed: Yes osed: No osed: No sed: No ed: No Treatment Notes Wound #1 (Right, Distal, Posterior Lower Leg) 1. Cleanse With Wound Cleanser Soap and water 2. Periwound Care Moisturizing lotion 3. Primary Dressing Applied Calcium Alginate Ag 4. Secondary Dressing ABD Pad Kerramax/Xtrasorb 6. Support Layer Applied 3 layer compression wrap Notes netting. Electronic Signature(s) Signed: 10/27/2019 3:57:04 PM By: Mikeal Hawthorne EMT/HBOT Signed: 10/29/2019 5:33:40 PM By: Kela Millin Previous Signature: 10/26/2019 5:56:42 PM Version By: Kela Millin Entered By: Mikeal Hawthorne on 10/27/2019  13:56:23 -------------------------------------------------------------------------------- Vitals Details Patient Name: Date of Service: Annette Blinks [name] C. 10/26/2019 12:45 PM Medical Record [id number] Patient Account Number: [id number] Date of Birth/Sex: Treating RN: 02/25/1936 (83 y.o. [name] Primary Care Keelon Zurn: [name] Other Clinician: Referring Yovani Cogburn: Treating Henley Boettner/Extender:Robson, Vista Lawman, Moses Manners in Treatment: 2 Vital Signs Time Taken: 13:00 Temperature (F): 97.9 Height (in): 58 Pulse (bpm): 70 Weight (lbs): 111 Respiratory Rate (breaths/min): 18 Body Mass Index (BMI): 23.2 Blood Pressure (mmHg): 150/83 Reference Range: 80 - 120 mg / dl Electronic Signature(s) Signed: 10/26/2019 5:56:42 PM By: Kela Millin Entered By: Kela Millin on 10/26/2019 13:00:52
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
## 2019-10-28 LAB — AEROBIC CULTURE W GRAM STAIN (SUPERFICIAL SPECIMEN)

## 2019-11-02 ENCOUNTER — Other Ambulatory Visit: Payer: Self-pay

## 2019-11-02 ENCOUNTER — Encounter (HOSPITAL_BASED_OUTPATIENT_CLINIC_OR_DEPARTMENT_OTHER): Payer: Medicare Other | Admitting: Internal Medicine

## 2019-11-02 DIAGNOSIS — L97212 Non-pressure chronic ulcer of right calf with fat layer exposed: Secondary | ICD-10-CM | POA: Diagnosis not present
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
## 2019-11-04 NOTE — Progress Notes (Signed)
CHATOYA, STENGER ([id number]) Visit Report for 11/02/2019 Arrival Information Details Patient Name: Date of Service: [name], [name] 11/02/2019 11:15 AM Medical Record [id number] Patient Account Number: [id number] Date of Birth/Sex: Treating RN: January 21, 1936 (83 y.o. Orvan Falconer Primary Care Marshelle Bilger: [name] Other Clinician: Referring Iolani Twilley: Treating Griselle Rufer/Extender:Robson, Vista Lawman, Moses Manners in Treatment: 3 Visit Information History Since Last Visit All ordered tests and consults were completed: No Patient Arrived: [name] Added or deleted any medications: No Arrival Time: 11:32 Any new allergies or adverse reactions: No Accompanied By: self Had a fall or experienced change in No Transfer Assistance: None activities of daily living that may affect Patient Identification Verified: Yes risk of falls: Secondary Verification Process Yes Signs or symptoms of abuse/neglect since last No Completed: visito Patient Has Alerts: Yes Hospitalized since last visit: No Patient Alerts: Patient on Blood Implantable device outside of the clinic excluding No Thinner cellular tissue based products placed in the center since last visit: Has Dressing in Place as Prescribed: Yes Has Compression in Place as Prescribed: Yes Pain Present Now: No Electronic Signature(s) Signed: 11/04/2019 11:01:46 AM By: Carlene Coria RN Entered By: Carlene Coria on 11/02/2019 11:33:38 -------------------------------------------------------------------------------- Compression Therapy Details Patient Name: Date of Service: SIMISOLA, CHRISP 11/02/2019 11:15 AM Medical Record [id number] Patient Account Number: [id number] Date of Birth/Sex: Treating RN: [birth year] (83 y.o. Elam Dutch Primary Care Nataliyah Packham: [name] Other Clinician: Referring Aamna Mallozzi: Treating Johanna Stafford/Extender:Robson, Vista Lawman, Moses Manners in Treatment: 3 Compression Therapy  Performed for Wound Wound #1 Right,Distal,Posterior Lower Leg Assessment: Performed By: Clinician Deon Pilling, RN Compression Type: Three Layer Post Procedure Diagnosis Same as Pre-procedure Electronic Signature(s) Signed: 11/02/2019 5:05:42 PM By: Baruch Gouty RN, BSN Entered By: Baruch Gouty on 11/02/2019 12:22:13 -------------------------------------------------------------------------------- Encounter Discharge Information Details Patient Name: Date of Service: [name] 11/02/2019 11:15 AM Medical Record [id number] Patient Account Number: [id number] Date of Birth/Sex: Treating RN: 1936-07-04 (83 y.o. [name], [name] Primary Care Jedrek Dinovo: [name] Other Clinician: Referring Lavell Supple: Treating Veron Senner/Extender:Robson, Vista Lawman, Moses Manners in Treatment: 3 Encounter Discharge Information Items Discharge Condition: Stable Ambulatory Status: Walker Discharge Destination: Home Transportation: Private Auto Accompanied By: self Schedule Follow-up Appointment: Yes Clinical Summary of Care: Electronic Signature(s) Signed: 11/02/2019 5:06:44 PM By: Deon Pilling Entered By: Deon Pilling on 11/02/2019 14:03:36 -------------------------------------------------------------------------------- Lower Extremity Assessment Details Patient Name: Date of Service: LAKAIA, LESCH 11/02/2019 11:15 AM Medical Record [id number] Patient Account Number: [id number] Date of Birth/Sex: Treating RN: 06/07/1936 (83 y.o. Orvan Falconer Primary Care Carrolyn Hilmes: [name] Other Clinician: Referring Shayaan Parke: Treating Elantra Caprara/Extender:Robson, Vista Lawman, Moses Manners in Treatment: 3 Edema Assessment Assessed: [Left: No] [Right: No] Edema: [Left: Ye] [Right: s] Calf Left: Right: Point of Measurement: 37 cm From Medial Instep cm 31 cm Ankle Left: Right: Point of Measurement: 10 cm From Medial Instep cm 21 cm Electronic Signature(s) Signed:  11/04/2019 11:01:46 AM By: Carlene Coria RN Entered By: Carlene Coria on 11/02/2019 11:37:11 -------------------------------------------------------------------------------- Multi Wound Chart Details Patient Name: Date of Service: [name] 11/02/2019 11:15 AM Medical Record [id number] Patient Account Number: [id number] Date of Birth/Sex: Treating RN: [birth year] (83 y.o. F) Primary Care Orman Matsumura: [name] Other Clinician: Referring Keyly Baldonado: Treating Alaila Pillard/Extender:Robson, Vista Lawman, Moses Manners in Treatment: 3 Vital Signs Height(in): 58 Pulse(bpm): 59 Weight(lbs): 111 Blood Pressure(mmHg): 144/89 Body Mass Index(BMI): 23 Temperature(F): 98 Respiratory 18 Rate(breaths/min): Photos: [1:No Photos] [N/A:N/A] Wound Location: [1:Right Lower Leg - Posterior, Distal] [N/A:N/A] Wounding Event: [1:Trauma] [N/A:N/A] Primary Etiology: [1:Lymphedema] [  N/A:N/A] Comorbid History: [1:Congestive Heart Failure, Hypertension, Received Radiation] [N/A:N/A] Date Acquired: [1:09/08/2019] [N/A:N/A] Weeks of Treatment: [1:3] [N/A:N/A] Wound Status: [1:Open] [N/A:N/A] Measurements L x W x D 1.2x2x0.2 [N/A:N/A] (cm) Area (cm) : [1:1.885] [N/A:N/A] Volume (cm) : [1:0.377] [N/A:N/A] % Reduction in Area: [1:60.00%] [N/A:N/A] % Reduction in Volume: 20.00% [N/A:N/A] Position 1 (o'clock): 9 Maximum Distance 1 [1:2] (cm): Tunneling: [1:Yes] [N/A:N/A] Classification: [1:Full Thickness Without Exposed Support Structures] [N/A:N/A] Exudate Amount: [1:Medium] [N/A:N/A] Exudate Type: [1:Serosanguineous] [N/A:N/A] Exudate Color: [1:red, brown] [N/A:N/A] Wound Margin: [1:Well defined, not attached N/A] Granulation Amount: [1:Medium (34-66%)] [N/A:N/A] Granulation Quality: [1:Red] [N/A:N/A] Necrotic Amount: [1:Medium (34-66%)] [N/A:N/A] Exposed Structures: [1:Fat Layer (Subcutaneous N/A Tissue) Exposed: Yes Fascia: No Tendon: No Muscle: No Joint: No Bone:  No] Epithelialization: [1:None Compression Therapy] [N/A:N/A N/A] Treatment Notes Electronic Signature(s) Signed: 11/03/2019 7:47:18 AM By: Linton Ham MD Entered By: Linton Ham on 11/02/2019 12:57:21 -------------------------------------------------------------------------------- Multi-Disciplinary Care Plan Details Patient Name: Date of Service: [name] 11/02/2019 11:15 AM Medical Record [id number] Patient Account Number: [id number] Date of Birth/Sex: Treating RN: 08-02-1936 (83 y.o. Elam Dutch Primary Care Rukiya Hodgkins: [name] Other Clinician: Referring Daquawn Seelman: Treating Jodean Valade/Extender:Robson, Vista Lawman, Moses Manners in Treatment: 3 Active Inactive Abuse / Safety / Falls / Self Care Management Nursing Diagnoses: Potential for falls Potential for injury related to falls Goals: Patient will not experience any injury related to falls Date Initiated: 10/12/2019 Target Resolution Date: 11/13/2019 Goal Status: Active Patient/caregiver will verbalize understanding of skin care regimen Date Initiated: 10/12/2019 Target Resolution Date: 11/13/2019 Goal Status: Active Patient/caregiver will verbalize/demonstrate measures taken to prevent injury and/or falls Date Initiated: 10/12/2019 Target Resolution Date: 11/13/2019 Goal Status: Active Interventions: Assess Activities of Daily Living upon admission and as needed Assess fall risk on admission and as needed Assess: immobility, friction, shearing, incontinence upon admission and as needed Assess impairment of mobility on admission and as needed per policy Assess personal safety and home safety (as indicated) on admission and as needed Assess self care needs on admission and as needed Provide education on fall prevention Provide education on personal and home safety Notes: Wound/Skin Impairment Nursing Diagnoses: Impaired tissue integrity Knowledge deficit related to  ulceration/compromised skin integrity Goals: Patient/caregiver will verbalize understanding of skin care regimen Date Initiated: 10/12/2019 Target Resolution Date: 11/13/2019 Goal Status: Active Ulcer/skin breakdown will have a volume reduction of 30% by week 4 Date Initiated: 10/12/2019 Target Resolution Date: 11/13/2019 Goal Status: Active Interventions: Assess patient/caregiver ability to obtain necessary supplies Assess patient/caregiver ability to perform ulcer/skin care regimen upon admission and as needed Assess ulceration(s) every visit Provide education on ulcer and skin care Notes: Electronic Signature(s) Signed: 11/02/2019 5:05:42 PM By: Baruch Gouty RN, BSN Entered By: Baruch Gouty on 11/02/2019 12:21:25 -------------------------------------------------------------------------------- Pain Assessment Details Patient Name: Date of Service: [name] 11/02/2019 11:15 AM Medical Record [id number] Patient Account Number: [id number] Date of Birth/Sex: Treating RN: [birth year] (83 y.o. Orvan Falconer Primary Care Kiaya Haliburton: [name] Other Clinician: Referring Carolena Fairbank: Treating Marita Burnsed/Extender:Robson, Vista Lawman, Moses Manners in Treatment: 3 Active Problems Location of Pain Severity and Description of Pain Patient Has Paino No Site Locations Pain Management and Medication Current Pain Management: Electronic Signature(s) Signed: 11/04/2019 11:01:46 AM By: Carlene Coria RN Entered By: Carlene Coria on 11/02/2019 11:36:48 -------------------------------------------------------------------------------- Patient/Caregiver Education Details Tamala Julian, ROSE MARIE 12/21/2020andnbsp11:15 Patient Name: Date of Service: C. AM Medical Record Patient Account Number: [id number] Number: Treating RN: Baruch Gouty Date of Birth/Gender: 06/24/1936 (83 y.o. F) Other Clinician: Primary Care Physician:Griffin, Jenny Reichmann  Treating Linton Ham Referring Physician: Physician/Extender: Lavena Bullion in Treatment: 3 Education Assessment Education Provided To: Patient Education Topics Provided Venous: Methods: Explain/Verbal Responses: Reinforcements needed, State content correctly Wound/Skin Impairment: Methods: Explain/Verbal Responses: Reinforcements needed, State content correctly Electronic Signature(s) Signed: 11/02/2019 5:05:42 PM By: Baruch Gouty RN, BSN Entered By: Baruch Gouty on 11/02/2019 12:21:50 -------------------------------------------------------------------------------- Wound Assessment Details Patient Name: Date of Service: [name] 11/02/2019 11:15 AM Medical Record [id number] Patient Account Number: [id number] Date of Birth/Sex: Treating RN: 28-Aug-1936 (83 y.o. Orvan Falconer Primary Care Patsy Varma: [name] Other Clinician: Referring Rohith Fauth: Treating Brayden Betters/Extender:Robson, Vista Lawman, Moses Manners in Treatment: 3 Wound Status Wound Number: 1 Primary Lymphedema Etiology: Wound Location: Right Lower Leg - Posterior, Distal Wound Open Wounding Event: Trauma Status: Date Acquired: 09/08/2019 Comorbid Congestive Heart Failure, Hypertension, Weeks Of Treatment: 3 History: Received Radiation Clustered Wound: No Wound Measurements Length: (cm) 1.2 Width: (cm) 2 Depth: (cm) 0.2 Area: (cm) 1.885 Volume: (cm) 0.377 % Reduction in Area: 60% % Reduction in Volume: 20% Epithelialization: None Tunneling: Yes Position (o'clock): 9 Maximum Distance: (cm) 2 Undermining: No Wound Description Full Thickness Without Exposed Support Classification: Structures Wound Well defined, not attached Margin: Exudate Medium Amount: Exudate Serosanguineous Type: Exudate red, brown Color: Wound Bed Granulation Amount: Medium (34-66%) Granulation Quality: Red Necrotic Amount: Medium (34-66%) Necrotic Quality: Adherent Slough Foul Odor After  Cleansing: No Slough/Fibrino Yes Exposed Structure Fascia Exposed: No Fat Layer (Subcutaneous Tissue) Exposed: Yes Tendon Exposed: No Muscle Exposed: No Joint Exposed: No Bone Exposed: No Treatment Notes Wound #1 (Right, Distal, Posterior Lower Leg) 1. Cleanse With Wound Cleanser Soap and water 2. Periwound Care Moisturizing lotion TCA Cream 3. Primary Dressing Applied Calcium Alginate Ag 4. Secondary Dressing Dry Gauze Kerramax/Xtrasorb 6. Support Layer Applied 3 layer compression wrap Notes netting. Electronic Signature(s) Signed: 11/04/2019 11:01:46 AM By: Carlene Coria RN Entered By: Carlene Coria on 11/02/2019 11:37:57 -------------------------------------------------------------------------------- Vitals Details Patient Name: Date of Service: [name] 11/02/2019 11:15 AM Medical Record [id number] Patient Account Number: [id number] Date of Birth/Sex: Treating RN: [birth year] (83 y.o. Orvan Falconer Primary Care Zarina Pe: [name] Other Clinician: Referring Lillyahna Hemberger: Treating Zakhari Fogel/Extender:Robson, Vista Lawman, Moses Manners in Treatment: 3 Vital Signs Time Taken: 11:33 Temperature (F): 98 Height (in): 58 Pulse (bpm): 66 Weight (lbs): 111 Respiratory Rate (breaths/min): 18 Body Mass Index (BMI): 23.2 Blood Pressure (mmHg): 144/89 Reference Range: 80 - 120 mg / dl Electronic Signature(s) Signed: 11/04/2019 11:01:46 AM By: Carlene Coria RN Entered By: Carlene Coria on 11/02/2019 11:36:36

## 2019-11-09 ENCOUNTER — Encounter (HOSPITAL_BASED_OUTPATIENT_CLINIC_OR_DEPARTMENT_OTHER): Payer: Medicare Other | Admitting: Internal Medicine

## 2019-11-09 ENCOUNTER — Other Ambulatory Visit: Payer: Self-pay

## 2019-11-09 DIAGNOSIS — L97212 Non-pressure chronic ulcer of right calf with fat layer exposed: Secondary | ICD-10-CM | POA: Diagnosis not present
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
## 2019-11-09 NOTE — Progress Notes (Signed)
[name], [name] ([id number]) Visit Report for 11/09/2019 Arrival Information Details Patient Name: Date of Service: [name], [name] 11/09/2019 12:30 PM Medical Record [id number] Patient Account Number: [id number] Date of Birth/Sex: Treating RN: 06-04-1936 (83 y.o. [name] Primary Care Gurshan Settlemire: [name] Other Clinician: Referring Jestina Stephani: Treating Benard Minturn/Extender:Robson, Vista Lawman, Moses Manners in Treatment: 4 Visit Information History Since Last Visit Added or deleted any medications: No Patient Arrived: [name] Any new allergies or adverse reactions: No Arrival Time: 13:30 Had a fall or experienced change in No Accompanied By: self activities of daily living that may affect Transfer Assistance: None risk of falls: Patient Identification Verified: Yes Signs or symptoms of abuse/neglect since last No Secondary Verification Process Yes visito Completed: Hospitalized since last visit: No Patient Has Alerts: Yes Implantable device outside of the clinic excluding No Patient Alerts: Patient on Blood cellular tissue based products placed in the center Thinner since last visit: Has Dressing in Place as Prescribed: Yes Has Compression in Place as Prescribed: Yes Pain Present Now: No Electronic Signature(s) Signed: 11/09/2019 5:46:04 PM By: Kela Millin Entered By: Kela Millin on 11/09/2019 13:30:27 -------------------------------------------------------------------------------- Compression Therapy Details Patient Name: Date of Service: Annette Heads. 11/09/2019 12:30 PM Medical Record [id number] Patient Account Number: [id number] Date of Birth/Sex: Treating RN: Aug 17, 1936 (83 y.o. [name] Primary Care Jeanne Terrance: [name] Other Clinician: Referring Islay Polanco: Treating Zayleigh Stroh/Extender:Robson, Vista Lawman, Moses Manners in Treatment: 4 Compression Therapy Performed for Wound Wound #1  Right,Distal,Posterior Lower Leg Assessment: Performed By: Clinician Levan Hurst, RN Compression Type: Three Layer Post Procedure Diagnosis Same as Pre-procedure Electronic Signature(s) Signed: 11/09/2019 6:10:06 PM By: Levan Hurst RN, BSN Entered By: Levan Hurst on 11/09/2019 13:59:41 -------------------------------------------------------------------------------- Lower Extremity Assessment Details Patient Name: Date of Service: Annette Heads. 11/09/2019 12:30 PM Medical Record [id number] Patient Account Number: [id number] Date of Birth/Sex: Treating RN: [birth year] (83 y.o. [name] Primary Care Gussie Murton: [name] Other Clinician: Referring Xanthe Couillard: Treating Tawnya Pujol/Extender:Robson, Vista Lawman, Moses Manners in Treatment: 4 Edema Assessment Assessed: [Left: No] [Right: No] Edema: [Left: Ye] [Right: s] Calf Left: Right: Point of Measurement: 37 cm From Medial Instep cm 32 cm Ankle Left: Right: Point of Measurement: 10 cm From Medial Instep cm 23.2 cm Vascular Assessment Pulses: Dorsalis Pedis Palpable: [Right:Yes] Electronic Signature(s) Signed: 11/09/2019 5:46:04 PM By: Kela Millin Entered By: Kela Millin on 11/09/2019 13:31:19 -------------------------------------------------------------------------------- Multi Wound Chart Details Patient Name: Date of Service: Annette Mallet C. 11/09/2019 12:30 PM Medical Record [id number] Patient Account Number: [id number] Date of Birth/Sex: Treating RN: 13-Feb-1936 (83 y.o. F) Primary Care Abagayle Klutts: [name] Other Clinician: Referring Vinia Jemmott: Treating Oletha Tolson/Extender:Robson, Vista Lawman, Moses Manners in Treatment: 4 Vital Signs Height(in): 58 Pulse(bpm): 70 Weight(lbs): 111 Blood Pressure(mmHg): 128/82 Body Mass Index(BMI): 23 Temperature(F): 97.8 Respiratory 18 Rate(breaths/min): Photos: [1:No Photos] [N/A:N/A] Wound Location: [1:Right Lower Leg  - Posterior, N/A Distal] Wounding Event: [1:Trauma] [N/A:N/A] Primary Etiology: [1:Lymphedema] [N/A:N/A] Comorbid History: [1:Congestive Heart Failure, N/A Hypertension, Received Radiation] Date Acquired: [1:09/08/2019] [N/A:N/A] Weeks of Treatment: [1:4] [N/A:N/A] Wound Status: [1:Open] [N/A:N/A] Measurements L x W x D 2x2.2x0.2 [N/A:N/A] (cm) Area (cm) : [1:3.456] [N/A:N/A] Volume (cm) : [1:0.691] [N/A:N/A] % Reduction in Area: [1:26.70%] [N/A:N/A] % Reduction in Volume: -46.70% [N/A:N/A] Position 1 (o'clock): 9 Maximum Distance 1 [1:2.7] (cm): Tunneling: [1:Yes] [N/A:N/A] Classification: [1:Full Thickness Without Exposed Support Structures] [N/A:N/A] Exudate Amount: [1:Medium] [N/A:N/A] Exudate Type: [1:Purulent] [N/A:N/A] Exudate Color: [1:yellow, brown, green] [N/A:N/A] Wound Margin: [1:Well defined, not attached N/A] Granulation Amount: [1:Medium (34-66%)] [  N/A:N/A] Granulation Quality: [1:Red] [N/A:N/A] Necrotic Amount: [1:Medium (34-66%)] [N/A:N/A] Exposed Structures: [1:Fat Layer (Subcutaneous N/A Tissue) Exposed: Yes Fascia: No Tendon: No Muscle: No Joint: No Bone: No] Epithelialization: [1:None] [N/A:N/A] Debridement: [1:Debridement - Excisional N/A] Pre-procedure [1:13:55] [N/A:N/A] Verification/Time Out Taken: Pain Control: [1:Other] [N/A:N/A] Tissue Debrided: [1:Subcutaneous, Slough] [N/A:N/A] Level: [1:Skin/Subcutaneous Tissue N/A] Debridement Area (sq cm):4.4 [N/A:N/A] Instrument: [1:Curette] [N/A:N/A] Bleeding: [1:Minimum] [N/A:N/A] Hemostasis Achieved: [1:Pressure] [N/A:N/A] Procedural Pain: [1:4] [N/A:N/A] Post Procedural Pain: [1:1] [N/A:N/A] Debridement Treatment [1:Procedure was tolerated] [N/A:N/A] Response: [1:well] Post Debridement [1:2x2.2x0.2] [N/A:N/A] Measurements L x W x D (cm) Post Debridement [1:0.691] [N/A:N/A] Volume: (cm) Procedures Performed: [1:Compression Therapy Debridement] [N/A:N/A] Treatment Notes Electronic  Signature(s) Signed: 11/09/2019 6:10:19 PM By: Linton Ham MD Entered By: Linton Ham on 11/09/2019 14:00:37 -------------------------------------------------------------------------------- Multi-Disciplinary Care Plan Details Patient Name: Date of Service: Annette Blinks [name] 11/09/2019 12:30 PM Medical Record [id number] Patient Account Number: [id number] Date of Birth/Sex: Treating RN: September 10, 1936 (83 y.o. [name] Primary Care Nikala Walsworth: [name] Other Clinician: Referring Lovis More: Treating Marvell Stavola/Extender:Robson, Vista Lawman, Moses Manners in Treatment: 4 Active Inactive Abuse / Safety / Falls / Self Care Management Nursing Diagnoses: Potential for falls Potential for injury related to falls Goals: Patient will not experience any injury related to falls Date Initiated: 10/12/2019 Target Resolution Date: 12/11/2019 Goal Status: Active Patient/caregiver will verbalize understanding of skin care regimen Date Initiated: 10/12/2019 Target Resolution Date: 12/11/2019 Goal Status: Active Patient/caregiver will verbalize/demonstrate measures taken to prevent injury and/or falls Date Initiated: 10/12/2019 Date Inactivated: 11/09/2019 Target Resolution Date: 11/13/2019 Goal Status: Met Interventions: Assess Activities of Daily Living upon admission and as needed Assess fall risk on admission and as needed Assess: immobility, friction, shearing, incontinence upon admission and as needed Assess impairment of mobility on admission and as needed per policy Assess personal safety and home safety (as indicated) on admission and as needed Assess self care needs on admission and as needed Provide education on fall prevention Provide education on personal and home safety Notes: Wound/Skin Impairment Nursing Diagnoses: Impaired tissue integrity Knowledge deficit related to ulceration/compromised skin integrity Goals: Patient/caregiver will verbalize  understanding of skin care regimen Date Initiated: 10/12/2019 Target Resolution Date: 12/11/2019 Goal Status: Active Ulcer/skin breakdown will have a volume reduction of 30% by week 4 Date Initiated: 10/12/2019 Date Inactivated: 11/09/2019 Target Resolution Date: 11/13/2019 Goal Status: Met Ulcer/skin breakdown will have a volume reduction of 50% by week 8 Date Initiated: 11/09/2019 Target Resolution Date: 12/11/2019 Goal Status: Active Interventions: Assess patient/caregiver ability to obtain necessary supplies Assess patient/caregiver ability to perform ulcer/skin care regimen upon admission and as needed Assess ulceration(s) every visit Provide education on ulcer and skin care Notes: Electronic Signature(s) Signed: 11/09/2019 6:10:06 PM By: Levan Hurst RN, BSN Entered By: Levan Hurst on 11/09/2019 13:59:14 -------------------------------------------------------------------------------- Pain Assessment Details Patient Name: Date of Service: Annette Mallet C. 11/09/2019 12:30 PM Medical Record [id number] Patient Account Number: [id number] Date of Birth/Sex: Treating RN: 1936/04/27 (83 y.o. [name] Primary Care Kyilee Gregg: [name] Other Clinician: Referring Kalkidan Caudell: Treating Alexina Niccoli/Extender:Robson, Vista Lawman, Moses Manners in Treatment: 4 Active Problems Location of Pain Severity and Description of Pain Patient Has Paino No Site Locations Pain Management and Medication Current Pain Management: Electronic Signature(s) Signed: 11/09/2019 5:46:04 PM By: Kela Millin Entered By: Kela Millin on 11/09/2019 13:30:55 -------------------------------------------------------------------------------- Patient/Caregiver Education Details Jaskot, ROSE MARIE 12/28/2020andnbsp12:30 Patient Name: Date of Service: C. PM Medical Record Patient Account Number: [id number] Number: Treating RN: Levan Hurst Date of Birth/Gender:  04/28/1936 (83 y.o. F)  Other Clinician: Primary Care Physician:Griffin, John Treating Linton Ham Referring Physician: Physician/Extender: Lavena Bullion in Treatment: 4 Education Assessment Education Provided To: Patient Education Topics Provided Wound/Skin Impairment: Methods: Explain/Verbal Responses: State content correctly Electronic Signature(s) Signed: 11/09/2019 6:10:06 PM By: Levan Hurst RN, BSN Entered By: Levan Hurst on 11/09/2019 17:59:46 -------------------------------------------------------------------------------- Wound Assessment Details Patient Name: Date of Service: Annette Heads. 11/09/2019 12:30 PM Medical Record [id number] Patient Account Number: [id number] Date of Birth/Sex: Treating RN: [birth year] (83 y.o. [name] Primary Care Maleia Weems: [name] Other Clinician: Referring Shia Delaine: Treating Shenea Giacobbe/Extender:Robson, Vista Lawman, Moses Manners in Treatment: 4 Wound Status Wound Number: 1 Primary Lymphedema Etiology: Wound Location: Right Lower Leg - Posterior, Distal Wound Open Wounding Event: Trauma Status: Date Acquired: 09/08/2019 Comorbid Congestive Heart Failure, Hypertension, Weeks Of Treatment: 4 History: Received Radiation Clustered Wound: No Wound Measurements Length: (cm) 2 Width: (cm) 2.2 Depth: (cm) 0.2 Area: (cm) 3.456 Volume: (cm) 0.691 % Reduction in Area: 26.7% % Reduction in Volume: -46.7% Epithelialization: None Tunneling: Yes Position (o'clock): 9 Maximum Distance: (cm) 2.7 Undermining: No Wound Description Classification: Full Thickness Without Exposed Support Structures Wound Well defined, not attached Margin: Exudate Medium Amount: Exudate Purulent Type: Exudate yellow, brown, green Color: Wound Bed Granulation Amount: Medium (34-66%) Granulation Quality: Red Necrotic Amount: Medium (34-66%) Necrotic Quality: Adherent Slough Foul Odor After Cleansing:  No Slough/Fibrino Yes Exposed Structure Fascia Exposed: No Fat Layer (Subcutaneous Tissue) Exposed: Yes Tendon Exposed: No Muscle Exposed: No Joint Exposed: No Bone Exposed: No Electronic Signature(s) Signed: 11/09/2019 5:46:04 PM By: Kela Millin Entered By: Kela Millin on 11/09/2019 13:36:20 -------------------------------------------------------------------------------- [place] Details Patient Name: Date of Service: Annette Mallet C. 11/09/2019 12:30 PM Medical Record [id number] Patient Account Number: [id number] Date of Birth/Sex: Treating RN: October 31, 1936 (83 y.o. [name] Primary Care Leeandra Ellerson: [name] Other Clinician: Referring Mohd. Derflinger: Treating Tamera Pingley/Extender:Robson, Vista Lawman, Moses Manners in Treatment: 4 Vital Signs Time Taken: 13:30 Temperature (F): 97.8 Height (in): 58 Pulse (bpm): 75 Weight (lbs): 111 Respiratory Rate (breaths/min): 18 Body Mass Index (BMI): 23.2 Blood Pressure (mmHg): 128/82 Reference Range: 80 - 120 mg / dl Electronic Signature(s) Signed: 11/09/2019 5:46:04 PM By: Kela Millin Entered By: Kela Millin on 11/09/2019 13:30:50

## 2019-11-12 ENCOUNTER — Encounter

## 2019-11-16 ENCOUNTER — Encounter (HOSPITAL_BASED_OUTPATIENT_CLINIC_OR_DEPARTMENT_OTHER): Payer: Medicare Other | Admitting: Internal Medicine

## 2019-11-16 ENCOUNTER — Other Ambulatory Visit: Payer: Self-pay

## 2019-11-16 DIAGNOSIS — I351 Nonrheumatic aortic (valve) insufficiency: Secondary | ICD-10-CM | POA: Insufficient documentation

## 2019-11-16 DIAGNOSIS — Z853 Personal history of malignant neoplasm of breast: Secondary | ICD-10-CM | POA: Diagnosis not present

## 2019-11-16 DIAGNOSIS — S8011XD Contusion of right lower leg, subsequent encounter: Secondary | ICD-10-CM | POA: Diagnosis not present

## 2019-11-16 DIAGNOSIS — I5030 Unspecified diastolic (congestive) heart failure: Secondary | ICD-10-CM | POA: Insufficient documentation

## 2019-11-16 DIAGNOSIS — W228XXD Striking against or struck by other objects, subsequent encounter: Secondary | ICD-10-CM | POA: Insufficient documentation

## 2019-11-16 DIAGNOSIS — L03115 Cellulitis of right lower limb: Secondary | ICD-10-CM | POA: Insufficient documentation

## 2019-11-16 DIAGNOSIS — Z85828 Personal history of other malignant neoplasm of skin: Secondary | ICD-10-CM | POA: Insufficient documentation

## 2019-11-16 DIAGNOSIS — I4891 Unspecified atrial fibrillation: Secondary | ICD-10-CM | POA: Diagnosis not present

## 2019-11-16 DIAGNOSIS — L97212 Non-pressure chronic ulcer of right calf with fat layer exposed: Secondary | ICD-10-CM | POA: Diagnosis not present

## 2019-11-16 DIAGNOSIS — Z7901 Long term (current) use of anticoagulants: Secondary | ICD-10-CM | POA: Insufficient documentation

## 2019-11-16 DIAGNOSIS — I872 Venous insufficiency (chronic) (peripheral): Secondary | ICD-10-CM | POA: Insufficient documentation

## 2019-11-16 DIAGNOSIS — I110 Hypertensive heart disease with heart failure: Secondary | ICD-10-CM | POA: Insufficient documentation

## 2019-11-16 DIAGNOSIS — Z880 Allergy status to penicillin: Secondary | ICD-10-CM | POA: Insufficient documentation
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
## 2019-11-16 NOTE — Progress Notes (Signed)
[name], [name] ([id number]) Visit Report for 11/16/2019 Arrival Information Details [name]: Date of Service: [name], [name] 11/16/2019 2:00 PM Medical Record [id number] Patient Account Number: [id number] Date of Birth/Sex: Treating RN: 11/29/1935 (84 y.o. Orvan Falconer Primary Care Lyzbeth Genrich: [name] Other Clinician: Referring Lynette Noah: Treating Darreld Hoffer/Extender:Robson, Vista Lawman, Moses Manners in Treatment: 5 Visit Information History Since Last Visit All ordered tests and consults were completed: No Patient Arrived: [name] Added or deleted any medications: No Arrival Time: 13:48 Any new allergies or adverse reactions: No Accompanied By: self Had a fall or experienced change in No Transfer Assistance: None activities of daily living that may affect Patient Identification Verified: Yes risk of falls: Secondary Verification Process Yes Signs or symptoms of abuse/neglect since last No Completed: visito Patient Has Alerts: Yes Hospitalized since last visit: No Patient Alerts: Patient on Blood Implantable device outside of the clinic excluding No Thinner cellular tissue based products placed in the center since last visit: Has Dressing in Place as Prescribed: Yes Has Compression in Place as Prescribed: Yes Pain Present Now: No Electronic Signature(s) Signed: 11/16/2019 4:44:52 PM By: Carlene Coria RN Entered By: Carlene Coria on 11/16/2019 13:53:14 -------------------------------------------------------------------------------- Compression Therapy Details [name]: Date of Service: KASYN, STOUFFER 11/16/2019 2:00 PM Medical Record [id number] Patient Account Number: [id number] Date of Birth/Sex: Treating RN: 12/22/1935 (84 y.o. [name] Primary Care Shequila Neglia: [name] Other Clinician: Referring Deone Leifheit: Treating Jami Bogdanski/Extender:Robson, Vista Lawman, Moses Manners in Treatment: 5 Compression Therapy Performed for  Wound Wound #1 Right,Distal,Posterior Lower Leg Assessment: Performed By: Clinician Levan Hurst, RN Compression Type: Three Layer Post Procedure Diagnosis Same as Pre-procedure Electronic Signature(s) Signed: 11/16/2019 5:01:35 PM By: Levan Hurst RN, BSN Entered By: Levan Hurst on 11/16/2019 14:28:34 -------------------------------------------------------------------------------- Encounter Discharge Information Details [name]: Date of Service: [name] Annette C. 11/16/2019 2:00 PM Medical Record [id number] Patient Account Number: [id number] Date of Birth/Sex: Treating RN: Jul 13, 1936 (84 y.o. [name], [name] Primary Care Haleem Hanner: [name] Other Clinician: Referring Nehal Shives: Treating Aarilyn Dye/Extender:Robson, Vista Lawman, Moses Manners in Treatment: 5 Encounter Discharge Information Items Discharge Condition: Stable Ambulatory Status: Walker Discharge Destination: Home Transportation: Private Auto Accompanied By: self Schedule Follow-up Appointment: Yes Clinical Summary of Care: Electronic Signature(s) Signed: 11/16/2019 4:44:32 PM By: Deon Pilling Entered By: Deon Pilling on 11/16/2019 14:40:36 -------------------------------------------------------------------------------- Lower Extremity Assessment Details [name]: Date of Service: [name], [name] 11/16/2019 2:00 PM Medical Record [id number] Patient Account Number: [id number] Date of Birth/Sex: Treating RN: February 08, 1936 (84 y.o. Orvan Falconer Primary Care Tehilla Coffel: [name] Other Clinician: Referring Sophiea Ueda: Treating Romy Mcgue/Extender:Robson, Vista Lawman, Moses Manners in Treatment: 5 Edema Assessment Assessed: [Left: No] [Right: No] Edema: [Left: Ye] [Right: s] Calf Left: Right: Point of Measurement: 37 cm From Medial Instep cm 32 cm Ankle Left: Right: Point of Measurement: 10 cm From Medial Instep cm 22 cm Electronic Signature(s) Signed: 11/16/2019 4:44:52 PM By:  Carlene Coria RN Entered By: Carlene Coria on 11/16/2019 14:00:50 -------------------------------------------------------------------------------- Multi Wound Chart Details [name]: Date of Service: [name] Annette C. 11/16/2019 2:00 PM Medical Record [id number] Patient Account Number: [id number] Date of Birth/Sex: Treating RN: 1936-09-21 (84 y.o. F) Primary Care Shima Compere: [name] Other Clinician: Referring Nataline Basara: Treating Kaitlinn Iversen/Extender:Robson, Vista Lawman, Moses Manners in Treatment: 5 Vital Signs Height(in): 58 Pulse(bpm): 25 Weight(lbs): 111 Blood Pressure(mmHg): 126/72 Body Mass Index(BMI): 23 Temperature(F): 98.1 Respiratory 18 Rate(breaths/min): Photos: [1:No Photos] [N/A:N/A] Wound Location: [1:Right Lower Leg - Posterior, Distal] [N/A:N/A] Wounding Event: [1:Trauma] [N/A:N/A] Primary Etiology: [1:Lymphedema] [  N/A:N/A] Comorbid History: [1:Congestive Heart Failure, Hypertension, Received Radiation] [N/A:N/A] Date Acquired: [1:09/08/2019] [N/A:N/A] Weeks of Treatment: [1:5] [N/A:N/A] Wound Status: [1:Open] [N/A:N/A] Measurements L x W x D 1.7x1.6x0.2 [N/A:N/A] (cm) Area (cm) : [1:2.136] [N/A:N/A] Volume (cm) : [1:0.427] [N/A:N/A] % Reduction in Area: [1:54.70%] [N/A:N/A] % Reduction in Volume: 9.30% [N/A:N/A] Position 1 (o'clock): 9 Maximum Distance 1 [1:2.2] (cm): Tunneling: [1:Yes] [N/A:N/A] Classification: [1:Full Thickness Without Exposed Support Structures] [N/A:N/A] Exudate Amount: [1:Medium] [N/A:N/A] Exudate Type: [1:Serosanguineous] [N/A:N/A] Exudate Color: [1:red, brown] [N/A:N/A] Wound Margin: [1:Well defined, not attached N/A] Granulation Amount: [1:Large (67-100%)] [N/A:N/A] Granulation Quality: [1:Red] [N/A:N/A] Necrotic Amount: [1:None Present (0%)] [N/A:N/A] Exposed Structures: [1:Fat Layer (Subcutaneous N/A Tissue) Exposed: Yes Fascia: No Tendon: No Muscle: No Joint: No Bone: No] Epithelialization: [1:None  Compression Therapy] [N/A:N/A N/A] Treatment Notes Wound #1 (Right, Distal, Posterior Lower Leg) 1. Cleanse With Wound Cleanser Soap and water 2. Periwound Care Moisturizing lotion 3. Primary Dressing Applied Hydrofera Blue 4. Secondary Dressing ABD Pad Dry Gauze 6. Support Layer Applied 3 layer compression wrap Notes hydrofera blue classic packed into tunnel. netting. Electronic Signature(s) Signed: 11/16/2019 4:56:38 PM By: Linton Ham MD Entered By: Linton Ham on 11/16/2019 15:37:31 -------------------------------------------------------------------------------- Multi-Disciplinary Care Plan Details [name]: Date of Service: [name] Annette C. 11/16/2019 2:00 PM Medical Record [id number] Patient Account Number: [id number] Date of Birth/Sex: Treating RN: 1935-12-11 (84 y.o. [name] Primary Care Felix Meras: [name] Other Clinician: Referring Capri Veals: Treating Torianna Junio/Extender:Robson, Vista Lawman, Moses Manners in Treatment: 5 Active Inactive Abuse / Safety / Falls / Self Care Management Nursing Diagnoses: Potential for falls Potential for injury related to falls Goals: Patient will not experience any injury related to falls Date Initiated: 10/12/2019 Target Resolution Date: 12/11/2019 Goal Status: Active Patient/caregiver will verbalize understanding of skin care regimen Date Initiated: 10/12/2019 Target Resolution Date: 12/11/2019 Goal Status: Active Patient/caregiver will verbalize/demonstrate measures taken to prevent injury and/or falls Date Initiated: 10/12/2019 Date Inactivated: 11/09/2019 Target Resolution Date: 11/13/2019 Goal Status: Met Interventions: Assess Activities of Daily Living upon admission and as needed Assess fall risk on admission and as needed Assess: immobility, friction, shearing, incontinence upon admission and as needed Assess impairment of mobility on admission and as needed per policy Assess personal  safety and home safety (as indicated) on admission and as needed Assess self care needs on admission and as needed Provide education on fall prevention Provide education on personal and home safety Notes: Wound/Skin Impairment Nursing Diagnoses: Impaired tissue integrity Knowledge deficit related to ulceration/compromised skin integrity Goals: Patient/caregiver will verbalize understanding of skin care regimen Date Initiated: 10/12/2019 Target Resolution Date: 12/11/2019 Goal Status: Active Ulcer/skin breakdown will have a volume reduction of 30% by week 4 Date Initiated: 10/12/2019 Date Inactivated: 11/09/2019 Target Resolution Date: 11/13/2019 Goal Status: Met Ulcer/skin breakdown will have a volume reduction of 50% by week 8 Date Initiated: 11/09/2019 Target Resolution Date: 12/11/2019 Goal Status: Active Interventions: Assess patient/caregiver ability to obtain necessary supplies Assess patient/caregiver ability to perform ulcer/skin care regimen upon admission and as needed Assess ulceration(s) every visit Provide education on ulcer and skin care Notes: Electronic Signature(s) Signed: 11/16/2019 5:01:35 PM By: Levan Hurst RN, BSN Entered By: Levan Hurst on 11/16/2019 16:54:01 -------------------------------------------------------------------------------- Pain Assessment Details [name]: Date of Service: [name] Annette C. 11/16/2019 2:00 PM Medical Record [id number] Patient Account Number: [id number] Date of Birth/Sex: Treating RN: [birth year] (84 y.o. Orvan Falconer Primary Care Brantley Naser: [name] Other Clinician: Referring Ilda Laskin: Treating Annabelle Rexroad/Extender:Robson, Vista Lawman, Moses Manners in Treatment: 5 Active Problems  Location of Pain Severity and Description of Pain Patient Has Paino No Site Locations Pain Management and Medication Current Pain Management: Electronic Signature(s) Signed: 11/16/2019 4:44:52 PM By: Carlene Coria RN Entered  By: Carlene Coria on 11/16/2019 13:54:15 -------------------------------------------------------------------------------- Patient/Caregiver Education Details [name]: Date of Service: [name] 1/4/2021andnbsp2:00 PM Medical Record [phone] Patient Account Number: [id number] Date of Birth/Gender: Treating RN: [birth year] (84 y.o. [name] Primary Care Physician: [name] Other Clinician: Referring Physician: Treating Physician/Extender:Robson, Vista Lawman, Moses Manners in Treatment: 5 Education Assessment Education Provided To: Patient Education Topics Provided Wound/Skin Impairment: Methods: Explain/Verbal Responses: State content correctly Electronic Signature(s) Signed: 11/16/2019 5:01:35 PM By: Levan Hurst RN, BSN Entered By: Levan Hurst on 11/16/2019 16:54:10 -------------------------------------------------------------------------------- Wound Assessment Details [name]: Date of Service: [name] 11/16/2019 2:00 PM Medical Record [id number] Patient Account Number: [id number] Date of Birth/Sex: Treating RN: 1936-10-28 (84 y.o. Orvan Falconer Primary Care Jerid Catherman: [name] Other Clinician: Referring Patrica Mendell: Treating Janny Crute/Extender:Robson, Vista Lawman, Moses Manners in Treatment: 5 Wound Status Wound Number: 1 Primary Lymphedema Etiology: Wound Location: Right Lower Leg - Posterior, Distal Wound Open Wounding Event: Trauma Status: Date Acquired: 09/08/2019 Comorbid Congestive Heart Failure, Hypertension, Weeks Of Treatment: 5 History: Received Radiation Clustered Wound: No Wound Measurements Length: (cm) 1.7 Width: (cm) 1.6 Depth: (cm) 0.2 Area: (cm) 2.136 Volume: (cm) 0.427 % Reduction in Area: 54.7% % Reduction in Volume: 9.3% Epithelialization: None Tunneling: Yes Position (o'clock): 9 Maximum Distance: (cm) 2.2 Undermining: No Wound Description Full Thickness Without  Exposed Support Foul Classification: Structures Sloug Wound Well defined, not attached Margin: Exudate Medium Amount: Exudate Exudate Serosanguineous Type: Exudate red, brown Color: Wound Bed Granulation Amount: Large (67-100%) Granulation Quality: Red Fascia Necrotic Amount: None Present (0%) Fat Lay Tendon Muscle Joint E Bone Ex Odor After Cleansing: No h/Fibrino No Exposed Structure Exposed: No er (Subcutaneous Tissue) Exposed: Yes Exposed: No Exposed: No xposed: No posed: No Treatment Notes Wound #1 (Right, Distal, Posterior Lower Leg) 1. Cleanse With Wound Cleanser Soap and water 2. Periwound Care Moisturizing lotion 3. Primary Dressing Applied Hydrofera Blue 4. Secondary Dressing ABD Pad Dry Gauze 6. Support Layer Applied 3 layer compression wrap Notes hydrofera blue classic packed into tunnel. netting. Electronic Signature(s) Signed: 11/16/2019 4:44:52 PM By: Carlene Coria RN Entered By: Carlene Coria on 11/16/2019 14:01:25 -------------------------------------------------------------------------------- [name]: Date of Service: [name], Annette Annette C. 11/16/2019 2:00 PM Medical Record [id number] Patient Account Number: [id number] Date of Birth/Sex: Treating RN: 03-Jul-1936 (84 y.o. Orvan Falconer Primary Care Desmund Elman: [name] Other Clinician: Referring Lateisha Thurlow: Treating Sekai Gitlin/Extender:Robson, Vista Lawman, Moses Manners in Treatment: 5 Vital Signs Time Taken: 12:53 Temperature (F): 98.1 Height (in): 58 Pulse (bpm): 69 Weight (lbs): 111 Respiratory Rate (breaths/min): 18 Body Mass Index (BMI): 23.2 Blood Pressure (mmHg): 126/72 Reference Range: 80 - 120 mg / dl Electronic Signature(s) Signed: 11/16/2019 4:44:52 PM By: Carlene Coria RN Entered By: Carlene Coria on 11/16/2019 13:54:07

## 2019-11-19 ENCOUNTER — Encounter: Payer: Self-pay | Admitting: General Practice

## 2019-11-23 ENCOUNTER — Encounter (HOSPITAL_BASED_OUTPATIENT_CLINIC_OR_DEPARTMENT_OTHER): Payer: Medicare Other | Attending: Internal Medicine | Admitting: Internal Medicine

## 2019-11-23 ENCOUNTER — Other Ambulatory Visit: Payer: Self-pay

## 2019-11-23 DIAGNOSIS — Z7901 Long term (current) use of anticoagulants: Secondary | ICD-10-CM | POA: Insufficient documentation

## 2019-11-23 DIAGNOSIS — Z85828 Personal history of other malignant neoplasm of skin: Secondary | ICD-10-CM | POA: Diagnosis not present

## 2019-11-23 DIAGNOSIS — S8011XA Contusion of right lower leg, initial encounter: Secondary | ICD-10-CM | POA: Diagnosis not present

## 2019-11-23 DIAGNOSIS — I4891 Unspecified atrial fibrillation: Secondary | ICD-10-CM | POA: Insufficient documentation

## 2019-11-23 DIAGNOSIS — I890 Lymphedema, not elsewhere classified: Secondary | ICD-10-CM | POA: Insufficient documentation

## 2019-11-23 DIAGNOSIS — Z880 Allergy status to penicillin: Secondary | ICD-10-CM | POA: Insufficient documentation

## 2019-11-23 DIAGNOSIS — I351 Nonrheumatic aortic (valve) insufficiency: Secondary | ICD-10-CM | POA: Diagnosis not present

## 2019-11-23 DIAGNOSIS — I110 Hypertensive heart disease with heart failure: Secondary | ICD-10-CM | POA: Insufficient documentation

## 2019-11-23 DIAGNOSIS — Z853 Personal history of malignant neoplasm of breast: Secondary | ICD-10-CM | POA: Diagnosis not present

## 2019-11-23 DIAGNOSIS — I5032 Chronic diastolic (congestive) heart failure: Secondary | ICD-10-CM | POA: Diagnosis not present

## 2019-11-23 DIAGNOSIS — M79661 Pain in right lower leg: Secondary | ICD-10-CM | POA: Diagnosis not present

## 2019-11-23 DIAGNOSIS — S81801A Unspecified open wound, right lower leg, initial encounter: Secondary | ICD-10-CM | POA: Diagnosis not present

## 2019-11-23 DIAGNOSIS — L03115 Cellulitis of right lower limb: Secondary | ICD-10-CM | POA: Insufficient documentation

## 2019-11-23 DIAGNOSIS — W228XXA Striking against or struck by other objects, initial encounter: Secondary | ICD-10-CM | POA: Diagnosis not present

## 2019-11-23 DIAGNOSIS — L97212 Non-pressure chronic ulcer of right calf with fat layer exposed: Secondary | ICD-10-CM | POA: Diagnosis not present

## 2019-11-23 DIAGNOSIS — L539 Erythematous condition, unspecified: Secondary | ICD-10-CM | POA: Diagnosis not present

## 2019-11-24 NOTE — Progress Notes (Addendum)
[name], [name] ([id number]) Visit Report for 11/23/2019 Arrival Information Details Patient Name: Date of Service: [name], [name] 11/23/2019 1:00 PM Medical Record [id number] Patient Account Number: [id number] Date of Birth/Sex: Treating RN: 1936/05/14 (84 y.o. [name] Primary Care Devarious Pavek: [name] Other Clinician: Referring Malikah Lakey: Treating Maili Shutters/Extender:Robson, Vista Lawman, Moses Manners in Treatment: 6 Visit Information History Since Last Visit Added or deleted any medications: No Patient Arrived: [name] Any new allergies or adverse reactions: No Arrival Time: 13:12 Had a fall or experienced change in No Accompanied By: alone activities of daily living that may affect Transfer Assistance: None risk of falls: Patient Identification Verified: Yes Signs or symptoms of abuse/neglect since last No Secondary Verification Process Yes visito Completed: Hospitalized since last visit: No Patient Has Alerts: Yes Implantable device outside of the clinic excluding No Patient Alerts: Patient on Blood cellular tissue based products placed in the center Thinner since last visit: Has Dressing in Place as Prescribed: Yes Has Compression in Place as Prescribed: Yes Pain Present Now: Yes Electronic Signature(s) Signed: 11/24/2019 6:18:44 PM By: Levan Hurst RN, BSN Entered By: Levan Hurst on 11/23/2019 13:14:28 -------------------------------------------------------------------------------- Compression Therapy Details Patient Name: Date of Service: Annette Blinks [name] 11/23/2019 1:00 PM Medical Record [id number] Patient Account Number: [id number] Date of Birth/Sex: Treating RN: 1936-03-27 (84 y.o. [name] Primary Care Mathis Cashman: [name] Other Clinician: Referring Luke Rigsbee: Treating Creedence Kunesh/Extender:Robson, Vista Lawman, Moses Manners in Treatment: 6 Compression Therapy Performed for Wound Wound #1  Right,Distal,Posterior Lower Leg Assessment: Performed By: Clinician Levan Hurst, RN Compression Type: Three Layer Post Procedure Diagnosis Same as Pre-procedure Electronic Signature(s) Signed: 11/24/2019 6:18:44 PM By: Levan Hurst RN, BSN Entered By: Levan Hurst on 11/23/2019 13:56:11 -------------------------------------------------------------------------------- Lower Extremity Assessment Details Patient Name: Date of Service: Annette Blinks [name] 11/23/2019 1:00 PM Medical Record [id number] Patient Account Number: [id number] Date of Birth/Sex: Treating RN: 02/23/1936 (84 y.o. [name] Primary Care Arleene Settle: [name] Other Clinician: Referring Tashai Catino: Treating Kendallyn Lippold/Extender:Robson, Vista Lawman, Moses Manners in Treatment: 6 Edema Assessment Assessed: [Left: No] [Right: No] Edema: [Left: Ye] [Right: s] Calf Left: Right: Point of Measurement: 37 cm From Medial Instep cm 32 cm Ankle Left: Right: Point of Measurement: 10 cm From Medial Instep cm 22 cm Vascular Assessment Pulses: Dorsalis Pedis Palpable: [Right:Yes] Electronic Signature(s) Signed: 11/24/2019 6:18:44 PM By: Levan Hurst RN, BSN Entered By: Levan Hurst on 11/23/2019 13:15:06 -------------------------------------------------------------------------------- Multi Wound Chart Details Patient Name: Date of Service: Annette Blinks [name] 11/23/2019 1:00 PM Medical Record [id number] Patient Account Number: [id number] Date of Birth/Sex: Treating RN: [birth year] (84 y.o. F) Primary Care Lavante Toso: [name] Other Clinician: Referring Mirta Mally: Treating Tiyah Zelenak/Extender:Robson, Vista Lawman, Moses Manners in Treatment: 6 Vital Signs Height(in): 13 Pulse(bpm): 52 Weight(lbs): 111 Blood Pressure(mmHg): 149/78 Body Mass Index(BMI): 23 Temperature(F): 97.7 Respiratory 16 Rate(breaths/min): Photos: [1:No Photos] [N/A:N/A] Wound Location: [1:Right Lower Leg -  Posterior, N/A Distal] Wounding Event: [1:Trauma] [N/A:N/A] Primary Etiology: [1:Lymphedema] [N/A:N/A] Comorbid History: [1:Congestive Heart Failure, N/A Hypertension, Received Radiation] Date Acquired: [1:09/08/2019] [N/A:N/A] Weeks of Treatment: [1:6] [N/A:N/A] Wound Status: [1:Open] [N/A:N/A] Measurements L x W x D 1.7x2x0.9 [N/A:N/A] (cm) Area (cm) : [1:2.67] [N/A:N/A] Volume (cm) : [1:2.403] [N/A:N/A] % Reduction in Area: [1:43.30%] [N/A:N/A] % Reduction in Volume: -410.20% [N/A:N/A] Position 1 (o'clock): 9 Maximum Distance 1 [1:2.3] (cm): Tunneling: [1:Yes] [N/A:N/A] Classification: [1:Full Thickness Without Exposed Support Structures] [N/A:N/A] Exudate Amount: [1:Medium] [N/A:N/A] Exudate Type: [1:Purulent] [N/A:N/A] Exudate Color: [1:yellow, brown, green] [N/A:N/A] Wound Margin: [1:Well defined, not attached N/A]  Granulation Amount: [1:Large (67-100%)] [N/A:N/A] Granulation Quality: [1:Red] [N/A:N/A] Necrotic Amount: [1:Small (1-33%)] [N/A:N/A] Exposed Structures: [1:Fat Layer (Subcutaneous N/A Tissue) Exposed: Yes Fascia: No Tendon: No Muscle: No Joint: No Bone: No] Epithelialization: [1:None] [N/A:N/A N/A] Treatment Notes Electronic Signature(s) Signed: 11/23/2019 6:04:26 PM By: Linton Ham MD Entered By: Linton Ham on 11/23/2019 13:58:59 -------------------------------------------------------------------------------- Multi-Disciplinary Care Plan Details Patient Name: Date of Service: Annette Blinks [name] 11/23/2019 1:00 PM Medical Record [id number] Patient Account Number: [id number] Date of Birth/Sex: Treating RN: [birth year] (84 y.o. [name] Primary Care Birdie Beveridge: [name] Other Clinician: Referring Nanako Stopher: Treating Sreeja Spies/Extender:Robson, Vista Lawman, Moses Manners in Treatment: 6 Active Inactive Abuse / Safety / Falls / Self Care Management Nursing Diagnoses: Potential for falls Potential for injury related to  falls Goals: Patient will not experience any injury related to falls Date Initiated: 10/12/2019 Target Resolution Date: 12/11/2019 Goal Status: Active Patient/caregiver will verbalize understanding of skin care regimen Date Initiated: 10/12/2019 Target Resolution Date: 12/11/2019 Goal Status: Active Patient/caregiver will verbalize/demonstrate measures taken to prevent injury and/or falls Date Initiated: 10/12/2019 Date Inactivated: 11/09/2019 Target Resolution Date: 11/13/2019 Goal Status: Met Interventions: Assess Activities of Daily Living upon admission and as needed Assess fall risk on admission and as needed Assess: immobility, friction, shearing, incontinence upon admission and as needed Assess impairment of mobility on admission and as needed per policy Assess personal safety and home safety (as indicated) on admission and as needed Assess self care needs on admission and as needed Provide education on fall prevention Provide education on personal and home safety Notes: Wound/Skin Impairment Nursing Diagnoses: Impaired tissue integrity Knowledge deficit related to ulceration/compromised skin integrity Goals: Patient/caregiver will verbalize understanding of skin care regimen Date Initiated: 10/12/2019 Target Resolution Date: 12/11/2019 Goal Status: Active Ulcer/skin breakdown will have a volume reduction of 30% by week 4 Date Initiated: 10/12/2019 Date Inactivated: 11/09/2019 Target Resolution Date: 11/13/2019 Goal Status: Met Ulcer/skin breakdown will have a volume reduction of 50% by week 8 Date Initiated: 11/09/2019 Target Resolution Date: 12/11/2019 Goal Status: Active Interventions: Assess patient/caregiver ability to obtain necessary supplies Assess patient/caregiver ability to perform ulcer/skin care regimen upon admission and as needed Assess ulceration(s) every visit Provide education on ulcer and skin care Notes: Electronic Signature(s) Signed: 11/24/2019  6:18:44 PM By: Levan Hurst RN, BSN Entered By: Levan Hurst on 11/23/2019 18:58:02 -------------------------------------------------------------------------------- Pain Assessment Details Patient Name: Date of Service: Annette Blinks [name] 11/23/2019 1:00 PM Medical Record [id number] Patient Account Number: [id number] Date of Birth/Sex: Treating RN: 09-01-1936 (84 y.o. [name] Primary Care Syris Brookens: [name] Other Clinician: Referring Latangela Mccomas: Treating Briyah Wheelwright/Extender:Robson, Vista Lawman, Moses Manners in Treatment: 6 Active Problems Location of Pain Severity and Description of Pain Patient Has Paino Yes Site Locations Pain Location: Pain in Ulcers With Dressing Change: Yes Rate the pain. Current Pain Level: 3 Character of Pain Describe the Pain: Throbbing Pain Management and Medication Current Pain Management: Medication: Yes Cold Application: No Rest: No Massage: No Activity: No T.E.N.S.: No Heat Application: No Leg drop or elevation: No Is the Current Pain Management Adequate: Adequate How does your wound impact your activities of daily livingo Sleep: No Bathing: No Appetite: No Relationship With Others: No Bladder Continence: No Emotions: No Bowel Continence: No Work: No Toileting: No Drive: No Dressing: No Hobbies: No Electronic Signature(s) Signed: 11/24/2019 6:18:44 PM By: Levan Hurst RN, BSN Entered By: Levan Hurst on 11/23/2019 13:14:59 -------------------------------------------------------------------------------- Patient/Caregiver Education Details Patient Name: Date of Service: [name], Annette C. 1/11/2021andnbsp1:00 PM Medical Record [id number]  Patient Account Number: [id number] Date of Birth/Gender: Treating RN: [birth year] (84 y.o. [name] Primary Care Physician: [name] Other Clinician: Referring Physician: Treating Physician/Extender:Robson, Vista Lawman, Moses Manners in  Treatment: 6 Education Assessment Education Provided To: Patient Education Topics Provided Wound/Skin Impairment: Methods: Explain/Verbal Responses: State content correctly Electronic Signature(s) Signed: 11/24/2019 6:18:44 PM By: Levan Hurst RN, BSN Entered By: Levan Hurst on 11/23/2019 18:58:12 -------------------------------------------------------------------------------- Wound Assessment Details Patient Name: Date of Service: Annette Mallet C. 11/23/2019 1:00 PM Medical Record [id number] Patient Account Number: [id number] Date of Birth/Sex: Treating RN: [birth year] (84 y.o. [name] Primary Care Ayoub Arey: [name] Other Clinician: Referring Hughie Melroy: Treating Deari Sessler/Extender:Robson, Vista Lawman, Moses Manners in Treatment: 6 Wound Status Wound Number: 1 Primary Lymphedema Etiology: Wound Location: Right, Distal, Posterior Lower Leg Wound Open Wounding Event: Trauma Status: Date Acquired: 09/08/2019 Comorbid Congestive Heart Failure, Hypertension, Weeks Of Treatment: 6 History: Received Radiation Clustered Wound: No Wound Measurements Length: (cm) 1.7 Width: (cm) 2 Depth: (cm) 0.9 Area: (cm) 2.67 Volume: (cm) 2.403 % Reduction in Area: 43.3% % Reduction in Volume: -410.2% Epithelialization: None Tunneling: Yes Position (o'clock): 9 Maximum Distance: (cm) 2.3 Undermining: No Wound Description Classification: Full Thickness Without Exposed Support Foul Odo Structures Slough/F Wound Well defined, not attached Margin: Exudate Medium Amount: Exudate Purulent Type: Exudate yellow, brown, green Color: Wound Bed Granulation Amount: Large (67-100%) Granulation Quality: Red Fascia E Necrotic Amount: Small (1-33%) Fat Laye Necrotic Quality: Adherent Slough Tendon E Muscle E Joint Ex Bone Exp r After Cleansing: No ibrino No Exposed Structure xposed: No r (Subcutaneous Tissue) Exposed: Yes xposed: No xposed: No posed:  No osed: No Electronic Signature(s) Signed: 01/07/2020 2:24:29 PM By: Levan Hurst RN, BSN Previous Signature: 11/24/2019 6:18:44 PM Version By: Levan Hurst RN, BSN Entered By: Levan Hurst on 12/07/2019 13:41:04 -------------------------------------------------------------------------------- [place] Details Patient Name: Date of Service: [name], Annette C. 11/23/2019 1:00 PM Medical Record [id number] Patient Account Number: [id number] Date of Birth/Sex: Treating RN: [birth year] (84 y.o. [name] Primary Care Gahel Safley: [name] Other Clinician: Referring Ashauna Bertholf: Treating Ascencion Stegner/Extender:Robson, Vista Lawman, Moses Manners in Treatment: 6 Vital Signs Time Taken: 13:13 Temperature (F): 97.7 Height (in): 58 Pulse (bpm): 67 Weight (lbs): 111 Respiratory Rate (breaths/min): 16 Body Mass Index (BMI): 23.2 Blood Pressure (mmHg): 149/78 Reference Range: 80 - 120 mg / dl Electronic Signature(s) Signed: 11/24/2019 6:18:44 PM By: Levan Hurst RN, BSN Entered By: Levan Hurst on 11/23/2019 13:14:43
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
## 2019-11-27 LAB — AEROBIC CULTURE W GRAM STAIN (SUPERFICIAL SPECIMEN)

## 2019-11-30 ENCOUNTER — Other Ambulatory Visit: Payer: Self-pay

## 2019-11-30 ENCOUNTER — Encounter (HOSPITAL_BASED_OUTPATIENT_CLINIC_OR_DEPARTMENT_OTHER): Payer: Medicare Other | Admitting: Internal Medicine

## 2019-11-30 DIAGNOSIS — L97212 Non-pressure chronic ulcer of right calf with fat layer exposed: Secondary | ICD-10-CM | POA: Diagnosis not present

## 2019-11-30 DIAGNOSIS — I890 Lymphedema, not elsewhere classified: Secondary | ICD-10-CM | POA: Diagnosis not present

## 2019-11-30 DIAGNOSIS — Z7901 Long term (current) use of anticoagulants: Secondary | ICD-10-CM | POA: Diagnosis not present

## 2019-11-30 DIAGNOSIS — B9620 Unspecified Escherichia coli [E. coli] as the cause of diseases classified elsewhere: Secondary | ICD-10-CM | POA: Diagnosis not present

## 2019-11-30 DIAGNOSIS — L03115 Cellulitis of right lower limb: Secondary | ICD-10-CM | POA: Diagnosis not present

## 2019-11-30 DIAGNOSIS — S81801A Unspecified open wound, right lower leg, initial encounter: Secondary | ICD-10-CM | POA: Diagnosis not present

## 2019-11-30 DIAGNOSIS — S8011XD Contusion of right lower leg, subsequent encounter: Secondary | ICD-10-CM | POA: Diagnosis not present

## 2019-11-30 DIAGNOSIS — I4891 Unspecified atrial fibrillation: Secondary | ICD-10-CM | POA: Diagnosis not present

## 2019-11-30 DIAGNOSIS — B9562 Methicillin resistant Staphylococcus aureus infection as the cause of diseases classified elsewhere: Secondary | ICD-10-CM | POA: Diagnosis not present

## 2019-11-30 DIAGNOSIS — I872 Venous insufficiency (chronic) (peripheral): Secondary | ICD-10-CM | POA: Diagnosis not present
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
## 2019-12-01 NOTE — Progress Notes (Addendum)
ANNLEIGH, KNUEPPEL ([id number]) Visit Report for 11/30/2019 Arrival Information Details Patient Name: Date of Service: [place], [place] MA [name] 11/30/2019 1:45 PM Medical Record Number: [id number] Patient Account Number: [id number] Date of Birth/Sex: Treating RN: Oct 15, 1936 (84 y.o. [name] Primary Care Cindia Hustead: [name] Other Clinician: Referring Collen Hostler: Treating Eldonna Neuenfeldt/Extender: Louis Meckel in Treatment: 7 Visit Information History Since Last Visit Added or deleted any medications: No Patient Arrived: [name] Any new allergies or adverse reactions: No Arrival Time: 14:00 Had a fall or experienced change in No Accompanied By: self activities of daily living that may affect Transfer Assistance: None risk of falls: Patient Has Alerts: Yes Signs or symptoms of abuse/neglect since last visito No Patient Alerts: Patient on Blood Thinner Hospitalized since last visit: No Implantable device outside of the clinic excluding No cellular tissue based products placed in the center since last visit: Has Dressing in Place as Prescribed: Yes Has Compression in Place as Prescribed: Yes Pain Present Now: No Electronic Signature(s) Signed: 11/30/2019 5:45:55 PM By: Kela Millin Entered By: Kela Millin on 11/30/2019 14:01:09 -------------------------------------------------------------------------------- Compression Therapy Details Patient Name: Date of Service: [name] 11/30/2019 1:45 PM Medical Record Number: [id number] Patient Account Number: [id number] Date of Birth/Sex: Treating RN: February 16, 1936 (84 y.o. [name] Primary Care Osama Coleson: [name] Other Clinician: Referring Lavaeh Bau: Treating Lavelle Berland/Extender: Louis Meckel in Treatment: 7 Compression Therapy Performed for Wound Assessment: Wound #1 Right,Distal,Posterior Lower Leg Performed By: Clinician Levan Hurst, RN Compression  Type: Three Layer Post Procedure Diagnosis Same as Pre-procedure Electronic Signature(s) Signed: 11/30/2019 6:02:32 PM By: Levan Hurst RN, BSN Entered By: Levan Hurst on 11/30/2019 14:38:22 -------------------------------------------------------------------------------- Encounter Discharge Information Details Patient Name: Date of Service: [place], [place] [name] 11/30/2019 1:45 PM Medical Record Number: [id number] Patient Account Number: [id number] Date of Birth/Sex: Treating RN: 13-Oct-1936 (84 y.o. [name], [name] Primary Care Tirza Senteno: [name] Other Clinician: Referring Olga Seyler: Treating Anyelo Mccue/Extender: Louis Meckel in Treatment: 7 Encounter Discharge Information Items Discharge Condition: Stable Ambulatory Status: Ambulatory Discharge Destination: Home Transportation: Private Auto Accompanied By: self Schedule Follow-up Appointment: Yes Clinical Summary of Care: Electronic Signature(s) Signed: 11/30/2019 5:45:39 PM By: Deon Pilling Entered By: Deon Pilling on 11/30/2019 15:03:30 -------------------------------------------------------------------------------- Lower Extremity Assessment Details Patient Name: Date of Service: [place], [place] MA [name] 11/30/2019 1:45 PM Medical Record Number: [id number] Patient Account Number: [id number] Date of Birth/Sex: Treating RN: 1935-12-29 (84 y.o. [name] Primary Care Lowell Mcgurk: [name] Other Clinician: Referring Andersen Iorio: Treating Dunbar Buras/Extender: Herma Ard, Moses Manners in Treatment: 7 Edema Assessment Assessed: [Left: No] [Right: No] Edema: [Left: Ye] [Right: s] Calf Left: Right: Point of Measurement: 37 cm From Medial Instep cm 33.5 cm Ankle Left: Right: Point of Measurement: 10 cm From Medial Instep cm 22 cm Vascular Assessment Pulses: Dorsalis Pedis Palpable: [Right:Yes] Electronic Signature(s) Signed: 11/30/2019 5:45:55 PM By: Kela Millin Entered By: Kela Millin on 11/30/2019 14:13:18 -------------------------------------------------------------------------------- Multi Wound Chart Details Patient Name: Date of Service: Eugenio Sa[place], [place] MA [name] 11/30/2019 1:45 PM Medical Record Number: [id number] Patient Account Number: [id number] Date of Birth/Sex: Treating RN: 05/12/1936 (84 y.o. F) Primary Care Kristion Holifield: [name] Other Clinician: Referring Dorothee Napierkowski: Treating Kais Monje/Extender: Herma Ard, Moses Manners in Treatment: 7 Vital Signs Height(in): 58 Pulse(bpm): 4 Weight(lbs): 111 Blood Pressure(mmHg): 134/85 Body Mass Index(BMI): 23 Temperature(F): 97.8 Respiratory Rate(breaths/min): 17 Photos: [1:No Photos Right Lower Leg - Posterior, Distal] [N/A:N/A N/A] Wound  Location: [1:Trauma] [N/A:N/A] Wounding Event: [1:Lymphedema] [N/A:N/A] Primary Etiology: [1:Congestive Heart Failure,] [N/A:N/A] Comorbid History: [1:Hypertension, Received Radiation 09/08/2019] [N/A:N/A] Date Acquired: [1:7] [N/A:N/A] Weeks of Treatment: [1:Open] [N/A:N/A] Wound Status: [1:1.4x4x0.8] [N/A:N/A] Measurements L x W x D (cm) [1:4.398] [N/A:N/A] A (cm) : rea [1:3.519] [N/A:N/A] Volume (cm) : [1:6.70%] [N/A:N/A] % Reduction in Area: [1:-647.10%] [N/A:N/A] % Reduction in Volume: [1:Full Thickness Without Exposed] [N/A:N/A] Classification: [1:Support Structures Medium] [N/A:N/A] Exudate Amount: [1:Purulent] [N/A:N/A] Exudate Type: [1:yellow, brown, green] [N/A:N/A] Exudate Color: [1:Well defined, not attached] [N/A:N/A] Wound Margin: [1:Large (67-100%)] [N/A:N/A] Granulation Amount: [1:Red, Pink] [N/A:N/A] Granulation Quality: [1:Small (1-33%)] [N/A:N/A] Necrotic Amount: [1:Fat Layer (Subcutaneous Tissue)] [N/A:N/A] Exposed Structures: [1:Exposed: Yes Fascia: No Tendon: No Muscle: No Joint: No Bone: No None] [N/A:N/A] Epithelialization: [1:Compression Therapy] [N/A:N/A] Treatment Notes Wound #1 (Right,  Distal, Posterior Lower Leg) 1. Cleanse With Wound Cleanser Soap and water 2. Periwound Care Moisturizing lotion 3. Primary Dressing Applied Calcium Alginate Ag 4. Secondary Dressing ABD Pad Dry Gauze 6. Support Layer Applied 3 layer compression wrap Electronic Signature(s) Signed: 12/01/2019 4:08:35 PM By: Linton Ham MD Entered By: Linton Ham on 11/30/2019 15:37:42 -------------------------------------------------------------------------------- Multi-Disciplinary Care Plan Details Patient Name: Date of Service: [place], [place] MA [name] 11/30/2019 1:45 PM Medical Record Number: [id number] Patient Account Number: [id number] Date of Birth/Sex: Treating RN: [birth year] (84 y.o. [name] Primary Care : [name] Other Clinician: Referring : Treating /Extender: Herma Ard, Moses Manners in Treatment: 7 Active Inactive Abuse / Safety / Falls / Self Care Management Nursing Diagnoses: Potential for falls Potential for injury related to falls Goals: Patient will not experience any injury related to falls Date Initiated: 10/12/2019 Target Resolution Date: 12/11/2019 Goal Status: Active Patient/caregiver will verbalize understanding of skin care regimen Date Initiated: 10/12/2019 Target Resolution Date: 12/11/2019 Goal Status: Active Patient/caregiver will verbalize/demonstrate measures taken to prevent injury and/or falls Date Initiated: 10/12/2019 Date Inactivated: 11/09/2019 Target Resolution Date: 11/13/2019 Goal Status: Met Interventions: Assess Activities of Daily Living upon admission and as needed Assess fall risk on admission and as needed Assess: immobility, friction, shearing, incontinence upon admission and as needed Assess impairment of mobility on admission and as needed per policy Assess personal safety and home safety (as indicated) on admission and as needed Assess self care needs on admission and as  needed Provide education on fall prevention Provide education on personal and home safety Notes: Wound/Skin Impairment Nursing Diagnoses: Impaired tissue integrity Knowledge deficit related to ulceration/compromised skin integrity Goals: Patient/caregiver will verbalize understanding of skin care regimen Date Initiated: 10/12/2019 Target Resolution Date: 12/11/2019 Goal Status: Active Ulcer/skin breakdown will have a volume reduction of 30% by week 4 Date Initiated: 10/12/2019 Date Inactivated: 11/09/2019 Target Resolution Date: 11/13/2019 Goal Status: Met Ulcer/skin breakdown will have a volume reduction of 50% by week 8 Date Initiated: 11/09/2019 Target Resolution Date: 12/11/2019 Goal Status: Active Interventions: Assess patient/caregiver ability to obtain necessary supplies Assess patient/caregiver ability to perform ulcer/skin care regimen upon admission and as needed Assess ulceration(s) every visit Provide education on ulcer and skin care Notes: Electronic Signature(s) Signed: 11/30/2019 6:02:32 PM By: Levan Hurst RN, BSN Entered By: Levan Hurst on 11/30/2019 14:37:48 -------------------------------------------------------------------------------- Pain Assessment Details Patient Name: Date of Service: [name] 11/30/2019 1:45 PM Medical Record Number: [id number] Patient Account Number: [id number] Date of Birth/Sex: Treating RN: 31-Jul-1936 (84 y.o. [name] Primary Care : [name] Other Clinician: Referring : Treating /Extender: Louis Meckel in Treatment: 7 Active Problems Location  of Pain Severity and Description of Pain Patient Has Paino No Site Locations Pain Management and Medication Current Pain Management: Electronic Signature(s) Signed: 11/30/2019 5:45:55 PM By: Kela Millin Entered By: Kela Millin on 11/30/2019  14:02:05 -------------------------------------------------------------------------------- Patient/Caregiver Education Details Patient Name: Date of Service: Beulah Gandy MA [name] 1/18/2021andnbsp1:45 PM Medical Record Number: [id number] Patient Account Number: [id number] Date of Birth/Gender: Treating RN: [birth year] (84 y.o. [name] Primary Care Physician: [name] Other Clinician: Referring Physician: Treating Physician/Extender: Louis Meckel in Treatment: 7 Education Assessment Education Provided To: Patient Education Topics Provided Wound/Skin Impairment: Methods: Explain/Verbal Responses: State content correctly [employer]) Signed: 11/30/2019 6:02:32 PM By: Levan Hurst RN, BSN Entered By: Levan Hurst on 11/30/2019 14:38:06 -------------------------------------------------------------------------------- Wound Assessment Details Patient Name: Date of Service: [place] [name] 11/30/2019 1:45 PM Medical Record Number: [id number] Patient Account Number: [id number] Date of Birth/Sex: Treating RN: January 05, 1936 (84 y.o. Orvan Falconer Primary Care Jermery Caratachea: [name] Other Clinician: Referring Tonye Tancredi: Treating Milany Geck/Extender: Herma Ard, Moses Manners in Treatment: 7 Wound Status Wound Number: 1 Primary Etiology: Lymphedema Wound Location: Right, Distal, Posterior Lower Leg Wound Status: Open Wounding Event: Trauma Comorbid Congestive Heart Failure, Hypertension, Received History: Radiation Date Acquired: 09/08/2019 Weeks Of Treatment: 7 Clustered Wound: No Photos Wound Measurements Length: (cm) 1.4 Width: (cm) 4 Depth: (cm) 0.8 Area: (cm) 4.398 Volume: (cm) 3.519 % Reduction in Area: 6.7% % Reduction in Volume: -647.1% Epithelialization: None Tunneling: No Undermining: No Wound Description Classification: Full Thickness Without Exposed Support Structures Wound Margin: Well  defined, not attached Exudate Amount: Medium Exudate Type: Purulent Exudate Color: yellow, brown, green Foul Odor After Cleansing: No Slough/Fibrino Yes Wound Bed Granulation Amount: Large (67-100%) Exposed Structure Granulation Quality: Red, Pink Fascia Exposed: No Necrotic Amount: Small (1-33%) Fat Layer (Subcutaneous Tissue) Exposed: Yes Necrotic Quality: Adherent Slough Tendon Exposed: No Muscle Exposed: No Joint Exposed: No Bone Exposed: No Electronic Signature(s) Signed: 05/10/2020 5:27:17 PM By: Carlene Coria RN Previous Signature: 12/03/2019 4:35:12 PM Version By: Mikeal Hawthorne EMT/HBOT Previous Signature: 12/03/2019 6:15:07 PM Version By: Kela Millin Previous Signature: 11/30/2019 5:45:55 PM Version By: Kela Millin Entered By: Carlene Coria on 12/07/2019 13:20:26 -------------------------------------------------------------------------------- Vitals Details Patient Name: Date of Service: [place] [name] 11/30/2019 1:45 PM Medical Record Number: [id number] Patient Account Number: [id number] Date of Birth/Sex: Treating RN: 08-15-1936 (84 y.o. [name] Primary Care Julion Gatt: [name] Other Clinician: Referring Ajanay Farve: Treating Mazie Fencl/Extender: Herma Ard, Moses Manners in Treatment: 7 Vital Signs Time Taken: 14:00 Temperature (F): 97.8 Height (in): 58 Pulse (bpm): 65 Weight (lbs): 111 Respiratory Rate (breaths/min): 17 Body Mass Index (BMI): 23.2 Blood Pressure (mmHg): 134/85 Reference Range: 80 - 120 mg / dl Electronic Signature(s) Signed: 11/30/2019 5:45:55 PM By: Kela Millin Entered By: Kela Millin on 11/30/2019 14:01:35

## 2019-12-07 ENCOUNTER — Encounter (HOSPITAL_BASED_OUTPATIENT_CLINIC_OR_DEPARTMENT_OTHER): Payer: Medicare Other | Attending: Internal Medicine | Admitting: Internal Medicine

## 2019-12-07 ENCOUNTER — Other Ambulatory Visit: Payer: Self-pay

## 2019-12-07 DIAGNOSIS — L97212 Non-pressure chronic ulcer of right calf with fat layer exposed: Secondary | ICD-10-CM | POA: Diagnosis not present

## 2019-12-07 DIAGNOSIS — I4891 Unspecified atrial fibrillation: Secondary | ICD-10-CM | POA: Diagnosis not present

## 2019-12-07 DIAGNOSIS — L03115 Cellulitis of right lower limb: Secondary | ICD-10-CM | POA: Diagnosis not present

## 2019-12-07 DIAGNOSIS — I890 Lymphedema, not elsewhere classified: Secondary | ICD-10-CM | POA: Diagnosis not present

## 2019-12-07 DIAGNOSIS — S8011XD Contusion of right lower leg, subsequent encounter: Secondary | ICD-10-CM | POA: Diagnosis not present

## 2019-12-07 DIAGNOSIS — S81801A Unspecified open wound, right lower leg, initial encounter: Secondary | ICD-10-CM | POA: Diagnosis not present

## 2019-12-07 DIAGNOSIS — I872 Venous insufficiency (chronic) (peripheral): Secondary | ICD-10-CM | POA: Diagnosis not present

## 2019-12-07 DIAGNOSIS — Z7901 Long term (current) use of anticoagulants: Secondary | ICD-10-CM | POA: Diagnosis not present

## 2019-12-08 NOTE — Progress Notes (Signed)
SHANISHA, LECH ([id number]) Visit Report for 12/07/2019 Arrival Information Details Patient Name: Date of Service: KENSLEE, ACHORN 12/07/2019 12:30 PM Medical Record [id number] Patient Account Number: [id number] Date of Birth/Sex: Treating RN: [birth year] (84 y.o. Orvan Falconer Primary Care Dequincy Born: [name] Other Clinician: Referring Tamea Bai: Treating Estephania Licciardi/Extender:Robson, Vista Lawman, Moses Manners in Treatment: 8 Visit Information History Since Last Visit All ordered tests and consults were completed: No Patient Arrived: Ambulatory Added or deleted any medications: No Arrival Time: 13:10 Any new allergies or adverse reactions: No Accompanied By: self Had a fall or experienced change in No Transfer Assistance: None activities of daily living that may affect Patient Identification Verified: Yes risk of falls: Secondary Verification Process Yes Signs or symptoms of abuse/neglect since last No Completed: visito Patient Has Alerts: Yes Hospitalized since last visit: No Patient Alerts: Patient on Blood Implantable device outside of the clinic excluding No Thinner cellular tissue based products placed in the center since last visit: Has Dressing in Place as Prescribed: Yes Has Compression in Place as Prescribed: Yes Pain Present Now: No Electronic Signature(s) Signed: 12/07/2019 6:01:30 PM By: Carlene Coria RN Entered By: Carlene Coria on 12/07/2019 13:11:42 -------------------------------------------------------------------------------- Compression Therapy Details Patient Name: Date of Service: MELENA, HAYES 12/07/2019 12:30 PM Medical Record [id number] Patient Account Number: [id number] Date of Birth/Sex: Treating RN: 02-03-1936 (84 y.o. [name] Primary Care Corbin Hott: [name] Other Clinician: Referring Ravleen Ries: Treating Semaje Kinker/Extender:Robson, Vista Lawman, Moses Manners in Treatment: 8 Compression Therapy  Performed for Wound Wound #1 Right,Distal,Posterior Lower Leg Assessment: Performed By: Clinician Levan Hurst, RN Compression Type: Three Layer Post Procedure Diagnosis Same as Pre-procedure Electronic Signature(s) Signed: 12/07/2019 6:46:58 PM By: Levan Hurst RN, BSN Entered By: Levan Hurst on 12/07/2019 13:43:36 -------------------------------------------------------------------------------- Encounter Discharge Information Details Patient Name: Date of Service: [name] 12/07/2019 12:30 PM Medical Record [id number] Patient Account Number: [id number] Date of Birth/Sex: Treating RN: 24-Jul-1936 (84 y.o. [name], [name] Primary Care Meta Kroenke: [name] Other Clinician: Referring Izabell Schalk: Treating Julie-Ann Vanmaanen/Extender:Robson, Vista Lawman, Moses Manners in Treatment: 8 Encounter Discharge Information Items Discharge Condition: Stable Ambulatory Status: Walker Discharge Destination: Home Transportation: Private Auto Accompanied By: self Schedule Follow-up Appointment: Yes Clinical Summary of Care: Electronic Signature(s) Signed: 12/07/2019 5:49:19 PM By: Deon Pilling Entered By: Deon Pilling on 12/07/2019 13:55:31 -------------------------------------------------------------------------------- Lower Extremity Assessment Details Patient Name: Date of Service: ADER, FRITZE 12/07/2019 12:30 PM Medical Record [id number] Patient Account Number: [id number] Date of Birth/Sex: Treating RN: [birth year] (84 y.o. Orvan Falconer Primary Care Aizlyn Schifano: [name] Other Clinician: Referring Adoni Greenough: Treating Isabelly Kobler/Extender:Robson, Vista Lawman, Moses Manners in Treatment: 8 Edema Assessment Assessed: [Left: No] [Right: No] Edema: [Left: Ye] [Right: s] Calf Left: Right: Point of Measurement: 37 cm From Medial Instep cm 33 cm Ankle Left: Right: Point of Measurement: 10 cm From Medial Instep cm 22 cm Electronic Signature(s) Signed:  12/07/2019 6:01:30 PM By: Carlene Coria RN Entered By: Carlene Coria on 12/07/2019 13:20:09 -------------------------------------------------------------------------------- Multi Wound Chart Details Patient Name: Date of Service: [name] 12/07/2019 12:30 PM Medical Record [id number] Patient Account Number: [id number] Date of Birth/Sex: Treating RN: 1936-09-08 (84 y.o. F) Primary Care Ajai Terhaar: [name] Other Clinician: Referring Muhanad Torosyan: Treating Crystal Scarberry/Extender:Robson, Vista Lawman, Moses Manners in Treatment: 8 Vital Signs Height(in): 58 Pulse(bpm): 76 Weight(lbs): 111 Blood Pressure(mmHg): 139/82 Body Mass Index(BMI): 23 Temperature(F): 97.8 Respiratory 18 Rate(breaths/min): Photos: [1:No Photos] [N/A:N/A] Wound Location: [1:Right Lower Leg - Posterior, Distal] [N/A:N/A] Wounding Event: [1:Trauma] [N/A:N/A] Primary Etiology: [1:Lymphedema] [  N/A:N/A] Comorbid History: [1:Congestive Heart Failure, Hypertension, Received Radiation] [N/A:N/A] Date Acquired: [1:09/08/2019] [N/A:N/A] Weeks of Treatment: [1:8] [N/A:N/A] Wound Status: [1:Open] [N/A:N/A] Measurements L x W x D 1.5x1x0.1 [N/A:N/A] (cm) Area (cm) : [1:1.178] [N/A:N/A] Volume (cm) : [1:0.118] [N/A:N/A] % Reduction in Area: [1:75.00%] [N/A:N/A] % Reduction in Volume: 74.90% [N/A:N/A] Position 1 (o'clock): 9 Maximum Distance 1 [1:2.2] (cm): Tunneling: [1:Yes] [N/A:N/A] Classification: [1:Full Thickness Without Exposed Support Structures] [N/A:N/A] Exudate Amount: [1:Medium] [N/A:N/A] Exudate Type: [1:Serosanguineous] [N/A:N/A] Exudate Color: [1:red, brown] [N/A:N/A] Wound Margin: [1:Well defined, not attached N/A] Granulation Amount: [1:Large (67-100%)] [N/A:N/A] Granulation Quality: [1:Red, Pink] [N/A:N/A] Necrotic Amount: [1:Small (1-33%)] [N/A:N/A] Exposed Structures: [1:Fat Layer (Subcutaneous N/A Tissue) Exposed: Yes Fascia: No Tendon: No Muscle: No Joint: No Bone:  No] Epithelialization: [1:Small (1-33%) Compression Therapy] [N/A:N/A N/A] Treatment Notes Wound #1 (Right, Distal, Posterior Lower Leg) 1. Cleanse With Wound Cleanser Soap and water 2. Periwound Care Moisturizing lotion 3. Primary Dressing Applied Calcium Alginate Ag 4. Secondary Dressing ABD Pad 6. Support Layer Applied 3 layer compression wrap Notes netting. Electronic Signature(s) Signed: 12/08/2019 5:32:11 PM By: Linton Ham MD Entered By: Linton Ham on 12/07/2019 13:58:13 -------------------------------------------------------------------------------- Multi-Disciplinary Care Plan Details Patient Name: Date of Service: [name] 12/07/2019 12:30 PM Medical Record [id number] Patient Account Number: [id number] Date of Birth/Sex: Treating RN: [birth year] (84 y.o. [name] Primary Care Veer Elamin: [name] Other Clinician: Referring Louvinia Cumbo: Treating Autie Vasudevan/Extender:Robson, Vista Lawman, Moses Manners in Treatment: 8 Active Inactive Abuse / Safety / Falls / Self Care Management Nursing Diagnoses: Potential for falls Potential for injury related to falls Goals: Patient will not experience any injury related to falls Date Initiated: 10/12/2019 Target Resolution Date: 01/08/2020 Goal Status: Active Patient/caregiver will verbalize understanding of skin care regimen Date Initiated: 10/12/2019 Date Inactivated: 12/07/2019 Target Resolution Date: 12/11/2019 Goal Status: Met Patient/caregiver will verbalize/demonstrate measures taken to prevent injury and/or falls Date Initiated: 10/12/2019 Date Inactivated: 11/09/2019 Target Resolution Date: 11/13/2019 Goal Status: Met Interventions: Assess Activities of Daily Living upon admission and as needed Assess fall risk on admission and as needed Assess: immobility, friction, shearing, incontinence upon admission and as needed Assess impairment of mobility on admission and as needed per  policy Assess personal safety and home safety (as indicated) on admission and as needed Assess self care needs on admission and as needed Provide education on fall prevention Provide education on personal and home safety Notes: Wound/Skin Impairment Nursing Diagnoses: Impaired tissue integrity Knowledge deficit related to ulceration/compromised skin integrity Goals: Patient/caregiver will verbalize understanding of skin care regimen Date Initiated: 10/12/2019 Target Resolution Date: 01/08/2020 Goal Status: Active Ulcer/skin breakdown will have a volume reduction of 30% by week 4 Date Initiated: 10/12/2019 Date Inactivated: 11/09/2019 Target Resolution Date: 11/13/2019 Goal Status: Met Ulcer/skin breakdown will have a volume reduction of 50% by week 8 Date Initiated: 11/09/2019 Date Inactivated: 12/07/2019 Target Resolution Date: 12/11/2019 Goal Status: Met Ulcer/skin breakdown will have a volume reduction of 80% by week 12 Date Initiated: 12/07/2019 Target Resolution Date: 01/08/2020 Goal Status: Active Interventions: Assess patient/caregiver ability to obtain necessary supplies Assess patient/caregiver ability to perform ulcer/skin care regimen upon admission and as needed Assess ulceration(s) every visit Provide education on ulcer and skin care Notes: Electronic Signature(s) Signed: 12/07/2019 6:46:58 PM By: Levan Hurst RN, BSN Entered By: Levan Hurst on 12/07/2019 13:43:07 -------------------------------------------------------------------------------- Pain Assessment Details Patient Name: Date of Service: [name] 12/07/2019 12:30 PM Medical Record [id number] Patient Account Number: [id number] Date of Birth/Sex: Treating RN: 1936-04-28 (84 y.o. F)  Carlene Coria Primary Care Tannis Burstein: [name] Other Clinician: Referring Jorryn Casagrande: Treating Cynthia Stainback/Extender:Robson, Vista Lawman, Moses Manners in Treatment: 8 Active Problems Location of Pain Severity  and Description of Pain Patient Has Paino No Site Locations Pain Management and Medication Current Pain Management: Electronic Signature(s) Signed: 12/07/2019 6:01:30 PM By: Carlene Coria RN Entered By: Carlene Coria on 12/07/2019 13:12:33 -------------------------------------------------------------------------------- Patient/Caregiver Education Details Patient Name: Tamala Julian, ROSE [name] Date of Service: 1/25/2021andnbsp12:30 PM Medical Record Patient Account Number: [id number] Number: [id number] Number: Treating RN: Levan Hurst Date of Birth/Gender: December 10, 1935 (84 y.o. F) Other Clinician: Primary Care Physician: Lyanne Co Referring Physician: Physician/Extender: Lavena Bullion in Treatment: 8 Education Assessment Education Provided To: Patient Education Topics Provided Wound/Skin Impairment: Methods: Explain/Verbal Responses: State content correctly Electronic Signature(s) Signed: 12/07/2019 6:46:58 PM By: Levan Hurst RN, BSN Entered By: Levan Hurst on 12/07/2019 18:25:42 -------------------------------------------------------------------------------- Wound Assessment Details Patient Name: Date of Service: [name] 12/07/2019 12:30 PM Medical Record [id number] Patient Account Number: [id number] Date of Birth/Sex: Treating RN: [birth year] (84 y.o. F) Primary Care Kacey Vicuna: [name] Other Clinician: Referring Tryce Surratt: Treating Bijan Ridgley/Extender:Robson, Vista Lawman, Moses Manners in Treatment: 8 Wound Status Wound Number: 1 Primary Lymphedema Etiology: Wound Location: Right Lower Leg - Posterior, Distal Wound Open Wounding Event: Trauma Status: Date Acquired: 09/08/2019 Comorbid Congestive Heart Failure, Hypertension, Weeks Of Treatment: 8 History: Received Radiation Clustered Wound: No Photos Wound Measurements Length: (cm) 1.5 Width: (cm) 1 Depth: (cm) 0.1 Area: (cm) 1.178 Volume:  (cm) 0.118 % Reduction in Area: 75% % Reduction in Volume: 74.9% Epithelialization: Small (1-33%) Tunneling: Yes Position (o'clock): 9 Maximum Distance: (cm) 2.2 Undermining: No Wound Description Classification: Full Thickness Without Exposed Support Foul Odo Structures Slough/F Wound Well defined, not attached Margin: Exudate Medium Amount: Exudate Serosanguineous Type: Exudate red, brown Color: Wound Bed Granulation Amount: Large (67-100%) Granulation Quality: Red, Pink Fascia E Necrotic Amount: Small (1-33%) Fat Laye Necrotic Quality: Adherent Slough Tendon E Muscle E Joint Ex Bone Exp r After Cleansing: No ibrino Yes Exposed Structure xposed: No r (Subcutaneous Tissue) Exposed: Yes xposed: No xposed: No posed: No osed: No Treatment Notes Wound #1 (Right, Distal, Posterior Lower Leg) 1. Cleanse With Wound Cleanser Soap and water 2. Periwound Care Moisturizing lotion 3. Primary Dressing Applied Calcium Alginate Ag 4. Secondary Dressing ABD Pad 6. Support Layer Applied 3 layer compression wrap Notes netting. Electronic Signature(s) Signed: 12/08/2019 5:09:30 PM By: Mikeal Hawthorne EMT/HBOT Previous Signature: 12/07/2019 6:01:30 PM Version By: Carlene Coria RN Entered By: Mikeal Hawthorne on 12/08/2019 15:06:02 -------------------------------------------------------------------------------- Vitals Details Patient Name: Date of Service: [name] 12/07/2019 12:30 PM Medical Record [id number] Patient Account Number: [id number] Date of Birth/Sex: Treating RN: 1936/05/05 (84 y.o. Orvan Falconer Primary Care Bond Grieshop: [name] Other Clinician: Referring Murlene Revell: Treating Roselina Burgueno/Extender:Robson, Vista Lawman, Moses Manners in Treatment: 8 Vital Signs Time Taken: 13:11 Temperature (F): 97.8 Height (in): 58 Pulse (bpm): 72 Weight (lbs): 111 Respiratory Rate (breaths/min): 18 Body Mass Index (BMI): 23.2 Blood Pressure (mmHg):  139/82 Reference Range: 80 - 120 mg / dl Electronic Signature(s) Signed: 12/07/2019 6:01:30 PM By: Carlene Coria RN Entered By: Carlene Coria on 12/07/2019 13:12:25
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
## 2019-12-14 ENCOUNTER — Other Ambulatory Visit: Payer: Self-pay

## 2019-12-14 ENCOUNTER — Encounter (HOSPITAL_BASED_OUTPATIENT_CLINIC_OR_DEPARTMENT_OTHER): Payer: Medicare Other | Admitting: Internal Medicine

## 2019-12-14 DIAGNOSIS — I351 Nonrheumatic aortic (valve) insufficiency: Secondary | ICD-10-CM | POA: Insufficient documentation

## 2019-12-14 DIAGNOSIS — I110 Hypertensive heart disease with heart failure: Secondary | ICD-10-CM | POA: Diagnosis not present

## 2019-12-14 DIAGNOSIS — Z853 Personal history of malignant neoplasm of breast: Secondary | ICD-10-CM | POA: Insufficient documentation

## 2019-12-14 DIAGNOSIS — I890 Lymphedema, not elsewhere classified: Secondary | ICD-10-CM | POA: Diagnosis not present

## 2019-12-14 DIAGNOSIS — I4891 Unspecified atrial fibrillation: Secondary | ICD-10-CM | POA: Insufficient documentation

## 2019-12-14 DIAGNOSIS — Z85828 Personal history of other malignant neoplasm of skin: Secondary | ICD-10-CM | POA: Insufficient documentation

## 2019-12-14 DIAGNOSIS — Z7901 Long term (current) use of anticoagulants: Secondary | ICD-10-CM | POA: Insufficient documentation

## 2019-12-14 DIAGNOSIS — I5032 Chronic diastolic (congestive) heart failure: Secondary | ICD-10-CM | POA: Diagnosis not present

## 2019-12-14 DIAGNOSIS — L03115 Cellulitis of right lower limb: Secondary | ICD-10-CM | POA: Insufficient documentation

## 2019-12-14 DIAGNOSIS — S81801A Unspecified open wound, right lower leg, initial encounter: Secondary | ICD-10-CM | POA: Diagnosis not present

## 2019-12-14 DIAGNOSIS — L97212 Non-pressure chronic ulcer of right calf with fat layer exposed: Secondary | ICD-10-CM | POA: Insufficient documentation
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
## 2019-12-15 NOTE — Progress Notes (Addendum)
[name], [name] ([id number]) Visit Report for 12/14/2019 Arrival Information Details Patient Name: Date of Service: [name], [name] 12/14/2019 12:30 PM Medical Record [id number] Patient Account Number: [id number] Date of Birth/Sex: Treating RN: December 03, 1935 (84 y.o. Orvan Falconer Primary Care Alter Moss: [name] Other Clinician: Referring Taelon Bendorf: Treating Broedy Osbourne/Extender:Robson, Vista Lawman, Moses Manners in Treatment: 9 Visit Information History Since Last Visit All ordered tests and consults were completed: No Patient Arrived: [name] Added or deleted any medications: No Arrival Time: 13:03 Any new allergies or adverse reactions: No Accompanied By: self Had a fall or experienced change in No Transfer Assistance: None activities of daily living that may affect Patient Identification Verified: Yes risk of falls: Secondary Verification Process Yes Signs or symptoms of abuse/neglect since last No Completed: visito Patient Has Alerts: Yes Hospitalized since last visit: No Patient Alerts: Patient on Blood Implantable device outside of the clinic excluding No Thinner cellular tissue based products placed in the center since last visit: Has Dressing in Place as Prescribed: Yes Has Compression in Place as Prescribed: Yes Pain Present Now: No Electronic Signature(s) Signed: 12/15/2019 5:23:18 PM By: Carlene Coria RN Entered By: Carlene Coria on 12/14/2019 13:14:50 -------------------------------------------------------------------------------- Compression Therapy Details Patient Name: Date of Service: AMBIKA, ZETTLEMOYER 12/14/2019 12:30 PM Medical Record [id number] Patient Account Number: [id number] Date of Birth/Sex: Treating RN: February 19, 1936 (84 y.o. [name] Primary Care Orris Perin: [name] Other Clinician: Referring Patti Shorb: Treating Kaytlin Burklow/Extender:Robson, Vista Lawman, Moses Manners in Treatment: 9 Compression Therapy Performed  for Wound Wound #1 Right,Distal,Posterior Lower Leg Assessment: Performed By: Clinician Levan Hurst, RN Compression Type: Three Layer Post Procedure Diagnosis Same as Pre-procedure Electronic Signature(s) Signed: 12/14/2019 6:39:44 PM By: Levan Hurst RN, BSN Entered By: Levan Hurst on 12/14/2019 13:49:03 -------------------------------------------------------------------------------- Encounter Discharge Information Details Patient Name: Date of Service: Annette Heads. 12/14/2019 12:30 PM Medical Record [id number] Patient Account Number: [id number] Date of Birth/Sex: Treating RN: Nov 21, 1935 (84 y.o. [name] Primary Care Lenny Fiumara: [name] Other Clinician: Referring Clea Dubach: Treating Allena Pietila/Extender:Robson, Vista Lawman, Moses Manners in Treatment: 9 Encounter Discharge Information Items Discharge Condition: Stable Ambulatory Status: Walker Discharge Destination: Home Transportation: Private Auto Accompanied By: self Schedule Follow-up Appointment: Yes Clinical Summary of Care: Patient Declined Electronic Signature(s) Signed: 12/14/2019 6:01:02 PM By: Kela Millin Entered By: Kela Millin on 12/14/2019 14:04:33 -------------------------------------------------------------------------------- Lower Extremity Assessment Details Patient Name: Date of Service: [name], [name] 12/14/2019 12:30 PM Medical Record [id number] Patient Account Number: [id number] Date of Birth/Sex: Treating RN: 10-30-1936 (84 y.o. Orvan Falconer Primary Care Krystan Northrop: [name] Other Clinician: Referring Shy Guallpa: Treating Dabney Dever/Extender:Robson, Vista Lawman, Moses Manners in Treatment: 9 Edema Assessment Assessed: [Left: No] [Right: No] Edema: [Left: Ye] [Right: s] Calf Left: Right: Point of Measurement: 37 cm From Medial Instep cm 32 cm Ankle Left: Right: Point of Measurement: 10 cm From Medial Instep cm 20 cm Electronic  Signature(s) Signed: 12/15/2019 5:23:18 PM By: Carlene Coria RN Entered By: Carlene Coria on 12/14/2019 13:15:51 -------------------------------------------------------------------------------- Multi Wound Chart Details Patient Name: Date of Service: Annette Mallet C. 12/14/2019 12:30 PM Medical Record [id number] Patient Account Number: [id number] Date of Birth/Sex: Treating RN: 06/21/1936 (84 y.o. F) Primary Care Slayde Brault: [name] Other Clinician: Referring Kinzlee Selvy: Treating Kristain Filo/Extender:Robson, Vista Lawman, Moses Manners in Treatment: 9 Vital Signs Height(in): 58 Pulse(bpm): 37 Weight(lbs): 111 Blood Pressure(mmHg): 151/79 Body Mass Index(BMI): 23 Temperature(F): 97.7 Respiratory 18 Rate(breaths/min): Photos: [1:No Photos] [N/A:N/A] Wound Location: [1:Right Lower Leg - Posterior, Distal] [N/A:N/A] Wounding Event: [1:Trauma] [N/A:N/A] Primary  Etiology: [1:Lymphedema] [N/A:N/A] Comorbid History: [1:Congestive Heart Failure, Hypertension, Received Radiation] [N/A:N/A] Date Acquired: [1:09/08/2019] [N/A:N/A] Weeks of Treatment: [1:9] [N/A:N/A] Wound Status: [1:Open] [N/A:N/A] Measurements L x W x D 0.7x0.5x0.7 [N/A:N/A] (cm) Area (cm) : [1:0.275] [N/A:N/A] Volume (cm) : [1:0.192] [N/A:N/A] % Reduction in Area: [1:94.20%] [N/A:N/A] % Reduction in Volume: 59.20% [N/A:N/A] Position 1 (o'clock): 9 Maximum Distance 1 [1:1.5] (cm): Tunneling: [1:Yes] [N/A:N/A] Classification: [1:Full Thickness Without Exposed Support Structures] [N/A:N/A] Exudate Amount: [1:Medium] [N/A:N/A] Exudate Type: [1:Serosanguineous] [N/A:N/A] Exudate Color: [1:red, brown] [N/A:N/A] Wound Margin: [1:Well defined, not attached N/A] Granulation Amount: [1:Large (67-100%)] [N/A:N/A] Granulation Quality: [1:Red, Pink] [N/A:N/A] Necrotic Amount: [1:Small (1-33%)] [N/A:N/A] Exposed Structures: [1:Fat Layer (Subcutaneous N/A Tissue) Exposed: Yes Fascia: No Tendon: No Muscle: No Joint:  No Bone: No] Epithelialization: [1:Small (1-33%) Compression Therapy] [N/A:N/A N/A] Treatment Notes Wound #1 (Right, Distal, Posterior Lower Leg) 1. Cleanse With Wound Cleanser Soap and water 2. Periwound Care Moisturizing lotion 3. Primary Dressing Applied Calcium Alginate Ag 4. Secondary Dressing ABD Pad Dry Gauze 6. Support Layer Applied 3 layer compression wrap Notes netting. Electronic Signature(s) Signed: 12/14/2019 6:41:12 PM By: Linton Ham MD Entered By: Linton Ham on 12/14/2019 14:13:42 -------------------------------------------------------------------------------- Multi-Disciplinary Care Plan Details Patient Name: Date of Service: Annette Blinks [name] 12/14/2019 12:30 PM Medical Record [id number] Patient Account Number: [id number] Date of Birth/Sex: Treating RN: 19-Sep-1936 (84 y.o. [name] Primary Care Keawe Marcello: [name] Other Clinician: Referring Melodye Swor: Treating Demontae Antunes/Extender:Robson, Vista Lawman, Moses Manners in Treatment: 9 Active Inactive Abuse / Safety / Falls / Self Care Management Nursing Diagnoses: Potential for falls Potential for injury related to falls Goals: Patient will not experience any injury related to falls Date Initiated: 10/12/2019 Target Resolution Date: 01/08/2020 Goal Status: Active Patient/caregiver will verbalize understanding of skin care regimen Date Initiated: 10/12/2019 Date Inactivated: 12/07/2019 Target Resolution Date: 12/11/2019 Goal Status: Met Patient/caregiver will verbalize/demonstrate measures taken to prevent injury and/or falls Date Initiated: 10/12/2019 Date Inactivated: 11/09/2019 Target Resolution Date: 11/13/2019 Goal Status: Met Interventions: Assess Activities of Daily Living upon admission and as needed Assess fall risk on admission and as needed Assess: immobility, friction, shearing, incontinence upon admission and as needed Assess impairment of mobility on admission and as  needed per policy Assess personal safety and home safety (as indicated) on admission and as needed Assess self care needs on admission and as needed Provide education on fall prevention Provide education on personal and home safety Notes: Wound/Skin Impairment Nursing Diagnoses: Impaired tissue integrity Knowledge deficit related to ulceration/compromised skin integrity Goals: Patient/caregiver will verbalize understanding of skin care regimen Date Initiated: 10/12/2019 Target Resolution Date: 01/08/2020 Goal Status: Active Ulcer/skin breakdown will have a volume reduction of 30% by week 4 Date Initiated: 10/12/2019 Date Inactivated: 11/09/2019 Target Resolution Date: 11/13/2019 Goal Status: Met Ulcer/skin breakdown will have a volume reduction of 50% by week 8 Date Initiated: 11/09/2019 Date Inactivated: 12/07/2019 Target Resolution Date: 12/11/2019 Goal Status: Met Ulcer/skin breakdown will have a volume reduction of 80% by week 12 Date Initiated: 12/07/2019 Target Resolution Date: 01/08/2020 Goal Status: Active Interventions: Assess patient/caregiver ability to obtain necessary supplies Assess patient/caregiver ability to perform ulcer/skin care regimen upon admission and as needed Assess ulceration(s) every visit Provide education on ulcer and skin care Notes: Electronic Signature(s) Signed: 12/14/2019 6:39:44 PM By: Levan Hurst RN, BSN Entered By: Levan Hurst on 12/14/2019 13:29:07 -------------------------------------------------------------------------------- Pain Assessment Details Patient Name: Date of Service: Annette Heads. 12/14/2019 12:30 PM Medical Record [id number] Patient Account Number: [id number] Date of Birth/Sex: Treating RN:  [birth year] (84 y.o. Orvan Falconer Primary Care Madalene Mickler: [name] Other Clinician: Referring Avik Leoni: Treating Bryam Taborda/Extender:Robson, Vista Lawman, Moses Manners in Treatment: 9 Active Problems Location of Pain  Severity and Description of Pain Patient Has Paino No Site Locations Pain Management and Medication Current Pain Management: Electronic Signature(s) Signed: 12/15/2019 5:23:18 PM By: Carlene Coria RN Entered By: Carlene Coria on 12/14/2019 13:15:38 -------------------------------------------------------------------------------- Patient/Caregiver Education Details Patient Name: [name], [name] Date of Service: 2/1/2021andnbsp12:30 PM Medical Record [id number] Patient Account Number: [id number] Date of Birth/Gender: Treating RN: 08/05/1936 (84 y.o. [name] Primary Care Physician: [name] Other Clinician: Referring Physician: Treating Physician/Extender:Robson, Vista Lawman, Moses Manners in Treatment: 9 Education Assessment Education Provided To: Patient Education Topics Provided Wound/Skin Impairment: Methods: Explain/Verbal Responses: State content correctly [employer]) Signed: 12/14/2019 6:39:44 PM By: Levan Hurst RN, BSN Entered By: Levan Hurst on 12/14/2019 13:29:35 -------------------------------------------------------------------------------- Wound Assessment Details Patient Name: Date of Service: Annette Heads 12/14/2019 12:30 PM Medical Record [id number] Patient Account Number: [id number] Date of Birth/Sex: Treating RN: 1936/04/27 (84 y.o. F) Primary Care Arraya Buck: [name] Other Clinician: Referring Rollyn Scialdone: Treating Eleno Weimar/Extender:Robson, Vista Lawman, Moses Manners in Treatment: 9 Wound Status Wound Number: 1 Primary Lymphedema Etiology: Wound Location: Right Lower Leg - Posterior, Distal Wound Open Wounding Event: Trauma Status: Date Acquired: 09/08/2019 Comorbid Congestive Heart Failure, Hypertension, Weeks Of Treatment: 9 History: Received Radiation Clustered Wound: No Photos Wound Measurements Length: (cm) 0.7 Width: (cm) 0.5 Depth: (cm) 0.7 Area: (cm) 0.275 Volume: (cm) 0.192 %  Reduction in Area: 94.2% % Reduction in Volume: 59.2% Epithelialization: Small (1-33%) Tunneling: Yes Position (o'clock): 9 Maximum Distance: (cm) 1.5 Undermining: No Wound Description Classification: Full Thickness Without Exposed Support Foul Odo Structures Slough/F Wound Well defined, not attached Margin: Exudate Medium Amount: Exudate Serosanguineous Type: Exudate red, brown Color: Wound Bed Granulation Amount: Large (67-100%) Granulation Quality: Red, Pink Fascia E Necrotic Amount: Small (1-33%) Fat Laye Necrotic Quality: Adherent Slough Tendon E Muscle E Joint Ex Bone Exp r After Cleansing: No ibrino Yes Exposed Structure xposed: No r (Subcutaneous Tissue) Exposed: Yes xposed: No xposed: No posed: No osed: No Treatment Notes Wound #1 (Right, Distal, Posterior Lower Leg) 1. Cleanse With Wound Cleanser Soap and water 2. Periwound Care Moisturizing lotion 3. Primary Dressing Applied Calcium Alginate Ag 4. Secondary Dressing ABD Pad Dry Gauze 6. Support Layer Applied 3 layer compression wrap Notes netting. Electronic Signature(s) Signed: 12/16/2019 4:30:25 PM By: Mikeal Hawthorne EMT/HBOT Previous Signature: 12/15/2019 5:23:18 PM Version By: Carlene Coria RN Entered By: Mikeal Hawthorne on 12/16/2019 15:34:40 -------------------------------------------------------------------------------- Vitals Details Patient Name: Date of Service: Annette Blinks [name] 12/14/2019 12:30 PM Medical Record [id number] Patient Account Number: [id number] Date of Birth/Sex: Treating RN: 1936-01-04 (84 y.o. Orvan Falconer Primary Care Aulani Shipton: [name] Other Clinician: Referring Innocence Schlotzhauer: Treating Nakyia Dau/Extender:Robson, Vista Lawman, Moses Manners in Treatment: 9 Vital Signs Time Taken: 13:15 Temperature (F): 97.7 Height (in): 58 Pulse (bpm): 67 Weight (lbs): 111 Respiratory Rate (breaths/min): 18 Body Mass Index (BMI): 23.2 Blood Pressure (mmHg):  151/79 Reference Range: 80 - 120 mg / dl Electronic Signature(s) Signed: 12/15/2019 5:23:18 PM By: Carlene Coria RN Entered By: Carlene Coria on 12/14/2019 13:15:31

## 2019-12-21 ENCOUNTER — Encounter (HOSPITAL_BASED_OUTPATIENT_CLINIC_OR_DEPARTMENT_OTHER): Payer: Medicare Other | Attending: Internal Medicine | Admitting: Internal Medicine

## 2019-12-21 ENCOUNTER — Other Ambulatory Visit: Payer: Self-pay

## 2019-12-21 ENCOUNTER — Other Ambulatory Visit (HOSPITAL_COMMUNITY)
Admission: RE | Admit: 2019-12-21 | Discharge: 2019-12-21 | Disposition: A | Discharge status: Home / Self Care | Payer: Medicare Other | Source: Other Acute Inpatient Hospital | Attending: Internal Medicine | Admitting: Internal Medicine

## 2019-12-21 DIAGNOSIS — S81801A Unspecified open wound, right lower leg, initial encounter: Secondary | ICD-10-CM | POA: Diagnosis not present

## 2019-12-21 DIAGNOSIS — L03115 Cellulitis of right lower limb: Secondary | ICD-10-CM | POA: Diagnosis not present

## 2019-12-21 DIAGNOSIS — S8011XD Contusion of right lower leg, subsequent encounter: Secondary | ICD-10-CM | POA: Diagnosis not present

## 2019-12-21 DIAGNOSIS — I351 Nonrheumatic aortic (valve) insufficiency: Secondary | ICD-10-CM | POA: Diagnosis not present

## 2019-12-21 DIAGNOSIS — L97212 Non-pressure chronic ulcer of right calf with fat layer exposed: Secondary | ICD-10-CM | POA: Diagnosis not present

## 2019-12-21 DIAGNOSIS — I4891 Unspecified atrial fibrillation: Secondary | ICD-10-CM | POA: Diagnosis not present

## 2019-12-21 DIAGNOSIS — I890 Lymphedema, not elsewhere classified: Secondary | ICD-10-CM | POA: Diagnosis not present

## 2019-12-21 DIAGNOSIS — I110 Hypertensive heart disease with heart failure: Secondary | ICD-10-CM | POA: Diagnosis not present

## 2019-12-22 ENCOUNTER — Other Ambulatory Visit: Payer: Self-pay | Admitting: Cardiology
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
## 2019-12-22 NOTE — Progress Notes (Addendum)
[name], [name] ([id number]) Visit Report for 12/21/2019 Arrival Information Details Patient Name: Date of Service: [name], [name] 12/21/2019 1:45 PM Medical Record [id number] Patient Account Number: [id number] Date of Birth/Sex: Treating RN: 1935/12/19 (84 y.o. [name], [name] Primary Care Frisco Cordts: [name] Other Clinician: Referring Fermin Yan: Treating Nevaan Bunton/Extender:Robson, Vista Lawman, Moses Manners in Treatment: 10 Visit Information History Since Last Visit Added or deleted any medications: No Patient Arrived: [name] Any new allergies or adverse reactions: No Arrival Time: 13:40 Had a fall or experienced change in No Accompanied By: self activities of daily living that may affect Transfer Assistance: None risk of falls: Patient Identification Verified: Yes Signs or symptoms of abuse/neglect since last No Secondary Verification Process Yes visito Completed: Hospitalized since last visit: No Patient Requires Transmission- No Implantable device outside of the clinic excluding No Based Precautions: cellular tissue based products placed in the center Patient Has Alerts: Yes since last visit: Patient Alerts: Patient on Blood Has Dressing in Place as Prescribed: Yes Thinner Has Compression in Place as Prescribed: Yes Pain Present Now: No Electronic Signature(s) Signed: 12/21/2019 5:47:02 PM By: Deon Pilling Entered By: Deon Pilling on 12/21/2019 13:41:17 -------------------------------------------------------------------------------- Compression Therapy Details Patient Name: Date of Service: [name], [name] 12/21/2019 1:45 PM Medical Record [id number] Patient Account Number: [id number] Date of Birth/Sex: Treating RN: 25-Jun-1936 (84 y.o. [name] Primary Care Yukie Bergeron: [name] Other Clinician: Referring Casimira Sutphin: Treating Zelphia Glover/Extender:Robson, Vista Lawman, Moses Manners in Treatment: 10 Compression Therapy  Performed for Wound Wound #1 Right,Distal,Posterior Lower Leg Assessment: Performed By: Clinician Levan Hurst, RN Compression Type: Three Layer Post Procedure Diagnosis Same as Pre-procedure Electronic Signature(s) Signed: 12/22/2019 5:30:42 PM By: Levan Hurst RN, BSN Entered By: Levan Hurst on 12/21/2019 14:10:54 -------------------------------------------------------------------------------- Encounter Discharge Information Details Patient Name: Date of Service: [name] 12/21/2019 1:45 PM Medical Record [id number] Patient Account Number: [id number] Date of Birth/Sex: Treating RN: May 11, 1936 (84 y.o. [name], [name] Primary Care Ahsan Esterline: [name] Other Clinician: Referring Duaa Stelzner: Treating Hudsyn Champine/Extender:Robson, Vista Lawman, Moses Manners in Treatment: 10 Encounter Discharge Information Items Discharge Condition: Stable Ambulatory Status: Walker Discharge Destination: Home Transportation: Private Auto Accompanied By: self Schedule Follow-up Appointment: Yes Clinical Summary of Care: Electronic Signature(s) Signed: 12/21/2019 5:47:02 PM By: Deon Pilling Entered By: Deon Pilling on 12/21/2019 14:39:21 -------------------------------------------------------------------------------- Lower Extremity Assessment Details Patient Name: Date of Service: [name], [name] 12/21/2019 1:45 PM Medical Record [id number] Patient Account Number: [id number] Date of Birth/Sex: Treating RN: 06-20-1936 (84 y.o. [name], [name] Primary Care Geana Walts: [name] Other Clinician: Referring Marin Milley: Treating David Rodriquez/Extender:Robson, Vista Lawman, Moses Manners in Treatment: 10 Edema Assessment Assessed: [Left: No] [Right: Yes] Edema: [Left: N] [Right: o] Calf Left: Right: Point of Measurement: 37 cm From Medial Instep cm 31 cm Ankle Left: Right: Point of Measurement: 10 cm From Medial Instep cm 19 cm Vascular Assessment Pulses: Dorsalis  Pedis Palpable: [Right:Yes] Electronic Signature(s) Signed: 12/21/2019 5:47:02 PM By: Deon Pilling Entered By: Deon Pilling on 12/21/2019 13:45:44 -------------------------------------------------------------------------------- Multi Wound Chart Details Patient Name: Date of Service: [name] 12/21/2019 1:45 PM Medical Record [id number] Patient Account Number: [id number] Date of Birth/Sex: Treating RN: 12/12/1935 (84 y.o. F) Primary Care Wilmont Olund: [name] Other Clinician: Referring Aidynn Polendo: Treating Demont Linford/Extender:Robson, Vista Lawman, Moses Manners in Treatment: 10 Vital Signs Height(in): 58 Pulse(bpm): 70 Weight(lbs): 111 Blood Pressure(mmHg): 142/77 Body Mass Index(BMI): 23 Temperature(F): 98.1 Respiratory 18 Rate(breaths/min): Photos: [1:No Photos] [N/A:N/A] Wound Location: [1:Right Lower Leg - Posterior, Distal] [N/A:N/A] Wounding Event: [1:Trauma] [N/A:N/A]  Primary Etiology: [1:Lymphedema] [N/A:N/A] Comorbid History: [1:Congestive Heart Failure, Hypertension, Received Radiation] [N/A:N/A] Date Acquired: [1:09/08/2019] [N/A:N/A] Weeks of Treatment: [1:10] [N/A:N/A] Wound Status: [1:Open] [N/A:N/A] Measurements L x W x D 0.7x0.4x0.6 [N/A:N/A] (cm) Area (cm) : [1:0.22] [N/A:N/A] Volume (cm) : [1:0.132] [N/A:N/A] % Reduction in Area: [1:95.30%] [N/A:N/A] % Reduction in Volume: [1:72.00%] [N/A:N/A] Position 1 (o'clock): [1:9] Tunneling: [1:Yes] [N/A:N/A] Classification: [1:Full Thickness Without Exposed Support Structures] [N/A:N/A] Exudate Amount: [1:Medium] [N/A:N/A] Exudate Type: [1:Serosanguineous] [N/A:N/A] Exudate Color: [1:red, brown] [N/A:N/A] Wound Margin: [1:Well defined, not attached N/A] Granulation Amount: [1:Large (67-100%)] [N/A:N/A] Granulation Quality: [1:Red, Pink] [N/A:N/A] Necrotic Amount: [1:None Present (0%)] [N/A:N/A] Exposed Structures: [1:Fat Layer (Subcutaneous N/A Tissue) Exposed: Yes Fascia: No Tendon: No  Muscle: No Joint: No Bone: No] Epithelialization: [1:Small (1-33%) Compression Therapy] [N/A:N/A N/A] Treatment Notes Electronic Signature(s) Signed: 12/21/2019 6:01:37 PM By: Linton Ham MD Entered By: Linton Ham on 12/21/2019 14:23:34 -------------------------------------------------------------------------------- [facility] Details Patient Name: Date of Service: Annette Blinks [name] 12/21/2019 1:45 PM Medical Record [id number] Patient Account Number: [id number] Date of Birth/Sex: Treating RN: Dec 11, 1935 (84 y.o. [name] Primary Care Loris Seelye: [name] Other Clinician: Referring Starr Engel: Treating Juliano Mceachin/Extender:Robson, Vista Lawman, Moses Manners in Treatment: 10 Active Inactive Abuse / Safety / Falls / Self Care Management Nursing Diagnoses: Potential for falls Potential for injury related to falls Goals: Patient will not experience any injury related to falls Date Initiated: 10/12/2019 Target Resolution Date: 01/08/2020 Goal Status: Active Patient/caregiver will verbalize understanding of skin care regimen Date Initiated: 10/12/2019 Date Inactivated: 12/07/2019 Target Resolution Date: 12/11/2019 Goal Status: Met Patient/caregiver will verbalize/demonstrate measures taken to prevent injury and/or falls Date Initiated: 10/12/2019 Date Inactivated: 11/09/2019 Target Resolution Date: 11/13/2019 Goal Status: Met Interventions: Assess Activities of Daily Living upon admission and as needed Assess fall risk on admission and as needed Assess: immobility, friction, shearing, incontinence upon admission and as needed Assess impairment of mobility on admission and as needed per policy Assess personal safety and home safety (as indicated) on admission and as needed Assess self care needs on admission and as needed Provide education on fall prevention Provide education on personal and home safety Notes: Wound/Skin Impairment Nursing  Diagnoses: Impaired tissue integrity Knowledge deficit related to ulceration/compromised skin integrity Goals: Patient/caregiver will verbalize understanding of skin care regimen Date Initiated: 10/12/2019 Target Resolution Date: 01/08/2020 Goal Status: Active Ulcer/skin breakdown will have a volume reduction of 30% by week 4 Date Initiated: 10/12/2019 Date Inactivated: 11/09/2019 Target Resolution Date: 11/13/2019 Goal Status: Met Ulcer/skin breakdown will have a volume reduction of 50% by week 8 Date Initiated: 11/09/2019 Date Inactivated: 12/07/2019 Target Resolution Date: 12/11/2019 Goal Status: Met Ulcer/skin breakdown will have a volume reduction of 80% by week 12 Date Initiated: 12/07/2019 Target Resolution Date: 01/08/2020 Goal Status: Active Interventions: Assess patient/caregiver ability to obtain necessary supplies Assess patient/caregiver ability to perform ulcer/skin care regimen upon admission and as needed Assess ulceration(s) every visit Provide education on ulcer and skin care Notes: Electronic Signature(s) Signed: 12/22/2019 5:30:42 PM By: Levan Hurst RN, BSN Entered By: Levan Hurst on 12/21/2019 19:27:24 -------------------------------------------------------------------------------- Pain Assessment Details Patient Name: Date of Service: [name] 12/21/2019 1:45 PM Medical Record [id number] Patient Account Number: [id number] Date of Birth/Sex: Treating RN: 1936-08-25 (84 y.o. [name] Primary Care Miguel Christiana: [name] Other Clinician: Referring Ceci Taliaferro: Treating Briell Paulette/Extender:Robson, Vista Lawman, Moses Manners in Treatment: 10 Active Problems Location of Pain Severity and Description of Pain Patient Has Paino No Site Locations Rate the pain. Current Pain Level: 0  Pain Management and Medication Current Pain Management: Medication: No Cold Application: No Rest: No Massage: No Activity: No T.E.N.S.: No Heat  Application: No Leg drop or elevation: No Is the Current Pain Management Adequate: Adequate How does your wound impact your activities of daily livingo Sleep: No Bathing: No Appetite: No Relationship With Others: No Bladder Continence: No Emotions: No Bowel Continence: No Work: No Toileting: No Drive: No Dressing: No Hobbies: No Electronic Signature(s) Signed: 12/21/2019 5:47:02 PM By: Deon Pilling Entered By: Deon Pilling on 12/21/2019 13:42:10 -------------------------------------------------------------------------------- Patient/Caregiver Education Details Patient Name: [name], [name] Date of Service: 2/8/2021andnbsp1:45 PM Medical Record [id number] Patient Account Number: [id number] Date of Birth/Gender: Dec 05, 1935 (84 y.o. F) Treating RN: Levan Hurst Primary Care Physician: [name] Other Clinician: Referring Physician: Treating Physician/Extender:Robson, Vista Lawman, Moses Manners in Treatment: 10 Education Assessment Education Provided To: Patient Education Topics Provided Wound/Skin Impairment: Methods: Explain/Verbal Responses: State content correctly [employer]) Signed: 12/22/2019 5:30:42 PM By: Levan Hurst RN, BSN Entered By: Levan Hurst on 12/21/2019 19:28:00 -------------------------------------------------------------------------------- Wound Assessment Details Patient Name: Date of Service: [name] 12/21/2019 1:45 PM Medical Record [id number] Patient Account Number: [id number] Date of Birth/Sex: Treating RN: September 01, 1936 (84 y.o. F) Primary Care Paislyn Domenico: [name] Other Clinician: Referring Verla Bryngelson: Treating Eyden Dobie/Extender:Robson, Vista Lawman, Moses Manners in Treatment: 10 Wound Status Wound Number: 1 Primary Lymphedema Etiology: Wound Location: Right Lower Leg - Posterior, Distal Wound Open Wounding Event: Trauma Status: Date Acquired: 09/08/2019 Comorbid Congestive Heart Failure,  Hypertension, Weeks Of Treatment: 10 History: Received Radiation Clustered Wound: No Photos Wound Measurements Length: (cm) 0.7 Width: (cm) 0.4 Depth: (cm) 0.6 Area: (cm) 0.22 Volume: (cm) 0.132 % Reduction in Area: 95.3% % Reduction in Volume: 72% Epithelialization: Small (1-33%) Tunneling: Yes Position (o'clock): 9 Undermining: No Wound Description Classification: Full Thickness Without Exposed Support Foul Odo Structures Slough/F Wound Well defined, not attached Margin: Exudate Medium Amount: Exudate Serosanguineous Type: Exudate red, brown Color: Wound Bed Granulation Amount: Large (67-100%) Granulation Quality: Red, Pink Fascia E Necrotic Amount: None Present (0%) Fat Laye Tendon E Muscle E Joint Ex Bone Exp r After Cleansing: No ibrino No Exposed Structure xposed: No r (Subcutaneous Tissue) Exposed: Yes xposed: No xposed: No posed: No osed: No Treatment Notes Wound #1 (Right, Distal, Posterior Lower Leg) 1. Cleanse With Wound Cleanser Soap and water 2. Periwound Care Moisturizing lotion 3. Primary Dressing Applied Calcium Alginate Ag 4. Secondary Dressing ABD Pad Dry Gauze 6. Support Layer Applied 3 layer compression wrap Notes netting. Electronic Signature(s) Signed: 12/23/2019 4:31:42 PM By: Mikeal Hawthorne EMT/HBOT Previous Signature: 12/21/2019 5:47:02 PM Version By: Deon Pilling Entered By: Mikeal Hawthorne on 12/23/2019 14:39:13 -------------------------------------------------------------------------------- Vitals Details Patient Name: Date of Service: [name], [name] 12/21/2019 1:45 PM Medical Record [id number] Patient Account Number: [id number] Date of Birth/Sex: Treating RN: 25-Jul-1936 (85 y.o. [name], Meta.Reding Primary Care Tomekia Helton: Other Clinician: [name] Referring Ilah Boule: Treating Fransisca Shawn/Extender:Robson, Vista Lawman, Moses Manners in Treatment: 10 Vital Signs Time Taken: 13:40 Temperature (F):  98.1 Height (in): 58 Pulse (bpm): 70 Weight (lbs): 111 Respiratory Rate (breaths/min): 18 Body Mass Index (BMI): 23.2 Blood Pressure (mmHg): 142/77 Reference Range: 80 - 120 mg / dl Electronic Signature(s) Signed: 12/21/2019 5:47:02 PM By: Deon Pilling Entered By: Deon Pilling on 12/21/2019 13:41:58

## 2019-12-24 DIAGNOSIS — N8111 Cystocele, midline: Secondary | ICD-10-CM | POA: Diagnosis not present

## 2019-12-24 DIAGNOSIS — N814 Uterovaginal prolapse, unspecified: Secondary | ICD-10-CM | POA: Diagnosis not present

## 2019-12-27 LAB — AEROBIC/ANAEROBIC CULTURE W GRAM STAIN (SURGICAL/DEEP WOUND): Culture: NORMAL

## 2019-12-28 ENCOUNTER — Other Ambulatory Visit: Payer: Self-pay

## 2019-12-28 ENCOUNTER — Encounter (HOSPITAL_BASED_OUTPATIENT_CLINIC_OR_DEPARTMENT_OTHER): Payer: Medicare Other | Admitting: Internal Medicine

## 2019-12-28 DIAGNOSIS — I110 Hypertensive heart disease with heart failure: Secondary | ICD-10-CM | POA: Diagnosis not present

## 2019-12-28 DIAGNOSIS — I890 Lymphedema, not elsewhere classified: Secondary | ICD-10-CM | POA: Diagnosis not present

## 2019-12-28 DIAGNOSIS — L97212 Non-pressure chronic ulcer of right calf with fat layer exposed: Secondary | ICD-10-CM | POA: Diagnosis not present

## 2019-12-28 DIAGNOSIS — S81801A Unspecified open wound, right lower leg, initial encounter: Secondary | ICD-10-CM | POA: Diagnosis not present

## 2019-12-28 DIAGNOSIS — I4891 Unspecified atrial fibrillation: Secondary | ICD-10-CM | POA: Diagnosis not present

## 2019-12-28 DIAGNOSIS — I351 Nonrheumatic aortic (valve) insufficiency: Secondary | ICD-10-CM | POA: Diagnosis not present

## 2019-12-28 DIAGNOSIS — L03115 Cellulitis of right lower limb: Secondary | ICD-10-CM | POA: Diagnosis not present
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
## 2019-12-28 NOTE — Progress Notes (Addendum)
[name], [name] ([id number]) Visit Report for 12/28/2019 Arrival Information Details Patient Name: Date of Service: [name], [name] 12/28/2019 1:15 PM Medical Record [id number] Patient Account Number: [id number] Date of Birth/Sex: Treating RN: 11/02/1936 (84 y.o. [name], [name] Primary Care [name]: [name] Other Clinician: Referring [name]: Treating [name]/Extender:[name], [name] in Treatment: 11 Visit Information History Since Last Visit Added or deleted any medications: No Patient Arrived: [name] Any new allergies or adverse reactions: No Arrival Time: 13:35 Had a fall or experienced change in No Accompanied By: self activities of daily living that may affect Transfer Assistance: None risk of falls: Patient Identification Verified: Yes Signs or symptoms of abuse/neglect since last No Secondary Verification Process Yes visito Completed: Hospitalized since last visit: No Patient Requires Transmission- No Implantable device outside of the clinic excluding No Based Precautions: cellular tissue based products placed in the center Patient Has Alerts: Yes since last visit: Patient Alerts: Patient on Blood Has Dressing in Place as Prescribed: Yes Thinner Has Compression in Place as Prescribed: Yes Pain Present Now: No Electronic Signature(s) Signed: 12/28/2019 6:19:44 PM By: [name] Entered By: [name] on 12/28/2019 13:39:43 -------------------------------------------------------------------------------- Compression Therapy Details Patient Name: Date of Service: [name], [name] 12/28/2019 1:15 PM Medical Record [id number] Patient Account Number: [id number] Date of Birth/Sex: Treating RN: 10-28-1936 (84 y.o. [name] Primary Care Khadir Roam: [name] Other Clinician: Referring Kennth Vanbenschoten: Treating [name] Sappington/Extender:[name], [name] in Treatment: 11 Compression Therapy  Performed for Wound Wound #1 Right,Distal,Posterior Lower Leg Assessment: Performed By: Clinician Annette Hurst, RN Compression Type: Three Layer Post Procedure Diagnosis Same as Pre-procedure Electronic Signature(s) Signed: 12/28/2019 6:14:31 PM By: Annette Hurst RN, [name] Entered By: [name] on 12/28/2019 14:24:41 -------------------------------------------------------------------------------- Encounter Discharge Information Details Patient Name: Date of Service: [name]. 12/28/2019 1:15 PM Medical Record [id number] Patient Account Number: [id number] Date of Birth/Sex: Treating RN: 07/10/1936 (84 y.o. [name] Primary Care Cherisse Carrell: [name] Other Clinician: Referring Zya Finkle: Treating Kaziah Krizek/Extender:[name], [name] in Treatment: 11 Encounter Discharge Information Items Discharge Condition: Stable Ambulatory Status: Walker Discharge Destination: Home Transportation: Private Auto Accompanied By: self Schedule Follow-up Appointment: Yes Clinical Summary of Care: Electronic Signature(s) Signed: 12/28/2019 6:19:44 PM By: [name] Entered By: [name] on 12/28/2019 14:49:24 -------------------------------------------------------------------------------- Lower Extremity Assessment Details Patient Name: Date of Service: [name] 12/28/2019 1:15 PM Medical Record [id number] Patient Account Number: [id number] Date of Birth/Sex: Treating RN: 1936/10/08 (84 y.o. [name], [name] Primary Care Jaden Abreu: [name] Other Clinician: Referring Kea Callan: Treating Deundra Furber/Extender:[name], [name] in Treatment: 11 Edema Assessment Assessed: [Left: No] [Right: Yes] Edema: [Left: N] [Right: o] Calf Left: Right: Point of Measurement: 37 cm From Medial Instep cm 31 cm Ankle Left: Right: Point of Measurement: 10 cm From Medial Instep cm 20 cm Vascular Assessment Pulses: Dorsalis  Pedis Palpable: [Right:Yes] Electronic Signature(s) Signed: 12/28/2019 6:19:44 PM By: [name] Entered By: [name] on 12/28/2019 13:42:48 -------------------------------------------------------------------------------- Multi Wound Chart Details Patient Name: Date of Service: [name]. 12/28/2019 1:15 PM Medical Record [id number] Patient Account Number: [id number] Date of Birth/Sex: Treating RN: [name] 03, 1937 (84 y.o. [name] Primary Care Sayward Horvath: [name] Other Clinician: Referring [name]: Treating [name]/Extender:[name], [name] in Treatment: 11 Vital Signs Height(in): 58 Pulse(bpm): 95 Weight(lbs): 111 Blood Pressure(mmHg): 140/74 Body Mass Index(BMI): 23 Temperature(F): 98 Respiratory 18 Rate(breaths/min): Photos: [1:No Photos] [N/A:N/A] Wound Location: [1:Right, Distal, Posterior Lower Leg] [N/A:N/A] Wounding Event: [1:Trauma] [  N/A:N/A] Primary Etiology: [1:Lymphedema] [N/A:N/A] Comorbid History: [1:Congestive Heart Failure, Hypertension, Received Radiation] [N/A:N/A] Date Acquired: [1:09/08/2019] [N/A:N/A] Weeks of Treatment: [1:11] [N/A:N/A] Wound Status: [1:Open] [N/A:N/A] Measurements L x W x D 0.7x0.8x0.4 [N/A:N/A] (cm) Area (cm) : [1:0.44] [N/A:N/A] Volume (cm) : [1:0.176] [N/A:N/A] % Reduction in Area: [1:90.70%] [N/A:N/A] % Reduction in Volume: [1:62.60%] [N/A:N/A] Position 1 (o'clock): [1:9] Maximum Distance 1 [1:1.4] (cm): Tunneling: [1:Yes] [N/A:N/A] Classification: [1:Full Thickness Without Exposed Support Structures] [N/A:N/A] Exudate Amount: [1:Medium] [N/A:N/A] Exudate Type: [1:Serosanguineous] [N/A:N/A] Exudate Color: [1:red, brown] [N/A:N/A] Wound Margin: [1:Well defined, not attached N/A] Granulation Amount: [1:Large (67-100%)] [N/A:N/A] Granulation Quality: [1:Red, Pink] [N/A:N/A] Necrotic Amount: [1:Small (1-33%)] [N/A:N/A] Exposed Structures: [1:Fat Layer (Subcutaneous N/A  Tissue) Exposed: Yes Fascia: No Tendon: No Muscle: No Joint: No Bone: No] Epithelialization: [1:Small (1-33%) Compression Therapy] [N/A:N/A N/A] Treatment Notes Electronic Signature(s) Signed: 12/28/2019 6:14:31 PM By: Annette Hurst RN, [name] Signed: 12/28/2019 6:16:05 PM By: Linton Ham MD Entered By: Linton Ham on 12/28/2019 14:27:29 -------------------------------------------------------------------------------- Multi-Disciplinary Care Plan Details Patient Name: Date of Service: Annette Blinks Annette C. 12/28/2019 1:15 PM Medical Record [id number] Patient Account Number: [id number] Date of Birth/Sex: Treating RN: 06/27/1936 (84 y.o. [name] Primary Care Lewellyn Fultz: [name] Other Clinician: Referring Tyna Huertas: Treating Tarena Gockley/Extender:[name], [name] in Treatment: 11 Active Inactive Abuse / Safety / Falls / Self Care Management Nursing Diagnoses: Potential for falls Potential for injury related to falls Goals: Patient will not experience any injury related to falls Date Initiated: 10/12/2019 Target Resolution Date: 01/08/2020 Goal Status: Active Patient/caregiver will verbalize understanding of skin care regimen Date Initiated: 10/12/2019 Date Inactivated: 12/07/2019 Target Resolution Date: 12/11/2019 Goal Status: Met Patient/caregiver will verbalize/demonstrate measures taken to prevent injury and/or falls Date Initiated: 10/12/2019 Date Inactivated: 11/09/2019 Target Resolution Date: 11/13/2019 Goal Status: Met Interventions: Assess Activities of Daily Living upon admission and as needed Assess fall risk on admission and as needed Assess: immobility, friction, shearing, incontinence upon admission and as needed Assess impairment of mobility on admission and as needed per policy Assess personal safety and home safety (as indicated) on admission and as needed Assess self care needs on admission and as needed Provide education on fall  prevention Provide education on personal and home safety Notes: Wound/Skin Impairment Nursing Diagnoses: Impaired tissue integrity Knowledge deficit related to ulceration/compromised skin integrity Goals: Patient/caregiver will verbalize understanding of skin care regimen Date Initiated: 10/12/2019 Target Resolution Date: 01/08/2020 Goal Status: Active Ulcer/skin breakdown will have a volume reduction of 30% by week 4 Date Initiated: 10/12/2019 Date Inactivated: 11/09/2019 Target Resolution Date: 11/13/2019 Goal Status: Met Ulcer/skin breakdown will have a volume reduction of 50% by week 8 Date Initiated: 11/09/2019 Date Inactivated: 12/07/2019 Target Resolution Date: 12/11/2019 Goal Status: Met Ulcer/skin breakdown will have a volume reduction of 80% by week 12 Date Initiated: 12/07/2019 Target Resolution Date: 01/08/2020 Goal Status: Active Interventions: Assess patient/caregiver ability to obtain necessary supplies Assess patient/caregiver ability to perform ulcer/skin care regimen upon admission and as needed Assess ulceration(s) every visit Provide education on ulcer and skin care Notes: Electronic Signature(s) Signed: 12/28/2019 6:14:31 PM By: Annette Hurst RN, [name] Entered By: [name] on 12/28/2019 17:11:22 -------------------------------------------------------------------------------- Pain Assessment Details Patient Name: Date of Service: [name] 12/28/2019 1:15 PM Medical Record [id number] Patient Account Number: [id number] Date of Birth/Sex: Treating RN: Sep 30, 1936 (84 y.o. [name] Primary Care Loucille Takach: [name] Other Clinician: Referring Khiya Friese: Treating Chrystian Cupples/Extender:[name], [name] in Treatment: 11 Active Problems Location of Pain Severity and Description of  Pain Patient Has Paino No Site Locations Pain Management and Medication Current Pain Management: Medication: No Cold Application: No Rest:  No Massage: No Activity: No T.E.N.S.: No Heat Application: No Leg drop or elevation: No Is the Current Pain Management Adequate: Adequate How does your wound impact your activities of daily livingo Sleep: No Bathing: No Appetite: No Relationship With Others: No Bladder Continence: No Emotions: No Bowel Continence: No Work: No Toileting: No Drive: No Dressing: No Hobbies: No Electronic Signature(s) Signed: 12/28/2019 6:19:44 PM By: [name] Entered By: [name] on 12/28/2019 13:40:57 -------------------------------------------------------------------------------- Patient/Caregiver Education Details Patient Name: Date of Service: [name] 2/15/2021andnbsp1:15 PM Medical Record [id number] Patient Account Number: [id number] Date of Birth/Gender: Treating RN: Jun 22, 1936 (84 y.o. [name] Primary Care Physician: [name] Other Clinician: Referring Physician: Treating Physician/Extender:[name], [name] in Treatment: 11 Education Assessment Education Provided To: Patient Education Topics Provided Wound/Skin Impairment: Methods: Explain/Verbal Responses: State content correctly [employer]) Signed: 12/28/2019 6:14:31 PM By: Annette Hurst RN, [name] Entered By: [name] on 12/28/2019 17:11:31 -------------------------------------------------------------------------------- Wound Assessment Details Patient Name: Date of Service: [name] 12/28/2019 1:15 PM Medical Record [id number] Patient Account Number: [id number] Date of Birth/Sex: Treating RN: 1936-11-02 (84 y.o. [name] Primary Care [place] N[place]: [name] Other Clinician: Referring Garrett Mitchum: Treating Merril Nagy/Extender:[name], [name] in Treatment: 11 Wound Status Wound Number: 1 Primary Lymphedema Etiology: Wound Location: Right Lower Leg - Posterior, Distal Wound Open Wounding Event:  Trauma Status: Date Acquired: 09/08/2019 Comorbid Congestive Heart Failure, Hypertension, Weeks Of Treatment: 11 History: Received Radiation Clustered Wound: No Photos Wound Measurements Length: (cm) 0.7 Width: (cm) 0.8 Depth: (cm) 0.4 Area: (cm) 0.44 Volume: (cm) 0.176 % Reduction in Area: 90.7% % Reduction in Volume: 62.6% Epithelialization: Small (1-33%) Tunneling: Yes Position (o'clock): 9 Maximum Distance: (cm) 1.4 Undermining: No Wound Description Classification: Full Thickness Without Exposed Support Foul Odo Structures Slough/F Wound Well defined, not attached Margin: Exudate Medium Amount: Exudate Serosanguineous Type: Exudate red, brown Color: Wound Bed Granulation Amount: Large (67-100%) Granulation Quality: Red, Pink Fascia E Necrotic Amount: Small (1-33%) Fat Laye Necrotic Quality: Adherent Slough Tendon E Muscle E Joint Ex Bone Exp r After Cleansing: No ibrino Yes Exposed Structure xposed: No r (Subcutaneous Tissue) Exposed: Yes xposed: No xposed: No posed: No osed: No Electronic Signature(s) Signed: 12/29/2019 4:25:10 PM By: Mikeal Hawthorne EMT/HBOT Signed: 01/07/2020 8:56:47 AM By: Annette Hurst RN, [name] Previous Signature: 12/28/2019 6:14:31 PM Version By: Annette Hurst RN, [name] Entered By: Mikeal Hawthorne on 12/29/2019 14:20:26 -------------------------------------------------------------------------------- Vitals Details Patient Name: Date of Service: Annette Blinks Annette C. 12/28/2019 1:15 PM Medical Record [id number] Patient Account Number: [id number] Date of Birth/Sex: Treating RN: 07-24-1936 (84 y.o. [name], [name] Primary Care Dayne Chait: [name] Other Clinician: Referring Melodye Swor: Treating Ronnita Paz/Extender:[name], [name] in Treatment: 11 Vital Signs Time Taken: 13:39 Temperature (F): 98 Height (in): 58 Pulse (bpm): 66 Weight (lbs): 111 Respiratory Rate (breaths/min): 18 Body Mass Index  (BMI): 23.2 Blood Pressure (mmHg): 140/74 Reference Range: 80 - 120 mg / dl Electronic Signature(s) Signed: 12/28/2019 6:19:44 PM By: [name] Entered By: [name] on 12/28/2019 13:40:50

## 2020-01-04 ENCOUNTER — Other Ambulatory Visit: Payer: Self-pay

## 2020-01-04 ENCOUNTER — Encounter (HOSPITAL_BASED_OUTPATIENT_CLINIC_OR_DEPARTMENT_OTHER): Payer: Medicare Other | Admitting: Internal Medicine

## 2020-01-04 DIAGNOSIS — S81801A Unspecified open wound, right lower leg, initial encounter: Secondary | ICD-10-CM | POA: Diagnosis not present

## 2020-01-04 DIAGNOSIS — I110 Hypertensive heart disease with heart failure: Secondary | ICD-10-CM | POA: Diagnosis not present

## 2020-01-04 DIAGNOSIS — I890 Lymphedema, not elsewhere classified: Secondary | ICD-10-CM | POA: Diagnosis not present

## 2020-01-04 DIAGNOSIS — I351 Nonrheumatic aortic (valve) insufficiency: Secondary | ICD-10-CM | POA: Diagnosis not present

## 2020-01-04 DIAGNOSIS — I4891 Unspecified atrial fibrillation: Secondary | ICD-10-CM | POA: Diagnosis not present

## 2020-01-04 DIAGNOSIS — L97212 Non-pressure chronic ulcer of right calf with fat layer exposed: Secondary | ICD-10-CM | POA: Diagnosis not present

## 2020-01-04 DIAGNOSIS — L03115 Cellulitis of right lower limb: Secondary | ICD-10-CM | POA: Diagnosis not present

## 2020-01-05 ENCOUNTER — Other Ambulatory Visit: Payer: Self-pay | Admitting: Cardiology
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
## 2020-01-08 DIAGNOSIS — R202 Paresthesia of skin: Secondary | ICD-10-CM | POA: Diagnosis not present

## 2020-01-11 ENCOUNTER — Encounter (HOSPITAL_BASED_OUTPATIENT_CLINIC_OR_DEPARTMENT_OTHER): Payer: Medicare Other | Attending: Internal Medicine | Admitting: Internal Medicine

## 2020-01-11 ENCOUNTER — Other Ambulatory Visit: Payer: Self-pay

## 2020-01-11 DIAGNOSIS — S8011XD Contusion of right lower leg, subsequent encounter: Secondary | ICD-10-CM | POA: Insufficient documentation

## 2020-01-11 DIAGNOSIS — S81801A Unspecified open wound, right lower leg, initial encounter: Secondary | ICD-10-CM | POA: Diagnosis not present

## 2020-01-11 DIAGNOSIS — I4891 Unspecified atrial fibrillation: Secondary | ICD-10-CM | POA: Diagnosis not present

## 2020-01-11 DIAGNOSIS — I110 Hypertensive heart disease with heart failure: Secondary | ICD-10-CM | POA: Insufficient documentation

## 2020-01-11 DIAGNOSIS — W208XXD Other cause of strike by thrown, projected or falling object, subsequent encounter: Secondary | ICD-10-CM | POA: Diagnosis not present

## 2020-01-11 DIAGNOSIS — I872 Venous insufficiency (chronic) (peripheral): Secondary | ICD-10-CM | POA: Diagnosis not present

## 2020-01-11 DIAGNOSIS — Z853 Personal history of malignant neoplasm of breast: Secondary | ICD-10-CM | POA: Insufficient documentation

## 2020-01-11 DIAGNOSIS — Z7901 Long term (current) use of anticoagulants: Secondary | ICD-10-CM | POA: Insufficient documentation

## 2020-01-11 DIAGNOSIS — L03115 Cellulitis of right lower limb: Secondary | ICD-10-CM | POA: Insufficient documentation

## 2020-01-11 DIAGNOSIS — L97212 Non-pressure chronic ulcer of right calf with fat layer exposed: Secondary | ICD-10-CM | POA: Insufficient documentation

## 2020-01-11 DIAGNOSIS — I890 Lymphedema, not elsewhere classified: Secondary | ICD-10-CM | POA: Insufficient documentation

## 2020-01-11 DIAGNOSIS — I5032 Chronic diastolic (congestive) heart failure: Secondary | ICD-10-CM | POA: Insufficient documentation
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
## 2020-01-12 ENCOUNTER — Other Ambulatory Visit: Payer: Self-pay | Admitting: Physician Assistant

## 2020-01-12 NOTE — Progress Notes (Addendum)
[name] ([id number]) Visit Report for 01/11/2020 Arrival Information Details Patient Name: Date of Service: [name] 01/11/2020 1:30 PM Medical Record [id number] Patient Account Number: [id number] Date of Birth/Sex: Treating [name]: [birth year] (84 y.o. [name] Primary Care Bryker Fletchall: [name] Other Clinician: Referring [name]: Treating [name]:[name], [name] in Treatment: 33 Visit Information History Since Last Visit All ordered tests and consults were completed: No Patient Arrived: [name] Added or deleted any medications: No Arrival Time: 13:51 Any new allergies or adverse reactions: No Accompanied By: self Had a fall or experienced change in No Transfer Assistance: None activities of daily living that may affect Patient Identification Verified: Yes risk of falls: Secondary Verification Process Yes Signs or symptoms of abuse/neglect since last No Completed: visito Patient Requires Transmission- No Hospitalized since last visit: No Based Precautions: Implantable device outside of the clinic excluding No Patient Has Alerts: Yes cellular tissue based products placed in the center Patient Alerts: Patient on Blood since last visit: Thinner Has Dressing in Place as Prescribed: Yes Has Compression in Place as Prescribed: Yes Pain Present Now: No Electronic Signature(s) Signed: 01/12/2020 9:14:02 AM By: [name] Entered By: Annette Coria on 01/11/2020 13:52:28 -------------------------------------------------------------------------------- Compression Therapy Details Patient Name: Date of Service: Annette Mallet C. 01/11/2020 1:30 PM Medical Record [id number] Patient Account Number: [id number] Date of Birth/Sex: Treating [name]: 14-May-1936 (84 y.o. [name] Primary Care Avia Merkley: [name] Other Clinician: Referring Kennedy Bohanon: Treating Nakaya Mishkin/Extender:[name],  [name] in Treatment: 13 Compression Therapy Performed for Wound Wound #1 Right,Distal,Posterior Lower Leg Assessment: Performed By: Clinician Annette Hurst, [name] Compression Type: Three Layer Post Procedure Diagnosis Same as Pre-procedure Electronic Signature(s) Signed: 01/11/2020 5:57:14 PM By: Annette Hurst [name], [name] Entered By: Annette Hurst on 01/11/2020 14:36:17 -------------------------------------------------------------------------------- Encounter Discharge Information Details Patient Name: Date of Service: Annette Blinks [name] 01/11/2020 1:30 PM Medical Record [id number] Patient Account Number: [id number] Date of Birth/Sex: Treating [name]: [birth year] (84 y.o. [name] Primary Care Slayter Moorhouse: [name] Other Clinician: Referring [name]: Treating Yaakov Saindon/Extender:[name], [name] in Treatment: 13 Encounter Discharge Information Items Post Procedure Vitals Discharge Condition: Stable Temperature (F): 98.2 Ambulatory Status: Walker Pulse (bpm): 67 Discharge Destination: Home Respiratory Rate (breaths/min): 18 Transportation: Private Auto Blood Pressure (mmHg): 127/86 Accompanied By: self Schedule Follow-up Appointment: Yes Clinical Summary of Care: Patient Declined Electronic Signature(s) Signed: 01/11/2020 5:21:05 PM By: [name] Entered By: [name] on 01/11/2020 14:52:33 -------------------------------------------------------------------------------- Lower Extremity Assessment Details Patient Name: Date of Service: [name], [name] 01/11/2020 1:30 PM Medical Record [id number] Patient Account Number: [id number] Date of Birth/Sex: Treating [name]: [birth year] (84 y.o. [name] Primary Care [name]: [name] Other Clinician: Referring Latiana Tomei: Treating [name]/Extender:[name], [name] in Treatment: 13 Edema Assessment Assessed: [Left: No] [Right: No] Edema: [Left: N]  [Right: o] Calf Left: Right: Point of Measurement: 37 cm From Medial Instep cm 33 cm Ankle Left: Right: Point of Measurement: 10 cm From Medial Instep cm 20 cm Electronic Signature(s) Signed: 01/12/2020 9:14:02 AM By: [name] Entered By: Annette Coria on 01/11/2020 13:54:48 -------------------------------------------------------------------------------- Multi Wound Chart Details Patient Name: Date of Service: Annette Blinks [name] 01/11/2020 1:30 PM Medical Record [id number] Patient Account Number: [id number] Date of Birth/Sex: Treating [name]: [birth year] (84 y.o. [name] Primary Care Alif Petrak: [name] Other Clinician: Referring [name]: Treating [name]/Extender:[name], [name] in Treatment: 13 Vital Signs Height(in): 58 Pulse(bpm): 86 Weight(lbs): 111 Blood Pressure(mmHg): 127/86 Body Mass  Index(BMI): 23 Temperature(F): 98.2 Respiratory 18 Rate(breaths/min): Photos: [1:No Photos] [N/A:N/A] Wound Location: [1:Right, Distal, Posterior Lower Leg] [N/A:N/A] Wounding Event: [1:Trauma] [N/A:N/A] Primary Etiology: [1:Lymphedema] [N/A:N/A] Comorbid History: [1:Congestive Heart Failure, Hypertension, Received Radiation] [N/A:N/A] Date Acquired: [1:09/08/2019] [N/A:N/A] Weeks of Treatment: [1:13] [N/A:N/A] Wound Status: [1:Open] [N/A:N/A] Measurements L x W x D 1.2x1x1.1 [N/A:N/A] (cm) Area (cm) : [1:0.942] [N/A:N/A] Volume (cm) : [1:1.037] [N/A:N/A] % Reduction in Area: [1:80.00%] [N/A:N/A] % Reduction in Volume: -120.20% [N/A:N/A] Starting Position 1 12 (o'clock): Ending Position 1 [1:12] (o'clock): Maximum Distance 1 [1:1.2] (cm): Undermining: [1:Yes] [N/A:N/A] Classification: [1:Full Thickness Without Exposed Support Structures] [N/A:N/A] Exudate Amount: [1:Medium] [N/A:N/A] Exudate Type: [1:Serosanguineous] [N/A:N/A] Exudate Color: [1:red, brown] [N/A:N/A] Wound Margin: [1:Well defined, not attached N/A] Granulation  Amount: [1:Large (67-100%)] [N/A:N/A] Granulation Quality: [1:Red] [N/A:N/A] Necrotic Amount: [1:None Present (0%)] [N/A:N/A] Exposed Structures: [1:Fat Layer (Subcutaneous N/A Tissue) Exposed: Yes Fascia: No Tendon: No Muscle: No Joint: No Bone: No] Epithelialization: [1:Small (1-33%)] [N/A:N/A] Debridement: [1:Debridement - Excisional N/A] Pre-procedure [1:14:28] [N/A:N/A] Verification/Time Out Taken: Pain Control: [1:Lidocaine Injectable] [N/A:N/A] Tissue Debrided: [1:Subcutaneous] [N/A:N/A] Level: [1:Skin/Subcutaneous Tissue] [N/A:N/A] Debridement Area (sq cm):1 [N/A:N/A] Instrument: [1:Blade, Forceps] [N/A:N/A] Bleeding: [1:Moderate] [N/A:N/A] Hemostasis Achieved: [1:Pressure] [N/A:N/A] Procedural Pain: [1:0] [N/A:N/A] Post Procedural Pain: [1:0] [N/A:N/A] Debridement Treatment Procedure was tolerated [N/A:N/A] Response: [1:well] Post Debridement [1:1.2x1x1.1] [N/A:N/A] Measurements L x W x D (cm) Post Debridement [1:1.037] [N/A:N/A] Volume: (cm) Procedures Performed: Compression Therapy [1:Debridement] [N/A:N/A] Treatment Notes Electronic Signature(s) Signed: 01/11/2020 5:45:55 PM By: Linton Ham MD Signed: 01/11/2020 5:57:14 PM By: Annette Hurst [name], [name] Entered By: Linton Ham on 01/11/2020 14:47:27 -------------------------------------------------------------------------------- Multi-Disciplinary Care Plan Details Patient Name: Date of Service: Annette Blinks [name] 01/11/2020 1:30 PM Medical Record [id number] Patient Account Number: [id number] Date of Birth/Sex: Treating [name]: 1936-10-20 (84 y.o. [name] Primary Care Shirleen Mcfaul: [name] Other Clinician: Referring Raymond Bhardwaj: Treating Jlee Harkless/Extender:[name], [name] in Treatment: 13 Active Inactive Wound/Skin Impairment Nursing Diagnoses: Impaired tissue integrity Knowledge deficit related to ulceration/compromised skin integrity Goals: Patient/caregiver will verbalize  understanding of skin care regimen Date Initiated: 10/12/2019 Target Resolution Date: 02/05/2020 Goal Status: Active Ulcer/skin breakdown will have a volume reduction of 30% by week 4 Date Initiated: 10/12/2019 Date Inactivated: 11/09/2019 Target Resolution Date: 11/13/2019 Goal Status: Met Ulcer/skin breakdown will have a volume reduction of 50% by week 8 Date Initiated: 11/09/2019 Date Inactivated: 12/07/2019 Target Resolution Date: 12/11/2019 Goal Status: Met Ulcer/skin breakdown will have a volume reduction of 80% by week 12 Date Initiated: 12/07/2019 Date Inactivated: 01/04/2020 Target Resolution Date: 01/08/2020 Goal Status: Met Interventions: Assess patient/caregiver ability to obtain necessary supplies Assess patient/caregiver ability to perform ulcer/skin care regimen upon admission and as needed Assess ulceration(s) every visit Provide education on ulcer and skin care Notes: Electronic Signature(s) Signed: 01/11/2020 5:57:14 PM By: Annette Hurst [name], [name] Entered By: Annette Hurst on 01/11/2020 17:38:03 -------------------------------------------------------------------------------- Pain Assessment Details Patient Name: Date of Service: Annette Blinks [name] 01/11/2020 1:30 PM Medical Record [id number] Patient Account Number: [id number] Date of Birth/Sex: Treating [name]: [birth year] (84 y.o. [name] Primary Care Shonteria Abeln: [name] Other Clinician: Referring Shonteria Abeln: Treating Huxley Shurley/Extender:[name], [name] in Treatment: 13 Active Problems Location of Pain Severity and Description of Pain Patient Has Paino No Site Locations Pain Management and Medication Current Pain Management: Electronic Signature(s) Signed: 01/12/2020 9:14:02 AM By: [name] Entered By: Annette Coria on 01/11/2020 13:53:05 -------------------------------------------------------------------------------- Patient/Caregiver Education Details Patient Name: Date of  Service: [name], [name] 3/1/2021andnbsp1:30 PM Medical Record [phone] Patient  Account Number: [id number] Date of Birth/Gender: Treating [name]: 1936-02-27 (84 y.o. [name] Primary Care Physician: [name] Other Clinician: Referring Physician: Treating Physician/Extender:[name], [name] in Treatment: 13 Education Assessment Education Provided To: Patient Education Topics Provided Wound/Skin Impairment: Methods: Explain/Verbal Responses: State content correctly [employer]) Signed: 01/11/2020 5:57:14 PM By: Annette Hurst [name], [name] Entered By: Annette Hurst on 01/11/2020 17:38:14 -------------------------------------------------------------------------------- Wound Assessment Details Patient Name: Date of Service: Annette Heads. 01/11/2020 1:30 PM Medical Record [id number] Patient Account Number: [id number] Date of Birth/Sex: Treating [name]: Mar 12, 1936 (84 y.o. [name] Primary Care Chantrice Hagg: [name] Other Clinician: Referring Petar Mucci: Treating Hadley Detloff/Extender:[name], [name] in Treatment: 13 Wound Status Wound Number: 1 Primary Lymphedema Etiology: Wound Location: Right Lower Leg - Posterior, Distal Wound Open Wounding Event: Trauma Status: Date Acquired: 09/08/2019 Comorbid Congestive Heart Failure, Hypertension, Weeks Of Treatment: 13 History: Received Radiation Clustered Wound: No Photos Wound Measurements Length: (cm) 1.2 Width: (cm) 1 Depth: (cm) 1.1 Area: (cm) 0.942 Volume: (cm) 1.037 % Reduction in Area: 80% % Reduction in Volume: -120.2% Epithelialization: Small (1-33%) Tunneling: No Undermining: Yes Starting Position (o'clock): 12 Ending Position (o'clock): 12 Maximum Distance: (cm) 1.2 Wound Description Classification: Full Thickness Without Exposed Support [place] Wound Well defined, not  attached Margin: Exudate Medium Amount: Exudate Serosanguineous Type: Exudate red, brown Color: Wound Bed Granulation Amount: Large (67-100%) Granulation Quality: Red Fascia Necrotic Amount: None Present (0%) Fat Layer Tendon Exp Muscle Exp Joint Expo Bone Expos dor After Cleansing: No /Fibrino No Exposed Structure Exposed: No (Subcutaneous Tissue) Exposed: Yes osed: No osed: No sed: No ed: No Treatment Notes Wound #1 (Right, Distal, Posterior Lower Leg) 1. Cleanse With Wound Cleanser Soap and water 2. Periwound Care Moisturizing lotion TCA Cream 3. Primary Dressing Applied Collegen AG 4. Secondary Dressing ABD Pad Dry Gauze 6. Support Layer Applied 3 layer compression wrap Notes netting. Electronic Signature(s) Signed: 01/14/2020 3:59:23 PM By: Mikeal Hawthorne EMT/HBOT Signed: 01/14/2020 5:39:26 PM By: Annette Hurst [name], [name] Previous Signature: 01/11/2020 5:57:14 PM Version By: Annette Hurst [name], [name] Entered By: Mikeal Hawthorne on 01/14/2020 13:01:58 -------------------------------------------------------------------------------- [place] Details Patient Name: Date of Service: [name], [name] 01/11/2020 1:30 PM Medical Record [id number] Patient Account Number: [id number] Date of Birth/Sex: Treating [name]: 01-Jul-1936 (84 y.o. [name] Primary Care Ocie Tino: [name] Other Clinician: Referring Latalia Etzler: Treating Gemayel Mascio/Extender:[name], [name] in Treatment: 13 Vital Signs Time Taken: 13:52 Temperature (F): 98.2 Height (in): 58 Pulse (bpm): 67 Weight (lbs): 111 Respiratory Rate (breaths/min): 18 Body Mass Index (BMI): 23.2 Blood Pressure (mmHg): 127/86 Reference Range: 80 - 120 mg / dl Electronic Signature(s) Signed: 01/12/2020 9:14:02 AM By: [name] Entered By: Annette Coria on 01/11/2020 13:52:53

## 2020-01-18 ENCOUNTER — Encounter (HOSPITAL_BASED_OUTPATIENT_CLINIC_OR_DEPARTMENT_OTHER): Payer: Medicare Other | Admitting: Internal Medicine

## 2020-01-18 ENCOUNTER — Other Ambulatory Visit: Payer: Self-pay

## 2020-01-18 DIAGNOSIS — I110 Hypertensive heart disease with heart failure: Secondary | ICD-10-CM | POA: Diagnosis not present

## 2020-01-18 DIAGNOSIS — L97212 Non-pressure chronic ulcer of right calf with fat layer exposed: Secondary | ICD-10-CM | POA: Diagnosis not present

## 2020-01-18 DIAGNOSIS — S81801A Unspecified open wound, right lower leg, initial encounter: Secondary | ICD-10-CM | POA: Diagnosis not present

## 2020-01-18 DIAGNOSIS — S8011XD Contusion of right lower leg, subsequent encounter: Secondary | ICD-10-CM | POA: Diagnosis not present

## 2020-01-18 DIAGNOSIS — I872 Venous insufficiency (chronic) (peripheral): Secondary | ICD-10-CM | POA: Diagnosis not present

## 2020-01-18 DIAGNOSIS — L03115 Cellulitis of right lower limb: Secondary | ICD-10-CM | POA: Diagnosis not present

## 2020-01-18 DIAGNOSIS — I890 Lymphedema, not elsewhere classified: Secondary | ICD-10-CM | POA: Diagnosis not present

## 2020-01-18 NOTE — Progress Notes (Addendum)
[name], [name] ([id number]) Visit Report for 01/18/2020 Arrival Information Details Patient Name: Date of Service: [name], [name] 01/18/2020 1:15 PM Medical Record [id number] Patient Account Number: [id number] Date of Birth/Sex: Treating RN: [birth year] (84 y.o. [name], [name] Primary Care Amandeep Nesmith: [name] Other Clinician: Referring Everlina Gotts: Treating Alfreddie Consalvo/Extender:Robson, Vista Lawman, Moses Manners in Treatment: 14 Visit Information History Since Last Visit Added or deleted any medications: No Patient Arrived: [name] Any new allergies or adverse reactions: No Arrival Time: 13:30 Had a fall or experienced change in No Accompanied By: self activities of daily living that may affect Transfer Assistance: None risk of falls: Patient Identification Verified: Yes Signs or symptoms of abuse/neglect since last No Secondary Verification Process Yes visito Completed: Hospitalized since last visit: No Patient Requires Transmission- No Implantable device outside of the clinic excluding No Based Precautions: cellular tissue based products placed in the center Patient Has Alerts: Yes since last visit: Patient Alerts: Patient on Blood Has Dressing in Place as Prescribed: Yes Thinner Has Compression in Place as Prescribed: Yes Pain Present Now: Yes Electronic Signature(s) Signed: 01/18/2020 5:42:31 PM By: Deon Pilling Entered By: Deon Pilling on 01/18/2020 13:35:36 -------------------------------------------------------------------------------- Compression Therapy Details Patient Name: Date of Service: [name], [name] 01/18/2020 1:15 PM Medical Record [id number] Patient Account Number: [id number] Date of Birth/Sex: Treating RN: 10-06-1936 (84 y.o. [name] Primary Care Issabelle Mcraney: [name] Other Clinician: Referring Rowen Hur: Treating Amiri Riechers/Extender:Robson, Vista Lawman, Moses Manners in Treatment: 14 Compression Therapy  Performed for Wound Wound #1 Right,Distal,Posterior Lower Leg Assessment: Performed By: Clinician Levan Hurst, RN Compression Type: Three Layer Post Procedure Diagnosis Same as Pre-procedure Electronic Signature(s) Signed: 01/18/2020 5:59:01 PM By: Levan Hurst RN, BSN Entered By: Levan Hurst on 01/18/2020 14:05:09 -------------------------------------------------------------------------------- Encounter Discharge Information Details Patient Name: Date of Service: [name]. 01/18/2020 1:15 PM Medical Record [id number] Patient Account Number: [id number] Date of Birth/Sex: Treating RN: [birth year] (84 y.o. [name] Primary Care Kable Haywood: [name] Other Clinician: Referring Honi Name: Treating Yi Haugan/Extender:Robson, Vista Lawman, Moses Manners in Treatment: 14 Encounter Discharge Information Items Discharge Condition: Stable Ambulatory Status: Walker Discharge Destination: Home Transportation: Private Auto Accompanied By: self Schedule Follow-up Appointment: Yes Clinical Summary of Care: Electronic Signature(s) Signed: 01/18/2020 5:42:31 PM By: Deon Pilling Entered By: Deon Pilling on 01/18/2020 15:10:47 -------------------------------------------------------------------------------- Lower Extremity Assessment Details Patient Name: Date of Service: [name], [name] 01/18/2020 1:15 PM Medical Record [id number] Patient Account Number: [id number] Date of Birth/Sex: Treating RN: 09-23-1936 (84 y.o. [name], [name] Primary Care Zvi Duplantis: [name] Other Clinician: Referring Salena Ortlieb: Treating Annette Manzer/Extender:Robson, Vista Lawman, Moses Manners in Treatment: 14 Edema Assessment Assessed: [Left: No] [Right: No] Edema: [Left: N] [Right: o] Calf Left: Right: Point of Measurement: 37 cm From Medial Instep cm 31 cm Ankle Left: Right: Point of Measurement: 10 cm From Medial Instep cm 20 cm Vascular Assessment Pulses: Dorsalis  Pedis Palpable: [Right:Yes] Electronic Signature(s) Signed: 01/18/2020 5:42:31 PM By: Deon Pilling Entered By: Deon Pilling on 01/18/2020 13:39:49 -------------------------------------------------------------------------------- Multi Wound Chart Details Patient Name: Date of Service: [name] 01/18/2020 1:15 PM Medical Record [id number] Patient Account Number: [id number] Date of Birth/Sex: Treating RN: 04/28/1936 (84 y.o. [name] Primary Care Mammie Meras: [name] Other Clinician: Referring Leenah Seidner: Treating Prateek Knipple/Extender:Robson, Vista Lawman, Moses Manners in Treatment: 14 Vital Signs Height(in): 58 Pulse(bpm): 87 Weight(lbs): 111 Blood Pressure(mmHg): 119/70 Body Mass Index(BMI): 23 Temperature(F): 97.8 Respiratory 18 Rate(breaths/min): Photos: [1:No Photos] [N/A:N/A] Wound Location: [1:Right Lower Leg - Posterior, Distal] [N/A:N/A] Wounding Event: [  1:Trauma] [N/A:N/A] Primary Etiology: [1:Lymphedema] [N/A:N/A] Comorbid History: [1:Congestive Heart Failure, Hypertension, Received Radiation] [N/A:N/A] Date Acquired: [1:09/08/2019] [N/A:N/A] Weeks of Treatment: [1:14] [N/A:N/A] Wound Status: [1:Open] [N/A:N/A] Measurements L x W x D 1x1x0.7 [N/A:N/A] (cm) Area (cm) : [1:0.785] [N/A:N/A] Volume (cm) : [1:0.55] [N/A:N/A] % Reduction in Area: [1:83.30%] [N/A:N/A] % Reduction in Volume: [1:-16.80%] [N/A:N/A] Classification: [1:Full Thickness Without Exposed Support Structures] [N/A:N/A] Exudate Amount: [1:Medium] [N/A:N/A] Exudate Type: [1:Sanguinous] [N/A:N/A] Exudate Color: [1:red] [N/A:N/A] Wound Margin: [1:Well defined, not attached N/A] Granulation Amount: [1:Medium (34-66%)] [N/A:N/A] Granulation Quality: [1:Red, Pink] [N/A:N/A] Necrotic Amount: [1:Medium (34-66%)] [N/A:N/A] Exposed Structures: [1:Fat Layer (Subcutaneous N/A Tissue) Exposed: Yes Fascia: No Tendon: No Muscle: No Joint: No Bone: No] Epithelialization: [1:Small  (1-33%) Compression Therapy] [N/A:N/A N/A] Treatment Notes Electronic Signature(s) Signed: 01/18/2020 5:29:15 PM By: Linton Ham MD Signed: 01/18/2020 5:59:01 PM By: Levan Hurst RN, BSN Entered By: Linton Ham on 01/18/2020 14:37:48 -------------------------------------------------------------------------------- [place] Details Patient Name: Date of Service: Annette Blinks [name] 01/18/2020 1:15 PM Medical Record [id number] Patient Account Number: [id number] Date of Birth/Sex: Treating RN: 05/24/1936 (84 y.o. [name] Primary Care Sharise Lippy: [name] Other Clinician: Referring Ivanell Deshotel: Treating Indigo Barbian/Extender:Robson, Vista Lawman, Moses Manners in Treatment: 14 Active Inactive Wound/Skin Impairment Nursing Diagnoses: Impaired tissue integrity Knowledge deficit related to ulceration/compromised skin integrity Goals: Patient/caregiver will verbalize understanding of skin care regimen Date Initiated: 10/12/2019 Target Resolution Date: 02/05/2020 Goal Status: Active Ulcer/skin breakdown will have a volume reduction of 30% by week 4 Date Initiated: 10/12/2019 Date Inactivated: 11/09/2019 Target Resolution Date: 11/13/2019 Goal Status: Met Ulcer/skin breakdown will have a volume reduction of 50% by week 8 Date Initiated: 11/09/2019 Date Inactivated: 12/07/2019 Target Resolution Date: 12/11/2019 Goal Status: Met Ulcer/skin breakdown will have a volume reduction of 80% by week 12 Date Initiated: 12/07/2019 Date Inactivated: 01/04/2020 Target Resolution Date: 01/08/2020 Goal Status: Met Interventions: Assess patient/caregiver ability to obtain necessary supplies Assess patient/caregiver ability to perform ulcer/skin care regimen upon admission and as needed Assess ulceration(s) every visit Provide education on ulcer and skin care Notes: Electronic Signature(s) Signed: 01/18/2020 5:59:01 PM By: Levan Hurst RN, BSN Entered By: Levan Hurst  on 01/18/2020 17:42:39 -------------------------------------------------------------------------------- Pain Assessment Details Patient Name: Date of Service: [name] 01/18/2020 1:15 PM Medical Record [id number] Patient Account Number: [id number] Date of Birth/Sex: Treating RN: Jul 28, 1936 (84 y.o. [name] Primary Care Meghanne Pletz: [name] Other Clinician: Referring Avari Nevares: Treating Shavawn Stobaugh/Extender:Robson, Vista Lawman, Moses Manners in Treatment: 14 Active Problems Location of Pain Severity and Description of Pain Patient Has Paino Yes Site Locations Pain Location: Pain in Ulcers With Dressing Change: Yes Duration of the Pain. Constant / Intermittento Constant Rate the pain. Current Pain Level: 2 Worst Pain Level: 6 Least Pain Level: 2 Tolerable Pain Level: 3 Character of Pain Describe the Pain: Burning Pain Management and Medication Current Pain Management: Electronic Signature(s) Signed: 01/18/2020 5:42:31 PM By: Deon Pilling Entered By: Deon Pilling on 01/18/2020 13:36:25 -------------------------------------------------------------------------------- Patient/Caregiver Education Details Patient Name: Date of Service: [name] 3/8/2021andnbsp1:15 PM Medical Record [id number] Patient Account Number: [id number] Date of Birth/Gender: Treating RN: 11-22-1935 (84 y.o. [name] Primary Care Physician: [name] Other Clinician: Referring Physician: Treating Physician/Extender:Robson, Vista Lawman, Moses Manners in Treatment: 14 Education Assessment Education Provided To: Patient Education Topics Provided Wound/Skin Impairment: Methods: Explain/Verbal Responses: State content correctly [employer]) Signed: 01/18/2020 5:59:01 PM By: Levan Hurst RN, BSN Entered By: Levan Hurst on 01/18/2020 17:42:49 -------------------------------------------------------------------------------- Wound  Assessment Details Patient Name: Date of  Service: [name], [name] 01/18/2020 1:15 PM Medical Record Number:[id number] Patient Account Number: [id number] Date of Birth/Sex: Treating RN: [birth year] (84 y.o. [name] Primary Care Samiah Ricklefs: [name] Other Clinician: Referring Alicea Wente: Treating Lucian Baswell/Extender:Robson, Vista Lawman, Moses Manners in Treatment: 14 Wound Status Wound Number: 1 Primary Lymphedema Etiology: Wound Location: Right Lower Leg - Posterior, Distal Wound Open Wounding Event: Trauma Status: Date Acquired: 09/08/2019 Comorbid Congestive Heart Failure, Hypertension, Weeks Of Treatment: 14 History: Received Radiation History: Received Radiation Clustered Wound: No Photos Wound Measurements Length: (cm) 1 % Reduct Width: (cm) 1 % Reduct Depth: (cm) 0.7 Epitheli Area: (cm) 0.785 Tunneli Volume: (cm) 0.55 Undermi Wound Description Classification: Full Thickness Without Exposed Support Foul Odo Structures Slough/F Wound Well defined, not attached Margin: Exudate Medium Amount: Exudate Sanguinous Type: Exudate red Color: Wound Bed Granulation Amount: Medium (34-66%) Granulation Quality: Red, Pink Fascia E Necrotic Amount: Medium (34-66%) Fat Laye Necrotic Quality: Adherent Slough Tendon E Muscle E Joint Ex Bone Exp r After Cleansing: No ibrino Yes Exposed Structure xposed: No r (Subcutaneous Tissue) Exposed: Yes xposed: No xposed: No posed: No osed: No ion in Area: 83.3% ion in Volume: -16.8% alization: Small (1-33%) ng: No ning: No Electronic Signature(s) Signed: 01/19/2020 3:29:05 PM By: Mikeal Hawthorne EMT/HBOT Signed: 02/24/2020 9:04:28 AM By: Levan Hurst RN, BSN Previous Signature: 01/18/2020 5:42:31 PM Version By: Deon Pilling Entered By: Mikeal Hawthorne on 01/19/2020 14:41:41 -------------------------------------------------------------------------------- Vitals Details Patient Name: Date of Service: [name],  [name] 01/18/2020 1:15 PM Medical Record [id number] Patient Account Number: [id number] Date of Birth/Sex: Treating RN: 1935-11-25 (84 y.o. [name], [name] Primary Care Rucker Pridgeon: [name] Other Clinician: Referring Wenceslao Loper: Treating Biran Mayberry/Extender:Robson, Vista Lawman, Moses Manners in Treatment: 14 Vital Signs Time Taken: 13:30 Temperature (F): 97.8 Height (in): 58 Pulse (bpm): 63 Weight (lbs): 111 Respiratory Rate (breaths/min): 18 Body Mass Index (BMI): 23.2 Blood Pressure (mmHg): 119/70 Reference Range: 80 - 120 mg / dl Electronic Signature(s) Signed: 01/18/2020 5:42:31 PM By: Deon Pilling Entered By: Deon Pilling on 01/18/2020 13:36:04

## 2020-01-19 DIAGNOSIS — N3281 Overactive bladder: Secondary | ICD-10-CM | POA: Diagnosis not present
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
## 2020-01-22 ENCOUNTER — Ambulatory Visit: Payer: Medicare Other | Admitting: Physician Assistant

## 2020-01-25 ENCOUNTER — Encounter (HOSPITAL_BASED_OUTPATIENT_CLINIC_OR_DEPARTMENT_OTHER): Payer: Medicare Other | Admitting: Internal Medicine

## 2020-01-25 ENCOUNTER — Other Ambulatory Visit: Payer: Self-pay

## 2020-01-25 DIAGNOSIS — L97212 Non-pressure chronic ulcer of right calf with fat layer exposed: Secondary | ICD-10-CM | POA: Diagnosis not present

## 2020-01-25 DIAGNOSIS — I110 Hypertensive heart disease with heart failure: Secondary | ICD-10-CM | POA: Diagnosis not present

## 2020-01-25 DIAGNOSIS — S8011XD Contusion of right lower leg, subsequent encounter: Secondary | ICD-10-CM | POA: Diagnosis not present

## 2020-01-25 DIAGNOSIS — I890 Lymphedema, not elsewhere classified: Secondary | ICD-10-CM | POA: Diagnosis not present

## 2020-01-25 DIAGNOSIS — L03115 Cellulitis of right lower limb: Secondary | ICD-10-CM | POA: Diagnosis not present

## 2020-01-25 DIAGNOSIS — I872 Venous insufficiency (chronic) (peripheral): Secondary | ICD-10-CM | POA: Diagnosis not present

## 2020-01-25 NOTE — Progress Notes (Addendum)
Annette, [name] ([id number]) Visit Report for 01/25/2020 Arrival Information Details Patient Name: Date of Service: [name], [name] 01/25/2020 1:30 PM Medical Record [id number] Patient Account Number: [id number] Date of Birth/Sex: Treating RN: 11-13-1935 (84 y.o. [name] Primary Care Shirin Echeverry: [name] Other Clinician: Referring Skye Plamondon: Treating Fergus Throne/Extender:Robson, Vista Lawman, Moses Manners in Treatment: 15 Visit Information History Since Last Visit Added or deleted any medications: No Patient Arrived: [name] Any new allergies or adverse reactions: No Arrival Time: 13:30 Had a fall or experienced change in No Accompanied By: self activities of daily living that may affect Transfer Assistance: None risk of falls: Patient Identification Verified: Yes Signs or symptoms of abuse/neglect since last No Secondary Verification Process Yes visito Completed: Hospitalized since last visit: No Patient Requires Transmission- No Implantable device outside of the clinic excluding No Based Precautions: cellular tissue based products placed in the center Patient Has Alerts: Yes since last visit: Patient Alerts: Patient on Blood Has Dressing in Place as Prescribed: Yes Thinner Has Compression in Place as Prescribed: Yes Pain Present Now: Yes Electronic Signature(s) Signed: 01/25/2020 5:25:45 PM By: Kela Millin Entered By: Kela Millin on 01/25/2020 13:30:54 -------------------------------------------------------------------------------- Compression Therapy Details Patient Name: Date of Service: Annette Mallet C. 01/25/2020 1:30 PM Medical Record [id number] Patient Account Number: [id number] Date of Birth/Sex: Treating RN: 03-14-1936 (84 y.o. [name] Primary Care Shyhiem Beeney: [name] Other Clinician: Referring Council Munguia: Treating Ixchel Duck/Extender:Robson, Vista Lawman, Moses Manners in Treatment: 15 Compression  Therapy Performed for Wound Wound #1 Right,Distal,Posterior Lower Leg Assessment: Performed By: Clinician Levan Hurst, RN Compression Type: Three Layer Post Procedure Diagnosis Same as Pre-procedure Electronic Signature(s) Signed: 01/25/2020 5:44:18 PM By: Levan Hurst RN, BSN Entered By: Levan Hurst on 01/25/2020 13:59:56 -------------------------------------------------------------------------------- Encounter Discharge Information Details Patient Name: Date of Service: Annette Blinks [name] 01/25/2020 1:30 PM Medical Record [id number] Patient Account Number: [id number] Date of Birth/Sex: Treating RN: [birth year] (84 y.o. [name], [name] Primary Care Jowel Waltner: [name] Other Clinician: Referring Dametra Whetsel: Treating Victorio Creeden/Extender:Robson, Vista Lawman, Moses Manners in Treatment: 15 Encounter Discharge Information Items Discharge Condition: Stable Ambulatory Status: Walker Discharge Destination: Home Transportation: Private Auto Accompanied By: self Schedule Follow-up Appointment: Yes Clinical Summary of Care: Electronic Signature(s) Signed: 01/25/2020 5:25:09 PM By: Deon Pilling Entered By: Deon Pilling on 01/25/2020 14:46:11 -------------------------------------------------------------------------------- Lower Extremity Assessment Details Patient Name: Date of Service: Annette, [name] 01/25/2020 1:30 PM Medical Record [id number] Patient Account Number: [id number] Date of Birth/Sex: Treating RN: 12-26-1935 (84 y.o. [name] Primary Care Jozelyn Kuwahara: [name] Other Clinician: Referring Nikya Busler: Treating Albirtha Grinage/Extender:Robson, Vista Lawman, Moses Manners in Treatment: 15 Edema Assessment Assessed: [Left: No] [Right: No] Edema: [Left: N] [Right: o] Calf Left: Right: Point of Measurement: 37 cm From Medial Instep cm 33 cm Ankle Left: Right: Point of Measurement: 10 cm From Medial Instep cm 20 cm Vascular  Assessment Pulses: Dorsalis Pedis Palpable: [Right:Yes] Electronic Signature(s) Signed: 01/25/2020 5:25:45 PM By: Kela Millin Entered By: Kela Millin on 01/25/2020 13:42:41 -------------------------------------------------------------------------------- Multi Wound Chart Details Patient Name: Date of Service: Annette Blinks [name] 01/25/2020 1:30 PM Medical Record [id number] Patient Account Number: [id number] Date of Birth/Sex: Treating RN: July 23, 1936 (84 y.o. [name] Primary Care Finlee Milo: [name] Other Clinician: Referring Vint Pola: Treating Creola Krotz/Extender:Robson, Vista Lawman, Moses Manners in Treatment: 15 Vital Signs Height(in): 58 Pulse(bpm): 23 Weight(lbs): 111 Blood Pressure(mmHg): 128/81 Body Mass Index(BMI): 23 Temperature(F): 97.6 Respiratory 18 Rate(breaths/min): Photos: [N/A:N/A] Wound Location: Right Lower Leg - Posterior, N/A N/A Distal Wounding Event: Trauma  N/A N/A Primary Etiology: Lymphedema N/A N/A Comorbid History: Congestive Heart Failure, N/A N/A Hypertension, Received Radiation Date Acquired: 09/08/2019 N/A N/A Weeks of Treatment: 15 N/A N/A Wound Status: Open N/A N/A Measurements L x W x D 0.9x0.7x0.2 N/A N/A (cm) Area (cm) : 0.495 N/A N/A Volume (cm) : 0.099 N/A N/A % Reduction in Area: 89.50% N/A N/A % Reduction in Volume: 79.00% N/A N/A Classification: Full Thickness Without N/A N/A Exposed Support Structures Exudate Amount: Medium N/A N/A Exudate Type: Serosanguineous N/A N/A Exudate Color: red, brown N/A N/A Wound Margin: Well defined, not attached N/A N/A Granulation Amount: Medium (34-66%) N/A N/A Granulation Quality: Pink N/A N/A Necrotic Amount: Medium (34-66%) N/A N/A Exposed Structures: Fat Layer (Subcutaneous N/A N/A Tissue) Exposed: Yes Fascia: No Tendon: No Muscle: No Joint: No Bone: No Epithelialization: Medium (34-66%) N/A N/A Procedures Performed: Compression Therapy N/A  N/A Treatment Notes Wound #1 (Right, Distal, Posterior Lower Leg) 1. Cleanse With Wound Cleanser Soap and water 2. Periwound Care Moisturizing lotion 3. Primary Dressing Applied Collegen AG Hydrogel or K-Y Jelly 4. Secondary Dressing ABD Pad 6. Support Layer Applied 3 layer compression wrap Notes netting. patient declined the heel cup under compression wrap. Electronic Signature(s) Signed: 02/01/2020 5:34:39 PM By: Linton Ham MD Signed: 02/24/2020 9:03:51 AM By: Levan Hurst RN, BSN Previous Signature: 01/26/2020 10:32:20 AM Version By: Linton Ham MD Entered By: Linton Ham on 02/01/2020 16:01:53 -------------------------------------------------------------------------------- Multi-Disciplinary Care Plan Details Patient Name: Date of Service: Annette Blinks [name] 01/25/2020 1:30 PM Medical Record [id number] Patient Account Number: [id number] Date of Birth/Sex: Treating RN: 1936-05-13 (84 y.o. [name] Primary Care Remee [name]: [name] Other Clinician: Referring Texie Tupou: Treating Jacksyn Beeks/Extender:Robson, Vista Lawman, Moses Manners in Treatment: 15 Active Inactive Wound/Skin Impairment Nursing Diagnoses: Impaired tissue integrity Knowledge deficit related to ulceration/compromised skin integrity Goals: Patient/caregiver will verbalize understanding of skin care regimen Date Initiated: 10/12/2019 Target Resolution Date: 02/05/2020 Goal Status: Active Ulcer/skin breakdown will have a volume reduction of 30% by week 4 Date Initiated: 10/12/2019 Date Inactivated: 11/09/2019 Target Resolution Date: 11/13/2019 Goal Status: Met Ulcer/skin breakdown will have a volume reduction of 50% by week 8 Date Initiated: 11/09/2019 Date Inactivated: 12/07/2019 Target Resolution Date: 12/11/2019 Goal Status: Met Ulcer/skin breakdown will have a volume reduction of 80% by week 12 Date Initiated: 12/07/2019 Date Inactivated: 01/04/2020 Target Resolution Date:  01/08/2020 Goal Status: Met Interventions: Assess patient/caregiver ability to obtain necessary supplies Assess patient/caregiver ability to perform ulcer/skin care regimen upon admission and as needed Assess ulceration(s) every visit Provide education on ulcer and skin care Notes: Electronic Signature(s) Signed: 01/25/2020 5:44:18 PM By: Levan Hurst RN, BSN Entered By: Levan Hurst on 01/25/2020 13:57:23 -------------------------------------------------------------------------------- Pain Assessment Details Patient Name: Date of Service: Annette Blinks [name] 01/25/2020 1:30 PM Medical Record [id number] Patient Account Number: [id number] Date of Birth/Sex: Treating RN: 03-13-1936 (84 y.o. [name] Primary Care Venita Seng: [name] Other Clinician: Referring Gilad Dugger: Treating Dorrine Montone/Extender:Robson, Vista Lawman, Moses Manners in Treatment: 15 Active Problems Location of Pain Severity and Description of Pain Patient Has Paino Yes Site Locations Pain Location: Pain in Ulcers With Dressing Change: Yes Duration of the Pain. Constant / Intermittento Constant Rate the pain. Current Pain Level: 3 Worst Pain Level: 6 Least Pain Level: 3 Tolerable Pain Level: 3 Character of Pain Describe the Pain: Aching, Burning Pain Management and Medication Current Pain Management: Medication: Yes Rest: Yes Electronic Signature(s) Signed: 01/25/2020 5:25:45 PM By: Kela Millin Entered By: Kela Millin on 01/25/2020 13:31:38 -------------------------------------------------------------------------------- Patient/Caregiver Education Details  Patient Name: Date of Service: ROSELINDA, BAHENA 3/15/2021andnbsp1:30 PM Medical Record [id number] Patient Account Number: [id number] Date of Birth/Gender: Treating RN: 1935/12/05 (84 y.o. [name] Primary Care Physician: [name] Other Clinician: Referring Physician: Treating  Physician/Extender:Robson, Vista Lawman, Moses Manners in Treatment: 15 Education Assessment Education Provided To: Patient Education Topics Provided Wound/Skin Impairment: Methods: Explain/Verbal Responses: State content correctly [employer]) Signed: 01/25/2020 5:44:18 PM By: Levan Hurst RN, BSN Entered By: Levan Hurst on 01/25/2020 13:57:32 -------------------------------------------------------------------------------- Wound Assessment Details Patient Name: Date of Service: Annette Blinks [name] 01/25/2020 1:30 PM Medical Record [id number] Patient Account Number: [id number] Date of Birth/Sex: Treating RN: 08-Apr-1936 (84 y.o. [name] Primary Care Skarleth Delmonico: [name] Other Clinician: Referring Talayah Picardi: Treating Dequavious Harshberger/Extender:Robson, Vista Lawman, Moses Manners in Treatment: 15 Wound Status Wound Number: 1 Primary Lymphedema Etiology: Wound Location: Right Lower Leg - Posterior, Distal Wound Open Wounding Event: Trauma Status: Date Acquired: 09/08/2019 Comorbid Congestive Heart Failure, Hypertension, Weeks Of Treatment: 15 History: Received Radiation Clustered Wound: No Photos Wound Measurements Length: (cm) 0.9 % Reduc Width: (cm) 0.7 % Reduc Depth: (cm) 0.2 Epithel Area: (cm) 0.495 Tunnel Volume: (cm) 0.099 Underm Wound Description Classification: Full Thickness Without Exposed Support Foul Od Structures Slough/ Wound Well defined, not attached Margin: Exudate Medium Amount: Exudate Serosanguineous Type: Exudate red, brown Color: Wound Bed Granulation Amount: Medium (34-66%) Granulation Quality: Pink Fascia Necrotic Amount: Medium (34-66%) Fat Layer Necrotic Quality: Adherent Slough Tendon Exp Muscle Exp Joint Expo Bone Expos or After Cleansing: No Fibrino Yes Exposed Structure Exposed: No (Subcutaneous Tissue) Exposed: Yes osed: No osed: No sed: No ed: No tion in Area: 89.5% tion in Volume:  79% ialization: Medium (34-66%) ing: No ining: No Electronic Signature(s) Signed: 01/26/2020 4:45:30 PM By: Mikeal Hawthorne EMT/HBOT Signed: 02/24/2020 9:03:51 AM By: Levan Hurst RN, BSN Previous Signature: 01/25/2020 5:25:45 PM Version By: Kela Millin Previous Signature: 01/25/2020 5:44:18 PM Version By: Levan Hurst RN, BSN Entered By: Mikeal Hawthorne on 01/26/2020 15:31:54 -------------------------------------------------------------------------------- [place] Details Patient Name: Date of Service: Tamala Julian, ROSE [name] 01/25/2020 1:30 PM Medical Record [id number] Patient Account Number: [id number] Date of Birth/Sex: Treating RN: 03-27-1936 (84 y.o. [name] Primary Care Maddie Brazier: [name] Other Clinician: Referring Destaney Sarkis: Treating Laparis Durrett/Extender:Robson, Vista Lawman, Moses Manners in Treatment: 15 Vital Signs Time Taken: 13:30 Temperature (F): 97.6 Height (in): 58 Pulse (bpm): 66 Weight (lbs): 111 Respiratory Rate (breaths/min): 18 Body Mass Index (BMI): 23.2 Blood Pressure (mmHg): 128/81 Reference Range: 80 - 120 mg / dl Electronic Signature(s) Signed: 01/25/2020 5:25:45 PM By: Kela Millin Entered By: Kela Millin on 01/25/2020 13:32:12
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
## 2020-01-27 ENCOUNTER — Other Ambulatory Visit: Payer: Self-pay

## 2020-01-27 ENCOUNTER — Ambulatory Visit (INDEPENDENT_AMBULATORY_CARE_PROVIDER_SITE_OTHER): Payer: Medicare Other | Admitting: Physician Assistant

## 2020-01-27 ENCOUNTER — Encounter: Payer: Self-pay | Admitting: Physician Assistant

## 2020-01-27 VITALS — BP 116/78 | HR 69 | Ht 59.0 in | Wt 111.0 lb

## 2020-01-27 DIAGNOSIS — I712 Thoracic aortic aneurysm, without rupture, unspecified: Secondary | ICD-10-CM

## 2020-01-27 DIAGNOSIS — I5032 Chronic diastolic (congestive) heart failure: Secondary | ICD-10-CM | POA: Diagnosis not present

## 2020-01-27 DIAGNOSIS — E785 Hyperlipidemia, unspecified: Secondary | ICD-10-CM

## 2020-01-27 DIAGNOSIS — I480 Paroxysmal atrial fibrillation: Secondary | ICD-10-CM

## 2020-01-27 DIAGNOSIS — I38 Endocarditis, valve unspecified: Secondary | ICD-10-CM | POA: Diagnosis not present

## 2020-01-27 NOTE — Patient Instructions (Signed)
Medication Instructions:  Your physician recommends that you continue on your current medications as directed. Please refer to the Current Medication list given to you today.  *If you need a refill on your cardiac medications before your next appointment, please call your pharmacy*   Lab Work: None ordered  If you have labs (blood work) drawn today and your tests are completely normal, you will receive your results only by: . MyChart Message (if you have MyChart) OR . A paper copy in the mail If you have any lab test that is abnormal or we need to change your treatment, we will call you to review the results.   Testing/Procedures: None ordered   Follow-Up: At [facility], you and your health needs are our priority.  As part of our continuing mission to provide you with exceptional heart care, we have created designated Provider Care Teams.  These Care Teams include your primary Cardiologist (physician) and Advanced Practice Providers (APPs -  Physician Assistants and Nurse Practitioners) who all work together to provide you with the care you need, when you need it.  We recommend signing up for the patient portal called "MyChart".  Sign up information is provided on this After Visit Summary.  MyChart is used to connect with patients for Virtual Visits (Telemedicine).  Patients are able to view lab/test results, encounter notes, upcoming appointments, etc.  Non-urgent messages can be sent to your provider as well.   To learn more about what you can do with MyChart, go to [email or web address].    Your next appointment:   6 month(s)  The format for your next appointment:   In Person  Provider:   You may see Katarina Nelson, MD or one of the following Advanced Practice Providers on your designated Care Team:    Dayna Dunn, PA-C  Michele Lenze, PA-C    Other Instructions   

## 2020-02-01 ENCOUNTER — Other Ambulatory Visit: Payer: Self-pay

## 2020-02-01 ENCOUNTER — Encounter (HOSPITAL_BASED_OUTPATIENT_CLINIC_OR_DEPARTMENT_OTHER): Payer: Medicare Other | Admitting: Internal Medicine

## 2020-02-01 ENCOUNTER — Other Ambulatory Visit (HOSPITAL_COMMUNITY)
Admission: RE | Admit: 2020-02-01 | Discharge: 2020-02-01 | Disposition: A | Discharge status: Home / Self Care | Payer: Medicare Other | Source: Other Acute Inpatient Hospital | Attending: Internal Medicine | Admitting: Internal Medicine

## 2020-02-01 DIAGNOSIS — L97212 Non-pressure chronic ulcer of right calf with fat layer exposed: Secondary | ICD-10-CM | POA: Insufficient documentation

## 2020-02-01 DIAGNOSIS — L03115 Cellulitis of right lower limb: Secondary | ICD-10-CM | POA: Diagnosis not present

## 2020-02-01 DIAGNOSIS — I872 Venous insufficiency (chronic) (peripheral): Secondary | ICD-10-CM | POA: Diagnosis not present

## 2020-02-01 DIAGNOSIS — I110 Hypertensive heart disease with heart failure: Secondary | ICD-10-CM | POA: Diagnosis not present

## 2020-02-01 DIAGNOSIS — I890 Lymphedema, not elsewhere classified: Secondary | ICD-10-CM | POA: Diagnosis not present

## 2020-02-01 DIAGNOSIS — S8011XD Contusion of right lower leg, subsequent encounter: Secondary | ICD-10-CM | POA: Diagnosis not present
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
## 2020-02-01 NOTE — Progress Notes (Signed)
[name], [name] ([id number]) Visit Report for 02/01/2020 Arrival Information Details Patient Name: Date of Service: [name], [name] 02/01/2020 2:15 PM Medical Record [id number] Patient Account Number: [id number] Date of Birth/Sex: Treating RN: 11-Jul-1936 (84 y.o. [name] Primary Care Ahmet Schank: [name] Other Clinician: Referring Accalia Rigdon: Treating Kayda Allers/Extender:Robson, Vista Lawman, Moses Manners in Treatment: 88 Visit Information History Since Last Visit Added or deleted any medications: No Patient Arrived: [name] Any new allergies or adverse reactions: No Arrival Time: 14:41 Had a fall or experienced change in No Accompanied By: self activities of daily living that may affect Transfer Assistance: None risk of falls: Patient Identification Verified: Yes Signs or symptoms of abuse/neglect since last No Secondary Verification Process Yes visito Completed: Hospitalized since last visit: No Patient Requires Transmission- No Implantable device outside of the clinic excluding No Based Precautions: cellular tissue based products placed in the center Patient Has Alerts: Yes since last visit: Patient Alerts: Patient on Blood Has Dressing in Place as Prescribed: Yes Thinner Has Compression in Place as Prescribed: Yes Pain Present Now: No Electronic Signature(s) Signed: 02/01/2020 5:21:34 PM By: Kela Millin Entered By: Kela Millin on 02/01/2020 14:42:00 -------------------------------------------------------------------------------- Compression Therapy Details Patient Name: Date of Service: [name] 02/01/2020 2:15 PM Medical Record [id number] Patient Account Number: [id number] Date of Birth/Sex: Treating RN: 08-04-1936 (84 y.o. [name] Primary Care Sie Formisano: [name] Other Clinician: Referring Zeshan Sena: Treating Wirt Hemmerich/Extender:Robson, Vista Lawman, Moses Manners in Treatment: 16 Compression  Therapy Performed for Wound Wound #1 Right,Distal,Posterior Lower Leg Assessment: Performed By: Clinician Levan Hurst, RN Compression Type: Three Layer Post Procedure Diagnosis Same as Pre-procedure Electronic Signature(s) Signed: 02/01/2020 5:18:21 PM By: Levan Hurst RN, BSN Entered By: Levan Hurst on 02/01/2020 15:22:35 -------------------------------------------------------------------------------- Encounter Discharge Information Details Patient Name: Date of Service: [name] 02/01/2020 2:15 PM Medical Record [id number] Patient Account Number: [id number] Date of Birth/Sex: Treating RN: [birth year] (84 y.o. [name] Primary Care Reza Crymes: [name] Other Clinician: Referring Mercy Leppla: Treating Denina Rieger/Extender:Robson, Vista Lawman, Moses Manners in Treatment: 16 Encounter Discharge Information Items Post Procedure Vitals Discharge Condition: Stable Temperature (F): 97.9 Ambulatory Status: Walker Pulse (bpm): 63 Discharge Destination: Home Respiratory Rate (breaths/min): 18 Transportation: Private Auto Blood Pressure (mmHg): 137/73 Accompanied By: self Schedule Follow-up Appointment: Yes Clinical Summary of Care: Electronic Signature(s) Signed: 02/01/2020 5:27:21 PM By: Deon Pilling Entered By: Deon Pilling on 02/01/2020 16:16:21 -------------------------------------------------------------------------------- Lower Extremity Assessment Details Patient Name: Date of Service: [name], [name] 02/01/2020 2:15 PM Medical Record [id number] Patient Account Number: [id number] Date of Birth/Sex: Treating RN: May 10, 1936 (84 y.o. [name] Primary Care Sharonda Llamas: [name] Other Clinician: Referring Latrice Storlie: Treating Maribell Demeo/Extender:Robson, Vista Lawman, Moses Manners in Treatment: 16 Edema Assessment Assessed: [Left: No] [Right: No] Edema: [Left: N] [Right: o] Calf Left: Right: Point of Measurement: 37 cm From  Medial Instep cm 31.5 cm Ankle Left: Right: Point of Measurement: 10 cm From Medial Instep cm 21 cm Vascular Assessment Pulses: Dorsalis Pedis Palpable: [Right:Yes] Electronic Signature(s) Signed: 02/01/2020 5:21:34 PM By: Kela Millin Entered By: Kela Millin on 02/01/2020 14:42:51 -------------------------------------------------------------------------------- Multi Wound Chart Details Patient Name: Date of Service: [name]. 02/01/2020 2:15 PM Medical Record [id number] Patient Account Number: [id number] Date of Birth/Sex: Treating RN: 09-07-1936 (84 y.o. [name] Primary Care Jonatha Gagen: [name] Other Clinician: Referring Eugune Sine: Treating Delia Slatten/Extender:Robson, Vista Lawman, Moses Manners in Treatment: 16 Vital Signs Height(in): 58 Pulse(bpm): 63 Weight(lbs): 111 Blood Pressure(mmHg): 137/73 Body Mass Index(BMI): 23 Temperature(F): 97.9 Respiratory  18 Rate(breaths/min): Photos: [1:No Photos] [N/A:N/A] Wound Location: [1:Right, Distal, Posterior Lower Leg] [N/A:N/A] Wounding Event: [1:Trauma] [N/A:N/A] Primary Etiology: [1:Lymphedema] [N/A:N/A] Comorbid History: [1:Congestive Heart Failure, Hypertension, Received Radiation] [N/A:N/A] Date Acquired: [1:09/08/2019] [N/A:N/A] Weeks of Treatment: [1:16] [N/A:N/A] Wound Status: [1:Open] [N/A:N/A] Measurements L x W x D 0.8x0.7x0.6 [N/A:N/A] (cm) Area (cm) : [1:0.44] [N/A:N/A] Volume (cm) : [1:0.264] [N/A:N/A] % Reduction in Area: [1:90.70%] [N/A:N/A] % Reduction in Volume: [1:43.90%] [N/A:N/A] Classification: [1:Full Thickness Without Exposed Support Structures] [N/A:N/A] Exudate Amount: [1:Medium] [N/A:N/A] Exudate Type: [1:Serosanguineous] [N/A:N/A] Exudate Color: [1:red, brown] [N/A:N/A] Wound Margin: [1:Well defined, not attached N/A] Granulation Amount: [1:Medium (34-66%)] [N/A:N/A] Granulation Quality: [1:Pink] [N/A:N/A] Necrotic Amount: [1:Medium (34-66%)]  [N/A:N/A] Exposed Structures: [1:Fat Layer (Subcutaneous N/A Tissue) Exposed: Yes Fascia: No Tendon: No Muscle: No Joint: No Bone: No] Epithelialization: [1:Medium (34-66%)] [N/A:N/A] Debridement: [1:Debridement - Excisional N/A] Pre-procedure [1:15:20] [N/A:N/A] Verification/Time Out Taken: Tissue Debrided: [1:Subcutaneous, Slough] [N/A:N/A] Level: [1:Skin/Subcutaneous Tissue] [N/A:N/A] Debridement Area (sq cm):0.56 [N/A:N/A] Instrument: [1:Curette] [N/A:N/A] Specimen: [1:Swab] [N/A:N/A] Number of Specimens [1:1] [N/A:N/A] Taken: Bleeding: [1:Minimum] [N/A:N/A] Hemostasis Achieved: [1:Pressure] [N/A:N/A] Procedural Pain: [1:0] [N/A:N/A] Post Procedural Pain: [1:0] [N/A:N/A] Debridement Treatment Procedure was tolerated [N/A:N/A] Response: [1:well] Post Debridement [1:0.8x0.7x0.6] [N/A:N/A] Measurements L x W x D (cm) Post Debridement [1:0.264] [N/A:N/A] Volume: (cm) Procedures Performed: Compression Therapy [1:Debridement] [N/A:N/A] Treatment Notes Electronic Signature(s) Signed: 02/01/2020 5:18:21 PM By: Levan Hurst RN, BSN Signed: 02/01/2020 5:34:39 PM By: Linton Ham MD Entered By: Linton Ham on 02/01/2020 16:03:21 -------------------------------------------------------------------------------- Multi-Disciplinary Care Plan Details Patient Name: Date of Service: [name]. 02/01/2020 2:15 PM Medical Record [id number] Patient Account Number: [id number] Date of Birth/Sex: Treating RN: 1936-10-13 (84 y.o. [name] Primary Care Jemeka Wagler: [name] Other Clinician: Referring Kristelle Cavallaro: Treating Matteson Blue/Extender:Robson, Vista Lawman, Moses Manners in Treatment: 16 Active Inactive Wound/Skin Impairment Nursing Diagnoses: Impaired tissue integrity Knowledge deficit related to ulceration/compromised skin integrity Goals: Patient/caregiver will verbalize understanding of skin care regimen Date Initiated: 10/12/2019 Target Resolution  Date: 03/04/2020 Goal Status: Active Ulcer/skin breakdown will have a volume reduction of 30% by week 4 Date Initiated: 10/12/2019 Date Inactivated: 11/09/2019 Target Resolution Date: 11/13/2019 Goal Status: Met Ulcer/skin breakdown will have a volume reduction of 50% by week 8 Date Initiated: 11/09/2019 Date Inactivated: 12/07/2019 Target Resolution Date: 12/11/2019 Goal Status: Met Ulcer/skin breakdown will have a volume reduction of 80% by week 12 Date Initiated: 12/07/2019 Date Inactivated: 01/04/2020 Target Resolution Date: 01/08/2020 Goal Status: Met Interventions: Assess patient/caregiver ability to obtain necessary supplies Assess patient/caregiver ability to perform ulcer/skin care regimen upon admission and as needed Assess ulceration(s) every visit Provide education on ulcer and skin care Notes: Electronic Signature(s) Signed: 02/01/2020 5:18:21 PM By: Levan Hurst RN, BSN Entered By: Levan Hurst on 02/01/2020 17:08:20 -------------------------------------------------------------------------------- Pain Assessment Details Patient Name: Date of Service: [name] 02/01/2020 2:15 PM Medical Record [id number] Patient Account Number: [id number] Date of Birth/Sex: Treating RN: 1936-04-03 (84 y.o. [name] Primary Care Uriyah Raska: [name] Other Clinician: Referring Noreene Boreman: Treating Tyrrell Stephens/Extender:Robson, Vista Lawman, Moses Manners in Treatment: 16 Active Problems Location of Pain Severity and Description of Pain Patient Has Paino No Site Locations Pain Management and Medication Current Pain Management: Electronic Signature(s) Signed: 02/01/2020 5:18:21 PM By: Levan Hurst RN, BSN Entered By: Levan Hurst on 02/01/2020 15:28:53 -------------------------------------------------------------------------------- Patient/Caregiver Education Details Patient Name: Date of Service: [name] 3/22/2021andnbsp2:15 PM Medical Record  [id number] Patient Account Number: [id number] Date of Birth/Gender: Treating RN: [birth year] (83 y.o. [name] Primary Care Physician: [name]  Other Clinician: Referring Physician: Treating Physician/Extender:Robson, Vista Lawman, Moses Manners in Treatment: 16 Education Assessment Education Provided To: Patient Education Topics Provided Wound/Skin Impairment: Methods: Explain/Verbal Responses: State content correctly [employer]) Signed: 02/01/2020 5:18:21 PM By: Levan Hurst RN, BSN Entered By: Levan Hurst on 02/01/2020 17:08:31 -------------------------------------------------------------------------------- Wound Assessment Details Patient Name: Date of Service: [name] 02/01/2020 2:15 PM Medical Record [id number] Patient Account Number: [id number] Date of Birth/Sex: Treating RN: [birth year] (84 y.o. [name] Primary Care Vernis Eid: [name] Other Clinician: Referring Carmelia Tiner: Treating Chalese Peach/Extender:Robson, Vista Lawman, Moses Manners in Treatment: 16 Wound Status Wound Number: 1 Primary Lymphedema Etiology: Wound Location: Right, Distal, Posterior Lower Leg Wound Open Wounding Event: Trauma Status: Date Acquired: 09/08/2019 Comorbid Congestive Heart Failure, Hypertension, Weeks Of Treatment: 16 History: Received Radiation Clustered Wound: No Wound Measurements Length: (cm) 0.8 % Reduct Width: (cm) 0.7 % Reduct Depth: (cm) 0.6 Epitheli Area: (cm) 0.44 Tunneli Volume: (cm) 0.264 Undermi Wound Description Classification: Full Thickness Without Exposed Support Foul Odo Structures Slough/F Wound Well defined, not attached Margin: Exudate Medium Amount: Exudate Serosanguineous Type: Exudate red, brown Color: Wound Bed Granulation Amount: Medium (34-66%) Granulation Quality: Pink Fascia E Necrotic Amount: Medium (34-66%) Fat Laye Necrotic Quality: Adherent Slough Tendon E Muscle  E Joint Ex Bone Exp r After Cleansing: No ibrino Yes Exposed Structure xposed: No r (Subcutaneous Tissue) Exposed: Yes xposed: No xposed: No posed: No osed: No ion in Area: 90.7% ion in Volume: 43.9% alization: Medium (34-66%) ng: No ning: No Treatment Notes Wound #1 (Right, Distal, Posterior Lower Leg) 1. Cleanse With Wound Cleanser Soap and water 2. Periwound Care Moisturizing lotion 3. Primary Dressing Applied Endoform 4. Secondary Dressing ABD Pad 6. Support Layer Applied 3 layer compression wrap Notes netting. patient declined the heel cup under compression wrap. Electronic Signature(s) Signed: 02/01/2020 5:18:21 PM By: Levan Hurst RN, BSN Entered By: Levan Hurst on 02/01/2020 15:20:04 -------------------------------------------------------------------------------- [place] Details Patient Name: Date of Service: [name]. 02/01/2020 2:15 PM Medical Record [id number] Patient Account Number: [id number] Date of Birth/Sex: Treating RN: March 11, 1936 (84 y.o. [name] Primary Care Andren Bethea: [name] Other Clinician: Referring Marjoria Mancillas: Treating Lawernce Earll/Extender:Robson, Vista Lawman, Moses Manners in Treatment: 16 Vital Signs Time Taken: 14:42 Temperature (F): 97.9 Height (in): 58 Pulse (bpm): 63 Weight (lbs): 111 Respiratory Rate (breaths/min): 18 Body Mass Index (BMI): 23.2 Blood Pressure (mmHg): 137/73 Reference Range: 80 - 120 mg / dl Electronic Signature(s) Signed: 02/01/2020 5:18:21 PM By: Levan Hurst RN, BSN Entered By: Levan Hurst on 02/01/2020 15:28:47

## 2020-02-04 DIAGNOSIS — R202 Paresthesia of skin: Secondary | ICD-10-CM | POA: Diagnosis not present

## 2020-02-04 DIAGNOSIS — S81801D Unspecified open wound, right lower leg, subsequent encounter: Secondary | ICD-10-CM | POA: Diagnosis not present

## 2020-02-04 DIAGNOSIS — M1990 Unspecified osteoarthritis, unspecified site: Secondary | ICD-10-CM | POA: Diagnosis not present

## 2020-02-04 DIAGNOSIS — M5030 Other cervical disc degeneration, unspecified cervical region: Secondary | ICD-10-CM | POA: Diagnosis not present

## 2020-02-04 DIAGNOSIS — I10 Essential (primary) hypertension: Secondary | ICD-10-CM | POA: Diagnosis not present

## 2020-02-04 DIAGNOSIS — K219 Gastro-esophageal reflux disease without esophagitis: Secondary | ICD-10-CM | POA: Diagnosis not present

## 2020-02-05 LAB — AEROBIC CULTURE W GRAM STAIN (SUPERFICIAL SPECIMEN): Culture: NORMAL

## 2020-02-08 ENCOUNTER — Other Ambulatory Visit: Payer: Self-pay

## 2020-02-08 ENCOUNTER — Encounter (HOSPITAL_BASED_OUTPATIENT_CLINIC_OR_DEPARTMENT_OTHER): Payer: Medicare Other | Admitting: Internal Medicine

## 2020-02-08 DIAGNOSIS — I110 Hypertensive heart disease with heart failure: Secondary | ICD-10-CM | POA: Diagnosis not present

## 2020-02-08 DIAGNOSIS — L97212 Non-pressure chronic ulcer of right calf with fat layer exposed: Secondary | ICD-10-CM | POA: Diagnosis not present

## 2020-02-08 DIAGNOSIS — S8011XD Contusion of right lower leg, subsequent encounter: Secondary | ICD-10-CM | POA: Diagnosis not present

## 2020-02-08 DIAGNOSIS — L03115 Cellulitis of right lower limb: Secondary | ICD-10-CM | POA: Diagnosis not present

## 2020-02-08 DIAGNOSIS — I890 Lymphedema, not elsewhere classified: Secondary | ICD-10-CM | POA: Diagnosis not present

## 2020-02-08 DIAGNOSIS — I872 Venous insufficiency (chronic) (peripheral): Secondary | ICD-10-CM | POA: Diagnosis not present
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
## 2020-02-15 ENCOUNTER — Other Ambulatory Visit: Payer: Self-pay

## 2020-02-15 ENCOUNTER — Encounter (HOSPITAL_BASED_OUTPATIENT_CLINIC_OR_DEPARTMENT_OTHER): Payer: Medicare Other | Attending: Internal Medicine | Admitting: Internal Medicine

## 2020-02-15 DIAGNOSIS — I890 Lymphedema, not elsewhere classified: Secondary | ICD-10-CM | POA: Insufficient documentation

## 2020-02-15 DIAGNOSIS — I5032 Chronic diastolic (congestive) heart failure: Secondary | ICD-10-CM | POA: Diagnosis not present

## 2020-02-15 DIAGNOSIS — L03115 Cellulitis of right lower limb: Secondary | ICD-10-CM | POA: Insufficient documentation

## 2020-02-15 DIAGNOSIS — S8011XD Contusion of right lower leg, subsequent encounter: Secondary | ICD-10-CM | POA: Diagnosis not present

## 2020-02-15 DIAGNOSIS — I110 Hypertensive heart disease with heart failure: Secondary | ICD-10-CM | POA: Insufficient documentation

## 2020-02-15 DIAGNOSIS — L97212 Non-pressure chronic ulcer of right calf with fat layer exposed: Secondary | ICD-10-CM | POA: Insufficient documentation

## 2020-02-15 DIAGNOSIS — I4891 Unspecified atrial fibrillation: Secondary | ICD-10-CM | POA: Diagnosis not present

## 2020-02-15 DIAGNOSIS — Z7901 Long term (current) use of anticoagulants: Secondary | ICD-10-CM | POA: Insufficient documentation

## 2020-02-15 DIAGNOSIS — X58XXXD Exposure to other specified factors, subsequent encounter: Secondary | ICD-10-CM | POA: Insufficient documentation

## 2020-02-15 DIAGNOSIS — I351 Nonrheumatic aortic (valve) insufficiency: Secondary | ICD-10-CM | POA: Insufficient documentation
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
## 2020-02-17 NOTE — Progress Notes (Signed)
Annette, [name] ([id number]) Visit Report for 02/15/2020 Arrival Information Details Patient Name: Date of Service: [name], [name] 02/15/2020 2:15 PM Medical Record [id number] Patient Account Number: [id number] Date of Birth/Sex: Treating RN: 1936/01/17 (84 y.o. [name] Primary Care Tavares Levinson: [name] Other Clinician: Referring Bayron Dalto: Treating Rayel Santizo/Extender:Robson, Vista Lawman, Moses Manners in Treatment: 18 Visit Information History Since Last Visit All ordered tests and consults were completed: No Patient Arrived: [name] Added or deleted any medications: No Arrival Time: 14:06 Any new allergies or adverse reactions: No Accompanied By: self Had a fall or experienced change in No Transfer Assistance: None activities of daily living that may affect Patient Identification Verified: Yes risk of falls: Secondary Verification Process Yes Signs or symptoms of abuse/neglect since last No Completed: visito Patient Requires Transmission- No Hospitalized since last visit: No Based Precautions: Implantable device outside of the clinic excluding No Patient Has Alerts: Yes cellular tissue based products placed in the center Patient Alerts: Patient on Blood since last visit: Thinner Has Dressing in Place as Prescribed: Yes Has Compression in Place as Prescribed: Yes Pain Present Now: No Electronic Signature(s) Signed: 02/16/2020 6:43:44 PM By: Carlene Coria RN Entered By: Carlene Coria on 02/15/2020 14:06:35 -------------------------------------------------------------------------------- Compression Therapy Details Patient Name: Date of Service: [name], [name] 02/15/2020 2:15 PM Medical Record [id number] Patient Account Number: [id number] Date of Birth/Sex: Treating RN: 1936-07-11 (84 y.o. [name] Primary Care Avyukth Bontempo: [name] Other Clinician: Referring Joclyn Alsobrook: Treating Parmvir Boomer/Extender:Robson, Vista Lawman,  Moses Manners in Treatment: 18 Compression Therapy Performed for Wound Wound #1 Right,Distal,Posterior Lower Leg Assessment: Performed By: Clinician Levan Hurst, RN Compression Type: Three Layer Post Procedure Diagnosis Same as Pre-procedure Electronic Signature(s) Signed: 02/15/2020 5:09:30 PM By: Levan Hurst RN, BSN Entered By: Levan Hurst on 02/15/2020 15:02:11 -------------------------------------------------------------------------------- Encounter Discharge Information Details Patient Name: Date of Service: [name] 02/15/2020 2:15 PM Medical Record [id number] Patient Account Number: [id number] Date of Birth/Sex: Treating RN: 13-Mar-1936 (84 y.o. [name] Primary Care Sabah Zucco: [name] Other Clinician: Referring Socorro Kanitz: Treating Rogena Deupree/Extender:Robson, Vista Lawman, Moses Manners in Treatment: 18 Encounter Discharge Information Items Discharge Condition: Stable Ambulatory Status: Walker Discharge Destination: Home Transportation: Other Accompanied By: self Schedule Follow-up Appointment: Yes Clinical Summary of Care: Patient Declined Electronic Signature(s) Signed: 02/15/2020 4:55:48 PM By: Kela Millin Entered By: Kela Millin on 02/15/2020 15:09:29 -------------------------------------------------------------------------------- Lower Extremity Assessment Details Patient Name: Date of Service: [name]. 02/15/2020 2:15 PM Medical Record [id number] Patient Account Number: [id number] Date of Birth/Sex: Treating RN: 06-15-1936 (84 y.o. [name] Primary Care [name]: [name] Other Clinician: Referring Norabelle Kondo: Treating Bannon Giammarco/Extender:Robson, Vista Lawman, Moses Manners in Treatment: 18 Edema Assessment Assessed: [Left: No] [Right: No] Edema: [Left: N] [Right: o] Calf Left: Right: Point of Measurement: 37 cm From Medial Instep cm 32 cm Ankle Left: Right: Point of Measurement: 10 cm  From Medial Instep cm 22 cm Electronic Signature(s) Signed: 02/16/2020 6:43:44 PM By: Carlene Coria RN Entered By: Carlene Coria on 02/15/2020 14:09:06 -------------------------------------------------------------------------------- Multi Wound Chart Details Patient Name: Date of Service: [name]. 02/15/2020 2:15 PM Medical Record [id number] Patient Account Number: [id number] Date of Birth/Sex: Treating RN: 1936/05/07 (84 y.o. [name] Primary Care Mattalyn Anderegg: [name] Other Clinician: Referring [name]: Treating Tanzie Rothschild/Extender:Robson, Vista Lawman, Moses Manners in Treatment: 18 Vital Signs Height(in): 58 Pulse(bpm): 63 Weight(lbs): 111 Blood Pressure(mmHg): 126/95 Body Mass Index(BMI): 23 Temperature(F): 98.2 Respiratory 18 Rate(breaths/min): Photos: [1:No Photos] [N/A:N/A] Wound Location: [1:Right, Distal, Posterior Lower Leg] [  N/A:N/A] Wounding Event: [1:Trauma] [N/A:N/A] Primary Etiology: [1:Lymphedema] [N/A:N/A] Comorbid History: [1:Congestive Heart Failure, N/A Hypertension, Received Radiation] Date Acquired: [1:09/08/2019] [N/A:N/A] Weeks of Treatment: [1:18] [N/A:N/A] Wound Status: [1:Open] [N/A:N/A] Measurements L x W x D 0.6x0.5x0.2 [N/A:N/A] (cm) Area (cm) : [1:0.236] [N/A:N/A] Volume (cm) : [1:0.047] [N/A:N/A] % Reduction in Area: [1:95.00%] [N/A:N/A] % Reduction in Volume: 90.00% [N/A:N/A] Classification: [1:Full Thickness Without Exposed Support Structures] [N/A:N/A] Exudate Amount: [1:Medium] [N/A:N/A] Exudate Type: [1:Serosanguineous] [N/A:N/A] Exudate Color: [1:red, brown] [N/A:N/A] Wound Margin: [1:Well defined, not attached] [N/A:N/A] Granulation Amount: [1:Medium (34-66%)] [N/A:N/A] Granulation Quality: [1:Pink] [N/A:N/A] Necrotic Amount: [1:Medium (34-66%)] [N/A:N/A] Exposed Structures: [1:Fat Layer (Subcutaneous Tissue) Exposed: Yes Fascia: No Tendon: No Muscle: No Joint: No Bone: No] [N/A:N/A] Epithelialization:  [1:Medium (34-66%) Compression Therapy] [N/A:N/A N/A] Treatment Notes Wound #1 (Right, Distal, Posterior Lower Leg) 1. Cleanse With Wound Cleanser Soap and water 2. Periwound Care Moisturizing lotion 3. Primary Dressing Applied Endoform 4. Secondary Dressing Dry Gauze 6. Support Layer Applied 3 layer compression wrap Notes netting. patient declined the heel cup under compression wrap. Electronic Signature(s) Signed: 02/15/2020 5:09:30 PM By: Levan Hurst RN, BSN Signed: 02/15/2020 5:24:04 PM By: Linton Ham MD Entered By: Linton Ham on 02/15/2020 15:21:07 -------------------------------------------------------------------------------- Multi-Disciplinary Care Plan Details Patient Name: Date of Service: [name]. 02/15/2020 2:15 PM Medical Record [id number] Patient Account Number: [id number] Date of Birth/Sex: Treating RN: 1936/05/10 (84 y.o. [name] Primary Care Maris Abascal: [name] Other Clinician: Referring Handsome Anglin: Treating Daune Divirgilio/Extender:Robson, Vista Lawman, Moses Manners in Treatment: 18 Active Inactive Wound/Skin Impairment Nursing Diagnoses: Impaired tissue integrity Knowledge deficit related to ulceration/compromised skin integrity Goals: Patient/caregiver will verbalize understanding of skin care regimen Date Initiated: 10/12/2019 Target Resolution Date: 03/04/2020 Goal Status: Active Ulcer/skin breakdown will have a volume reduction of 30% by week 4 Date Initiated: 10/12/2019 Date Inactivated: 11/09/2019 Target Resolution Date: 11/13/2019 Goal Status: Met Ulcer/skin breakdown will have a volume reduction of 50% by week 8 Date Initiated: 11/09/2019 Date Inactivated: 12/07/2019 Target Resolution Date: 12/11/2019 Goal Status: Met Ulcer/skin breakdown will have a volume reduction of 80% by week 12 Date Initiated: 12/07/2019 Date Inactivated: 01/04/2020 Target Resolution Date: 01/08/2020 Goal Status: Met Interventions: Assess  patient/caregiver ability to obtain necessary supplies Assess patient/caregiver ability to perform ulcer/skin care regimen upon admission and as needed Assess ulceration(s) every visit Provide education on ulcer and skin care Notes: Electronic Signature(s) Signed: 02/15/2020 5:09:30 PM By: Levan Hurst RN, BSN Entered By: Levan Hurst on 02/15/2020 15:15:49 -------------------------------------------------------------------------------- Pain Assessment Details Patient Name: Date of Service: [name] 02/15/2020 2:15 PM Medical Record [id number] Patient Account Number: [id number] Date of Birth/Sex: Treating RN: June 22, 1936 (84 y.o. [name] Primary Care Sharise Lippy: [name] Other Clinician: Referring Treylin Burtch: Treating Alfred Eckley/Extender:Robson, Vista Lawman, Moses Manners in Treatment: 18 Active Problems Location of Pain Severity and Description of Pain Patient Has Paino No Site Locations Pain Management and Medication Current Pain Management: Electronic Signature(s) Signed: 02/16/2020 6:43:44 PM By: Carlene Coria RN Entered By: Carlene Coria on 02/15/2020 14:07:11 -------------------------------------------------------------------------------- Patient/Caregiver Education Details Patient Name: Date of Service: [name] 4/5/2021andnbsp2:15 PM Medical Record [phone] Patient Account Number: [id number] Date of Birth/Gender: Treating RN: 10-18-1936 (84 y.o. [name] Primary Care Physician: [name] Other Clinician: Referring Physician: Treating Physician/Extender:Robson, Vista Lawman, Moses Manners in Treatment: 18 Education Assessment Education Provided To: Patient Education Topics Provided Wound/Skin Impairment: Methods: Explain/Verbal Responses: State content correctly [employer]) Signed: 02/15/2020 5:09:30 PM By: Levan Hurst RN, BSN Entered By: Levan Hurst on 02/15/2020  15:16:00 -------------------------------------------------------------------------------- Wound Assessment Details Patient Name: Date of Service: [name], [name] 02/15/2020 2:15 PM Medical Record [id number] Patient Account Number: [id number] Date of Birth/Sex: Treating RN: June 14, 1936 (84 y.o. [name] Primary Care Jakori Burkett: [name] Other Clinician: Referring Milano Rosevear: Treating Adryan Druckenmiller/Extender:Robson, Vista Lawman, Moses Manners in Treatment: 18 Wound Status Wound Number: 1 Primary Lymphedema Etiology: Wound Location: Right, Distal, Posterior Lower Leg Wound Open Wounding Event: Trauma Status: Date Acquired: 09/08/2019 Comorbid Congestive Heart Failure, Hypertension, Weeks Of Treatment: 18 History: Received Radiation Clustered Wound: No Wound Measurements Length: (cm) 0.6 % Reduct Width: (cm) 0.5 % Reduct Depth: (cm) 0.2 Epitheli Area: (cm) 0.236 Tunneli Volume: (cm) 0.047 Undermi Wound Description Full Thickness Without Exposed Support Foul Odo Classification: Structures Slough/F Wound Well defined, not attached Margin: Exudate Medium Amount: Exudate Serosanguineous Type: Exudate red, brown Color: Wound Bed Granulation Amount: Medium (34-66%) Granulation Quality: Pink Fascia E Necrotic Amount: Medium (34-66%) Fat Laye Necrotic Quality: Adherent Slough Tendon E Muscle E Joint Ex Bone Exp r After Cleansing: No ibrino Yes Exposed Structure xposed: No r (Subcutaneous Tissue) Exposed: Yes xposed: No xposed: No posed: No osed: No ion in Area: 95% ion in Volume: 90% alization: Medium (34-66%) ng: No ning: No Treatment Notes Wound #1 (Right, Distal, Posterior Lower Leg) 1. Cleanse With Wound Cleanser Soap and water 2. Periwound Care Moisturizing lotion 3. Primary Dressing Applied Endoform 4. Secondary Dressing Dry Gauze 6. Support Layer Applied 3 layer compression wrap Notes netting. patient declined the heel cup under  compression wrap. Electronic Signature(s) Signed: 02/16/2020 6:43:44 PM By: Carlene Coria RN Entered By: Carlene Coria on 02/15/2020 14:11:09 -------------------------------------------------------------------------------- Vitals Details Patient Name: Date of Service: [name] 02/15/2020 2:15 PM Medical Record [id number] Patient Account Number: [id number] Date of Birth/Sex: Treating RN: 09/26/1936 (84 y.o. [name] Primary Care Wacey Zieger: [name] Other Clinician: Referring Adolf Ormiston: Treating Eldo Umanzor/Extender:Robson, Vista Lawman, Moses Manners in Treatment: 18 Vital Signs Time Taken: 14:06 Temperature (F): 98.2 Height (in): 58 Pulse (bpm): 66 Weight (lbs): 111 Respiratory Rate (breaths/min): 18 Body Mass Index (BMI): 23.2 Blood Pressure (mmHg): 126/95 Reference Range: 80 - 120 mg / dl Electronic Signature(s) Signed: 02/16/2020 6:43:44 PM By: Carlene Coria RN Entered By: Carlene Coria on 02/15/2020 14:07:03

## 2020-02-22 ENCOUNTER — Encounter (HOSPITAL_BASED_OUTPATIENT_CLINIC_OR_DEPARTMENT_OTHER): Payer: Medicare Other | Admitting: Internal Medicine

## 2020-02-22 ENCOUNTER — Other Ambulatory Visit: Payer: Self-pay

## 2020-02-22 DIAGNOSIS — I351 Nonrheumatic aortic (valve) insufficiency: Secondary | ICD-10-CM | POA: Diagnosis not present

## 2020-02-22 DIAGNOSIS — L97212 Non-pressure chronic ulcer of right calf with fat layer exposed: Secondary | ICD-10-CM | POA: Diagnosis not present

## 2020-02-22 DIAGNOSIS — I5032 Chronic diastolic (congestive) heart failure: Secondary | ICD-10-CM | POA: Diagnosis not present

## 2020-02-22 DIAGNOSIS — L03115 Cellulitis of right lower limb: Secondary | ICD-10-CM | POA: Diagnosis not present

## 2020-02-22 DIAGNOSIS — I110 Hypertensive heart disease with heart failure: Secondary | ICD-10-CM | POA: Diagnosis not present

## 2020-02-22 DIAGNOSIS — S8011XD Contusion of right lower leg, subsequent encounter: Secondary | ICD-10-CM | POA: Diagnosis not present
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
## 2020-02-23 NOTE — Progress Notes (Signed)
[name] ([id number]) Visit Report for 02/22/2020 Arrival Information Details Patient Name: Date of Service: [name], [name] 02/22/2020 1:30 PM Medical Record [id number] Patient Account Number: [id number] Date of Birth/Sex: Treating RN: 1936/03/25 (84 y.o. [name] Primary Care Trynity Skousen: [name] Other Clinician: Referring Oluwasemilore Pascuzzi: Treating Rowen Wilmer/Extender:Robson, Vista Lawman, Moses Manners in Treatment: 19 Visit Information History Since Last Visit Added or deleted any medications: No Patient Arrived: [name] Any new allergies or adverse reactions: No Arrival Time: 13:38 Had a fall or experienced change in No Accompanied By: self activities of daily living that may affect Transfer Assistance: None risk of falls: Patient Identification Verified: Yes Signs or symptoms of abuse/neglect since last No Secondary Verification Process Yes visito Completed: Hospitalized since last visit: No Patient Requires Transmission- No Implantable device outside of the clinic excluding No Based Precautions: cellular tissue based products placed in the center Patient Has Alerts: Yes since last visit: Patient Alerts: Patient on Blood Has Dressing in Place as Prescribed: Yes Thinner Has Compression in Place as Prescribed: Yes Pain Present Now: No Electronic Signature(s) Signed: 02/22/2020 5:40:42 PM By: Kela Millin Entered By: Kela Millin on 02/22/2020 13:39:02 -------------------------------------------------------------------------------- Compression Therapy Details Patient Name: Date of Service: Annette Mallet C. 02/22/2020 1:30 PM Medical Record [id number] Patient Account Number: [id number] Date of Birth/Sex: Treating RN: 1935/11/28 (84 y.o. [name] Primary Care Kaisei Gilbo: [name] Other Clinician: Referring Clovia Reine: Treating Kayte Borchard/Extender:Robson, Vista Lawman, Moses Manners in Treatment: 19 Compression  Therapy Performed for Wound Wound #1 Right,Distal,Posterior Lower Leg Assessment: Performed By: Clinician Levan Hurst, RN Compression Type: Three Layer Post Procedure Diagnosis Same as Pre-procedure Electronic Signature(s) Signed: 02/22/2020 6:12:10 PM By: Levan Hurst RN, BSN Entered By: Levan Hurst on 02/22/2020 14:11:04 -------------------------------------------------------------------------------- Encounter Discharge Information Details Patient Name: Date of Service: Annette Blinks [name] 02/22/2020 1:30 PM Medical Record [id number] Patient Account Number: [id number] Date of Birth/Sex: Treating RN: [birth year] (84 y.o. [name], [name] Primary Care Taela Charbonneau: [name] Other Clinician: Referring Shallyn Constancio: Treating Leiliana Foody/Extender:Robson, Vista Lawman, Moses Manners in Treatment: 19 Encounter Discharge Information Items Discharge Condition: Stable Ambulatory Status: Walker Discharge Destination: Home Transportation: Private Auto Accompanied By: self Schedule Follow-up Appointment: Yes Clinical Summary of Care: Electronic Signature(s) Signed: 02/22/2020 5:40:16 PM By: Deon Pilling Entered By: Deon Pilling on 02/22/2020 14:28:31 -------------------------------------------------------------------------------- Lower Extremity Assessment Details Patient Name: Date of Service: [name] 02/22/2020 1:30 PM Medical Record [id number] Patient Account Number: [id number] Date of Birth/Sex: Treating RN: 1936-11-10 (84 y.o. [name] Primary Care Eliska Hamil: [name] Other Clinician: Referring Romi Rathel: Treating Tyechia Allmendinger/Extender:Robson, Vista Lawman, Moses Manners in Treatment: 19 Edema Assessment Assessed: [Left: No] [Right: No] Edema: [Left: N] [Right: o] Calf Left: Right: Point of Measurement: 37 cm From Medial Instep cm 31.5 cm Ankle Left: Right: Point of Measurement: 10 cm From Medial Instep cm 20 cm Vascular  Assessment Pulses: Dorsalis Pedis Palpable: [Right:Yes] Electronic Signature(s) Signed: 02/22/2020 5:40:42 PM By: Kela Millin Entered By: Kela Millin on 02/22/2020 13:43:07 -------------------------------------------------------------------------------- Multi Wound Chart Details Patient Name: Date of Service: Annette Blinks [name] 02/22/2020 1:30 PM Medical Record [id number] Patient Account Number: [id number] Date of Birth/Sex: Treating RN: [birth year] (84 y.o. [name] Primary Care Eliora Nienhuis: [name] Other Clinician: Referring Tashawn Laswell: Treating Meli Faley/Extender:Robson, Vista Lawman, Moses Manners in Treatment: 19 Vital Signs Height(in): 58 Pulse(bpm): 53 Weight(lbs): 111 Blood Pressure(mmHg): 130/70 Body Mass Index(BMI): 23 Temperature(F): 97.7 Respiratory 18 Rate(breaths/min): Photos: [1:No Photos] [N/A:N/A] Wound Location: [1:Right, Distal, Posterior Lower Leg] [N/A:N/A] Wounding Event: [1:Trauma] [  N/A:N/A] Primary Etiology: [1:Lymphedema] [N/A:N/A] Comorbid History: [1:Congestive Heart Failure, Hypertension, Received Radiation] [N/A:N/A] Date Acquired: [1:09/08/2019] [N/A:N/A] Weeks of Treatment: [1:19] [N/A:N/A] Wound Status: [1:Open] [N/A:N/A] Measurements L x W x D 0.2x0.2x0.1 [N/A:N/A] (cm) Area (cm) : [1:0.031] [N/A:N/A] Volume (cm) : [1:0.003] [N/A:N/A] % Reduction in Area: [1:99.30%] [N/A:N/A] % Reduction in Volume: [1:99.40%] [N/A:N/A] Classification: [1:Full Thickness Without Exposed Support Structures] [N/A:N/A] Exudate Amount: [1:Small] [N/A:N/A] Exudate Type: [1:Serosanguineous] [N/A:N/A] Exudate Color: [1:red, brown] [N/A:N/A] Wound Margin: [1:Well defined, not attached N/A] Granulation Amount: [1:Large (67-100%)] [N/A:N/A] Granulation Quality: [1:Red] [N/A:N/A] Necrotic Amount: [1:None Present (0%)] [N/A:N/A] Exposed Structures: [1:Fat Layer (Subcutaneous N/A Tissue) Exposed: Yes Fascia: No Tendon: No Muscle: No  Joint: No Bone: No] Epithelialization: [1:Large (67-100%) Compression Therapy] [N/A:N/A N/A] Treatment Notes Wound #1 (Right, Distal, Posterior Lower Leg) 1. Cleanse With Wound Cleanser Soap and water 2. Periwound Care Moisturizing lotion 3. Primary Dressing Applied Endoform 4. Secondary Dressing Dry Gauze 6. Support Layer Applied 3 layer compression wrap Notes netting. patient declined the heel cup under compression wrap. Electronic Signature(s) Signed: 02/22/2020 6:12:10 PM By: Levan Hurst RN, BSN Signed: 02/23/2020 5:51:00 PM By: Linton Ham MD Entered By: Linton Ham on 02/22/2020 15:13:15 -------------------------------------------------------------------------------- Multi-Disciplinary Care Plan Details Patient Name: Date of Service: Annette Blinks [name] 02/22/2020 1:30 PM Medical Record [id number] Patient Account Number: [id number] Date of Birth/Sex: Treating RN: 17-Feb-1936 (84 y.o. [name] Primary Care Harrold Fitchett: [name] Other Clinician: Referring Nakeesha Bowler: Treating Nariah Morgano/Extender:Robson, Vista Lawman, Moses Manners in Treatment: 19 Active Inactive Wound/Skin Impairment Nursing Diagnoses: Impaired tissue integrity Knowledge deficit related to ulceration/compromised skin integrity Goals: Patient/caregiver will verbalize understanding of skin care regimen Date Initiated: 10/12/2019 Target Resolution Date: 03/04/2020 Goal Status: Active Ulcer/skin breakdown will have a volume reduction of 30% by week 4 Date Initiated: 10/12/2019 Date Inactivated: 11/09/2019 Target Resolution Date: 11/13/2019 Goal Status: Met Ulcer/skin breakdown will have a volume reduction of 50% by week 8 Date Initiated: 11/09/2019 Date Inactivated: 12/07/2019 Target Resolution Date: 12/11/2019 Goal Status: Met Ulcer/skin breakdown will have a volume reduction of 80% by week 12 Date Initiated: 12/07/2019 Date Inactivated: 01/04/2020 Target Resolution Date:  01/08/2020 Goal Status: Met Interventions: Assess patient/caregiver ability to obtain necessary supplies Assess patient/caregiver ability to perform ulcer/skin care regimen upon admission and as needed Assess ulceration(s) every visit Provide education on ulcer and skin care Notes: Electronic Signature(s) Signed: 02/22/2020 6:12:10 PM By: Levan Hurst RN, BSN Entered By: Levan Hurst on 02/22/2020 14:08:16 -------------------------------------------------------------------------------- Pain Assessment Details Patient Name: Date of Service: Annette Blinks [name] 02/22/2020 1:30 PM Medical Record [id number] Patient Account Number: [id number] Date of Birth/Sex: Treating RN: 10-09-1936 (84 y.o. [name] Primary Care Aubree Doody: [name] Other Clinician: Referring Libertie Hausler: Treating Zamarion Longest/Extender:Robson, Vista Lawman, Moses Manners in Treatment: 19 Active Problems Location of Pain Severity and Description of Pain Patient Has Paino No Site Locations Pain Management and Medication Current Pain Management: Electronic Signature(s) Signed: 02/22/2020 5:40:42 PM By: Kela Millin Entered By: Kela Millin on 02/22/2020 13:43:15 -------------------------------------------------------------------------------- Patient/Caregiver Education Details Patient Name: Date of Service: [name] 4/12/2021andnbsp1:30 PM Medical Record [id number] Patient Account Number: [id number] Date of Birth/Gender: Treating RN: 09-May-1936 (84 y.o. [name] Primary Care Physician: [name] Other Clinician: Referring Physician: Treating Physician/Extender:Robson, Vista Lawman, Moses Manners in Treatment: 19 Education Assessment Education Provided To: Patient Education Topics Provided Wound/Skin Impairment: Methods: Explain/Verbal Responses: State content correctly [employer]) Signed: 02/22/2020 6:12:10 PM By: Levan Hurst RN,  BSN Entered By: Levan Hurst on 02/22/2020 14:08:28 -------------------------------------------------------------------------------- Wound Assessment  Details Patient Name: Date of Service: JULEE, STOLL 02/22/2020 1:30 PM Medical Record [id number] Patient Account Number: [id number] Date of Birth/Sex: Treating RN: [birth year] (84 y.o. [name] Primary Care Adalina Dopson: [name] Other Clinician: Referring Shakiyla Kook: Treating Maribella Kuna/Extender:Robson, Vista Lawman, Moses Manners in Treatment: 19 Wound Status Wound Number: 1 Primary Lymphedema Etiology: Wound Location: Right, Distal, Posterior Lower Leg Wound Open Wounding Event: Trauma Status: Date Acquired: 09/08/2019 Comorbid Congestive Heart Failure, Hypertension, Weeks Of Treatment: 19 History: Received Radiation Clustered Wound: No Photos Photo Uploaded By: Mikeal Hawthorne on 02/23/2020 14:20:41 Wound Measurements Length: (cm) 0.2 % Reduct Width: (cm) 0.2 % Reduct Depth: (cm) 0.1 Epitheli Area: (cm) 0.031 Tunneli Volume: (cm) 0.003 Undermi Wound Description Classification: Full Thickness Without Exposed Support Foul Odo Structures Slough/F Wound Well defined, not attached Margin: Exudate Small Amount: Exudate Serosanguineous Type: Exudate red, brown Color: Wound Bed Granulation Amount: Large (67-100%) Granulation Quality: Red Fascia E Necrotic Amount: None Present (0%) Fat Laye Tendon E Muscle E Joint Ex Bone E r After Cleansing: No ibrino No Exposed Structure xposed: No r (Subcutaneous Tissue) Exposed: Yes xposed: No xposed: No posed: No xposed: No ion in Area: 99.3% ion in Volume: 99.4% alization: Large (67-100%) ng: No ning: No Treatment Notes Wound #1 (Right, Distal, Posterior Lower Leg) 1. Cleanse With Wound Cleanser Soap and water 2. Periwound Care Moisturizing lotion 3. Primary Dressing Applied Endoform 4. Secondary Dressing Dry Gauze 6. Support Layer  Applied 3 layer compression wrap Notes netting. patient declined the heel cup under compression wrap. Electronic Signature(s) Signed: 02/22/2020 5:40:42 PM By: Kela Millin Entered By: Kela Millin on 02/22/2020 13:44:14 -------------------------------------------------------------------------------- Vitals Details Patient Name: Date of Service: Tamala Julian, ROSE [name] 02/22/2020 1:30 PM Medical Record [id number] Patient Account Number: [id number] Date of Birth/Sex: Treating RN: [birth year] (84 y.o. [name] Primary Care Mayanna Garlitz: [name] Other Clinician: Referring Kacee Koren: Treating Levy Wellman/Extender:Robson, Vista Lawman, Moses Manners in Treatment: 19 Vital Signs Time Taken: 13:39 Temperature (F): 97.7 Height (in): 58 Pulse (bpm): 63 Weight (lbs): 111 Respiratory Rate (breaths/min): 18 Body Mass Index (BMI): 23.2 Blood Pressure (mmHg): 130/70 Reference Range: 80 - 120 mg / dl Electronic Signature(s) Signed: 02/22/2020 5:40:42 PM By: Kela Millin Entered By: Kela Millin on 02/22/2020 13:39:29

## 2020-02-24 ENCOUNTER — Ambulatory Visit: Payer: Medicare Other | Admitting: Physician Assistant

## 2020-02-24 DIAGNOSIS — M542 Cervicalgia: Secondary | ICD-10-CM | POA: Diagnosis not present

## 2020-02-24 DIAGNOSIS — S22000G Wedge compression fracture of unspecified thoracic vertebra, subsequent encounter for fracture with delayed healing: Secondary | ICD-10-CM | POA: Diagnosis not present

## 2020-02-24 DIAGNOSIS — I10 Essential (primary) hypertension: Secondary | ICD-10-CM | POA: Diagnosis not present

## 2020-02-24 DIAGNOSIS — M810 Age-related osteoporosis without current pathological fracture: Secondary | ICD-10-CM | POA: Diagnosis not present

## 2020-02-24 DIAGNOSIS — M4120 Other idiopathic scoliosis, site unspecified: Secondary | ICD-10-CM | POA: Diagnosis not present

## 2020-02-24 DIAGNOSIS — M5416 Radiculopathy, lumbar region: Secondary | ICD-10-CM | POA: Diagnosis not present

## 2020-02-24 NOTE — Progress Notes (Signed)
Annette, [name] ([id number]) Visit Report for 01/04/2020 Arrival Information Details Patient Name: Date of Service: Annette, PUSKAS 01/04/2020 11:00 AM Medical Record [id number] Patient Account Number: [id number] Date of Birth/Sex: Treating RN: 01-22-1936 (84 y.o. [name] Primary Care Shyleigh Daughtry: [name] Other Clinician: Referring Keriana Sarsfield: Treating Valentina Alcoser/Extender:Robson, Vista Lawman, Moses Manners in Treatment: 12 Visit Information History Since Last Visit Added or deleted any medications: No Patient Arrived: [name] Any new allergies or adverse reactions: No Arrival Time: 10:56 Had a fall or experienced change in No Accompanied By: self activities of daily living that may affect Transfer Assistance: None risk of falls: Patient Identification Verified: Yes Signs or symptoms of abuse/neglect since last No Secondary Verification Process Yes visito Completed: Hospitalized since last visit: No Patient Requires Transmission- No Implantable device outside of the clinic excluding No Based Precautions: cellular tissue based products placed in the center Patient Has Alerts: Yes since last visit: Patient Alerts: Patient on Blood Has Dressing in Place as Prescribed: Yes Thinner Pain Present Now: No Electronic Signature(s) Signed: 02/24/2020 9:23:26 AM By: Sandre Kitty Entered By: Sandre Kitty on 01/04/2020 11:03:32 -------------------------------------------------------------------------------- Compression Therapy Details Patient Name: Date of Service: Annette Mallet C. 01/04/2020 11:00 AM Medical Record [id number] Patient Account Number: [id number] Date of Birth/Sex: Treating RN: 10/03/1936 (84 y.o. [name] Primary Care Cheyrl Buley: [name] Other Clinician: Referring Tenelle Andreason: Treating Merve Hotard/Extender:Robson, Vista Lawman, Moses Manners in Treatment: 12 Compression Therapy Performed for Wound Wound #1  Right,Distal,Posterior Lower Leg Assessment: Performed By: Clinician Levan Hurst, RN Compression Type: Three Layer Post Procedure Diagnosis Same as Pre-procedure Electronic Signature(s) Signed: 01/07/2020 8:56:01 AM By: Levan Hurst RN, BSN Entered By: Levan Hurst on 01/04/2020 11:31:03 -------------------------------------------------------------------------------- Encounter Discharge Information Details Patient Name: Date of Service: Annette Blinks [name] 01/04/2020 11:00 AM Medical Record [id number] Patient Account Number: [id number] Date of Birth/Sex: Treating RN: 1936/04/10 (84 y.o. [name], [name] Primary Care Leatrice Parilla: [name] Other Clinician: Referring Don Giarrusso: Treating Solon Alban/Extender:Robson, Vista Lawman, Moses Manners in Treatment: 12 Encounter Discharge Information Items Discharge Condition: Stable Ambulatory Status: Walker Discharge Destination: Home Transportation: Private Auto Accompanied By: self Schedule Follow-up Appointment: Yes Clinical Summary of Care: Electronic Signature(s) Signed: 01/04/2020 5:30:41 PM By: Deon Pilling Entered By: Deon Pilling on 01/04/2020 11:50:27 -------------------------------------------------------------------------------- Lower Extremity Assessment Details Patient Name: Date of Service: Annette, [name] 01/04/2020 11:00 AM Medical Record [id number] Patient Account Number: [id number] Date of Birth/Sex: Treating RN: 24-Nov-1935 (84 y.o. [name] Primary Care Albina Gosney: [name] Other Clinician: Referring Veronique Warga: Treating Aubrianne Molyneux/Extender:Robson, Vista Lawman, Moses Manners in Treatment: 12 Edema Assessment Assessed: [Left: No] [Right: No] Edema: [Left: N] [Right: o] Calf Left: Right: Point of Measurement: 37 cm From Medial Instep cm 31 cm Ankle Left: Right: Point of Measurement: 10 cm From Medial Instep cm 20 cm Vascular Assessment Pulses: Dorsalis Pedis Palpable:  [Right:Yes] Electronic Signature(s) Signed: 01/07/2020 8:56:01 AM By: Levan Hurst RN, BSN Entered By: Levan Hurst on 01/04/2020 11:20:32 -------------------------------------------------------------------------------- Multi Wound Chart Details Patient Name: Date of Service: Annette Mallet C. 01/04/2020 11:00 AM Medical Record [id number] Patient Account Number: [id number] Date of Birth/Sex: Treating RN: Sep 26, 1936 (84 y.o. [name] Primary Care Brianne Maina: [name] Other Clinician: Referring Terelle [name]: Treating Kairo Laubacher/Extender:Robson, Vista Lawman, Moses Manners in Treatment: 12 Vital Signs Height(in): 58 Pulse(bpm): 71 Weight(lbs): 111 Blood Pressure(mmHg): 144/86 Body Mass Index(BMI): 23 Temperature(F): 97.7 Respiratory 18 Rate(breaths/min): Photos: [1:No Photos] [N/A:N/A] Wound Location: [1:Right Lower Leg - Posterior, Distal] [N/A:N/A] Wounding Event: [1:Trauma] [N/A:N/A] Primary Etiology: [1:Lymphedema] [  N/A:N/A] Comorbid History: [1:Congestive Heart Failure, Hypertension, Received Radiation] [N/A:N/A] Date Acquired: [1:09/08/2019] [N/A:N/A] Weeks of Treatment: [1:12] [N/A:N/A] Wound Status: [1:Open] [N/A:N/A] Measurements L x W x D 0.7x0.5x0.9 [N/A:N/A] (cm) Area (cm) : [1:0.275] [N/A:N/A] Volume (cm) : [1:0.247] [N/A:N/A] % Reduction in Area: [1:94.20%] [N/A:N/A] % Reduction in Volume: 47.60% [N/A:N/A] Position 1 (o'clock): [1:9] Maximum Distance 1 [1:1.2] (cm): Tunneling: [1:Yes] [N/A:N/A] Classification: [1:Full Thickness Without Exposed Support Structures] [N/A:N/A] Exudate Amount: [1:Medium] [N/A:N/A] Exudate Type: [1:Serosanguineous] [N/A:N/A] Exudate Color: [1:red, brown] [N/A:N/A] Wound Margin: [1:Well defined, not attached N/A] Granulation Amount: [1:Large (67-100%)] [N/A:N/A] Granulation Quality: [1:Red] [N/A:N/A] Necrotic Amount: [1:None Present (0%)] [N/A:N/A] Exposed Structures: [1:Fat Layer (Subcutaneous N/A Tissue)  Exposed: Yes Fascia: No Tendon: No Muscle: No Joint: No Bone: No] Epithelialization: [1:Small (1-33%) Compression Therapy] [N/A:N/A N/A] Treatment Notes Wound #1 (Right, Distal, Posterior Lower Leg) 1. Cleanse With Wound Cleanser Soap and water 2. Periwound Care Moisturizing lotion 3. Primary Dressing Applied Other primary dressing (specifiy in notes) 4. Secondary Dressing ABD Pad 6. Support Layer Applied 3 layer compression wrap Notes primary dressing iodosorb packing strip. netting. Electronic Signature(s) Signed: 01/04/2020 5:55:43 PM By: Linton Ham MD Signed: 01/07/2020 8:56:01 AM By: Levan Hurst RN, BSN Entered By: Linton Ham on 01/04/2020 13:03:31 -------------------------------------------------------------------------------- Multi-Disciplinary Care Plan Details Patient Name: Date of Service: Annette Blinks [name] 01/04/2020 11:00 AM Medical Record [id number] Patient Account Number: [id number] Date of Birth/Sex: Treating RN: 12/28/1935 (84 y.o. [name] Primary Care Aziah Kaiser: [name] Other Clinician: Referring Raigen Jagielski: Treating Cayce Quezada/Extender:Robson, Vista Lawman, Moses Manners in Treatment: 12 Active Inactive Wound/Skin Impairment Nursing Diagnoses: Impaired tissue integrity Knowledge deficit related to ulceration/compromised skin integrity Goals: Patient/caregiver will verbalize understanding of skin care regimen Date Initiated: 10/12/2019 Target Resolution Date: 02/05/2020 Goal Status: Active Ulcer/skin breakdown will have a volume reduction of 30% by week 4 Date Initiated: 10/12/2019 Date Inactivated: 11/09/2019 Target Resolution Date: 11/13/2019 Goal Status: Met Ulcer/skin breakdown will have a volume reduction of 50% by week 8 Date Initiated: 11/09/2019 Date Inactivated: 12/07/2019 Target Resolution Date: 12/11/2019 Goal Status: Met Ulcer/skin breakdown will have a volume reduction of 80% by week 12 Date Initiated:  12/07/2019 Date Inactivated: 01/04/2020 Target Resolution Date: 01/08/2020 Goal Status: Met Interventions: Assess patient/caregiver ability to obtain necessary supplies Assess patient/caregiver ability to perform ulcer/skin care regimen upon admission and as needed Assess ulceration(s) every visit Provide education on ulcer and skin care Notes: Electronic Signature(s) Signed: 01/07/2020 8:56:01 AM By: Levan Hurst RN, BSN Entered By: Levan Hurst on 01/04/2020 11:29:55 -------------------------------------------------------------------------------- Pain Assessment Details Patient Name: Date of Service: Annette Mallet C. 01/04/2020 11:00 AM Medical Record [id number] Patient Account Number: [id number] Date of Birth/Sex: Treating RN: Mar 31, 1936 (84 y.o. [name] Primary Care Amman Bartel: [name] Other Clinician: Referring Graeden Bitner: Treating Brett Soza/Extender:Robson, Vista Lawman, Moses Manners in Treatment: 12 Active Problems Location of Pain Severity and Description of Pain Patient Has Paino No Site Locations Pain Management and Medication Current Pain Management: Electronic Signature(s) Signed: 01/07/2020 8:56:01 AM By: Levan Hurst RN, BSN Signed: 02/24/2020 9:23:26 AM By: Sandre Kitty Entered By: Sandre Kitty on 01/04/2020 11:05:35 -------------------------------------------------------------------------------- Patient/Caregiver Education Details Patient Name: Date of Service: [name], [name] 2/22/2021andnbsp11:00 AM Medical Record Patient Account Number: [id number] Number: Treating RN: Levan Hurst Date of Birth/Gender: May 06, 1936 (84 y.o. F) Other Clinician: Primary Care Physician: Lyanne Co Referring Physician: Physician/Extender: Lavena Bullion in Treatment: 12 Education Assessment Education Provided To: Patient Education Topics Provided Wound/Skin Impairment: Methods:  Explain/Verbal Responses: State content correctly  Electronic Signature(s) Signed: 01/07/2020 8:56:01 AM By: Levan Hurst RN, BSN Entered By: Levan Hurst on 01/04/2020 11:30:07 -------------------------------------------------------------------------------- Wound Assessment Details Patient Name: Date of Service: Annette Blinks [name] 01/04/2020 11:00 AM Medical Record [id number] Patient Account Number: [id number] Date of Birth/Sex: Treating RN: 1936/01/02 (84 y.o. [name] Primary Care Refujio Haymer: [name] Other Clinician: Referring Shyla Gayheart: Treating Saydi Kobel/Extender:Robson, Vista Lawman, Moses Manners in Treatment: 12 Wound Status Wound Number: 1 Primary Lymphedema Etiology: Wound Location: Right Lower Leg - Posterior, Distal Wound Open Wounding Event: Trauma Status: Date Acquired: 09/08/2019 Comorbid Congestive Heart Failure, Hypertension, Weeks Of Treatment: 12 History: Received Radiation Clustered Wound: No Photos Wound Measurements Length: (cm) 0.7 Width: (cm) 0.5 Depth: (cm) 0.9 Area: (cm) 0.275 Volume: (cm) 0.247 % Reduction in Area: 94.2% % Reduction in Volume: 47.6% Epithelialization: Small (1-33%) Tunneling: Yes Position (o'clock): 9 Maximum Distance: (cm) 1.2 Undermining: No Wound Description Classification: Full Thickness Without Exposed Support [place] Wound Well defined, not attached Margin: Exudate Medium Amount: Exudate Serosanguineous Type: Exudate red, brown Color: Wound Bed Granulation Amount: Large (67-100%) Granulation Quality: Red Fascia Necrotic Amount: None Present (0%) Fat Lay Tendon Muscle Joint Expos Bone Expose dor After Cleansing: No /Fibrino No Exposed Structure Exposed: No er (Subcutaneous Tissue) Exposed: Yes Exposed: No Exposed: No ed: No d: No Electronic Signature(s) Signed: 01/05/2020 5:16:59 PM By: Mikeal Hawthorne EMT/HBOT Signed: 01/07/2020 8:56:01 AM By: Levan Hurst  RN, BSN Entered By: Mikeal Hawthorne on 01/05/2020 12:01:05 -------------------------------------------------------------------------------- Vitals Details Patient Name: Date of Service: [name], Annette C. 01/04/2020 11:00 AM Medical Record [id number] Patient Account Number: [id number] Date of Birth/Sex: Treating RN: November 23, 1935 (84 y.o. [name] Primary Care Roger Kettles: [name] Other Clinician: Referring Tyke Outman: Treating Angelynn Lemus/Extender:Robson, Vista Lawman, Moses Manners in Treatment: 12 Vital Signs Time Taken: 11:03 Temperature (F): 97.7 Height (in): 58 Pulse (bpm): 81 Weight (lbs): 111 Respiratory Rate (breaths/min): 18 Body Mass Index (BMI): 23.2 Blood Pressure (mmHg): 144/86 Reference Range: 80 - 120 mg / dl Electronic Signature(s) Signed: 02/24/2020 9:23:26 AM By: Sandre Kitty Entered By: Sandre Kitty on 01/04/2020 11:05:29

## 2020-02-24 NOTE — Progress Notes (Signed)
Annette, [name] ([id number]) Visit Report for 02/08/2020 Arrival Information Details Patient Name: Date of Service: [name], [name] 02/08/2020 2:00 PM Medical Record [id number] Patient Account Number: [id number] Date of Birth/Sex: Treating RN: 1936-05-11 (84 y.o. [name] Primary Care Shelina Luo: [name] Other Clinician: Referring Danel Requena: Treating Ishaaq Penna/Extender:Robson, Vista Lawman, Moses Manners in Treatment: 17 Visit Information History Since Last Visit Added or deleted any medications: No Patient Arrived: [name] Any new allergies or adverse reactions: No Arrival Time: 14:07 Had a fall or experienced change in No Accompanied By: self activities of daily living that may affect Transfer Assistance: None risk of falls: Patient Identification Verified: Yes Signs or symptoms of abuse/neglect since last No Secondary Verification Process Yes visito Completed: Hospitalized since last visit: No Patient Requires Transmission- No Implantable device outside of the clinic excluding No Based Precautions: cellular tissue based products placed in the center Patient Has Alerts: Yes since last visit: Patient Alerts: Patient on Blood Has Dressing in Place as Prescribed: Yes Thinner Pain Present Now: No Electronic Signature(s) Signed: 02/24/2020 9:21:08 AM By: Sandre Kitty Entered By: Sandre Kitty on 02/08/2020 14:08:24 -------------------------------------------------------------------------------- Compression Therapy Details Patient Name: Date of Service: [name]. 02/08/2020 2:00 PM Medical Record [id number] Patient Account Number: [id number] Date of Birth/Sex: Treating RN: [birth year] (84 y.o. Elam Dutch Primary Care Keyli Duross: [name] Other Clinician: Referring Tinaya Ceballos: Treating Hollyanne Schloesser/Extender:Robson, Vista Lawman, Moses Manners in Treatment: 17 Compression Therapy Performed for Wound Wound #1  Right,Distal,Posterior Lower Leg Assessment: Performed By: Clinician Deon Pilling, RN Compression Type: Three Layer Post Procedure Diagnosis Same as Pre-procedure Electronic Signature(s) Signed: 02/08/2020 5:31:53 PM By: Baruch Gouty RN, BSN Entered By: Baruch Gouty on 02/08/2020 14:54:36 -------------------------------------------------------------------------------- Encounter Discharge Information Details Patient Name: Date of Service: [name] [name] 02/08/2020 2:00 PM Medical Record [id number] Patient Account Number: [id number] Date of Birth/Sex: Treating RN: Jul 23, 1936 (84 y.o. [name], [name] Primary Care Issaic Welliver: [name] Other Clinician: Referring Dannie Woolen: Treating Salsabeel Gorelick/Extender:Robson, Vista Lawman, Moses Manners in Treatment: 17 Encounter Discharge Information Items Discharge Condition: Stable Ambulatory Status: Walker Discharge Destination: Home Transportation: Private Auto Accompanied By: self Schedule Follow-up Appointment: Yes Clinical Summary of Care: Electronic Signature(s) Signed: 02/08/2020 4:48:26 PM By: Deon Pilling Entered By: Deon Pilling on 02/08/2020 15:10:10 -------------------------------------------------------------------------------- Lower Extremity Assessment Details Patient Name: Date of Service: Annette, [name] 02/08/2020 2:00 PM Medical Record [id number] Patient Account Number: [id number] Date of Birth/Sex: Treating RN: [birth year] (84 y.o. Orvan Falconer Primary Care Jeana Kersting: [name] Other Clinician: Referring Alexcis Bicking: Treating Mattie Novosel/Extender:Robson, Vista Lawman, Moses Manners in Treatment: 17 Edema Assessment Assessed: [Left: No] [Right: No] Edema: [Left: N] [Right: o] Calf Left: Right: Point of Measurement: 37 cm From Medial Instep cm 31 cm Ankle Left: Right: Point of Measurement: 10 cm From Medial Instep cm 21 cm Electronic Signature(s) Signed: 02/08/2020 5:30:47 PM By: Carlene Coria RN Entered By: Carlene Coria on 02/08/2020 14:25:05 -------------------------------------------------------------------------------- Multi Wound Chart Details Patient Name: Date of Service: [name] C. 02/08/2020 2:00 PM Medical Record [id number] Patient Account Number: [id number] Date of Birth/Sex: Treating RN: December 14, 1935 (84 y.o. Elam Dutch Primary Care Shannelle Alguire: [name] Other Clinician: Referring Lorelee Mclaurin: Treating Taeden Geller/Extender:Robson, Vista Lawman, Moses Manners in Treatment: 17 Vital Signs Height(in): 72 Pulse(bpm): 50 Weight(lbs): 111 Blood Pressure(mmHg): 138/77 Body Mass Index(BMI): 23 Temperature(F): 98.0 Respiratory 18 Rate(breaths/min): Photos: [1:No Photos] [N/A:N/A] Wound Location: [1:Right Lower Leg - Posterior, N/A Distal] Wounding Event: [1:Trauma] [N/A:N/A] Primary Etiology: [1:Lymphedema] [N/A:N/A] Comorbid History: [1:Congestive Heart Failure, N/A Hypertension,  Received Radiation] Date Acquired: [1:09/08/2019] [N/A:N/A] Weeks of Treatment: [1:17] [N/A:N/A] Wound Status: [1:Open] [N/A:N/A] Measurements L x W x D 0.5x0.5x0.3 [N/A:N/A] (cm) Area (cm) : [1:0.196] [N/A:N/A] Volume (cm) : [1:0.059] [N/A:N/A] % Reduction in Area: [1:95.80%] [N/A:N/A] % Reduction in Volume: 87.50% [N/A:N/A] Classification: [1:Full Thickness Without Exposed Support Structures] [N/A:N/A] Exudate Amount: [1:Medium] [N/A:N/A] Exudate Type: [1:Serosanguineous] [N/A:N/A] Exudate Color: [1:red, brown] [N/A:N/A] Wound Margin: [1:Well defined, not attached N/A] Granulation Amount: [1:Medium (34-66%)] [N/A:N/A] Granulation Quality: [1:Pink] [N/A:N/A] Necrotic Amount: [1:Medium (34-66%)] [N/A:N/A] Exposed Structures: [1:Fat Layer (Subcutaneous Tissue) Exposed: Yes Fascia: No Tendon: No Muscle: No Joint: No Bone: No] [N/A:N/A] Epithelialization: [1:Medium (34-66%) Compression Therapy] [N/A:N/A N/A] Treatment Notes Wound #1 (Right, Distal,  Posterior Lower Leg) 1. Cleanse With Wound Cleanser Soap and water 2. Periwound Care Moisturizing lotion 3. Primary Dressing Applied Endoform 4. Secondary Dressing ABD Pad 6. Support Layer Applied 3 layer compression wrap Notes netting. patient declined the heel cup under compression wrap. Electronic Signature(s) Signed: 02/08/2020 5:31:36 PM By: Linton Ham MD Signed: 02/08/2020 5:31:53 PM By: Baruch Gouty RN, BSN Entered By: Linton Ham on 02/08/2020 15:20:54 -------------------------------------------------------------------------------- Multi-Disciplinary Care Plan Details Patient Name: Date of Service: [name] [name] 02/08/2020 2:00 PM Medical Record [id number] Patient Account Number: [id number] Date of Birth/Sex: Treating RN: 1935-12-20 (84 y.o. Elam Dutch Primary Care Jamiah Recore: [name] Other Clinician: Referring Kyron Schlitt: Treating Shyloh Derosa/Extender:Robson, Vista Lawman, Moses Manners in Treatment: 17 Active Inactive Wound/Skin Impairment Nursing Diagnoses: Impaired tissue integrity Knowledge deficit related to ulceration/compromised skin integrity Goals: Patient/caregiver will verbalize understanding of skin care regimen Date Initiated: 10/12/2019 Target Resolution Date: 03/04/2020 Goal Status: Active Ulcer/skin breakdown will have a volume reduction of 30% by week 4 Date Initiated: 10/12/2019 Date Inactivated: 11/09/2019 Target Resolution Date: 11/13/2019 Goal Status: Met Ulcer/skin breakdown will have a volume reduction of 50% by week 8 Date Initiated: 11/09/2019 Date Inactivated: 12/07/2019 Target Resolution Date: 12/11/2019 Goal Status: Met Ulcer/skin breakdown will have a volume reduction of 80% by week 12 Date Initiated: 12/07/2019 Date Inactivated: 01/04/2020 Target Resolution Date: 01/08/2020 Goal Status: Met Interventions: Assess patient/caregiver ability to obtain necessary supplies Assess patient/caregiver ability to  perform ulcer/skin care regimen upon admission and as needed Assess ulceration(s) every visit Provide education on ulcer and skin care Notes: Electronic Signature(s) Signed: 02/08/2020 5:31:53 PM By: Baruch Gouty RN, BSN Entered By: Baruch Gouty on 02/08/2020 14:52:58 -------------------------------------------------------------------------------- Pain Assessment Details Patient Name: Date of Service: [name] [name] 02/08/2020 2:00 PM Medical Record [id number] Patient Account Number: [id number] Date of Birth/Sex: Treating RN: Jun 10, 1936 (84 y.o. [name] Primary Care Daejah Klebba: [name] Other Clinician: Referring Davi Rotan: Treating Lashawnda Hancox/Extender:Robson, Vista Lawman, Moses Manners in Treatment: 17 Active Problems Location of Pain Severity and Description of Pain Patient Has Paino No Site Locations Pain Management and Medication Current Pain Management: Electronic Signature(s) Signed: 02/12/2020 5:28:43 PM By: Levan Hurst RN, BSN Signed: 02/24/2020 9:21:08 AM By: Sandre Kitty Entered By: Sandre Kitty on 02/08/2020 14:10:41 -------------------------------------------------------------------------------- Patient/Caregiver Education Details Patient Name: Date of Service: [name] 3/29/2021andnbsp2:00 PM Medical Record [phone] Patient Account Number: [id number] Date of Birth/Gender: Treating RN: [birth year] (84 y.o. Elam Dutch Primary Care Physician: [name] Other Clinician: Referring Physician: Treating Physician/Extender:Robson, Vista Lawman, Moses Manners in Treatment: 17 Education Assessment Education Provided To: Patient Education Topics Provided Venous: Methods: Explain/Verbal Responses: Reinforcements needed, State content correctly Wound/Skin Impairment: Methods: Explain/Verbal Responses: Reinforcements needed, State content correctly Electronic Signature(s) Signed: 02/08/2020 5:31:53 PM  By: Baruch Gouty RN, BSN Entered By: Johna Roles,  Linda on 02/08/2020 14:53:36 -------------------------------------------------------------------------------- Wound Assessment Details Patient Name: Date of Service: KETINA, MARS 02/08/2020 2:00 PM Medical Record [id number] Patient Account Number: [id number] Date of Birth/Sex: Treating RN: June 06, 1936 (84 y.o. [name] Primary Care Lexington Krotz: [name] Other Clinician: Referring Kamoria Lucien: Treating Ezriel Boffa/Extender:Robson, Vista Lawman, Moses Manners in Treatment: 17 Wound Status Wound Number: 1 Primary Lymphedema Etiology: Wound Location: Right Lower Leg - Posterior, Distal Wound Open Wounding Event: Trauma Status: Date Acquired: 09/08/2019 Comorbid Congestive Heart Failure, Hypertension, Weeks Of Treatment: 17 History: Received Radiation Clustered Wound: No Photos Photo Uploaded By: Mikeal Hawthorne on 02/15/2020 12:42:48 Wound Measurements Length: (cm) 0.5 % Reduc Width: (cm) 0.5 % Reduc Depth: (cm) 0.3 Epithel Area: (cm) 0.196 Tunnel Volume: (cm) 0.059 Underm Wound Description Classification: Full Thickness Without Exposed Support Foul Od Structures Slough/ Wound Well defined, not attached Margin: Exudate Medium Amount: Exudate Serosanguineous Type: Exudate red, brown Color: Wound Bed Granulation Amount: Medium (34-66%) Granulation Quality: Pink Fascia Necrotic Amount: Medium (34-66%) Fat Lay Necrotic Quality: Adherent Slough Tendon Muscle E Joint Ex Bone Exp or After Cleansing: No Fibrino Yes Exposed Structure Exposed: No er (Subcutaneous Tissue) Exposed: Yes Exposed: No xposed: No posed: No osed: No tion in Area: 95.8% tion in Volume: 87.5% ialization: Medium (34-66%) ing: No ining: No Electronic Signature(s) Signed: 02/08/2020 5:30:47 PM By: Carlene Coria RN Signed: 02/12/2020 5:28:43 PM By: Levan Hurst RN, BSN Entered By: Carlene Coria on 02/08/2020  14:25:22 -------------------------------------------------------------------------------- [place] Details Patient Name: Date of Service: [name] [name] 02/08/2020 2:00 PM Medical Record [id number] Patient Account Number: [id number] Date of Birth/Sex: Treating RN: July 14, 1936 (84 y.o. [name] Primary Care Jashayla Glatfelter: [name] Other Clinician: Referring Jernee Murtaugh: Treating Aleeah Greeno/Extender:Robson, Vista Lawman, Moses Manners in Treatment: 17 Vital Signs Time Taken: 14:08 Temperature (F): 98.0 Height (in): 58 Pulse (bpm): 62 Weight (lbs): 111 Respiratory Rate (breaths/min): 18 Body Mass Index (BMI): 23.2 Blood Pressure (mmHg): 138/77 Reference Range: 80 - 120 mg / dl Electronic Signature(s) Signed: 02/24/2020 9:21:08 AM By: Sandre Kitty Entered By: Sandre Kitty on 02/08/2020 14:10:38

## 2020-02-29 ENCOUNTER — Encounter (HOSPITAL_BASED_OUTPATIENT_CLINIC_OR_DEPARTMENT_OTHER): Payer: Medicare Other | Admitting: Internal Medicine

## 2020-02-29 ENCOUNTER — Other Ambulatory Visit: Payer: Self-pay

## 2020-02-29 DIAGNOSIS — I110 Hypertensive heart disease with heart failure: Secondary | ICD-10-CM | POA: Diagnosis not present

## 2020-02-29 DIAGNOSIS — L03115 Cellulitis of right lower limb: Secondary | ICD-10-CM | POA: Diagnosis not present

## 2020-02-29 DIAGNOSIS — I5032 Chronic diastolic (congestive) heart failure: Secondary | ICD-10-CM | POA: Diagnosis not present

## 2020-02-29 DIAGNOSIS — L97212 Non-pressure chronic ulcer of right calf with fat layer exposed: Secondary | ICD-10-CM | POA: Diagnosis not present

## 2020-02-29 DIAGNOSIS — I351 Nonrheumatic aortic (valve) insufficiency: Secondary | ICD-10-CM | POA: Diagnosis not present

## 2020-02-29 DIAGNOSIS — S8011XD Contusion of right lower leg, subsequent encounter: Secondary | ICD-10-CM | POA: Diagnosis not present

## 2020-02-29 DIAGNOSIS — S8011XA Contusion of right lower leg, initial encounter: Secondary | ICD-10-CM | POA: Diagnosis not present

## 2020-02-29 NOTE — Progress Notes (Signed)
[name], [name] ([id number]) Visit Report for 02/29/2020 Arrival Information Details Patient Name: Date of Service: [name], [name] 02/29/2020 12:45 PM Medical Record [id number] Patient Account Number: [id number] Date of Birth/Sex: Treating RN: 1936-03-01 (84 y.o. [name] Primary Care [name]: [name] Other Clinician: Referring [name]: Treating Ronda Rajkumar/Extender:Robson, Vista Lawman, Moses Manners in Treatment: 71 Visit Information History Since Last Visit Added or deleted any medications: No Patient Arrived: [name] Any new allergies or adverse reactions: No Arrival Time: 12:58 Had a fall or experienced change in No Accompanied By: self activities of daily living that may affect Transfer Assistance: None risk of falls: Patient Identification Verified: Yes Signs or symptoms of abuse/neglect since last No Secondary Verification Process Yes visito Completed: Hospitalized since last visit: No Patient Requires Transmission- No Implantable device outside of the clinic excluding No Based Precautions: cellular tissue based products placed in the center Patient Has Alerts: Yes since last visit: Patient Alerts: Patient on Blood Has Dressing in Place as Prescribed: Yes Thinner Has Compression in Place as Prescribed: Yes Pain Present Now: No Electronic Signature(s) Signed: 02/29/2020 5:36:36 PM By: Kela Millin Entered By: Kela Millin on 02/29/2020 12:58:41 -------------------------------------------------------------------------------- Clinic Level of Care Assessment Details Patient Name: Date of Service: [name] 02/29/2020 12:45 PM Medical Record Number:[id number] Patient Account Number: [id number] Date of Birth/Sex: Treating RN: May 27, 1936 (84 y.o. [name] Primary Care [name]: [name] Other Clinician: Referring [name]: Treating [name]/Extender:Robson, Vista Lawman, Moses Manners in Treatment:  20 Clinic Level of Care Assessment Items TOOL 4 Quantity Score X - Use when only an EandM is performed on FOLLOW-UP visit 1 0 ASSESSMENTS - Nursing Assessment / Reassessment X - Reassessment of Co-morbidities (includes updates in patient status) 1 10 X - Reassessment of Adherence to Treatment Plan 1 5 ASSESSMENTS - Wound and Skin Assessment / Reassessment X - Simple Wound Assessment / Reassessment - one wound 1 5 []  - Complex Wound Assessment / Reassessment - multiple wounds 0 []  - Dermatologic / Skin Assessment (not related to wound area) 0 ASSESSMENTS - Focused Assessment []  - Circumferential Edema Measurements - multi extremities 0 []  - Nutritional Assessment / Counseling / Intervention 0 X - Lower Extremity Assessment (monofilament, tuning fork, pulses) 1 5 []  - Peripheral Arterial Disease Assessment (using hand held doppler) 0 ASSESSMENTS - Ostomy and/or Continence Assessment and Care []  - Incontinence Assessment and Management 0 []  - Ostomy Care Assessment and Management (repouching, etc.) 0 PROCESS - Coordination of Care X - Simple Patient / Family Education for ongoing care 1 15 []  - Complex (extensive) Patient / Family Education for ongoing care 0 X - Staff obtains Programmer, systems, Records, Test Results / Process Orders 1 10 []  - Staff telephones HHA, Nursing Homes / Clarify orders / etc 0 []  - Routine Transfer to another Facility (non-emergent condition) 0 []  - Routine Hospital Admission (non-emergent condition) 0 []  - New Admissions / Biomedical engineer / Ordering NPWT, Apligraf, etc. 0 []  - Emergency Hospital Admission (emergent condition) 0 X - Simple Discharge Coordination 1 10 []  - Complex (extensive) Discharge Coordination 0 PROCESS - Special Needs []  - Pediatric / Minor Patient Management 0 []  - Isolation Patient Management 0 []  - Hearing / Language / Visual special needs 0 []  - Assessment of Community assistance (transportation, D/C planning, etc.) 0 []  -  Additional assistance / Altered mentation 0 []  - Support Surface(s) Assessment (bed, cushion, seat, etc.) 0 INTERVENTIONS - Wound Cleansing / Measurement X - Simple Wound Cleansing - one  wound 1 5 []  - Complex Wound Cleansing - multiple wounds 0 X - Wound Imaging (photographs - any number of wounds) 1 5 []  - Wound Tracing (instead of photographs) 0 X - Simple Wound Measurement - one wound 1 5 []  - Complex Wound Measurement - multiple wounds 0 INTERVENTIONS - Wound Dressings []  - Small Wound Dressing one or multiple wounds 0 []  - Medium Wound Dressing one or multiple wounds 0 []  - Large Wound Dressing one or multiple wounds 0 []  - Application of Medications - topical 0 []  - Application of Medications - injection 0 INTERVENTIONS - Miscellaneous []  - External ear exam 0 []  - Specimen Collection (cultures, biopsies, blood, body fluids, etc.) 0 []  - Specimen(s) / Culture(s) sent or taken to Lab for analysis 0 []  - Patient Transfer (multiple staff / Civil Service fast streamer / Similar devices) 0 []  - Simple Staple / Suture removal (25 or less) 0 []  - Complex Staple / Suture removal (26 or more) 0 []  - Hypo / Hyperglycemic Management (close monitor of Blood Glucose) 0 []  - Ankle / Brachial Index (ABI) - do not check if billed separately 0 X - Vital Signs 1 5 Has the patient been seen at the hospital within the last three years: Yes Total Score: 80 Level Of Care: New/Established - Level 3 Electronic Signature(s) Signed: 02/29/2020 6:02:31 PM By: Levan Hurst RN, BSN Entered By: Levan Hurst on 02/29/2020 13:27:48 -------------------------------------------------------------------------------- Encounter Discharge Information Details Patient Name: Date of Service: Annette Blinks [name] 02/29/2020 12:45 PM Medical Record [id number] Patient Account Number: [id number] Date of Birth/Sex: Treating RN: 01/01/1936 (84 y.o. [name] Primary Care [name]: [name] Other  Clinician: Referring Dalayla Aldredge: Treating Jesse Hirst/Extender:Robson, Vista Lawman, Moses Manners in Treatment: 20 Encounter Discharge Information Items Discharge Condition: Stable Ambulatory Status: Walker Discharge Destination: Home Transportation: Private Auto Accompanied By: self Schedule Follow-up Appointment: No Clinical Summary of Care: Notes educated and demonstrated how to apply compression stocking to right leg. padded right leg healed area as directed by MD. Electronic Signature(s) Signed: 02/29/2020 5:42:42 PM By: Deon Pilling Entered By: Deon Pilling on 02/29/2020 14:00:59 -------------------------------------------------------------------------------- Lower Extremity Assessment Details Patient Name: Date of Service: [name], [name] 02/29/2020 12:45 PM Medical Record [id number] Patient Account Number: [id number] Date of Birth/Sex: Treating RN: September 24, 1936 (84 y.o. [name] Primary Care Bina Veenstra: [name] Other Clinician: Referring Wandell Scullion: Treating Geordan Xu/Extender:Robson, Vista Lawman, Moses Manners in Treatment: 20 Edema Assessment Assessed: [Left: No] [Right: No] Edema: [Left: N] [Right: o] Calf Left: Right: Point of Measurement: 37 cm From Medial Instep cm 33 cm Ankle Left: Right: Point of Measurement: 10 cm From Medial Instep cm 20 cm Vascular Assessment Pulses: Dorsalis Pedis Palpable: [Right:Yes] Electronic Signature(s) Signed: 02/29/2020 5:36:36 PM By: Kela Millin Entered By: Kela Millin on 02/29/2020 13:02:50 -------------------------------------------------------------------------------- Multi Wound Chart Details Patient Name: Date of Service: Annette Mallet C. 02/29/2020 12:45 PM Medical Record [id number] Patient Account Number: [id number] Date of Birth/Sex: Treating RN: 26-Sep-1936 (84 y.o. [name] Primary Care Janay Canan: [name] Other Clinician: Referring Sharin Altidor: Treating  Calvert Charland/Extender:Robson, Vista Lawman, Moses Manners in Treatment: 20 Vital Signs Height(in): 58 Pulse(bpm): 22 Weight(lbs): 111 Blood Pressure(mmHg): 135/69 Body Mass Index(BMI): 23 Temperature(F): 98 Respiratory 18 Rate(breaths/min): Photos: [1:No Photos] [N/A:N/A] Wound Location: [1:Right, Distal, Posterior Lower Leg] [N/A:N/A] Wounding Event: [1:Trauma] [N/A:N/A] Primary Etiology: [1:Lymphedema] [N/A:N/A] Comorbid History: [1:Congestive Heart Failure, N/A Hypertension, Received Radiation] Date Acquired: [1:09/08/2019] [N/A:N/A] Weeks of Treatment: [1:20] [N/A:N/A] Wound Status: [1:Healed - Epithelialized] [N/A:N/A] Measurements L x W  x D 0x0x0 [N/A:N/A] (cm) Area (cm) : [1:0] [N/A:N/A] Volume (cm) : [1:0] [N/A:N/A] % Reduction in Area: [1:100.00%] [N/A:N/A] % Reduction in Volume: 100.00% [N/A:N/A] Classification: [1:Full Thickness Without Exposed Support Structures] [N/A:N/A] Exudate Amount: [1:None Present] [N/A:N/A] Wound Margin: [1:Distinct, outline attached N/A] Granulation Amount: [1:None Present (0%)] [N/A:N/A] Necrotic Amount: [1:None Present (0%)] [N/A:N/A] Exposed Structures: [1:Fascia: No Fat Layer (Subcutaneous Tissue) Exposed: No Tendon: No Muscle: No Joint: No Bone: No Large (67-100%)] [N/A:N/A N/A] Treatment Notes Electronic Signature(s) Signed: 02/29/2020 5:34:04 PM By: Linton Ham MD Signed: 02/29/2020 6:02:31 PM By: Levan Hurst RN, BSN Entered By: Linton Ham on 02/29/2020 13:24:05 -------------------------------------------------------------------------------- Multi-Disciplinary Care Plan Details Patient Name: Date of Service: Annette Blinks [name] 02/29/2020 12:45 PM Medical Record [id number] Patient Account Number: [id number] Date of Birth/Sex: Treating RN: 12-27-1935 (84 y.o. [name] Primary Care Sonam Wandel: [name] Other Clinician: Referring Elica Almas: Treating Crisanto Nied/Extender:Robson, Vista Lawman,  Moses Manners in Treatment: 20 Active Inactive Electronic Signature(s) Signed: 02/29/2020 6:02:31 PM By: Levan Hurst RN, BSN Entered By: Levan Hurst on 02/29/2020 13:26:32 -------------------------------------------------------------------------------- Pain Assessment Details Patient Name: Date of Service: [name]. 02/29/2020 12:45 PM Medical Record [id number] Patient Account Number: [id number] Date of Birth/Sex: Treating RN: 1936/02/14 (84 y.o. [name] Primary Care Irasema Chalk: [name] Other Clinician: Referring Anastazja Isaac: Treating Idelia Caudell/Extender:Robson, Vista Lawman, Moses Manners in Treatment: 20 Active Problems Location of Pain Severity and Description of Pain Patient Has Paino No Site Locations Pain Management and Medication Current Pain Management: Electronic Signature(s) Signed: 02/29/2020 5:36:36 PM By: Kela Millin Entered By: Kela Millin on 02/29/2020 13:00:08 -------------------------------------------------------------------------------- Patient/Caregiver Education Details Patient Name: Date of Service: [name] 4/19/2021andnbsp12:45 PM Medical Record Patient Account Number: [id number] Number: Treating RN: Levan Hurst Date of Birth/Gender: 06-14-1936 (84 y.o. F) Other Clinician: Primary Care Physician: Lyanne Co Referring Physician: Physician/Extender: Lavena Bullion in Treatment: 20 Education Assessment Education Provided To: Patient Education Topics Provided Wound/Skin Impairment: Methods: Explain/Verbal Responses: State content correctly [employer]) Signed: 02/29/2020 6:02:31 PM By: Levan Hurst RN, BSN Entered By: Levan Hurst on 02/29/2020 13:17:14 -------------------------------------------------------------------------------- Wound Assessment Details Patient Name: Date of Service: [name]. 02/29/2020 12:45  PM Medical Record [id number] Patient Account Number: [id number] Date of Birth/Sex: Treating RN: July 11, 1936 (84 y.o. [name] Primary Care Beckett Maden: [name] Other Clinician: Referring Knight Oelkers: Treating Macky Galik/Extender:Robson, Vista Lawman, Moses Manners in Treatment: 20 Wound Status Wound Number: 1 Primary Lymphedema Etiology: Wound Location: Right, Distal, Posterior Lower Leg Wound Healed - Epithelialized Wounding Event: Trauma Status: Date Acquired: 09/08/2019 Comorbid Congestive Heart Failure, Hypertension, Weeks Of Treatment: 20 History: Received Radiation Clustered Wound: No Wound Measurements Length: (cm) 0 % Reduct Width: (cm) 0 % Reduct Depth: (cm) 0 Epitheli Area: (cm) 0 Tunneli Volume: (cm) 0 Undermi Wound Description Full Thickness Without Exposed Support Foul Odo Classification: Structures Slough/F Wound Distinct, outline attached Margin: Exudate None Present Amount: Wound Bed Granulation Amount: None Present (0%) Necrotic Amount: None Present (0%) Fascia E Fat Laye Tendon E Muscle E Joint Ex Bone Exp r After Cleansing: No ibrino No Exposed Structure xposed: No r (Subcutaneous Tissue) Exposed: No xposed: No xposed: No posed: No osed: No ion in Area: 100% ion in Volume: 100% alization: Large (67-100%) ng: No ning: No Electronic Signature(s) Signed: 02/29/2020 5:36:36 PM By: Kela Millin Entered By: Kela Millin on 02/29/2020 13:08:58 -------------------------------------------------------------------------------- Vitals Details Patient Name: Date of Service: Annette Mallet C. 02/29/2020 12:45 PM Medical Record [id number] Patient Account Number: [id number]  Date of Birth/Sex: Treating RN: 1936/09/03 (84 y.o. [name] Primary Care Valinda Fedie: [name] Other Clinician: Referring Laurissa Cowper: Treating Dalbert Stillings/Extender:Robson, Vista Lawman, Moses Manners in Treatment: 20 Vital  Signs Time Taken: 12:58 Temperature (F): 98 Height (in): 58 Pulse (bpm): 67 Weight (lbs): 111 Respiratory Rate (breaths/min): 18 Body Mass Index (BMI): 23.2 Blood Pressure (mmHg): 135/69 Reference Range: 80 - 120 mg / dl Electronic Signature(s) Signed: 02/29/2020 5:36:36 PM By: Kela Millin Entered By: Kela Millin on 02/29/2020 12:59:59
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
## 2020-03-15 ENCOUNTER — Other Ambulatory Visit: Payer: Self-pay | Admitting: Cardiology

## 2020-03-24 DIAGNOSIS — N814 Uterovaginal prolapse, unspecified: Secondary | ICD-10-CM | POA: Diagnosis not present

## 2020-03-24 DIAGNOSIS — N8111 Cystocele, midline: Secondary | ICD-10-CM | POA: Diagnosis not present

## 2020-04-05 ENCOUNTER — Other Ambulatory Visit: Payer: Self-pay | Admitting: Cardiology

## 2020-04-05 NOTE — Telephone Encounter (Signed)
Age 84, weight 50kg, SCr 0.81 at [facility] on 01/08/20, last OV March 2021, afib indication

## 2020-04-19 ENCOUNTER — Encounter (HOSPITAL_BASED_OUTPATIENT_CLINIC_OR_DEPARTMENT_OTHER): Payer: Medicare Other | Attending: Internal Medicine | Admitting: Internal Medicine

## 2020-04-19 DIAGNOSIS — I87331 Chronic venous hypertension (idiopathic) with ulcer and inflammation of right lower extremity: Secondary | ICD-10-CM | POA: Insufficient documentation

## 2020-04-19 DIAGNOSIS — Z8249 Family history of ischemic heart disease and other diseases of the circulatory system: Secondary | ICD-10-CM | POA: Insufficient documentation

## 2020-04-19 DIAGNOSIS — L97919 Non-pressure chronic ulcer of unspecified part of right lower leg with unspecified severity: Secondary | ICD-10-CM | POA: Insufficient documentation

## 2020-04-19 DIAGNOSIS — I5032 Chronic diastolic (congestive) heart failure: Secondary | ICD-10-CM | POA: Insufficient documentation

## 2020-04-19 DIAGNOSIS — I872 Venous insufficiency (chronic) (peripheral): Secondary | ICD-10-CM | POA: Insufficient documentation

## 2020-04-19 DIAGNOSIS — I110 Hypertensive heart disease with heart failure: Secondary | ICD-10-CM | POA: Diagnosis not present

## 2020-04-19 DIAGNOSIS — Z880 Allergy status to penicillin: Secondary | ICD-10-CM | POA: Insufficient documentation

## 2020-04-19 DIAGNOSIS — Z7901 Long term (current) use of anticoagulants: Secondary | ICD-10-CM | POA: Insufficient documentation

## 2020-04-19 DIAGNOSIS — Z853 Personal history of malignant neoplasm of breast: Secondary | ICD-10-CM | POA: Diagnosis not present

## 2020-04-19 DIAGNOSIS — I4891 Unspecified atrial fibrillation: Secondary | ICD-10-CM | POA: Insufficient documentation

## 2020-04-19 DIAGNOSIS — Z85828 Personal history of other malignant neoplasm of skin: Secondary | ICD-10-CM | POA: Diagnosis not present

## 2020-04-19 DIAGNOSIS — L97212 Non-pressure chronic ulcer of right calf with fat layer exposed: Secondary | ICD-10-CM | POA: Diagnosis not present
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
## 2020-04-21 NOTE — Progress Notes (Signed)
[name], [name] ([id number]) Visit Report for 04/19/2020 Abuse/Suicide Risk Screen Details Patient Name: Date of Service: [place], [place] [name] 04/19/2020 2:45 PM Medical Record Number: [id number] Patient Account Number: [id number] Date of Birth/Sex: Treating RN: Nov 26, 1935 (84 y.o. [name] Primary Care Talulah Schirmer: [name] Other Clinician: Referring Camdon Saetern: Treating Alford Gamero/Extender: Herma Ard, Moses Manners in Treatment: 0 Abuse/Suicide Risk Screen Items Answer ABUSE RISK SCREEN: Has anyone close to you tried to hurt or harm you recentlyo No Do you feel uncomfortable with anyone in your familyo No Has anyone forced you do things that you didnt want to doo No Electronic Signature(s) Signed: 04/21/2020 5:18:16 PM By: Levan Hurst RN, BSN Entered By: Levan Hurst on 04/19/2020 15:24:26 -------------------------------------------------------------------------------- Activities of Daily Living Details Patient Name: Date of Service: [place], [place] [name] 04/19/2020 2:45 PM Medical Record Number: [id number] Patient Account Number: [id number] Date of Birth/Sex: Treating RN: [birth year] (84 y.o. [name] Primary Care Brynley Cuddeback: [name] Other Clinician: Referring Buford Bremer: Treating Jung Ingerson/Extender: Herma Ard, Moses Manners in Treatment: 0 Activities of Daily Living Items Answer Activities of Daily Living (Please select one for each item) Drive Automobile Completely Able T Medications ake Completely Able Use T elephone Completely Able Care for Appearance Completely Able Use T oilet Completely Able Bath / Shower Completely Able Dress Self Completely Able Feed Self Completely Able Walk Completely Able Get In / Out Bed Completely Able Housework Completely Able Prepare Meals Completely Able Handle Money Completely Able Shop for Self Completely Able Electronic Signature(s) Signed: 04/21/2020 5:18:16 PM By: Levan Hurst RN,  BSN Entered By: Levan Hurst on 04/19/2020 15:24:58 -------------------------------------------------------------------------------- Education Screening Details Patient Name: Date of Service: [place] [name] 04/19/2020 2:45 PM Medical Record Number: [id number] Patient Account Number: [id number] Date of Birth/Sex: Treating RN: 07-15-1936 (84 y.o. [name] Primary Care Ayodele Hartsock: [name] Other Clinician: Referring Kaytlen Lightsey: Treating Amelita Risinger/Extender: Louis Meckel in Treatment: 0 Primary Learner Assessed: Patient Learning Preferences/Education Level/Primary Language Learning Preference: Explanation, Demonstration, Printed Material Highest Education Level: College or Above Preferred Language: English Cognitive Barrier Language Barrier: No Translator Needed: No Memory Deficit: No Emotional Barrier: No Cultural/Religious Beliefs Affecting Medical Care: No Physical Barrier Impaired Vision: No Impaired Hearing: No Decreased Hand dexterity: No Knowledge/Comprehension Knowledge Level: High Comprehension Level: High Ability to understand written instructions: High Ability to understand verbal instructions: High Motivation Anxiety Level: Calm Cooperation: Cooperative Education Importance: Acknowledges Need Interest in Health Problems: Asks Questions Perception: Coherent Willingness to Engage in Self-Management High Activities: Readiness to Engage in Self-Management High Activities: Electronic Signature(s) Signed: 04/21/2020 5:18:16 PM By: Levan Hurst RN, BSN Entered By: Levan Hurst on 04/19/2020 15:25:39 -------------------------------------------------------------------------------- Fall Risk Assessment Details Patient Name: Date of Service: [place] [name] 04/19/2020 2:45 PM Medical Record Number: [id number] Patient Account Number: [id number] Date of Birth/Sex: Treating RN: 01-28-1936 (84 y.o. [name] Primary Care Kerney Hopfensperger: [name] Other Clinician: Referring Nyomie Ehrlich: Treating Giannamarie Paulus/Extender: Herma Ard, Moses Manners in Treatment: 0 Fall Risk Assessment Items Have you had 2 or more falls in the last 12 monthso 0 No Have you had any fall that resulted in injury in the last 12 monthso 0 No FALLS RISK SCREEN History of falling - immediate or within 3 months 0 No Secondary diagnosis (Do you have 2 or more medical diagnoseso) 15 Yes Ambulatory aid None/bed rest/wheelchair/nurse 0 No Crutches/cane/walker 15 Yes Furniture 0 No Intravenous therapy Access/Saline/Heparin Lock 0 No Gait/Transferring  Normal/ bed rest/ wheelchair 0 Yes Weak (short steps with or without shuffle, stooped but able to lift head while walking, may seek 0 No support from furniture) Impaired (short steps with shuffle, may have difficulty arising from chair, head down, impaired 0 No balance) Mental Status Oriented to own ability 0 Yes Electronic Signature(s) Signed: 04/21/2020 5:18:16 PM By: Levan Hurst RN, BSN Entered By: Levan Hurst on 04/19/2020 15:26:38 -------------------------------------------------------------------------------- Nutrition Risk Screening Details Patient Name: Date of Service: [place] [name] 04/19/2020 2:45 PM Medical Record Number: [id number] Patient Account Number: [id number] Date of Birth/Sex: Treating RN: Feb 25, 1936 (84 y.o. [name] Primary Care Tia Gelb: [name] Other Clinician: Referring Jhovani Griswold: Treating Montine Hight/Extender: Herma Ard, Moses Manners in Treatment: 0 Height (in): Weight (lbs): Body Mass Index (BMI): Nutrition Risk Screening Items Score Screening NUTRITION RISK SCREEN: I have an illness or condition that made me change the kind and/or amount of food I eat 0 No I eat fewer than two meals per day 0 No I eat few fruits and vegetables, or milk products 0 No I have three or more drinks of beer,  liquor or wine almost every day 0 No I have tooth or mouth problems that make it hard for me to eat 0 No I don't always have enough money to buy the food I need 0 No I eat alone most of the time 0 No I take three or more different prescribed or over-the-counter drugs a day 1 Yes Without wanting to, I have lost or gained 10 pounds in the last six months 0 No I am not always physically able to shop, cook and/or feed myself 0 No Nutrition Protocols Good Risk Protocol Moderate Risk Protocol 0 Provide education on nutrition High Risk Proctocol Risk Level: Good Risk Score: 1 Electronic Signature(s) Signed: 04/21/2020 5:18:16 PM By: Levan Hurst RN, BSN Entered By: Levan Hurst on 04/19/2020 15:26:55

## 2020-04-21 NOTE — Progress Notes (Signed)
[name] ([id number]) Visit Report for 04/19/2020 Allergy List Details Patient Name: Date of Service: [place], [place] MA [name] 04/19/2020 2:45 PM Medical Record Number: [id number] Patient Account Number: [id number] Date of Birth/Sex: Treating RN: [birth year] (84 y.o. [name] Primary Care Yatziry Deakins: [name] Other Clinician: Referring Braelin Costlow: Treating Chrishun Scheer/Extender: Herma Ard, Moses Manners in Treatment: 0 Allergies Active Allergies Iodinated Contrast Media Severity: Moderate Mucinex Zanaflex Severity: Moderate penicillin Severity: Moderate Allergy Notes Electronic Signature(s) Signed: 04/21/2020 5:18:16 PM By: Levan Hurst RN, BSN Entered By: Levan Hurst on 04/19/2020 15:23:18 -------------------------------------------------------------------------------- Arrival Information Details Patient Name: Date of Service: [place] [name] 04/19/2020 2:45 PM Medical Record Number: [id number] Patient Account Number: [id number] Date of Birth/Sex: Treating RN: [birth year] (84 y.o. [name] Primary Care Elonzo Sopp: [name] Other Clinician: Referring Sheilyn Boehlke: Treating Jakeline Dave/Extender: Herma Ard, Moses Manners in Treatment: 0 Visit Information Patient Arrived: Charlyn Minerva Time: 15:21 Accompanied By: alone Transfer Assistance: None Patient Identification Verified: Yes Secondary Verification Process Completed: Yes Patient Requires Transmission-Based Precautions: No Patient Has Alerts: No History Since Last Visit Added or deleted any medications: No Any new allergies or adverse reactions: No Had a fall or experienced change in activities of daily living that may affect risk of falls: No Signs or symptoms of abuse/neglect since last visito No Hospitalized since last visit: No Implantable device outside of the clinic excluding cellular tissue based products placed in the center since last visit: No Pain Present Now:  No Electronic Signature(s) Signed: 04/21/2020 5:18:16 PM By: Levan Hurst RN, BSN Entered By: Levan Hurst on 04/19/2020 15:22:26 -------------------------------------------------------------------------------- Clinic Level of Care Assessment Details Patient Name: Date of Service: [place], [place] MA [name] 04/19/2020 2:45 PM Medical Record Number: [id number] Patient Account Number: [id number] Date of Birth/Sex: Treating RN: Aug 10, 1936 (84 y.o. Orvan Falconer Primary Care Suzzane Quilter: [name] Other Clinician: Referring Tarisa Paola: Treating Avital Dancy/Extender: Louis Meckel in Treatment: 0 Clinic Level of Care Assessment Items TOOL 2 Quantity Score X- 1 0 Use when only an EandM is performed on the INITIAL visit ASSESSMENTS - Nursing Assessment / Reassessment X- 1 20 General Physical Exam (combine w/ comprehensive assessment (listed just below) when performed on new pt. evals) X- 1 25 Comprehensive Assessment (HX, ROS, Risk Assessments, Wounds Hx, etc.) ASSESSMENTS - Wound and Skin A ssessment / Reassessment X - Simple Wound Assessment / Reassessment - one wound 1 5 []  - 0 Complex Wound Assessment / Reassessment - multiple wounds []  - 0 Dermatologic / Skin Assessment (not related to wound area) ASSESSMENTS - Ostomy and/or Continence Assessment and Care []  - 0 Incontinence Assessment and Management []  - 0 Ostomy Care Assessment and Management (repouching, etc.) PROCESS - Coordination of Care X - Simple Patient / Family Education for ongoing care 1 15 []  - 0 Complex (extensive) Patient / Family Education for ongoing care X- 1 10 Staff obtains Programmer, systems, Records, T Results / Process Orders est []  - 0 Staff telephones HHA, Nursing Homes / Clarify orders / etc []  - 0 Routine Transfer to another Facility (non-emergent condition) []  - 0 Routine Hospital Admission (non-emergent condition) X- 1 15 New Admissions / Biomedical engineer / Ordering NPWT  Apligraf, etc. , []  - 0 Emergency Hospital Admission (emergent condition) X- 1 10 Simple Discharge Coordination []  - 0 Complex (extensive) Discharge Coordination PROCESS - Special Needs []  - 0 Pediatric / Minor Patient Management []  - 0 Isolation Patient Management []  - 0 Hearing / Language /  Visual special needs []  - 0 Assessment of Community assistance (transportation, D/C planning, etc.) []  - 0 Additional assistance / Altered mentation []  - 0 Support Surface(s) Assessment (bed, cushion, seat, etc.) INTERVENTIONS - Wound Cleansing / Measurement []  - 0 Wound Imaging (photographs - any number of wounds) []  - 0 Wound Tracing (instead of photographs) []  - 0 Simple Wound Measurement - one wound []  - 0 Complex Wound Measurement - multiple wounds []  - 0 Simple Wound Cleansing - one wound []  - 0 Complex Wound Cleansing - multiple wounds INTERVENTIONS - Wound Dressings []  - 0 Small Wound Dressing one or multiple wounds []  - 0 Medium Wound Dressing one or multiple wounds []  - 0 Large Wound Dressing one or multiple wounds []  - 0 Application of Medications - injection INTERVENTIONS - Miscellaneous []  - 0 External ear exam []  - 0 Specimen Collection (cultures, biopsies, blood, body fluids, etc.) []  - 0 Specimen(s) / Culture(s) sent or taken to Lab for analysis []  - 0 Patient Transfer (multiple staff / Civil Service fast streamer / Similar devices) []  - 0 Simple Staple / Suture removal (25 or less) []  - 0 Complex Staple / Suture removal (26 or more) []  - 0 Hypo / Hyperglycemic Management (close monitor of Blood Glucose) X- 1 15 Ankle / Brachial Index (ABI) - do not check if billed separately Has the patient been seen at the hospital within the last three years: Yes Total Score: 115 Level Of Care: New/Established - Level 3 Electronic Signature(s) Signed: 04/19/2020 4:34:33 PM By: Carlene Coria RN Entered By: Carlene Coria on 04/19/2020  15:55:28 -------------------------------------------------------------------------------- Encounter Discharge Information Details Patient Name: Date of Service: [place], [place] [name] 04/19/2020 2:45 PM Medical Record Number: [id number] Patient Account Number: [id number] Date of Birth/Sex: Treating RN: Feb 24, 1936 (84 y.o. Orvan Falconer Primary Care Allizon Woznick: [name] Other Clinician: Referring Madoc Holquin: Treating Delfin Squillace/Extender: Louis Meckel in Treatment: 0 Encounter Discharge Information Items Discharge Condition: Stable Ambulatory Status: Walker Discharge Destination: Home Transportation: Private Auto Accompanied By: self Schedule Follow-up Appointment: Yes Clinical Summary of Care: Patient Declined Electronic Signature(s) Signed: 04/19/2020 4:34:33 PM By: Carlene Coria RN Entered By: Carlene Coria on 04/19/2020 16:33:51 -------------------------------------------------------------------------------- [place] Details Patient Name: Date of Service: [place], [place] MA [name] 04/19/2020 2:45 PM Medical Record Number: [id number] Patient Account Number: [id number] Date of Birth/Sex: Treating RN: [birth year] (84 y.o. Orvan Falconer Primary Care Sarena Jezek: [name] Other Clinician: Referring Schneur Crowson: Treating Elleen Coulibaly/Extender: Herma Ard, Moses Manners in Treatment: 0 Active Inactive Electronic Signature(s) Signed: 04/19/2020 4:34:33 PM By: Carlene Coria RN Entered By: Carlene Coria on 04/19/2020 15:53:55 -------------------------------------------------------------------------------- Pain Assessment Details Patient Name: Date of Service: [place], [place] MA [name] 04/19/2020 2:45 PM Medical Record Number: [id number] Patient Account Number: [id number] Date of Birth/Sex: Treating RN: [birth year] (84 y.o. [name] Primary Care Arrick Dutton: [name] Other Clinician: Referring Kellye Mizner: Treating Jalynn Betzold/Extender:  Herma Ard, Moses Manners in Treatment: 0 Active Problems Location of Pain Severity and Description of Pain Patient Has Paino No Site Locations Pain Management and Medication Current Pain Management: Electronic Signature(s) Signed: 04/21/2020 5:18:16 PM By: Levan Hurst RN, BSN Entered By: Levan Hurst on 04/19/2020 15:27:24 -------------------------------------------------------------------------------- Patient/Caregiver Education Details Patient Name: Date of Service: [place]ul[place] MA [name] 6/8/2021andnbsp2:45 PM Medical Record Number: [id number] Patient Account Number: [id number] Date of Birth/Gender: Treating RN: 05/15/1936 (84 y.o. Orvan Falconer Primary Care Physician: [name] Other Clinician: Referring Physician: Treating Physician/Extender: Herma Ard, Jenny Reichmann  Weeks in Treatment: 0 Education Assessment Education Provided To: Patient Education Topics Provided Wound/Skin Impairment: Methods: Explain/Verbal Responses: State content correctly Electronic Signature(s) Signed: 04/19/2020 4:34:33 PM By: Carlene Coria RN Entered By: Carlene Coria on 04/19/2020 15:54:12 -------------------------------------------------------------------------------- [place] Details Patient Name: Date of Service: [place], [place] [name] 04/19/2020 2:45 PM Medical Record Number: [id number] Patient Account Number: [id number] Date of Birth/Sex: Treating RN: [birth year] (85 y.o. [name] Primary Care Justyce Yeater: [name] Other Clinician: Referring Icelyn Navarrete: Treating Galadriel Shroff/Extender: Herma Ard, Moses Manners in Treatment: 0 Vital Signs Time Taken: 15:22 Temperature (F): 98.2 Pulse (bpm): 71 Respiratory Rate (breaths/min): 16 Blood Pressure (mmHg): 136/82 Reference Range: 80 - 120 mg / dl Electronic Signature(s) Signed: 04/21/2020 5:18:16 PM By: Levan Hurst RN, BSN Entered By: Levan Hurst on 04/19/2020 15:23:07

## 2020-04-27 ENCOUNTER — Ambulatory Visit: Payer: Medicare Other | Admitting: Cardiothoracic Surgery

## 2020-05-03 ENCOUNTER — Other Ambulatory Visit: Payer: Self-pay | Admitting: Cardiothoracic Surgery

## 2020-05-03 DIAGNOSIS — I7121 Aneurysm of the ascending aorta, without rupture: Secondary | ICD-10-CM

## 2020-05-04 ENCOUNTER — Ambulatory Visit
Admission: RE | Admit: 2020-05-04 | Discharge: 2020-05-04 | Disposition: A | Discharge status: Home / Self Care | Payer: Medicare Other | Source: Ambulatory Visit | Attending: Cardiothoracic Surgery | Admitting: Cardiothoracic Surgery

## 2020-05-04 ENCOUNTER — Encounter: Payer: Self-pay | Admitting: Cardiothoracic Surgery

## 2020-05-04 ENCOUNTER — Ambulatory Visit (INDEPENDENT_AMBULATORY_CARE_PROVIDER_SITE_OTHER): Payer: Medicare Other | Admitting: Cardiothoracic Surgery

## 2020-05-04 ENCOUNTER — Other Ambulatory Visit: Payer: Self-pay

## 2020-05-04 VITALS — BP 145/77 | HR 62 | Temp 97.7°F | Resp 16 | Ht 59.0 in | Wt 111.0 lb

## 2020-05-04 DIAGNOSIS — I700 Atherosclerosis of aorta: Secondary | ICD-10-CM | POA: Diagnosis not present

## 2020-05-04 DIAGNOSIS — I7121 Aneurysm of the ascending aorta, without rupture: Secondary | ICD-10-CM

## 2020-05-04 DIAGNOSIS — I712 Thoracic aortic aneurysm, without rupture: Secondary | ICD-10-CM

## 2020-05-04 NOTE — Progress Notes (Signed)
PCP is [name], MD Referring Provider is [name], MD  Chief Complaint  Patient presents with  . Thoracic Aortic Aneurysm    yearly f/u with CXR today    HPI:HPI: Patient presents for one-year review for long history of moderate thoracic aortic fusiform aneurysm disease.  She has a stable 4.2 cm aneurysm from history of high blood pressure.  It is asymptomatic.  Because she is now 84 years old and has severe scoliosis with restrictive lung disease and is not an operative candidate we stop doing annual CT scans of the chest.  Last chest CT scan showed a diameter 4.3 cm in 2018.  In 2019 she had an echocardiogram which shows mild mitral prolapse and regurgitation with aortic diameter of 4.2 cm. Today she returns with a chest x-ray which shows no significant change in her aortic shadow.  There is no evidence of pleural effusion or CHF.   Patient denies chest pain She denies palpitations dizziness or presyncope.  She has mild periodic ankle edema for which she takes as needed Lasix.      Past Medical History:  Diagnosis Date  . Anxiety   . Aortic regurgitation   . Breast cancer ([place]) 08/20/2011   R breast DCIS, ER/PR +  . Cataract 3 and 10/92   bilateral  . Chronic diastolic CHF (congestive heart failure) ([place])   . Compression fracture of fourth lumbar vertebra ([facility])   . DVT (deep venous thrombosis) ([place]) 08/2011   LL extremity   . Fibromyalgia   . Fracture lumbar vertebra-closed ([place])   . Fracture of thoracic vertebra, closed ([place])   . GERD (gastroesophageal reflux disease)   . H/O hiatal hernia   . History of blood clots   . History of radiation therapy 01/2012   R breast  . Hypercholesteremia   . Hypertension    DR Orinda Kenner  . Hypothyroidism   . Mitral regurgitation   . Mitral valve prolapse   . Osteoporosis   . PAF (paroxysmal atrial fibrillation) ([place])    a. dx 11/2016.  . Pulmonary hypertension ([place])   . Rib fractures   . Thoracic ascending aortic aneurysm  ([place])    a. followed by Dr. Prescott Gum.  . Tricuspid regurgitation     Past Surgical History:  Procedure Laterality Date  . ANTERIOR CERVICAL DECOMP/DISCECTOMY FUSION N/A 09/09/2015   Procedure: Anterior Cervical Decompression and Fusion Cervical seven-Thorasic one ;  Surgeon: Erline Levine, MD;  Location: [facility];  Service: Neurosurgery;  Laterality: N/A;  . APPENDECTOMY  1940  . BREAST SURGERY    . CATARACT EXTRACTION  1992  . EYE SURGERY  1940, 1956  . HERNIA REPAIR  10/16/2006   [facility] - Dr Hassell Done  . KYPHOPLASTY N/A 01/26/2013   Procedure: KYPHOPLASTY;  Surgeon: Kristeen Miss, MD;  Location: [facility];  Service: Neurosurgery;  Laterality: N/A;  T11 and L1  . KYPHOPLASTY N/A 06/21/2017   Procedure: Lumbar four Kyphoplasty;  Surgeon: Erline Levine, MD;  Location: [facility];  Service: Neurosurgery;  Laterality: N/A;  . MASTECTOMY, PARTIAL  10/17/2011   Procedure: MASTECTOMY PARTIAL;  Surgeon: Haywood Lasso, MD;  Location: [facility];  Service: General;  Laterality: Right;  needle guided  . TEE WITHOUT CARDIOVERSION N/A 12/04/2016   Procedure: TRANSESOPHAGEAL ECHOCARDIOGRAM (TEE);  Surgeon: Pixie Casino, MD;  Location: [facility];  Service: Cardiovascular;  Laterality: N/A;  . TONSILLECTOMY  1944    Family History  Problem Relation Age of Onset  .  Heart disease Mother   . Heart disease Maternal Uncle   . Heart disease Maternal Grandfather     Social History Social History   Tobacco Use  . Smoking status: Never Smoker  . Smokeless tobacco: Never Used  Vaping Use  . Vaping Use: Never used  Substance Use Topics  . Alcohol use: No  . Drug use: No    Current Outpatient Medications  Medication Sig Dispense Refill  . BONIVA 150 MG tablet Take 150 mg by mouth every 30 (thirty) days.     . calcium citrate-vitamin D (CITRACAL+D) 315-200 MG-UNIT per tablet Take 1 tablet by mouth 2 (two) times daily. (Patient taking differently: Take 1 tablet by mouth 2 (two) times daily. With  breakfast & with supper) 60 tablet 11  . Camphor-Eucalyptus-Menthol (VICKS VAPORUB EX) Apply 1 application topically daily as needed (toenail fungus).     . cholecalciferol (VITAMIN D) 1000 UNITS tablet Take 1,000 Units by mouth daily with lunch. Vitamin D3    . conjugated estrogens (PREMARIN) vaginal cream Place 1 Applicatorful vaginally 2 (two) times a week.    . diltiazem (CARDIZEM CD) 180 MG 24 hr capsule TAKE (1) CAPSULE DAILY. 90 capsule 2  . ELIQUIS 2.5 MG TABS tablet TAKE 1 TABLET BY MOUTH TWICE DAILY. 60 tablet 5  . flecainide (TAMBOCOR) 50 MG tablet TAKE 1 TABLET BY MOUTH TWICE DAILY. 180 tablet 3  . furosemide (LASIX) 20 MG tablet Take 20 mg by mouth daily.    Marland Kitchen HYDROcodone-acetaminophen (NORCO/VICODIN) 5-325 MG tablet Take 1-2 tablets by mouth every 4 (four) hours as needed (mild pain). (Patient taking differently: Take 1-2 tablets by mouth every 4 (four) hours as needed (for back pain.). ) 30 tablet 0  . iron polysaccharides (NIFEREX) 150 MG capsule Take 150 mg by mouth 3 (three) times a week.     . levothyroxine (SYNTHROID, LEVOTHROID) 125 MCG tablet Take 125 mcg by mouth daily before breakfast.    . losartan (COZAAR) 25 MG tablet TAKE 1 TABLET ONCE DAILY. 90 tablet 3  . Multiple Vitamin (MULTIVITAMIN) tablet Take 1 tablet by mouth daily with breakfast.     . omeprazole (PRILOSEC OTC) 20 MG tablet Take 20 mg by mouth daily before breakfast.     . potassium chloride (KLOR-CON) 10 MEQ tablet Take 10 mEq by mouth daily.    . pravastatin (PRAVACHOL) 20 MG tablet Take 20 mg by mouth daily with supper.     . solifenacin (VESICARE) 5 MG tablet Take 5 mg by mouth daily with breakfast.      No current facility-administered medications for this visit.    Allergies  Allergen Reactions  . Contrast Media [Iodinated Diagnostic Agents] Shortness Of Breath  . Iohexol Shortness Of Breath  . Mucinex [Guaifenesin Er] Other (See Comments)    DIZZINESS  . Zanaflex [Tizanidine Hcl] Anxiety and  Other (See Comments)    Dizziness  . Penicillins Rash    Has patient had a PCN reaction causing immediate rash, facial/tongue/throat swelling, SOB or lightheadedness with hypotension: No Has patient had a PCN reaction causing severe rash involving mucus membranes or skin necrosis: No Has patient had a PCN reaction that required hospitalization No Has patient had a PCN reaction occurring within the last 10 years: No If all of the above answers are "NO", then may proceed with Cephalosporin use.   . Robaxin [Methocarbamol] Diarrhea    Review of Systems  No change in weight She received the Covid vaccine No recent hospitalizations No  recent falls or procedures No bleeding complications from Eliquis Recently finished treatment of wound center for a right tibial laceration of the skin and subcu tissue  BP (!) 145/77 (BP Location: Left Arm, Patient Position: Sitting, Cuff Size: Normal)   Pulse 62   Temp 97.7 F (36.5 C)   Resp 16   Ht 4\' 11"  (1.499 m)   Wt 111 lb (50.3 kg)   SpO2 98% Comment: RA  BMI 22.42 kg/m  Physical Exam      Exam    General- alert and comfortable    Neck- no JVD, no cervical adenopathy palpable, no carotid bruit   Lungs- clear without rales, wheezes   Cor- regular rate and rhythm, no murmur , gallop   Abdomen- soft, non-tender   Extremities - warm, non-tender, minimal edema   Neuro- oriented, appropriate, no focal weakness   Diagnostic Tests: Chest x-ray taken today personally reviewed Shows chronic severe scoliosis kyphosis and mild dilatation of the aortic shadow without pleural effusion or edema  Impression: Stable asymptomatic thoracic ascending aneurysm.  Patient not candidate for surgery under any condition due to her age and comorbid problems.  She wishes to be followed in our office on an annual basis to help her with prognosis of her thoracic aortic disease.  Plan: She will return with chest x-ray in 1 year.  Len Childs, MD [facility] [phone]

## 2020-05-24 DIAGNOSIS — N814 Uterovaginal prolapse, unspecified: Secondary | ICD-10-CM | POA: Diagnosis not present

## 2020-05-24 DIAGNOSIS — N8111 Cystocele, midline: Secondary | ICD-10-CM | POA: Diagnosis not present

## 2020-05-24 DIAGNOSIS — L292 Pruritus vulvae: Secondary | ICD-10-CM | POA: Diagnosis not present

## 2020-08-04 DIAGNOSIS — H26493 Other secondary cataract, bilateral: Secondary | ICD-10-CM | POA: Diagnosis not present

## 2020-08-04 DIAGNOSIS — H524 Presbyopia: Secondary | ICD-10-CM | POA: Diagnosis not present

## 2020-08-04 DIAGNOSIS — H52201 Unspecified astigmatism, right eye: Secondary | ICD-10-CM | POA: Diagnosis not present

## 2020-08-04 DIAGNOSIS — Z961 Presence of intraocular lens: Secondary | ICD-10-CM | POA: Diagnosis not present

## 2020-08-12 DIAGNOSIS — K219 Gastro-esophageal reflux disease without esophagitis: Secondary | ICD-10-CM | POA: Diagnosis not present

## 2020-08-12 DIAGNOSIS — I712 Thoracic aortic aneurysm, without rupture: Secondary | ICD-10-CM | POA: Diagnosis not present

## 2020-08-12 DIAGNOSIS — I480 Paroxysmal atrial fibrillation: Secondary | ICD-10-CM | POA: Diagnosis not present

## 2020-08-12 DIAGNOSIS — I10 Essential (primary) hypertension: Secondary | ICD-10-CM | POA: Diagnosis not present

## 2020-08-12 DIAGNOSIS — M1990 Unspecified osteoarthritis, unspecified site: Secondary | ICD-10-CM | POA: Diagnosis not present

## 2020-08-12 DIAGNOSIS — E039 Hypothyroidism, unspecified: Secondary | ICD-10-CM | POA: Diagnosis not present

## 2020-08-12 DIAGNOSIS — D509 Iron deficiency anemia, unspecified: Secondary | ICD-10-CM | POA: Diagnosis not present

## 2020-08-12 DIAGNOSIS — E782 Mixed hyperlipidemia: Secondary | ICD-10-CM | POA: Diagnosis not present

## 2020-08-12 DIAGNOSIS — Z23 Encounter for immunization: Secondary | ICD-10-CM | POA: Diagnosis not present

## 2020-08-12 DIAGNOSIS — I872 Venous insufficiency (chronic) (peripheral): Secondary | ICD-10-CM | POA: Diagnosis not present

## 2020-08-12 DIAGNOSIS — Z1389 Encounter for screening for other disorder: Secondary | ICD-10-CM | POA: Diagnosis not present

## 2020-08-12 DIAGNOSIS — N3281 Overactive bladder: Secondary | ICD-10-CM | POA: Diagnosis not present

## 2020-08-12 DIAGNOSIS — Z0000 Encounter for general adult medical examination without abnormal findings: Secondary | ICD-10-CM | POA: Diagnosis not present

## 2020-08-12 DIAGNOSIS — M810 Age-related osteoporosis without current pathological fracture: Secondary | ICD-10-CM | POA: Diagnosis not present

## 2020-08-16 ENCOUNTER — Ambulatory Visit
Admission: RE | Admit: 2020-08-16 | Discharge: 2020-08-16 | Disposition: A | Discharge status: Home / Self Care | Payer: Medicare Other | Source: Ambulatory Visit | Attending: Internal Medicine | Admitting: Internal Medicine

## 2020-08-16 ENCOUNTER — Other Ambulatory Visit: Payer: Self-pay | Admitting: Internal Medicine

## 2020-08-16 DIAGNOSIS — S42024A Nondisplaced fracture of shaft of right clavicle, initial encounter for closed fracture: Secondary | ICD-10-CM | POA: Diagnosis not present

## 2020-08-16 DIAGNOSIS — S42001A Fracture of unspecified part of right clavicle, initial encounter for closed fracture: Secondary | ICD-10-CM | POA: Diagnosis not present

## 2020-08-16 DIAGNOSIS — M25511 Pain in right shoulder: Secondary | ICD-10-CM

## 2020-08-16 DIAGNOSIS — W19XXXA Unspecified fall, initial encounter: Secondary | ICD-10-CM | POA: Diagnosis not present

## 2020-08-16 DIAGNOSIS — M40209 Unspecified kyphosis, site unspecified: Secondary | ICD-10-CM | POA: Diagnosis not present

## 2020-08-16 DIAGNOSIS — S0081XA Abrasion of other part of head, initial encounter: Secondary | ICD-10-CM | POA: Diagnosis not present

## 2020-08-16 DIAGNOSIS — M4854XA Collapsed vertebra, not elsewhere classified, thoracic region, initial encounter for fracture: Secondary | ICD-10-CM | POA: Diagnosis not present

## 2020-08-22 DIAGNOSIS — I051 Rheumatic mitral insufficiency: Secondary | ICD-10-CM | POA: Diagnosis not present

## 2020-08-22 DIAGNOSIS — M1990 Unspecified osteoarthritis, unspecified site: Secondary | ICD-10-CM | POA: Diagnosis not present

## 2020-08-22 DIAGNOSIS — M800AXD Age-related osteoporosis with current pathological fracture, other site, subsequent encounter for fracture with routine healing: Secondary | ICD-10-CM | POA: Diagnosis not present

## 2020-08-22 DIAGNOSIS — H53003 Unspecified amblyopia, bilateral: Secondary | ICD-10-CM | POA: Diagnosis not present

## 2020-08-22 DIAGNOSIS — M7072 Other bursitis of hip, left hip: Secondary | ICD-10-CM | POA: Diagnosis not present

## 2020-08-22 DIAGNOSIS — G44229 Chronic tension-type headache, not intractable: Secondary | ICD-10-CM | POA: Diagnosis not present

## 2020-08-22 DIAGNOSIS — N3281 Overactive bladder: Secondary | ICD-10-CM | POA: Diagnosis not present

## 2020-08-22 DIAGNOSIS — D509 Iron deficiency anemia, unspecified: Secondary | ICD-10-CM | POA: Diagnosis not present

## 2020-08-22 DIAGNOSIS — I509 Heart failure, unspecified: Secondary | ICD-10-CM | POA: Diagnosis not present

## 2020-08-22 DIAGNOSIS — Z853 Personal history of malignant neoplasm of breast: Secondary | ICD-10-CM | POA: Diagnosis not present

## 2020-08-22 DIAGNOSIS — I872 Venous insufficiency (chronic) (peripheral): Secondary | ICD-10-CM | POA: Diagnosis not present

## 2020-08-22 DIAGNOSIS — I712 Thoracic aortic aneurysm, without rupture: Secondary | ICD-10-CM | POA: Diagnosis not present

## 2020-08-22 DIAGNOSIS — M353 Polymyalgia rheumatica: Secondary | ICD-10-CM | POA: Diagnosis not present

## 2020-08-22 DIAGNOSIS — E039 Hypothyroidism, unspecified: Secondary | ICD-10-CM | POA: Diagnosis not present

## 2020-08-22 DIAGNOSIS — Z8572 Personal history of non-Hodgkin lymphomas: Secondary | ICD-10-CM | POA: Diagnosis not present

## 2020-08-22 DIAGNOSIS — K219 Gastro-esophageal reflux disease without esophagitis: Secondary | ICD-10-CM | POA: Diagnosis not present

## 2020-08-22 DIAGNOSIS — K5790 Diverticulosis of intestine, part unspecified, without perforation or abscess without bleeding: Secondary | ICD-10-CM | POA: Diagnosis not present

## 2020-08-22 DIAGNOSIS — E7800 Pure hypercholesterolemia, unspecified: Secondary | ICD-10-CM | POA: Diagnosis not present

## 2020-08-22 DIAGNOSIS — S42031A Displaced fracture of lateral end of right clavicle, initial encounter for closed fracture: Secondary | ICD-10-CM | POA: Diagnosis not present

## 2020-08-22 DIAGNOSIS — M797 Fibromyalgia: Secondary | ICD-10-CM | POA: Diagnosis not present

## 2020-08-22 DIAGNOSIS — I480 Paroxysmal atrial fibrillation: Secondary | ICD-10-CM | POA: Diagnosis not present

## 2020-08-22 DIAGNOSIS — R350 Frequency of micturition: Secondary | ICD-10-CM | POA: Diagnosis not present

## 2020-08-22 DIAGNOSIS — E782 Mixed hyperlipidemia: Secondary | ICD-10-CM | POA: Diagnosis not present

## 2020-08-22 DIAGNOSIS — Z86718 Personal history of other venous thrombosis and embolism: Secondary | ICD-10-CM | POA: Diagnosis not present

## 2020-08-22 DIAGNOSIS — I110 Hypertensive heart disease with heart failure: Secondary | ICD-10-CM | POA: Diagnosis not present

## 2020-08-22 DIAGNOSIS — W19XXXD Unspecified fall, subsequent encounter: Secondary | ICD-10-CM | POA: Diagnosis not present

## 2020-08-23 DIAGNOSIS — S42024D Nondisplaced fracture of shaft of right clavicle, subsequent encounter for fracture with routine healing: Secondary | ICD-10-CM | POA: Diagnosis not present

## 2020-08-24 DIAGNOSIS — I509 Heart failure, unspecified: Secondary | ICD-10-CM | POA: Diagnosis not present

## 2020-08-24 DIAGNOSIS — E7800 Pure hypercholesterolemia, unspecified: Secondary | ICD-10-CM | POA: Diagnosis not present

## 2020-08-24 DIAGNOSIS — M800AXD Age-related osteoporosis with current pathological fracture, other site, subsequent encounter for fracture with routine healing: Secondary | ICD-10-CM | POA: Diagnosis not present

## 2020-08-24 DIAGNOSIS — I480 Paroxysmal atrial fibrillation: Secondary | ICD-10-CM | POA: Diagnosis not present

## 2020-08-24 DIAGNOSIS — I110 Hypertensive heart disease with heart failure: Secondary | ICD-10-CM | POA: Diagnosis not present

## 2020-08-24 DIAGNOSIS — E782 Mixed hyperlipidemia: Secondary | ICD-10-CM | POA: Diagnosis not present

## 2020-08-26 DIAGNOSIS — H1131 Conjunctival hemorrhage, right eye: Secondary | ICD-10-CM | POA: Diagnosis not present

## 2020-08-30 DIAGNOSIS — I110 Hypertensive heart disease with heart failure: Secondary | ICD-10-CM | POA: Diagnosis not present

## 2020-08-30 DIAGNOSIS — E7800 Pure hypercholesterolemia, unspecified: Secondary | ICD-10-CM | POA: Diagnosis not present

## 2020-08-30 DIAGNOSIS — I480 Paroxysmal atrial fibrillation: Secondary | ICD-10-CM | POA: Diagnosis not present

## 2020-08-30 DIAGNOSIS — E782 Mixed hyperlipidemia: Secondary | ICD-10-CM | POA: Diagnosis not present

## 2020-08-30 DIAGNOSIS — M800AXD Age-related osteoporosis with current pathological fracture, other site, subsequent encounter for fracture with routine healing: Secondary | ICD-10-CM | POA: Diagnosis not present

## 2020-08-30 DIAGNOSIS — I509 Heart failure, unspecified: Secondary | ICD-10-CM | POA: Diagnosis not present

## 2020-09-02 DIAGNOSIS — M800AXD Age-related osteoporosis with current pathological fracture, other site, subsequent encounter for fracture with routine healing: Secondary | ICD-10-CM | POA: Diagnosis not present

## 2020-09-02 DIAGNOSIS — E7800 Pure hypercholesterolemia, unspecified: Secondary | ICD-10-CM | POA: Diagnosis not present

## 2020-09-02 DIAGNOSIS — I480 Paroxysmal atrial fibrillation: Secondary | ICD-10-CM | POA: Diagnosis not present

## 2020-09-02 DIAGNOSIS — I509 Heart failure, unspecified: Secondary | ICD-10-CM | POA: Diagnosis not present

## 2020-09-02 DIAGNOSIS — I110 Hypertensive heart disease with heart failure: Secondary | ICD-10-CM | POA: Diagnosis not present

## 2020-09-02 DIAGNOSIS — E782 Mixed hyperlipidemia: Secondary | ICD-10-CM | POA: Diagnosis not present

## 2020-09-06 DIAGNOSIS — M800AXD Age-related osteoporosis with current pathological fracture, other site, subsequent encounter for fracture with routine healing: Secondary | ICD-10-CM | POA: Diagnosis not present

## 2020-09-06 DIAGNOSIS — E7800 Pure hypercholesterolemia, unspecified: Secondary | ICD-10-CM | POA: Diagnosis not present

## 2020-09-06 DIAGNOSIS — I480 Paroxysmal atrial fibrillation: Secondary | ICD-10-CM | POA: Diagnosis not present

## 2020-09-06 DIAGNOSIS — I110 Hypertensive heart disease with heart failure: Secondary | ICD-10-CM | POA: Diagnosis not present

## 2020-09-06 DIAGNOSIS — I509 Heart failure, unspecified: Secondary | ICD-10-CM | POA: Diagnosis not present

## 2020-09-06 DIAGNOSIS — E782 Mixed hyperlipidemia: Secondary | ICD-10-CM | POA: Diagnosis not present

## 2020-09-08 DIAGNOSIS — I509 Heart failure, unspecified: Secondary | ICD-10-CM | POA: Diagnosis not present

## 2020-09-08 DIAGNOSIS — I110 Hypertensive heart disease with heart failure: Secondary | ICD-10-CM | POA: Diagnosis not present

## 2020-09-08 DIAGNOSIS — I480 Paroxysmal atrial fibrillation: Secondary | ICD-10-CM | POA: Diagnosis not present

## 2020-09-08 DIAGNOSIS — E782 Mixed hyperlipidemia: Secondary | ICD-10-CM | POA: Diagnosis not present

## 2020-09-08 DIAGNOSIS — E7800 Pure hypercholesterolemia, unspecified: Secondary | ICD-10-CM | POA: Diagnosis not present

## 2020-09-08 DIAGNOSIS — M800AXD Age-related osteoporosis with current pathological fracture, other site, subsequent encounter for fracture with routine healing: Secondary | ICD-10-CM | POA: Diagnosis not present

## 2020-09-09 DIAGNOSIS — I110 Hypertensive heart disease with heart failure: Secondary | ICD-10-CM | POA: Diagnosis not present

## 2020-09-09 DIAGNOSIS — M800AXD Age-related osteoporosis with current pathological fracture, other site, subsequent encounter for fracture with routine healing: Secondary | ICD-10-CM | POA: Diagnosis not present

## 2020-09-09 DIAGNOSIS — I509 Heart failure, unspecified: Secondary | ICD-10-CM | POA: Diagnosis not present

## 2020-09-09 DIAGNOSIS — E7800 Pure hypercholesterolemia, unspecified: Secondary | ICD-10-CM | POA: Diagnosis not present

## 2020-09-09 DIAGNOSIS — E782 Mixed hyperlipidemia: Secondary | ICD-10-CM | POA: Diagnosis not present

## 2020-09-09 DIAGNOSIS — I480 Paroxysmal atrial fibrillation: Secondary | ICD-10-CM | POA: Diagnosis not present

## 2020-09-13 DIAGNOSIS — E7800 Pure hypercholesterolemia, unspecified: Secondary | ICD-10-CM | POA: Diagnosis not present

## 2020-09-13 DIAGNOSIS — M800AXD Age-related osteoporosis with current pathological fracture, other site, subsequent encounter for fracture with routine healing: Secondary | ICD-10-CM | POA: Diagnosis not present

## 2020-09-13 DIAGNOSIS — E782 Mixed hyperlipidemia: Secondary | ICD-10-CM | POA: Diagnosis not present

## 2020-09-13 DIAGNOSIS — I480 Paroxysmal atrial fibrillation: Secondary | ICD-10-CM | POA: Diagnosis not present

## 2020-09-13 DIAGNOSIS — I110 Hypertensive heart disease with heart failure: Secondary | ICD-10-CM | POA: Diagnosis not present

## 2020-09-13 DIAGNOSIS — I509 Heart failure, unspecified: Secondary | ICD-10-CM | POA: Diagnosis not present

## 2020-09-16 DIAGNOSIS — I110 Hypertensive heart disease with heart failure: Secondary | ICD-10-CM | POA: Diagnosis not present

## 2020-09-16 DIAGNOSIS — M800AXD Age-related osteoporosis with current pathological fracture, other site, subsequent encounter for fracture with routine healing: Secondary | ICD-10-CM | POA: Diagnosis not present

## 2020-09-16 DIAGNOSIS — E7800 Pure hypercholesterolemia, unspecified: Secondary | ICD-10-CM | POA: Diagnosis not present

## 2020-09-16 DIAGNOSIS — I509 Heart failure, unspecified: Secondary | ICD-10-CM | POA: Diagnosis not present

## 2020-09-16 DIAGNOSIS — I480 Paroxysmal atrial fibrillation: Secondary | ICD-10-CM | POA: Diagnosis not present

## 2020-09-16 DIAGNOSIS — E782 Mixed hyperlipidemia: Secondary | ICD-10-CM | POA: Diagnosis not present

## 2020-09-20 DIAGNOSIS — I480 Paroxysmal atrial fibrillation: Secondary | ICD-10-CM | POA: Diagnosis not present

## 2020-09-20 DIAGNOSIS — I509 Heart failure, unspecified: Secondary | ICD-10-CM | POA: Diagnosis not present

## 2020-09-20 DIAGNOSIS — M800AXD Age-related osteoporosis with current pathological fracture, other site, subsequent encounter for fracture with routine healing: Secondary | ICD-10-CM | POA: Diagnosis not present

## 2020-09-20 DIAGNOSIS — E7800 Pure hypercholesterolemia, unspecified: Secondary | ICD-10-CM | POA: Diagnosis not present

## 2020-09-20 DIAGNOSIS — I110 Hypertensive heart disease with heart failure: Secondary | ICD-10-CM | POA: Diagnosis not present

## 2020-09-20 DIAGNOSIS — E782 Mixed hyperlipidemia: Secondary | ICD-10-CM | POA: Diagnosis not present

## 2020-09-21 ENCOUNTER — Ambulatory Visit: Payer: Medicare Other | Admitting: Cardiology

## 2020-09-21 DIAGNOSIS — R350 Frequency of micturition: Secondary | ICD-10-CM | POA: Diagnosis not present

## 2020-09-21 DIAGNOSIS — Z8572 Personal history of non-Hodgkin lymphomas: Secondary | ICD-10-CM | POA: Diagnosis not present

## 2020-09-21 DIAGNOSIS — M800AXD Age-related osteoporosis with current pathological fracture, other site, subsequent encounter for fracture with routine healing: Secondary | ICD-10-CM | POA: Diagnosis not present

## 2020-09-21 DIAGNOSIS — E782 Mixed hyperlipidemia: Secondary | ICD-10-CM | POA: Diagnosis not present

## 2020-09-21 DIAGNOSIS — K5790 Diverticulosis of intestine, part unspecified, without perforation or abscess without bleeding: Secondary | ICD-10-CM | POA: Diagnosis not present

## 2020-09-21 DIAGNOSIS — K219 Gastro-esophageal reflux disease without esophagitis: Secondary | ICD-10-CM | POA: Diagnosis not present

## 2020-09-21 DIAGNOSIS — I051 Rheumatic mitral insufficiency: Secondary | ICD-10-CM | POA: Diagnosis not present

## 2020-09-21 DIAGNOSIS — I712 Thoracic aortic aneurysm, without rupture: Secondary | ICD-10-CM | POA: Diagnosis not present

## 2020-09-21 DIAGNOSIS — E039 Hypothyroidism, unspecified: Secondary | ICD-10-CM | POA: Diagnosis not present

## 2020-09-21 DIAGNOSIS — Z86718 Personal history of other venous thrombosis and embolism: Secondary | ICD-10-CM | POA: Diagnosis not present

## 2020-09-21 DIAGNOSIS — D509 Iron deficiency anemia, unspecified: Secondary | ICD-10-CM | POA: Diagnosis not present

## 2020-09-21 DIAGNOSIS — H53003 Unspecified amblyopia, bilateral: Secondary | ICD-10-CM | POA: Diagnosis not present

## 2020-09-21 DIAGNOSIS — I872 Venous insufficiency (chronic) (peripheral): Secondary | ICD-10-CM | POA: Diagnosis not present

## 2020-09-21 DIAGNOSIS — M797 Fibromyalgia: Secondary | ICD-10-CM | POA: Diagnosis not present

## 2020-09-21 DIAGNOSIS — I480 Paroxysmal atrial fibrillation: Secondary | ICD-10-CM | POA: Diagnosis not present

## 2020-09-21 DIAGNOSIS — G44229 Chronic tension-type headache, not intractable: Secondary | ICD-10-CM | POA: Diagnosis not present

## 2020-09-21 DIAGNOSIS — I509 Heart failure, unspecified: Secondary | ICD-10-CM | POA: Diagnosis not present

## 2020-09-21 DIAGNOSIS — N3281 Overactive bladder: Secondary | ICD-10-CM | POA: Diagnosis not present

## 2020-09-21 DIAGNOSIS — S42031A Displaced fracture of lateral end of right clavicle, initial encounter for closed fracture: Secondary | ICD-10-CM | POA: Diagnosis not present

## 2020-09-21 DIAGNOSIS — E7800 Pure hypercholesterolemia, unspecified: Secondary | ICD-10-CM | POA: Diagnosis not present

## 2020-09-21 DIAGNOSIS — M7072 Other bursitis of hip, left hip: Secondary | ICD-10-CM | POA: Diagnosis not present

## 2020-09-21 DIAGNOSIS — M1990 Unspecified osteoarthritis, unspecified site: Secondary | ICD-10-CM | POA: Diagnosis not present

## 2020-09-21 DIAGNOSIS — I110 Hypertensive heart disease with heart failure: Secondary | ICD-10-CM | POA: Diagnosis not present

## 2020-09-21 DIAGNOSIS — W19XXXD Unspecified fall, subsequent encounter: Secondary | ICD-10-CM | POA: Diagnosis not present

## 2020-09-21 DIAGNOSIS — Z853 Personal history of malignant neoplasm of breast: Secondary | ICD-10-CM | POA: Diagnosis not present

## 2020-09-21 DIAGNOSIS — M353 Polymyalgia rheumatica: Secondary | ICD-10-CM | POA: Diagnosis not present

## 2020-09-22 DIAGNOSIS — E7800 Pure hypercholesterolemia, unspecified: Secondary | ICD-10-CM | POA: Diagnosis not present

## 2020-09-22 DIAGNOSIS — I509 Heart failure, unspecified: Secondary | ICD-10-CM | POA: Diagnosis not present

## 2020-09-22 DIAGNOSIS — I110 Hypertensive heart disease with heart failure: Secondary | ICD-10-CM | POA: Diagnosis not present

## 2020-09-22 DIAGNOSIS — M800AXD Age-related osteoporosis with current pathological fracture, other site, subsequent encounter for fracture with routine healing: Secondary | ICD-10-CM | POA: Diagnosis not present

## 2020-09-22 DIAGNOSIS — E782 Mixed hyperlipidemia: Secondary | ICD-10-CM | POA: Diagnosis not present

## 2020-09-22 DIAGNOSIS — I480 Paroxysmal atrial fibrillation: Secondary | ICD-10-CM | POA: Diagnosis not present

## 2020-09-23 DIAGNOSIS — M800AXD Age-related osteoporosis with current pathological fracture, other site, subsequent encounter for fracture with routine healing: Secondary | ICD-10-CM | POA: Diagnosis not present

## 2020-09-23 DIAGNOSIS — E7800 Pure hypercholesterolemia, unspecified: Secondary | ICD-10-CM | POA: Diagnosis not present

## 2020-09-23 DIAGNOSIS — I110 Hypertensive heart disease with heart failure: Secondary | ICD-10-CM | POA: Diagnosis not present

## 2020-09-23 DIAGNOSIS — I509 Heart failure, unspecified: Secondary | ICD-10-CM | POA: Diagnosis not present

## 2020-09-23 DIAGNOSIS — E782 Mixed hyperlipidemia: Secondary | ICD-10-CM | POA: Diagnosis not present

## 2020-09-23 DIAGNOSIS — I480 Paroxysmal atrial fibrillation: Secondary | ICD-10-CM | POA: Diagnosis not present

## 2020-09-27 ENCOUNTER — Other Ambulatory Visit: Payer: Self-pay | Admitting: Cardiology

## 2020-09-27 DIAGNOSIS — M545 Low back pain, unspecified: Secondary | ICD-10-CM | POA: Diagnosis not present

## 2020-09-27 DIAGNOSIS — S42024D Nondisplaced fracture of shaft of right clavicle, subsequent encounter for fracture with routine healing: Secondary | ICD-10-CM | POA: Diagnosis not present

## 2020-09-28 DIAGNOSIS — E782 Mixed hyperlipidemia: Secondary | ICD-10-CM | POA: Diagnosis not present

## 2020-09-28 DIAGNOSIS — I509 Heart failure, unspecified: Secondary | ICD-10-CM | POA: Diagnosis not present

## 2020-09-28 DIAGNOSIS — I480 Paroxysmal atrial fibrillation: Secondary | ICD-10-CM | POA: Diagnosis not present

## 2020-09-28 DIAGNOSIS — M800AXD Age-related osteoporosis with current pathological fracture, other site, subsequent encounter for fracture with routine healing: Secondary | ICD-10-CM | POA: Diagnosis not present

## 2020-09-28 DIAGNOSIS — I110 Hypertensive heart disease with heart failure: Secondary | ICD-10-CM | POA: Diagnosis not present

## 2020-09-28 DIAGNOSIS — E7800 Pure hypercholesterolemia, unspecified: Secondary | ICD-10-CM | POA: Diagnosis not present

## 2020-09-29 DIAGNOSIS — I110 Hypertensive heart disease with heart failure: Secondary | ICD-10-CM | POA: Diagnosis not present

## 2020-09-29 DIAGNOSIS — M800AXD Age-related osteoporosis with current pathological fracture, other site, subsequent encounter for fracture with routine healing: Secondary | ICD-10-CM | POA: Diagnosis not present

## 2020-09-29 DIAGNOSIS — I480 Paroxysmal atrial fibrillation: Secondary | ICD-10-CM | POA: Diagnosis not present

## 2020-09-29 DIAGNOSIS — E7800 Pure hypercholesterolemia, unspecified: Secondary | ICD-10-CM | POA: Diagnosis not present

## 2020-09-29 DIAGNOSIS — E782 Mixed hyperlipidemia: Secondary | ICD-10-CM | POA: Diagnosis not present

## 2020-09-29 DIAGNOSIS — I509 Heart failure, unspecified: Secondary | ICD-10-CM | POA: Diagnosis not present

## 2020-09-30 DIAGNOSIS — I480 Paroxysmal atrial fibrillation: Secondary | ICD-10-CM | POA: Diagnosis not present

## 2020-09-30 DIAGNOSIS — I509 Heart failure, unspecified: Secondary | ICD-10-CM | POA: Diagnosis not present

## 2020-09-30 DIAGNOSIS — M800AXD Age-related osteoporosis with current pathological fracture, other site, subsequent encounter for fracture with routine healing: Secondary | ICD-10-CM | POA: Diagnosis not present

## 2020-09-30 DIAGNOSIS — E782 Mixed hyperlipidemia: Secondary | ICD-10-CM | POA: Diagnosis not present

## 2020-09-30 DIAGNOSIS — I110 Hypertensive heart disease with heart failure: Secondary | ICD-10-CM | POA: Diagnosis not present

## 2020-09-30 DIAGNOSIS — E7800 Pure hypercholesterolemia, unspecified: Secondary | ICD-10-CM | POA: Diagnosis not present

## 2020-10-04 DIAGNOSIS — E7800 Pure hypercholesterolemia, unspecified: Secondary | ICD-10-CM | POA: Diagnosis not present

## 2020-10-04 DIAGNOSIS — E782 Mixed hyperlipidemia: Secondary | ICD-10-CM | POA: Diagnosis not present

## 2020-10-04 DIAGNOSIS — I509 Heart failure, unspecified: Secondary | ICD-10-CM | POA: Diagnosis not present

## 2020-10-04 DIAGNOSIS — I480 Paroxysmal atrial fibrillation: Secondary | ICD-10-CM | POA: Diagnosis not present

## 2020-10-04 DIAGNOSIS — M800AXD Age-related osteoporosis with current pathological fracture, other site, subsequent encounter for fracture with routine healing: Secondary | ICD-10-CM | POA: Diagnosis not present

## 2020-10-04 DIAGNOSIS — I110 Hypertensive heart disease with heart failure: Secondary | ICD-10-CM | POA: Diagnosis not present

## 2020-10-05 DIAGNOSIS — E782 Mixed hyperlipidemia: Secondary | ICD-10-CM | POA: Diagnosis not present

## 2020-10-05 DIAGNOSIS — I509 Heart failure, unspecified: Secondary | ICD-10-CM | POA: Diagnosis not present

## 2020-10-05 DIAGNOSIS — I480 Paroxysmal atrial fibrillation: Secondary | ICD-10-CM | POA: Diagnosis not present

## 2020-10-05 DIAGNOSIS — I110 Hypertensive heart disease with heart failure: Secondary | ICD-10-CM | POA: Diagnosis not present

## 2020-10-05 DIAGNOSIS — M800AXD Age-related osteoporosis with current pathological fracture, other site, subsequent encounter for fracture with routine healing: Secondary | ICD-10-CM | POA: Diagnosis not present

## 2020-10-05 DIAGNOSIS — E7800 Pure hypercholesterolemia, unspecified: Secondary | ICD-10-CM | POA: Diagnosis not present

## 2020-10-07 DIAGNOSIS — M800AXD Age-related osteoporosis with current pathological fracture, other site, subsequent encounter for fracture with routine healing: Secondary | ICD-10-CM | POA: Diagnosis not present

## 2020-10-07 DIAGNOSIS — I480 Paroxysmal atrial fibrillation: Secondary | ICD-10-CM | POA: Diagnosis not present

## 2020-10-07 DIAGNOSIS — I110 Hypertensive heart disease with heart failure: Secondary | ICD-10-CM | POA: Diagnosis not present

## 2020-10-07 DIAGNOSIS — E782 Mixed hyperlipidemia: Secondary | ICD-10-CM | POA: Diagnosis not present

## 2020-10-07 DIAGNOSIS — E7800 Pure hypercholesterolemia, unspecified: Secondary | ICD-10-CM | POA: Diagnosis not present

## 2020-10-07 DIAGNOSIS — I509 Heart failure, unspecified: Secondary | ICD-10-CM | POA: Diagnosis not present

## 2020-10-10 ENCOUNTER — Other Ambulatory Visit: Payer: Self-pay | Admitting: Cardiology

## 2020-10-10 NOTE — Telephone Encounter (Signed)
Pt last saw Ermalinda Barrios, PA on 01/27/20, last labs 08/12/20 Creat 0.87 at [facility] per [facility], age 84, weight 50.3kg, based on specified criteria pt is on appropriate dosage of Eliquis 2.5mg  BID.  Will refill rx.

## 2020-10-11 DIAGNOSIS — E7800 Pure hypercholesterolemia, unspecified: Secondary | ICD-10-CM | POA: Diagnosis not present

## 2020-10-11 DIAGNOSIS — I110 Hypertensive heart disease with heart failure: Secondary | ICD-10-CM | POA: Diagnosis not present

## 2020-10-11 DIAGNOSIS — M800AXD Age-related osteoporosis with current pathological fracture, other site, subsequent encounter for fracture with routine healing: Secondary | ICD-10-CM | POA: Diagnosis not present

## 2020-10-11 DIAGNOSIS — E782 Mixed hyperlipidemia: Secondary | ICD-10-CM | POA: Diagnosis not present

## 2020-10-11 DIAGNOSIS — I509 Heart failure, unspecified: Secondary | ICD-10-CM | POA: Diagnosis not present

## 2020-10-11 DIAGNOSIS — I480 Paroxysmal atrial fibrillation: Secondary | ICD-10-CM | POA: Diagnosis not present

## 2020-10-12 DIAGNOSIS — E782 Mixed hyperlipidemia: Secondary | ICD-10-CM | POA: Diagnosis not present

## 2020-10-12 DIAGNOSIS — M800AXD Age-related osteoporosis with current pathological fracture, other site, subsequent encounter for fracture with routine healing: Secondary | ICD-10-CM | POA: Diagnosis not present

## 2020-10-12 DIAGNOSIS — E7800 Pure hypercholesterolemia, unspecified: Secondary | ICD-10-CM | POA: Diagnosis not present

## 2020-10-12 DIAGNOSIS — I480 Paroxysmal atrial fibrillation: Secondary | ICD-10-CM | POA: Diagnosis not present

## 2020-10-12 DIAGNOSIS — I509 Heart failure, unspecified: Secondary | ICD-10-CM | POA: Diagnosis not present

## 2020-10-12 DIAGNOSIS — I110 Hypertensive heart disease with heart failure: Secondary | ICD-10-CM | POA: Diagnosis not present

## 2020-10-17 DIAGNOSIS — E782 Mixed hyperlipidemia: Secondary | ICD-10-CM | POA: Diagnosis not present

## 2020-10-17 DIAGNOSIS — I509 Heart failure, unspecified: Secondary | ICD-10-CM | POA: Diagnosis not present

## 2020-10-17 DIAGNOSIS — M800AXD Age-related osteoporosis with current pathological fracture, other site, subsequent encounter for fracture with routine healing: Secondary | ICD-10-CM | POA: Diagnosis not present

## 2020-10-17 DIAGNOSIS — I110 Hypertensive heart disease with heart failure: Secondary | ICD-10-CM | POA: Diagnosis not present

## 2020-10-17 DIAGNOSIS — I480 Paroxysmal atrial fibrillation: Secondary | ICD-10-CM | POA: Diagnosis not present

## 2020-10-17 DIAGNOSIS — E7800 Pure hypercholesterolemia, unspecified: Secondary | ICD-10-CM | POA: Diagnosis not present

## 2020-10-20 DIAGNOSIS — M800AXD Age-related osteoporosis with current pathological fracture, other site, subsequent encounter for fracture with routine healing: Secondary | ICD-10-CM | POA: Diagnosis not present

## 2020-10-20 DIAGNOSIS — I509 Heart failure, unspecified: Secondary | ICD-10-CM | POA: Diagnosis not present

## 2020-10-20 DIAGNOSIS — E782 Mixed hyperlipidemia: Secondary | ICD-10-CM | POA: Diagnosis not present

## 2020-10-20 DIAGNOSIS — I110 Hypertensive heart disease with heart failure: Secondary | ICD-10-CM | POA: Diagnosis not present

## 2020-10-20 DIAGNOSIS — I480 Paroxysmal atrial fibrillation: Secondary | ICD-10-CM | POA: Diagnosis not present

## 2020-10-20 DIAGNOSIS — E7800 Pure hypercholesterolemia, unspecified: Secondary | ICD-10-CM | POA: Diagnosis not present

## 2020-10-21 DIAGNOSIS — G44229 Chronic tension-type headache, not intractable: Secondary | ICD-10-CM | POA: Diagnosis not present

## 2020-10-21 DIAGNOSIS — K5790 Diverticulosis of intestine, part unspecified, without perforation or abscess without bleeding: Secondary | ICD-10-CM | POA: Diagnosis not present

## 2020-10-21 DIAGNOSIS — I509 Heart failure, unspecified: Secondary | ICD-10-CM | POA: Diagnosis not present

## 2020-10-21 DIAGNOSIS — I110 Hypertensive heart disease with heart failure: Secondary | ICD-10-CM | POA: Diagnosis not present

## 2020-10-21 DIAGNOSIS — M797 Fibromyalgia: Secondary | ICD-10-CM | POA: Diagnosis not present

## 2020-10-21 DIAGNOSIS — W19XXXD Unspecified fall, subsequent encounter: Secondary | ICD-10-CM | POA: Diagnosis not present

## 2020-10-21 DIAGNOSIS — I872 Venous insufficiency (chronic) (peripheral): Secondary | ICD-10-CM | POA: Diagnosis not present

## 2020-10-21 DIAGNOSIS — E039 Hypothyroidism, unspecified: Secondary | ICD-10-CM | POA: Diagnosis not present

## 2020-10-21 DIAGNOSIS — D509 Iron deficiency anemia, unspecified: Secondary | ICD-10-CM | POA: Diagnosis not present

## 2020-10-21 DIAGNOSIS — Z8572 Personal history of non-Hodgkin lymphomas: Secondary | ICD-10-CM | POA: Diagnosis not present

## 2020-10-21 DIAGNOSIS — M800AXD Age-related osteoporosis with current pathological fracture, other site, subsequent encounter for fracture with routine healing: Secondary | ICD-10-CM | POA: Diagnosis not present

## 2020-10-21 DIAGNOSIS — I712 Thoracic aortic aneurysm, without rupture: Secondary | ICD-10-CM | POA: Diagnosis not present

## 2020-10-21 DIAGNOSIS — N3281 Overactive bladder: Secondary | ICD-10-CM | POA: Diagnosis not present

## 2020-10-21 DIAGNOSIS — R350 Frequency of micturition: Secondary | ICD-10-CM | POA: Diagnosis not present

## 2020-10-21 DIAGNOSIS — Z853 Personal history of malignant neoplasm of breast: Secondary | ICD-10-CM | POA: Diagnosis not present

## 2020-10-21 DIAGNOSIS — I480 Paroxysmal atrial fibrillation: Secondary | ICD-10-CM | POA: Diagnosis not present

## 2020-10-21 DIAGNOSIS — E782 Mixed hyperlipidemia: Secondary | ICD-10-CM | POA: Diagnosis not present

## 2020-10-21 DIAGNOSIS — M353 Polymyalgia rheumatica: Secondary | ICD-10-CM | POA: Diagnosis not present

## 2020-10-21 DIAGNOSIS — M1990 Unspecified osteoarthritis, unspecified site: Secondary | ICD-10-CM | POA: Diagnosis not present

## 2020-10-21 DIAGNOSIS — H53003 Unspecified amblyopia, bilateral: Secondary | ICD-10-CM | POA: Diagnosis not present

## 2020-10-21 DIAGNOSIS — M7072 Other bursitis of hip, left hip: Secondary | ICD-10-CM | POA: Diagnosis not present

## 2020-10-21 DIAGNOSIS — Z86718 Personal history of other venous thrombosis and embolism: Secondary | ICD-10-CM | POA: Diagnosis not present

## 2020-10-21 DIAGNOSIS — I051 Rheumatic mitral insufficiency: Secondary | ICD-10-CM | POA: Diagnosis not present

## 2020-10-21 DIAGNOSIS — E7800 Pure hypercholesterolemia, unspecified: Secondary | ICD-10-CM | POA: Diagnosis not present

## 2020-10-21 DIAGNOSIS — K219 Gastro-esophageal reflux disease without esophagitis: Secondary | ICD-10-CM | POA: Diagnosis not present

## 2020-10-24 DIAGNOSIS — I509 Heart failure, unspecified: Secondary | ICD-10-CM | POA: Diagnosis not present

## 2020-10-24 DIAGNOSIS — E782 Mixed hyperlipidemia: Secondary | ICD-10-CM | POA: Diagnosis not present

## 2020-10-24 DIAGNOSIS — I110 Hypertensive heart disease with heart failure: Secondary | ICD-10-CM | POA: Diagnosis not present

## 2020-10-24 DIAGNOSIS — M800AXD Age-related osteoporosis with current pathological fracture, other site, subsequent encounter for fracture with routine healing: Secondary | ICD-10-CM | POA: Diagnosis not present

## 2020-10-24 DIAGNOSIS — I480 Paroxysmal atrial fibrillation: Secondary | ICD-10-CM | POA: Diagnosis not present

## 2020-10-24 DIAGNOSIS — E7800 Pure hypercholesterolemia, unspecified: Secondary | ICD-10-CM | POA: Diagnosis not present

## 2020-10-27 DIAGNOSIS — I480 Paroxysmal atrial fibrillation: Secondary | ICD-10-CM | POA: Diagnosis not present

## 2020-10-27 DIAGNOSIS — I110 Hypertensive heart disease with heart failure: Secondary | ICD-10-CM | POA: Diagnosis not present

## 2020-10-27 DIAGNOSIS — I509 Heart failure, unspecified: Secondary | ICD-10-CM | POA: Diagnosis not present

## 2020-10-27 DIAGNOSIS — E7800 Pure hypercholesterolemia, unspecified: Secondary | ICD-10-CM | POA: Diagnosis not present

## 2020-10-27 DIAGNOSIS — E782 Mixed hyperlipidemia: Secondary | ICD-10-CM | POA: Diagnosis not present

## 2020-10-27 DIAGNOSIS — M800AXD Age-related osteoporosis with current pathological fracture, other site, subsequent encounter for fracture with routine healing: Secondary | ICD-10-CM | POA: Diagnosis not present

## 2020-10-31 DIAGNOSIS — M25511 Pain in right shoulder: Secondary | ICD-10-CM | POA: Diagnosis not present

## 2020-11-02 DIAGNOSIS — M800AXD Age-related osteoporosis with current pathological fracture, other site, subsequent encounter for fracture with routine healing: Secondary | ICD-10-CM | POA: Diagnosis not present

## 2020-11-02 DIAGNOSIS — E7800 Pure hypercholesterolemia, unspecified: Secondary | ICD-10-CM | POA: Diagnosis not present

## 2020-11-02 DIAGNOSIS — I480 Paroxysmal atrial fibrillation: Secondary | ICD-10-CM | POA: Diagnosis not present

## 2020-11-02 DIAGNOSIS — I110 Hypertensive heart disease with heart failure: Secondary | ICD-10-CM | POA: Diagnosis not present

## 2020-11-02 DIAGNOSIS — I509 Heart failure, unspecified: Secondary | ICD-10-CM | POA: Diagnosis not present

## 2020-11-02 DIAGNOSIS — E782 Mixed hyperlipidemia: Secondary | ICD-10-CM | POA: Diagnosis not present

## 2020-11-07 DIAGNOSIS — E7800 Pure hypercholesterolemia, unspecified: Secondary | ICD-10-CM | POA: Diagnosis not present

## 2020-11-07 DIAGNOSIS — M800AXD Age-related osteoporosis with current pathological fracture, other site, subsequent encounter for fracture with routine healing: Secondary | ICD-10-CM | POA: Diagnosis not present

## 2020-11-07 DIAGNOSIS — E782 Mixed hyperlipidemia: Secondary | ICD-10-CM | POA: Diagnosis not present

## 2020-11-07 DIAGNOSIS — I509 Heart failure, unspecified: Secondary | ICD-10-CM | POA: Diagnosis not present

## 2020-11-07 DIAGNOSIS — I480 Paroxysmal atrial fibrillation: Secondary | ICD-10-CM | POA: Diagnosis not present

## 2020-11-07 DIAGNOSIS — I110 Hypertensive heart disease with heart failure: Secondary | ICD-10-CM | POA: Diagnosis not present

## 2020-11-08 ENCOUNTER — Ambulatory Visit
Admission: RE | Admit: 2020-11-08 | Discharge: 2020-11-08 | Disposition: A | Discharge status: Home / Self Care | Payer: Medicare Other | Source: Ambulatory Visit | Attending: Geriatric Medicine | Admitting: Geriatric Medicine

## 2020-11-08 ENCOUNTER — Other Ambulatory Visit: Payer: Self-pay | Admitting: Geriatric Medicine

## 2020-11-08 DIAGNOSIS — R600 Localized edema: Secondary | ICD-10-CM | POA: Diagnosis not present

## 2020-11-08 DIAGNOSIS — M79605 Pain in left leg: Secondary | ICD-10-CM | POA: Diagnosis not present

## 2020-11-15 DIAGNOSIS — I110 Hypertensive heart disease with heart failure: Secondary | ICD-10-CM | POA: Diagnosis not present

## 2020-11-15 DIAGNOSIS — M800AXD Age-related osteoporosis with current pathological fracture, other site, subsequent encounter for fracture with routine healing: Secondary | ICD-10-CM | POA: Diagnosis not present

## 2020-11-15 DIAGNOSIS — E7800 Pure hypercholesterolemia, unspecified: Secondary | ICD-10-CM | POA: Diagnosis not present

## 2020-11-15 DIAGNOSIS — E782 Mixed hyperlipidemia: Secondary | ICD-10-CM | POA: Diagnosis not present

## 2020-11-15 DIAGNOSIS — I480 Paroxysmal atrial fibrillation: Secondary | ICD-10-CM | POA: Diagnosis not present

## 2020-11-15 DIAGNOSIS — I509 Heart failure, unspecified: Secondary | ICD-10-CM | POA: Diagnosis not present

## 2020-11-20 DIAGNOSIS — K5790 Diverticulosis of intestine, part unspecified, without perforation or abscess without bleeding: Secondary | ICD-10-CM | POA: Diagnosis not present

## 2020-11-20 DIAGNOSIS — M7072 Other bursitis of hip, left hip: Secondary | ICD-10-CM | POA: Diagnosis not present

## 2020-11-20 DIAGNOSIS — M1990 Unspecified osteoarthritis, unspecified site: Secondary | ICD-10-CM | POA: Diagnosis not present

## 2020-11-20 DIAGNOSIS — I872 Venous insufficiency (chronic) (peripheral): Secondary | ICD-10-CM | POA: Diagnosis not present

## 2020-11-20 DIAGNOSIS — I480 Paroxysmal atrial fibrillation: Secondary | ICD-10-CM | POA: Diagnosis not present

## 2020-11-20 DIAGNOSIS — E782 Mixed hyperlipidemia: Secondary | ICD-10-CM | POA: Diagnosis not present

## 2020-11-20 DIAGNOSIS — I509 Heart failure, unspecified: Secondary | ICD-10-CM | POA: Diagnosis not present

## 2020-11-20 DIAGNOSIS — M800AXD Age-related osteoporosis with current pathological fracture, other site, subsequent encounter for fracture with routine healing: Secondary | ICD-10-CM | POA: Diagnosis not present

## 2020-11-20 DIAGNOSIS — I051 Rheumatic mitral insufficiency: Secondary | ICD-10-CM | POA: Diagnosis not present

## 2020-11-20 DIAGNOSIS — R350 Frequency of micturition: Secondary | ICD-10-CM | POA: Diagnosis not present

## 2020-11-20 DIAGNOSIS — K219 Gastro-esophageal reflux disease without esophagitis: Secondary | ICD-10-CM | POA: Diagnosis not present

## 2020-11-20 DIAGNOSIS — Z853 Personal history of malignant neoplasm of breast: Secondary | ICD-10-CM | POA: Diagnosis not present

## 2020-11-20 DIAGNOSIS — N3281 Overactive bladder: Secondary | ICD-10-CM | POA: Diagnosis not present

## 2020-11-20 DIAGNOSIS — E7800 Pure hypercholesterolemia, unspecified: Secondary | ICD-10-CM | POA: Diagnosis not present

## 2020-11-20 DIAGNOSIS — Z86718 Personal history of other venous thrombosis and embolism: Secondary | ICD-10-CM | POA: Diagnosis not present

## 2020-11-20 DIAGNOSIS — D509 Iron deficiency anemia, unspecified: Secondary | ICD-10-CM | POA: Diagnosis not present

## 2020-11-20 DIAGNOSIS — G44229 Chronic tension-type headache, not intractable: Secondary | ICD-10-CM | POA: Diagnosis not present

## 2020-11-20 DIAGNOSIS — H53003 Unspecified amblyopia, bilateral: Secondary | ICD-10-CM | POA: Diagnosis not present

## 2020-11-20 DIAGNOSIS — M797 Fibromyalgia: Secondary | ICD-10-CM | POA: Diagnosis not present

## 2020-11-20 DIAGNOSIS — W19XXXD Unspecified fall, subsequent encounter: Secondary | ICD-10-CM | POA: Diagnosis not present

## 2020-11-20 DIAGNOSIS — M353 Polymyalgia rheumatica: Secondary | ICD-10-CM | POA: Diagnosis not present

## 2020-11-20 DIAGNOSIS — I712 Thoracic aortic aneurysm, without rupture: Secondary | ICD-10-CM | POA: Diagnosis not present

## 2020-11-20 DIAGNOSIS — I110 Hypertensive heart disease with heart failure: Secondary | ICD-10-CM | POA: Diagnosis not present

## 2020-11-20 DIAGNOSIS — E039 Hypothyroidism, unspecified: Secondary | ICD-10-CM | POA: Diagnosis not present

## 2020-11-20 DIAGNOSIS — Z8572 Personal history of non-Hodgkin lymphomas: Secondary | ICD-10-CM | POA: Diagnosis not present

## 2020-11-21 ENCOUNTER — Telehealth: Payer: Self-pay | Admitting: Physician Assistant

## 2020-11-21 NOTE — Telephone Encounter (Signed)
Advised patient that we have submitted a prior authorization for Eliquis today. We will keep her updated and are prepared to appeal if denied. PA sent 1/10

## 2020-11-21 NOTE — Telephone Encounter (Signed)
Pt is calling to let our office know that she just received a letter from [facility] stating that as of Nov 12, 2020, her eliquis will no longer be covered by her plan, and they recommended she contact her Provider to switch her to coumadin or xarelto.  Pt states she is very upset with this, for Eliquis works best for her cardiac condition.  Pt states she does have about a month supply of her Eliquis on-hand at this time. Pt would like for our office to contact her insurance company and submit an "appeal" for her to remain on Eliquis vs switching to an alternative regimen.  Informed the pt that I will route this communication to our Prior Cornlea as well as our Pharmacist, to further review and work on Catering manager. Reassured the pt that everything will be ok and our Prior Auth Nurse and Pharmacist will work hard on trying to get this medication approved through her carrier, or switch to the next best alternative regimen. Informed the pt that our Pharmacist or Jeani Hawking will follow-up with her accordingly thereafter once final determination is complete. Pt verbalized understanding and agrees with this plan. Pt was more than gracious for all the assistance provided.

## 2020-11-21 NOTE — Telephone Encounter (Signed)
New Message:     Pt says she needs to talk to a nurse. She wants the nurse to relay a message to Dr Meda Coffee and she wants it did correctly. She wants this message given to Dr Meda Coffee asap please. If Dr Meda Coffee is unable to be reached, Melina Copa is the next person she wants.

## 2020-11-22 NOTE — Telephone Encounter (Addendum)
Eliquis prior auth approved through 11/21/21. Pt is aware and was very appreciative for assistance. She has never been set up with the $10/month copay card and is currently paying $47/month. Activated copay card and called rx to pharmacy.

## 2020-11-25 DIAGNOSIS — Z20822 Contact with and (suspected) exposure to covid-19: Secondary | ICD-10-CM | POA: Diagnosis not present

## 2020-12-05 DIAGNOSIS — I509 Heart failure, unspecified: Secondary | ICD-10-CM | POA: Diagnosis not present

## 2020-12-05 DIAGNOSIS — E7800 Pure hypercholesterolemia, unspecified: Secondary | ICD-10-CM | POA: Diagnosis not present

## 2020-12-05 DIAGNOSIS — I480 Paroxysmal atrial fibrillation: Secondary | ICD-10-CM | POA: Diagnosis not present

## 2020-12-05 DIAGNOSIS — M800AXD Age-related osteoporosis with current pathological fracture, other site, subsequent encounter for fracture with routine healing: Secondary | ICD-10-CM | POA: Diagnosis not present

## 2020-12-05 DIAGNOSIS — I110 Hypertensive heart disease with heart failure: Secondary | ICD-10-CM | POA: Diagnosis not present

## 2020-12-05 DIAGNOSIS — E782 Mixed hyperlipidemia: Secondary | ICD-10-CM | POA: Diagnosis not present

## 2020-12-15 DIAGNOSIS — I509 Heart failure, unspecified: Secondary | ICD-10-CM | POA: Diagnosis not present

## 2020-12-15 DIAGNOSIS — E782 Mixed hyperlipidemia: Secondary | ICD-10-CM | POA: Diagnosis not present

## 2020-12-15 DIAGNOSIS — M800AXD Age-related osteoporosis with current pathological fracture, other site, subsequent encounter for fracture with routine healing: Secondary | ICD-10-CM | POA: Diagnosis not present

## 2020-12-15 DIAGNOSIS — I480 Paroxysmal atrial fibrillation: Secondary | ICD-10-CM | POA: Diagnosis not present

## 2020-12-15 DIAGNOSIS — E7800 Pure hypercholesterolemia, unspecified: Secondary | ICD-10-CM | POA: Diagnosis not present

## 2020-12-15 DIAGNOSIS — I110 Hypertensive heart disease with heart failure: Secondary | ICD-10-CM | POA: Diagnosis not present

## 2020-12-20 DIAGNOSIS — Z8572 Personal history of non-Hodgkin lymphomas: Secondary | ICD-10-CM | POA: Diagnosis not present

## 2020-12-20 DIAGNOSIS — E7800 Pure hypercholesterolemia, unspecified: Secondary | ICD-10-CM | POA: Diagnosis not present

## 2020-12-20 DIAGNOSIS — K219 Gastro-esophageal reflux disease without esophagitis: Secondary | ICD-10-CM | POA: Diagnosis not present

## 2020-12-20 DIAGNOSIS — M800AXD Age-related osteoporosis with current pathological fracture, other site, subsequent encounter for fracture with routine healing: Secondary | ICD-10-CM | POA: Diagnosis not present

## 2020-12-20 DIAGNOSIS — I712 Thoracic aortic aneurysm, without rupture: Secondary | ICD-10-CM | POA: Diagnosis not present

## 2020-12-20 DIAGNOSIS — Z853 Personal history of malignant neoplasm of breast: Secondary | ICD-10-CM | POA: Diagnosis not present

## 2020-12-20 DIAGNOSIS — Z9181 History of falling: Secondary | ICD-10-CM | POA: Diagnosis not present

## 2020-12-20 DIAGNOSIS — E782 Mixed hyperlipidemia: Secondary | ICD-10-CM | POA: Diagnosis not present

## 2020-12-20 DIAGNOSIS — M353 Polymyalgia rheumatica: Secondary | ICD-10-CM | POA: Diagnosis not present

## 2020-12-20 DIAGNOSIS — M1990 Unspecified osteoarthritis, unspecified site: Secondary | ICD-10-CM | POA: Diagnosis not present

## 2020-12-20 DIAGNOSIS — Z7901 Long term (current) use of anticoagulants: Secondary | ICD-10-CM | POA: Diagnosis not present

## 2020-12-20 DIAGNOSIS — W19XXXD Unspecified fall, subsequent encounter: Secondary | ICD-10-CM | POA: Diagnosis not present

## 2020-12-20 DIAGNOSIS — I051 Rheumatic mitral insufficiency: Secondary | ICD-10-CM | POA: Diagnosis not present

## 2020-12-20 DIAGNOSIS — I480 Paroxysmal atrial fibrillation: Secondary | ICD-10-CM | POA: Diagnosis not present

## 2020-12-20 DIAGNOSIS — M7072 Other bursitis of hip, left hip: Secondary | ICD-10-CM | POA: Diagnosis not present

## 2020-12-20 DIAGNOSIS — N3281 Overactive bladder: Secondary | ICD-10-CM | POA: Diagnosis not present

## 2020-12-20 DIAGNOSIS — E039 Hypothyroidism, unspecified: Secondary | ICD-10-CM | POA: Diagnosis not present

## 2020-12-20 DIAGNOSIS — Z86718 Personal history of other venous thrombosis and embolism: Secondary | ICD-10-CM | POA: Diagnosis not present

## 2020-12-20 DIAGNOSIS — I872 Venous insufficiency (chronic) (peripheral): Secondary | ICD-10-CM | POA: Diagnosis not present

## 2020-12-20 DIAGNOSIS — H53003 Unspecified amblyopia, bilateral: Secondary | ICD-10-CM | POA: Diagnosis not present

## 2020-12-20 DIAGNOSIS — D509 Iron deficiency anemia, unspecified: Secondary | ICD-10-CM | POA: Diagnosis not present

## 2020-12-20 DIAGNOSIS — K5790 Diverticulosis of intestine, part unspecified, without perforation or abscess without bleeding: Secondary | ICD-10-CM | POA: Diagnosis not present

## 2020-12-20 DIAGNOSIS — I509 Heart failure, unspecified: Secondary | ICD-10-CM | POA: Diagnosis not present

## 2020-12-20 DIAGNOSIS — G44229 Chronic tension-type headache, not intractable: Secondary | ICD-10-CM | POA: Diagnosis not present

## 2020-12-20 DIAGNOSIS — I110 Hypertensive heart disease with heart failure: Secondary | ICD-10-CM | POA: Diagnosis not present

## 2020-12-23 DIAGNOSIS — E782 Mixed hyperlipidemia: Secondary | ICD-10-CM | POA: Diagnosis not present

## 2020-12-23 DIAGNOSIS — I480 Paroxysmal atrial fibrillation: Secondary | ICD-10-CM | POA: Diagnosis not present

## 2020-12-23 DIAGNOSIS — I110 Hypertensive heart disease with heart failure: Secondary | ICD-10-CM | POA: Diagnosis not present

## 2020-12-23 DIAGNOSIS — M800AXD Age-related osteoporosis with current pathological fracture, other site, subsequent encounter for fracture with routine healing: Secondary | ICD-10-CM | POA: Diagnosis not present

## 2020-12-23 DIAGNOSIS — I509 Heart failure, unspecified: Secondary | ICD-10-CM | POA: Diagnosis not present

## 2020-12-23 DIAGNOSIS — E7800 Pure hypercholesterolemia, unspecified: Secondary | ICD-10-CM | POA: Diagnosis not present

## 2020-12-26 ENCOUNTER — Other Ambulatory Visit: Payer: Self-pay | Admitting: Cardiology

## 2020-12-30 DIAGNOSIS — M800AXD Age-related osteoporosis with current pathological fracture, other site, subsequent encounter for fracture with routine healing: Secondary | ICD-10-CM | POA: Diagnosis not present

## 2020-12-30 DIAGNOSIS — E782 Mixed hyperlipidemia: Secondary | ICD-10-CM | POA: Diagnosis not present

## 2020-12-30 DIAGNOSIS — I480 Paroxysmal atrial fibrillation: Secondary | ICD-10-CM | POA: Diagnosis not present

## 2020-12-30 DIAGNOSIS — I509 Heart failure, unspecified: Secondary | ICD-10-CM | POA: Diagnosis not present

## 2020-12-30 DIAGNOSIS — I110 Hypertensive heart disease with heart failure: Secondary | ICD-10-CM | POA: Diagnosis not present

## 2020-12-30 DIAGNOSIS — E7800 Pure hypercholesterolemia, unspecified: Secondary | ICD-10-CM | POA: Diagnosis not present

## 2021-01-03 ENCOUNTER — Other Ambulatory Visit: Payer: Self-pay | Admitting: Physician Assistant

## 2021-01-06 DIAGNOSIS — I509 Heart failure, unspecified: Secondary | ICD-10-CM | POA: Diagnosis not present

## 2021-01-06 DIAGNOSIS — M800AXD Age-related osteoporosis with current pathological fracture, other site, subsequent encounter for fracture with routine healing: Secondary | ICD-10-CM | POA: Diagnosis not present

## 2021-01-06 DIAGNOSIS — I480 Paroxysmal atrial fibrillation: Secondary | ICD-10-CM | POA: Diagnosis not present

## 2021-01-06 DIAGNOSIS — E7800 Pure hypercholesterolemia, unspecified: Secondary | ICD-10-CM | POA: Diagnosis not present

## 2021-01-06 DIAGNOSIS — E782 Mixed hyperlipidemia: Secondary | ICD-10-CM | POA: Diagnosis not present

## 2021-01-06 DIAGNOSIS — I110 Hypertensive heart disease with heart failure: Secondary | ICD-10-CM | POA: Diagnosis not present

## 2021-01-13 DIAGNOSIS — I110 Hypertensive heart disease with heart failure: Secondary | ICD-10-CM | POA: Diagnosis not present

## 2021-01-13 DIAGNOSIS — E782 Mixed hyperlipidemia: Secondary | ICD-10-CM | POA: Diagnosis not present

## 2021-01-13 DIAGNOSIS — E7800 Pure hypercholesterolemia, unspecified: Secondary | ICD-10-CM | POA: Diagnosis not present

## 2021-01-13 DIAGNOSIS — M800AXD Age-related osteoporosis with current pathological fracture, other site, subsequent encounter for fracture with routine healing: Secondary | ICD-10-CM | POA: Diagnosis not present

## 2021-01-13 DIAGNOSIS — I509 Heart failure, unspecified: Secondary | ICD-10-CM | POA: Diagnosis not present

## 2021-01-13 DIAGNOSIS — I480 Paroxysmal atrial fibrillation: Secondary | ICD-10-CM | POA: Diagnosis not present

## 2021-01-19 DIAGNOSIS — I509 Heart failure, unspecified: Secondary | ICD-10-CM | POA: Diagnosis not present

## 2021-01-19 DIAGNOSIS — I872 Venous insufficiency (chronic) (peripheral): Secondary | ICD-10-CM | POA: Diagnosis not present

## 2021-01-19 DIAGNOSIS — I051 Rheumatic mitral insufficiency: Secondary | ICD-10-CM | POA: Diagnosis not present

## 2021-01-19 DIAGNOSIS — K219 Gastro-esophageal reflux disease without esophagitis: Secondary | ICD-10-CM | POA: Diagnosis not present

## 2021-01-19 DIAGNOSIS — W19XXXD Unspecified fall, subsequent encounter: Secondary | ICD-10-CM | POA: Diagnosis not present

## 2021-01-19 DIAGNOSIS — Z86718 Personal history of other venous thrombosis and embolism: Secondary | ICD-10-CM | POA: Diagnosis not present

## 2021-01-19 DIAGNOSIS — K5790 Diverticulosis of intestine, part unspecified, without perforation or abscess without bleeding: Secondary | ICD-10-CM | POA: Diagnosis not present

## 2021-01-19 DIAGNOSIS — I110 Hypertensive heart disease with heart failure: Secondary | ICD-10-CM | POA: Diagnosis not present

## 2021-01-19 DIAGNOSIS — M7072 Other bursitis of hip, left hip: Secondary | ICD-10-CM | POA: Diagnosis not present

## 2021-01-19 DIAGNOSIS — G44229 Chronic tension-type headache, not intractable: Secondary | ICD-10-CM | POA: Diagnosis not present

## 2021-01-19 DIAGNOSIS — Z853 Personal history of malignant neoplasm of breast: Secondary | ICD-10-CM | POA: Diagnosis not present

## 2021-01-19 DIAGNOSIS — I712 Thoracic aortic aneurysm, without rupture: Secondary | ICD-10-CM | POA: Diagnosis not present

## 2021-01-19 DIAGNOSIS — M353 Polymyalgia rheumatica: Secondary | ICD-10-CM | POA: Diagnosis not present

## 2021-01-19 DIAGNOSIS — Z9181 History of falling: Secondary | ICD-10-CM | POA: Diagnosis not present

## 2021-01-19 DIAGNOSIS — H53003 Unspecified amblyopia, bilateral: Secondary | ICD-10-CM | POA: Diagnosis not present

## 2021-01-19 DIAGNOSIS — M1990 Unspecified osteoarthritis, unspecified site: Secondary | ICD-10-CM | POA: Diagnosis not present

## 2021-01-19 DIAGNOSIS — N3281 Overactive bladder: Secondary | ICD-10-CM | POA: Diagnosis not present

## 2021-01-19 DIAGNOSIS — E782 Mixed hyperlipidemia: Secondary | ICD-10-CM | POA: Diagnosis not present

## 2021-01-19 DIAGNOSIS — Z8572 Personal history of non-Hodgkin lymphomas: Secondary | ICD-10-CM | POA: Diagnosis not present

## 2021-01-19 DIAGNOSIS — E039 Hypothyroidism, unspecified: Secondary | ICD-10-CM | POA: Diagnosis not present

## 2021-01-19 DIAGNOSIS — Z7901 Long term (current) use of anticoagulants: Secondary | ICD-10-CM | POA: Diagnosis not present

## 2021-01-19 DIAGNOSIS — E7800 Pure hypercholesterolemia, unspecified: Secondary | ICD-10-CM | POA: Diagnosis not present

## 2021-01-19 DIAGNOSIS — M800AXD Age-related osteoporosis with current pathological fracture, other site, subsequent encounter for fracture with routine healing: Secondary | ICD-10-CM | POA: Diagnosis not present

## 2021-01-19 DIAGNOSIS — I480 Paroxysmal atrial fibrillation: Secondary | ICD-10-CM | POA: Diagnosis not present

## 2021-01-19 DIAGNOSIS — D509 Iron deficiency anemia, unspecified: Secondary | ICD-10-CM | POA: Diagnosis not present

## 2021-01-27 DIAGNOSIS — I110 Hypertensive heart disease with heart failure: Secondary | ICD-10-CM | POA: Diagnosis not present

## 2021-01-27 DIAGNOSIS — E7800 Pure hypercholesterolemia, unspecified: Secondary | ICD-10-CM | POA: Diagnosis not present

## 2021-01-27 DIAGNOSIS — M800AXD Age-related osteoporosis with current pathological fracture, other site, subsequent encounter for fracture with routine healing: Secondary | ICD-10-CM | POA: Diagnosis not present

## 2021-01-27 DIAGNOSIS — E782 Mixed hyperlipidemia: Secondary | ICD-10-CM | POA: Diagnosis not present

## 2021-01-27 DIAGNOSIS — I480 Paroxysmal atrial fibrillation: Secondary | ICD-10-CM | POA: Diagnosis not present

## 2021-01-27 DIAGNOSIS — I509 Heart failure, unspecified: Secondary | ICD-10-CM | POA: Diagnosis not present

## 2021-01-30 DIAGNOSIS — N390 Urinary tract infection, site not specified: Secondary | ICD-10-CM | POA: Diagnosis not present

## 2021-01-30 DIAGNOSIS — N3281 Overactive bladder: Secondary | ICD-10-CM | POA: Diagnosis not present

## 2021-02-03 DIAGNOSIS — I110 Hypertensive heart disease with heart failure: Secondary | ICD-10-CM | POA: Diagnosis not present

## 2021-02-03 DIAGNOSIS — E7800 Pure hypercholesterolemia, unspecified: Secondary | ICD-10-CM | POA: Diagnosis not present

## 2021-02-03 DIAGNOSIS — I480 Paroxysmal atrial fibrillation: Secondary | ICD-10-CM | POA: Diagnosis not present

## 2021-02-03 DIAGNOSIS — E782 Mixed hyperlipidemia: Secondary | ICD-10-CM | POA: Diagnosis not present

## 2021-02-03 DIAGNOSIS — M800AXD Age-related osteoporosis with current pathological fracture, other site, subsequent encounter for fracture with routine healing: Secondary | ICD-10-CM | POA: Diagnosis not present

## 2021-02-03 DIAGNOSIS — I509 Heart failure, unspecified: Secondary | ICD-10-CM | POA: Diagnosis not present

## 2021-02-08 DIAGNOSIS — M800AXD Age-related osteoporosis with current pathological fracture, other site, subsequent encounter for fracture with routine healing: Secondary | ICD-10-CM | POA: Diagnosis not present

## 2021-02-08 DIAGNOSIS — I110 Hypertensive heart disease with heart failure: Secondary | ICD-10-CM | POA: Diagnosis not present

## 2021-02-08 DIAGNOSIS — E782 Mixed hyperlipidemia: Secondary | ICD-10-CM | POA: Diagnosis not present

## 2021-02-08 DIAGNOSIS — I509 Heart failure, unspecified: Secondary | ICD-10-CM | POA: Diagnosis not present

## 2021-02-08 DIAGNOSIS — E7800 Pure hypercholesterolemia, unspecified: Secondary | ICD-10-CM | POA: Diagnosis not present

## 2021-02-08 DIAGNOSIS — I480 Paroxysmal atrial fibrillation: Secondary | ICD-10-CM | POA: Diagnosis not present

## 2021-02-13 DIAGNOSIS — R202 Paresthesia of skin: Secondary | ICD-10-CM | POA: Diagnosis not present

## 2021-02-13 DIAGNOSIS — I10 Essential (primary) hypertension: Secondary | ICD-10-CM | POA: Diagnosis not present

## 2021-02-13 DIAGNOSIS — I872 Venous insufficiency (chronic) (peripheral): Secondary | ICD-10-CM | POA: Diagnosis not present

## 2021-02-17 DIAGNOSIS — I480 Paroxysmal atrial fibrillation: Secondary | ICD-10-CM | POA: Diagnosis not present

## 2021-02-17 DIAGNOSIS — I509 Heart failure, unspecified: Secondary | ICD-10-CM | POA: Diagnosis not present

## 2021-02-17 DIAGNOSIS — I110 Hypertensive heart disease with heart failure: Secondary | ICD-10-CM | POA: Diagnosis not present

## 2021-02-17 DIAGNOSIS — M800AXD Age-related osteoporosis with current pathological fracture, other site, subsequent encounter for fracture with routine healing: Secondary | ICD-10-CM | POA: Diagnosis not present

## 2021-02-17 DIAGNOSIS — E782 Mixed hyperlipidemia: Secondary | ICD-10-CM | POA: Diagnosis not present

## 2021-02-17 DIAGNOSIS — E7800 Pure hypercholesterolemia, unspecified: Secondary | ICD-10-CM | POA: Diagnosis not present

## 2021-03-03 ENCOUNTER — Other Ambulatory Visit: Payer: Self-pay | Admitting: Cardiology

## 2021-03-06 ENCOUNTER — Ambulatory Visit: Payer: Medicare Other | Admitting: Cardiology

## 2021-03-19 NOTE — Progress Notes (Signed)
Cardiology Office Note:    Date:  03/22/2021   ID:  [name], DOB Nov 24, 1935, MRN [id number]  PCP:  Kirby FunkGriffin, John, MD   [facility] Providers Cardiologist:  Tobias AlexanderKatarina Nelson, MD (Inactive) {  Referring MD: Kirby FunkGriffin, John, MD    History of Present Illness:    Annette DankerRose Marie C [name] is a 85 y.o. female with a hx of pAfib, chronic diastolic CHF, hypertension, HLD, thoracic aneurysm previously followed by Dr. Zenaida NieceVan Trigt-not felt to be a candidate for repair given severe kyphosis who was previously followed by Dr. Delton SeeNelson who now returns to clinic for follow-up.  Per review of the record, TTE 11/28/2016 with LVEF 55 to 60%, mild to moderate AI, moderate MR, severe atrial enlargement, severe TR.  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 Also with paroxysmal Afib s/p spontaneously conversion to normal sinus rhythm on diltiazem and flecainide.  Myoview 01/2017 poor quality study but low risk EF 72%.  Echo 07/2018 EF 55 to 60% with grade 1 DD mildly dilated ascending aorta with moderate MVP with MR and moderate to severe dilated LA moderate TR moderate pulmonary hypertension.  Last saw Jacolyn ReedyMichele Lenze on 01/27/20 where she was suffering from a leg wound. Stable from a CV standpoint at that time. She now returns for follow-up.  Today, the patient state she feels well. Has been started on lasix and potassium supplementation for lower extremity swelling by her PCP which has helped. No shortness of breath, orthopnea, PND. No chest pain, palpitations, lightheadedness or dizziness. Tolerating apixaban without issues. Will be following with Dr. Vickey SagesAtkins for thoracic aortic aneurysm.  Past Medical History:  Diagnosis Date  . Anxiety   . Aortic regurgitation   . Breast cancer (HCC) 08/20/2011   R breast DCIS, ER/PR +  . Cataract 3 and 10/92   bilateral  . Chronic diastolic CHF (congestive heart failure) (HCC)   . Compression fracture of  fourth lumbar vertebra (HCC)   . DVT (deep venous thrombosis) (HCC) 08/2011   LL extremity   . Fibromyalgia   . Fracture lumbar vertebra-closed (HCC)   . Fracture of thoracic vertebra, closed (HCC)   . GERD (gastroesophageal reflux disease)   . H/O hiatal hernia   . History of blood clots   . History of radiation therapy 01/2012   R breast  . Hypercholesteremia   . Hypertension    DR Terrilee FilesJ JENKINS  . Hypothyroidism   . Mitral regurgitation   . Mitral valve prolapse   . Osteoporosis   . PAF (paroxysmal atrial fibrillation) (HCC)    a. dx 11/2016.  . Pulmonary hypertension (HCC)   . Rib fractures   . Thoracic ascending aortic aneurysm (HCC)    a. followed by Dr. Donata ClayVan Trigt.  . Tricuspid regurgitation     Past Surgical History:  Procedure Laterality Date  . ANTERIOR CERVICAL DECOMP/DISCECTOMY FUSION N/A 09/09/2015   Procedure: Anterior Cervical Decompression and Fusion Cervical seven-Thorasic one ;  Surgeon: Maeola HarmanJoseph Stern, MD;  Location: [facility];  Service: Neurosurgery;  Laterality: N/A;  . APPENDECTOMY  1940  . BREAST SURGERY    . CATARACT EXTRACTION  1992  . EYE SURGERY  1940, 1956  . HERNIA REPAIR  10/16/2006   [facility] - Dr Daphine DeutscherMartin  . KYPHOPLASTY N/A 01/26/2013   Procedure: KYPHOPLASTY;  Surgeon: Barnett AbuHenry Elsner, MD;  Location: [facility];  Service: Neurosurgery;  Laterality: N/A;  T11 and  L1  . KYPHOPLASTY N/A 06/21/2017   Procedure: Lumbar four Kyphoplasty;  Surgeon: Erline Levine, MD;  Location: [place];  Service: Neurosurgery;  Laterality: N/A;  . MASTECTOMY, PARTIAL  10/17/2011   Procedure: MASTECTOMY PARTIAL;  Surgeon: Haywood Lasso, MD;  Location: [place];  Service: General;  Laterality: Right;  needle guided  . TEE WITHOUT CARDIOVERSION N/A 12/04/2016   Procedure: TRANSESOPHAGEAL ECHOCARDIOGRAM (TEE);  Surgeon: Pixie Casino, MD;  Location[facility];  Service: Cardiovascular;  Laterality: N/A;  . TONSILLECTOMY  1944    Current Medications: Current Meds  Medication  Sig  . BONIVA 150 MG tablet Take 150 mg by mouth every 30 (thirty) days.   . calcium citrate-vitamin D (CITRACAL+D) 315-200 MG-UNIT per tablet Take 1 tablet by mouth 2 (two) times daily.  . Camphor-Eucalyptus-Menthol (VICKS VAPORUB EX) Apply 1 application topically daily as needed (toenail fungus).   . cholecalciferol (VITAMIN D) 1000 UNITS tablet Take 1,000 Units by mouth daily with lunch. Vitamin D3  . conjugated estrogens (PREMARIN) vaginal cream Place 1 Applicatorful vaginally 2 (two) times a week.  . diltiazem (CARDIZEM CD) 180 MG 24 hr capsule TAKE (1) CAPSULE DAILY.  Marland Kitchen ELIQUIS 2.5 MG TABS tablet TAKE 1 TABLET BY MOUTH TWICE DAILY.  . furosemide (LASIX) 20 MG tablet Take 20 mg by mouth daily.  Marland Kitchen HYDROcodone-acetaminophen (NORCO/VICODIN) 5-325 MG tablet Take 1-2 tablets by mouth every 4 (four) hours as needed (mild pain).  . iron polysaccharides (NIFEREX) 150 MG capsule Take 150 mg by mouth 3 (three) times a week.   . levothyroxine (SYNTHROID, LEVOTHROID) 125 MCG tablet Take 125 mcg by mouth daily before breakfast.  . losartan (COZAAR) 25 MG tablet Take 1 tablet (25 mg total) by mouth daily. Please keep upcoming appt in April 2022 with Dr. Meda Coffee before anymore refills. Thank  You  . Multiple Vitamin (MULTIVITAMIN) tablet Take 1 tablet by mouth daily with breakfast.  . omeprazole (PRILOSEC OTC) 20 MG tablet Take 20 mg by mouth daily before breakfast.   . potassium chloride (KLOR-CON) 10 MEQ tablet Take 10 mEq by mouth daily.  . pravastatin (PRAVACHOL) 20 MG tablet Take 20 mg by mouth daily with supper.   . solifenacin (VESICARE) 5 MG tablet Take 5 mg by mouth daily with breakfast.   . [DISCONTINUED] flecainide (TAMBOCOR) 50 MG tablet Take 1 tablet (50 mg total) by mouth 2 (two) times daily. Pt needs to keep appt in May with provider for further refills     Allergies:   Contrast media [iodinated diagnostic agents], Iohexol, Mucinex [guaifenesin er], Zanaflex [tizanidine hcl], Penicillins,  and Robaxin [methocarbamol]   Social History   Socioeconomic History  . Marital status: Married    Spouse name: Not on file  . Number of children: Not on file  . Years of education: Not on file  . Highest education level: Not on file  Occupational History  . Not on file  Tobacco Use  . Smoking status: Never Smoker  . Smokeless tobacco: Never Used  Vaping Use  . Vaping Use: Never used  Substance and Sexual Activity  . Alcohol use: No  . Drug use: No  . Sexual activity: Never  Other Topics Concern  . Not on file  Social History Narrative  . Not on file   Social Determinants of Health   Financial Resource Strain: Not on file  Food Insecurity: Not on file  Transportation Needs: Not on file  Physical Activity: Not on file  Stress: Not on file  Social Connections: Not on file     Family History: The patient's family history includes Heart disease in her maternal grandfather, maternal uncle, and mother.  ROS:   Please see the history of present illness.    Review of Systems  Constitutional: Negative for chills and fever.  HENT: Negative for sore throat.   Eyes: Negative for blurred vision.  Respiratory: Negative for shortness of breath.   Cardiovascular: Positive for leg swelling. Negative for chest pain, palpitations, orthopnea, claudication and PND.  Gastrointestinal: Negative for nausea and vomiting.  Genitourinary: Negative for hematuria.  Musculoskeletal: Positive for back pain, joint pain and myalgias.  Neurological: Negative for dizziness and loss of consciousness.  Endo/Heme/Allergies: Negative for polydipsia.  Psychiatric/Behavioral: Negative for substance abuse.    EKGs/Labs/Other Studies Reviewed:    The following studies were reviewed today: 2D echo 9/25/2019Study Conclusions   - Left ventricle: The cavity size was normal. There was severe  focal basal hypertrophy. Systolic function was normal. The  estimated ejection fraction was in the range of  55% to 60%. Wall  motion was normal; there were no regional wall motion  abnormalities. There was an increased relative contribution of  atrial contraction to ventricular filling. Doppler parameters are  consistent with abnormal left ventricular relaxation (grade 1  diastolic dysfunction). Doppler parameters are consistent with  high ventricular filling pressure.  - Aortic valve: Trileaflet; mildly thickened, mildly calcified  leaflets. There was mild regurgitation.  - Aorta: Ascending aorta diameter: 41 mm (ED).  - Ascending aorta: The ascending aorta was mildly dilated.  - Mitral valve: Mild diffuse thickening, consistent with myxomatous  proliferation. Moderate, holosystolic prolapse, involving the  posterior > anterior leaflet. There was moderate regurgitation.  Effective regurgitant orifice (PISA): 0.15 cm^2. Regurgitant  volume (PISA): 32 ml.  - Left atrium: The atrium was moderately to severely dilated.  - Tricuspid valve: There was moderate regurgitation.  - Pulmonary arteries: PA peak pressure: 45 mm Hg (S).   Impressions:   - The right ventricular systolic pressure was increased consistent  with moderate pulmonary hypertension.  Myoview 01/2017:  The nuclear study is very poor quality due to body habitus  Defect 1: There is a defect present in the basal inferolateral and mid inferolateral location that is most likely artifact due to uptake in structures below the diaphragm.  This is a low risk study.  Nuclear stress EF: 72%. The left ventricular ejection fraction is hyperdynamic (>65%).     EKG:  EKG is  ordered today.  The ekg ordered today demonstrates NSR, LAFB, anterior infarct (unchanged), first degree AVB, HR 65  Recent Labs: No results found for requested labs within last 8760 hours.  Recent Lipid Panel    Component Value Date/Time   CHOL  12/02/2010 0400    149        ATP III CLASSIFICATION:  <200     mg/dL   Desirable  200-239   mg/dL   Borderline High  >=240    mg/dL   High          TRIG 54 12/02/2010 0400   HDL 58 12/02/2010 0400   CHOLHDL 2.6 12/02/2010 0400   VLDL 11 12/02/2010 0400   LDLCALC  12/02/2010 0400    80        Total Cholesterol/HDL:CHD Risk Coronary Heart Disease Risk Table                     Men   Women  1/2 Average  Risk   3.4   3.3  Average Risk       5.0   4.4  2 X Average Risk   9.6   7.1  3 X Average Risk  23.4   11.0        Use the calculated Patient Ratio above and the CHD Risk Table to determine the patient's CHD Risk.        ATP III CLASSIFICATION (LDL):  <100     mg/dL   Optimal  100-129  mg/dL   Near or Above                    Optimal  130-159  mg/dL   Borderline  160-189  mg/dL   High  >190     mg/dL   Very High     Risk Assessment/Calculations:    CHA2DS2-VASc Score = 6  This indicates a 9.7% annual risk of stroke. The patient's score is based upon: CHF History: Yes HTN History: Yes Diabetes History: No Stroke History: No Vascular Disease History: Yes Age Score: 2 Gender Score: 1    Physical Exam:    VS:  BP 132/74   Pulse 65   Ht 4\' 11"  (1.499 m)   Wt 107 lb 3.2 oz (48.6 kg)   SpO2 95%   BMI 21.65 kg/m     Wt Readings from Last 3 Encounters:  03/22/21 107 lb 3.2 oz (48.6 kg)  05/04/20 111 lb (50.3 kg)  01/27/20 111 lb (50.3 kg)     GEN: Elderly female, comfortable HEENT: Left eye strabismus NECK: No JVD; No carotid bruits CARDIAC: RRR, 2/6 systolic murmur. No rubs, gallops RESPIRATORY:  Clear to auscultation without rales, wheezing or rhonchi  ABDOMEN: Soft, non-tender, non-distended MUSCULOSKELETAL:  Trace pedal edema; warm. Has scoliosis SKIN: Warm and dry NEUROLOGIC:  Alert and oriented x 3 PSYCHIATRIC:  Normal affect   ASSESSMENT:    1. Paroxysmal atrial fibrillation (HCC)   2. Mitral valve insufficiency, unspecified etiology   3. Chronic diastolic CHF (congestive heart failure) (HCC)   4. Valvular heart disease   5. PAF  (paroxysmal atrial fibrillation) ([place])   6. Thoracic aortic aneurysm without rupture ([place])   7. Hyperlipidemia, unspecified hyperlipidemia type    PLAN:    In order of problems listed above:  #Paroxysmal Afib: CHADs-vasc 6. On flecainide and Eliquis. No bleeding issues. -Continue flecainide and apixaban for [facility] -Continue diltiazem 180mg  daily  #Chronic diastolic CHF: Echo 03/7845 LVEF 55 to 60% with grade 1 DD.  Appears euvolemic today. -Continue lasix 20mg  daily -Continue losartan 25mg  daily -Repeat BMET at PCP office in next week  #Thoracic aortic aneurysm: Previously followed by Dr. Prescott Gum now transitioned to Dr. Orvan Seen. Managed conservatively as deemed to be not a surgical candidate -Continue losartan 25mg  daily -Continue pravsatatin 20mg   #Valvular heart disease with moderate MVP and MR moderate TR mild AI and moderate pulmonary hypertension: -Repeat TTE for serial monitoring -Volume management with lasix as above  #Hyperlipidemia: -Continue pravasatin 20mg  daily       Medication Adjustments/Labs and Tests Ordered: Current medicines are reviewed at length with the patient today.  Concerns regarding medicines are outlined above.  Orders Placed This Encounter  Procedures  . EKG 12-Lead  . ECHOCARDIOGRAM COMPLETE   Meds ordered this encounter  Medications  . flecainide (TAMBOCOR) 50 MG tablet    Sig: Take 1 tablet (50 mg total) by mouth 2 (two) times daily.    Dispense:  180  tablet    Refill:  1    Patient Instructions  Medication Instructions:   Your physician recommends that you continue on your current medications as directed. Please refer to the Current Medication list given to you today.  *If you need a refill on your cardiac medications before your next appointment, please call your pharmacy*   Testing/Procedures:  Your physician has requested that you have an echocardiogram. Echocardiography is a painless test that uses sound waves to create  images of your heart. It provides your doctor with information about the size and shape of your heart and how well your heart's chambers and valves are working. This procedure takes approximately one hour. There are no restrictions for this procedure.  Follow-Up: At [facility], you and your health needs are our priority.  As part of our continuing mission to provide you with exceptional heart care, we have created designated Provider Care Teams.  These Care Teams include your primary Cardiologist (physician) and Advanced Practice Providers (APPs -  Physician Assistants and Nurse Practitioners) who all work together to provide you with the care you need, when you need it.  We recommend signing up for the patient portal called "MyChart".  Sign up information is provided on this After Visit Summary.  MyChart is used to connect with patients for Virtual Visits (Telemedicine).  Patients are able to view lab/test results, encounter notes, upcoming appointments, etc.  Non-urgent messages can be sent to your provider as well.   To learn more about what you can do with MyChart, go to [email or web address].    Your next appointment:   6 month(s)  The format for your next appointment:   In Person  Provider:   You will see one of the following Advanced Practice Providers on your designated Care Team:    Richardson Dopp, PA-C  [place], [place]  DAYNA DUNN PA-C  [place] PA-C  Cecilie Kicks NP  JILL MCDANIEL        Signed, Freada Bergeron, MD  03/22/2021 12:58 PM    [place]

## 2021-03-22 ENCOUNTER — Other Ambulatory Visit: Payer: Self-pay

## 2021-03-22 ENCOUNTER — Encounter: Payer: Self-pay | Admitting: Cardiology

## 2021-03-22 ENCOUNTER — Ambulatory Visit (INDEPENDENT_AMBULATORY_CARE_PROVIDER_SITE_OTHER): Payer: Medicare Other | Admitting: Cardiology

## 2021-03-22 VITALS — BP 132/74 | HR 65 | Ht 59.0 in | Wt 107.2 lb

## 2021-03-22 DIAGNOSIS — I480 Paroxysmal atrial fibrillation: Secondary | ICD-10-CM | POA: Diagnosis not present

## 2021-03-22 DIAGNOSIS — I712 Thoracic aortic aneurysm, without rupture, unspecified: Secondary | ICD-10-CM

## 2021-03-22 DIAGNOSIS — E785 Hyperlipidemia, unspecified: Secondary | ICD-10-CM | POA: Diagnosis not present

## 2021-03-22 DIAGNOSIS — I340 Nonrheumatic mitral (valve) insufficiency: Secondary | ICD-10-CM

## 2021-03-22 DIAGNOSIS — I5032 Chronic diastolic (congestive) heart failure: Secondary | ICD-10-CM | POA: Diagnosis not present

## 2021-03-22 DIAGNOSIS — I38 Endocarditis, valve unspecified: Secondary | ICD-10-CM | POA: Diagnosis not present

## 2021-03-22 MED ORDER — FLECAINIDE ACETATE 50 MG PO TABS: 50.0000 mg | ORAL_TABLET | Freq: Two times a day (BID) | ORAL | 1 refills | Status: DC

## 2021-03-22 NOTE — Patient Instructions (Signed)
Medication Instructions:   Your physician recommends that you continue on your current medications as directed. Please refer to the Current Medication list given to you today.  *If you need a refill on your cardiac medications before your next appointment, please call your pharmacy*   Testing/Procedures:  Your physician has requested that you have an echocardiogram. Echocardiography is a painless test that uses sound waves to create images of your heart. It provides your doctor with information about the size and shape of your heart and how well your heart's chambers and valves are working. This procedure takes approximately one hour. There are no restrictions for this procedure.  Follow-Up: At [facility], you and your health needs are our priority.  As part of our continuing mission to provide you with exceptional heart care, we have created designated Provider Care Teams.  These Care Teams include your primary Cardiologist (physician) and Advanced Practice Providers (APPs -  Physician Assistants and Nurse Practitioners) who all work together to provide you with the care you need, when you need it.  We recommend signing up for the patient portal called "MyChart".  Sign up information is provided on this After Visit Summary.  MyChart is used to connect with patients for Virtual Visits (Telemedicine).  Patients are able to view lab/test results, encounter notes, upcoming appointments, etc.  Non-urgent messages can be sent to your provider as well.   To learn more about what you can do with MyChart, go to [email or web address].    Your next appointment:   6 month(s)  The format for your next appointment:   In Person  Provider:   You will see one of the following Advanced Practice Providers on your designated Care Team:    Richardson Dopp, PA-C  [place]in [place], PA-C  DAYNA DUNN PA-C  [facility] LENZE PA-C  Cecilie Kicks NP  JILL MCDANIEL

## 2021-03-27 DIAGNOSIS — R202 Paresthesia of skin: Secondary | ICD-10-CM | POA: Diagnosis not present

## 2021-03-27 DIAGNOSIS — R29898 Other symptoms and signs involving the musculoskeletal system: Secondary | ICD-10-CM | POA: Diagnosis not present

## 2021-03-29 ENCOUNTER — Other Ambulatory Visit: Payer: Self-pay | Admitting: *Deleted

## 2021-03-29 MED ORDER — DILTIAZEM HCL ER COATED BEADS 180 MG PO CP24: ORAL_CAPSULE | ORAL | 1 refills | Status: DC

## 2021-03-29 MED ORDER — LOSARTAN POTASSIUM 25 MG PO TABS: 25.0000 mg | ORAL_TABLET | Freq: Every day | ORAL | 1 refills | Status: DC

## 2021-04-07 DIAGNOSIS — S22000A Wedge compression fracture of unspecified thoracic vertebra, initial encounter for closed fracture: Secondary | ICD-10-CM | POA: Diagnosis not present

## 2021-04-07 DIAGNOSIS — M5416 Radiculopathy, lumbar region: Secondary | ICD-10-CM | POA: Diagnosis not present

## 2021-04-07 DIAGNOSIS — I10 Essential (primary) hypertension: Secondary | ICD-10-CM | POA: Diagnosis not present

## 2021-04-07 DIAGNOSIS — M4120 Other idiopathic scoliosis, site unspecified: Secondary | ICD-10-CM | POA: Diagnosis not present

## 2021-04-07 DIAGNOSIS — M545 Low back pain, unspecified: Secondary | ICD-10-CM | POA: Diagnosis not present

## 2021-04-07 DIAGNOSIS — M5412 Radiculopathy, cervical region: Secondary | ICD-10-CM | POA: Diagnosis not present

## 2021-04-07 DIAGNOSIS — M542 Cervicalgia: Secondary | ICD-10-CM | POA: Diagnosis not present

## 2021-04-07 DIAGNOSIS — M810 Age-related osteoporosis without current pathological fracture: Secondary | ICD-10-CM | POA: Diagnosis not present

## 2021-04-17 ENCOUNTER — Other Ambulatory Visit: Payer: Self-pay | Admitting: *Deleted

## 2021-04-17 MED ORDER — APIXABAN 2.5 MG PO TABS: 2.5000 mg | ORAL_TABLET | Freq: Two times a day (BID) | ORAL | 5 refills | Status: DC

## 2021-04-17 NOTE — Telephone Encounter (Signed)
Prescription refill request for Eliquis received. Indication: afib  Last office visit: [place], 03/22/2021 Scr: 0.79, 02/13/2021 Age: 85 yo  Weight: 48.6 kg   Pt is on the correct dose of Eliquis per dosing criteria, prescription refill sent for Eliquis 2.5mg  BID.

## 2021-04-21 ENCOUNTER — Other Ambulatory Visit: Payer: Self-pay

## 2021-04-21 ENCOUNTER — Ambulatory Visit (HOSPITAL_COMMUNITY): Payer: Medicare Other | Attending: Cardiology

## 2021-04-21 DIAGNOSIS — I340 Nonrheumatic mitral (valve) insufficiency: Secondary | ICD-10-CM | POA: Diagnosis not present

## 2021-04-21 LAB — ECHOCARDIOGRAM COMPLETE
Area-P 1/2: 4.54 cm2
MV M vel: 5.92 m/s
MV Peak grad: 140.2 mmHg
P 1/2 time: 535 msec
Radius: 0.9 cm
S' Lateral: 2.7 cm

## 2021-04-24 NOTE — Progress Notes (Signed)
Cardiology Office Note    Date:  04/26/2021   ID:  [name], DOB Dec 06, 1935, MRN [id number]   PCP:  [name], MD   [place]  Cardiologist:  Freada Bergeron, MD   Advanced Practice Provider:  No care team member to display Electrophysiologist:  None   [id number]}   Chief Complaint  Patient presents with   Follow-up     History of Present Illness:  [name] is a 85 y.o. female with a hx of pAfib, chronic diastolic CHF, hypertension, HLD, thoracic aneurysm previously followed by Dr. Bing Ree felt to be a candidate for repair given severe kyphosis.    Patient saw Dr. Johney Frame 03/2021 at which time she was started on Lasix and potassium for lower extremity edema by PCP.  No shortness of breath or chest pain.  An echo was ordered and performed 04/21/2021 which showed severe MR and TEE was recommended for further evaluation.  Patient comes in today to discuss TEE.  She has many questions that were answered in detail.  Her edema has resolved on low-dose Lasix.  She says her primary care did follow-up labs and they were all stable.  She denies shortness of breath or chest pain.  She would like to discuss with Dr. Julien Girt next week before agreeing to TEE.  Past Medical History:  Diagnosis Date   Anxiety    Aortic regurgitation    Breast cancer ([place]) 08/20/2011   R breast DCIS, ER/PR +   Cataract 3 and 10/92   bilateral   Chronic diastolic CHF (congestive heart failure) (HCC)    Compression fracture of fourth lumbar vertebra (HCC)    DVT (deep venous thrombosis) ([place]) 08/2011   LL extremity    Fibromyalgia    Fracture lumbar vertebra-closed (HCC)    Fracture of thoracic vertebra, closed (HCC)    GERD (gastroesophageal reflux disease)    H/O hiatal hernia    History of blood clots    History of radiation therapy 01/2012   R breast   Hypercholesteremia    Hypertension    DR Orinda Kenner   Hypothyroidism    Mitral regurgitation     Mitral valve prolapse    Osteoporosis    PAF (paroxysmal atrial fibrillation) ([place])    a. dx 11/2016.   Pulmonary hypertension ([place])    Rib fractures    Thoracic ascending aortic aneurysm ([place])    a. followed by Dr. Prescott Gum.   Tricuspid regurgitation     Past Surgical History:  Procedure Laterality Date   ANTERIOR CERVICAL DECOMP/DISCECTOMY FUSION N/A 09/09/2015   Procedure: Anterior Cervical Decompression and Fusion Cervical seven-Thorasic one ;  Surgeon: Erline Levine, MD;  Location: [facility];  Service: Neurosurgery;  Laterality: N/A;   [place]  10/16/2006   RIH - Dr Hassell Done   KYPHOPLASTY N/A 01/26/2013   Procedure: KYPHOPLASTY;  Surgeon: Kristeen Miss, MD;  Location: [facility];  Service: Neurosurgery;  Laterality: N/A;  T11 and L1   KYPHOPLASTY N/A 06/21/2017   Procedure: Lumbar four Kyphoplasty;  Surgeon: Erline Levine, MD;  Location: [facility];  Service: Neurosurgery;  Laterality: N/A;   MASTECTOMY, PARTIAL  10/17/2011   Procedure: MASTECTOMY PARTIAL;  Surgeon: Haywood Lasso, MD;  Location: [facility];  Service: General;  Laterality: Right;  needle guided  TEE WITHOUT CARDIOVERSION N/A 12/04/2016   Procedure: TRANSESOPHAGEAL ECHOCARDIOGRAM (TEE);  Surgeon: Pixie Casino, MD;  [facility];  Service: Cardiovascular;  Laterality: N/A;   TONSILLECTOMY  1944    Current Medications: Current Meds  Medication Sig   apixaban (ELIQUIS) 2.5 MG TABS tablet Take 1 tablet (2.5 mg total) by mouth 2 (two) times daily.   BONIVA 150 MG tablet Take 150 mg by mouth every 30 (thirty) days.    calcium citrate-vitamin D (CITRACAL+D) 315-200 MG-UNIT per tablet Take 1 tablet by mouth 2 (two) times daily.   Camphor-Eucalyptus-Menthol (VICKS VAPORUB EX) Apply 1 application topically daily as needed (toenail fungus).    cholecalciferol (VITAMIN D) 1000 UNITS tablet Take 1,000 Units by  mouth daily with lunch. Vitamin D3   conjugated estrogens (PREMARIN) vaginal cream Place 1 Applicatorful vaginally 2 (two) times a week.   diltiazem (CARDIZEM CD) 180 MG 24 hr capsule Take 1 capsule daily   flecainide (TAMBOCOR) 50 MG tablet Take 1 tablet (50 mg total) by mouth 2 (two) times daily.   furosemide (LASIX) 20 MG tablet Take 20 mg by mouth daily.   HYDROcodone-acetaminophen (NORCO/VICODIN) 5-325 MG tablet Take 1-2 tablets by mouth every 4 (four) hours as needed (mild pain).   iron polysaccharides (NIFEREX) 150 MG capsule Take 150 mg by mouth 3 (three) times a week.    levothyroxine (SYNTHROID, LEVOTHROID) 125 MCG tablet Take 125 mcg by mouth daily before breakfast.   losartan (COZAAR) 25 MG tablet Take 1 tablet (25 mg total) by mouth daily. Please keep upcoming appt in April 2022 with Dr. Meda Coffee before anymore refills. Thank  You   Multiple Vitamin (MULTIVITAMIN) tablet Take 1 tablet by mouth daily with breakfast.   omeprazole (PRILOSEC OTC) 20 MG tablet Take 20 mg by mouth daily before breakfast.    potassium chloride (KLOR-CON) 10 MEQ tablet Take 10 mEq by mouth daily.   pravastatin (PRAVACHOL) 20 MG tablet Take 20 mg by mouth daily with supper.    solifenacin (VESICARE) 5 MG tablet Take 5 mg by mouth daily with breakfast.      Allergies:   Contrast media [iodinated diagnostic agents], Iohexol, Mucinex [guaifenesin er], Zanaflex [tizanidine hcl], Penicillins, and Robaxin [methocarbamol]   Social History   Socioeconomic History   Marital status: Married    Spouse name: Not on file   Number of children: Not on file   Years of education: Not on file   Highest education level: Not on file  Occupational History   Not on file  Tobacco Use   Smoking status: Never   Smokeless tobacco: Never  Vaping Use   Vaping Use: Never used  Substance and Sexual Activity   Alcohol use: No   Drug use: No   Sexual activity: Never  Other Topics Concern   Not on file  Social History  Narrative   Not on file   Social Determinants of Health   Financial Resource Strain: Not on file  Food Insecurity: Not on file  Transportation Needs: Not on file  Physical Activity: Not on file  Stress: Not on file  Social Connections: Not on file     Family History:  The patient's  family history includes Heart disease in her maternal grandfather, maternal uncle, and mother.   ROS:   Please see the history of present illness.    ROS All other systems reviewed and are negative.   PHYSICAL EXAM:   VS:  BP 126/70   Pulse 100  Ht 4\' 11"  (1.499 m)   Wt 105 lb 12.8 oz (48 kg)   SpO2 94%   BMI 21.37 kg/m   Physical Exam  GEN: Thin, elderly , in no acute distress  Neck: no JVD, carotid bruits, or masses Cardiac:RRR; 3/6 systolic murmur at the apex Respiratory:  clear to auscultation bilaterally, normal work of breathing GI: soft, nontender, nondistended, + BS Ext: without cyanosis, clubbing, or edema, Good distal pulses bilaterally Neuro:  Alert and Oriented x 3 Psych: euthymic mood, full affect  Wt Readings from Last 3 Encounters:  04/26/21 105 lb 12.8 oz (48 kg)  03/22/21 107 lb 3.2 oz (48.6 kg)  05/04/20 111 lb (50.3 kg)      Studies/Labs Reviewed:   EKG:  EKG is not ordered today.     Recent Labs: No results found for requested labs within last 8760 hours.   Lipid Panel    Component Value Date/Time   CHOL  12/02/2010 0400    149        ATP III CLASSIFICATION:  <200     mg/dL   Desirable  200-239  mg/dL   Borderline High  >=240    mg/dL   High          TRIG 54 12/02/2010 0400   HDL 58 12/02/2010 0400   CHOLHDL 2.6 12/02/2010 0400   VLDL 11 12/02/2010 0400   LDLCALC  12/02/2010 0400    80        Total Cholesterol/HDL:CHD Risk Coronary Heart Disease Risk Table                     Men   Women  1/2 Average Risk   3.4   3.3  Average Risk       5.0   4.4  2 X Average Risk   9.6   7.1  3 X Average Risk  23.4   11.0        Use the calculated Patient  Ratio above and the CHD Risk Table to determine the patient's CHD Risk.        ATP III CLASSIFICATION (LDL):  <100     mg/dL   Optimal  100-129  mg/dL   Near or Above                    Optimal  130-159  mg/dL   Borderline  160-189  mg/dL   High  >190     mg/dL   Very High    Additional studies/ records that were reviewed today include:  2D echo 04/21/2021 IMPRESSIONS     1. Left ventricular ejection fraction, by estimation, is 60 to 65%. The  left ventricle has normal function. The left ventricle has no regional  wall motion abnormalities. There is moderate asymmetric hypertrophy of the  basal septum. The rest of the LV  segments demonstrate mild left ventricular hypertrophy. Left ventricular  diastolic parameters are consistent with Grade I diastolic dysfunction  (impaired relaxation).   2. Right ventricular systolic function is normal. The right ventricular  size is normal. There is mildly elevated pulmonary artery systolic  pressure. The estimated right ventricular systolic pressure is 18.2 mmHg.   3. Left atrial size was severely dilated.   4. The mitral valve is myxomatous. There is moderate holosystolic  prolapse of of the mitral valve most notably of the posterior mitral valve  leaflet with possible P2 flail. Using a PISA radius of 0.9, there is at  least moderate-to-severe mitral  regurgitation with EROA 0.3cm2, RVol 85mL. Recommend TEE for further  evaluation.   5. Tricuspid valve regurgitation is moderate.   6. The aortic valve is tricuspid. There is mild calcification of the  aortic valve. There is mild thickening of the aortic valve. Aortic valve  regurgitation is mild to moderate. Mild to moderate aortic valve  sclerosis/calcification is present, without  any evidence of aortic stenosis.   7. Aortic dilatation noted. There is mild dilatation of the aortic root,  measuring 39 mm. There is moderate-to-severe dilatation of the ascending  aorta, measuring 50 mm.    8. The inferior vena cava is normal in size with greater than 50%  respiratory variability, suggesting right atrial pressure of 3 mmHg.   9. Right atrial size was mildly dilated.   Comparison(s): Compared to prior echo, the MR now appears severe. The  ascending aorta now measures 33mm (previously 39mm).   Risk Assessment/Calculations:      CHA2DS2-VASc Score = 6  This indicates a 9.7% annual risk of stroke. The patient's score is based upon: CHF History: Yes HTN History: Yes Diabetes History: No Stroke History: No Vascular Disease History: Yes Age Score: 2 Gender Score: 1      ASSESSMENT:    1. Mitral valve insufficiency, unspecified etiology   2. PAF (paroxysmal atrial fibrillation) ([place])   3. Chronic diastolic CHF (congestive heart failure) ([place])   4. Thoracic aortic aneurysm without rupture ([place])   5. Hyperlipidemia, unspecified hyperlipidemia type      PLAN:  In order of problems listed above: Severe MR on recent echo 04/21/2021 LVEF 60 to 65%, grade 1 DD.  Dr. Johney Frame recommends TEE to further evaluate.  I discussed the procedure, risks and benefits in detail with patient and she prefers to discuss with Dr. Orvan Seen at her appointment with him next week before agreeing to proceed.  PAF CHA2DS2-VASc equals 6 on flecainide and Eliquis  Chronic diastolic CHF now on Lasix no edema.  Follow-up labs checked by PCP and were stable.  Thoracic aortic aneurysm-50 mm on most recent echo to be followed by Dr. Orvan Seen  Hyperlipidemia LDL 114 08/2020 Lavacol managed by PCP   Shared Decision Making/Informed Consent   Shared Decision Making/Informed Consent The risks [esophageal damage, perforation (1:10,000 risk), bleeding, pharyngeal hematoma as well as other potential complications associated with conscious sedation including aspiration, arrhythmia, respiratory failure and death], benefits (treatment guidance and diagnostic support) and alternatives of a transesophageal  echocardiogram were discussed in detail with Ms. Dunsmore and she is willing to proceed.     Medication Adjustments/Labs and Tests Ordered: Current medicines are reviewed at length with the patient today.  Concerns regarding medicines are outlined above.  Medication changes, Labs and Tests ordered today are listed in the Patient Instructions below. Patient Instructions  Medication Instructions:  Your physician recommends that you continue on your current medications as directed. Please refer to the Current Medication list given to you today.  *If you need a refill on your cardiac medications before your next appointment, please call your pharmacy*   Lab Work: None If you have labs (blood work) drawn today and your tests are completely normal, you will receive your results only by: [place] (if you have MyChart) OR A paper copy in the mail If you have any lab test that is abnormal or we need to change your treatment, we will call you to review the results.   Follow-Up: At [facility], you and your health  needs are our priority.  As part of our continuing mission to provide you with exceptional heart care, we have created designated Provider Care Teams.  These Care Teams include your primary Cardiologist (physician) and Advanced Practice Providers (APPs -  Physician Assistants and Nurse Practitioners) who all work together to provide you with the care you need, when you need it.  We recommend signing up for the patient portal called "MyChart".  Sign up information is provided on this After Visit Summary.  MyChart is used to connect with patients for Virtual Visits (Telemedicine).  Patients are able to view lab/test results, encounter notes, upcoming appointments, etc.  Non-urgent messages can be sent to your provider as well.   To learn more about what you can do with MyChart, go to [email or web address].    Your next appointment:   6 month(s)  The format for your next  appointment:   In Person  Provider:   You may see Freada Bergeron, MD or one of the following Advanced Practice Providers on your designated Care Team:   Richardson Dopp, PA-C Robbie Lis, PA-C   Other Instructions    Signed, Ermalinda Barrios, Hershal Coria  04/26/2021 11:50 AM    [address], [address], [place]  [address] Phone: [phone]; Fax: [phone]

## 2021-04-26 ENCOUNTER — Encounter (INDEPENDENT_AMBULATORY_CARE_PROVIDER_SITE_OTHER): Payer: Self-pay

## 2021-04-26 ENCOUNTER — Encounter: Payer: Self-pay | Admitting: Physician Assistant

## 2021-04-26 ENCOUNTER — Other Ambulatory Visit: Payer: Self-pay

## 2021-04-26 ENCOUNTER — Ambulatory Visit (INDEPENDENT_AMBULATORY_CARE_PROVIDER_SITE_OTHER): Payer: Medicare Other | Admitting: Physician Assistant

## 2021-04-26 VITALS — BP 126/70 | HR 100 | Ht 59.0 in | Wt 105.8 lb

## 2021-04-26 DIAGNOSIS — I340 Nonrheumatic mitral (valve) insufficiency: Secondary | ICD-10-CM

## 2021-04-26 DIAGNOSIS — E785 Hyperlipidemia, unspecified: Secondary | ICD-10-CM | POA: Diagnosis not present

## 2021-04-26 DIAGNOSIS — I712 Thoracic aortic aneurysm, without rupture, unspecified: Secondary | ICD-10-CM

## 2021-04-26 DIAGNOSIS — I5032 Chronic diastolic (congestive) heart failure: Secondary | ICD-10-CM | POA: Diagnosis not present

## 2021-04-26 DIAGNOSIS — I480 Paroxysmal atrial fibrillation: Secondary | ICD-10-CM | POA: Diagnosis not present

## 2021-04-26 NOTE — Patient Instructions (Signed)
Medication Instructions:  Your physician recommends that you continue on your current medications as directed. Please refer to the Current Medication list given to you today.  *If you need a refill on your cardiac medications before your next appointment, please call your pharmacy*   Lab Work: None If you have labs (blood work) drawn today and your tests are completely normal, you will receive your results only by: [place] (if you have MyChart) OR A paper copy in the mail If you have any lab test that is abnormal or we need to change your treatment, we will call you to review the results.   Follow-Up: At [facility], you and your health needs are our priority.  As part of our continuing mission to provide you with exceptional heart care, we have created designated Provider Care Teams.  These Care Teams include your primary Cardiologist (physician) and Advanced Practice Providers (APPs -  Physician Assistants and Nurse Practitioners) who all work together to provide you with the care you need, when you need it.  We recommend signing up for the patient portal called "MyChart".  Sign up information is provided on this After Visit Summary.  MyChart is used to connect with patients for Virtual Visits (Telemedicine).  Patients are able to view lab/test results, encounter notes, upcoming appointments, etc.  Non-urgent messages can be sent to your provider as well.   To learn more about what you can do with MyChart, go to [email or web address].    Your next appointment:   6 month(s)  The format for your next appointment:   In Person  Provider:   You may see Freada Bergeron, MD or one of the following Advanced Practice Providers on your designated Care Team:   Richardson Dopp, PA-C Robbie Lis, [place]   Other Instructions

## 2021-04-27 ENCOUNTER — Telehealth: Payer: Self-pay | Admitting: Physician Assistant

## 2021-04-27 NOTE — Telephone Encounter (Signed)
    Pt is calling, she said she looked on her AVS and she saw she was billed for EKG and she did not get an EKG, she said Sharyn Lull did mention she needs EKG but already had it last month so [place] canceled it. She said she already spoke with billing and was advised to call the nurse to correct it

## 2021-04-27 NOTE — Telephone Encounter (Signed)
Patient was returning call. Says that she really needs to speak with either Anderson Malta or April. Please call back

## 2021-04-28 ENCOUNTER — Other Ambulatory Visit: Payer: Self-pay | Admitting: Neurosurgery

## 2021-04-28 DIAGNOSIS — M4120 Other idiopathic scoliosis, site unspecified: Secondary | ICD-10-CM

## 2021-04-28 DIAGNOSIS — M5412 Radiculopathy, cervical region: Secondary | ICD-10-CM

## 2021-04-28 NOTE — Telephone Encounter (Signed)
Spoke with pt and she states she is absolutely sure she did not get an EKG at her appt.  States CMA mentioned doing one but she told her that she had just had one recently, so they did not proceed with doing one at this appt.  Advised I have deleted this order.  Pt appreciative for call.

## 2021-04-28 NOTE — Telephone Encounter (Signed)
    Pt is calling back to f/u, she requested if someone can her callback today

## 2021-04-28 NOTE — Addendum Note (Signed)
Addended by: Loren Racer on: 04/28/2021 11:51 AM   Modules accepted: Orders

## 2021-05-01 ENCOUNTER — Ambulatory Visit: Payer: Medicare Other | Admitting: Cardiothoracic Surgery

## 2021-05-02 ENCOUNTER — Other Ambulatory Visit: Payer: Self-pay | Admitting: Cardiothoracic Surgery

## 2021-05-02 DIAGNOSIS — I7121 Aneurysm of the ascending aorta, without rupture: Secondary | ICD-10-CM

## 2021-05-02 DIAGNOSIS — I712 Thoracic aortic aneurysm, without rupture: Secondary | ICD-10-CM

## 2021-05-04 ENCOUNTER — Ambulatory Visit
Admission: RE | Admit: 2021-05-04 | Discharge: 2021-05-04 | Disposition: A | Discharge status: Home / Self Care | Payer: Medicare Other | Source: Ambulatory Visit | Attending: Cardiothoracic Surgery | Admitting: Cardiothoracic Surgery

## 2021-05-04 ENCOUNTER — Other Ambulatory Visit: Payer: Self-pay

## 2021-05-04 ENCOUNTER — Encounter: Payer: Self-pay | Admitting: Cardiothoracic Surgery

## 2021-05-04 ENCOUNTER — Ambulatory Visit (INDEPENDENT_AMBULATORY_CARE_PROVIDER_SITE_OTHER): Payer: Medicare Other | Admitting: Cardiothoracic Surgery

## 2021-05-04 VITALS — BP 133/76 | HR 72 | Resp 20 | Ht <= 58 in | Wt 109.0 lb

## 2021-05-04 DIAGNOSIS — I712 Thoracic aortic aneurysm, without rupture: Secondary | ICD-10-CM

## 2021-05-04 DIAGNOSIS — M40204 Unspecified kyphosis, thoracic region: Secondary | ICD-10-CM | POA: Diagnosis not present

## 2021-05-04 DIAGNOSIS — I7121 Aneurysm of the ascending aorta, without rupture: Secondary | ICD-10-CM

## 2021-05-04 DIAGNOSIS — K449 Diaphragmatic hernia without obstruction or gangrene: Secondary | ICD-10-CM | POA: Diagnosis not present

## 2021-05-04 DIAGNOSIS — I517 Cardiomegaly: Secondary | ICD-10-CM | POA: Diagnosis not present

## 2021-05-04 NOTE — Progress Notes (Signed)
Chief complaint: Aneurysm surveillance  History of present illness: 85 year old lady with significant underlying medical disorders including frailty presents for ongoing evaluation of ascending aortic aneurysm.  She is a former patient of Dr. Lucianne Lei Trigt's.  At their last visit together, they determined that further CT scanning of the chest was not necessary since she was not a candidate for urgent or elective aortic procedures.  In the meantime, she has been evaluated for mitral valve regurgitation and shortness of breath.  She is scheduled to undergo TEE in the near future to ascertain whether she is a candidate for mitral clip.   Physical exam: BP 133/76 (BP Location: Left Arm, Patient Position: Sitting, Cuff Size: Normal)   Pulse 72   Resp 20   Ht 4\' 9"  (1.448 m)   Wt 49.4 kg   SpO2 97% Comment: RA  BMI 23.59 kg/m  Frail elderly lady in no acute distress Irregularly irregular rhythm 3/6 systolic ejection murmur at the apex and radiating to the back  Imaging: Chest x-ray from today demonstrates kyphosis and clear lung fields  Impression/plan: She has no further need for thoracic surgical surveillance of her aneurysm given her desire to avoid surgery under any conditions.  Follow-up only as needed Recommend referral to Dr. Sherren Mocha for consideration of mitral clip therapy.  Keldan Eplin Z. Orvan Seen, [place]

## 2021-05-22 ENCOUNTER — Ambulatory Visit
Admission: RE | Admit: 2021-05-22 | Discharge: 2021-05-22 | Disposition: A | Discharge status: Home / Self Care | Payer: Medicare Other | Source: Ambulatory Visit | Attending: Neurosurgery | Admitting: Neurosurgery

## 2021-05-22 DIAGNOSIS — S22000A Wedge compression fracture of unspecified thoracic vertebra, initial encounter for closed fracture: Secondary | ICD-10-CM | POA: Diagnosis not present

## 2021-05-22 DIAGNOSIS — S22089A Unspecified fracture of T11-T12 vertebra, initial encounter for closed fracture: Secondary | ICD-10-CM | POA: Diagnosis not present

## 2021-05-22 DIAGNOSIS — M4120 Other idiopathic scoliosis, site unspecified: Secondary | ICD-10-CM

## 2021-05-22 DIAGNOSIS — M5412 Radiculopathy, cervical region: Secondary | ICD-10-CM

## 2021-05-22 DIAGNOSIS — M4802 Spinal stenosis, cervical region: Secondary | ICD-10-CM | POA: Diagnosis not present

## 2021-05-29 DIAGNOSIS — M545 Low back pain, unspecified: Secondary | ICD-10-CM | POA: Diagnosis not present

## 2021-05-29 DIAGNOSIS — I10 Essential (primary) hypertension: Secondary | ICD-10-CM | POA: Diagnosis not present

## 2021-05-29 DIAGNOSIS — M542 Cervicalgia: Secondary | ICD-10-CM | POA: Diagnosis not present

## 2021-05-29 DIAGNOSIS — M810 Age-related osteoporosis without current pathological fracture: Secondary | ICD-10-CM | POA: Diagnosis not present

## 2021-05-29 DIAGNOSIS — M5416 Radiculopathy, lumbar region: Secondary | ICD-10-CM | POA: Diagnosis not present

## 2021-05-29 DIAGNOSIS — S22000A Wedge compression fracture of unspecified thoracic vertebra, initial encounter for closed fracture: Secondary | ICD-10-CM | POA: Diagnosis not present

## 2021-06-05 ENCOUNTER — Encounter: Payer: Self-pay | Admitting: Cardiovascular Disease

## 2021-06-05 ENCOUNTER — Other Ambulatory Visit: Payer: Self-pay

## 2021-06-05 ENCOUNTER — Ambulatory Visit (INDEPENDENT_AMBULATORY_CARE_PROVIDER_SITE_OTHER): Payer: Medicare Other | Admitting: Cardiovascular Disease

## 2021-06-05 VITALS — BP 110/80 | HR 68 | Ht 59.0 in | Wt 103.2 lb

## 2021-06-05 DIAGNOSIS — I340 Nonrheumatic mitral (valve) insufficiency: Secondary | ICD-10-CM

## 2021-06-05 NOTE — Patient Instructions (Signed)
Medication Instructions:  Your physician recommends that you continue on your current medications as directed. Please refer to the Current Medication list given to you today.  *If you need a refill on your cardiac medications before your next appointment, please call your pharmacy*   Lab Work: None If you have labs (blood work) drawn today and your tests are completely normal, you will receive your results only by: [place] (if you have MyChart) OR A paper copy in the mail If you have any lab test that is abnormal or we need to change your treatment, we will call you to review the results.   Testing/Procedures: Your physician has requested that you have an echocardiogram prior to seeing Dr. Johney Frame. Echocardiography is a painless test that uses sound waves to create images of your heart. It provides your doctor with information about the size and shape of your heart and how well your heart's chambers and valves are working. This procedure takes approximately one hour. There are no restrictions for this procedure.    Follow-Up: At [facility], you and your health needs are our priority.  As part of our continuing mission to provide you with exceptional heart care, we have created designated Provider Care Teams.  These Care Teams include your primary Cardiologist (physician) and Advanced Practice Providers (APPs -  Physician Assistants and Nurse Practitioners) who all work together to provide you with the care you need, when you need it.  We recommend signing up for the patient portal called "MyChart".  Sign up information is provided on this After Visit Summary.  MyChart is used to connect with patients for Virtual Visits (Telemedicine).  Patients are able to view lab/test results, encounter notes, upcoming appointments, etc.  Non-urgent messages can be sent to your provider as well.   To learn more about what you can do with MyChart, go to [email or web address].    Your next  appointment:   6 month(s)  The format for your next appointment:   In Person  Provider:   You may see Freada Bergeron, MD or one of the following Advanced Practice Providers on your designated Care Team:   Richardson Dopp, PA-C Robbie Lis, [place]   Other Instructions

## 2021-06-05 NOTE — Progress Notes (Signed)
[facility]   MULTIDISCIPLINARY HEART VALVE TEAM  Date:  06/05/2021   ID:  [name], DOB 10-01-1936, MRN [id number]  PCP:  [name], MD   No chief complaint on file.    HISTORY OF PRESENT ILLNESS: [name] is a 85 y.o. female who presents for evaluation of mitral regurgitation, referred by Dr Johney Frame.  The patient is here alone today.  She is widowed.  She has had mitral valve prolapse for several years, and was demonstrated to have a partial P2 flail on a transesophageal echocardiogram from 2018.  However, recent surface echocardiogram suggested possible worsening of her mitral regurgitation with continued demonstration of the P2 flail, and effective regurgitant orifice of 0.3 cm, and a regurgitant volume of 56 mL.  The patient's LV function is preserved.  From a symptomatic perspective, the patient reports no significant change.  She has also had a thoracic aneurysm that is followed conservatively because she is not felt to be a candidate for repair even if her aneurysm were to grow.  The patient has a history of paroxysmal atrial fibrillation.  She has mild shortness of breath but is not limited by her breathing with normal activities.  She has significant functional limitation from severe kyphoscoliosis.  She denies recent heart palpitations, chest pain, lightheadedness, or syncope.  With recent findings from her echocardiogram.  She presents today for MitraClip discussion before wanting to proceed with transesophageal echo or any further studies.  Past Medical History:  Diagnosis Date   Anxiety    Aortic regurgitation    Breast cancer ([place]) 08/20/2011   R breast DCIS, ER/PR +   Cataract 3 and 10/92   bilateral   Chronic diastolic CHF (congestive heart failure) (HCC)    Compression fracture of fourth lumbar vertebra (H[facility])    DVT (deep venous thrombosis) ([place]) 08/2011   LL extremity    Fibromyalgia    Fracture lumbar vertebra-closed (HCC)     Fracture of thoracic vertebra, closed (HCC)    GERD (gastroesophageal reflux disease)    H/O hiatal hernia    History of blood clots    History of radiation therapy 01/2012   R breast   Hypercholesteremia    Hypertension    DR Orinda Kenner   Hypothyroidism    Mitral regurgitation    Mitral valve prolapse    Osteoporosis    PAF (paroxysmal atrial fibrillation) ([place])    a. dx 11/2016.   Pulmonary hypertension ([place])    Rib fractures    Thoracic ascending aortic aneurysm ([facility])    a. followed by Dr. Prescott Gum.   Tricuspid regurgitation     Current Outpatient Medications  Medication Sig Dispense Refill   apixaban (ELIQUIS) 2.5 MG TABS tablet Take 1 tablet (2.5 mg total) by mouth 2 (two) times daily. 60 tablet 5   BONIVA 150 MG tablet Take 150 mg by mouth every 30 (thirty) days.      calcium citrate-vitamin D (CITRACAL+D) 315-200 MG-UNIT per tablet Take 1 tablet by mouth 2 (two) times daily. 60 tablet 11   Camphor-Eucalyptus-Menthol (VICKS VAPORUB EX) Apply 1 application topically daily as needed (toenail fungus).      cholecalciferol (VITAMIN D) 1000 UNITS tablet Take 1,000 Units by mouth daily with lunch. Vitamin D3     clotrimazole-betamethasone (LOTRISONE) cream Apply topically 3 (three) times daily as needed.     conjugated estrogens (PREMARIN) vaginal cream Place 1 Applicatorful vaginally 2 (two) times a week.  diltiazem (CARDIZEM CD) 180 MG 24 hr capsule Take 1 capsule daily 90 capsule 1   flecainide (TAMBOCOR) 50 MG tablet Take 1 tablet (50 mg total) by mouth 2 (two) times daily. 180 tablet 1   furosemide (LASIX) 20 MG tablet Take 20 mg by mouth daily.     HYDROcodone-acetaminophen (NORCO/VICODIN) 5-325 MG tablet Take 1-2 tablets by mouth every 4 (four) hours as needed (mild pain). 30 tablet 0   iron polysaccharides (NIFEREX) 150 MG capsule Take 150 mg by mouth 3 (three) times a week.      levothyroxine (SYNTHROID, LEVOTHROID) 125 MCG tablet Take 125 mcg by mouth daily before  breakfast.     losartan (COZAAR) 25 MG tablet Take 1 tablet (25 mg total) by mouth daily. Please keep upcoming appt in April 2022 with Dr. Meda Coffee before anymore refills. Thank  You 90 tablet 1   Multiple Vitamin (MULTIVITAMIN) tablet Take 1 tablet by mouth daily with breakfast.     omeprazole (PRILOSEC OTC) 20 MG tablet Take 20 mg by mouth daily before breakfast.      potassium chloride (KLOR-CON) 10 MEQ tablet Take 10 mEq by mouth daily.     potassium citrate (UROCIT-K) 10 MEQ (1080 MG) SR tablet Take 10 mEq by mouth daily as needed.     pravastatin (PRAVACHOL) 20 MG tablet Take 20 mg by mouth daily with supper.      solifenacin (VESICARE) 5 MG tablet Take 5 mg by mouth daily with breakfast.      No current facility-administered medications for this visit.    ALLERGIES:   Contrast media [iodinated diagnostic agents], Iohexol, Mucinex [guaifenesin er], Zanaflex [tizanidine hcl], Penicillins, and Robaxin [methocarbamol]   SOCIAL HISTORY:  The patient  reports that she has never smoked. She has never used smokeless tobacco. She reports that she does not drink alcohol and does not use drugs.   FAMILY HISTORY:  The patient's family history includes Heart disease in her maternal grandfather, maternal uncle, and mother.   REVIEW OF SYSTEMS:  Positive for back pain, neck pain, generalized fatigue.   All other systems are reviewed and negative.   PHYSICAL EXAM: VS:  BP 110/80   Pulse 68   Ht '4\' 11"'$  (1.499 m)   Wt 103 lb 3.2 oz (46.8 kg)   SpO2 95%   BMI 20.84 kg/m  , BMI Body mass index is 20.84 kg/m. GEN: Pleasant elderly woman, in no acute distress HEENT: normal Neck: No JVD. carotids 2+ without bruits or masses Cardiac: The heart is RRR with 2/6 holosystolic murmur at the apex no edema. Pedal pulses 2+ = bilaterally  Respiratory:  clear to auscultation bilaterally GI: soft, nontender, nondistended, + BS MS: Severe kyphoscoliosis Skin: warm and dry, no rash Neuro:  Strength and  sensation are intact Psych: euthymic mood, full affect  EKG:  EKG from 03/22/2021 reviewed and demonstrates normal sinus rhythm 65 bpm, left anterior fascicular block.  RECENT LABS: No results found for requested labs within last 8760 hours.  No results found for requested labs within last 8760 hours.   CrCl cannot be calculated (Patient's most recent lab result is older than the maximum 21 days allowed.).   Wt Readings from Last 3 Encounters:  06/05/21 103 lb 3.2 oz (46.8 kg)  05/04/21 109 lb (49.4 kg)  04/26/21 105 lb 12.8 oz (48 kg)     CARDIAC STUDIES: Echo 04/21/2021: 1. Left ventricular ejection fraction, by estimation, is 60 to 65%. The  left ventricle has normal  function. The left ventricle has no regional  wall motion abnormalities. There is moderate asymmetric hypertrophy of the  basal septum. The rest of the LV  segments demonstrate mild left ventricular hypertrophy. Left ventricular  diastolic parameters are consistent with Grade I diastolic dysfunction  (impaired relaxation).   2. Right ventricular systolic function is normal. The right ventricular  size is normal. There is mildly elevated pulmonary artery systolic  pressure. The estimated right ventricular systolic pressure is 0000000 mmHg.   3. Left atrial size was severely dilated.   4. The mitral valve is myxomatous. There is moderate holosystolic  prolapse of of the mitral valve most notably of the posterior mitral valve  leaflet with possible P2 flail. Using a PISA radius of 0.9, there is at  least moderate-to-severe mitral  regurgitation with EROA 0.3cm2, RVol 70m. Recommend TEE for further  evaluation.   5. Tricuspid valve regurgitation is moderate.   6. The aortic valve is tricuspid. There is mild calcification of the  aortic valve. There is mild thickening of the aortic valve. Aortic valve  regurgitation is mild to moderate. Mild to moderate aortic valve  sclerosis/calcification is present, without  any  evidence of aortic stenosis.   7. Aortic dilatation noted. There is mild dilatation of the aortic root,  measuring 39 mm. There is moderate-to-severe dilatation of the ascending  aorta, measuring 50 mm.   8. The inferior vena cava is normal in size with greater than 50%  respiratory variability, suggesting right atrial pressure of 3 mmHg.   9. Right atrial size was mildly dilated.   Comparison(s): Compared to prior echo, the MR now appears severe. The  ascending aorta now measures 569m(previously 4155m   Transesophageal echo 12/04/2016: - Left ventricle: There was mild concentric hypertrophy. Systolic    function was normal. The estimated ejection fraction was in the    range of 60% to 65%. Wall motion was normal; there were no    regional wall motion abnormalities.  - Aortic valve: Sclerosis with mild AI.  - Aorta: Dilated. Ascending aortic diameter: 45 mm (S).  - Mitral valve: Partially flail P2 segment with moderate,    posteriorly directed regurgitation. Effective regurgitant orifice    (PISA): 0.33 cm^2. Regurgitant volume (PISA): 40 ml.  - Left atrium: Moderately dilated. No evidence of thrombus in the    atrial cavity or appendage.  - Right atrium: No evidence of thrombus in the atrial cavity or    appendage.  - Atrial septum: No defect or patent foramen ovale was identified.  - Pulmonic valve: No evidence of vegetation.   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    LVEF 60-65%, moderate LAE, no LAA thrombus.   ASSESSMENT AND PLAN: 1. [age]ar old woman with moderately severe mitral regurgitation with partially flail P2 segment.  Review of her echocardiogram demonstrates an ERO of 0.3, redundancy of the mitral valve leaflets with a P2 flail, preserved LV systolic function with LVEF 60 to 65%, and an estimated RV systolic pressure of 45 mmHg.  I have reviewed the  natural history of mitral regurgitation with the patient today. We have discussed the limitations of medical therapy and the poor prognosis associated with symptomatic mitral regurgitation. We have also reviewed potential treatment options, including palliative medical therapy, conventional surgical mitral valve repair or replacement, and percutaneous mitral valve therapies such as edge-to-edge mitral  valve approximation with MitraClip. We discussed treatment options in the context of this patient's specific comorbid medical conditions.   The patient is not inclined to pursue any further evaluation or treatment of her mitral regurgitation.  She might be open to the idea if her cardiac symptoms progress over time.  She has very advanced kyphoscoliosis and I have concerns about how she would do with TEE, cardiac catheterization, and general anesthesia for MitraClip.  I think a conservative approach to her care is probably appropriate.  She has significant functional limitation from her advanced arthritic changes.  I have recommended ongoing surveillance and would like her to return in about 6 months with an echocardiogram and follow-up with Dr. Johney Frame.  I would be happy to see her in the future if her mitral regurgitation worsens or if she develops complicating features of pulmonary hypertension, congestive heart failure, or fails medical therapy.  Deatra James 06/05/2021 11:49 AM     [facility] [address] [place] [address]  [phone] (office) [phone] (fax)

## 2021-07-05 DIAGNOSIS — M810 Age-related osteoporosis without current pathological fracture: Secondary | ICD-10-CM | POA: Diagnosis not present

## 2021-07-05 DIAGNOSIS — I872 Venous insufficiency (chronic) (peripheral): Secondary | ICD-10-CM | POA: Diagnosis not present

## 2021-07-05 DIAGNOSIS — R42 Dizziness and giddiness: Secondary | ICD-10-CM | POA: Diagnosis not present

## 2021-08-22 DIAGNOSIS — I340 Nonrheumatic mitral (valve) insufficiency: Secondary | ICD-10-CM | POA: Diagnosis not present

## 2021-08-22 DIAGNOSIS — G8929 Other chronic pain: Secondary | ICD-10-CM | POA: Diagnosis not present

## 2021-08-22 DIAGNOSIS — I480 Paroxysmal atrial fibrillation: Secondary | ICD-10-CM | POA: Diagnosis not present

## 2021-08-22 DIAGNOSIS — I509 Heart failure, unspecified: Secondary | ICD-10-CM | POA: Diagnosis not present

## 2021-08-22 DIAGNOSIS — M810 Age-related osteoporosis without current pathological fracture: Secondary | ICD-10-CM | POA: Diagnosis not present

## 2021-08-22 DIAGNOSIS — Z86718 Personal history of other venous thrombosis and embolism: Secondary | ICD-10-CM | POA: Diagnosis not present

## 2021-08-22 DIAGNOSIS — K219 Gastro-esophageal reflux disease without esophagitis: Secondary | ICD-10-CM | POA: Diagnosis not present

## 2021-08-22 DIAGNOSIS — Z0000 Encounter for general adult medical examination without abnormal findings: Secondary | ICD-10-CM | POA: Diagnosis not present

## 2021-08-22 DIAGNOSIS — I872 Venous insufficiency (chronic) (peripheral): Secondary | ICD-10-CM | POA: Diagnosis not present

## 2021-08-22 DIAGNOSIS — E782 Mixed hyperlipidemia: Secondary | ICD-10-CM | POA: Diagnosis not present

## 2021-08-22 DIAGNOSIS — Z1389 Encounter for screening for other disorder: Secondary | ICD-10-CM | POA: Diagnosis not present

## 2021-08-22 DIAGNOSIS — I712 Thoracic aortic aneurysm, without rupture, unspecified: Secondary | ICD-10-CM | POA: Diagnosis not present

## 2021-08-22 DIAGNOSIS — I10 Essential (primary) hypertension: Secondary | ICD-10-CM | POA: Diagnosis not present

## 2021-08-22 DIAGNOSIS — Z23 Encounter for immunization: Secondary | ICD-10-CM | POA: Diagnosis not present

## 2021-08-29 DIAGNOSIS — N8111 Cystocele, midline: Secondary | ICD-10-CM | POA: Diagnosis not present

## 2021-08-29 DIAGNOSIS — Z4689 Encounter for fitting and adjustment of other specified devices: Secondary | ICD-10-CM | POA: Diagnosis not present

## 2021-09-25 ENCOUNTER — Other Ambulatory Visit: Payer: Self-pay | Admitting: *Deleted

## 2021-09-25 MED ORDER — DILTIAZEM HCL ER COATED BEADS 180 MG PO CP24: ORAL_CAPSULE | ORAL | 1 refills | Status: DC

## 2021-10-09 ENCOUNTER — Other Ambulatory Visit: Payer: Self-pay | Admitting: *Deleted

## 2021-10-09 MED ORDER — LOSARTAN POTASSIUM 25 MG PO TABS: 25.0000 mg | ORAL_TABLET | Freq: Every day | ORAL | 0 refills | Status: DC

## 2021-10-10 ENCOUNTER — Other Ambulatory Visit: Payer: Self-pay

## 2021-10-10 DIAGNOSIS — I340 Nonrheumatic mitral (valve) insufficiency: Secondary | ICD-10-CM

## 2021-10-10 DIAGNOSIS — R42 Dizziness and giddiness: Secondary | ICD-10-CM | POA: Diagnosis not present

## 2021-10-10 MED ORDER — FLECAINIDE ACETATE 50 MG PO TABS: 50.0000 mg | ORAL_TABLET | Freq: Two times a day (BID) | ORAL | 2 refills | Status: DC

## 2021-10-16 ENCOUNTER — Other Ambulatory Visit: Payer: Self-pay

## 2021-10-16 MED ORDER — APIXABAN 2.5 MG PO TABS: 2.5000 mg | ORAL_TABLET | Freq: Two times a day (BID) | ORAL | 5 refills | Status: DC

## 2021-10-16 NOTE — Telephone Encounter (Signed)
Pt last saw Dr Burt Knack 06/05/21, last labs 07/05/21 Creat 0.74 at [facility] per [facility], age 85, weight 46.8kg, based on specified criteria pt is on appropriate dosage of Eliquis 2.5mg  BID for afib.  Will refill rx.

## 2021-10-20 DIAGNOSIS — Z961 Presence of intraocular lens: Secondary | ICD-10-CM | POA: Diagnosis not present

## 2021-10-20 DIAGNOSIS — H26493 Other secondary cataract, bilateral: Secondary | ICD-10-CM | POA: Diagnosis not present

## 2021-10-20 DIAGNOSIS — H52203 Unspecified astigmatism, bilateral: Secondary | ICD-10-CM | POA: Diagnosis not present

## 2021-10-20 DIAGNOSIS — H31003 Unspecified chorioretinal scars, bilateral: Secondary | ICD-10-CM | POA: Diagnosis not present

## 2021-10-30 DIAGNOSIS — L438 Other lichen planus: Secondary | ICD-10-CM | POA: Diagnosis not present

## 2021-10-30 DIAGNOSIS — L821 Other seborrheic keratosis: Secondary | ICD-10-CM | POA: Diagnosis not present

## 2021-10-30 DIAGNOSIS — L853 Xerosis cutis: Secondary | ICD-10-CM | POA: Diagnosis not present

## 2021-10-30 DIAGNOSIS — Z85828 Personal history of other malignant neoplasm of skin: Secondary | ICD-10-CM | POA: Diagnosis not present

## 2021-11-17 ENCOUNTER — Other Ambulatory Visit: Payer: Self-pay

## 2021-11-17 ENCOUNTER — Ambulatory Visit (HOSPITAL_COMMUNITY): Payer: Medicare Other | Attending: Cardiology

## 2021-11-17 ENCOUNTER — Other Ambulatory Visit (HOSPITAL_COMMUNITY): Payer: Medicare Other

## 2021-11-17 DIAGNOSIS — I340 Nonrheumatic mitral (valve) insufficiency: Secondary | ICD-10-CM | POA: Insufficient documentation

## 2021-11-17 LAB — ECHOCARDIOGRAM COMPLETE
Area-P 1/2: 3.27 cm2
Calc EF: 58.6 %
MV M vel: 5.44 m/s
MV Peak grad: 118.2 mmHg
P 1/2 time: 613 msec
Radius: 0.75 cm
S' Lateral: 2.4 cm
Single Plane A2C EF: 55.4 %
Single Plane A4C EF: 56.5 %

## 2021-11-21 NOTE — Progress Notes (Deleted)
Cardiology Office Note:    Date:  11/21/2021   ID:  [name], DOB 1936/09/24, MRN [id number]  PCP:  Annette Orn, MD   [facility] Providers Cardiologist:  Annette Bergeron, MD {  Referring MD: Annette Orn, MD    History of Present Illness:    [name] is a 86 y.o. female with a hx of pAfib, chronic diastolic CHF, hypertension, HLD, thoracic aneurysm previously followed by Dr. Lucianne Lei [name]-not felt to be a candidate for repair given severe kyphosis who was previously followed by Dr. Meda [name] who now returns to clinic for follow-up.  Per review of the record, TTE 11/28/2016 with LVEF 55 to 60%, mild to moderate AI, moderate MR, severe atrial enlargement, severe TR.  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 Also with paroxysmal Afib s/p spontaneously conversion to normal sinus rhythm on diltiazem and flecainide.  Myoview 01/2017 poor quality study but low risk EF 72%.  Echo 07/2018 EF 55 to 60% with grade 1 DD mildly dilated ascending aorta with moderate MVP with MR and moderate to severe dilated LA moderate TR moderate pulmonary hypertension.  Last seen in clinic on 03/22/21 where she was feeling okay with mild dyspnea on exertion. TTE at that time showed moderate holosystolic prolapse with moderate-to-severe MR with possible P2 flail. Saw Dr. Burt [name] on 06/05/21 where she was deemed best for medical management given relative lack of symptoms and comorbidities. Repeat TTE showed stable moderate vs moderate-to-severe MR.   Today, ***    Past Medical History:  Diagnosis Date   Anxiety    Aortic regurgitation    Breast cancer ([place]) 08/20/2011   R breast DCIS, ER/PR +   Cataract 3 and 10/92   bilateral   Chronic diastolic CHF (congestive heart failure) (HCC)    Compression fracture of fourth lumbar vertebra (HCC)    DVT (deep venous thrombosis) ([place]) 08/2011   LL extremity    Fibromyalgia     Fracture lumbar vertebra-closed ([place])    Fracture of thoracic vertebra, closed ([place])    GERD (gastroesophageal reflux disease)    H/O hiatal hernia    History of blood clots    History of radiation therapy 01/2012   R breast   Hypercholesteremia    Hypertension    DR [name]   Hypothyroidism    Mitral regurgitation    Mitral valve prolapse    Osteoporosis    PAF (paroxysmal atrial fibrillation) ([place])    a. dx 11/2016.   Pulmonary hypertension ([place])    Rib fractures    Thoracic ascending aortic aneurysm ([place])    a. followed by Dr. Prescott [name].   Tricuspid regurgitation     Past Surgical History:  Procedure Laterality Date   ANTERIOR CERVICAL DECOMP/DISCECTOMY FUSION N/A 09/09/2015   Procedure: Anterior Cervical Decompression and Fusion Cervical seven-Thorasic one ;  Surgeon: Annette Levine, MD;  Location: [facility];  Service: Neurosurgery;  Laterality: N/A;   [place]  10/16/2006   RIH - Dr Hassell Done   KYPHOPLASTY N/A 01/26/2013   Procedure: KYPHOPLASTY;  Surgeon: Annette Miss, MD;  Location: [place] [facility];  Service: Neurosurgery;  Laterality: N/A;  T11 and L1   KYPHOPLASTY N/A 06/21/2017   Procedure: Lumbar four Kyphoplasty;  Surgeon: [name],  Annette John, MD;  Location: [place];  Service: Neurosurgery;  Laterality: N/A;   MASTECTOMY, PARTIAL  10/17/2011   Procedure: MASTECTOMY PARTIAL;  Surgeon: Haywood Lasso, MD;  Location: [place];  Service: General;  Laterality: Right;  needle guided   TEE WITHOUT CARDIOVERSION N/A 12/04/2016   Procedure: TRANSESOPHAGEAL ECHOCARDIOGRAM (TEE);  Surgeon: Pixie Casino, MD;  Location: [facility];  Service: Cardiovascular;  Laterality: N/A;   TONSILLECTOMY  1944    Current Medications: No outpatient medications have been marked as taking for the 11/23/21 encounter (Appointment) with Annette Bergeron, MD.     Allergies:   Contrast media [iodinated  contrast media], Iohexol, Mucinex [guaifenesin er], Zanaflex [tizanidine hcl], Penicillins, and Robaxin [methocarbamol]   Social History   Socioeconomic History   Marital status: Married    Spouse name: Not on file   Number of children: Not on file   Years of education: Not on file   Highest education level: Not on file  Occupational History   Not on file  Tobacco Use   Smoking status: Never   Smokeless tobacco: Never  Vaping Use   Vaping Use: Never used  Substance and Sexual Activity   Alcohol use: No   Drug use: No   Sexual activity: Never  Other Topics Concern   Not on file  Social History Narrative   Not on file   Social Determinants of Health   Financial Resource Strain: Not on file  Food Insecurity: Not on file  Transportation Needs: Not on file  Physical Activity: Not on file  Stress: Not on file  Social Connections: Not on file     Family History: The patient's family history includes Heart disease in her maternal grandfather, maternal uncle, and mother.  ROS:   Please see the history of present illness.    Review of Systems  Constitutional:  Negative for chills and fever.  HENT:  Negative for sore throat.   Eyes:  Negative for blurred vision.  Respiratory:  Negative for shortness of breath.   Cardiovascular:  Positive for leg swelling. Negative for chest pain, palpitations, orthopnea, claudication and PND.  Gastrointestinal:  Negative for nausea and vomiting.  Genitourinary:  Negative for hematuria.  Musculoskeletal:  Positive for back pain, joint pain and myalgias.  Neurological:  Negative for dizziness and loss of consciousness.  Endo/Heme/Allergies:  Negative for polydipsia.  Psychiatric/Behavioral:  Negative for substance abuse.    EKGs/Labs/Other Studies Reviewed:    The following studies were reviewed today: TTE 11-18-2021: IMPRESSIONS     1. Left ventricular ejection fraction, by estimation, is 60 to 65%. The  left ventricle has normal  function. The left ventricle has no regional  wall motion abnormalities. There is moderate hypertrophy of the basal  septal segment. The rest of the LV  segments demonstrate mild asymmetric left ventricular hypertrophy of the  basal-septal segment. Left ventricular diastolic parameters are consistent  with Grade I diastolic dysfunction (impaired relaxation).   2. Right ventricular systolic function is normal. The right ventricular  size is mildly enlarged. There is severely elevated pulmonary artery  systolic pressure. The estimated right ventricular systolic pressure is  37.6 mmHg.   3. Left atrial size was severely dilated.   4. Right atrial size was mildly dilated.   5. The mitral valve is myxomatous. There is bileaflet prolapse that is  more notable in the posterior mitral valve leaflet. There is moderate  mitral regurgitation EROA 0.23 with Rvol 69mL.   6. Tricuspid valve regurgitation  is moderate to severe.   7. The aortic valve is tricuspid. There is mild calcification of the  aortic valve. There is mild thickening of the aortic valve. Aortic valve  regurgitation is mild to moderate.   8. Aortic dilatation noted. There is moderate-to-severe dilatation of the  ascending aorta, measuring 49 mm. There is mild dilatation of the aortic  root, measuring 40 mm.   Comparison(s): Compared to prior TTE in 04/2021, the mitral regurgitation  appears moderate on current study which is slightly less than prior in the  setting of a lower systolic blood pressure. The ascending aortic size  remains stably dilated at 32mm  (previously 31mm).   TTE 04/2021: IMPRESSIONS     1. Left ventricular ejection fraction, by estimation, is 60 to 65%. The  left ventricle has normal function. The left ventricle has no regional  wall motion abnormalities. There is moderate asymmetric hypertrophy of the  basal septum. The rest of the LV  segments demonstrate mild left ventricular hypertrophy. Left  ventricular  diastolic parameters are consistent with Grade I diastolic dysfunction  (impaired relaxation).   2. Right ventricular systolic function is normal. The right ventricular  size is normal. There is mildly elevated pulmonary artery systolic  pressure. The estimated right ventricular systolic pressure is 68.1 mmHg.   3. Left atrial size was severely dilated.   4. The mitral valve is myxomatous. There is moderate holosystolic  prolapse of of the mitral valve most notably of the posterior mitral valve  leaflet with possible P2 flail. Using a PISA radius of 0.9, there is at  least moderate-to-severe mitral  regurgitation with EROA 0.3cm2, RVol 40mL. Recommend TEE for further  evaluation.   5. Tricuspid valve regurgitation is moderate.   6. The aortic valve is tricuspid. There is mild calcification of the  aortic valve. There is mild thickening of the aortic valve. Aortic valve  regurgitation is mild to moderate. Mild to moderate aortic valve  sclerosis/calcification is present, without  any evidence of aortic stenosis.   7. Aortic dilatation noted. There is mild dilatation of the aortic root,  measuring 39 mm. There is moderate-to-severe dilatation of the ascending  aorta, measuring 50 mm.   8. The inferior vena cava is normal in size with greater than 50%  respiratory variability, suggesting right atrial pressure of 3 mmHg.   9. Right atrial size was mildly dilated.   Comparison(s): Compared to prior echo, the MR now appears severe. The  ascending aorta now measures 66mm (previously 43mm).  2D echo 9/25/2019Study Conclusions   - Left ventricle: The cavity size was normal. There was severe    focal basal hypertrophy. Systolic function was normal. The    estimated ejection fraction was in the range of 55% to 60%. Wall    motion was normal; there were no regional wall motion    abnormalities. There was an increased relative contribution of    atrial contraction to ventricular  filling. Doppler parameters are    consistent with abnormal left ventricular relaxation (grade 1    diastolic dysfunction). Doppler parameters are consistent with    high ventricular filling pressure.  - Aortic valve: Trileaflet; mildly thickened, mildly calcified    leaflets. There was mild regurgitation.  - Aorta: Ascending aorta diameter: 41 mm (ED).  - Ascending aorta: The ascending aorta was mildly dilated.  - Mitral valve: Mild diffuse thickening, consistent with myxomatous    proliferation. Moderate, holosystolic prolapse, involving the    posterior > anterior  leaflet. There was moderate regurgitation.    Effective regurgitant orifice (PISA): 0.15 cm^2. Regurgitant    volume (PISA): 32 ml.  - Left atrium: The atrium was moderately to severely dilated.  - Tricuspid valve: There was moderate regurgitation.  - Pulmonary arteries: PA peak pressure: 45 mm Hg (S).   Impressions:   - The right ventricular systolic pressure was increased consistent    with moderate pulmonary hypertension.  Myoview 01/2017: The nuclear study is very poor quality due to body habitus Defect 1: There is a defect present in the basal inferolateral and mid inferolateral location that is most likely artifact due to uptake in structures below the diaphragm. This is a low risk study. Nuclear stress EF: 72%. The left ventricular ejection fraction is hyperdynamic (>65%).     EKG:  EKG is  ordered today.  The ekg ordered today demonstrates NSR, LAFB, anterior infarct (unchanged), first degree AVB, HR 65  Recent Labs: No results found for requested labs within last 8760 hours.  Recent Lipid Panel    Component Value Date/Time   CHOL  12/02/2010 0400    149        ATP III CLASSIFICATION:  <200     mg/dL   Desirable  200-239  mg/dL   Borderline High  >=240    mg/dL   High          TRIG 54 12/02/2010 0400   HDL 58 12/02/2010 0400   CHOLHDL 2.6 12/02/2010 0400   VLDL 11 12/02/2010 0400   LDLCALC   12/02/2010 0400    80        Total Cholesterol/HDL:CHD Risk Coronary Heart Disease Risk Table                     Men   Women  1/2 Average Risk   3.4   3.3  Average Risk       5.0   4.4  2 X Average Risk   9.6   7.1  3 X Average Risk  23.4   11.0        Use the calculated Patient Ratio above and the CHD Risk Table to determine the patient's CHD Risk.        ATP III CLASSIFICATION (LDL):  <100     mg/dL   Optimal  100-129  mg/dL   Near or Above                    Optimal  130-159  mg/dL   Borderline  160-189  mg/dL   High  >190     mg/dL   Very High     Risk Assessment/Calculations:    CHA2DS2-VASc Score = 6  This indicates a 9.7% annual risk of stroke. The patient's score is based upon: CHF History: 1 HTN History: 1 Diabetes History: 0 Stroke History: 0 Vascular Disease History: 1 Age Score: 2 Gender Score: 1    Physical Exam:    VS:  There were no vitals taken for this visit.    Wt Readings from Last 3 Encounters:  06/05/21 103 lb 3.2 oz (46.8 kg)  05/04/21 109 lb (49.4 kg)  04/26/21 105 lb 12.8 oz (48 kg)     GEN: Elderly female, comfortable HEENT: Left eye strabismus NECK: No JVD; No carotid bruits CARDIAC: RRR, 2/6 systolic murmur. No rubs, gallops RESPIRATORY:  Clear to auscultation without rales, wheezing or rhonchi  ABDOMEN: Soft, non-tender, non-distended MUSCULOSKELETAL:  Trace pedal edema; warm.  Has scoliosis SKIN: Warm and dry NEUROLOGIC:  Alert and oriented x 3 PSYCHIATRIC:  Normal affect   ASSESSMENT:    No diagnosis found.  PLAN:    In order of problems listed above:  #Paroxysmal Afib: CHADs-vasc 6. On flecainide and Eliquis. No bleeding issues. -Continue flecainide and apixaban for [facility] -Continue diltiazem 180mg  daily   #Chronic diastolic CHF: TTE 28/63/81 with LVEF 60-65%, moderate vs moderate to severe MR due to bileaflet prolapse. Currently euvolemic on exam. -Continue lasix 20mg  daily -Continue losartan 25mg  daily -Repeat  BMET at PCP office in next week  #Thoracic aortic aneurysm: Previously followed by Dr. Prescott [name] now transitioned to Dr. Orvan Seen. Managed conservatively as deemed to be not a surgical candidate -Continue losartan 25mg  daily -Continue pravsatatin 20mg    #Valvular heart disease with moderate MVP and moderate-to-severe MR, moderate TR mild AI and moderate pulmonary hypertension:  TTE 11/17/21 with LVEF 60-65%, moderate vs moderate to severe MR due to bileaflet prolapse. Given relative lack of symptoms and medical comorbidities, managing medically at this time.  -Medical management   #Hyperlipidemia: -Continue pravasatin 20mg  daily       Medication Adjustments/Labs and Tests Ordered: Current medicines are reviewed at length with the patient today.  Concerns regarding medicines are outlined above.  No orders of the defined types were placed in this encounter.  No orders of the defined types were placed in this encounter.   There are no Patient Instructions on file for this visit.    Signed, Annette Bergeron, MD  11/21/2021 12:07 PM    [place]

## 2021-11-23 ENCOUNTER — Other Ambulatory Visit: Payer: Self-pay

## 2021-11-23 ENCOUNTER — Ambulatory Visit (INDEPENDENT_AMBULATORY_CARE_PROVIDER_SITE_OTHER): Payer: Medicare Other | Admitting: Cardiology

## 2021-11-23 ENCOUNTER — Encounter: Payer: Self-pay | Admitting: Cardiology

## 2021-11-23 VITALS — BP 128/80 | HR 69 | Ht 59.0 in | Wt 106.6 lb

## 2021-11-23 DIAGNOSIS — I480 Paroxysmal atrial fibrillation: Secondary | ICD-10-CM

## 2021-11-23 DIAGNOSIS — I7121 Aneurysm of the ascending aorta, without rupture: Secondary | ICD-10-CM

## 2021-11-23 DIAGNOSIS — I340 Nonrheumatic mitral (valve) insufficiency: Secondary | ICD-10-CM

## 2021-11-23 DIAGNOSIS — Z79899 Other long term (current) drug therapy: Secondary | ICD-10-CM

## 2021-11-23 DIAGNOSIS — R202 Paresthesia of skin: Secondary | ICD-10-CM | POA: Diagnosis not present

## 2021-11-23 DIAGNOSIS — I5032 Chronic diastolic (congestive) heart failure: Secondary | ICD-10-CM

## 2021-11-23 DIAGNOSIS — R2681 Unsteadiness on feet: Secondary | ICD-10-CM | POA: Diagnosis not present

## 2021-11-23 DIAGNOSIS — I10 Essential (primary) hypertension: Secondary | ICD-10-CM

## 2021-11-23 DIAGNOSIS — I38 Endocarditis, valve unspecified: Secondary | ICD-10-CM | POA: Diagnosis not present

## 2021-11-23 LAB — BASIC METABOLIC PANEL
BUN/Creatinine Ratio: 43 — ABNORMAL HIGH (ref 12–28)
BUN: 32 mg/dL — ABNORMAL HIGH (ref 8–27)
CO2: 26 mmol/L (ref 20–29)
Calcium: 9.9 mg/dL (ref 8.7–10.3)
Chloride: 98 mmol/L (ref 96–106)
Creatinine, Ser: 0.74 mg/dL (ref 0.57–1.00)
Glucose: 98 mg/dL (ref 70–99)
Potassium: 4.3 mmol/L (ref 3.5–5.2)
Sodium: 137 mmol/L (ref 134–144)
eGFR: 80 mL/min/{1.73_m2} (ref >59)

## 2021-11-23 LAB — MAGNESIUM: Magnesium: 2.3 mg/dL (ref 1.6–2.3)

## 2021-11-23 MED ORDER — FUROSEMIDE 20 MG PO TABS: 20.0000 mg | ORAL_TABLET | ORAL | 2 refills | Status: DC

## 2021-11-23 MED ORDER — MAGNESIUM 200 MG PO TABS: 200.0000 mg | ORAL_TABLET | Freq: Every day | ORAL | 4 refills | Status: DC

## 2021-11-23 MED ORDER — POTASSIUM CHLORIDE CRYS ER 10 MEQ PO TBCR: 10.0000 meq | EXTENDED_RELEASE_TABLET | ORAL | 1 refills | Status: DC

## 2021-11-23 NOTE — Progress Notes (Signed)
Cardiology Office Note:    Date:  11/23/2021   ID:  [name], DOB 01-06-1936, MRN [id number]  PCP:  [name], MD   [facility] Providers Cardiologist:  Freada Bergeron, MD {  Referring MD: [name], MD    History of Present Illness:    [name] is a 86 y.o. female with a hx of pAfib, chronic diastolic CHF, hypertension, HLD, thoracic aneurysm previously followed by Dr. Lucianne Lei Trigt-not felt to be a candidate for repair given severe kyphosis who was previously followed by Dr. Meda Coffee who now returns to clinic for follow-up.  Per review of the record, TTE 11/28/2016 with LVEF 55 to 60%, mild to moderate AI, moderate MR, severe atrial enlargement, severe TR.  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 Also with paroxysmal Afib s/p spontaneously conversion to normal sinus rhythm on diltiazem and flecainide.  Myoview 01/2017 poor quality study but low risk EF 72%.  Echo 07/2018 EF 55 to 60% with grade 1 DD mildly dilated ascending aorta with moderate MVP with MR and moderate to severe dilated LA moderate TR moderate pulmonary hypertension.  Last seen in clinic on 03/22/21 where she was feeling okay with mild dyspnea on exertion. TTE at that time showed moderate holosystolic prolapse with moderate-to-severe MR with possible P2 flail. Saw Dr. Burt Knack on 06/05/21 where she was deemed best for medical management given relative lack of symptoms and comorbidities. Repeat TTE showed stable moderate vs moderate-to-severe MR.   Today, she states she has been more "lightheaded" and unsteady. She feels lightheaded everyday that worsens when she walks. She uses her walker to help her balance. Specifically, she does not feel like she will lose consciousness but does feel like she may fall due to being off balance. She denies lightheadedness at rest or with positional changes. She denies periods of low blood pressure or  heart rate. She cannot tell if her heart is out of rhythm.  She endorses full-body paresthesia with fatigue but feels the tingling primarily in her bilateral hands and legs. She had surgery in her neck but it was determined this sensation was not caused but her neck.   She endorses LE edema but cannot tell if she has trace edema. She takes the Lasix once MWF which causes her to urinate frequently.   The patient denies chest pain, chest pressure, dyspnea at rest or with exertion, palpitations, PND,or  orthopnea. Denies cough, fever, chills. Denies nausea, vomiting. Denies syncope or presyncope. Denies snoring.  Past Medical History:  Diagnosis Date   Anxiety    Aortic regurgitation    Breast cancer ([place]) 08/20/2011   R breast DCIS, ER/PR +   Cataract 3 and 10/92   bilateral   Chronic diastolic CHF (congestive heart failure) (HCC)    Compression fracture of fourth lumbar vertebra (HCC)    DVT (deep venous thrombosis) ([place]) 08/2011   LL extremity    Fibromyalgia    Fracture lumbar vertebra-closed (HCC)    Fracture of thoracic vertebra, closed (HCC)    GERD (gastroesophageal reflux disease)    H/O hiatal hernia    History of blood clots    History of radiation therapy 01/2012   R breast   Hypercholesteremia    Hypertension    DR Orinda Kenner   Hypothyroidism    Mitral regurgitation    Mitral valve prolapse    Osteoporosis  PAF (paroxysmal atrial fibrillation) ([place])    a. dx 11/2016.   Pulmonary hypertension (HCC)    Rib fractures    Thoracic ascending aortic aneurysm    a. followed by Dr. Prescott Gum.   Tricuspid regurgitation     Past Surgical History:  Procedure Laterality Date   ANTERIOR CERVICAL DECOMP/DISCECTOMY FUSION N/A 09/09/2015   Procedure: Anterior Cervical Decompression and Fusion Cervical seven-Thorasic one ;  Surgeon: Erline Levine, MD;  Location: [place] [facility]UR[facility]S;  Service: Neurosurgery;  Laterality: N/A;   [place]  10/16/2006   RIH - Dr Hassell Done   KYPHOPLASTY N/A 01/26/2013   Procedure: KYPHOPLASTY;  Surgeon: Kristeen Miss, MD;  Location: [place] [facility];  Service: Neurosurgery;  Laterality: N/A;  T11 and L1   KYPHOPLASTY N/A 06/21/2017   Procedure: Lumbar four Kyphoplasty;  Surgeon: Erline Levine, MD;  Location: [place];  Service: Neurosurgery;  Laterality: N/A;   MASTECTOMY, PARTIAL  10/17/2011   Procedure: MASTECTOMY PARTIAL;  Surgeon: Haywood Lasso, MD;  Location: [place];  Service: General;  Laterality: Right;  needle guided   TEE WITHOUT CARDIOVERSION N/A 12/04/2016   Procedure: TRANSESOPHAGEAL ECHOCARDIOGRAM (TEE);  Surgeon: Pixie Casino, MD;  Location: [facility] ENDOSCOPY;  Service: Cardiovascular;  Laterality: N/A;   TONSILLECTOMY  1944    Current Medications: Current Meds  Medication Sig   apixaban (ELIQUIS) 2.5 MG TABS tablet Take 1 tablet (2.5 mg total) by mouth 2 (two) times daily.   calcium citrate-vitamin D (CITRACAL+D) 315-200 MG-UNIT per tablet Take 1 tablet by mouth 2 (two) times daily.   Camphor-Eucalyptus-Menthol (VICKS VAPORUB EX) Apply 1 application topically daily as needed (toenail fungus).    cholecalciferol (VITAMIN D) 1000 UNITS tablet Take 1,000 Units by mouth daily with lunch. Vitamin D3   clotrimazole-betamethasone (LOTRISONE) cream Apply topically 3 (three) times daily as needed.   D-Mannose 500 MG CAPS Take by mouth daily.   diltiazem (CARDIZEM CD) 180 MG 24 hr capsule Take 1 capsule daily   estradiol (ESTRACE) 0.1 MG/GM vaginal cream Place 1 Applicatorful vaginally once a week.   [START ON 11/24/2021] furosemide (LASIX) 20 MG tablet Take 1 tablet (20 mg total) by mouth 3 (three) times a week. Take on Monday, Wed, and Fridays.   HYDROcodone-acetaminophen (NORCO/VICODIN) 5-325 MG tablet Take 1-2 tablets by mouth every 4 (four) hours as needed (mild pain).   iron polysaccharides (NIFEREX) 150 MG capsule Take 150 mg by mouth 3 (three)  times a week.    levothyroxine (SYNTHROID, LEVOTHROID) 125 MCG tablet Take 125 mcg by mouth daily before breakfast.   losartan (COZAAR) 25 MG tablet Take 1 tablet (25 mg total) by mouth daily.   Magnesium 200 MG TABS Take 1 tablet (200 mg total) by mouth daily.   Meclizine HCl (BONINE PO) Take by mouth as needed.   Multiple Vitamin (MULTIVITAMIN) tablet Take 1 tablet by mouth daily with breakfast.   omeprazole (PRILOSEC OTC) 20 MG tablet Take 20 mg by mouth daily before breakfast.    [START ON 11/24/2021] potassium chloride (KLOR-CON M) 10 MEQ tablet Take 1 tablet (10 mEq total) by mouth 3 (three) times a week. Take on Mon, Wed, and Fridays with lasix.   pravastatin (PRAVACHOL) 20 MG tablet Take 20 mg by mouth daily with supper.    risedronate (ACTONEL) 150 MG tablet Take 150 mg by mouth every 30 (  thirty) days. with water on empty stomach, nothing by mouth or lie down for next 30 minutes.   solifenacin (VESICARE) 5 MG tablet Take 5 mg by mouth daily with breakfast.    [DISCONTINUED] flecainide (TAMBOCOR) 50 MG tablet Take 1 tablet (50 mg total) by mouth 2 (two) times daily.   [DISCONTINUED] furosemide (LASIX) 20 MG tablet Take 20 mg by mouth daily.   [DISCONTINUED] potassium chloride (KLOR-CON) 10 MEQ tablet Take 10 mEq by mouth daily.     Allergies:   Contrast media [iodinated contrast media], Iohexol, Mucinex [guaifenesin er], Zanaflex [tizanidine hcl], Penicillins, and Robaxin [methocarbamol]   Social History   Socioeconomic History   Marital status: Married    Spouse name: Not on file   Number of children: Not on file   Years of education: Not on file   Highest education level: Not on file  Occupational History   Not on file  Tobacco Use   Smoking status: Never   Smokeless tobacco: Never  Vaping Use   Vaping Use: Never used  Substance and Sexual Activity   Alcohol use: No   Drug use: No   Sexual activity: Never  Other Topics Concern   Not on file  Social History Narrative    Not on file   Social Determinants of Health   Financial Resource Strain: Not on file  Food Insecurity: Not on file  Transportation Needs: Not on file  Physical Activity: Not on file  Stress: Not on file  Social Connections: Not on file     Family History: The patient's family history includes Heart disease in her maternal grandfather, maternal uncle, and mother.  ROS:   Please see the history of present illness.    Review of Systems  Constitutional:  Positive for malaise/fatigue. Negative for chills and fever.  HENT:  Negative for sore throat.   Eyes:  Negative for blurred vision.  Respiratory:  Negative for shortness of breath.   Cardiovascular:  Positive for leg swelling. Negative for chest pain, palpitations, orthopnea, claudication and PND.  Gastrointestinal:  Negative for nausea and vomiting.  Genitourinary:  Negative for hematuria.  Musculoskeletal:  Negative for back pain, joint pain and myalgias.  Skin:  Negative for itching and rash.  Neurological:  Positive for tingling and weakness (Unsteady). Negative for dizziness and loss of consciousness.  Endo/Heme/Allergies:  Negative for polydipsia.  Psychiatric/Behavioral:  Negative for substance abuse.    EKGs/Labs/Other Studies Reviewed:    The following studies were reviewed today: TTE 12-02-2021: IMPRESSIONS   1. Left ventricular ejection fraction, by estimation, is 60 to 65%. The left ventricle has normal function. The left ventricle has no regional wall motion abnormalities. There is moderate hypertrophy of the basal septal segment. The rest of the LV  segments demonstrate mild asymmetric left ventricular hypertrophy of the basal-septal segment. Left ventricular diastolic parameters are consistent with Grade I diastolic dysfunction (impaired relaxation).   2. Right ventricular systolic function is normal. The right ventricular size is mildly enlarged. There is severely elevated pulmonary artery systolic pressure. The  estimated right ventricular systolic pressure is  16.1 mmHg.   3. Left atrial size was severely dilated.   4. Right atrial size was mildly dilated.   5. The mitral valve is myxomatous. There is bileaflet prolapse that is  more notable in the posterior mitral valve leaflet. There is moderate  mitral regurgitation EROA 0.23 with Rvol 82mL.   6. Tricuspid valve regurgitation is moderate to severe.   7. The aortic  valve is tricuspid. There is mild calcification of the  aortic valve. There is mild thickening of the aortic valve. Aortic valve  regurgitation is mild to moderate.   8. Aortic dilatation noted. There is moderate-to-severe dilatation of the  ascending aorta, measuring 49 mm. There is mild dilatation of the aortic  root, measuring 40 mm.   Comparison(s): Compared to prior TTE in 04/2021, the mitral regurgitation  appears moderate on current study which is slightly less than prior in the  setting of a lower systolic blood pressure. The ascending aortic size  remains stably dilated at 55mm  (previously 39mm).   TTE 04/2021: IMPRESSIONS   1. Left ventricular ejection fraction, by estimation, is 60 to 65%. The left ventricle has normal function. The left ventricle has no regional wall motion abnormalities. There is moderate asymmetric hypertrophy of the basal septum. The rest of the LV  segments demonstrate mild left ventricular hypertrophy. Left ventricular diastolic parameters are consistent with Grade I diastolic dysfunction (impaired relaxation).   2. Right ventricular systolic function is normal. The right ventricular size is normal. There is mildly elevated pulmonary artery systolic pressure. The estimated right ventricular systolic pressure is 88.8 mmHg.   3. Left atrial size was severely dilated.   4. The mitral valve is myxomatous. There is moderate holosystolic prolapse of of the mitral valve most notably of the posterior mitral valve leaflet with possible P2 flail. Using a PISA  radius of 0.9, there is at least moderate-to-severe mitral  regurgitation with EROA 0.3cm2, RVol 83mL. Recommend TEE for further evaluation.   5. Tricuspid valve regurgitation is moderate.   6. The aortic valve is tricuspid. There is mild calcification of the  aortic valve. There is mild thickening of the aortic valve. Aortic valve regurgitation is mild to moderate. Mild to moderate aortic valve sclerosis/calcification is present, without any evidence of aortic stenosis.   7. Aortic dilatation noted. There is mild dilatation of the aortic root, measuring 39 mm. There is moderate-to-severe dilatation of the ascending aorta, measuring 50 mm.   8. The inferior vena cava is normal in size with greater than 50% respiratory variability, suggesting right atrial pressure of 3 mmHg.   9. Right atrial size was mildly dilated.   Comparison(s): Compared to prior echo, the MR now appears severe. The ascending aorta now measures 60mm (previously 7mm).  2D echo 08/06/2018 Study Conclusions   - Left ventricle: The cavity size was normal. There was severe    focal basal hypertrophy. Systolic function was normal. The    estimated ejection fraction was in the range of 55% to 60%.   Wall motion was normal; there were no regional wall motion    abnormalities. There was an increased relative contribution of    atrial contraction to ventricular filling. Doppler parameters are    consistent with abnormal left ventricular relaxation (grade 1    diastolic dysfunction). Doppler parameters are consistent with    high ventricular filling pressure.  - Aortic valve: Trileaflet; mildly thickened, mildly calcified    leaflets. There was mild regurgitation.  - Aorta: Ascending aorta diameter: 41 mm (ED).  - Ascending aorta: The ascending aorta was mildly dilated.  - Mitral valve: Mild diffuse thickening, consistent with myxomatous    proliferation. Moderate, holosystolic prolapse, involving the    posterior > anterior  leaflet. There was moderate regurgitation.    Effective regurgitant orifice (PISA): 0.15 cm^2. Regurgitant    volume (PISA): 32 ml.  - Left atrium: The atrium was  moderately to severely dilated.  - Tricuspid valve: There was moderate regurgitation.  - Pulmonary arteries: PA peak pressure: 45 mm Hg (S).   Impressions:   - The right ventricular systolic pressure was increased consistent    with moderate pulmonary hypertension.  Myoview 01/2017: The nuclear study is very poor quality due to body habitus Defect 1: There is a defect present in the basal inferolateral and mid inferolateral location that is most likely artifact due to uptake in structures below the diaphragm. This is a low risk study. Nuclear stress EF: 72%. The left ventricular ejection fraction is hyperdynamic (>65%).     EKG:  EKG was not ordered today 03/22/21: NSR, LAFB, anterior infarct (unchanged), first degree AVB, HR 65  Recent Labs: No results found for requested labs within last 8760 hours.  Recent Lipid Panel    Component Value Date/Time   CHOL  12/02/2010 0400    149        ATP III CLASSIFICATION:  <200     mg/dL   Desirable  200-239  mg/dL   Borderline High  >=240    mg/dL   High          TRIG 54 12/02/2010 0400   HDL 58 12/02/2010 0400   CHOLHDL 2.6 12/02/2010 0400   VLDL 11 12/02/2010 0400   LDLCALC  12/02/2010 0400    80        Total Cholesterol/HDL:CHD Risk Coronary Heart Disease Risk Table                     Men   Women  1/2 Average Risk   3.4   3.3  Average Risk       5.0   4.4  2 X Average Risk   9.6   7.1  3 X Average Risk  23.4   11.0        Use the calculated Patient Ratio above and the CHD Risk Table to determine the patient's CHD Risk.        ATP III CLASSIFICATION (LDL):  <100     mg/dL   Optimal  100-129  mg/dL   Near or Above                    Optimal  130-159  mg/dL   Borderline  160-189  mg/dL   High  >190     mg/dL   Very High     Risk Assessment/Calculations:     CHA2DS2-VASc Score = 6  This indicates a 9.7% annual risk of stroke. The patient's score is based upon: CHF History: 1 HTN History: 1 Diabetes History: 0 Stroke History: 0 Vascular Disease History: 1 Age Score: 2 Gender Score: 1    Physical Exam:    VS:  BP 128/80    Pulse 69    Ht 4\' 11"  (1.499 m)    Wt 106 lb 9.6 oz (48.4 kg)    SpO2 98%    BMI 21.53 kg/m     Wt Readings from Last 3 Encounters:  11/23/21 106 lb 9.6 oz (48.4 kg)  06/05/21 103 lb 3.2 oz (46.8 kg)  05/04/21 109 lb (49.4 kg)     GEN: Elderly female, comfortable, Kyphotic. HEENT: Left eye strabismus NECK: No JVD; No carotid bruits CARDIAC: RRR, 3/6 systolic murmur heard throughout the precordium. No rubs, gallops RESPIRATORY:  Clear to auscultation without rales, wheezing or rhonchi  ABDOMEN: Soft, non-tender, non-distended MUSCULOSKELETAL:  no edema; warm. Has  scoliosis SKIN: Warm and dry NEUROLOGIC:  Alert and oriented x 3 PSYCHIATRIC:  Normal affect   ASSESSMENT:    1. Unsteady gait   2. Tingling sensation   3. Medication management   4. Valvular heart disease   5. Essential hypertension   6. Severe mitral regurgitation   7. PAF (paroxysmal atrial fibrillation) ([place])   8. Chronic diastolic CHF (congestive heart failure) ([place])   9. Aneurysm of ascending aorta without rupture     PLAN:    In order of problems listed above:  #Unsteady Gait: Patient reports lightheadedness, however, on further questioning it seems she feels off balance as she does not feel faint but like she could fall due to balance issues. No episodes of hypotension and patient not orthostatic. She has regular meals and drinks water. Asked her to take her blood pressure as able when she has the episodes but has already kept a thorough log for PCP and reportedly no issues. May need PT due to imbalance/unsteady gait. -No episodes of hypotension -Appears euvolemic  #Paresthesias: Patient has tingling in hands and feet. Notably  has significant scoliosis and therefore neuropathy is a concern. Will check BMET and Mg to ensure not related to electrolyte abnormalities.  -Check BMET -Check Mg -Start PO Mag supplemenation -Change K citrate to chloride as this may be causing symptoms as well  #Paroxysmal Afib: CHADs-vasc 6. On apixaban and flecainide (has been on long term). No palpitations -Stop flecainide given valve disease and advanced age -Continue apixaban 5mg  BID -Continue diltiazem 180mg  daily -Will repeat ECG at next visit; if recurrent Afib or difficult to control rates, can start amiodarone   #Chronic diastolic CHF: TTE 16/10/96 with LVEF 60-65%, moderate vs moderate to severe MR due to bileaflet prolapse. Currently euvolemic on exam. -Continue lasix 20mg  M, W, F -Change to potassium chloride 10mg Eq M, W, F -Continue losartan 25mg  daily -Repeat BMET and Mg today given tingling  #Thoracic aortic aneurysm: Previously followed by Dr. Prescott Gum now transitioned to Dr. Orvan Seen. Managed conservatively as deemed to be not a surgical candidate -Continue losartan 25mg  daily -Continue pravsatatin 20mg    #Valvular heart disease with moderate MVP and moderate-to-severe MR, moderate TR mild AI and moderate pulmonary hypertension:  TTE 11/17/21 with LVEF 60-65%, moderate vs moderate to severe MR due to bileaflet prolapse. Given relative lack of symptoms and medical comorbidities, managing medically at this time.  -Medical management -Continue lasix 20mg  M, W, F with K 47mEq on those days -Continue losartan and dilt as above; BP well controlled   #Hyperlipidemia: -Continue pravasatin 20mg  daily    Medication Adjustments/Labs and Tests Ordered: Current medicines are reviewed at length with the patient today.  Concerns regarding medicines are outlined above.  Orders Placed This Encounter  Procedures   Basic metabolic panel   Magnesium   Meds ordered this encounter  Medications   Magnesium 200 MG TABS    Sig:  Take 1 tablet (200 mg total) by mouth daily.    Dispense:  30 tablet    Refill:  4   furosemide (LASIX) 20 MG tablet    Sig: Take 1 tablet (20 mg total) by mouth 3 (three) times a week. Take on Monday, Wed, and Fridays.    Dispense:  45 tablet    Refill:  2    Dose decrease   potassium chloride (KLOR-CON M) 10 MEQ tablet    Sig: Take 1 tablet (10 mEq total) by mouth 3 (three) times a week. Take on Mon,  Wed, and Fridays with lasix.    Dispense:  45 tablet    Refill:  1    Dose change-decreased    Patient Instructions  Medication Instructions:   STOP TAKING FLECAINIDE NOW  START TAKING LASIX 20 MG BY MOUTH THREE TIMES WEEKLY--TAKE ON MONDAYS, WEDNESDAYS, AND FRIDAYS  START TAKING POTASSIUM CHLORIDE 10 mEq BY MOUTH THREE TIMES WEEKLY-TAKE ON MONDAYS, WEDNESDAYS, AND FRIDAYS WITH YOUR LASIX  START TAKING MAGNESIUM 200 MG BY MOUTH DAILY    *If you need a refill on your cardiac medications before your next appointment, please call your pharmacy*   Lab Work:  TODAY--BMET AND MAGNESIUM LEVEL  If you have labs (blood work) drawn today and your tests are completely normal, you will receive your results only by: [place] (if you have MyChart) OR A paper copy in the mail If you have any lab test that is abnormal or we need to change your treatment, we will call you to review the results.   Follow-Up:  2-3 WEEKS WITH DR. Sherril Croon NEED AN EKG DONE AT THIS VISIT--OKAY TO ADD TO DR. Zariah Jost'S SCHEDULE ON 12/14/21 AT 10 AM SLOT    I,Mykaella Javier,acting as a scribe for Freada Bergeron, MD.,have documented all relevant documentation on the behalf of Freada Bergeron, MD,as directed by  Freada Bergeron, MD while in the presence of Freada Bergeron, MD.  I, Freada Bergeron, MD, have reviewed all documentation for this visit. The documentation on 11/23/21 for the exam, diagnosis, procedures, and orders are all accurate and complete.   Signed, Freada Bergeron, MD  11/23/2021 12:26 PM    [place]

## 2021-11-23 NOTE — Patient Instructions (Addendum)
Medication Instructions:   STOP TAKING FLECAINIDE NOW  START TAKING LASIX 20 MG BY MOUTH THREE TIMES WEEKLY--TAKE ON MONDAYS, WEDNESDAYS, AND FRIDAYS  START TAKING POTASSIUM CHLORIDE 10 mEq BY MOUTH THREE TIMES WEEKLY-TAKE ON MONDAYS, WEDNESDAYS, AND FRIDAYS WITH YOUR LASIX  START TAKING MAGNESIUM 200 MG BY MOUTH DAILY    *If you need a refill on your cardiac medications before your next appointment, please call your pharmacy*   Lab Work:  TODAY--BMET AND MAGNESIUM LEVEL  If you have labs (blood work) drawn today and your tests are completely normal, you will receive your results only by: [place] (if you have MyChart) OR A paper copy in the mail If you have any lab test that is abnormal or we need to change your treatment, we will call you to review the results.   Follow-Up:  2-3 WEEKS WITH DR. Sherril Croon NEED AN EKG DONE AT THIS VISIT--OKAY TO ADD TO DR. PEMBERTON'S SCHEDULE ON 12/14/21 AT 10 AM SLOT

## 2021-11-29 DIAGNOSIS — N814 Uterovaginal prolapse, unspecified: Secondary | ICD-10-CM | POA: Diagnosis not present

## 2021-11-29 DIAGNOSIS — N8111 Cystocele, midline: Secondary | ICD-10-CM | POA: Diagnosis not present

## 2021-11-29 DIAGNOSIS — Z4689 Encounter for fitting and adjustment of other specified devices: Secondary | ICD-10-CM | POA: Diagnosis not present

## 2021-12-12 DIAGNOSIS — I10 Essential (primary) hypertension: Secondary | ICD-10-CM | POA: Diagnosis not present

## 2021-12-12 DIAGNOSIS — R269 Unspecified abnormalities of gait and mobility: Secondary | ICD-10-CM | POA: Diagnosis not present

## 2021-12-12 DIAGNOSIS — I480 Paroxysmal atrial fibrillation: Secondary | ICD-10-CM | POA: Diagnosis not present

## 2021-12-12 DIAGNOSIS — Z9989 Dependence on other enabling machines and devices: Secondary | ICD-10-CM | POA: Diagnosis not present

## 2021-12-12 DIAGNOSIS — I509 Heart failure, unspecified: Secondary | ICD-10-CM | POA: Diagnosis not present

## 2021-12-14 ENCOUNTER — Ambulatory Visit: Payer: Medicare Other | Admitting: Cardiology

## 2021-12-22 DIAGNOSIS — Z853 Personal history of malignant neoplasm of breast: Secondary | ICD-10-CM | POA: Diagnosis not present

## 2021-12-22 DIAGNOSIS — M353 Polymyalgia rheumatica: Secondary | ICD-10-CM | POA: Diagnosis not present

## 2021-12-22 DIAGNOSIS — Z9181 History of falling: Secondary | ICD-10-CM | POA: Diagnosis not present

## 2021-12-22 DIAGNOSIS — K219 Gastro-esophageal reflux disease without esophagitis: Secondary | ICD-10-CM | POA: Diagnosis not present

## 2021-12-22 DIAGNOSIS — Z79891 Long term (current) use of opiate analgesic: Secondary | ICD-10-CM | POA: Diagnosis not present

## 2021-12-22 DIAGNOSIS — I509 Heart failure, unspecified: Secondary | ICD-10-CM | POA: Diagnosis not present

## 2021-12-22 DIAGNOSIS — Z86718 Personal history of other venous thrombosis and embolism: Secondary | ICD-10-CM | POA: Diagnosis not present

## 2021-12-22 DIAGNOSIS — N3281 Overactive bladder: Secondary | ICD-10-CM | POA: Diagnosis not present

## 2021-12-22 DIAGNOSIS — R350 Frequency of micturition: Secondary | ICD-10-CM | POA: Diagnosis not present

## 2021-12-22 DIAGNOSIS — I872 Venous insufficiency (chronic) (peripheral): Secondary | ICD-10-CM | POA: Diagnosis not present

## 2021-12-22 DIAGNOSIS — M797 Fibromyalgia: Secondary | ICD-10-CM | POA: Diagnosis not present

## 2021-12-22 DIAGNOSIS — M7072 Other bursitis of hip, left hip: Secondary | ICD-10-CM | POA: Diagnosis not present

## 2021-12-22 DIAGNOSIS — M1990 Unspecified osteoarthritis, unspecified site: Secondary | ICD-10-CM | POA: Diagnosis not present

## 2021-12-22 DIAGNOSIS — I080 Rheumatic disorders of both mitral and aortic valves: Secondary | ICD-10-CM | POA: Diagnosis not present

## 2021-12-22 DIAGNOSIS — E039 Hypothyroidism, unspecified: Secondary | ICD-10-CM | POA: Diagnosis not present

## 2021-12-22 DIAGNOSIS — I480 Paroxysmal atrial fibrillation: Secondary | ICD-10-CM | POA: Diagnosis not present

## 2021-12-22 DIAGNOSIS — Z7901 Long term (current) use of anticoagulants: Secondary | ICD-10-CM | POA: Diagnosis not present

## 2021-12-22 DIAGNOSIS — I110 Hypertensive heart disease with heart failure: Secondary | ICD-10-CM | POA: Diagnosis not present

## 2021-12-22 DIAGNOSIS — Z8572 Personal history of non-Hodgkin lymphomas: Secondary | ICD-10-CM | POA: Diagnosis not present

## 2021-12-22 DIAGNOSIS — M810 Age-related osteoporosis without current pathological fracture: Secondary | ICD-10-CM | POA: Diagnosis not present

## 2021-12-22 DIAGNOSIS — E7800 Pure hypercholesterolemia, unspecified: Secondary | ICD-10-CM | POA: Diagnosis not present

## 2021-12-29 DIAGNOSIS — I080 Rheumatic disorders of both mitral and aortic valves: Secondary | ICD-10-CM | POA: Diagnosis not present

## 2021-12-29 DIAGNOSIS — E7800 Pure hypercholesterolemia, unspecified: Secondary | ICD-10-CM | POA: Diagnosis not present

## 2021-12-29 DIAGNOSIS — I110 Hypertensive heart disease with heart failure: Secondary | ICD-10-CM | POA: Diagnosis not present

## 2021-12-29 DIAGNOSIS — E039 Hypothyroidism, unspecified: Secondary | ICD-10-CM | POA: Diagnosis not present

## 2021-12-29 DIAGNOSIS — I509 Heart failure, unspecified: Secondary | ICD-10-CM | POA: Diagnosis not present

## 2021-12-29 DIAGNOSIS — I480 Paroxysmal atrial fibrillation: Secondary | ICD-10-CM | POA: Diagnosis not present

## 2022-01-02 ENCOUNTER — Other Ambulatory Visit: Payer: Self-pay | Admitting: Cardiovascular Disease

## 2022-01-05 DIAGNOSIS — I080 Rheumatic disorders of both mitral and aortic valves: Secondary | ICD-10-CM | POA: Diagnosis not present

## 2022-01-05 DIAGNOSIS — E7800 Pure hypercholesterolemia, unspecified: Secondary | ICD-10-CM | POA: Diagnosis not present

## 2022-01-05 DIAGNOSIS — I110 Hypertensive heart disease with heart failure: Secondary | ICD-10-CM | POA: Diagnosis not present

## 2022-01-05 DIAGNOSIS — E039 Hypothyroidism, unspecified: Secondary | ICD-10-CM | POA: Diagnosis not present

## 2022-01-05 DIAGNOSIS — I480 Paroxysmal atrial fibrillation: Secondary | ICD-10-CM | POA: Diagnosis not present

## 2022-01-05 DIAGNOSIS — I509 Heart failure, unspecified: Secondary | ICD-10-CM | POA: Diagnosis not present

## 2022-01-12 DIAGNOSIS — I480 Paroxysmal atrial fibrillation: Secondary | ICD-10-CM | POA: Diagnosis not present

## 2022-01-12 DIAGNOSIS — E039 Hypothyroidism, unspecified: Secondary | ICD-10-CM | POA: Diagnosis not present

## 2022-01-12 DIAGNOSIS — I509 Heart failure, unspecified: Secondary | ICD-10-CM | POA: Diagnosis not present

## 2022-01-12 DIAGNOSIS — I110 Hypertensive heart disease with heart failure: Secondary | ICD-10-CM | POA: Diagnosis not present

## 2022-01-12 DIAGNOSIS — E7800 Pure hypercholesterolemia, unspecified: Secondary | ICD-10-CM | POA: Diagnosis not present

## 2022-01-12 DIAGNOSIS — I080 Rheumatic disorders of both mitral and aortic valves: Secondary | ICD-10-CM | POA: Diagnosis not present

## 2022-01-15 DIAGNOSIS — I480 Paroxysmal atrial fibrillation: Secondary | ICD-10-CM | POA: Diagnosis not present

## 2022-01-15 DIAGNOSIS — E039 Hypothyroidism, unspecified: Secondary | ICD-10-CM | POA: Diagnosis not present

## 2022-01-15 DIAGNOSIS — I080 Rheumatic disorders of both mitral and aortic valves: Secondary | ICD-10-CM | POA: Diagnosis not present

## 2022-01-15 DIAGNOSIS — E7800 Pure hypercholesterolemia, unspecified: Secondary | ICD-10-CM | POA: Diagnosis not present

## 2022-01-15 DIAGNOSIS — I509 Heart failure, unspecified: Secondary | ICD-10-CM | POA: Diagnosis not present

## 2022-01-15 DIAGNOSIS — I110 Hypertensive heart disease with heart failure: Secondary | ICD-10-CM | POA: Diagnosis not present

## 2022-01-21 DIAGNOSIS — E7800 Pure hypercholesterolemia, unspecified: Secondary | ICD-10-CM | POA: Diagnosis not present

## 2022-01-21 DIAGNOSIS — M353 Polymyalgia rheumatica: Secondary | ICD-10-CM | POA: Diagnosis not present

## 2022-01-21 DIAGNOSIS — M1990 Unspecified osteoarthritis, unspecified site: Secondary | ICD-10-CM | POA: Diagnosis not present

## 2022-01-21 DIAGNOSIS — K219 Gastro-esophageal reflux disease without esophagitis: Secondary | ICD-10-CM | POA: Diagnosis not present

## 2022-01-21 DIAGNOSIS — Z853 Personal history of malignant neoplasm of breast: Secondary | ICD-10-CM | POA: Diagnosis not present

## 2022-01-21 DIAGNOSIS — Z7901 Long term (current) use of anticoagulants: Secondary | ICD-10-CM | POA: Diagnosis not present

## 2022-01-21 DIAGNOSIS — I509 Heart failure, unspecified: Secondary | ICD-10-CM | POA: Diagnosis not present

## 2022-01-21 DIAGNOSIS — M797 Fibromyalgia: Secondary | ICD-10-CM | POA: Diagnosis not present

## 2022-01-21 DIAGNOSIS — E039 Hypothyroidism, unspecified: Secondary | ICD-10-CM | POA: Diagnosis not present

## 2022-01-21 DIAGNOSIS — I110 Hypertensive heart disease with heart failure: Secondary | ICD-10-CM | POA: Diagnosis not present

## 2022-01-21 DIAGNOSIS — Z9181 History of falling: Secondary | ICD-10-CM | POA: Diagnosis not present

## 2022-01-21 DIAGNOSIS — Z8572 Personal history of non-Hodgkin lymphomas: Secondary | ICD-10-CM | POA: Diagnosis not present

## 2022-01-21 DIAGNOSIS — R350 Frequency of micturition: Secondary | ICD-10-CM | POA: Diagnosis not present

## 2022-01-21 DIAGNOSIS — M7072 Other bursitis of hip, left hip: Secondary | ICD-10-CM | POA: Diagnosis not present

## 2022-01-21 DIAGNOSIS — I872 Venous insufficiency (chronic) (peripheral): Secondary | ICD-10-CM | POA: Diagnosis not present

## 2022-01-21 DIAGNOSIS — N3281 Overactive bladder: Secondary | ICD-10-CM | POA: Diagnosis not present

## 2022-01-21 DIAGNOSIS — M810 Age-related osteoporosis without current pathological fracture: Secondary | ICD-10-CM | POA: Diagnosis not present

## 2022-01-21 DIAGNOSIS — I480 Paroxysmal atrial fibrillation: Secondary | ICD-10-CM | POA: Diagnosis not present

## 2022-01-21 DIAGNOSIS — Z79891 Long term (current) use of opiate analgesic: Secondary | ICD-10-CM | POA: Diagnosis not present

## 2022-01-21 DIAGNOSIS — Z86718 Personal history of other venous thrombosis and embolism: Secondary | ICD-10-CM | POA: Diagnosis not present

## 2022-01-21 DIAGNOSIS — I080 Rheumatic disorders of both mitral and aortic valves: Secondary | ICD-10-CM | POA: Diagnosis not present

## 2022-01-23 ENCOUNTER — Telehealth: Payer: Self-pay | Admitting: Cardiology

## 2022-01-23 NOTE — Telephone Encounter (Signed)
Pt is calling to ask Dr. Johney Frame if she will call her in an antibiotic to take prior to her dental work (fillings) she will be having in a week. ? ?Pt states in the past, Dr. Prescott Gum informed her if she had only cleaning done to her teeth she would not require SBE Prophylaxis, but if she had any dental work or fillings done, she would need to be prescribed SBE prophylaxis.  ? ?Pt states she will be having several fillings done and is inquiring if Dr. Johney Frame would advise on ordering an antibiotic for her to take prior to this, and if possible sending this in before this Friday, for her pharmacy delivers her meds.  Confirmed with the pt that the only allergy to antibiotics noted in her chart is Penicillins.  She confirmed this.  ? ?Informed the pt that I will route this communication to Dr. Johney Frame to further review and advise on.  Informed her I will follow-up with her accordingly thereafter.  ?Pt verbalized understanding and agrees with this plan. ?

## 2022-01-23 NOTE — Telephone Encounter (Signed)
I do not think she needs infective endocarditis prophylaxis unless she has infective endocarditis in the past or has had a valve replacement/prosthetic material placed in her heart or aorta. We no longer need it for mitral valve prolapse.  ? ?

## 2022-01-23 NOTE — Telephone Encounter (Signed)
Patient called stating she is going to be having dental work (fillings). She needs to know if she needs to be on antibiotics prior to her dental visit. If she does need to take it, she will need to have it called in.  ?

## 2022-01-23 NOTE — Telephone Encounter (Signed)
Pt aware that per Dr. Johney Frame, she will not require SBE prophylaxis for MVP.  She has had no history of bacterial endocarditis and she has not had a valve replacement.  ?Pt verbalized understanding and agrees with this plan.  Pt was more than gracious for all the assistance provided.  ?

## 2022-01-23 NOTE — Telephone Encounter (Signed)
Attempted to call the pt back x 3 and her phone was busy all times attempted.  ?

## 2022-01-24 DIAGNOSIS — I480 Paroxysmal atrial fibrillation: Secondary | ICD-10-CM | POA: Diagnosis not present

## 2022-01-24 DIAGNOSIS — E7800 Pure hypercholesterolemia, unspecified: Secondary | ICD-10-CM | POA: Diagnosis not present

## 2022-01-24 DIAGNOSIS — I509 Heart failure, unspecified: Secondary | ICD-10-CM | POA: Diagnosis not present

## 2022-01-24 DIAGNOSIS — E039 Hypothyroidism, unspecified: Secondary | ICD-10-CM | POA: Diagnosis not present

## 2022-01-24 DIAGNOSIS — I080 Rheumatic disorders of both mitral and aortic valves: Secondary | ICD-10-CM | POA: Diagnosis not present

## 2022-01-24 DIAGNOSIS — I110 Hypertensive heart disease with heart failure: Secondary | ICD-10-CM | POA: Diagnosis not present

## 2022-01-29 DIAGNOSIS — I080 Rheumatic disorders of both mitral and aortic valves: Secondary | ICD-10-CM | POA: Diagnosis not present

## 2022-01-29 DIAGNOSIS — E7800 Pure hypercholesterolemia, unspecified: Secondary | ICD-10-CM | POA: Diagnosis not present

## 2022-01-29 DIAGNOSIS — I509 Heart failure, unspecified: Secondary | ICD-10-CM | POA: Diagnosis not present

## 2022-01-29 DIAGNOSIS — I110 Hypertensive heart disease with heart failure: Secondary | ICD-10-CM | POA: Diagnosis not present

## 2022-01-29 DIAGNOSIS — I480 Paroxysmal atrial fibrillation: Secondary | ICD-10-CM | POA: Diagnosis not present

## 2022-01-29 DIAGNOSIS — E039 Hypothyroidism, unspecified: Secondary | ICD-10-CM | POA: Diagnosis not present

## 2022-01-30 DIAGNOSIS — N3000 Acute cystitis without hematuria: Secondary | ICD-10-CM | POA: Diagnosis not present

## 2022-01-30 DIAGNOSIS — R8279 Other abnormal findings on microbiological examination of urine: Secondary | ICD-10-CM | POA: Diagnosis not present

## 2022-01-30 DIAGNOSIS — N3281 Overactive bladder: Secondary | ICD-10-CM | POA: Diagnosis not present

## 2022-02-06 ENCOUNTER — Ambulatory Visit (HOSPITAL_COMMUNITY)
Admission: RE | Admit: 2022-02-06 | Discharge: 2022-02-06 | Disposition: A | Discharge status: Home / Self Care | Payer: Medicare Other | Source: Ambulatory Visit | Attending: Geriatric Medicine | Admitting: Geriatric Medicine

## 2022-02-06 ENCOUNTER — Other Ambulatory Visit: Payer: Self-pay

## 2022-02-06 ENCOUNTER — Other Ambulatory Visit (HOSPITAL_COMMUNITY): Payer: Self-pay | Admitting: Geriatric Medicine

## 2022-02-06 DIAGNOSIS — M79605 Pain in left leg: Secondary | ICD-10-CM | POA: Diagnosis not present

## 2022-02-06 DIAGNOSIS — R52 Pain, unspecified: Secondary | ICD-10-CM

## 2022-02-06 DIAGNOSIS — R202 Paresthesia of skin: Secondary | ICD-10-CM | POA: Diagnosis not present

## 2022-02-06 DIAGNOSIS — I10 Essential (primary) hypertension: Secondary | ICD-10-CM | POA: Diagnosis not present

## 2022-02-06 DIAGNOSIS — I4821 Permanent atrial fibrillation: Secondary | ICD-10-CM | POA: Diagnosis not present

## 2022-02-06 NOTE — Progress Notes (Signed)
Lower extremity venous has been completed.  ? ?Preliminary results in CV Proc.  ? ?[name] ?02/06/2022 12:56 PM    ?

## 2022-02-13 NOTE — Progress Notes (Deleted)
?Cardiology Office Note:   ? ?Date:  02/13/2022  ? ?ID:  Dmya Long, DOB 08/03/1936, MRN [id number] ? ?PCP:  [name], MD ?  ?[facility] Providers ?Cardiologist:  Freada Bergeron, MD { ? ?Referring MD: [name], MD  ? ? ?History of Present Illness:   ? ?[name] is a 86 y.o. female with a hx of pAfib, chronic diastolic CHF, hypertension, HLD, thoracic aneurysm previously followed by Dr. Lucianne Lei Trigt-not felt to be a candidate for repair given severe kyphosis who was previously followed by Dr. Meda Coffee who now returns to clinic for follow-up. ? ?Per review of the record, TTE 11/28/2016 with LVEF 55 to 60%, mild to moderate AI, moderate MR, severe atrial enlargement, severe TR.  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 Also with paroxysmal Afib s/p spontaneously conversion to normal sinus rhythm on diltiazem and flecainide.  Myoview 01/2017 poor quality study but low risk EF 72%.  Echo 07/2018 EF 55 to 60% with grade 1 DD mildly dilated ascending aorta with moderate MVP with MR and moderate to severe dilated LA moderate TR moderate pulmonary hypertension. ? ?Last seen in clinic on 03/22/21 where she was feeling okay with mild dyspnea on exertion. TTE at that time showed moderate holosystolic prolapse with moderate-to-severe MR with possible P2 flail. Saw Dr. Burt Knack on 06/05/21 where she was deemed best for medical management given relative lack of symptoms and comorbidities. Repeat TTE showed stable moderate vs moderate-to-severe MR.  ? ?Last seen in clinic on 11/2021 where she was dealing with instability with walking.  ? ?Today, ** ? ?Past Medical History:  ?Diagnosis Date  ? Anxiety   ? Aortic regurgitation   ? Breast cancer ([place]) 08/20/2011  ? R breast DCIS, ER/PR +  ? Cataract 3 and 10/92  ? bilateral  ? Chronic diastolic CHF (congestive heart failure) ([place])   ? Compression fracture of fourth lumbar vertebra (HCC)   ? DVT  (deep venous thrombosis) ([place]) 08/2011  ? LL extremity   ? Fibromyalgia   ? Fracture lumbar vertebra-closed ([place])   ? Fracture of thoracic vertebra, closed ([place])   ? GERD (gastroesophageal reflux disease)   ? H/O hiatal hernia   ? History of blood clots   ? History of radiation therapy 01/2012  ? R breast  ? Hypercholesteremia   ? Hypertension   ? DR Orinda Kenner  ? Hypothyroidism   ? Mitral regurgitation   ? Mitral valve prolapse   ? Osteoporosis   ? PAF (paroxysmal atrial fibrillation) ([place])   ? a. dx 11/2016.  ? Pulmonary hypertension ([place])   ? Rib fractures   ? Thoracic ascending aortic aneurysm   ? a. followed by Dr. Prescott Gum.  ? Tricuspid regurgitation   ? ? ?Past Surgical History:  ?Procedure Laterality Date  ? ANTERIOR CERVICAL DECOMP/DISCECTOMY FUSION N/A 09/09/2015  ? Procedure: Anterior Cervical Decompression and Fusion Cervical seven-Thorasic one ;  Surgeon: Erline Levine, MD;  Location: [place] NEURO ORS;  Service: Neurosurgery;  Laterality: N/A;  ? APPENDECTOMY  1940  ? BREAST SURGERY    ? CATARACT EXTRACTION  1992  ? [place]  ? HERNIA REPAIR  10/16/2006  ? [facility] - Dr Hassell Done  ? KYPHOPLASTY N/A 01/26/2013  ? Procedure: KYPHOPLASTY;  Surgeon: Kristeen Miss, MD;  Location: [place] NEURO ORS;  Service: Neurosurgery;  Laterality: N/A;  T11 and L1  ?  KYPHOPLASTY N/A 06/21/2017  ? Procedure: Lumbar four Kyphoplasty;  Surgeon: Erline Levine, MD;  Location: [place];  Service: Neurosurgery;  Laterality: N/A;  ? MASTECTOMY, PARTIAL  10/17/2011  ? Procedure: MASTECTOMY PARTIAL;  Surgeon: Haywood Lasso, MD;  Location: [place];  Service: General;  Laterality: Right;  needle guided  ? TEE WITHOUT CARDIOVERSION N/A 12/04/2016  ? Procedure: TRANSESOPHAGEAL ECHOCARDIOGRAM (TEE);  Surgeon: Pixie Casino, MD;  Location: [facility] END[facility]COPY;  Service: Cardiovascular;  Laterality: N/A;  ? TONSILLECTOMY  1944  ? ? ?Current Medications: ?No outpatient medications have been marked as taking for the 02/16/22 encounter (Appointment) with  Freada Bergeron, MD.  ?  ? ?Allergies:   Contrast media [iodinated contrast media], Iohexol, Mucinex [guaifenesin er], Zanaflex [tizanidine hcl], Penicillins, and Robaxin [methocarbamol]  ? ?Social History  ? ?Socioeconomic History  ? Marital status: Married  ?  Spouse name: Not on file  ? Number of children: Not on file  ? Years of education: Not on file  ? Highest education level: Not on file  ?Occupational History  ? Not on file  ?Tobacco Use  ? Smoking status: Never  ? Smokeless tobacco: Never  ?Vaping Use  ? Vaping Use: Never used  ?Substance and Sexual Activity  ? Alcohol use: No  ? Drug use: No  ? Sexual activity: Never  ?Other Topics Concern  ? Not on file  ?Social History Narrative  ? Not on file  ? ?Social Determinants of Health  ? ?Financial Resource Strain: Not on file  ?Food Insecurity: Not on file  ?Transportation Needs: Not on file  ?Physical Activity: Not on file  ?Stress: Not on file  ?Social Connections: Not on file  ?  ? ?Family History: ?The patient's family history includes Heart disease in her maternal grandfather, maternal uncle, and mother. ? ?ROS:   ?Please see the history of present illness.    ?Review of Systems  ?Constitutional:  Positive for malaise/fatigue. Negative for chills and fever.  ?HENT:  Negative for sore throat.   ?Eyes:  Negative for blurred vision.  ?Respiratory:  Negative for shortness of breath.   ?Cardiovascular:  Positive for leg swelling. Negative for chest pain, palpitations, orthopnea, claudication and PND.  ?Gastrointestinal:  Negative for nausea and vomiting.  ?Genitourinary:  Negative for hematuria.  ?Musculoskeletal:  Negative for back pain, joint pain and myalgias.  ?Skin:  Negative for itching and rash.  ?Neurological:  Positive for tingling and weakness (Unsteady). Negative for dizziness and loss of consciousness.  ?Endo/Heme/Allergies:  Negative for polydipsia.  ?Psychiatric/Behavioral:  Negative for substance abuse.   ? ?EKGs/Labs/Other Studies Reviewed:    ? ?The following studies were reviewed today: ?TTE 11/17/21: ?IMPRESSIONS  ? 1. Left ventricular ejection fraction, by estimation, is 60 to 65%. The left ventricle has normal function. The left ventricle has no regional wall motion abnormalities. There is moderate hypertrophy of the basal septal segment. The rest of the LV  ?segments demonstrate mild asymmetric left ventricular hypertrophy of the basal-septal segment. Left ventricular diastolic parameters are consistent with Grade I diastolic dysfunction (impaired relaxation).  ? 2. Right ventricular systolic function is normal. The right ventricular size is mildly enlarged. There is severely elevated pulmonary artery systolic pressure. The estimated right ventricular systolic pressure is  ?59.7 mmHg.  ? 3. Left atrial size was severely dilated.  ? 4. Right atrial size was mildly dilated.  ? 5. The mitral valve is myxomatous. There is bileaflet prolapse that is  ?more notable in the posterior  mitral valve leaflet. There is moderate  ?mitral regurgitation EROA 0.23 with Rvol 83m.  ? 6. Tricuspid valve regurgitation is moderate to severe.  ? 7. The aortic valve is tricuspid. There is mild calcification of the  ?aortic valve. There is mild thickening of the aortic valve. Aortic valve  ?regurgitation is mild to moderate.  ? 8. Aortic dilatation noted. There is moderate-to-severe dilatation of the  ?ascending aorta, measuring 49 mm. There is mild dilatation of the aortic  ?root, measuring 40 mm.  ? ?Comparison(s): Compared to prior TTE in 04/2021, the mitral regurgitation  ?appears moderate on current study which is slightly less than prior in the  ?setting of a lower systolic blood pressure. The ascending aortic size  ?remains stably dilated at 467m ?(previously 5042m  ? ?TTE 04/2021: ?IMPRESSIONS  ? 1. Left ventricular ejection fraction, by estimation, is 60 to 65%. The left ventricle has normal function. The left ventricle has no regional wall motion  abnormalities. There is moderate asymmetric hypertrophy of the basal septum. The rest of the LV  ?segments demonstrate mild left ventricular hypertrophy. Left ventricular diastolic parameters are consistent with

## 2022-02-15 DIAGNOSIS — I480 Paroxysmal atrial fibrillation: Secondary | ICD-10-CM | POA: Diagnosis not present

## 2022-02-15 DIAGNOSIS — E039 Hypothyroidism, unspecified: Secondary | ICD-10-CM | POA: Diagnosis not present

## 2022-02-15 DIAGNOSIS — I509 Heart failure, unspecified: Secondary | ICD-10-CM | POA: Diagnosis not present

## 2022-02-15 DIAGNOSIS — E7800 Pure hypercholesterolemia, unspecified: Secondary | ICD-10-CM | POA: Diagnosis not present

## 2022-02-15 DIAGNOSIS — I110 Hypertensive heart disease with heart failure: Secondary | ICD-10-CM | POA: Diagnosis not present

## 2022-02-15 DIAGNOSIS — I080 Rheumatic disorders of both mitral and aortic valves: Secondary | ICD-10-CM | POA: Diagnosis not present

## 2022-02-16 ENCOUNTER — Encounter: Payer: Self-pay | Admitting: Cardiology

## 2022-02-16 ENCOUNTER — Ambulatory Visit (INDEPENDENT_AMBULATORY_CARE_PROVIDER_SITE_OTHER): Payer: Medicare Other | Admitting: Cardiology

## 2022-02-16 VITALS — BP 140/82 | HR 76 | Ht 59.0 in | Wt 104.2 lb

## 2022-02-16 DIAGNOSIS — I7121 Aneurysm of the ascending aorta, without rupture: Secondary | ICD-10-CM | POA: Diagnosis not present

## 2022-02-16 DIAGNOSIS — I340 Nonrheumatic mitral (valve) insufficiency: Secondary | ICD-10-CM

## 2022-02-16 DIAGNOSIS — I38 Endocarditis, valve unspecified: Secondary | ICD-10-CM | POA: Diagnosis not present

## 2022-02-16 DIAGNOSIS — I5032 Chronic diastolic (congestive) heart failure: Secondary | ICD-10-CM | POA: Diagnosis not present

## 2022-02-16 DIAGNOSIS — R2681 Unsteadiness on feet: Secondary | ICD-10-CM | POA: Diagnosis not present

## 2022-02-16 DIAGNOSIS — I480 Paroxysmal atrial fibrillation: Secondary | ICD-10-CM | POA: Diagnosis not present

## 2022-02-16 DIAGNOSIS — I10 Essential (primary) hypertension: Secondary | ICD-10-CM

## 2022-02-16 NOTE — Patient Instructions (Signed)
Medication Instructions:  ? ?Your physician recommends that you continue on your current medications as directed. Please refer to the Current Medication list given to you today. ? ?*If you need a refill on your cardiac medications before your next appointment, please call your pharmacy* ? ? ? ?Follow-Up: ? ?3 MONTHS WITH AN EXTENDER OR DR. Johney Frame IN THE OFFICE ?  ? ? ?Other Instructions ? ?--PLEASE CALL YOUR PCP TODAY AND INQUIRE IF YOU NEED A URINE SAMPLE DONE, BEING YOU DID NOT FINISH OUT THE ENTIRE COURSE OF THE ANTIBIOTIC PCP PRESCRIBED YOU, DUE TO ALLERGIC REACTION TO THAT MED. ? ?

## 2022-02-16 NOTE — Progress Notes (Signed)
?Cardiology Office Note:   ? ?Date:  02/16/2022  ? ?ID:  [name], DOB 1936-03-24, MRN [id number] ? ?PCP:  Annette Orn, MD ?  ?[facility] Providers ?Cardiologist:  Annette Bergeron, MD { ? ?Referring MD: Annette Orn, MD  ? ? ?History of Present Illness:   ? ?[name] is a 86 y.o. female with a hx of pAfib, chronic diastolic CHF, hypertension, HLD, thoracic aneurysm previously followed by Dr. Lucianne Lei [name]-not felt to be a candidate for repair given severe kyphosis who was previously followed by Dr. Meda [name] who now returns to clinic for follow-up. ? ?Per review of the record, TTE 11/28/2016 with LVEF 55 to 60%, mild to moderate AI, moderate MR, severe atrial enlargement, severe TR.  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 Also with paroxysmal Afib s/p spontaneously conversion to normal sinus rhythm on diltiazem and flecainide.  Myoview 01/2017 poor quality study but low risk EF 72%.  Echo 07/2018 EF 55 to 60% with grade 1 DD mildly dilated ascending aorta with moderate MVP with MR and moderate to severe dilated LA moderate TR moderate pulmonary hypertension. ? ?Last seen in clinic on 03/22/21 where she was feeling okay with mild dyspnea on exertion. TTE at that time showed moderate holosystolic prolapse with moderate-to-severe MR with possible P2 flail. Saw Dr. Burt [name] on 06/05/21 where she was deemed best for medical management given relative lack of symptoms and comorbidities. Repeat TTE showed stable moderate vs moderate-to-severe MR.  ? ?Last seen in clinic on 11/2021 where she was dealing with instability with walking.  ? ?Today, she reports she is still unsteady on her feet. She has had no falls and is using a walker. She stills feels lightheaded at random times and fatigued. She does not check her blood pressure at home and cannot correlate her lightheadedness with her blood pressure. ?She stopped  taking magnesium because  it was making her legs hurt. It also caused acid reflux and chest pain. ?She reports the cramps are better at this time.  ?She was given nitrofurantoin at the urologist but reports it caused her left leg to be swollen, hot and red. She stopped taking the pills. LE doppler negative for DVT. Notably, she did not complete ABX for UTI. She will call her primary to discuss further. No current urinary symptoms. ?She reports she stills feels "pins and needles" in her legs and arms. She is curious if she has neuropathy. ? ?She denies chest pain, shortness of breath, palpitations, headaches, syncope, LE edema, orthopnea, PND.  ? ?Past Medical History:  ?Diagnosis Date  ? Anxiety   ? Aortic regurgitation   ? Breast cancer ([place]) 08/20/2011  ? R breast DCIS, ER/PR +  ? Cataract 3 and 10/92  ? bilateral  ? Chronic diastolic CHF (congestive heart failure) ([place])   ? Compression fracture of fourth lumbar vertebra (HCC)   ? DVT (deep venous thrombosis) ([place]) 08/2011  ? LL extremity   ? Fibromyalgia   ? Fracture lumbar vertebra-closed ([place])   ? Fracture of thoracic vertebra, closed ([place])   ? GERD (gastroesophageal reflux disease)   ? H/O hiatal hernia   ? History of blood clots   ? History of radiation therapy 01/2012  ? R breast  ? Hypercholesteremia   ? Hypertension   ? DR [name]  ? Hypothyroidism   ? Mitral regurgitation   ? Mitral valve prolapse   ?  Osteoporosis   ? PAF (paroxysmal atrial fibrillation) ([place])   ? a. dx 11/2016.  ? Pulmonary hypertension ([place])   ? Rib fractures   ? Thoracic ascending aortic aneurysm ([place])   ? a. followed by [name].  ? Tricuspid regurgitation   ? ? ?Past Surgical History:  ?Procedure Laterality Date  ? ANTERIOR CERVICAL DECOMP/DISCECTOMY FUSION N/A 09/09/2015  ? Procedure: Anterior Cervical Decompression and Fusion Cervical seven-Thorasic one ;  Surgeon: Annette Levine, MD;  Location: [place] NEURO ORS;  Service: Neurosurgery;  Laterality: N/A;  ? APPENDECTOMY  1940  ? BREAST SURGERY    ? CATARACT  EXTRACTION  1992  ? [place]  ? HERNIA REPAIR  10/16/2006  ? [facility] - Dr [name]  ? KYPHOPLASTY N/A 01/26/2013  ? Procedure: KYPHOPLASTY;  Surgeon: Annette Miss, MD;  Location: [place] NEURO ORS;  Service: Neurosurgery;  Laterality: N/A;  T11 and L1  ? KYPHOPLASTY N/A 06/21/2017  ? Procedure: Lumbar four Kyphoplasty;  Surgeon: Annette Levine, MD;  Location: [place];  Service: Neurosurgery;  Laterality: N/A;  ? MASTECTOMY, PARTIAL  10/17/2011  ? Procedure: MASTECTOMY PARTIAL;  Surgeon: Annette Lasso, MD;  Location: [place];  Service: General;  Laterality: Right;  needle guided  ? TEE WITHOUT CARDIOVERSION N/A 12/04/2016  ? Procedure: TRANSESOPHAGEAL ECHOCARDIOGRAM (TEE);  Surgeon: Annette Casino, MD;  Location[facility] ENDOSCOPY;  Service: Cardiovascular;  Laterality: N/A;  ? TONSILLECTOMY  1944  ? ? ?Current Medications: ?Current Meds  ?Medication Sig  ? apixaban (ELIQUIS) 2.5 MG TABS tablet Take 1 tablet (2.5 mg total) by mouth 2 (two) times daily.  ? BONIVA 150 MG tablet Take 150 mg by mouth every 30 (thirty) days.  ? calcium citrate-vitamin D (CITRACAL+D) 315-200 MG-UNIT per tablet Take 1 tablet by mouth 2 (two) times daily.  ? Camphor-Eucalyptus-Menthol (VICKS VAPORUB EX) Apply 1 application topically daily as needed (toenail fungus).   ? cholecalciferol (VITAMIN D) 1000 UNITS tablet Take 1,000 Units by mouth daily with lunch. Vitamin D3  ? clotrimazole-betamethasone (LOTRISONE) cream Apply topically 3 (three) times daily as needed.  ? conjugated estrogens (PREMARIN) vaginal cream Place 1 Applicatorful vaginally 2 (two) times a week.  ? D-Mannose 500 MG CAPS Take by mouth daily.  ? diltiazem (CARDIZEM CD) 180 MG 24 hr capsule Take 1 capsule daily  ? estradiol (ESTRACE) 0.1 MG/GM vaginal cream Place 1 Applicatorful vaginally once a week.  ? furosemide (LASIX) 20 MG tablet Take 1 tablet (20 mg total) by mouth 3 (three) times a week. Take on Monday, Wed, and Fridays.  ? HYDROcodone-acetaminophen (NORCO/VICODIN) 5-325  MG tablet Take 1-2 tablets by mouth every 4 (four) hours as needed (mild pain).  ? iron polysaccharides (NIFEREX) 150 MG capsule Take 150 mg by mouth 3 (three) times a week.   ? levothyroxine (SYNTHROID, LEVOTHROID) 125 MCG tablet Take 125 mcg by mouth daily before breakfast.  ? losartan (COZAAR) 25 MG tablet Take 1 tablet (25 mg total) by mouth daily.  ? Magnesium 200 MG TABS Take 1 tablet (200 mg total) by mouth daily.  ? Meclizine HCl (BONINE PO) Take by mouth as needed.  ? Multiple Vitamin (MULTIVITAMIN) tablet Take 1 tablet by mouth daily with breakfast.  ? omeprazole (PRILOSEC OTC) 20 MG tablet Take 20 mg by mouth daily before breakfast.   ? potassium chloride (KLOR-CON M) 10 MEQ tablet Take 1 tablet (10 mEq total) by mouth 3 (three) times a week. Take on Mon, Wed, and Fridays with lasix.  ?  pravastatin (PRAVACHOL) 20 MG tablet Take 20 mg by mouth daily with supper.   ? risedronate (ACTONEL) 150 MG tablet Take 150 mg by mouth every 30 (thirty) days. with water on empty stomach, nothing by mouth or lie down for next 30 minutes.  ? solifenacin (VESICARE) 5 MG tablet Take 5 mg by mouth daily with breakfast.   ?  ? ?Allergies:   Contrast media [iodinated contrast media], Iohexol, Mucinex [guaifenesin er], Zanaflex [tizanidine hcl], Penicillins, and Robaxin [methocarbamol]  ? ?Social History  ? ?Socioeconomic History  ? Marital status: Married  ?  Spouse name: Not on file  ? Number of children: Not on file  ? Years of education: Not on file  ? Highest education level: Not on file  ?Occupational History  ? Not on file  ?Tobacco Use  ? Smoking status: Never  ? Smokeless tobacco: Never  ?Vaping Use  ? Vaping Use: Never used  ?Substance and Sexual Activity  ? Alcohol use: No  ? Drug use: No  ? Sexual activity: Never  ?Other Topics Concern  ? Not on file  ?Social History Narrative  ? Not on file  ? ?Social Determinants of Health  ? ?Financial Resource Strain: Not on file  ?Food Insecurity: Not on file  ?Transportation  Needs: Not on file  ?Physical Activity: Not on file  ?Stress: Not on file  ?Social Connections: Not on file  ?  ? ?Family History: ?The patient's family history includes Heart disease in her maternal

## 2022-02-20 DIAGNOSIS — Z7901 Long term (current) use of anticoagulants: Secondary | ICD-10-CM | POA: Diagnosis not present

## 2022-02-20 DIAGNOSIS — Z9181 History of falling: Secondary | ICD-10-CM | POA: Diagnosis not present

## 2022-02-20 DIAGNOSIS — E039 Hypothyroidism, unspecified: Secondary | ICD-10-CM | POA: Diagnosis not present

## 2022-02-20 DIAGNOSIS — I080 Rheumatic disorders of both mitral and aortic valves: Secondary | ICD-10-CM | POA: Diagnosis not present

## 2022-02-20 DIAGNOSIS — M810 Age-related osteoporosis without current pathological fracture: Secondary | ICD-10-CM | POA: Diagnosis not present

## 2022-02-20 DIAGNOSIS — I872 Venous insufficiency (chronic) (peripheral): Secondary | ICD-10-CM | POA: Diagnosis not present

## 2022-02-20 DIAGNOSIS — M7072 Other bursitis of hip, left hip: Secondary | ICD-10-CM | POA: Diagnosis not present

## 2022-02-20 DIAGNOSIS — Z8572 Personal history of non-Hodgkin lymphomas: Secondary | ICD-10-CM | POA: Diagnosis not present

## 2022-02-20 DIAGNOSIS — I480 Paroxysmal atrial fibrillation: Secondary | ICD-10-CM | POA: Diagnosis not present

## 2022-02-20 DIAGNOSIS — N3281 Overactive bladder: Secondary | ICD-10-CM | POA: Diagnosis not present

## 2022-02-20 DIAGNOSIS — I110 Hypertensive heart disease with heart failure: Secondary | ICD-10-CM | POA: Diagnosis not present

## 2022-02-20 DIAGNOSIS — M797 Fibromyalgia: Secondary | ICD-10-CM | POA: Diagnosis not present

## 2022-02-20 DIAGNOSIS — R350 Frequency of micturition: Secondary | ICD-10-CM | POA: Diagnosis not present

## 2022-02-20 DIAGNOSIS — Z86718 Personal history of other venous thrombosis and embolism: Secondary | ICD-10-CM | POA: Diagnosis not present

## 2022-02-20 DIAGNOSIS — M1990 Unspecified osteoarthritis, unspecified site: Secondary | ICD-10-CM | POA: Diagnosis not present

## 2022-02-20 DIAGNOSIS — Z853 Personal history of malignant neoplasm of breast: Secondary | ICD-10-CM | POA: Diagnosis not present

## 2022-02-20 DIAGNOSIS — I509 Heart failure, unspecified: Secondary | ICD-10-CM | POA: Diagnosis not present

## 2022-02-20 DIAGNOSIS — M353 Polymyalgia rheumatica: Secondary | ICD-10-CM | POA: Diagnosis not present

## 2022-02-20 DIAGNOSIS — Z79891 Long term (current) use of opiate analgesic: Secondary | ICD-10-CM | POA: Diagnosis not present

## 2022-02-20 DIAGNOSIS — E7800 Pure hypercholesterolemia, unspecified: Secondary | ICD-10-CM | POA: Diagnosis not present

## 2022-02-20 DIAGNOSIS — K219 Gastro-esophageal reflux disease without esophagitis: Secondary | ICD-10-CM | POA: Diagnosis not present

## 2022-02-21 DIAGNOSIS — I080 Rheumatic disorders of both mitral and aortic valves: Secondary | ICD-10-CM | POA: Diagnosis not present

## 2022-02-21 DIAGNOSIS — E7800 Pure hypercholesterolemia, unspecified: Secondary | ICD-10-CM | POA: Diagnosis not present

## 2022-02-21 DIAGNOSIS — I509 Heart failure, unspecified: Secondary | ICD-10-CM | POA: Diagnosis not present

## 2022-02-21 DIAGNOSIS — E039 Hypothyroidism, unspecified: Secondary | ICD-10-CM | POA: Diagnosis not present

## 2022-02-21 DIAGNOSIS — I110 Hypertensive heart disease with heart failure: Secondary | ICD-10-CM | POA: Diagnosis not present

## 2022-02-21 DIAGNOSIS — I480 Paroxysmal atrial fibrillation: Secondary | ICD-10-CM | POA: Diagnosis not present

## 2022-02-23 DIAGNOSIS — R309 Painful micturition, unspecified: Secondary | ICD-10-CM | POA: Diagnosis not present

## 2022-02-27 DIAGNOSIS — I110 Hypertensive heart disease with heart failure: Secondary | ICD-10-CM | POA: Diagnosis not present

## 2022-02-27 DIAGNOSIS — I080 Rheumatic disorders of both mitral and aortic valves: Secondary | ICD-10-CM | POA: Diagnosis not present

## 2022-02-27 DIAGNOSIS — E039 Hypothyroidism, unspecified: Secondary | ICD-10-CM | POA: Diagnosis not present

## 2022-02-27 DIAGNOSIS — I480 Paroxysmal atrial fibrillation: Secondary | ICD-10-CM | POA: Diagnosis not present

## 2022-02-27 DIAGNOSIS — I509 Heart failure, unspecified: Secondary | ICD-10-CM | POA: Diagnosis not present

## 2022-02-27 DIAGNOSIS — E7800 Pure hypercholesterolemia, unspecified: Secondary | ICD-10-CM | POA: Diagnosis not present

## 2022-03-01 DIAGNOSIS — M810 Age-related osteoporosis without current pathological fracture: Secondary | ICD-10-CM | POA: Diagnosis not present

## 2022-03-01 DIAGNOSIS — I700 Atherosclerosis of aorta: Secondary | ICD-10-CM | POA: Diagnosis not present

## 2022-03-01 DIAGNOSIS — I509 Heart failure, unspecified: Secondary | ICD-10-CM | POA: Diagnosis not present

## 2022-03-01 DIAGNOSIS — I10 Essential (primary) hypertension: Secondary | ICD-10-CM | POA: Diagnosis not present

## 2022-03-01 DIAGNOSIS — R202 Paresthesia of skin: Secondary | ICD-10-CM | POA: Diagnosis not present

## 2022-03-01 DIAGNOSIS — I872 Venous insufficiency (chronic) (peripheral): Secondary | ICD-10-CM | POA: Diagnosis not present

## 2022-03-01 DIAGNOSIS — I480 Paroxysmal atrial fibrillation: Secondary | ICD-10-CM | POA: Diagnosis not present

## 2022-03-06 DIAGNOSIS — E039 Hypothyroidism, unspecified: Secondary | ICD-10-CM | POA: Diagnosis not present

## 2022-03-06 DIAGNOSIS — E7800 Pure hypercholesterolemia, unspecified: Secondary | ICD-10-CM | POA: Diagnosis not present

## 2022-03-06 DIAGNOSIS — I080 Rheumatic disorders of both mitral and aortic valves: Secondary | ICD-10-CM | POA: Diagnosis not present

## 2022-03-06 DIAGNOSIS — I110 Hypertensive heart disease with heart failure: Secondary | ICD-10-CM | POA: Diagnosis not present

## 2022-03-06 DIAGNOSIS — I480 Paroxysmal atrial fibrillation: Secondary | ICD-10-CM | POA: Diagnosis not present

## 2022-03-06 DIAGNOSIS — I509 Heart failure, unspecified: Secondary | ICD-10-CM | POA: Diagnosis not present

## 2022-03-08 DIAGNOSIS — N8111 Cystocele, midline: Secondary | ICD-10-CM | POA: Diagnosis not present

## 2022-03-08 DIAGNOSIS — Z4689 Encounter for fitting and adjustment of other specified devices: Secondary | ICD-10-CM | POA: Diagnosis not present

## 2022-03-13 DIAGNOSIS — I110 Hypertensive heart disease with heart failure: Secondary | ICD-10-CM | POA: Diagnosis not present

## 2022-03-13 DIAGNOSIS — I509 Heart failure, unspecified: Secondary | ICD-10-CM | POA: Diagnosis not present

## 2022-03-13 DIAGNOSIS — I480 Paroxysmal atrial fibrillation: Secondary | ICD-10-CM | POA: Diagnosis not present

## 2022-03-13 DIAGNOSIS — I080 Rheumatic disorders of both mitral and aortic valves: Secondary | ICD-10-CM | POA: Diagnosis not present

## 2022-03-13 DIAGNOSIS — E7800 Pure hypercholesterolemia, unspecified: Secondary | ICD-10-CM | POA: Diagnosis not present

## 2022-03-13 DIAGNOSIS — E039 Hypothyroidism, unspecified: Secondary | ICD-10-CM | POA: Diagnosis not present

## 2022-03-20 DIAGNOSIS — I080 Rheumatic disorders of both mitral and aortic valves: Secondary | ICD-10-CM | POA: Diagnosis not present

## 2022-03-20 DIAGNOSIS — I480 Paroxysmal atrial fibrillation: Secondary | ICD-10-CM | POA: Diagnosis not present

## 2022-03-20 DIAGNOSIS — I110 Hypertensive heart disease with heart failure: Secondary | ICD-10-CM | POA: Diagnosis not present

## 2022-03-20 DIAGNOSIS — E7800 Pure hypercholesterolemia, unspecified: Secondary | ICD-10-CM | POA: Diagnosis not present

## 2022-03-20 DIAGNOSIS — I509 Heart failure, unspecified: Secondary | ICD-10-CM | POA: Diagnosis not present

## 2022-03-20 DIAGNOSIS — E039 Hypothyroidism, unspecified: Secondary | ICD-10-CM | POA: Diagnosis not present

## 2022-03-27 ENCOUNTER — Other Ambulatory Visit: Payer: Self-pay

## 2022-03-27 MED ORDER — DILTIAZEM HCL ER COATED BEADS 180 MG PO CP24: ORAL_CAPSULE | ORAL | 3 refills | Status: DC

## 2022-04-17 DIAGNOSIS — R42 Dizziness and giddiness: Secondary | ICD-10-CM | POA: Diagnosis not present

## 2022-04-17 DIAGNOSIS — L03115 Cellulitis of right lower limb: Secondary | ICD-10-CM | POA: Diagnosis not present

## 2022-04-25 DIAGNOSIS — L03115 Cellulitis of right lower limb: Secondary | ICD-10-CM | POA: Diagnosis not present

## 2022-04-30 ENCOUNTER — Other Ambulatory Visit: Payer: Self-pay | Admitting: *Deleted

## 2022-04-30 DIAGNOSIS — I480 Paroxysmal atrial fibrillation: Secondary | ICD-10-CM

## 2022-04-30 MED ORDER — APIXABAN 2.5 MG PO TABS: 2.5000 mg | ORAL_TABLET | Freq: Two times a day (BID) | ORAL | 5 refills | Status: DC

## 2022-04-30 NOTE — Telephone Encounter (Signed)
Eliquis 2.'5mg'$  paper refill request received. Patient is 86 years old, weight-47.3kg, Crea-0.74 on 11/23/2021, Diagnosis-Afib, and last seen by Dr. Johney Frame on 02/16/2022. Dose is appropriate based on dosing criteria. Will send in refill to requested pharmacy.

## 2022-05-01 DIAGNOSIS — L03115 Cellulitis of right lower limb: Secondary | ICD-10-CM | POA: Diagnosis not present

## 2022-05-01 DIAGNOSIS — I872 Venous insufficiency (chronic) (peripheral): Secondary | ICD-10-CM | POA: Diagnosis not present

## 2022-05-01 DIAGNOSIS — R609 Edema, unspecified: Secondary | ICD-10-CM | POA: Diagnosis not present

## 2022-05-17 NOTE — Progress Notes (Deleted)
Cardiology Office Note:    Date:  05/21/2022   ID:  [name], DOB 12/03/1935, MRN [id number]  PCP:  [name], MD   [facility] Providers Cardiologist:  Freada Bergeron, MD {  Referring MD: [name], MD    History of Present Illness:    [name] is a 86 y.o. female with a hx of pAfib, chronic diastolic CHF, hypertension, HLD, thoracic aneurysm previously followed by Dr. Lucianne Lei Trigt-not felt to be a candidate for repair given severe kyphosis who was previously followed by Dr. Meda Coffee who now returns to clinic for follow-up.  Per review of the record, TTE 11/28/2016 with LVEF 55 to 60%, mild to moderate AI, moderate MR, severe atrial enlargement, severe TR.  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 Also with paroxysmal Afib s/p spontaneously conversion to normal sinus rhythm on diltiazem and flecainide.  Myoview 01/2017 poor quality study but low risk EF 72%.  Echo 07/2018 EF 55 to 60% with grade 1 DD mildly dilated ascending aorta with moderate MVP with MR and moderate to severe dilated LA moderate TR moderate pulmonary hypertension.  Seen in clinic 03/22/21 where she was feeling okay with mild dyspnea on exertion. TTE at that time showed moderate holosystolic prolapse with moderate-to-severe MR with possible P2 flail. Saw Dr. Burt Knack on 06/05/21 where she was deemed best for medical management given relative lack of symptoms and comorbidities. Repeat TTE showed stable moderate vs moderate-to-severe MR.   Was last seen in clinic on 02/16/22 where she was dealing with a UTI and LLE pain and swelling. LE doppler was negative for DVT. We recommended she follow-up with her PCP for her UTI. She was otherwise stable from a CV standpoint. We stopped flecainide at that time due to advanced age and comorbidities.  Today, ***  Past Medical History:  Diagnosis Date   Anxiety    Aortic regurgitation     Breast cancer ([place]) 08/20/2011   R breast DCIS, ER/PR +   Cataract 3 and 10/92   bilateral   Chronic diastolic CHF (congestive heart failure) (HCC)    Compression fracture of fourth lumbar vertebra (HCC)    DVT (deep venous thrombosis) ([place]) 08/2011   LL extremity    Fibromyalgia    Fracture lumbar vertebra-closed ([place])    Fracture of thoracic vertebra, closed ([place])    GERD (gastroesophageal reflux disease)    H/O hiatal hernia    History of blood clots    History of radiation therapy 01/2012   R breast   Hypercholesteremia    Hypertension    DR Orinda Kenner   Hypothyroidism    Mitral regurgitation    Mitral valve prolapse    Osteoporosis    PAF (paroxysmal atrial fibrillation) ([place])    a. dx 11/2016.   Pulmonary hypertension ([place])    Rib fractures    Thoracic ascending aortic aneurysm ([place])    a. followed by Dr. Prescott Gum.   Tricuspid regurgitation     Past Surgical History:  Procedure Laterality Date   ANTERIOR CERVICAL DECOMP/DISCECTOMY FUSION N/A 09/09/2015   Procedure: Anterior Cervical Decompression and Fusion Cervical seven-Thorasic one ;  Surgeon: Erline Levine, MD;  Location: [place] [facility];  Service: Neurosurgery;  Laterality: N/A;   [place]  REPAIR  10/16/2006   RIH - Dr Hassell Done   KYPHOPLASTY N/A 01/26/2013   Procedure: KYPHOPLASTY;  Surgeon: Kristeen Miss, MD;  Location: [place] [facility];  Service: Neurosurgery;  Laterality: N/A;  T11 and L1   KYPHOPLASTY N/A 06/21/2017   Procedure: Lumbar four Kyphoplasty;  Surgeon: Erline Levine, MD;  Location: [facility];  Service: Neurosurgery;  Laterality: N/A;   MASTECTOMY, PARTIAL  10/17/2011   Procedure: MASTECTOMY PARTIAL;  Surgeon: Haywood Lasso, MD;  Location: [facility];  Service: General;  Laterality: Right;  needle guided   TEE WITHOUT CARDIOVERSION N/A 12/04/2016   Procedure: TRANSESOPHAGEAL ECHOCARDIOGRAM (TEE);  Surgeon: Pixie Casino,  MD;  Location: [facility] ENDOSCOPY;  Service: Cardiovascular;  Laterality: N/A;   TONSILLECTOMY  1944    Current Medications: Current Meds  Medication Sig   apixaban (ELIQUIS) 2.5 MG TABS tablet Take 1 tablet (2.5 mg total) by mouth 2 (two) times daily.   BONIVA 150 MG tablet Take 150 mg by mouth every 30 (thirty) days.   calcium citrate-vitamin D (CITRACAL+D) 315-200 MG-UNIT per tablet Take 1 tablet by mouth 2 (two) times daily.   Camphor-Eucalyptus-Menthol (VICKS VAPORUB EX) Apply 1 application topically daily as needed (toenail fungus).    cholecalciferol (VITAMIN D) 1000 UNITS tablet Take 1,000 Units by mouth daily with lunch. Vitamin D3   conjugated estrogens (PREMARIN) vaginal cream Place 1 Applicatorful vaginally 2 (two) times a week.   D-Mannose 500 MG CAPS Take by mouth daily.   diltiazem (CARDIZEM CD) 180 MG 24 hr capsule Take 1 capsule daily   estradiol (ESTRACE) 0.1 MG/GM vaginal cream Place 1 Applicatorful vaginally once a week.   furosemide (LASIX) 20 MG tablet Take 1 tablet (20 mg total) by mouth 3 (three) times a week. Take on Monday, Wed, and Fridays.   HYDROcodone-acetaminophen (NORCO/VICODIN) 5-325 MG tablet Take 1-2 tablets by mouth every 4 (four) hours as needed (mild pain).   iron polysaccharides (NIFEREX) 150 MG capsule Take 150 mg by mouth 3 (three) times a week.    levothyroxine (SYNTHROID, LEVOTHROID) 125 MCG tablet Take 125 mcg by mouth daily before breakfast.   losartan (COZAAR) 25 MG tablet Take 1 tablet (25 mg total) by mouth daily.   Multiple Vitamin (MULTIVITAMIN) tablet Take 1 tablet by mouth daily with breakfast.   omeprazole (PRILOSEC OTC) 20 MG tablet Take 20 mg by mouth daily before breakfast.    potassium chloride (KLOR-CON) 10 MEQ tablet Take 10 mEq by mouth 3 (three) times a week.   pravastatin (PRAVACHOL) 20 MG tablet Take 20 mg by mouth daily with supper.    risedronate (ACTONEL) 150 MG tablet Take 150 mg by mouth every 30 (thirty) days. with water on empty  stomach, nothing by mouth or lie down for next 30 minutes.   solifenacin (VESICARE) 5 MG tablet Take 5 mg by mouth daily with breakfast.      Allergies:   Contrast media [iodinated contrast media], Iohexol, Mucinex [guaifenesin er], Zanaflex [tizanidine hcl], Penicillins, and Robaxin [methocarbamol]   Social History   Socioeconomic History   Marital status: Married    Spouse name: Not on file   Number of children: Not on file   Years of education: Not on file   Highest education level: Not on file  Occupational History   Not on file  Tobacco Use   Smoking status: Never   Smokeless tobacco: Never  Vaping Use   Vaping Use: Never used  Substance and Sexual Activity   Alcohol use: No  Drug use: No   Sexual activity: Never  Other Topics Concern   Not on file  Social History Narrative   Not on file   Social Determinants of Health   Financial Resource Strain: Not on file  Food Insecurity: Not on file  Transportation Needs: Not on file  Physical Activity: Not on file  Stress: Not on file  Social Connections: Not on file     Family History: The patient's family history includes Heart disease in her maternal grandfather, maternal uncle, and mother.  ROS:   Please see the history of present illness.    Review of Systems  Constitutional:  Positive for malaise/fatigue. Negative for chills and fever.  HENT:  Negative for sore throat.   Eyes:  Negative for blurred vision.  Respiratory:  Negative for shortness of breath.   Cardiovascular:  Negative for chest pain, palpitations, orthopnea, claudication and PND.  Gastrointestinal:  Negative for nausea and vomiting.  Genitourinary:  Negative for hematuria.  Musculoskeletal:  Negative for back pain, joint pain and myalgias.  Skin:  Negative for itching and rash.  Neurological:  Positive for dizziness, tingling and weakness (Unsteady). Negative for loss of consciousness.  Endo/Heme/Allergies:  Negative for polydipsia.   Psychiatric/Behavioral:  Negative for substance abuse.     EKGs/Labs/Other Studies Reviewed:    The following studies were reviewed today: TTE 11/29/21: IMPRESSIONS   1. Left ventricular ejection fraction, by estimation, is 60 to 65%. The left ventricle has normal function. The left ventricle has no regional wall motion abnormalities. There is moderate hypertrophy of the basal septal segment. The rest of the LV  segments demonstrate mild asymmetric left ventricular hypertrophy of the basal-septal segment. Left ventricular diastolic parameters are consistent with Grade I diastolic dysfunction (impaired relaxation).   2. Right ventricular systolic function is normal. The right ventricular size is mildly enlarged. There is severely elevated pulmonary artery systolic pressure. The estimated right ventricular systolic pressure is  69.6 mmHg.   3. Left atrial size was severely dilated.   4. Right atrial size was mildly dilated.   5. The mitral valve is myxomatous. There is bileaflet prolapse that is more notable in the posterior mitral valve leaflet. There is moderate mitral regurgitation EROA 0.23 with Rvol 35m.   6. Tricuspid valve regurgitation is moderate to severe.   7. The aortic valve is tricuspid. There is mild calcification of the aortic valve. There is mild thickening of the aortic valve. Aortic valve regurgitation is mild to moderate.   8. Aortic dilatation noted. There is moderate-to-severe dilatation of the ascending aorta, measuring 49 mm. There is mild dilatation of the aortic root, measuring 40 mm.   Comparison(s): Compared to prior TTE in 04/2021, the mitral regurgitation  appears moderate on current study which is slightly less than prior in the  setting of a lower systolic blood pressure. The ascending aortic size  remains stably dilated at 430m (previously 5042m   TTE 04/2021: IMPRESSIONS   1. Left ventricular ejection fraction, by estimation, is 60 to 65%. The left  ventricle has normal function. The left ventricle has no regional wall motion abnormalities. There is moderate asymmetric hypertrophy of the basal septum. The rest of the LV  segments demonstrate mild left ventricular hypertrophy. Left ventricular diastolic parameters are consistent with Grade I diastolic dysfunction (impaired relaxation).   2. Right ventricular systolic function is normal. The right ventricular size is normal. There is mildly elevated pulmonary artery systolic pressure. The estimated right ventricular systolic pressure is  45.0 mmHg.   3. Left atrial size was severely dilated.   4. The mitral valve is myxomatous. There is moderate holosystolic prolapse of of the mitral valve most notably of the posterior mitral valve leaflet with possible P2 flail. Using a PISA radius of 0.9, there is at least moderate-to-severe mitral  regurgitation with EROA 0.3cm2, RVol 62m. Recommend TEE for further evaluation.   5. Tricuspid valve regurgitation is moderate.   6. The aortic valve is tricuspid. There is mild calcification of the  aortic valve. There is mild thickening of the aortic valve. Aortic valve regurgitation is mild to moderate. Mild to moderate aortic valve sclerosis/calcification is present, without any evidence of aortic stenosis.   7. Aortic dilatation noted. There is mild dilatation of the aortic root, measuring 39 mm. There is moderate-to-severe dilatation of the ascending aorta, measuring 50 mm.   8. The inferior vena cava is normal in size with greater than 50% respiratory variability, suggesting right atrial pressure of 3 mmHg.   9. Right atrial size was mildly dilated.   Comparison(s): Compared to prior echo, the MR now appears severe. The ascending aorta now measures 517m(previously 4164m  2D echo 08/06/2018 Study Conclusions   - Left ventricle: The cavity size was normal. There was severe    focal basal hypertrophy. Systolic function was normal. The    estimated ejection  fraction was in the range of 55% to 60%.   Wall motion was normal; there were no regional wall motion    abnormalities. There was an increased relative contribution of    atrial contraction to ventricular filling. Doppler parameters are    consistent with abnormal left ventricular relaxation (grade 1    diastolic dysfunction). Doppler parameters are consistent with    high ventricular filling pressure.  - Aortic valve: Trileaflet; mildly thickened, mildly calcified    leaflets. There was mild regurgitation.  - Aorta: Ascending aorta diameter: 41 mm (ED).  - Ascending aorta: The ascending aorta was mildly dilated.  - Mitral valve: Mild diffuse thickening, consistent with myxomatous    proliferation. Moderate, holosystolic prolapse, involving the    posterior > anterior leaflet. There was moderate regurgitation.    Effective regurgitant orifice (PISA): 0.15 cm^2. Regurgitant    volume (PISA): 32 ml.  - Left atrium: The atrium was moderately to severely dilated.  - Tricuspid valve: There was moderate regurgitation.  - Pulmonary arteries: PA peak pressure: 45 mm Hg (S).   Impressions:   - The right ventricular systolic pressure was increased consistent    with moderate pulmonary hypertension.  Myoview 01/2017: The nuclear study is very poor quality due to body habitus Defect 1: There is a defect present in the basal inferolateral and mid inferolateral location that is most likely artifact due to uptake in structures below the diaphragm. This is a low risk study. Nuclear stress EF: 72%. The left ventricular ejection fraction is hyperdynamic (>65%).     EKG:  02/16/2022: not ordered today 03/22/21: NSR, LAFB, anterior infarct (unchanged), first degree AVB, HR 65  Recent Labs: 11/23/2021: BUN 32; Creatinine, Ser 0.74; Magnesium 2.3; Potassium 4.3; Sodium 137  Recent Lipid Panel    Component Value Date/Time   CHOL  12/02/2010 0400    149        ATP III CLASSIFICATION:  <200     mg/dL    Desirable  200-239  mg/dL   Borderline High  >=240    mg/dL   High  TRIG 54 12/02/2010 0400   HDL 58 12/02/2010 0400   CHOLHDL 2.6 12/02/2010 0400   VLDL 11 12/02/2010 0400   LDLCALC  12/02/2010 0400    80        Total Cholesterol/HDL:CHD Risk Coronary Heart Disease Risk Table                     Men   Women  1/2 Average Risk   3.4   3.3  Average Risk       5.0   4.4  2 X Average Risk   9.6   7.1  3 X Average Risk  23.4   11.0        Use the calculated Patient Ratio above and the CHD Risk Table to determine the patient's CHD Risk.        ATP III CLASSIFICATION (LDL):  <100     mg/dL   Optimal  100-129  mg/dL   Near or Above                    Optimal  130-159  mg/dL   Borderline  160-189  mg/dL   High  >190     mg/dL   Very High     Risk Assessment/Calculations:    CHA2DS2-VASc Score =    This indicates a  % annual risk of stroke. The patient's score is based upon:      Physical Exam:    VS:  BP 130/90   Pulse 87   Ht '4\' 11"'$  (1.499 m)   Wt 98 lb 12.8 oz (44.8 kg)   SpO2 99%   BMI 19.96 kg/m     Wt Readings from Last 3 Encounters:  05/21/22 98 lb 12.8 oz (44.8 kg)  02/16/22 104 lb 3.2 oz (47.3 kg)  11/23/21 106 lb 9.6 oz (48.4 kg)     GEN: Elderly female, comfortable, Kyphotic. HEENT: Left eye strabismus NECK: No JVD; No carotid bruits CARDIAC: RRR, 3/6 systolic murmur heard throughout the precordium. No rubs, gallops RESPIRATORY:  Clear to auscultation without rales, wheezing or rhonchi  ABDOMEN: Soft, non-tender, non-distended MUSCULOSKELETAL:  no edema; warm. Has scoliosis SKIN: Warm and dry NEUROLOGIC:  Alert and oriented x 3 PSYCHIATRIC:  Normal affect   ASSESSMENT:    1. Chronic diastolic CHF (congestive heart failure) ([place])   2. Essential hypertension   3. PAF (paroxysmal atrial fibrillation) ([place])   4. Severe mitral regurgitation   5. Aneurysm of ascending aorta without rupture ([place])   6. Unsteady gait   7. Mitral valve  insufficiency, unspecified etiology      PLAN:    In order of problems listed above:  No problem-specific Assessment & Plan notes found for this encounter.    #Unsteady Gait: #Possible lightheadedness: Patient reports that she feels off balance with episodes of lightheadedness but unclear if this is true lightheadedness or unsteady gait. No episodes of hypotension that she knows of. She has regular meals and drinks water. Asked her to take her blood pressure as able when she has the episodes but has already kept a thorough log for PCP and reportedly no issues. -Will repeat BP log to review -Appears euvolemic  #Paroxysmal Afib: CHADs-vasc 6. On apixaban and flecainide (has been on long term). No palpitations -Stopped flecainide given valve disease and advanced age -Continue apixaban '5mg'$  BID -Continue diltiazem '180mg'$  daily -ECG in 11/2021 shows she remains in NSR   #Chronic diastolic CHF: TTE 83/41/96 with LVEF 60-65%,  moderate vs moderate to severe MR due to bileaflet prolapse. Currently euvolemic on exam. -Continue lasix '20mg'$  M, W, F -Continue potassium chloride '10mg'$ Eq M, W, F -Continue losartan '25mg'$  daily  #Thoracic aortic aneurysm: Previously followed by Dr. Prescott Gum now transitioned to Dr. Orvan Seen. Managed conservatively as deemed to be not a surgical candidate -Continue losartan '25mg'$  daily -Continue pravsatatin '20mg'$    #Valvular heart disease with moderate MVP and moderate-to-severe MR, moderate TR mild AI and moderate pulmonary hypertension:  TTE 11/17/21 with LVEF 60-65%, moderate vs moderate to severe MR due to bileaflet prolapse. Given relative lack of symptoms and medical comorbidities, managing medically at this time.  -Medical management -Continue lasix '20mg'$  M, W, F with K 42mq on those days -Continue losartan and dilt as above; BP well controlled   #Hyperlipidemia: -Continue pravasatin '20mg'$  daily   Follow-up in 3 months.   Medication Adjustments/Labs and  Tests Ordered: Current medicines are reviewed at length with the patient today.  Concerns regarding medicines are outlined above.  No orders of the defined types were placed in this encounter.  No orders of the defined types were placed in this encounter.   There are no Patient Instructions on file for this visit.     I,Zite Okoli,acting as a sEducation administratorfor HFreada Bergeron MD.,have documented all relevant documentation on the behalf of HFreada Bergeron MD,as directed by  HFreada Bergeron MD while in the presence of HFreada Bergeron MD.   I, HFreada Bergeron MD, have reviewed all documentation for this visit. The documentation on 05/21/22 for the exam, diagnosis, procedures, and orders are all accurate and complete.   Signed, HFreada Bergeron MD  05/21/2022 10:37 AM    [place]

## 2022-05-18 ENCOUNTER — Ambulatory Visit: Payer: Medicare Other | Admitting: Cardiology

## 2022-05-21 ENCOUNTER — Encounter: Payer: Self-pay | Admitting: Cardiology

## 2022-05-21 ENCOUNTER — Ambulatory Visit (INDEPENDENT_AMBULATORY_CARE_PROVIDER_SITE_OTHER): Payer: Medicare Other | Admitting: Cardiology

## 2022-05-21 VITALS — BP 130/90 | HR 87 | Ht 59.0 in | Wt 98.8 lb

## 2022-05-21 DIAGNOSIS — I7121 Aneurysm of the ascending aorta, without rupture: Secondary | ICD-10-CM

## 2022-05-21 DIAGNOSIS — M79605 Pain in left leg: Secondary | ICD-10-CM

## 2022-05-21 DIAGNOSIS — G8929 Other chronic pain: Secondary | ICD-10-CM | POA: Diagnosis not present

## 2022-05-21 DIAGNOSIS — M79604 Pain in right leg: Secondary | ICD-10-CM | POA: Diagnosis not present

## 2022-05-21 DIAGNOSIS — I5032 Chronic diastolic (congestive) heart failure: Secondary | ICD-10-CM

## 2022-05-21 DIAGNOSIS — M546 Pain in thoracic spine: Secondary | ICD-10-CM | POA: Diagnosis not present

## 2022-05-21 DIAGNOSIS — I340 Nonrheumatic mitral (valve) insufficiency: Secondary | ICD-10-CM | POA: Diagnosis not present

## 2022-05-21 DIAGNOSIS — R2681 Unsteadiness on feet: Secondary | ICD-10-CM | POA: Diagnosis not present

## 2022-05-21 DIAGNOSIS — I10 Essential (primary) hypertension: Secondary | ICD-10-CM | POA: Diagnosis not present

## 2022-05-21 DIAGNOSIS — I480 Paroxysmal atrial fibrillation: Secondary | ICD-10-CM

## 2022-05-21 MED ORDER — FUROSEMIDE 20 MG PO TABS: ORAL_TABLET | ORAL | 3 refills | Status: DC

## 2022-05-21 NOTE — Progress Notes (Signed)
Cardiology Office Note:    Date:  05/21/2022   ID:  [name], DOB 1936/06/06, MRN [id number]  PCP:  [name], MD   [facility] Providers Cardiologist:  Freada Bergeron, MD {  Referring MD: [name], MD    History of Present Illness:    [name] is a 86 y.o. female with a hx of pAfib, chronic diastolic CHF, hypertension, HLD, thoracic aneurysm previously followed by Dr. Lucianne Lei Trigt-not felt to be a candidate for repair given severe kyphosis who was previously followed by Dr. Meda Coffee who now returns to clinic for follow-up.  Per review of the record, TTE 11/28/2016 with LVEF 55 to 60%, mild to moderate AI, moderate MR, severe atrial enlargement, severe TR.  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 Also with paroxysmal Afib s/p spontaneously conversion to normal sinus rhythm on diltiazem and flecainide.  Myoview 01/2017 poor quality study but low risk EF 72%.  Echo 07/2018 EF 55 to 60% with grade 1 DD mildly dilated ascending aorta with moderate MVP with MR and moderate to severe dilated LA moderate TR moderate pulmonary hypertension.  Seen in clinic 03/22/21 where she was feeling okay with mild dyspnea on exertion. TTE at that time showed moderate holosystolic prolapse with moderate-to-severe MR with possible P2 flail. Saw Dr. Burt Knack on 06/05/21 where she was deemed best for medical management given relative lack of symptoms and comorbidities. Repeat TTE showed stable moderate vs moderate-to-severe MR.   Was last seen in clinic on 02/16/22 where she was dealing with a UTI and LLE pain and swelling. LE doppler was negative for DVT. We recommended she follow-up with her PCP for her UTI. She was otherwise stable from a CV standpoint. We stopped flecainide at that time due to advanced age and comorbidities.  Today, she has been feeling fatigued and unwell. Her balance has worsened and she still  experiences tingling and pain in her legs. She has been taking oxycodone for her back pain which seems to help the legs some.   She continues to have LE edema that is overall fairly well controlled with lasix. May need one extra day of diuretic as she finds that she is swollen from not taking any over the weekend. She denies chest pain. Has constant dizziness which is not new. Has persistent SOB that again is chronic. No orthopnea, PND. Blood pressure is well controlled.   She has tried physical therapy for her loss of balance, but has not made improvement. Of note, also experiences vertigo. She is hoping to see someone to help with her back/leg pain.  The patient denies chest pain nocturnal dyspnea, orthopnea.  There have been no lightheadedness, or syncope.    Past Medical History:  Diagnosis Date   Anxiety    Aortic regurgitation    Breast cancer ([place]) 08/20/2011   R breast DCIS, ER/PR +   Cataract 3 and 10/92   bilateral   Chronic diastolic CHF (congestive heart failure) (HCC)    Compression fracture of fourth lumbar vertebra (HCC)    DVT (deep venous thrombosis) ([place]) 08/2011   LL extremity    Fibromyalgia    Fracture lumbar vertebra-closed (HCC)    Fracture of thoracic vertebra, closed (HCC)    GERD (gastroesophageal reflux disease)    H/O hiatal hernia    History of blood clots    History of radiation therapy 01/2012  R breast   Hypercholesteremia    Hypertension    DR Orinda Kenner   Hypothyroidism    Mitral regurgitation    Mitral valve prolapse    Osteoporosis    PAF (paroxysmal atrial fibrillation) ([place])    a. dx 11/2016.   Pulmonary hypertension ([place])    Rib fractures    Thoracic ascending aortic aneurysm ([place])    a. followed by Dr. Prescott Gum.   Tricuspid regurgitation     Past Surgical History:  Procedure Laterality Date   ANTERIOR CERVICAL DECOMP/DISCECTOMY FUSION N/A 09/09/2015   Procedure: Anterior Cervical Decompression and Fusion Cervical seven-Thorasic one  ;  Surgeon: Erline Levine, MD;  Location: [place] [facility]UR[facility]S;  Service: Neurosurgery;  Laterality: N/A;   [place]  10/16/2006   RIH - Dr Hassell Done   KYPHOPLASTY N/A 01/26/2013   Procedure: KYPHOPLASTY;  Surgeon: Kristeen Miss, MD;  Location: [place] NEURO ORS;  Service: Neurosurgery;  Laterality: N/A;  T11 and L1   KYPHOPLASTY N/A 06/21/2017   Procedure: Lumbar four Kyphoplasty;  Surgeon: Erline Levine, MD;  Location: [place];  Service: Neurosurgery;  Laterality: N/A;   MASTECTOMY, PARTIAL  10/17/2011   Procedure: MASTECTOMY PARTIAL;  Surgeon: Haywood Lasso, MD;  Location: [place];  Service: General;  Laterality: Right;  needle guided   TEE WITHOUT CARDIOVERSION N/A 12/04/2016   Procedure: TRANSESOPHAGEAL ECHOCARDIOGRAM (TEE);  Surgeon: Pixie Casino, MD;  Location: [facility];  Service: Cardiovascular;  Laterality: N/A;   TONSILLECTOMY  1944    Current Medications: Current Meds  Medication Sig   apixaban (ELIQUIS) 2.5 MG TABS tablet Take 1 tablet (2.5 mg total) by mouth 2 (two) times daily.   BONIVA 150 MG tablet Take 150 mg by mouth every 30 (thirty) days.   calcium citrate-vitamin D (CITRACAL+D) 315-200 MG-UNIT per tablet Take 1 tablet by mouth 2 (two) times daily.   Camphor-Eucalyptus-Menthol (VICKS VAPORUB EX) Apply 1 application topically daily as needed (toenail fungus).    cholecalciferol (VITAMIN D) 1000 UNITS tablet Take 1,000 Units by mouth daily with lunch. Vitamin D3   conjugated estrogens (PREMARIN) vaginal cream Place 1 Applicatorful vaginally 2 (two) times a week.   D-Mannose 500 MG CAPS Take by mouth daily.   diltiazem (CARDIZEM CD) 180 MG 24 hr capsule Take 1 capsule daily   estradiol (ESTRACE) 0.1 MG/GM vaginal cream Place 1 Applicatorful vaginally once a week.   HYDROcodone-acetaminophen (NORCO/VICODIN) 5-325 MG tablet Take 1-2 tablets by mouth every 4 (four) hours as needed (mild  pain).   iron polysaccharides (NIFEREX) 150 MG capsule Take 150 mg by mouth 3 (three) times a week.    levothyroxine (SYNTHROID, LEVOTHROID) 125 MCG tablet Take 125 mcg by mouth daily before breakfast.   losartan (COZAAR) 25 MG tablet Take 1 tablet (25 mg total) by mouth daily.   Multiple Vitamin (MULTIVITAMIN) tablet Take 1 tablet by mouth daily with breakfast.   omeprazole (PRILOSEC OTC) 20 MG tablet Take 20 mg by mouth daily before breakfast.    potassium chloride (KLOR-CON) 10 MEQ tablet Take 10 mEq by mouth 3 (three) times a week.   pravastatin (PRAVACHOL) 20 MG tablet Take 20 mg by mouth daily with supper.    risedronate (ACTONEL) 150 MG tablet Take 150 mg by mouth every 30 (thirty) days. with water on empty stomach, nothing by mouth or lie down for next  30 minutes.   solifenacin (VESICARE) 5 MG tablet Take 5 mg by mouth daily with breakfast.    [DISCONTINUED] furosemide (LASIX) 20 MG tablet Take 1 tablet (20 mg total) by mouth 3 (three) times a week. Take on Monday, Wed, and Fridays.     Allergies:   Contrast media [iodinated contrast media], Iohexol, Mucinex [guaifenesin er], Zanaflex [tizanidine hcl], Penicillins, and Robaxin [methocarbamol]   Social History   Socioeconomic History   Marital status: Married    Spouse name: Not on file   Number of children: Not on file   Years of education: Not on file   Highest education level: Not on file  Occupational History   Not on file  Tobacco Use   Smoking status: Never   Smokeless tobacco: Never  Vaping Use   Vaping Use: Never used  Substance and Sexual Activity   Alcohol use: No   Drug use: No   Sexual activity: Never  Other Topics Concern   Not on file  Social History Narrative   Not on file   Social Determinants of Health   Financial Resource Strain: Not on file  Food Insecurity: Not on file  Transportation Needs: Not on file  Physical Activity: Not on file  Stress: Not on file  Social Connections: Not on file      Family History: The patient's family history includes Heart disease in her maternal grandfather, maternal uncle, and mother.  ROS:   Please see the history of present illness. (+) Back Pain (+) LE Pain and tingling (+) Loss of Balance (+) LE Edema (+) Vertigo (+) Fatigue All other systems are reviewed and negative.    EKGs/Labs/Other Studies Reviewed:    The following studies were reviewed today: TTE 12/17/21: IMPRESSIONS   1. Left ventricular ejection fraction, by estimation, is 60 to 65%. The left ventricle has normal function. The left ventricle has no regional wall motion abnormalities. There is moderate hypertrophy of the basal septal segment. The rest of the LV  segments demonstrate mild asymmetric left ventricular hypertrophy of the basal-septal segment. Left ventricular diastolic parameters are consistent with Grade I diastolic dysfunction (impaired relaxation).   2. Right ventricular systolic function is normal. The right ventricular size is mildly enlarged. There is severely elevated pulmonary artery systolic pressure. The estimated right ventricular systolic pressure is  42.3 mmHg.   3. Left atrial size was severely dilated.   4. Right atrial size was mildly dilated.   5. The mitral valve is myxomatous. There is bileaflet prolapse that is more notable in the posterior mitral valve leaflet. There is moderate mitral regurgitation EROA 0.23 with Rvol 7m.   6. Tricuspid valve regurgitation is moderate to severe.   7. The aortic valve is tricuspid. There is mild calcification of the aortic valve. There is mild thickening of the aortic valve. Aortic valve regurgitation is mild to moderate.   8. Aortic dilatation noted. There is moderate-to-severe dilatation of the ascending aorta, measuring 49 mm. There is mild dilatation of the aortic root, measuring 40 mm.   Comparison(s): Compared to prior TTE in 04/2021, the mitral regurgitation  appears moderate on current study which is  slightly less than prior in the  setting of a lower systolic blood pressure. The ascending aortic size  remains stably dilated at 446m (previously 5075m   TTE 04/2021: IMPRESSIONS   1. Left ventricular ejection fraction, by estimation, is 60 to 65%. The left ventricle has normal function. The left ventricle has no regional wall  motion abnormalities. There is moderate asymmetric hypertrophy of the basal septum. The rest of the LV  segments demonstrate mild left ventricular hypertrophy. Left ventricular diastolic parameters are consistent with Grade I diastolic dysfunction (impaired relaxation).   2. Right ventricular systolic function is normal. The right ventricular size is normal. There is mildly elevated pulmonary artery systolic pressure. The estimated right ventricular systolic pressure is 95.1 mmHg.   3. Left atrial size was severely dilated.   4. The mitral valve is myxomatous. There is moderate holosystolic prolapse of of the mitral valve most notably of the posterior mitral valve leaflet with possible P2 flail. Using a PISA radius of 0.9, there is at least moderate-to-severe mitral  regurgitation with EROA 0.3cm2, RVol 17m. Recommend TEE for further evaluation.   5. Tricuspid valve regurgitation is moderate.   6. The aortic valve is tricuspid. There is mild calcification of the  aortic valve. There is mild thickening of the aortic valve. Aortic valve regurgitation is mild to moderate. Mild to moderate aortic valve sclerosis/calcification is present, without any evidence of aortic stenosis.   7. Aortic dilatation noted. There is mild dilatation of the aortic root, measuring 39 mm. There is moderate-to-severe dilatation of the ascending aorta, measuring 50 mm.   8. The inferior vena cava is normal in size with greater than 50% respiratory variability, suggesting right atrial pressure of 3 mmHg.   9. Right atrial size was mildly dilated.   Comparison(s): Compared to prior echo, the MR now  appears severe. The ascending aorta now measures 582m(previously 4147m  2D echo 08/06/2018 Study Conclusions   - Left ventricle: The cavity size was normal. There was severe    focal basal hypertrophy. Systolic function was normal. The    estimated ejection fraction was in the range of 55% to 60%.   Wall motion was normal; there were no regional wall motion    abnormalities. There was an increased relative contribution of    atrial contraction to ventricular filling. Doppler parameters are    consistent with abnormal left ventricular relaxation (grade 1    diastolic dysfunction). Doppler parameters are consistent with    high ventricular filling pressure.  - Aortic valve: Trileaflet; mildly thickened, mildly calcified    leaflets. There was mild regurgitation.  - Aorta: Ascending aorta diameter: 41 mm (ED).  - Ascending aorta: The ascending aorta was mildly dilated.  - Mitral valve: Mild diffuse thickening, consistent with myxomatous    proliferation. Moderate, holosystolic prolapse, involving the    posterior > anterior leaflet. There was moderate regurgitation.    Effective regurgitant orifice (PISA): 0.15 cm^2. Regurgitant    volume (PISA): 32 ml.  - Left atrium: The atrium was moderately to severely dilated.  - Tricuspid valve: There was moderate regurgitation.  - Pulmonary arteries: PA peak pressure: 45 mm Hg (S).   Impressions:   - The right ventricular systolic pressure was increased consistent    with moderate pulmonary hypertension.  Myoview 01/2017: The nuclear study is very poor quality due to body habitus Defect 1: There is a defect present in the basal inferolateral and mid inferolateral location that is most likely artifact due to uptake in structures below the diaphragm. This is a low risk study. Nuclear stress EF: 72%. The left ventricular ejection fraction is hyperdynamic (>65%).     EKG:  05/21/2022 EKG: NSR, iRBBB, LAFB, PAC HR 87 02/16/2022: not ordered  today 03/22/21: NSR, LAFB, anterior infarct (unchanged), first degree AVB, HR 65  Recent Labs:  11/23/2021: BUN 32; Creatinine, Ser 0.74; Magnesium 2.3; Potassium 4.3; Sodium 137  Recent Lipid Panel    Component Value Date/Time   CHOL  12/02/2010 0400    149        ATP III CLASSIFICATION:  <200     mg/dL   Desirable  200-239  mg/dL   Borderline High  >=240    mg/dL   High          TRIG 54 12/02/2010 0400   HDL 58 12/02/2010 0400   CHOLHDL 2.6 12/02/2010 0400   VLDL 11 12/02/2010 0400   LDLCALC  12/02/2010 0400    80        Total Cholesterol/HDL:CHD Risk Coronary Heart Disease Risk Table                     Men   Women  1/2 Average Risk   3.4   3.3  Average Risk       5.0   4.4  2 X Average Risk   9.6   7.1  3 X Average Risk  23.4   11.0        Use the calculated Patient Ratio above and the CHD Risk Table to determine the patient's CHD Risk.        ATP III CLASSIFICATION (LDL):  <100     mg/dL   Optimal  100-129  mg/dL   Near or Above                    Optimal  130-159  mg/dL   Borderline  160-189  mg/dL   High  >190     mg/dL   Very High     Risk Assessment/Calculations:    CHA2DS2-VASc Score = 6  This indicates a 9.7% annual risk of stroke. The patient's score is based upon: CHF History: 1 HTN History: 1 Diabetes History: 0 Stroke History: 0 Vascular Disease History: 1 Age Score: 2 Gender Score: 1     Physical Exam:    VS:  BP 130/90   Pulse 87   Ht '4\' 11"'$  (1.499 m)   Wt 98 lb 12.8 oz (44.8 kg)   SpO2 99%   BMI 19.96 kg/m     Wt Readings from Last 3 Encounters:  05/21/22 98 lb 12.8 oz (44.8 kg)  02/16/22 104 lb 3.2 oz (47.3 kg)  11/23/21 106 lb 9.6 oz (48.4 kg)     GEN: Elderly female, comfortable, Kyphotic. HEENT: Left eye strabismus NECK: No JVD; No carotid bruits CARDIAC: RRR, 3/6 systolic murmur heard throughout the precordium RESPIRATORY:  Clear to auscultation without rales, wheezing or rhonchi  ABDOMEN: Soft, non-tender,  non-distended MUSCULOSKELETAL:  no edema; warm. Has scoliosis SKIN: Warm and dry NEUROLOGIC:  Alert and oriented x 3 PSYCHIATRIC:  Normal affect   ASSESSMENT:    1. Chronic diastolic CHF (congestive heart failure) ([place])   2. Essential hypertension   3. PAF (paroxysmal atrial fibrillation) ([place])   4. Severe mitral regurgitation   5. Aneurysm of ascending aorta without rupture ([place])   6. Unsteady gait   7. Mitral valve insufficiency, unspecified etiology   8. Chronic bilateral thoracic back pain   9. Pain in both lower extremities      PLAN:    In order of problems listed above:  No problem-specific Assessment & Plan notes found for this encounter.    #Paroxysmal Afib: CHADs-vasc 6. On apixaban and dilt. Flecainide stopped due to advanced age. Currently  in NSR. -Stopped flecainide given valve disease and advanced age -Continue apixaban '5mg'$  BID -Continue diltiazem '180mg'$  daily -ECG in 11/2021 shows she remains in NSR   #Chronic diastolic CHF: TTE 93/81/82 with LVEF 60-65%, moderate vs moderate to severe MR due to bileaflet prolapse. Currently with mild LE edema on exam. -Increase lasix to '20mg'$  M, W, F, Sun -Continue potassium chloride '10mg'$ Eq M, W, F -Continue losartan '25mg'$  daily  #Thoracic aortic aneurysm: Previously followed by Dr. Prescott Gum now transitioned to Dr. Orvan Seen. Managed conservatively as deemed to be not a surgical candidate -Continue losartan '25mg'$  daily -Continue pravsatatin '20mg'$    #Valvular heart disease with moderate MVP and moderate-to-severe MR, moderate TR mild AI and moderate pulmonary hypertension:  TTE 11/17/21 with LVEF 60-65%, moderate vs moderate to severe MR due to bileaflet prolapse. Given relative lack of symptoms and medical comorbidities, managing medically at this time.  -Medical management -Continue lasix '20mg'$  M, W, F with K 36mq on those days -Continue losartan and dilt as above; BP well controlled -Check TTE for monitoring    #Hyperlipidemia: -Continue pravasatin '20mg'$  daily  #Unsteady Gait: #Possible lightheadedness: Patient reports that she feels off balance with episodes of lightheadedness but unclear if this is true lightheadedness or unsteady gait. This has been an ongoing issues. Has been followed by her PCP.  #Leg Pain: #Back Pain: Suspect related to her severe scoliosis. Will refer to Dr. STamala Julianfor further evaluation per patient request.   Follow-up in 6 months or PRN  Medication Adjustments/Labs and Tests Ordered: Current medicines are reviewed at length with the patient today.  Concerns regarding medicines are outlined above.  Orders Placed This Encounter  Procedures   AMB referral to sports medicine   EKG 12-Lead   ECHOCARDIOGRAM COMPLETE   Meds ordered this encounter  Medications   furosemide (LASIX) 20 MG tablet    Sig: Take one tablet by mouth on Monday, Wed, Fridays, and Sunday    Dispense:  48 tablet    Refill:  3    Dose decrease    Patient Instructions  Medication Instructions:  Your physician has recommended you make the following change in your medication:  1-TAKE Furosemide  mg by mouth Monday, Wednesday, Friday, and Sunday.  *If you need a refill on your cardiac medications before your next appointment, please call your pharmacy*  Lab Work: If you have labs (blood work) drawn today and your tests are completely normal, you will receive your results only by: [place](if you have MyChart) OR A paper copy in the mail If you have any lab test that is abnormal or we need to change your treatment, we will call you to review the results.  Testing/Procedures: Your physician has requested that you have an echocardiogram. Echocardiography is a painless test that uses sound waves to create images of your heart. It provides your doctor with information about the size and shape of your heart and how well your heart's chambers and valves are working. This procedure takes  approximately one hour. There are no restrictions for this procedure.  Follow-Up: At [facility] you and your health needs are our priority.  As part of our continuing mission to provide you with exceptional heart care, we have created designated Provider Care Teams.  These Care Teams include your primary Cardiologist (physician) and Advanced Practice Providers (APPs -  Physician Assistants and Nurse Practitioners) who all work together to provide you with the care you need, when you need it.  We recommend signing  up for the patient portal called "MyChart".  Sign up information is provided on this After Visit Summary.  MyChart is used to connect with patients for Virtual Visits (Telemedicine).  Patients are able to view lab/test results, encounter notes, upcoming appointments, etc.  Non-urgent messages can be sent to your provider as well.   To learn more about what you can do with MyChart, go to [email or web address].    Your next appointment:   6 month(s)  The format for your next appointment:   In Person  Provider:   Freada Bergeron, MD     Other Instructions You have been referred to Sports Medication- Dr. Tamala Julian   Important Information About Sugar         I,Tinashe Williams,acting as a scribe for Freada Bergeron, MD.,have documented all relevant documentation on the behalf of Freada Bergeron, MD,as directed by  Freada Bergeron, MD while in the presence of Freada Bergeron, MD.   I, Freada Bergeron, MD, have reviewed all documentation for this visit. The documentation on 05/21/22 for the exam, diagnosis, procedures, and orders are all accurate and complete.

## 2022-05-21 NOTE — Patient Instructions (Signed)
Medication Instructions:  Your physician has recommended you make the following change in your medication:  1-TAKE Furosemide  mg by mouth Monday, Wednesday, Friday, and Sunday.  *If you need a refill on your cardiac medications before your next appointment, please call your pharmacy*  Lab Work: If you have labs (blood work) drawn today and your tests are completely normal, you will receive your results only by: [place] (if you have MyChart) OR A paper copy in the mail If you have any lab test that is abnormal or we need to change your treatment, we will call you to review the results.  Testing/Procedures: Your physician has requested that you have an echocardiogram. Echocardiography is a painless test that uses sound waves to create images of your heart. It provides your doctor with information about the size and shape of your heart and how well your heart's chambers and valves are working. This procedure takes approximately one hour. There are no restrictions for this procedure.  Follow-Up: At [facility], you and your health needs are our priority.  As part of our continuing mission to provide you with exceptional heart care, we have created designated Provider Care Teams.  These Care Teams include your primary Cardiologist (physician) and Advanced Practice Providers (APPs -  Physician Assistants and Nurse Practitioners) who all work together to provide you with the care you need, when you need it.  We recommend signing up for the patient portal called "MyChart".  Sign up information is provided on this After Visit Summary.  MyChart is used to connect with patients for Virtual Visits (Telemedicine).  Patients are able to view lab/test results, encounter notes, upcoming appointments, etc.  Non-urgent messages can be sent to your provider as well.   To learn more about what you can do with MyChart, go to [email or web address].    Your next appointment:   6 month(s)  The format  for your next appointment:   In Person  Provider:   Freada Bergeron, MD     Other Instructions You have been referred to Sports Medication- Dr. Tamala Julian   Important Information About Sugar

## 2022-06-05 ENCOUNTER — Ambulatory Visit (HOSPITAL_COMMUNITY): Payer: Medicare Other | Attending: Internal Medicine

## 2022-06-05 DIAGNOSIS — I480 Paroxysmal atrial fibrillation: Secondary | ICD-10-CM | POA: Diagnosis not present

## 2022-06-05 DIAGNOSIS — I340 Nonrheumatic mitral (valve) insufficiency: Secondary | ICD-10-CM | POA: Diagnosis not present

## 2022-06-05 DIAGNOSIS — I7121 Aneurysm of the ascending aorta, without rupture: Secondary | ICD-10-CM | POA: Diagnosis not present

## 2022-06-05 DIAGNOSIS — I5032 Chronic diastolic (congestive) heart failure: Secondary | ICD-10-CM | POA: Diagnosis not present

## 2022-06-05 DIAGNOSIS — R2681 Unsteadiness on feet: Secondary | ICD-10-CM | POA: Insufficient documentation

## 2022-06-05 DIAGNOSIS — I10 Essential (primary) hypertension: Secondary | ICD-10-CM | POA: Diagnosis not present

## 2022-06-05 LAB — ECHOCARDIOGRAM COMPLETE
Area-P 1/2: 3.21 cm2
MV M vel: 5.68 m/s
MV Peak grad: 129 mmHg
P 1/2 time: 559 msec
Radius: 1.2 cm
S' Lateral: 2.9 cm

## 2022-06-07 DIAGNOSIS — N8111 Cystocele, midline: Secondary | ICD-10-CM | POA: Diagnosis not present

## 2022-06-07 DIAGNOSIS — N814 Uterovaginal prolapse, unspecified: Secondary | ICD-10-CM | POA: Diagnosis not present

## 2022-06-07 DIAGNOSIS — Z4689 Encounter for fitting and adjustment of other specified devices: Secondary | ICD-10-CM | POA: Diagnosis not present

## 2022-06-07 NOTE — Progress Notes (Unsigned)
[place] Phone: [phone] Subjective:   [name], am serving as a scribe for Dr. Hulan Saas.  I'm seeing this patient by the request  of:  [name], MD  CC: low back pain   [id number]  [name] is a 86 y.o. female coming in with complaint of LBP. Patient states that she has had pain for years. Lumbar flexion increases her pain which is achy in character. Denies any radiating symptoms. Uses heat and Tylenol for pain relief. History of multiple vertebral fractures with last one being 2015. Areas fractured: T 9, 11 and 12, L1 and L3. Also has had sacral ala insuffiencey ala in 2015. History of lumbar and thoracic back surgery March 2014 and August 2018. History of neck surgery in 2016.    Significant pmh as below    MRI of thoracic, showed progression noted of scoliosis in 2022.  ? Fracture T12/L1 at that time.  Hx of ACDF in cervical   ? Need for ABI, NCS, etc     Past Medical History:  Diagnosis Date   Anxiety    Aortic regurgitation    Breast cancer ([place]) 08/20/2011   R breast DCIS, ER/PR +   Cataract 3 and 10/92   bilateral   Chronic diastolic CHF (congestive heart failure) (HCC)    Compression fracture of fourth lumbar vertebra (HCC)    DVT (deep venous thrombosis) ([place]) 08/2011   LL extremity    Fibromyalgia    Fracture lumbar vertebra-closed (HCC)    Fracture of thoracic vertebra, closed ([place])    GERD (gastroesophageal reflux disease)    H/O hiatal hernia    History of blood clots    History of radiation therapy 01/2012   R breast   Hypercholesteremia    Hypertension    DR Orinda Kenner   Hypothyroidism    Mitral regurgitation    Mitral valve prolapse    Osteoporosis    PAF (paroxysmal atrial fibrillation) ([place])    a. dx 11/2016.   Pulmonary hypertension ([place])    Rib fractures    Thoracic ascending aortic aneurysm ([facility])    a. followed by Dr. Prescott Gum.    Tricuspid regurgitation    Past Surgical History:  Procedure Laterality Date   ANTERIOR CERVICAL DECOMP/DISCECTOMY FUSION N/A 09/09/2015   Procedure: Anterior Cervical Decompression and Fusion Cervical seven-Thorasic one ;  Surgeon: Erline Levine, MD;  Location: [place] [facility]S;  Service: Neurosurgery;  Laterality: N/A;   [place]  10/16/2006   RIH - Dr Hassell Done   KYPHOPLASTY N/A 01/26/2013   Procedure: KYPHOPLASTY;  Surgeon: Kristeen Miss, MD;  Location: [place] [facility]UR[facility]S;  Service: Neurosurgery;  Laterality: N/A;  T11 and L1   KYPHOPLASTY N/A 06/21/2017   Procedure: Lumbar four Kyphoplasty;  Surgeon: Erline Levine, MD;  Location: [place];  Service: Neurosurgery;  Laterality: N/A;   MASTECTOMY, PARTIAL  10/17/2011   Procedure: MASTECTOMY PARTIAL;  Surgeon: Haywood Lasso, MD;  Location: [place];  Service: General;  Laterality: Right;  needle guided   TEE WITHOUT CARDIOVERSION N/A 12/04/2016   Procedure: TRANSESOPHAGEAL ECHOCARDIOGRAM (TEE);  Surgeon: Pixie Casino, MD;  Location: [facility] ENDOSCOPY;  Service: Cardiovascular;  Laterality: N/A;   TONSILLECTOMY  1944   Social History   Socioeconomic History   Marital  status: Married    Spouse name: Not on file   Number of children: Not on file   Years of education: Not on file   Highest education level: Not on file  Occupational History   Not on file  Tobacco Use   Smoking status: Never   Smokeless tobacco: Never  Vaping Use   Vaping Use: Never used  Substance and Sexual Activity   Alcohol use: [name]   Drug use: [name]   Sexual activity: Never  Other Topics Concern   Not on file  Social History Narrative   Not on file   Social Determinants of Health   Financial Resource Strain: Not on file  Food Insecurity: Not on file  Transportation Needs: Not on file  Physical Activity: Not on file  Stress: Not on file  Social Connections: Not on file    Allergies  Allergen Reactions   Contrast Media [Iodinated Contrast Media] Shortness Of Breath   Iohexol Shortness Of Breath   Mucinex [Guaifenesin Er] Other (See Comments)    DIZZINESS   Zanaflex [Tizanidine Hcl] Anxiety and Other (See Comments)    Dizziness   Penicillins Rash    Has patient had a PCN reaction causing immediate rash, facial/tongue/throat swelling, SOB or lightheadedness with hypotension: [name] Has patient had a PCN reaction causing severe rash involving mucus membranes or skin necrosis: [name] Has patient had a PCN reaction that required hospitalization [name] Has patient had a PCN reaction occurring within the last 10 years: [name] If all of the above answers are "[name]", then may proceed with Cephalosporin use.    Robaxin [Methocarbamol] Diarrhea   Family History  Problem Relation Age of Onset   Heart disease Mother    Heart disease Maternal Uncle    Heart disease Maternal Grandfather     Current Outpatient Medications (Endocrine & Metabolic):    BONIVA 643 MG tablet, Take 150 mg by mouth every 30 (thirty) days.   levothyroxine (SYNTHROID, LEVOTHROID) 125 MCG tablet, Take 125 mcg by mouth daily before breakfast.   risedronate (ACTONEL) 150 MG tablet, Take 150 mg by mouth every 30 (thirty) days. with water on empty stomach, nothing by mouth or lie down for next 30 minutes.  Current Outpatient Medications (Cardiovascular):    diltiazem (CARDIZEM CD) 180 MG 24 hr capsule, Take 1 capsule daily   furosemide (LASIX) 20 MG tablet, Take one tablet by mouth on Monday, Wed, Fridays, and Sunday   losartan (COZAAR) 25 MG tablet, Take 1 tablet (25 mg total) by mouth daily.   pravastatin (PRAVACHOL) 20 MG tablet, Take 20 mg by mouth daily with supper.   Current Outpatient Medications (Respiratory):    Camphor-Eucalyptus-Menthol (VICKS VAPORUB EX), Apply 1 application topically daily as needed (toenail fungus).   Current Outpatient Medications (Analgesics):    HYDROcodone-acetaminophen  (NORCO/VICODIN) 5-325 MG tablet, Take 1-2 tablets by mouth every 4 (four) hours as needed (mild pain).  Current Outpatient Medications (Hematological):    apixaban (ELIQUIS) 2.5 MG TABS tablet, Take 1 tablet (2.5 mg total) by mouth 2 (two) times daily.   iron polysaccharides (NIFEREX) 150 MG capsule, Take 150 mg by mouth 3 (three) times a week.   Current Outpatient Medications (Other):    calcium citrate-vitamin D (CITRACAL+D) 315-200 MG-UNIT per tablet, Take 1 tablet by mouth 2 (two) times daily.   cholecalciferol (VITAMIN D) 1000 UNITS tablet, Take 1,000 Units by mouth daily with lunch. Vitamin D3   clotrimazole-betamethasone (LOTRISONE) cream, Apply topically 3 (three) times daily as needed.  conjugated estrogens (PREMARIN) vaginal cream, Place 1 Applicatorful vaginally 2 (two) times a week.   D-Mannose 500 MG CAPS, Take by mouth daily.   estradiol (ESTRACE) 0.1 MG/GM vaginal cream, Place 1 Applicatorful vaginally once a week.   Magnesium 200 MG TABS, Take 1 tablet (200 mg total) by mouth daily.   Meclizine HCl (BONINE PO), Take by mouth as needed.   Multiple Vitamin (MULTIVITAMIN) tablet, Take 1 tablet by mouth daily with breakfast.   omeprazole (PRILOSEC OTC) 20 MG tablet, Take 20 mg by mouth daily before breakfast.    potassium chloride (KLOR-CON M) 10 MEQ tablet, Take 1 tablet (10 mEq total) by mouth 3 (three) times a week. Take on Mon, Wed, and Fridays with lasix.   potassium chloride (KLOR-CON) 10 MEQ tablet, Take 10 mEq by mouth 3 (three) times a week.   solifenacin (VESICARE) 5 MG tablet, Take 5 mg by mouth daily with breakfast.    Reviewed prior external information including notes and imaging from  primary care provider As well as notes that were available from care everywhere and other healthcare systems.  Past medical history, social, surgical and family history all reviewed in electronic medical record.  [name] pertanent information unless stated regarding to the chief  complaint.   Review of Systems:  [name] headache, visual changes, nausea, vomiting, diarrhea, constipation, dizziness, abdominal pain, skin rash, fevers, chills, night sweats, weight loss, swollen lymph nodes, , chest pain, shortness of breath, mood changes. POSITIVE muscle aches, body aches, joint pain  Objective  Blood pressure 126/88, pulse (!) 54, height '4\' 11"'$  (1.499 m), weight 103 lb (46.7 kg), SpO2 97 %.   General: [name] apparent distress alert and oriented x3 mood and affect normal, dressed appropriately.  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  Patient has severe scoliosis noted with a left-sided sidebending and right-sided rotation of the thoracic and left-sided sidebending with rotation of the lumbar spine.  Diffusely tender to palpation in multiple different areas of the back.  Patient is sitting relatively comfortably but has difficulty getting up.  Walks with the aid of a walker.        Impression and Recommendations:    The above documentation has been reviewed and is accurate and complete Lyndal Pulley, DO

## 2022-06-11 ENCOUNTER — Ambulatory Visit (INDEPENDENT_AMBULATORY_CARE_PROVIDER_SITE_OTHER): Payer: Medicare Other | Admitting: Family Medicine

## 2022-06-11 ENCOUNTER — Encounter: Payer: Self-pay | Admitting: Family Medicine

## 2022-06-11 VITALS — BP 126/88 | HR 54 | Ht 59.0 in | Wt 103.0 lb

## 2022-06-11 DIAGNOSIS — E038 Other specified hypothyroidism: Secondary | ICD-10-CM

## 2022-06-11 DIAGNOSIS — S32040G Wedge compression fracture of fourth lumbar vertebra, subsequent encounter for fracture with delayed healing: Secondary | ICD-10-CM

## 2022-06-11 DIAGNOSIS — M2550 Pain in unspecified joint: Secondary | ICD-10-CM

## 2022-06-11 DIAGNOSIS — E559 Vitamin D deficiency, unspecified: Secondary | ICD-10-CM

## 2022-06-11 LAB — IBC PANEL
Iron: 105 ug/dL (ref 42–145)
Saturation Ratios: 33.8 % (ref 20.0–50.0)
TIBC: 310.8 ug/dL (ref 250.0–450.0)
Transferrin: 222 mg/dL (ref 212.0–360.0)

## 2022-06-11 LAB — CBC WITH DIFFERENTIAL/PLATELET
Basophils Absolute: 0.1 10*3/uL (ref 0.0–0.1)
Basophils Relative: 0.8 % (ref 0.0–3.0)
Eosinophils Absolute: 0.3 10*3/uL (ref 0.0–0.7)
Eosinophils Relative: 3.3 % (ref 0.0–5.0)
HCT: 39.9 % (ref 36.0–46.0)
Hemoglobin: 13.4 g/dL (ref 12.0–15.0)
Lymphocytes Relative: 16.1 % (ref 12.0–46.0)
Lymphs Abs: 1.4 10*3/uL (ref 0.7–4.0)
MCHC: 33.7 g/dL (ref 30.0–36.0)
MCV: 89.2 fl (ref 78.0–100.0)
Monocytes Absolute: 0.7 10*3/uL (ref 0.1–1.0)
Monocytes Relative: 8.1 % (ref 3.0–12.0)
Neutro Abs: 6.1 10*3/uL (ref 1.4–7.7)
Neutrophils Relative %: 71.7 % (ref 43.0–77.0)
Platelets: 167 10*3/uL (ref 150.0–400.0)
RBC: 4.48 Mil/uL (ref 3.87–5.11)
RDW: 13.1 % (ref 11.5–15.5)
WBC: 8.5 10*3/uL (ref 4.0–10.5)

## 2022-06-11 LAB — COMPREHENSIVE METABOLIC PANEL
ALT: 18 U/L (ref 0–35)
AST: 25 U/L (ref 0–37)
Albumin: 4.3 g/dL (ref 3.5–5.2)
Alkaline Phosphatase: 42 U/L (ref 39–117)
BUN: 26 mg/dL — ABNORMAL HIGH (ref 6–23)
CO2: 30 mEq/L (ref 19–32)
Calcium: 9.8 mg/dL (ref 8.4–10.5)
Chloride: 100 mEq/L (ref 96–112)
Creatinine, Ser: 0.71 mg/dL (ref 0.40–1.20)
GFR: 78.11 mL/min (ref >60.00)
Glucose, Bld: 105 mg/dL — ABNORMAL HIGH (ref 70–99)
Potassium: 3.9 mEq/L (ref 3.5–5.1)
Sodium: 138 mEq/L (ref 135–145)
Total Bilirubin: 0.5 mg/dL (ref 0.2–1.2)
Total Protein: 7 g/dL (ref 6.0–8.3)

## 2022-06-11 LAB — TSH: TSH: 0.34 u[IU]/mL — ABNORMAL LOW (ref 0.35–5.50)

## 2022-06-11 LAB — URIC ACID: Uric Acid, Serum: 3.1 mg/dL (ref 2.4–7.0)

## 2022-06-11 LAB — FERRITIN: Ferritin: 77.5 ng/mL (ref 10.0–291.0)

## 2022-06-11 LAB — VITAMIN B12: Vitamin B-12: 1150 pg/mL — ABNORMAL HIGH (ref 211–911)

## 2022-06-11 LAB — VITAMIN D 25 HYDROXY (VIT D DEFICIENCY, FRACTURES): VITD: 79.81 ng/mL (ref 30.00–100.00)

## 2022-06-11 NOTE — Patient Instructions (Signed)
Consider Cymbalta for pain  Consider Prolia for bone health  Consider physical therapy Tart Cherry Extract '1200mg'$  at night Labs today See me again in 6 weeks

## 2022-06-11 NOTE — Assessment & Plan Note (Addendum)
History of lumbar compression fractures.  Patient has severe scoliosis noted as well.  Some of it is degenerative in some area but it is idiopathic.  We discussed with patient about different treatment options.  I did discuss that I think balance and coordination would be one of the things that would improve quality of life better than anything else.  Patient declined at the moment.  Will consider it.  We discussed also different medications including Cymbalta and patient is concerned of potential side effects.  We also discussed with patient about patient's history of osteoporosis and multiple compression fractures and sacral insufficiency fracture that does make me concerned that patient does have severe osteoporosis.  We discussed potential change to Prolia.  We did discuss that this would be potentially lifelong.  Patient is concerned that if any side effects to the injection and might be a long time with the side effects.  We discussed with patient that I do think these will be 2 of her better options.  Concerned that gabapentin can make her less able to be as cognizant.  Could consider nighttime dosing if necessary.  Did discuss over-the-counter medications as well.  Follow-up with me to discuss further in 6 weeks otherwise.  Total time reviewing patient's previous imaging, previous notes and surgical notes  55 minutes  We will get laboratory work-up to further evaluate if if there is anything else that we could optimize to help her with decreasing any progression and helping with some quality of life.

## 2022-06-13 ENCOUNTER — Telehealth: Payer: Self-pay | Admitting: Cardiology

## 2022-06-13 NOTE — Telephone Encounter (Signed)
ECHOCARDIOGRAM COMPLETE: Result Notes   Nuala Alpha, LPN  01/17/9431  7:61 PM EDT     The patient has been notified of the result and verbalized understanding.  All questions (if any) were answered. Nuala Alpha, LPN 02/17/928 5:74 PM    Pt states her fluid levels are good and swelling is non-existent at the time.  Pressures are running well.  She states her diuretic regimen Dr. Johney Frame has her on, seems to be working good for her at this time.  She will keep [place] posted as needed, if symptoms worsen. Pt verbalized understanding and agrees with this plan.   Nuala Alpha, LPN  05/14/4036 09:64 AM EDT     Left message for the pt to call back for results.   Freada Bergeron, MD  06/12/2022  8:28 PM EDT     Her echo shows normal pumping function. There is severe leakiness of the mitral valve and severe leakiness of her tricuspid valve. She also has elevated blood pressures in her lung arteries. Her aorta remains dilated. We will continue to try to manage with medications at this time and try to keep the fluid off and her feeling as well as possible. I am afraid she will not do well with an invasive procedure so I think we try to get by with medications as much as possible.

## 2022-06-13 NOTE — Telephone Encounter (Signed)
Pt is requesting call back to discuss results of echo. Transferred to LPN, Ivy.

## 2022-06-16 LAB — PTH, INTACT AND CALCIUM
Calcium: 9.7 mg/dL (ref 8.6–10.4)
PTH: 16 pg/mL (ref 16–77)

## 2022-06-28 DIAGNOSIS — R2689 Other abnormalities of gait and mobility: Secondary | ICD-10-CM | POA: Diagnosis not present

## 2022-06-28 DIAGNOSIS — H903 Sensorineural hearing loss, bilateral: Secondary | ICD-10-CM | POA: Diagnosis not present

## 2022-06-28 DIAGNOSIS — H9202 Otalgia, left ear: Secondary | ICD-10-CM | POA: Diagnosis not present

## 2022-07-04 ENCOUNTER — Other Ambulatory Visit: Payer: Self-pay

## 2022-07-04 DIAGNOSIS — I480 Paroxysmal atrial fibrillation: Secondary | ICD-10-CM

## 2022-07-04 DIAGNOSIS — I5032 Chronic diastolic (congestive) heart failure: Secondary | ICD-10-CM

## 2022-07-04 DIAGNOSIS — Z0189 Encounter for other specified special examinations: Secondary | ICD-10-CM

## 2022-07-04 DIAGNOSIS — Z79899 Other long term (current) drug therapy: Secondary | ICD-10-CM

## 2022-07-04 NOTE — Telephone Encounter (Signed)
As indicated below from Dr. Jacolyn Reedy last OV note on the pt, she was to continue her current dose of KDUR 10 mEq po M,W,F.  KDUR stayed the same and lasix regimen was all that was increased at that time.   Pt aware of this.  She states she has mistakenly been taking her KDUR 4 times weekly with lasix dosing and only has 1 KDUR pill left.  She states her pharmacy needs authorization to refill early.  Pt aware she is suppose to be taking KDUR 10 mEq po 3 times weekly-take on M, W, Fri.  Pt aware I will need her to come in for a repeat BMET tomorrow, to ensure stability of her K level, and to safely authorize early refill of Barry.  Pt agreed to this plan and will come into the office for repeat bmet tomorrow 8/24.   She is aware I will call her pharmacy and endorse this information to them.  Pt verbalized understanding and agrees with this plan.  Spoke with pts Pharmacy and endorsed to them that pt will come in for repeat BMET on 8/24 and ok to refill KDUR early at 10 mEq po M, Wed, Fri only.  Advised them to reinforce instructions to the pt, for she has been taking this incorrectly at 4 times per week.  Pharmacist verbalized understanding and agrees with this plan.  Pharmacist will refill med and provide instructions to her.    Will send this information to Dr. Johney Frame to make her aware of this plan.         PLAN:     In order of problems listed above:   No problem-specific Assessment & Plan notes found for this encounter.     #Paroxysmal Afib: CHADs-vasc 6. On apixaban and dilt. Flecainide stopped due to advanced age. Currently in NSR. -Stopped flecainide given valve disease and advanced age -Continue apixaban '5mg'$  BID -Continue diltiazem '180mg'$  daily -ECG in 11/2021 shows she remains in NSR   #Chronic diastolic CHF: TTE 85/46/27 with LVEF 60-65%, moderate vs moderate to severe MR due to bileaflet prolapse. Currently with mild LE edema on exam. -Increase lasix to '20mg'$  M, W, F,  Sun -Continue potassium chloride '10mg'$ Eq M, W, F -Continue losartan '25mg'$  daily   #Thoracic aortic aneurysm: Previously followed by Dr. Prescott Gum now transitioned to Dr. Orvan Seen. Managed conservatively as deemed to be not a surgical candidate -Continue losartan '25mg'$  daily -Continue pravsatatin '20mg'$    #Valvular heart disease with moderate MVP and moderate-to-severe MR, moderate TR mild AI and moderate pulmonary hypertension:  TTE 11/17/21 with LVEF 60-65%, moderate vs moderate to severe MR due to bileaflet prolapse. Given relative lack of symptoms and medical comorbidities, managing medically at this time.  -Medical management -Continue lasix '20mg'$  M, W, F with K 36mq on those days -Continue losartan and dilt as above; BP well controlled -Check TTE for monitoring   #Hyperlipidemia: -Continue pravasatin '20mg'$  daily   #Unsteady Gait: #Possible lightheadedness: Patient reports that she feels off balance with episodes of lightheadedness but unclear if this is true lightheadedness or unsteady gait. This has been an ongoing issues. Has been followed by her PCP.   #Leg Pain: #Back Pain: Suspect related to her severe scoliosis. Will refer to Dr. STamala Julianfor further evaluation per patient request.     Follow-up in 6 months or PRN

## 2022-07-04 NOTE — Telephone Encounter (Signed)
Pt calling stating that Dr. Johney Frame changed her medication furosemide to 4 days a week, but did not change potassium to 4 days a week and pt is out of potassium. Pt would like a call back concerning this matter. Please address

## 2022-07-05 ENCOUNTER — Other Ambulatory Visit: Payer: Medicare Other

## 2022-07-05 DIAGNOSIS — Z79899 Other long term (current) drug therapy: Secondary | ICD-10-CM | POA: Diagnosis not present

## 2022-07-05 DIAGNOSIS — I480 Paroxysmal atrial fibrillation: Secondary | ICD-10-CM | POA: Diagnosis not present

## 2022-07-05 DIAGNOSIS — I5032 Chronic diastolic (congestive) heart failure: Secondary | ICD-10-CM | POA: Diagnosis not present

## 2022-07-05 DIAGNOSIS — Z0189 Encounter for other specified special examinations: Secondary | ICD-10-CM | POA: Diagnosis not present

## 2022-07-06 ENCOUNTER — Telehealth: Payer: Self-pay | Admitting: Cardiology

## 2022-07-06 LAB — BASIC METABOLIC PANEL
BUN/Creatinine Ratio: 34 — ABNORMAL HIGH (ref 12–28)
BUN: 24 mg/dL (ref 8–27)
CO2: 23 mmol/L (ref 20–29)
Calcium: 9.7 mg/dL (ref 8.7–10.3)
Chloride: 100 mmol/L (ref 96–106)
Creatinine, Ser: 0.71 mg/dL (ref 0.57–1.00)
Glucose: 91 mg/dL (ref 70–99)
Potassium: 4.4 mmol/L (ref 3.5–5.2)
Sodium: 140 mmol/L (ref 134–144)
eGFR: 84 mL/min/{1.73_m2} (ref >59)

## 2022-07-06 MED ORDER — POTASSIUM CHLORIDE CRYS ER 10 MEQ PO TBCR: 10.0000 meq | EXTENDED_RELEASE_TABLET | ORAL | 1 refills | Status: DC

## 2022-07-06 NOTE — Telephone Encounter (Signed)
Spoke with patient.  Per Dr. Johney Frame: Her labs look completely stable. No need to change her medications. We can refill her potassium with no issue.   Patient verbalized understanding. Potassium chloride 20mq 3 times weekly sent to preferred pharmacy.

## 2022-07-06 NOTE — Addendum Note (Signed)
Addended by: Molli Barrows on: 07/06/2022 01:00 PM   Modules accepted: Orders

## 2022-07-06 NOTE — Telephone Encounter (Signed)
Pt would like for nurse to callback regarding lab results and medication that was discussed depending on results. Please advise

## 2022-07-06 NOTE — Telephone Encounter (Signed)
Her labs look completely stable. No need to change her medications. We can refill her potassium with no issue.

## 2022-07-06 NOTE — Telephone Encounter (Signed)
Returned call to patient.  Patient states she is calling about her lab results. Informed patient that Dr. Johney Frame has not yet had a chance to review the lab results but when she does our office will call her back to let her know what Dr. Johney Frame says/recommends.  Patient states she took her last potassium tablet today and that her pharmacy delivers her medications Friday afternoons. She wanted to know when Dr. Johney Frame would review these to determine if she needs to make any medication changes.  Informed patient I will forward this note to Dr. Johney Frame but cannot guarantee when she will be able to review the results.  Patient verbalized understanding.

## 2022-07-17 NOTE — Progress Notes (Signed)
[name] [address] [place] Phone: [phone] Subjective:   IVilma [name], am serving as a scribe for Dr. Hulan Saas.  I'm seeing this patient by the request  of:  [name], MD  CC: Lower back pain follow-up  [id number]  06/11/2022 History of lumbar compression fractures.  Patient has severe scoliosis noted as well.  Some of it is degenerative in some area but it is idiopathic.  We discussed with patient about different treatment options.  I did discuss that I think balance and coordination would be one of the things that would improve quality of life better than anything else.  Patient declined at the moment.  Will consider it.  We discussed also different medications including Cymbalta and patient is concerned of potential side effects.  We also discussed with patient about patient's history of osteoporosis and multiple compression fractures and sacral insufficiency fracture that does make me concerned that patient does have severe osteoporosis.  We discussed potential change to Prolia.  We did discuss that this would be potentially lifelong.  Patient is concerned that if any side effects to the injection and might be a long time with the side effects.  We discussed with patient that I do think these will be 2 of her better options.  Concerned that gabapentin can make her less able to be as cognizant.  Could consider nighttime dosing if necessary.  Did discuss over-the-counter medications as well.  Follow-up with me to discuss further in 6 weeks otherwise.  Total time reviewing patient's previous imaging, previous notes and surgical notes  55 minutes   We will get laboratory work-up to further evaluate if if there is anything else that we could optimize to help her with decreasing any progression and helping with some quality of life.  Update 07/23/2022 [name] is a 86 y.o. female coming in with complaint of lumbar spine  pain. Patient states in the same amount of pain. Nothing has really changed since last visit. No new issues. Patient has not changed anything significantly.      Past Medical History:  Diagnosis Date   Anxiety    Aortic regurgitation    Breast cancer ([facility]) 08/20/2011   R breast DCIS, ER/PR +   Cataract 3 and 10/92   bilateral   Chronic diastolic CHF (congestive heart failure) (HCC)    Compression fracture of fourth lumbar vertebra ([facility])    DVT (deep venous thrombosis) ([facility]) 08/2011   LL extremity    Fibromyalgia    Fracture lumbar vertebra-closed (HCC)    Fracture of thoracic vertebra, closed (HCC)    GERD (gastroesophageal reflux disease)    H/O hiatal hernia    History of blood clots    History of radiation therapy 01/2012   R breast   Hypercholesteremia    Hypertension    DR Orinda Kenner   Hypothyroidism    Mitral regurgitation    Mitral valve prolapse    Osteoporosis    PAF (paroxysmal atrial fibrillation) ([facility])    a. dx 11/2016.   Pulmonary hypertension ([facility])    Rib fractures    Thoracic ascending aortic aneurysm ([facility])    a. followed by Dr. Prescott Gum.   Tricuspid regurgitation    Past Surgical History:  Procedure Laterality Date   ANTERIOR CERVICAL DECOMP/DISCECTOMY FUSION N/A 09/09/2015   Procedure: Anterior Cervical Decompression and Fusion Cervical seven-Thorasic one ;  Surgeon: Erline Levine, MD;  Location: [facility];  Service: Neurosurgery;  Laterality: N/A;   [place]  10/16/2006   RIH - Dr Hassell Done   KYPHOPLASTY N/A 01/26/2013   Procedure: KYPHOPLASTY;  Surgeon: Kristeen Miss, MD;  Location: [facility];  Service: Neurosurgery;  Laterality: N/A;  T11 and L1   KYPHOPLASTY N/A 06/21/2017   Procedure: Lumbar four Kyphoplasty;  Surgeon: Erline Levine, MD;  Location: [facility];  Service: Neurosurgery;  Laterality: N/A;   MASTECTOMY, PARTIAL  10/17/2011   Procedure:  MASTECTOMY PARTIAL;  Surgeon: Haywood Lasso, MD;  Location: [facility];  Service: General;  Laterality: Right;  needle guided   TEE WITHOUT CARDIOVERSION N/A 12/04/2016   Procedure: TRANSESOPHAGEAL ECHOCARDIOGRAM (TEE);  Surgeon: Pixie Casino, MD;  Location: [facility] ENDOSCOPY;  Service: Cardiovascular;  Laterality: N/A;   TONSILLECTOMY  1944   Social History   Socioeconomic History   Marital status: Married    Spouse name: Not on file   Number of children: Not on file   Years of education: Not on file   Highest education level: Not on file  Occupational History   Not on file  Tobacco Use   Smoking status: Never   Smokeless tobacco: Never  Vaping Use   Vaping Use: Never used  Substance and Sexual Activity   Alcohol use: No   Drug use: No   Sexual activity: Never  Other Topics Concern   Not on file  Social History Narrative   Not on file   Social Determinants of Health   Financial Resource Strain: Not on file  Food Insecurity: Not on file  Transportation Needs: Not on file  Physical Activity: Not on file  Stress: Not on file  Social Connections: Not on file   Allergies  Allergen Reactions   Contrast Media [Iodinated Contrast Media] Shortness Of Breath   Iohexol Shortness Of Breath   Mucinex [Guaifenesin Er] Other (See Comments)    DIZZINESS   Zanaflex [Tizanidine Hcl] Anxiety and Other (See Comments)    Dizziness   Penicillins Rash    Has patient had a PCN reaction causing immediate rash, facial/tongue/throat swelling, SOB or lightheadedness with hypotension: No Has patient had a PCN reaction causing severe rash involving mucus membranes or skin necrosis: No Has patient had a PCN reaction that required hospitalization No Has patient had a PCN reaction occurring within the last 10 years: No If all of the above answers are "NO", then may proceed with Cephalosporin use.    Robaxin [Methocarbamol] Diarrhea   Family History  Problem Relation Age of Onset   Heart disease  Mother    Heart disease Maternal Uncle    Heart disease Maternal Grandfather     Current Outpatient Medications (Endocrine & Metabolic):    BONIVA 381 MG tablet, Take 150 mg by mouth every 30 (thirty) days.   levothyroxine (SYNTHROID, LEVOTHROID) 125 MCG tablet, Take 125 mcg by mouth daily before breakfast.   risedronate (ACTONEL) 150 MG tablet, Take 150 mg by mouth every 30 (thirty) days. with water on empty stomach, nothing by mouth or lie down for next 30 minutes.  Current Outpatient Medications (Cardiovascular):    diltiazem (CARDIZEM CD) 180 MG 24 hr capsule, Take 1 capsule daily   furosemide (LASIX) 20 MG tablet, Take one tablet by mouth on Monday, Wed, Fridays, and Sunday   losartan (COZAAR) 25 MG tablet, Take 1  tablet (25 mg total) by mouth daily.   pravastatin (PRAVACHOL) 20 MG tablet, Take 20 mg by mouth daily with supper.   Current Outpatient Medications (Respiratory):    Camphor-Eucalyptus-Menthol (VICKS VAPORUB EX), Apply 1 application topically daily as needed (toenail fungus).   Current Outpatient Medications (Analgesics):    HYDROcodone-acetaminophen (NORCO/VICODIN) 5-325 MG tablet, Take 1-2 tablets by mouth every 4 (four) hours as needed (mild pain).  Current Outpatient Medications (Hematological):    apixaban (ELIQUIS) 2.5 MG TABS tablet, Take 1 tablet (2.5 mg total) by mouth 2 (two) times daily.   iron polysaccharides (NIFEREX) 150 MG capsule, Take 150 mg by mouth 3 (three) times a week.   Current Outpatient Medications (Other):    calcium citrate-vitamin D (CITRACAL+D) 315-200 MG-UNIT per tablet, Take 1 tablet by mouth 2 (two) times daily.   cholecalciferol (VITAMIN D) 1000 UNITS tablet, Take 1,000 Units by mouth daily with lunch. Vitamin D3   clotrimazole-betamethasone (LOTRISONE) cream, Apply topically 3 (three) times daily as needed.   conjugated estrogens (PREMARIN) vaginal cream, Place 1 Applicatorful vaginally 2 (two) times a week.   D-Mannose 500 MG CAPS,  Take by mouth daily.   estradiol (ESTRACE) 0.1 MG/GM vaginal cream, Place 1 Applicatorful vaginally once a week.   Magnesium 200 MG TABS, Take 1 tablet (200 mg total) by mouth daily.   Meclizine HCl (BONINE PO), Take by mouth as needed.   Multiple Vitamin (MULTIVITAMIN) tablet, Take 1 tablet by mouth daily with breakfast.   omeprazole (PRILOSEC OTC) 20 MG tablet, Take 20 mg by mouth daily before breakfast.    potassium chloride (KLOR-CON M) 10 MEQ tablet, Take 1 tablet (10 mEq total) by mouth 3 (three) times a week. Take on Mon, Wed, and Fridays with lasix.   potassium chloride (KLOR-CON) 10 MEQ tablet, Take 10 mEq by mouth 3 (three) times a week.   solifenacin (VESICARE) 5 MG tablet, Take 5 mg by mouth daily with breakfast.    Reviewed prior external information including notes and imaging from  primary care provider As well as notes that were available from care everywhere and other healthcare systems.  Past medical history, social, surgical and family history all reviewed in electronic medical record.  No pertanent information unless stated regarding to the chief complaint.   Review of Systems:  No headache, visual changes, nausea, vomiting, diarrhea, constipation, dizziness, abdominal pain, skin rash, fevers, chills, night sweats, weight loss, swollen lymph nodes joint swelling, chest pain, shortness of breath, mood changes. POSITIVE muscle aches, body aches  Objective  Blood pressure 130/80, pulse 79, height '4\' 11"'$  (1.499 m), weight 101 lb (45.8 kg), SpO2 96 %.   General: No apparent distress alert and oriented x3 mood and affect normal, dressed appropriately.  HEENT: Pupils equal, extraocular movements intact  Significant arthritic changes noted.  Patient does have scoliosis noted.  Degenerative arthritic changes noted.  Ambulates with the aid of a walker.    Impression and Recommendations:    The above documentation has been reviewed and is accurate and complete Lyndal Pulley,  DO

## 2022-07-23 ENCOUNTER — Encounter: Payer: Self-pay | Admitting: Family Medicine

## 2022-07-23 ENCOUNTER — Ambulatory Visit (INDEPENDENT_AMBULATORY_CARE_PROVIDER_SITE_OTHER): Payer: Medicare Other | Admitting: Family Medicine

## 2022-07-23 DIAGNOSIS — M545 Low back pain, unspecified: Secondary | ICD-10-CM | POA: Diagnosis not present

## 2022-07-23 NOTE — Patient Instructions (Addendum)
Still consider Cymbalta and possible Prolia I think PT especially aquatic PT is a possibility CoQ10 214mg daily Continue tart cherry We can always consider sleep study

## 2022-07-23 NOTE — Assessment & Plan Note (Signed)
Discussed again at great length with patient.  Continues to have body aches and muscle aches.  We discussed with patient about different treatment options.  Patient still declines anything such as Cymbalta, Prolia for the osteoporosis, for physical therapy.  Patient does not want to undergo any more epidurals which will be challenging with patient also on Eliquis now.  Patient is on hydrocodone.  We discussed with her though that I feel that some of his other medications could be more beneficial.  Patient does not want to make any changes.  At this point patient can follow-up again in 3 months or as needed.  Total time discussing with patient 32 minutes

## 2022-07-25 ENCOUNTER — Telehealth: Payer: Self-pay | Admitting: Cardiology

## 2022-07-25 NOTE — Telephone Encounter (Signed)
Pt c/o swelling: STAT is pt has developed SOB within 24 hours  If swelling, where is the swelling located?   NO  How much weight have you gained and in what time span? No  Have you gained 3 pounds in a day or 5 pounds in a week? No  Do you have a log of your daily weights (if so, list)? No  Are you currently taking a fluid pill? Lasik  Are you currently SOB? No  Have you traveled recently? No   Patient stated her legs are feeling heavy and weak and she has been having issues with balance and lightheadedness.  Patient would like to know if these symptoms are related to her heart.

## 2022-07-25 NOTE — Telephone Encounter (Signed)
Spoke with pt for lengthy amount of time re symptoms Offered appt with Ambrose Pancoast NP for tom at 2:45 Pt refused does not have any way to get here has sitter in am only Pt seen ortho on 05/22/22 and was offered a tx plan declined meds and therapy Per pt may call PCP for appt tom pt feels may need to see neuro . Pt declines any SOB or swelling notes fatigue as well.Pt does not want to see APP only Dr Johney Frame Pt will see Dr Johney Frame 08/21/22 at 9:30 am  Will send to Dr Johney Frame to review .Adonis Housekeeper

## 2022-07-26 DIAGNOSIS — M545 Low back pain, unspecified: Secondary | ICD-10-CM | POA: Diagnosis not present

## 2022-07-26 DIAGNOSIS — R269 Unspecified abnormalities of gait and mobility: Secondary | ICD-10-CM | POA: Diagnosis not present

## 2022-07-26 DIAGNOSIS — R29898 Other symptoms and signs involving the musculoskeletal system: Secondary | ICD-10-CM | POA: Diagnosis not present

## 2022-07-26 DIAGNOSIS — R5382 Chronic fatigue, unspecified: Secondary | ICD-10-CM | POA: Diagnosis not present

## 2022-07-31 DIAGNOSIS — I080 Rheumatic disorders of both mitral and aortic valves: Secondary | ICD-10-CM | POA: Diagnosis not present

## 2022-07-31 DIAGNOSIS — I7121 Aneurysm of the ascending aorta, without rupture: Secondary | ICD-10-CM | POA: Diagnosis not present

## 2022-07-31 DIAGNOSIS — Z86718 Personal history of other venous thrombosis and embolism: Secondary | ICD-10-CM | POA: Diagnosis not present

## 2022-07-31 DIAGNOSIS — I509 Heart failure, unspecified: Secondary | ICD-10-CM | POA: Diagnosis not present

## 2022-07-31 DIAGNOSIS — M797 Fibromyalgia: Secondary | ICD-10-CM | POA: Diagnosis not present

## 2022-07-31 DIAGNOSIS — M7072 Other bursitis of hip, left hip: Secondary | ICD-10-CM | POA: Diagnosis not present

## 2022-07-31 DIAGNOSIS — I252 Old myocardial infarction: Secondary | ICD-10-CM | POA: Diagnosis not present

## 2022-07-31 DIAGNOSIS — Z8571 Personal history of Hodgkin lymphoma: Secondary | ICD-10-CM | POA: Diagnosis not present

## 2022-07-31 DIAGNOSIS — E039 Hypothyroidism, unspecified: Secondary | ICD-10-CM | POA: Diagnosis not present

## 2022-07-31 DIAGNOSIS — I872 Venous insufficiency (chronic) (peripheral): Secondary | ICD-10-CM | POA: Diagnosis not present

## 2022-07-31 DIAGNOSIS — M810 Age-related osteoporosis without current pathological fracture: Secondary | ICD-10-CM | POA: Diagnosis not present

## 2022-07-31 DIAGNOSIS — H53003 Unspecified amblyopia, bilateral: Secondary | ICD-10-CM | POA: Diagnosis not present

## 2022-07-31 DIAGNOSIS — Z9181 History of falling: Secondary | ICD-10-CM | POA: Diagnosis not present

## 2022-07-31 DIAGNOSIS — I480 Paroxysmal atrial fibrillation: Secondary | ICD-10-CM | POA: Diagnosis not present

## 2022-07-31 DIAGNOSIS — E7800 Pure hypercholesterolemia, unspecified: Secondary | ICD-10-CM | POA: Diagnosis not present

## 2022-07-31 DIAGNOSIS — N3281 Overactive bladder: Secondary | ICD-10-CM | POA: Diagnosis not present

## 2022-07-31 DIAGNOSIS — M353 Polymyalgia rheumatica: Secondary | ICD-10-CM | POA: Diagnosis not present

## 2022-07-31 DIAGNOSIS — I110 Hypertensive heart disease with heart failure: Secondary | ICD-10-CM | POA: Diagnosis not present

## 2022-07-31 DIAGNOSIS — Z853 Personal history of malignant neoplasm of breast: Secondary | ICD-10-CM | POA: Diagnosis not present

## 2022-07-31 DIAGNOSIS — M545 Low back pain, unspecified: Secondary | ICD-10-CM | POA: Diagnosis not present

## 2022-07-31 DIAGNOSIS — R519 Headache, unspecified: Secondary | ICD-10-CM | POA: Diagnosis not present

## 2022-07-31 DIAGNOSIS — K219 Gastro-esophageal reflux disease without esophagitis: Secondary | ICD-10-CM | POA: Diagnosis not present

## 2022-07-31 DIAGNOSIS — Z7901 Long term (current) use of anticoagulants: Secondary | ICD-10-CM | POA: Diagnosis not present

## 2022-07-31 DIAGNOSIS — R5382 Chronic fatigue, unspecified: Secondary | ICD-10-CM | POA: Diagnosis not present

## 2022-07-31 DIAGNOSIS — M1990 Unspecified osteoarthritis, unspecified site: Secondary | ICD-10-CM | POA: Diagnosis not present

## 2022-08-07 DIAGNOSIS — I480 Paroxysmal atrial fibrillation: Secondary | ICD-10-CM | POA: Diagnosis not present

## 2022-08-07 DIAGNOSIS — I509 Heart failure, unspecified: Secondary | ICD-10-CM | POA: Diagnosis not present

## 2022-08-07 DIAGNOSIS — I252 Old myocardial infarction: Secondary | ICD-10-CM | POA: Diagnosis not present

## 2022-08-07 DIAGNOSIS — I080 Rheumatic disorders of both mitral and aortic valves: Secondary | ICD-10-CM | POA: Diagnosis not present

## 2022-08-07 DIAGNOSIS — I110 Hypertensive heart disease with heart failure: Secondary | ICD-10-CM | POA: Diagnosis not present

## 2022-08-07 DIAGNOSIS — E7800 Pure hypercholesterolemia, unspecified: Secondary | ICD-10-CM | POA: Diagnosis not present

## 2022-08-14 DIAGNOSIS — I252 Old myocardial infarction: Secondary | ICD-10-CM | POA: Diagnosis not present

## 2022-08-14 DIAGNOSIS — I080 Rheumatic disorders of both mitral and aortic valves: Secondary | ICD-10-CM | POA: Diagnosis not present

## 2022-08-14 DIAGNOSIS — E7800 Pure hypercholesterolemia, unspecified: Secondary | ICD-10-CM | POA: Diagnosis not present

## 2022-08-14 DIAGNOSIS — I110 Hypertensive heart disease with heart failure: Secondary | ICD-10-CM | POA: Diagnosis not present

## 2022-08-14 DIAGNOSIS — I480 Paroxysmal atrial fibrillation: Secondary | ICD-10-CM | POA: Diagnosis not present

## 2022-08-14 DIAGNOSIS — I509 Heart failure, unspecified: Secondary | ICD-10-CM | POA: Diagnosis not present

## 2022-08-17 NOTE — Progress Notes (Unsigned)
Cardiology Office Note:    Date:  08/17/2022   ID:  [name], DOB 07/18/1936, MRN [id number]  PCP:  [name], MD   [facility] Providers Cardiologist:  Freada Bergeron, MD {  Referring MD: [name], MD    History of Present Illness:    [name] is a 86 y.o. female with a hx of pAfib, chronic diastolic CHF, hypertension, HLD, thoracic aneurysm previously followed by Dr. Lucianne Lei Trigt-not felt to be a candidate for repair given severe kyphosis who was previously followed by Dr. Meda Coffee who now returns to clinic for follow-up.  Per review of the record, TTE 11/28/2016 with LVEF 55 to 60%, mild to moderate AI, moderate MR, severe atrial enlargement, severe TR.  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 Also with paroxysmal Afib s/p spontaneously conversion to normal sinus rhythm on diltiazem and flecainide.  Myoview 01/2017 poor quality study but low risk EF 72%.  Echo 07/2018 EF 55 to 60% with grade 1 DD mildly dilated ascending aorta with moderate MVP with MR and moderate to severe dilated LA moderate TR moderate pulmonary hypertension.  Seen in clinic 03/22/21 where she was feeling okay with mild dyspnea on exertion. TTE at that time showed moderate holosystolic prolapse with moderate-to-severe MR with possible P2 flail. Saw Dr. Burt Knack on 06/05/21 where she was deemed best for medical management given relative lack of symptoms and comorbidities. Repeat TTE showed stable moderate vs moderate-to-severe MR.   Was last seen in clinic on 02/16/22 where she was dealing with a UTI and LLE pain and swelling. LE doppler was negative for DVT. We recommended she follow-up with her PCP for her UTI. She was otherwise stable from a CV standpoint. We stopped flecainide at that time due to advanced age and comorbidities.  Was last seen on 05/21/22 where she continued to have significant neuropathic and arthritic  pain. We increased her lasix to 4x/week instead of 3x/week for LE edema.She was otherwise stable from a CV standpoint.  Called clinic on 07/25/22 for continued balance issues and episodes of lightheadedness. She declined visit with NP that day. She was going to follow-up with PCP as well.   Today, ***  Past Medical History:  Diagnosis Date   Anxiety    Aortic regurgitation    Breast cancer ([place]) 08/20/2011   R breast DCIS, ER/PR +   Cataract 3 and 10/92   bilateral   Chronic diastolic CHF (congestive heart failure) (HCC)    Compression fracture of fourth lumbar vertebra (HCC)    DVT (deep venous thrombosis) ([place]) 08/2011   LL extremity    Fibromyalgia    Fracture lumbar vertebra-closed ([place])    Fracture of thoracic vertebra, closed ([place])    GERD (gastroesophageal reflux disease)    H/O hiatal hernia    History of blood clots    History of radiation therapy 01/2012   R breast   Hypercholesteremia    Hypertension    DR Orinda Kenner   Hypothyroidism    Mitral regurgitation    Mitral valve prolapse    Osteoporosis    PAF (paroxysmal atrial fibrillation) ([place])    a. dx 11/2016.   Pulmonary hypertension ([place])    Rib fractures    Thoracic ascending aortic aneurysm ([place])    a. followed by Dr. Prescott Gum.   Tricuspid regurgitation     Past Surgical History:  Procedure  Laterality Date   ANTERIOR CERVICAL DECOMP/DISCECTOMY FUSION N/A 09/09/2015   Procedure: Anterior Cervical Decompression and Fusion Cervical seven-Thorasic one ;  Surgeon: Erline Levine, MD;  Location: [place] NEURO ORS;  Service: Neurosurgery;  Laterality: N/A;   [place]  10/16/2006   RIH - Dr Hassell Done   KYPHOPLASTY N/A 01/26/2013   Procedure: KYPHOPLASTY;  Surgeon: Kristeen Miss, MD;  Location: [place] NEURO ORS;  Service: Neurosurgery;  Laterality: N/A;  T11 and L1   KYPHOPLASTY N/A 06/21/2017   Procedure: Lumbar four  Kyphoplasty;  Surgeon: Erline Levine, MD;  Location: [place];  Service: Neurosurgery;  Laterality: N/A;   MASTECTOMY, PARTIAL  10/17/2011   Procedure: MASTECTOMY PARTIAL;  Surgeon: Haywood Lasso, MD;  Location: [place];  Service: General;  Laterality: Right;  needle guided   TEE WITHOUT CARDIOVERSION N/A 12/04/2016   Procedure: TRANSESOPHAGEAL ECHOCARDIOGRAM (TEE);  Surgeon: Pixie Casino, MD;  Location: [facility] ENDOSCOPY;  Service: Cardiovascular;  Laterality: N/A;   TONSILLECTOMY  1944    Current Medications: No outpatient medications have been marked as taking for the 08/21/22 encounter (Appointment) with Freada Bergeron, MD.     Allergies:   Contrast media [iodinated contrast media], Iohexol, Mucinex [guaifenesin er], Zanaflex [tizanidine hcl], Penicillins, and Robaxin [methocarbamol]   Social History   Socioeconomic History   Marital status: Married    Spouse name: Not on file   Number of children: Not on file   Years of education: Not on file   Highest education level: Not on file  Occupational History   Not on file  Tobacco Use   Smoking status: Never   Smokeless tobacco: Never  Vaping Use   Vaping Use: Never used  Substance and Sexual Activity   Alcohol use: No   Drug use: No   Sexual activity: Never  Other Topics Concern   Not on file  Social History Narrative   Not on file   Social Determinants of Health   Financial Resource Strain: Not on file  Food Insecurity: Not on file  Transportation Needs: Not on file  Physical Activity: Not on file  Stress: Not on file  Social Connections: Not on file     Family History: The patient's family history includes Heart disease in her maternal grandfather, maternal uncle, and mother.  ROS:   Please see the history of present illness. (+) Back Pain (+) LE Pain and tingling (+) Loss of Balance (+) LE Edema (+) Vertigo (+) Fatigue All other systems are reviewed and negative.    EKGs/Labs/Other Studies Reviewed:     The following studies were reviewed today: TTE 12/16/2021: IMPRESSIONS   1. Left ventricular ejection fraction, by estimation, is 60 to 65%. The left ventricle has normal function. The left ventricle has no regional wall motion abnormalities. There is moderate hypertrophy of the basal septal segment. The rest of the LV  segments demonstrate mild asymmetric left ventricular hypertrophy of the basal-septal segment. Left ventricular diastolic parameters are consistent with Grade I diastolic dysfunction (impaired relaxation).   2. Right ventricular systolic function is normal. The right ventricular size is mildly enlarged. There is severely elevated pulmonary artery systolic pressure. The estimated right ventricular systolic pressure is  56.8 mmHg.   3. Left atrial size was severely dilated.   4. Right atrial size was mildly dilated.   5. The mitral valve is myxomatous.  There is bileaflet prolapse that is more notable in the posterior mitral valve leaflet. There is moderate mitral regurgitation EROA 0.23 with Rvol 16m.   6. Tricuspid valve regurgitation is moderate to severe.   7. The aortic valve is tricuspid. There is mild calcification of the aortic valve. There is mild thickening of the aortic valve. Aortic valve regurgitation is mild to moderate.   8. Aortic dilatation noted. There is moderate-to-severe dilatation of the ascending aorta, measuring 49 mm. There is mild dilatation of the aortic root, measuring 40 mm.   Comparison(s): Compared to prior TTE in 04/2021, the mitral regurgitation  appears moderate on current study which is slightly less than prior in the  setting of a lower systolic blood pressure. The ascending aortic size  remains stably dilated at 480m (previously 5064m   TTE 04/2021: IMPRESSIONS   1. Left ventricular ejection fraction, by estimation, is 60 to 65%. The left ventricle has normal function. The left ventricle has no regional wall motion abnormalities. There is  moderate asymmetric hypertrophy of the basal septum. The rest of the LV  segments demonstrate mild left ventricular hypertrophy. Left ventricular diastolic parameters are consistent with Grade I diastolic dysfunction (impaired relaxation).   2. Right ventricular systolic function is normal. The right ventricular size is normal. There is mildly elevated pulmonary artery systolic pressure. The estimated right ventricular systolic pressure is 45.18.8Hg.   3. Left atrial size was severely dilated.   4. The mitral valve is myxomatous. There is moderate holosystolic prolapse of of the mitral valve most notably of the posterior mitral valve leaflet with possible P2 flail. Using a PISA radius of 0.9, there is at least moderate-to-severe mitral  regurgitation with EROA 0.3cm2, RVol 81m51mecommend TEE for further evaluation.   5. Tricuspid valve regurgitation is moderate.   6. The aortic valve is tricuspid. There is mild calcification of the  aortic valve. There is mild thickening of the aortic valve. Aortic valve regurgitation is mild to moderate. Mild to moderate aortic valve sclerosis/calcification is present, without any evidence of aortic stenosis.   7. Aortic dilatation noted. There is mild dilatation of the aortic root, measuring 39 mm. There is moderate-to-severe dilatation of the ascending aorta, measuring 50 mm.   8. The inferior vena cava is normal in size with greater than 50% respiratory variability, suggesting right atrial pressure of 3 mmHg.   9. Right atrial size was mildly dilated.   Comparison(s): Compared to prior echo, the MR now appears severe. The ascending aorta now measures 50mm27meviously 41mm)61mD echo 08/06/2018 Study Conclusions   - Left ventricle: The cavity size was normal. There was severe    focal basal hypertrophy. Systolic function was normal. The    estimated ejection fraction was in the range of 55% to 60%.   Wall motion was normal; there were no regional wall motion     abnormalities. There was an increased relative contribution of    atrial contraction to ventricular filling. Doppler parameters are    consistent with abnormal left ventricular relaxation (grade 1    diastolic dysfunction). Doppler parameters are consistent with    high ventricular filling pressure.  - Aortic valve: Trileaflet; mildly thickened, mildly calcified    leaflets. There was mild regurgitation.  - Aorta: Ascending aorta diameter: 41 mm (ED).  - Ascending aorta: The ascending aorta was mildly dilated.  - Mitral valve: Mild diffuse thickening, consistent with myxomatous    proliferation. Moderate, holosystolic prolapse, involving the  posterior > anterior leaflet. There was moderate regurgitation.    Effective regurgitant orifice (PISA): 0.15 cm^2. Regurgitant    volume (PISA): 32 ml.  - Left atrium: The atrium was moderately to severely dilated.  - Tricuspid valve: There was moderate regurgitation.  - Pulmonary arteries: PA peak pressure: 45 mm Hg (S).   Impressions:   - The right ventricular systolic pressure was increased consistent    with moderate pulmonary hypertension.  Myoview 01/2017: The nuclear study is very poor quality due to body habitus Defect 1: There is a defect present in the basal inferolateral and mid inferolateral location that is most likely artifact due to uptake in structures below the diaphragm. This is a low risk study. Nuclear stress EF: 72%. The left ventricular ejection fraction is hyperdynamic (>65%).     EKG:  05/21/2022 EKG: NSR, iRBBB, LAFB, PAC HR 87 02/16/2022: not ordered today 03/22/21: NSR, LAFB, anterior infarct (unchanged), first degree AVB, HR 65  Recent Labs: 11/23/2021: Magnesium 2.3 06/11/2022: ALT 18; Hemoglobin 13.4; Platelets 167.0; TSH 0.34 07/05/2022: BUN 24; Creatinine, Ser 0.71; Potassium 4.4; Sodium 140  Recent Lipid Panel    Component Value Date/Time   CHOL  12/02/2010 0400    149        ATP III CLASSIFICATION:   <200     mg/dL   Desirable  200-239  mg/dL   Borderline High  >=240    mg/dL   High          TRIG 54 12/02/2010 0400   HDL 58 12/02/2010 0400   CHOLHDL 2.6 12/02/2010 0400   VLDL 11 12/02/2010 0400   LDLCALC  12/02/2010 0400    80        Total Cholesterol/HDL:CHD Risk Coronary Heart Disease Risk Table                     Men   Women  1/2 Average Risk   3.4   3.3  Average Risk       5.0   4.4  2 X Average Risk   9.6   7.1  3 X Average Risk  23.4   11.0        Use the calculated Patient Ratio above and the CHD Risk Table to determine the patient's CHD Risk.        ATP III CLASSIFICATION (LDL):  <100     mg/dL   Optimal  100-129  mg/dL   Near or Above                    Optimal  130-159  mg/dL   Borderline  160-189  mg/dL   High  >190     mg/dL   Very High     Risk Assessment/Calculations:    CHA2DS2-VASc Score = 6  This indicates a 9.7% annual risk of stroke. The patient's score is based upon: CHF History: 1 HTN History: 1 Diabetes History: 0 Stroke History: 0 Vascular Disease History: 1 Age Score: 2 Gender Score: 1     Physical Exam:    VS:  There were no vitals taken for this visit.    Wt Readings from Last 3 Encounters:  07/23/22 101 lb (45.8 kg)  06/11/22 103 lb (46.7 kg)  05/21/22 98 lb 12.8 oz (44.8 kg)     GEN: Elderly female, comfortable, Kyphotic. HEENT: Left eye strabismus NECK: No JVD; No carotid bruits CARDIAC: RRR, 3/6 systolic murmur heard throughout the precordium RESPIRATORY:  Clear  to auscultation without rales, wheezing or rhonchi  ABDOMEN: Soft, non-tender, non-distended MUSCULOSKELETAL:  no edema; warm. Has scoliosis SKIN: Warm and dry NEUROLOGIC:  Alert and oriented x 3 PSYCHIATRIC:  Normal affect   ASSESSMENT:    No diagnosis found.    PLAN:    In order of problems listed above:  No problem-specific Assessment & Plan notes found for this encounter.    #Paroxysmal Afib: CHADs-vasc 6. On apixaban and dilt. Flecainide  stopped due to advanced age. Currently in NSR. -Stopped flecainide given valve disease and advanced age -Continue apixaban '5mg'$  BID -Continue diltiazem '180mg'$  daily -ECG in 11/2021 shows she remains in NSR   #Chronic diastolic CHF: TTE 31/51/76 with LVEF 60-65%, moderate vs moderate to severe MR due to bileaflet prolapse. Currently with mild LE edema on exam. -Continue lasix  '20mg'$  M, W, F, Sun -Continue potassium chloride '10mg'$ Eq M, W, F, Sun -Continue losartan '25mg'$  daily  #Thoracic aortic aneurysm: Previously followed by Dr. Prescott Gum now transitioned to Dr. Orvan Seen. Managed conservatively as deemed to be not a surgical candidate -Continue losartan '25mg'$  daily -Continue pravsatatin '20mg'$    #Valvular heart disease with moderate MVP and moderate-to-severe MR, moderate TR mild AI and moderate pulmonary hypertension:  TTE 11/17/21 with LVEF 60-65%, moderate vs moderate to severe MR due to bileaflet prolapse. Given relative lack of symptoms and medical comorbidities, managing medically at this time.  -Medical management -Continue lasix  '20mg'$  M, W, F, Sun -Continue potassium chloride '10mg'$ Eq M, W, F, Sun -Continue losartan and dilt as above; BP well controlled -Check TTE for monitoring   #Hyperlipidemia: -Continue pravasatin '20mg'$  daily  #Unsteady Gait: #Possible lightheadedness: Patient reports that she feels off balance with episodes of lightheadedness but unclear if this is true lightheadedness or unsteady gait. This has been an ongoing issues. Has been followed by her PCP.  #Leg Pain: #Back Pain: Was seen by Dr. Tamala Julian but declined PT or further treatment at that time.    Follow-up in 6 months or PRN  Medication Adjustments/Labs and Tests Ordered: Current medicines are reviewed at length with the patient today.  Concerns regarding medicines are outlined above.  No orders of the defined types were placed in this encounter.  No orders of the defined types were placed in this  encounter.   There are no Patient Instructions on file for this visit.

## 2022-08-21 ENCOUNTER — Ambulatory Visit: Payer: Medicare Other | Attending: Cardiology | Admitting: Cardiology

## 2022-08-21 ENCOUNTER — Encounter: Payer: Self-pay | Admitting: Cardiology

## 2022-08-21 VITALS — BP 120/62 | HR 72 | Ht 59.0 in | Wt 102.6 lb

## 2022-08-21 DIAGNOSIS — I10 Essential (primary) hypertension: Secondary | ICD-10-CM | POA: Diagnosis not present

## 2022-08-21 DIAGNOSIS — Z79899 Other long term (current) drug therapy: Secondary | ICD-10-CM | POA: Diagnosis not present

## 2022-08-21 DIAGNOSIS — I480 Paroxysmal atrial fibrillation: Secondary | ICD-10-CM | POA: Diagnosis not present

## 2022-08-21 DIAGNOSIS — I110 Hypertensive heart disease with heart failure: Secondary | ICD-10-CM | POA: Diagnosis not present

## 2022-08-21 DIAGNOSIS — I509 Heart failure, unspecified: Secondary | ICD-10-CM | POA: Diagnosis not present

## 2022-08-21 DIAGNOSIS — M546 Pain in thoracic spine: Secondary | ICD-10-CM | POA: Insufficient documentation

## 2022-08-21 DIAGNOSIS — I080 Rheumatic disorders of both mitral and aortic valves: Secondary | ICD-10-CM | POA: Diagnosis not present

## 2022-08-21 DIAGNOSIS — G8929 Other chronic pain: Secondary | ICD-10-CM | POA: Insufficient documentation

## 2022-08-21 DIAGNOSIS — R2681 Unsteadiness on feet: Secondary | ICD-10-CM | POA: Diagnosis not present

## 2022-08-21 DIAGNOSIS — E7800 Pure hypercholesterolemia, unspecified: Secondary | ICD-10-CM | POA: Diagnosis not present

## 2022-08-21 DIAGNOSIS — I7121 Aneurysm of the ascending aorta, without rupture: Secondary | ICD-10-CM | POA: Diagnosis not present

## 2022-08-21 DIAGNOSIS — I5032 Chronic diastolic (congestive) heart failure: Secondary | ICD-10-CM | POA: Diagnosis not present

## 2022-08-21 DIAGNOSIS — I340 Nonrheumatic mitral (valve) insufficiency: Secondary | ICD-10-CM | POA: Diagnosis not present

## 2022-08-21 DIAGNOSIS — I252 Old myocardial infarction: Secondary | ICD-10-CM | POA: Diagnosis not present

## 2022-08-21 LAB — BASIC METABOLIC PANEL
BUN/Creatinine Ratio: 36 — ABNORMAL HIGH (ref 12–28)
BUN: 25 mg/dL (ref 8–27)
CO2: 24 mmol/L (ref 20–29)
Calcium: 9.6 mg/dL (ref 8.7–10.3)
Chloride: 98 mmol/L (ref 96–106)
Creatinine, Ser: 0.69 mg/dL (ref 0.57–1.00)
Glucose: 98 mg/dL (ref 70–99)
Potassium: 4.1 mmol/L (ref 3.5–5.2)
Sodium: 137 mmol/L (ref 134–144)
eGFR: 86 mL/min/{1.73_m2} (ref >59)

## 2022-08-21 MED ORDER — POTASSIUM CHLORIDE CRYS ER 10 MEQ PO TBCR: EXTENDED_RELEASE_TABLET | ORAL | 3 refills | Status: DC

## 2022-08-21 NOTE — Progress Notes (Signed)
Cardiology Office Note:    Date:  08/21/2022   ID:  [name], DOB 12-23-1935, MRN [id number]  PCP:  Corliss Blacker, MD   [facility] Providers Cardiologist:  Freada Bergeron, MD {  Referring MD: [name], MD    History of Present Illness:    [name] is a 87 y.o. female with a hx of pAfib, chronic diastolic CHF, hypertension, HLD, thoracic aneurysm previously followed by Dr. Lucianne Lei Trigt-not felt to be a candidate for repair given severe kyphosis who was previously followed by Dr. Meda Coffee who now returns to clinic for follow-up.  Per review of the record, TTE 11/28/2016 with LVEF 55 to 60%, mild to moderate AI, moderate MR, severe atrial enlargement, severe TR.  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 Also with paroxysmal Afib s/p spontaneously conversion to normal sinus rhythm on diltiazem and flecainide.  Myoview 01/2017 poor quality study but low risk EF 72%.  Echo 07/2018 EF 55 to 60% with grade 1 DD mildly dilated ascending aorta with moderate MVP with MR and moderate to severe dilated LA moderate TR moderate pulmonary hypertension.  Seen in clinic 03/22/21 where she was feeling okay with mild dyspnea on exertion. TTE at that time showed moderate holosystolic prolapse with moderate-to-severe MR with possible P2 flail. Saw Dr. Burt Knack on 06/05/21 where she was deemed best for medical management given relative lack of symptoms and comorbidities. Repeat TTE showed stable moderate vs moderate-to-severe MR.   Was last seen in clinic on 02/16/22 where she was dealing with a UTI and LLE pain and swelling. LE doppler was negative for DVT. We recommended she follow-up with her PCP for her UTI. She was otherwise stable from a CV standpoint. We stopped flecainide at that time due to advanced age and comorbidities.  Was last seen on 05/21/22 where she continued to have significant neuropathic and  arthritic pain. We increased her lasix to 4x/week instead of 3x/week for LE edema.She was otherwise stable from a CV standpoint.  Called clinic on 07/25/22 for continued balance issues and episodes of lightheadedness. She declined visit with NP that day. She was going to follow-up with PCP as well.   Today, the patient states that she continues to feel off balance and her legs feel heavy. No presyncope symptoms or lightheadedness. No low blood pressures. Volume status has improved with increased lasix dosing. She had gone to a NP at her family med office and asked about her symptoms and was referred for home health physical therapy.   Patient reports that she has noticed her breathing more often but denies any shortness of breath or orthopnea. Patient takes Lasix '20mg'$  4 days a week and has noticed her swelling has improved.   Pt denies low blood pressure, dizziness, near syncopal symptoms, and syncope. She reports occasional leg swelling and a cough.  Past Medical History:  Diagnosis Date   Anxiety    Aortic regurgitation    Breast cancer ([place]) 08/20/2011   R breast DCIS, ER/PR +   Cataract 3 and 10/92   bilateral   Chronic diastolic CHF (congestive heart failure) (HCC)    Compression fracture of fourth lumbar vertebra (HCC)    DVT (deep venous thrombosis) ([place]) 08/2011   LL extremity    Fibromyalgia    Fracture lumbar vertebra-closed (HCC)    Fracture of thoracic vertebra, closed ([place])    GERD (gastroesophageal  reflux disease)    H/O hiatal hernia    History of blood clots    History of radiation therapy 01/2012   R breast   Hypercholesteremia    Hypertension    DR Orinda Kenner   Hypothyroidism    Mitral regurgitation    Mitral valve prolapse    Osteoporosis    PAF (paroxysmal atrial fibrillation) ([place])    a. dx 11/2016.   Pulmonary hypertension ([place])    Rib fractures    Thoracic ascending aortic aneurysm ([place])    a. followed by Dr. Prescott Gum.   Tricuspid regurgitation      Past Surgical History:  Procedure Laterality Date   ANTERIOR CERVICAL DECOMP/DISCECTOMY FUSION N/A 09/09/2015   Procedure: Anterior Cervical Decompression and Fusion Cervical seven-Thorasic one ;  Surgeon: Erline Levine, MD;  Location: [place] NEUR[facility] ORS;  Service: Neurosurgery;  Laterality: N/A;   [place]  10/16/2006   RIH - Dr Hassell Done   KYPHOPLASTY N/A 01/26/2013   Procedure: KYPHOPLASTY;  Surgeon: Kristeen Miss, MD;  Location: [place] NEUR[facility] ORS;  Service: Neurosurgery;  Laterality: N/A;  T11 and L1   KYPHOPLASTY N/A 06/21/2017   Procedure: Lumbar four Kyphoplasty;  Surgeon: Erline Levine, MD;  Location: [place];  Service: Neurosurgery;  Laterality: N/A;   MASTECTOMY, PARTIAL  10/17/2011   Procedure: MASTECTOMY PARTIAL;  Surgeon: Haywood Lasso, MD;  Location: [place];  Service: General;  Laterality: Right;  needle guided   TEE WITHOUT CARDIOVERSION N/A 12/04/2016   Procedure: TRANSESOPHAGEAL ECHOCARDIOGRAM (TEE);  Surgeon: Pixie Casino, MD;  Location[facility];  Service: Cardiovascular;  Laterality: N/A;   TONSILLECTOMY  1944    Current Medications: Current Meds  Medication Sig   apixaban (ELIQUIS) 2.5 MG TABS tablet Take 1 tablet (2.5 mg total) by mouth 2 (two) times daily.   Ascorbic Acid (VITA-C PO) Take by mouth.   calcium citrate-vitamin D (CITRACAL+D) 315-200 MG-UNIT per tablet Take 1 tablet by mouth 2 (two) times daily.   cholecalciferol (VITAMIN D) 1000 UNITS tablet Take 1,000 Units by mouth daily with lunch. Vitamin D3   diltiazem (CARDIZEM CD) 180 MG 24 hr capsule Take 1 capsule daily   Docusate Calcium (STOOL SOFTENER PO) Take by mouth.   estradiol (ESTRACE) 0.1 MG/GM vaginal cream Place 1 Applicatorful vaginally once a week.   furosemide (LASIX) 20 MG tablet Take one tablet by mouth on Monday, Wed, Fridays, and Sunday   HYDROcodone-acetaminophen (NORCO/VICODIN) 5-325 MG tablet  Take 1-2 tablets by mouth every 4 (four) hours as needed (mild pain).   iron polysaccharides (NIFEREX) 150 MG capsule Take 150 mg by mouth 3 (three) times a week.    levothyroxine (SYNTHROID, LEVOTHROID) 125 MCG tablet Take 125 mcg by mouth daily before breakfast.   losartan (COZAAR) 25 MG tablet Take 1 tablet (25 mg total) by mouth daily.   Magnesium 200 MG TABS Take 1 tablet (200 mg total) by mouth daily.   Meclizine HCl (BONINE PO) Take by mouth as needed.   Multiple Vitamin (MULTIVITAMIN) tablet Take 1 tablet by mouth daily with breakfast.   omeprazole (PRILOSEC OTC) 20 MG tablet Take 20 mg by mouth daily before breakfast.    potassium chloride (KLOR-CON M) 10 MEQ tablet Take 1 tablet (10 mEq total) by mouth daily on Mon, Wed, Fri, and Sundays, with lasix administration.   pravastatin (PRAVACHOL) 20  MG tablet Take 20 mg by mouth daily with supper.    risedronate (ACTONEL) 150 MG tablet Take 150 mg by mouth every 30 (thirty) days. with water on empty stomach, nothing by mouth or lie down for next 30 minutes.   solifenacin (VESICARE) 5 MG tablet Take 5 mg by mouth daily with breakfast.    TART CHERRY PO Take by mouth.   [DISCONTINUED] potassium chloride (KLOR-CON) 10 MEQ tablet Take 10 mEq by mouth 3 (three) times a week.     Allergies:   Contrast media [iodinated contrast media], Iohexol, Mucinex [guaifenesin er], Zanaflex [tizanidine hcl], Penicillins, and Robaxin [methocarbamol]   Social History   Socioeconomic History   Marital status: Married    Spouse name: Not on file   Number of children: Not on file   Years of education: Not on file   Highest education level: Not on file  Occupational History   Not on file  Tobacco Use   Smoking status: Never   Smokeless tobacco: Never  Vaping Use   Vaping Use: Never used  Substance and Sexual Activity   Alcohol use: No   Drug use: No   Sexual activity: Never  Other Topics Concern   Not on file  Social History Narrative   Not on  file   Social Determinants of Health   Financial Resource Strain: Not on file  Food Insecurity: Not on file  Transportation Needs: Not on file  Physical Activity: Not on file  Stress: Not on file  Social Connections: Not on file     Family History: The patient's family history includes Heart disease in her maternal grandfather, maternal uncle, and mother.  ROS:   Please see the history of present illness.   Review of Systems  Constitutional:  Positive for malaise/fatigue. Negative for chills and fever.  HENT:  Negative for nosebleeds and tinnitus.   Eyes:  Negative for blurred vision and pain.  Respiratory:  Positive for cough. Negative for hemoptysis, shortness of breath and stridor.   Cardiovascular:  Positive for leg swelling. Negative for chest pain, palpitations, orthopnea, claudication and PND.  Gastrointestinal:  Negative for blood in stool, diarrhea, nausea and vomiting.  Genitourinary:  Negative for dysuria and hematuria.  Musculoskeletal:  Positive for back pain and myalgias (lower extremity). Negative for falls.  Neurological:  Positive for tingling (lower extremity) and weakness. Negative for dizziness, loss of consciousness and headaches.       + loss of balance  Psychiatric/Behavioral:  Negative for depression, hallucinations and substance abuse. The patient does not have insomnia.     EKGs/Labs/Other Studies Reviewed:    The following studies were reviewed today: TTE 12/07/21: IMPRESSIONS   1. Left ventricular ejection fraction, by estimation, is 60 to 65%. The left ventricle has normal function. The left ventricle has no regional wall motion abnormalities. There is moderate hypertrophy of the basal septal segment. The rest of the LV  segments demonstrate mild asymmetric left ventricular hypertrophy of the basal-septal segment. Left ventricular diastolic parameters are consistent with Grade I diastolic dysfunction (impaired relaxation).   2. Right ventricular  systolic function is normal. The right ventricular size is mildly enlarged. There is severely elevated pulmonary artery systolic pressure. The estimated right ventricular systolic pressure is  81.8 mmHg.   3. Left atrial size was severely dilated.   4. Right atrial size was mildly dilated.   5. The mitral valve is myxomatous. There is bileaflet prolapse that is more notable in the posterior mitral valve  leaflet. There is moderate mitral regurgitation EROA 0.23 with Rvol 21m.   6. Tricuspid valve regurgitation is moderate to severe.   7. The aortic valve is tricuspid. There is mild calcification of the aortic valve. There is mild thickening of the aortic valve. Aortic valve regurgitation is mild to moderate.   8. Aortic dilatation noted. There is moderate-to-severe dilatation of the ascending aorta, measuring 49 mm. There is mild dilatation of the aortic root, measuring 40 mm.   Comparison(s): Compared to prior TTE in 04/2021, the mitral regurgitation  appears moderate on current study which is slightly less than prior in the  setting of a lower systolic blood pressure. The ascending aortic size  remains stably dilated at 413m (previously 5049m   TTE 04/2021: IMPRESSIONS   1. Left ventricular ejection fraction, by estimation, is 60 to 65%. The left ventricle has normal function. The left ventricle has no regional wall motion abnormalities. There is moderate asymmetric hypertrophy of the basal septum. The rest of the LV  segments demonstrate mild left ventricular hypertrophy. Left ventricular diastolic parameters are consistent with Grade I diastolic dysfunction (impaired relaxation).   2. Right ventricular systolic function is normal. The right ventricular size is normal. There is mildly elevated pulmonary artery systolic pressure. The estimated right ventricular systolic pressure is 45.95.1Hg.   3. Left atrial size was severely dilated.   4. The mitral valve is myxomatous. There is moderate  holosystolic prolapse of of the mitral valve most notably of the posterior mitral valve leaflet with possible P2 flail. Using a PISA radius of 0.9, there is at least moderate-to-severe mitral  regurgitation with EROA 0.3cm2, RVol 16m20mecommend TEE for further evaluation.   5. Tricuspid valve regurgitation is moderate.   6. The aortic valve is tricuspid. There is mild calcification of the  aortic valve. There is mild thickening of the aortic valve. Aortic valve regurgitation is mild to moderate. Mild to moderate aortic valve sclerosis/calcification is present, without any evidence of aortic stenosis.   7. Aortic dilatation noted. There is mild dilatation of the aortic root, measuring 39 mm. There is moderate-to-severe dilatation of the ascending aorta, measuring 50 mm.   8. The inferior vena cava is normal in size with greater than 50% respiratory variability, suggesting right atrial pressure of 3 mmHg.   9. Right atrial size was mildly dilated.   Comparison(s): Compared to prior echo, the MR now appears severe. The ascending aorta now measures 50mm89meviously 41mm)57mD echo 08/06/2018 Study Conclusions   - Left ventricle: The cavity size was normal. There was severe    focal basal hypertrophy. Systolic function was normal. The    estimated ejection fraction was in the range of 55% to 60%.   Wall motion was normal; there were no regional wall motion    abnormalities. There was an increased relative contribution of    atrial contraction to ventricular filling. Doppler parameters are    consistent with abnormal left ventricular relaxation (grade 1    diastolic dysfunction). Doppler parameters are consistent with    high ventricular filling pressure.  - Aortic valve: Trileaflet; mildly thickened, mildly calcified    leaflets. There was mild regurgitation.  - Aorta: Ascending aorta diameter: 41 mm (ED).  - Ascending aorta: The ascending aorta was mildly dilated.  - Mitral valve: Mild diffuse  thickening, consistent with myxomatous    proliferation. Moderate, holosystolic prolapse, involving the    posterior > anterior leaflet. There was moderate regurgitation.  Effective regurgitant orifice (PISA): 0.15 cm^2. Regurgitant    volume (PISA): 32 ml.  - Left atrium: The atrium was moderately to severely dilated.  - Tricuspid valve: There was moderate regurgitation.  - Pulmonary arteries: PA peak pressure: 45 mm Hg (S).   Impressions:   - The right ventricular systolic pressure was increased consistent    with moderate pulmonary hypertension.  Myoview 01/2017: The nuclear study is very poor quality due to body habitus Defect 1: There is a defect present in the basal inferolateral and mid inferolateral location that is most likely artifact due to uptake in structures below the diaphragm. This is a low risk study. Nuclear stress EF: 72%. The left ventricular ejection fraction is hyperdynamic (>65%).     EKG:  05/21/2022 EKG: NSR, iRBBB, LAFB, PAC HR 87 02/16/2022: not ordered today 03/22/21: NSR, LAFB, anterior infarct (unchanged), first degree AVB, HR 65  Recent Labs: 11/23/2021: Magnesium 2.3 06/11/2022: ALT 18; Hemoglobin 13.4; Platelets 167.0; TSH 0.34 07/05/2022: BUN 24; Creatinine, Ser 0.71; Potassium 4.4; Sodium 140  Recent Lipid Panel    Component Value Date/Time   CHOL  12/02/2010 0400    149        ATP III CLASSIFICATION:  <200     mg/dL   Desirable  200-239  mg/dL   Borderline High  >=240    mg/dL   High          TRIG 54 12/02/2010 0400   HDL 58 12/02/2010 0400   CHOLHDL 2.6 12/02/2010 0400   VLDL 11 12/02/2010 0400   LDLCALC  12/02/2010 0400    80        Total Cholesterol/HDL:CHD Risk Coronary Heart Disease Risk Table                     Men   Women  1/2 Average Risk   3.4   3.3  Average Risk       5.0   4.4  2 X Average Risk   9.6   7.1  3 X Average Risk  23.4   11.0        Use the calculated Patient Ratio above and the CHD Risk Table to determine  the patient's CHD Risk.        ATP III CLASSIFICATION (LDL):  <100     mg/dL   Optimal  100-129  mg/dL   Near or Above                    Optimal  130-159  mg/dL   Borderline  160-189  mg/dL   High  >190     mg/dL   Very High     Risk Assessment/Calculations:    CHA2DS2-VASc Score = 6  This indicates a 9.7% annual risk of stroke. The patient's score is based upon: CHF History: 1 HTN History: 1 Diabetes History: 0 Stroke History: 0 Vascular Disease History: 1 Age Score: 2 Gender Score: 1     Physical Exam:    VS:  BP 120/62   Pulse 72   Ht '4\' 11"'$  (1.499 m)   Wt 102 lb 9.6 oz (46.5 kg)   SpO2 99%   BMI 20.72 kg/m     Wt Readings from Last 3 Encounters:  08/21/22 102 lb 9.6 oz (46.5 kg)  07/23/22 101 lb (45.8 kg)  06/11/22 103 lb (46.7 kg)     GEN: Elderly female, kyphotic HEENT:  Left eye strabismus NECK: No JVD; No carotid  bruits CARDIAC: RRR, 3/6 systolic murmur heard throughout the precordium RESPIRATORY:  CTAB ABDOMEN: Soft, non-tender, non-distended MUSCULOSKELETAL:Warm, no edema, scoliosis SKIN: Warm and dry NEUROLOGIC:  Alert and oriented x 3 PSYCHIATRIC:  Normal affect   ASSESSMENT:    1. Chronic diastolic CHF (congestive heart failure) ([place])   2. Essential hypertension   3. PAF (paroxysmal atrial fibrillation) ([place])   4. Severe mitral regurgitation   5. Medication management   6. Aneurysm of ascending aorta without rupture ([place])   7. Unsteady gait   8. Chronic bilateral thoracic back pain   9. Mitral valve insufficiency, unspecified etiology       PLAN:    In order of problems listed above:  No problem-specific Assessment & Plan notes found for this encounter.   #Unsteady Gait: Patient reports leg heaviness and balance issues that have been ongoing. No presyncopal symptoms, chest pain, palpitations or lightheadedness. I suspect this is related to her underlying spine disease and reassured her this is not likely to be cardiac in  nature. Agree with PT.   #Paroxysmal Afib: CHADs-vasc 6. On apixaban and dilt. Flecainide stopped due to advanced age. Currently in NSR. -Stopped flecainide given valve disease and advanced age -Continue apixaban '5mg'$  BID -Continue diltiazem '180mg'$  daily -ECG in 11/2021 shows she remains in NSR   #Chronic diastolic CHF: TTE 54/62/70 with LVEF 60-65%, moderate vs moderate to severe MR due to bileaflet prolapse. Currently euvolemic on exam. -Continue lasix  '20mg'$  M, W, F, Sun -Continue potassium chloride '10mg'$ Eq M, W, F, Sun -Continue losartan '25mg'$  daily -Check BMET  #Thoracic aortic aneurysm: Previously followed by Dr. Prescott Gum now transitioned to Dr. Orvan Seen. Managed conservatively as deemed to be not a surgical candidate -Continue losartan '25mg'$  daily -Continue pravsatatin '20mg'$    #Valvular heart disease with moderate MVP and moderate-to-severe MR, moderate TR mild AI and moderate pulmonary hypertension:  TTE 11/17/21 with LVEF 60-65%, moderate vs moderate to severe MR due to bileaflet prolapse. Given relative lack of symptoms and medical comorbidities, managing medically at this time.  -Medical management -Continue lasix  '20mg'$  M, W, F, Sun -Continue potassium chloride '10mg'$ Eq M, W, F, Sun -Continue losartan and dilt as above; BP well controlled -Check TTE for monitoring   #Hyperlipidemia: -Continue pravasatin '20mg'$  daily  #Leg Pain: #Back Pain: Was seen by Dr. Tamala Julian. Now working with PT  Medication Adjustments/Labs and Tests Ordered: Current medicines are reviewed at length with the patient today.  Concerns regarding medicines are outlined above.  Orders Placed This Encounter  Procedures   Basic metabolic panel   Meds ordered this encounter  Medications   potassium chloride (KLOR-CON M) 10 MEQ tablet    Sig: Take 1 tablet (10 mEq total) by mouth daily on Mon, Wed, Fri, and Sundays, with lasix administration.    Dispense:  48 tablet    Refill:  3    Dose increase--take in  concurrence with lasix    Patient Instructions  Medication Instructions:   TAKE YOUR POTASSIUM CHLORIDE 10 mEq BY MOUTH DAILY ON MONDAYS, WEDNESDAYS, FRIDAYS, AND SUNDAYS, WHEN YOU ADMINISTER YOUR LASIX.  *If you need a refill on your cardiac medications before your next appointment, please call your pharmacy*   Lab Work:  TODAY--BMET  If you have labs (blood work) drawn today and your tests are completely normal, you will receive your results only by: [place] (if you have MyChart) OR A paper copy in the mail If you have any lab test that is abnormal or we  need to change your treatment, we will call you to review the results.    Follow-Up:  4 MONTHS WITH AN EXTENDER OR DR. Johney Frame     Important Information About Sugar        This document serves as a record of services personally performed by Gwyndolyn Kaufman, MD. It was created on her behalf by Eugene Gavia, a trained medical scribe. The creation of this record is based on the scribe's personal observations and the provider's statements to them. This document has been checked and approved by the attending provider.  I, Freada Bergeron, MD, have reviewed all documentation for this visit. The documentation on 08/21/22 for the exam, diagnosis, procedures, and orders are all accurate and complete.   Gwyndolyn Kaufman, MD

## 2022-08-21 NOTE — Patient Instructions (Signed)
Medication Instructions:   TAKE YOUR POTASSIUM CHLORIDE 10 mEq BY MOUTH DAILY ON MONDAYS, WEDNESDAYS, FRIDAYS, AND SUNDAYS, WHEN YOU ADMINISTER YOUR LASIX.  *If you need a refill on your cardiac medications before your next appointment, please call your pharmacy*   Lab Work:  TODAY--BMET  If you have labs (blood work) drawn today and your tests are completely normal, you will receive your results only by: MyChart Message (if you have MyChart) OR A paper copy in the mail If you have any lab test that is abnormal or we need to change your treatment, we will call you to review the results.    Follow-Up:  4 MONTHS WITH AN EXTENDER OR DR. Johney Frame     Important Information About Sugar

## 2022-08-28 DIAGNOSIS — N8111 Cystocele, midline: Secondary | ICD-10-CM | POA: Diagnosis not present

## 2022-08-28 DIAGNOSIS — Z4689 Encounter for fitting and adjustment of other specified devices: Secondary | ICD-10-CM | POA: Diagnosis not present

## 2022-08-30 DIAGNOSIS — I509 Heart failure, unspecified: Secondary | ICD-10-CM | POA: Diagnosis not present

## 2022-08-30 DIAGNOSIS — M353 Polymyalgia rheumatica: Secondary | ICD-10-CM | POA: Diagnosis not present

## 2022-08-30 DIAGNOSIS — R5382 Chronic fatigue, unspecified: Secondary | ICD-10-CM | POA: Diagnosis not present

## 2022-08-30 DIAGNOSIS — I480 Paroxysmal atrial fibrillation: Secondary | ICD-10-CM | POA: Diagnosis not present

## 2022-08-30 DIAGNOSIS — M810 Age-related osteoporosis without current pathological fracture: Secondary | ICD-10-CM | POA: Diagnosis not present

## 2022-08-30 DIAGNOSIS — M797 Fibromyalgia: Secondary | ICD-10-CM | POA: Diagnosis not present

## 2022-08-30 DIAGNOSIS — M7072 Other bursitis of hip, left hip: Secondary | ICD-10-CM | POA: Diagnosis not present

## 2022-08-30 DIAGNOSIS — I872 Venous insufficiency (chronic) (peripheral): Secondary | ICD-10-CM | POA: Diagnosis not present

## 2022-08-30 DIAGNOSIS — E039 Hypothyroidism, unspecified: Secondary | ICD-10-CM | POA: Diagnosis not present

## 2022-08-30 DIAGNOSIS — I7121 Aneurysm of the ascending aorta, without rupture: Secondary | ICD-10-CM | POA: Diagnosis not present

## 2022-08-30 DIAGNOSIS — K219 Gastro-esophageal reflux disease without esophagitis: Secondary | ICD-10-CM | POA: Diagnosis not present

## 2022-08-30 DIAGNOSIS — I080 Rheumatic disorders of both mitral and aortic valves: Secondary | ICD-10-CM | POA: Diagnosis not present

## 2022-08-30 DIAGNOSIS — M545 Low back pain, unspecified: Secondary | ICD-10-CM | POA: Diagnosis not present

## 2022-08-30 DIAGNOSIS — Z853 Personal history of malignant neoplasm of breast: Secondary | ICD-10-CM | POA: Diagnosis not present

## 2022-08-30 DIAGNOSIS — Z7901 Long term (current) use of anticoagulants: Secondary | ICD-10-CM | POA: Diagnosis not present

## 2022-08-30 DIAGNOSIS — Z86718 Personal history of other venous thrombosis and embolism: Secondary | ICD-10-CM | POA: Diagnosis not present

## 2022-08-30 DIAGNOSIS — M1990 Unspecified osteoarthritis, unspecified site: Secondary | ICD-10-CM | POA: Diagnosis not present

## 2022-08-30 DIAGNOSIS — Z9181 History of falling: Secondary | ICD-10-CM | POA: Diagnosis not present

## 2022-08-30 DIAGNOSIS — I252 Old myocardial infarction: Secondary | ICD-10-CM | POA: Diagnosis not present

## 2022-08-30 DIAGNOSIS — I110 Hypertensive heart disease with heart failure: Secondary | ICD-10-CM | POA: Diagnosis not present

## 2022-08-30 DIAGNOSIS — H53003 Unspecified amblyopia, bilateral: Secondary | ICD-10-CM | POA: Diagnosis not present

## 2022-08-30 DIAGNOSIS — R519 Headache, unspecified: Secondary | ICD-10-CM | POA: Diagnosis not present

## 2022-08-30 DIAGNOSIS — E7800 Pure hypercholesterolemia, unspecified: Secondary | ICD-10-CM | POA: Diagnosis not present

## 2022-08-30 DIAGNOSIS — Z8571 Personal history of Hodgkin lymphoma: Secondary | ICD-10-CM | POA: Diagnosis not present

## 2022-08-30 DIAGNOSIS — N3281 Overactive bladder: Secondary | ICD-10-CM | POA: Diagnosis not present

## 2022-09-06 DIAGNOSIS — E7800 Pure hypercholesterolemia, unspecified: Secondary | ICD-10-CM | POA: Diagnosis not present

## 2022-09-06 DIAGNOSIS — I509 Heart failure, unspecified: Secondary | ICD-10-CM | POA: Diagnosis not present

## 2022-09-06 DIAGNOSIS — I252 Old myocardial infarction: Secondary | ICD-10-CM | POA: Diagnosis not present

## 2022-09-06 DIAGNOSIS — I080 Rheumatic disorders of both mitral and aortic valves: Secondary | ICD-10-CM | POA: Diagnosis not present

## 2022-09-06 DIAGNOSIS — I480 Paroxysmal atrial fibrillation: Secondary | ICD-10-CM | POA: Diagnosis not present

## 2022-09-06 DIAGNOSIS — I110 Hypertensive heart disease with heart failure: Secondary | ICD-10-CM | POA: Diagnosis not present

## 2022-09-07 DIAGNOSIS — I509 Heart failure, unspecified: Secondary | ICD-10-CM | POA: Diagnosis not present

## 2022-09-07 DIAGNOSIS — Z1331 Encounter for screening for depression: Secondary | ICD-10-CM | POA: Diagnosis not present

## 2022-09-07 DIAGNOSIS — E782 Mixed hyperlipidemia: Secondary | ICD-10-CM | POA: Diagnosis not present

## 2022-09-07 DIAGNOSIS — Z0000 Encounter for general adult medical examination without abnormal findings: Secondary | ICD-10-CM | POA: Diagnosis not present

## 2022-09-07 DIAGNOSIS — Z23 Encounter for immunization: Secondary | ICD-10-CM | POA: Diagnosis not present

## 2022-09-07 DIAGNOSIS — Z86718 Personal history of other venous thrombosis and embolism: Secondary | ICD-10-CM | POA: Diagnosis not present

## 2022-09-07 DIAGNOSIS — M810 Age-related osteoporosis without current pathological fracture: Secondary | ICD-10-CM | POA: Diagnosis not present

## 2022-09-07 DIAGNOSIS — G629 Polyneuropathy, unspecified: Secondary | ICD-10-CM | POA: Diagnosis not present

## 2022-09-07 DIAGNOSIS — E039 Hypothyroidism, unspecified: Secondary | ICD-10-CM | POA: Diagnosis not present

## 2022-09-07 DIAGNOSIS — I712 Thoracic aortic aneurysm, without rupture, unspecified: Secondary | ICD-10-CM | POA: Diagnosis not present

## 2022-09-07 DIAGNOSIS — D509 Iron deficiency anemia, unspecified: Secondary | ICD-10-CM | POA: Diagnosis not present

## 2022-09-07 DIAGNOSIS — S22000A Wedge compression fracture of unspecified thoracic vertebra, initial encounter for closed fracture: Secondary | ICD-10-CM | POA: Diagnosis not present

## 2022-09-07 DIAGNOSIS — B351 Tinea unguium: Secondary | ICD-10-CM | POA: Diagnosis not present

## 2022-09-11 DIAGNOSIS — E7800 Pure hypercholesterolemia, unspecified: Secondary | ICD-10-CM | POA: Diagnosis not present

## 2022-09-11 DIAGNOSIS — I509 Heart failure, unspecified: Secondary | ICD-10-CM | POA: Diagnosis not present

## 2022-09-11 DIAGNOSIS — I252 Old myocardial infarction: Secondary | ICD-10-CM | POA: Diagnosis not present

## 2022-09-11 DIAGNOSIS — I480 Paroxysmal atrial fibrillation: Secondary | ICD-10-CM | POA: Diagnosis not present

## 2022-09-11 DIAGNOSIS — I110 Hypertensive heart disease with heart failure: Secondary | ICD-10-CM | POA: Diagnosis not present

## 2022-09-11 DIAGNOSIS — I080 Rheumatic disorders of both mitral and aortic valves: Secondary | ICD-10-CM | POA: Diagnosis not present

## 2022-09-20 ENCOUNTER — Ambulatory Visit (INDEPENDENT_AMBULATORY_CARE_PROVIDER_SITE_OTHER): Payer: Medicare Other | Admitting: Podiatry

## 2022-09-20 DIAGNOSIS — M79674 Pain in right toe(s): Secondary | ICD-10-CM

## 2022-09-20 DIAGNOSIS — B351 Tinea unguium: Secondary | ICD-10-CM

## 2022-09-20 DIAGNOSIS — I509 Heart failure, unspecified: Secondary | ICD-10-CM | POA: Diagnosis not present

## 2022-09-20 DIAGNOSIS — E7800 Pure hypercholesterolemia, unspecified: Secondary | ICD-10-CM | POA: Diagnosis not present

## 2022-09-20 DIAGNOSIS — I080 Rheumatic disorders of both mitral and aortic valves: Secondary | ICD-10-CM | POA: Diagnosis not present

## 2022-09-20 DIAGNOSIS — I252 Old myocardial infarction: Secondary | ICD-10-CM | POA: Diagnosis not present

## 2022-09-20 DIAGNOSIS — I110 Hypertensive heart disease with heart failure: Secondary | ICD-10-CM | POA: Diagnosis not present

## 2022-09-20 DIAGNOSIS — M79675 Pain in left toe(s): Secondary | ICD-10-CM | POA: Diagnosis not present

## 2022-09-20 DIAGNOSIS — I480 Paroxysmal atrial fibrillation: Secondary | ICD-10-CM | POA: Diagnosis not present

## 2022-09-20 NOTE — Progress Notes (Signed)
  Subjective:  Patient ID: [name], female    DOB: [birth year],  MRN: [id number]  Chief Complaint  Patient presents with   Nail Problem    Thick painful toenails   Peripheral Neuropathy    86 y.o. female presents with the above complaint. History confirmed with patient.   Objective:  Physical Exam: warm, good capillary refill, no trophic changes or ulcerative lesions, normal DP and PT pulses, and normal sensory exam. Left Foot: dystrophic yellowed discolored nail plates with subungual debris Right Foot: dystrophic yellowed discolored nail plates with subungual debris  Assessment:   1. Pain due to onychomycosis of toenails of both feet      Plan:  Patient was evaluated and treated and all questions answered.  Discussed the etiology and treatment options for the condition in detail with the patient. Educated patient on the topical and oral treatment options for mycotic nails. Recommended debridement of the nails today. Sharp and mechanical debridement performed of all painful and mycotic nails today. Nails debrided in length and thickness using a nail nipper to level of comfort. Discussed treatment options including appropriate shoe gear. Follow up as needed for painful nails.    Return in about 3 months (around 12/21/2022) for Painful thick nails.

## 2022-09-25 DIAGNOSIS — I509 Heart failure, unspecified: Secondary | ICD-10-CM | POA: Diagnosis not present

## 2022-09-25 DIAGNOSIS — E7800 Pure hypercholesterolemia, unspecified: Secondary | ICD-10-CM | POA: Diagnosis not present

## 2022-09-25 DIAGNOSIS — I110 Hypertensive heart disease with heart failure: Secondary | ICD-10-CM | POA: Diagnosis not present

## 2022-09-25 DIAGNOSIS — I252 Old myocardial infarction: Secondary | ICD-10-CM | POA: Diagnosis not present

## 2022-09-25 DIAGNOSIS — I480 Paroxysmal atrial fibrillation: Secondary | ICD-10-CM | POA: Diagnosis not present

## 2022-09-25 DIAGNOSIS — I080 Rheumatic disorders of both mitral and aortic valves: Secondary | ICD-10-CM | POA: Diagnosis not present

## 2022-10-10 ENCOUNTER — Other Ambulatory Visit: Payer: Self-pay | Admitting: Internal Medicine

## 2022-10-10 ENCOUNTER — Ambulatory Visit
Admission: RE | Admit: 2022-10-10 | Discharge: 2022-10-10 | Disposition: A | Discharge status: Home / Self Care | Payer: Medicare Other | Source: Ambulatory Visit | Attending: Internal Medicine | Admitting: Internal Medicine

## 2022-10-10 DIAGNOSIS — R0789 Other chest pain: Secondary | ICD-10-CM

## 2022-10-10 DIAGNOSIS — Z9989 Dependence on other enabling machines and devices: Secondary | ICD-10-CM | POA: Diagnosis not present

## 2022-10-10 DIAGNOSIS — S299XXA Unspecified injury of thorax, initial encounter: Secondary | ICD-10-CM | POA: Diagnosis not present

## 2022-10-10 DIAGNOSIS — K449 Diaphragmatic hernia without obstruction or gangrene: Secondary | ICD-10-CM | POA: Diagnosis not present

## 2022-10-10 DIAGNOSIS — I517 Cardiomegaly: Secondary | ICD-10-CM | POA: Diagnosis not present

## 2022-10-10 DIAGNOSIS — M810 Age-related osteoporosis without current pathological fracture: Secondary | ICD-10-CM | POA: Diagnosis not present

## 2022-10-15 ENCOUNTER — Telehealth: Payer: Self-pay | Admitting: Cardiology

## 2022-10-15 NOTE — Progress Notes (Unsigned)
Office Visit    Patient Name: [name] Date of Encounter: 10/16/2022  PCP:  Corliss Blacker, MD   [place]  Cardiologist:  Freada Bergeron, MD  Advanced Practice Provider:  No care team member to display Electrophysiologist:  None   HPI    [name] is a 86 y.o. female with a past medical history of atrial fibrillation, chronic diastolic CHF, hypertension, HLD, thoracic aneurysm previously followed by Dr. Prescott Gum (not felt to be a candidate for repair given severe kyphosis) presents today for routine follow-up.  Per review of record, TTE 1/70/18 with LVEF 55 to 60%, mild to moderate AI, moderate MR, severe atrial enlargement, severe TR.  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  Also, with paroxysmal atrial fibrillation status post spontaneously converted to normal sinus rhythm on diltiazem and flecainide.  Myoview 3/18 was poor quality but was low risk EF 72%.  Echo 9/19 with EF 55 to 60% with grade 1 DD, mildly dilated ascending aorta with moderate MVP with MR and moderate to severe dilated LA, moderate TR, moderate pulmonary hypertension.  Seen in the clinic 03/22/2021 where she was feeling okay with mild dyspnea exertion.  TTE at that time showed moderate holosystolic prolapse with moderate to severe MR possible P2 flail.  Saw Dr. Burt Knack 06/05/2021 where she was deemed best for medical management given relative lack of symptoms and comorbidities.  Repeat TTE showed stable moderate versus moderate to severe MR.    She was seen 02/16/2022 where she was dealing with a UTI and LLE pain and swelling.  LE Doppler was negative for DVT.  Recommended she follow-up with PCP for UTI.  She was otherwise stable from a CV standpoint.  Stop flecainide at that time due to advanced age and comorbidities.  Was seen 05/21/2022 where she continued to have significant neuropathic and arthritic  pain.  Increase Lasix to 4 times a week instead of 3 times a week for lower extremity edema.  Otherwise stable.  She called the clinic on 07/25/2022 for continued balance issues and episodes of lightheadedness.  Declined at visit with NP that day.  She was going to follow-up with PCP.  She was seen 08/21/2022 and she continued to feel off balance and her legs were heavy.  No presyncope or syncope.  No lightheadedness.  No low blood pressures.  Volume status had improved.  She had gone to NP at her family med practice and asked about her symptoms and was referred to home health physical therapy.  Patient reported that she noticed her breathing more often but denied any shortness of breath or orthopnea.  Patient does take Lasix 20 mg 4 days a week and noticed an improvement of her swelling.  Today, she is having lots of right shoulder pain and she was told to take a depression medication to help with her pain but she said no. She was told to get a lidocaine patch but she was worried about her skin tearing. She has not been having some SOB, sometimes chest is tight. She states that the tightness is about the same when she is moving around and when she is sitting. She has chronic pain and thinks its referred pain from her back. Not exertional. She is also having some lightheadedness and experienced a fall a few days ago without hitting her head. She had an CXR which  was reviewed without fracture. She uses a walker for balance issues. We discussed proper hydration and nutrition. Her weight has been right around 100 lbs for the last few months. Se has experienced dizziness/lightheadedness in the past and no one has figured out the cause. BP stable today.   Reports no shortness of breath nor dyspnea on exertion. Reports no chest pain, pressure, or tightness. No edema, orthopnea, PND. Reports no palpitations.   Past Medical History    Past Medical History:  Diagnosis Date   Anxiety    Aortic regurgitation     Breast cancer ([place]) 08/20/2011   R breast DCIS, ER/PR +   Cataract 3 and 10/92   bilateral   Chronic diastolic CHF (congestive heart failure) (HCC)    Compression fracture of fourth lumbar vertebra (HCC)    DVT (deep venous thrombosis) ([place]) 08/2011   LL extremity    Fibromyalgia    Fracture lumbar vertebra-closed (HCC)    Fracture of thoracic vertebra, closed (HCC)    GERD (gastroesophageal reflux disease)    H/O hiatal hernia    History of blood clots    History of radiation therapy 01/2012   R breast   Hypercholesteremia    Hypertension    DR Orinda Kenner   Hypothyroidism    Mitral regurgitation    Mitral valve prolapse    Osteoporosis    PAF (paroxysmal atrial fibrillation) ([place])    a. dx 11/2016.   Pulmonary hypertension ([place])    Rib fractures    Thoracic ascending aortic aneurysm ([place])    a. followed by Dr. Prescott Gum.   Tricuspid regurgitation    Past Surgical History:  Procedure Laterality Date   ANTERIOR CERVICAL DECOMP/DISCECTOMY FUSION N/A 09/09/2015   Procedure: Anterior Cervical Decompression and Fusion Cervical seven-Thorasic one ;  Surgeon: Erline Levine, MD;  Location: [facility];  Service: Neurosurgery;  Laterality: N/A;   [place]  10/16/2006   RIH - Dr Hassell Done   KYPHOPLASTY N/A 01/26/2013   Procedure: KYPHOPLASTY;  Surgeon: Kristeen Miss, MD;  Location: [facility];  Service: Neurosurgery;  Laterality: N/A;  T11 and L1   KYPHOPLASTY N/A 06/21/2017   Procedure: Lumbar four Kyphoplasty;  Surgeon: Erline Levine, MD;  Location: [facility];  Service: Neurosurgery;  Laterality: N/A;   MASTECTOMY, PARTIAL  10/17/2011   Procedure: MASTECTOMY PARTIAL;  Surgeon: Haywood Lasso, MD;  Location: [facility];  Service: General;  Laterality: Right;  needle guided   TEE WITHOUT CARDIOVERSION N/A 12/04/2016   Procedure: TRANSESOPHAGEAL ECHOCARDIOGRAM (TEE);  Surgeon: Pixie Casino,  MD;  Location: [facility];  Service: Cardiovascular;  Laterality: N/A;   TONSILLECTOMY  1944    Allergies  Allergies  Allergen Reactions   Contrast Media [Iodinated Contrast Media] Shortness Of Breath   Iohexol Shortness Of Breath   Mucinex [Guaifenesin Er] Other (See Comments)    DIZZINESS   Zanaflex [Tizanidine Hcl] Anxiety and Other (See Comments)    Dizziness   Penicillins Rash    Has patient had a PCN reaction causing immediate rash, facial/tongue/throat swelling, SOB or lightheadedness with hypotension: No Has patient had a PCN reaction causing severe rash involving mucus membranes or skin necrosis: No Has patient had a PCN reaction that required hospitalization No Has patient had a PCN reaction occurring within the last 10 years: No If all of the above  answers are "NO", then may proceed with Cephalosporin use.    Robaxin [Methocarbamol] Diarrhea     EKGs/Labs/Other Studies Reviewed:   The following studies were reviewed today: TTE December 11, 2021: IMPRESSIONS   1. Left ventricular ejection fraction, by estimation, is 60 to 65%. The left ventricle has normal function. The left ventricle has no regional wall motion abnormalities. There is moderate hypertrophy of the basal septal segment. The rest of the LV  segments demonstrate mild asymmetric left ventricular hypertrophy of the basal-septal segment. Left ventricular diastolic parameters are consistent with Grade I diastolic dysfunction (impaired relaxation).   2. Right ventricular systolic function is normal. The right ventricular size is mildly enlarged. There is severely elevated pulmonary artery systolic pressure. The estimated right ventricular systolic pressure is  44.0 mmHg.   3. Left atrial size was severely dilated.   4. Right atrial size was mildly dilated.   5. The mitral valve is myxomatous. There is bileaflet prolapse that is more notable in the posterior mitral valve leaflet. There is moderate mitral regurgitation  EROA 0.23 with Rvol 72m.   6. Tricuspid valve regurgitation is moderate to severe.   7. The aortic valve is tricuspid. There is mild calcification of the aortic valve. There is mild thickening of the aortic valve. Aortic valve regurgitation is mild to moderate.   8. Aortic dilatation noted. There is moderate-to-severe dilatation of the ascending aorta, measuring 49 mm. There is mild dilatation of the aortic root, measuring 40 mm.   Comparison(s): Compared to prior TTE in 04/2021, the mitral regurgitation  appears moderate on current study which is slightly less than prior in the  setting of a lower systolic blood pressure. The ascending aortic size  remains stably dilated at 423m (previously 505m    TTE 04/2021: IMPRESSIONS   1. Left ventricular ejection fraction, by estimation, is 60 to 65%. The left ventricle has normal function. The left ventricle has no regional wall motion abnormalities. There is moderate asymmetric hypertrophy of the basal septum. The rest of the LV  segments demonstrate mild left ventricular hypertrophy. Left ventricular diastolic parameters are consistent with Grade I diastolic dysfunction (impaired relaxation).   2. Right ventricular systolic function is normal. The right ventricular size is normal. There is mildly elevated pulmonary artery systolic pressure. The estimated right ventricular systolic pressure is 45.34.7Hg.   3. Left atrial size was severely dilated.   4. The mitral valve is myxomatous. There is moderate holosystolic prolapse of of the mitral valve most notably of the posterior mitral valve leaflet with possible P2 flail. Using a PISA radius of 0.9, there is at least moderate-to-severe mitral  regurgitation with EROA 0.3cm2, RVol 53m71mecommend TEE for further evaluation.   5. Tricuspid valve regurgitation is moderate.   6. The aortic valve is tricuspid. There is mild calcification of the  aortic valve. There is mild thickening of the aortic valve.  Aortic valve regurgitation is mild to moderate. Mild to moderate aortic valve sclerosis/calcification is present, without any evidence of aortic stenosis.   7. Aortic dilatation noted. There is mild dilatation of the aortic root, measuring 39 mm. There is moderate-to-severe dilatation of the ascending aorta, measuring 50 mm.   8. The inferior vena cava is normal in size with greater than 50% respiratory variability, suggesting right atrial pressure of 3 mmHg.   9. Right atrial size was mildly dilated.   Comparison(s): Compared to prior echo, the MR now appears severe. The ascending aorta now measures 50mm32meviously 41mm)57m  2D echo 08/06/2018 Study Conclusions   - Left ventricle: The cavity size was normal. There was severe    focal basal hypertrophy. Systolic function was normal. The    estimated ejection fraction was in the range of 55% to 60%.   Wall motion was normal; there were no regional wall motion    abnormalities. There was an increased relative contribution of    atrial contraction to ventricular filling. Doppler parameters are    consistent with abnormal left ventricular relaxation (grade 1    diastolic dysfunction). Doppler parameters are consistent with    high ventricular filling pressure.  - Aortic valve: Trileaflet; mildly thickened, mildly calcified    leaflets. There was mild regurgitation.  - Aorta: Ascending aorta diameter: 41 mm (ED).  - Ascending aorta: The ascending aorta was mildly dilated.  - Mitral valve: Mild diffuse thickening, consistent with myxomatous    proliferation. Moderate, holosystolic prolapse, involving the    posterior > anterior leaflet. There was moderate regurgitation.    Effective regurgitant orifice (PISA): 0.15 cm^2. Regurgitant    volume (PISA): 32 ml.  - Left atrium: The atrium was moderately to severely dilated.  - Tricuspid valve: There was moderate regurgitation.  - Pulmonary arteries: PA peak pressure: 45 mm Hg (S).   Impressions:    - The right ventricular systolic pressure was increased consistent    with moderate pulmonary hypertension.   Myoview 01/2017: The nuclear study is very poor quality due to body habitus Defect 1: There is a defect present in the basal inferolateral and mid inferolateral location that is most likely artifact due to uptake in structures below the diaphragm. This is a low risk study. Nuclear stress EF: 72%. The left ventricular ejection fraction is hyperdynamic (>65%).  EKG:  EKG is not ordered today.   Recent Labs: 11/23/2021: Magnesium 2.3 06/11/2022: ALT 18; Hemoglobin 13.4; Platelets 167.0; TSH 0.34 08/21/2022: BUN 25; Creatinine, Ser 0.69; Potassium 4.1; Sodium 137  Recent Lipid Panel    Component Value Date/Time   CHOL  12/02/2010 0400    149        ATP III CLASSIFICATION:  <200     mg/dL   Desirable  200-239  mg/dL   Borderline High  >=240    mg/dL   High          TRIG 54 12/02/2010 0400   HDL 58 12/02/2010 0400   CHOLHDL 2.6 12/02/2010 0400   VLDL 11 12/02/2010 0400   LDLCALC  12/02/2010 0400    80        Total Cholesterol/HDL:CHD Risk Coronary Heart Disease Risk Table                     Men   Women  1/2 Average Risk   3.4   3.3  Average Risk       5.0   4.4  2 X Average Risk   9.6   7.1  3 X Average Risk  23.4   11.0        Use the calculated Patient Ratio above and the CHD Risk Table to determine the patient's CHD Risk.        ATP III CLASSIFICATION (LDL):  <100     mg/dL   Optimal  100-129  mg/dL   Near or Above                    Optimal  130-159  mg/dL   Borderline  160-189  mg/dL   High  >190     mg/dL   Very High    Risk Assessment/Calculations:   CHA2DS2-VASc Score = 6   This indicates a 9.7% annual risk of stroke. The patient's score is based upon: CHF History: 1 HTN History: 1 Diabetes History: 0 Stroke History: 0 Vascular Disease History: 1 Age Score: 2 Gender Score: 1     Home Medications   Current Meds  Medication Sig    apixaban (ELIQUIS) 2.5 MG TABS tablet Take 1 tablet (2.5 mg total) by mouth 2 (two) times daily.   Ascorbic Acid (VITA-C PO) Take by mouth.   calcium citrate-vitamin D (CITRACAL+D) 315-200 MG-UNIT per tablet Take 1 tablet by mouth 2 (two) times daily.   cholecalciferol (VITAMIN D) 1000 UNITS tablet Take 1,000 Units by mouth daily with lunch. Vitamin D3   diltiazem (CARDIZEM CD) 180 MG 24 hr capsule Take 1 capsule daily   Docusate Calcium (STOOL SOFTENER PO) Take by mouth.   estradiol (ESTRACE) 0.1 MG/GM vaginal cream Place 1 Applicatorful vaginally once a week.   HYDROcodone-acetaminophen (NORCO/VICODIN) 5-325 MG tablet Take 1-2 tablets by mouth every 4 (four) hours as needed (mild pain).   iron polysaccharides (NIFEREX) 150 MG capsule Take 150 mg by mouth 3 (three) times a week.    levothyroxine (SYNTHROID, LEVOTHROID) 125 MCG tablet Take 125 mcg by mouth daily before breakfast.   losartan (COZAAR) 25 MG tablet Take 1 tablet (25 mg total) by mouth daily.   Magnesium 200 MG TABS Take 1 tablet (200 mg total) by mouth daily.   Meclizine HCl (BONINE PO) Take by mouth as needed.   Multiple Vitamin (MULTIVITAMIN) tablet Take 1 tablet by mouth daily with breakfast.   omeprazole (PRILOSEC OTC) 20 MG tablet Take 20 mg by mouth daily before breakfast.    potassium chloride (KLOR-CON M) 10 MEQ tablet Take 1 tablet (10 mEq total) by mouth daily on Mon, Wed, Fri, and Sundays, with lasix administration.   pravastatin (PRAVACHOL) 20 MG tablet Take 20 mg by mouth daily with supper.    risedronate (ACTONEL) 150 MG tablet Take 150 mg by mouth every 30 (thirty) days. with water on empty stomach, nothing by mouth or lie down for next 30 minutes.   solifenacin (VESICARE) 5 MG tablet Take 5 mg by mouth daily with breakfast.    TART CHERRY PO Take by mouth.   [DISCONTINUED] furosemide (LASIX) 20 MG tablet Take one tablet by mouth on Monday, Wed, Fridays, and Sunday     Review of Systems      All other systems  reviewed and are otherwise negative except as noted above.  Physical Exam    VS:  BP 128/80   Pulse 82   Ht '4\' 11"'$  (1.499 m)   Wt 102 lb (46.3 kg)   SpO2 97%   BMI 20.60 kg/m  , BMI Body mass index is 20.6 kg/m.  Wt Readings from Last 3 Encounters:  10/16/22 102 lb (46.3 kg)  08/21/22 102 lb 9.6 oz (46.5 kg)  07/23/22 101 lb (45.8 kg)     GEN: Thin and frail elderly lady HEENT: normal. Neck: Supple, no JVD, carotid bruits, or masses. Cardiac: RRR, + systolic murmur, rubs, or gallops. No clubbing, cyanosis, edema.  Radials/PT 2+ and equal bilaterally.  Respiratory:  Respirations regular and unlabored, clear to auscultation bilaterally. GI: Soft, nontender, nondistended. MS: No deformity or atrophy. Skin: Warm and dry, no rash. Neuro:  Strength and sensation are intact. Psych: Normal affect.  Assessment & Plan    Dizziness/Lightheadedness -it happens all the time and not much makes it better or worse -BP stable today -discussed proper nutrition and hydration status -asked her to decrease diuretics to three days a week -CMP and CBC ordered today -most recent echo from 7/23 reviewed with the patient -would recommend further workup through PCP with no cardiac cause for symptoms identified  Chronic diastolic CHF/severe MR and severe TR -not a surgical candidate -continue diuretics -appears euvolemic on exam today -she is still experiencing some dizziness, so we have asked her to take her lasix 3 days a week instead of 4 and weight herself daily as well as take daily BP  Hypertension -383 or > 338 systolic we would need a phone call -well controlled today -continue current medications -discussed proper hydration and caloric intake  PAF -continue Eliquis, no issues with bleeding to her knowledge -with lightheadedness will get CBC today  Aneurysm of ascending aorta without rupture -measured 26m on recent echo -continue tight BP control -not a surgical candidate for  repair, already evaluated by [facility]  Unsteady gait -uses a walker for balance everywhere she goes -recent fall a few days ago but did not hit her head -chronic pain with right shoulder pain today, CXR reviewed -recommended PCP to refer to ortho for further workup with MRI if indicated.   Chronic bilateral thoracic back pain -Treated with hydrocodone-whole one in the morning and half at night -stable at this time          Disposition: Follow up 2-3 months with HFreada Bergeron MD or APP.  Signed, TElgie Collard PA-C 10/16/2022, 9:57 AM [facility]

## 2022-10-15 NOTE — Telephone Encounter (Signed)
Call sent straight to triage. Patient is complaining of right chest pressure around her ribs and SOB due to pain. Patient is not sure if anything is going on with her heart. Informed her that most likely not cardiac related, but with her symptoms she should be evaluated. Encouraged patient to go to ED, patient refuses to go to the ED and wants to be seen in our office. Made patient first available appointment with PA tomorrow at 8:50 am. Patient stated she has already seen her PCP and had a chest xray. Informed patient that her chest xray looked good from a lung and heart prospective. Patient stated she was worried about pneumonia, informed her that her chest xray showed her lungs were clear. Patient stated the chest xray was for routine assessment  of her ribs. Patient stated they were not looking at her lungs on the chest xray. Did not want to get in a debate with patient and patient is very argumentative. Encouraged patient if her symptoms get worse to go to the ED. Informed her that a message would be sent to Dr. Johney Frame and her nurse. Patient stated she wants to hear back from them today.

## 2022-10-15 NOTE — Telephone Encounter (Signed)
Patient states she has been hurting  went to medical doctor and x ray did not show anything.   Pt c/o of Chest Pain: STAT if CP now or developed within 24 hours  1. Are you having CP right now? yes  2. Are you experiencing any other symptoms (ex. SOB, nausea, vomiting, sweating)? Sob, lightheaded  3. How long have you been experiencing CP? Since Sunday a week ago  4. Is your CP continuous or coming and going? continuous  5. Have you taken Nitroglycerin? no  Patient states she has been having pain in her chest and ribs. She says she also had a fall recently, but has had the pains since before her fall. She says she went to her PCP and they did an x ray, but it didn't show anything. She says she has also had lightheadedness, but has not passed out. She says

## 2022-10-16 ENCOUNTER — Encounter: Payer: Self-pay | Admitting: Physician Assistant

## 2022-10-16 ENCOUNTER — Ambulatory Visit: Payer: Medicare Other | Attending: Physician Assistant | Admitting: Physician Assistant

## 2022-10-16 VITALS — BP 128/80 | HR 82 | Ht 59.0 in | Wt 102.0 lb

## 2022-10-16 DIAGNOSIS — M546 Pain in thoracic spine: Secondary | ICD-10-CM | POA: Insufficient documentation

## 2022-10-16 DIAGNOSIS — I480 Paroxysmal atrial fibrillation: Secondary | ICD-10-CM | POA: Diagnosis not present

## 2022-10-16 DIAGNOSIS — I340 Nonrheumatic mitral (valve) insufficiency: Secondary | ICD-10-CM | POA: Diagnosis not present

## 2022-10-16 DIAGNOSIS — G8929 Other chronic pain: Secondary | ICD-10-CM | POA: Diagnosis not present

## 2022-10-16 DIAGNOSIS — Z79899 Other long term (current) drug therapy: Secondary | ICD-10-CM | POA: Insufficient documentation

## 2022-10-16 DIAGNOSIS — R2681 Unsteadiness on feet: Secondary | ICD-10-CM | POA: Diagnosis not present

## 2022-10-16 DIAGNOSIS — I7121 Aneurysm of the ascending aorta, without rupture: Secondary | ICD-10-CM | POA: Insufficient documentation

## 2022-10-16 DIAGNOSIS — I5032 Chronic diastolic (congestive) heart failure: Secondary | ICD-10-CM | POA: Diagnosis not present

## 2022-10-16 MED ORDER — FUROSEMIDE 20 MG PO TABS: ORAL_TABLET | ORAL | 3 refills | Status: DC

## 2022-10-16 NOTE — Patient Instructions (Signed)
Medication Instructions:  1.Decrease lasix to 20 mg, three times a week *If you need a refill on your cardiac medications before your next appointment, please call your pharmacy*   Lab Work: CBC and CMP today If you have labs (blood work) drawn today and your tests are completely normal, you will receive your results only by: [place] (if you have MyChart) OR A paper copy in the mail If you have any lab test that is abnormal or we need to change your treatment, we will call you to review the results.   Follow-Up: At [facility], you and your health needs are our priority.  As part of our continuing mission to provide you with exceptional heart care, we have created designated Provider Care Teams.  These Care Teams include your primary Cardiologist (physician) and Advanced Practice Providers (APPs -  Physician Assistants and Nurse Practitioners) who all work together to provide you with the care you need, when you need it.  We recommend signing up for the patient portal called "MyChart".  Sign up information is provided on this After Visit Summary.  MyChart is used to connect with patients for Virtual Visits (Telemedicine).  Patients are able to view lab/test results, encounter notes, upcoming appointments, etc.  Non-urgent messages can be sent to your provider as well.   To learn more about what you can do with MyChart, go to [email or web address].    Your next appointment:   2-3 month(s)  The format for your next appointment:   In Person  Provider:   Freada Bergeron, MD    Other Instructions 1.Check your blood pressure daily, one hour after taking your morning medications and keep a log of your readings. 2.Have your primary care provider send you for an Ortho consult at [facility]  Important Information About Sugar

## 2022-10-17 LAB — CBC
Hematocrit: 41.8 % (ref 34.0–46.6)
Hemoglobin: 14.1 g/dL (ref 11.1–15.9)
MCH: 30.5 pg (ref 26.6–33.0)
MCHC: 33.7 g/dL (ref 31.5–35.7)
MCV: 91 fL (ref 79–97)
Platelets: 160 10*3/uL (ref 150–450)
RBC: 4.62 x10E6/uL (ref 3.77–5.28)
RDW: 13.6 % (ref 11.7–15.4)
WBC: 9 10*3/uL (ref 3.4–10.8)

## 2022-10-17 LAB — COMPREHENSIVE METABOLIC PANEL
ALT: 20 IU/L (ref 0–32)
AST: 24 IU/L (ref 0–40)
Albumin/Globulin Ratio: 2.1 (ref 1.2–2.2)
Albumin: 4.4 g/dL (ref 3.7–4.7)
Alkaline Phosphatase: 57 IU/L (ref 44–121)
BUN/Creatinine Ratio: 32 — ABNORMAL HIGH (ref 12–28)
BUN: 25 mg/dL (ref 8–27)
Bilirubin Total: 0.4 mg/dL (ref 0.0–1.2)
CO2: 29 mmol/L (ref 20–29)
Calcium: 9.9 mg/dL (ref 8.7–10.3)
Chloride: 96 mmol/L (ref 96–106)
Creatinine, Ser: 0.78 mg/dL (ref 0.57–1.00)
Globulin, Total: 2.1 g/dL (ref 1.5–4.5)
Glucose: 104 mg/dL — ABNORMAL HIGH (ref 70–99)
Potassium: 4 mmol/L (ref 3.5–5.2)
Sodium: 137 mmol/L (ref 134–144)
Total Protein: 6.5 g/dL (ref 6.0–8.5)
eGFR: 75 mL/min/{1.73_m2} (ref >59)

## 2022-10-17 NOTE — Telephone Encounter (Signed)
Pt saw Nicholes Rough PA-C in the office yesterday for complaints.  See OV note from yesterday for further details.

## 2022-10-17 NOTE — Progress Notes (Signed)
[place] Phone: [phone] Subjective:   [name], am serving as a scribe for Dr. Hulan Saas.  I'm seeing this patient by the request  of:  Corliss Blacker, MD  CC: low back pain   [id number]  07/23/2022 Discussed again at great length with patient. Continues to have body aches and muscle aches. We discussed with patient about different treatment options. Patient still declines anything such as Cymbalta, Prolia for the osteoporosis, for physical therapy. Patient does not want to undergo any more epidurals which will be challenging with patient also on Eliquis now. Patient is on hydrocodone. We discussed with her though that I feel that some of his other medications could be more beneficial. Patient does not want to make any changes. At this point patient can follow-up again in 3 months or as needed. Total time discussing with patient 32 minutes   Update 10/23/2022 [name] is a 86 y.o. female coming in with complaint of LBP. Patient states that she is about the same as last visit. Patient has fallen since last visit and saw her PCP. Continued constant pain in R ribs. Xray on 1129/2023.       Past Medical History:  Diagnosis Date   Anxiety    Aortic regurgitation    Breast cancer ([place]) 08/20/2011   R breast DCIS, ER/PR +   Cataract 3 and 10/92   bilateral   Chronic diastolic CHF (congestive heart failure) (HCC)    Compression fracture of fourth lumbar vertebra (HCC)    DVT (deep venous thrombosis) ([place]) 08/2011   LL extremity    Fibromyalgia    Fracture lumbar vertebra-closed (HCC)    Fracture of thoracic vertebra, closed (HCC)    GERD (gastroesophageal reflux disease)    H/O hiatal hernia    History of blood clots    History of radiation therapy 01/2012   R breast   Hypercholesteremia    Hypertension    DR Orinda Kenner   Hypothyroidism    Mitral regurgitation    Mitral valve  prolapse    Osteoporosis    PAF (paroxysmal atrial fibrillation) ([place])    a. dx 11/2016.   Pulmonary hypertension ([place])    Rib fractures    Thoracic ascending aortic aneurysm ([place])    a. followed by Dr. Prescott Gum.   Tricuspid regurgitation    Past Surgical History:  Procedure Laterality Date   ANTERIOR CERVICAL DECOMP/DISCECTOMY FUSION N/A 09/09/2015   Procedure: Anterior Cervical Decompression and Fusion Cervical seven-Thorasic one ;  Surgeon: Erline Levine, MD;  Location: [facility];  Service: Neurosurgery;  Laterality: N/A;   [place]  10/16/2006   RIH - Dr Hassell Done   KYPHOPLASTY N/A 01/26/2013   Procedure: KYPHOPLASTY;  Surgeon: Kristeen Miss, MD;  Location: [facility];  Service: Neurosurgery;  Laterality: N/A;  T11 and L1   KYPHOPLASTY N/A 06/21/2017   Procedure: Lumbar four Kyphoplasty;  Surgeon: Erline Levine, MD;  Location: [facility];  Service: Neurosurgery;  Laterality: N/A;   MASTECTOMY, PARTIAL  10/17/2011   Procedure: MASTECTOMY PARTIAL;  Surgeon: Haywood Lasso, MD;  Location: [facility];  Service: General;  Laterality: Right;  needle guided   TEE WITHOUT CARDIOVERSION N/A 12/04/2016   Procedure: TRANSESOPHAGEAL ECHOCARDIOGRAM (TEE);  Surgeon: Pixie Casino, MD;  Location: [facility];  Service: Cardiovascular;  Laterality: N/A;   TONSILLECTOMY  1944   Social History   Socioeconomic History   Marital status: Married    Spouse name: Not on file   Number of children: Not on file   Years of education: Not on file   Highest education level: Not on file  Occupational History   Not on file  Tobacco Use   Smoking status: Never   Smokeless tobacco: Never  Vaping Use   Vaping Use: Never used  Substance and Sexual Activity   Alcohol use: No   Drug use: No   Sexual activity: Never  Other Topics Concern   Not on file  Social History Narrative   Not on file   Social  Determinants of Health   Financial Resource Strain: Not on file  Food Insecurity: Not on file  Transportation Needs: Not on file  Physical Activity: Not on file  Stress: Not on file  Social Connections: Not on file   Allergies  Allergen Reactions   Contrast Media [Iodinated Contrast Media] Shortness Of Breath   Iohexol Shortness Of Breath   Mucinex [Guaifenesin Er] Other (See Comments)    DIZZINESS   Zanaflex [Tizanidine Hcl] Anxiety and Other (See Comments)    Dizziness   Penicillins Rash    Has patient had a PCN reaction causing immediate rash, facial/tongue/throat swelling, SOB or lightheadedness with hypotension: No Has patient had a PCN reaction causing severe rash involving mucus membranes or skin necrosis: No Has patient had a PCN reaction that required hospitalization No Has patient had a PCN reaction occurring within the last 10 years: No If all of the above answers are "NO", then may proceed with Cephalosporin use.    Robaxin [Methocarbamol] Diarrhea   Family History  Problem Relation Age of Onset   Heart disease Mother    Heart disease Maternal Uncle    Heart disease Maternal Grandfather     Current Outpatient Medications (Endocrine & Metabolic):    levothyroxine (SYNTHROID, LEVOTHROID) 125 MCG tablet, Take 125 mcg by mouth daily before breakfast.   risedronate (ACTONEL) 150 MG tablet, Take 150 mg by mouth every 30 (thirty) days. with water on empty stomach, nothing by mouth or lie down for next 30 minutes.  Current Outpatient Medications (Cardiovascular):    diltiazem (CARDIZEM CD) 180 MG 24 hr capsule, Take 1 capsule daily   furosemide (LASIX) 20 MG tablet, Take one tablet by mouth three times a week   losartan (COZAAR) 25 MG tablet, Take 1 tablet (25 mg total) by mouth daily.   pravastatin (PRAVACHOL) 20 MG tablet, Take 20 mg by mouth daily with supper.    Current Outpatient Medications (Analgesics):    HYDROcodone-acetaminophen (NORCO/VICODIN) 5-325 MG  tablet, Take 1-2 tablets by mouth every 4 (four) hours as needed (mild pain).  Current Outpatient Medications (Hematological):    apixaban (ELIQUIS) 2.5 MG TABS tablet, Take 1 tablet (2.5 mg total) by mouth 2 (two) times daily.   iron polysaccharides (NIFEREX) 150 MG capsule, Take 150 mg by mouth 3 (three) times a week.   Current Outpatient Medications (Other):    Ascorbic Acid (VITA-C PO), Take by mouth.   calcium citrate-vitamin D (CITRACAL+D) 315-200 MG-UNIT per tablet, Take 1 tablet by mouth 2 (two) times daily.   cholecalciferol (VITAMIN D) 1000 UNITS tablet, Take 1,000 Units by mouth daily with lunch. Vitamin D3   Docusate Calcium (STOOL SOFTENER PO), Take by mouth.   estradiol (ESTRACE) 0.1 MG/GM vaginal cream,  Place 1 Applicatorful vaginally once a week.   Magnesium 200 MG TABS, Take 1 tablet (200 mg total) by mouth daily.   Meclizine HCl (BONINE PO), Take by mouth as needed.   Multiple Vitamin (MULTIVITAMIN) tablet, Take 1 tablet by mouth daily with breakfast.   omeprazole (PRILOSEC OTC) 20 MG tablet, Take 20 mg by mouth daily before breakfast.    potassium chloride (KLOR-CON M) 10 MEQ tablet, Take 1 tablet (10 mEq total) by mouth daily on Mon, Wed, Fri, and Sundays, with lasix administration.   solifenacin (VESICARE) 5 MG tablet, Take 5 mg by mouth daily with breakfast.    TART CHERRY PO, Take by mouth.   Reviewed prior external information including notes and imaging from  primary care provider As well as notes that were available from care everywhere and other healthcare systems.  Past medical history, social, surgical and family history all reviewed in electronic medical record.  No pertanent information unless stated regarding to the chief complaint.   Review of Systems:  No headache, visual changes, nausea, vomiting, diarrhea, constipation, dizziness, abdominal pain, skin rash, fevers, chills, night sweats, weight loss, swollen lymph nodes,chest pain, shortness of breath,  mood changes. POSITIVE muscle aches, body aches, joint swelling  Objective  Blood pressure (!) 142/84, pulse 85, height '4\' 11"'$  (1.499 m), weight 103 lb (46.7 kg), SpO2 99 %.   General: No apparent distress alert and oriented x3 mood and affect normal, dressed appropriately.  HEENT: Pupils equal, extraocular movements intact  Respiratory: Patient's speak in full sentences and does not appear short of breath  Cardiovascular: No lower extremity edema, non tender, no erythema  Patient has severe scoliosis noted.  Patient does appear does look fragile.  Patient does have significant tightness with multiple trigger points in the right shoulder blade.  Patient's neck does have some crepitus noted as well. \Low back exam does have some significant tightness as well as still scoliosis.  After verbal consent patient was prepped with alcohol swab and with a 25-gauge half inch needle injected in just the superficial aspect of the right shoulder and multiple muscles including the trapezius and the rhomboid.  Total of 3 cc of 0.5% Marcaine and 1 cc of Kenalog 40 mg/mL used.  Minimal blood loss.  No Band-Aids placed secondary to patient's skin.  Postinjection instructions given.      Impression and Recommendations:    The above documentation has been reviewed and is accurate and complete Lyndal Pulley, DO

## 2022-10-23 ENCOUNTER — Ambulatory Visit (INDEPENDENT_AMBULATORY_CARE_PROVIDER_SITE_OTHER): Payer: Medicare Other | Admitting: Family Medicine

## 2022-10-23 VITALS — BP 142/84 | HR 85 | Ht 59.0 in | Wt 103.0 lb

## 2022-10-23 DIAGNOSIS — M25511 Pain in right shoulder: Secondary | ICD-10-CM

## 2022-10-23 NOTE — Patient Instructions (Signed)
Referral to neurology-neuropathy Read about Cymbalta-Call if you want to try medication See me in 2 months

## 2022-10-23 NOTE — Assessment & Plan Note (Signed)
Patient given injections today and tolerated the procedure well.  I still feel the underlying muscle imbalance, scoliosis, arthritic changes, as well as osteoporosis is causing more of the chronic pain.  We discussed medications including the Cymbalta as well as osteoporosis medicine such as Prolia which patient will not do.  Discussed with her at this moment I do not know if I have anything else that would be extremely helpful for her then.  Patient wanted to see neurology secondary to the tingling still in her hands and feet.  We discussed with her that I do not know for another significant workup would be helpful but will refer her so she can discuss.  We discussed with her otherwise we need to consider other things such as a spine stimulator.  Follow-up with me again in 2 to 3 months.  Total time with patient 47 minutes

## 2022-10-24 ENCOUNTER — Ambulatory Visit: Payer: Medicare Other | Admitting: Diagnostic Neuroimaging

## 2022-10-25 ENCOUNTER — Telehealth: Payer: Self-pay | Admitting: Family Medicine

## 2022-10-25 ENCOUNTER — Ambulatory Visit: Payer: Medicare Other | Admitting: Podiatry

## 2022-10-25 NOTE — Telephone Encounter (Signed)
Pt had injection 12/12, helped with pain in that area BUT pt has been experiencing neuropathy like symptoms (pins and needles/burning sensation) all over her body, primarily in her legs and feet.  Pt looking for advice.

## 2022-10-26 ENCOUNTER — Other Ambulatory Visit: Payer: Self-pay

## 2022-10-26 ENCOUNTER — Encounter: Payer: Self-pay | Admitting: Neurology

## 2022-10-26 DIAGNOSIS — G629 Polyneuropathy, unspecified: Secondary | ICD-10-CM

## 2022-10-26 NOTE — Telephone Encounter (Signed)
Spoke with patient who is feeling better from injection. Neurology referral placed and patient given number for office in case they do not call in next couple of days. Also told to go into ED if symptoms worsen over the weekend. Patient voices understanding.

## 2022-10-26 NOTE — Telephone Encounter (Signed)
Left message for patient to call back for recommendations.  

## 2022-10-30 ENCOUNTER — Other Ambulatory Visit: Payer: Self-pay

## 2022-10-30 DIAGNOSIS — I480 Paroxysmal atrial fibrillation: Secondary | ICD-10-CM

## 2022-10-30 MED ORDER — APIXABAN 2.5 MG PO TABS: 2.5000 mg | ORAL_TABLET | Freq: Two times a day (BID) | ORAL | 5 refills | Status: DC

## 2022-10-30 NOTE — Telephone Encounter (Signed)
Prescription refill request for Eliquis received. Indication:afib Last office visit:12/23 Scr:0.7 Age: 86 Weight:46.7 kg  Prescription refilled

## 2022-11-15 ENCOUNTER — Other Ambulatory Visit: Payer: Self-pay | Admitting: Physician Assistant

## 2022-11-15 ENCOUNTER — Ambulatory Visit
Admission: RE | Admit: 2022-11-15 | Discharge: 2022-11-15 | Disposition: A | Discharge status: Home / Self Care | Payer: Medicare Other | Source: Ambulatory Visit | Attending: Physician Assistant | Admitting: Physician Assistant

## 2022-11-15 DIAGNOSIS — R000 Tachycardia, unspecified: Secondary | ICD-10-CM | POA: Diagnosis not present

## 2022-11-15 DIAGNOSIS — R1032 Left lower quadrant pain: Secondary | ICD-10-CM

## 2022-11-15 DIAGNOSIS — R109 Unspecified abdominal pain: Secondary | ICD-10-CM | POA: Diagnosis not present

## 2022-11-17 ENCOUNTER — Other Ambulatory Visit: Payer: Self-pay

## 2022-11-17 ENCOUNTER — Emergency Department (HOSPITAL_COMMUNITY)
Admission: EM | Admit: 2022-11-17 | Discharge: 2022-11-17 | Disposition: A | Discharge status: Home / Self Care | Payer: Medicare Other | Attending: Emergency Medicine | Admitting: Emergency Medicine

## 2022-11-17 ENCOUNTER — Emergency Department (HOSPITAL_COMMUNITY): Payer: Medicare Other

## 2022-11-17 DIAGNOSIS — N3000 Acute cystitis without hematuria: Secondary | ICD-10-CM | POA: Diagnosis not present

## 2022-11-17 DIAGNOSIS — R109 Unspecified abdominal pain: Secondary | ICD-10-CM

## 2022-11-17 DIAGNOSIS — Z7901 Long term (current) use of anticoagulants: Secondary | ICD-10-CM | POA: Insufficient documentation

## 2022-11-17 DIAGNOSIS — K5900 Constipation, unspecified: Secondary | ICD-10-CM

## 2022-11-17 DIAGNOSIS — I700 Atherosclerosis of aorta: Secondary | ICD-10-CM | POA: Diagnosis not present

## 2022-11-17 LAB — CBC WITH DIFFERENTIAL/PLATELET
Abs Immature Granulocytes: 0.02 10*3/uL (ref 0.00–0.07)
Basophils Absolute: 0 10*3/uL (ref 0.0–0.1)
Basophils Relative: 0 %
Eosinophils Absolute: 0.1 10*3/uL (ref 0.0–0.5)
Eosinophils Relative: 1 %
HCT: 34.9 % — ABNORMAL LOW (ref 36.0–46.0)
Hemoglobin: 12.4 g/dL (ref 12.0–15.0)
Immature Granulocytes: 0 %
Lymphocytes Relative: 14 %
Lymphs Abs: 1.3 10*3/uL (ref 0.7–4.0)
MCH: 30.5 pg (ref 26.0–34.0)
MCHC: 35.5 g/dL (ref 30.0–36.0)
MCV: 86 fL (ref 80.0–100.0)
Monocytes Absolute: 0.9 10*3/uL (ref 0.1–1.0)
Monocytes Relative: 10 %
Neutro Abs: 7.1 10*3/uL (ref 1.7–7.7)
Neutrophils Relative %: 75 %
Platelets: 153 10*3/uL (ref 150–400)
RBC: 4.06 MIL/uL (ref 3.87–5.11)
RDW: 13.6 % (ref 11.5–15.5)
WBC: 9.4 10*3/uL (ref 4.0–10.5)
nRBC: 0 % (ref 0.0–0.2)

## 2022-11-17 LAB — COMPREHENSIVE METABOLIC PANEL
ALT: 21 U/L (ref 0–44)
AST: 29 U/L (ref 15–41)
Albumin: 3.4 g/dL — ABNORMAL LOW (ref 3.5–5.0)
Alkaline Phosphatase: 46 U/L (ref 38–126)
Anion gap: 11 (ref 5–15)
BUN: 31 mg/dL — ABNORMAL HIGH (ref 8–23)
CO2: 24 mmol/L (ref 22–32)
Calcium: 9.1 mg/dL (ref 8.9–10.3)
Chloride: 97 mmol/L — ABNORMAL LOW (ref 98–111)
Creatinine, Ser: 0.74 mg/dL (ref 0.44–1.00)
GFR, Estimated: >60 mL/min (ref >60)
Glucose, Bld: 104 mg/dL — ABNORMAL HIGH (ref 70–99)
Potassium: 3.8 mmol/L (ref 3.5–5.1)
Sodium: 132 mmol/L — ABNORMAL LOW (ref 135–145)
Total Bilirubin: 0.8 mg/dL (ref 0.3–1.2)
Total Protein: 5.8 g/dL — ABNORMAL LOW (ref 6.5–8.1)

## 2022-11-17 LAB — URINALYSIS, ROUTINE W REFLEX MICROSCOPIC
Bilirubin Urine: NEGATIVE
Glucose, UA: NEGATIVE mg/dL
Hgb urine dipstick: NEGATIVE
Ketones, ur: 5 mg/dL — AB
Nitrite: NEGATIVE
Protein, ur: NEGATIVE mg/dL
Specific Gravity, Urine: 1.02 (ref 1.005–1.030)
pH: 5 (ref 5.0–8.0)

## 2022-11-17 LAB — POC OCCULT BLOOD, ED: Fecal Occult Bld: NEGATIVE

## 2022-11-17 LAB — LIPASE, BLOOD: Lipase: 32 U/L (ref 11–51)

## 2022-11-17 MED ORDER — BARIUM SULFATE 2 % PO SUSP
450.0000 mL | Freq: Once | ORAL | Status: AC
  Administered 2022-11-17: 450 mL via ORAL

## 2022-11-17 MED ORDER — SODIUM CHLORIDE 0.9 % IV BOLUS (SEPSIS)
500.0000 mL | Freq: Once | INTRAVENOUS | Status: AC
  Administered 2022-11-17: 500 mL via INTRAVENOUS

## 2022-11-17 MED ORDER — FENTANYL CITRATE PF 50 MCG/ML IJ SOSY: 50.0000 ug | PREFILLED_SYRINGE | Freq: Once | INTRAMUSCULAR | Status: DC | PRN

## 2022-11-17 MED ORDER — SODIUM CHLORIDE 0.9 % IV SOLN
1000.0000 mL | INTRAVENOUS | Status: DC
  Administered 2022-11-17: 1000 mL via INTRAVENOUS

## 2022-11-17 NOTE — ED Notes (Signed)
Placed a perwick  and a brief patient is resting with family at bedside and call bell in reach

## 2022-11-17 NOTE — ED Provider Notes (Signed)
Care transferred to me. CT he images personally viewed/interpreted and there is no obvious obstruction or diverticulitis.  There is constipation.  At this point, patient then felt like she had to go the bathroom and had a large bowel movement in the toilet.  She still has some on and off pain but feels well enough to go home.  She has met such as Senokot and MiraLAX at home and is on a stool softener.  She would like to take these meds at home rather than have a new prescription.  Knee is pretty reasonable.  She does have a UTI on urinalysis but just started on antibiotics I do not think she needs a change or IV antibiotics.  Otherwise, will discharge home to follow-up with PCP.   Sherwood Gambler, MD 11/17/22 [phone]

## 2022-11-17 NOTE — ED Provider Notes (Signed)
[facility] EMERGENCY DEPARTMENT Provider Note   CSN: [id number] Arrival date & time: 11/17/22  1315     History  Chief Complaint  Patient presents with   bowel obstruction   Abdominal Pain    [name] is a 87 y.o. female.   Abdominal Pain    Patient states she started having abdominal pain since earlier in this week.  Patient states she has been having sharp pain more so in her upper abdomen but some in her lower abdomen.  She is on chronic pain meds, twice daily so she takes a stool softener to help prevent constipation.  Patient has not felt constipated denies any nausea or vomiting.  She went to see her primary care doctor to evaluate her pain.  She had outpatient abdominal films yesterday.  It did not show any evidence of obstruction but showed a large stool ball in the rectum measuring 12 cm.  Patient states she was sent to the ED for further evaluation.  Home Medications Prior to Admission medications   Medication Sig Start Date End Date Taking? Authorizing Provider  apixaban (ELIQUIS) 2.5 MG TABS tablet Take 1 tablet (2.5 mg total) by mouth 2 (two) times daily. 10/30/22  Yes Freada Bergeron, MD  Ascorbic Acid (VITA-C PO) Take 1,000 mg by mouth daily.   Yes [provider]  calcium citrate-vitamin D (CITRACAL+D) 315-200 MG-UNIT per tablet Take 1 tablet by mouth 2 (two) times daily. Patient taking differently: Take 2 tablets by mouth daily. 01/28/13  Yes [name], MD  cephALEXin (KEFLEX) 500 MG capsule Take 500 mg by mouth 3 (three) times daily. 11/16/22  Yes [provider]  cholecalciferol (VITAMIN D) 1000 UNITS tablet Take 2,000 Units by mouth every evening. Vitamin D3   Yes [provider]  diltiazem (CARDIZEM CD) 180 MG 24 hr capsule Take 1 capsule daily 03/27/22  Yes Pemberton, Greer Ee, MD  Docusate Calcium (STOOL SOFTENER PO) Take 100 mg by mouth at bedtime.   Yes [provider]  estradiol  (ESTRACE) 0.1 MG/GM vaginal cream Place 1 Applicatorful vaginally once a week.   Yes [provider]  furosemide (LASIX) 20 MG tablet Take one tablet by mouth three times a week Patient taking differently: Take 20 mg by mouth 3 (three) times a week. Take one tablet by mouth three times a week Mon, wed, Friday 10/16/22  Yes Conte, Tessa N, PA-C  HYDROcodone-acetaminophen (NORCO/VICODIN) 5-325 MG tablet Take 1-2 tablets by mouth every 4 (four) hours as needed (mild pain). 09/12/15  Yes Reyne Dumas, MD  iron polysaccharides (NIFEREX) 150 MG capsule Take 150 mg by mouth 3 (three) times a week. M, W, F   Yes [provider]  levothyroxine (SYNTHROID, LEVOTHROID) 125 MCG tablet Take 125 mcg by mouth daily before breakfast.   Yes [provider]  losartan (COZAAR) 25 MG tablet Take 1 tablet (25 mg total) by mouth daily. 01/02/22  Yes Freada Bergeron, MD  Meclizine HCl (BONINE PO) Take 12.5 mg by mouth daily as needed (dizziness).   Yes [provider]  Multiple Vitamin (MULTIVITAMIN) tablet Take 1 tablet by mouth daily with breakfast.   Yes [provider]  omeprazole (PRILOSEC OTC) 20 MG tablet Take 20 mg by mouth daily before breakfast.    Yes [provider]  OVER THE COUNTER MEDICATION Take 1,000 mg by mouth daily. D-Mannose 500 mg   Yes [provider]  potassium chloride (KLOR-CON M) 10 MEQ  tablet Take 1 tablet (10 mEq total) by mouth daily on Mon, Wed, Fri, and Sundays, with lasix administration. Patient taking differently: Take 10 mEq by mouth 3 (three) times a week. Take 1 tablet (10 mEq total) by mouth daily on Mon, Wed, Fri,  with lasix administration. 08/21/22  Yes Freada Bergeron, MD  pravastatin (PRAVACHOL) 20 MG tablet Take 20 mg by mouth daily with supper.  09/02/11  Yes [provider]  risedronate (ACTONEL) 150 MG tablet Take 150 mg by mouth every 30 (thirty) days. with water on empty stomach, nothing by mouth  or lie down for next 30 minutes.   Yes [provider]  solifenacin (VESICARE) 5 MG tablet Take 5 mg by mouth daily with breakfast.    Yes [provider]      Allergies    Contrast media [iodinated contrast media], Iohexol, Mucinex [guaifenesin er], Zanaflex [tizanidine hcl], Penicillins, and Robaxin [methocarbamol]    Review of Systems   Review of Systems  Gastrointestinal:  Positive for abdominal pain.    Physical Exam Updated Vital Signs Pulse 92   Resp 18   SpO2 97%  Physical Exam Vitals and nursing note reviewed.  Constitutional:      Appearance: She is well-developed. She is ill-appearing.     Comments: Elderly, frail  HENT:     Head: Normocephalic and atraumatic.     Right Ear: External ear normal.     Left Ear: External ear normal.  Eyes:     General: No scleral icterus.       Right eye: No discharge.        Left eye: No discharge.     Conjunctiva/sclera: Conjunctivae normal.  Neck:     Trachea: No tracheal deviation.  Cardiovascular:     Rate and Rhythm: Normal rate and regular rhythm.  Pulmonary:     Effort: Pulmonary effort is normal. No respiratory distress.     Breath sounds: Normal breath sounds. No stridor. No wheezing or rales.  Abdominal:     General: Bowel sounds are normal. There is no distension.     Palpations: Abdomen is soft.     Tenderness: There is no abdominal tenderness. There is no guarding or rebound.  Genitourinary:    Comments: No fecal impaction noted on physical exam, soft dark stool Musculoskeletal:        General: No tenderness or deformity.     Cervical back: Neck supple.     Comments: Kyphotic  Skin:    General: Skin is warm and dry.     Findings: No rash.  Neurological:     General: No focal deficit present.     Mental Status: She is alert.     Cranial Nerves: No cranial nerve deficit, dysarthria or facial asymmetry.     Sensory: No sensory deficit.     Motor: No abnormal muscle tone or seizure activity.      Coordination: Coordination normal.  Psychiatric:        Mood and Affect: Mood normal.     ED Results / Procedures / Treatments   Labs (all labs ordered are listed, but only abnormal results are displayed) Labs Reviewed  COMPREHENSIVE METABOLIC PANEL - Abnormal; Notable for the following components:      Result Value   Sodium 132 (*)    Chloride 97 (*)    Glucose, Bld 104 (*)    BUN 31 (*)    Total Protein 5.8 (*)    Albumin 3.4 (*)  All other components within normal limits  CBC WITH DIFFERENTIAL/PLATELET - Abnormal; Notable for the following components:   HCT 34.9 (*)    All other components within normal limits  URINALYSIS, ROUTINE W REFLEX MICROSCOPIC - Abnormal; Notable for the following components:   APPearance HAZY (*)    Ketones, ur 5 (*)    Leukocytes,Ua LARGE (*)    Bacteria, UA RARE (*)    All other components within normal limits  LIPASE, BLOOD  POC OCCULT BLOOD, ED    EKG None  Radiology No results found.  Procedures Procedures    Medications Ordered in ED Medications  fentaNYL (SUBLIMAZE) injection 50 mcg (has no administration in time range)  sodium chloride 0.9 % bolus 500 mL (500 mLs Intravenous New Bag/Given 11/17/22 1501)    Followed by  0.9 %  sodium chloride infusion (has no administration in time range)  barium (READI-CAT 2) 2 % suspension 450 mL (450 mLs Oral Given 11/17/22 1501)    ED Course/ Medical Decision Making/ A&P Clinical Course as of 11/17/22 1537  Sat Nov 17, 2022  1502 CBC with Diff(!) [JK]  1502 BC normal [JK]  1502 Urinalysis, Routine w reflex microscopic(!) urinalysis does suggest UTI. [JK]    Clinical Course User Index [JK] Dorie Rank, MD                           Medical Decision Making Differential diagnosis includes but not limited to diverticulitis colitis ureterolithiasis, UTI.  Stool ball noted on plain films however patient does not have a fecal impaction on physical exam.  Will proceed with CT imaging  laboratory test to evaluate further  Amount and/or Complexity of Data Reviewed Labs: ordered. Decision-making details documented in ED Course. Radiology: ordered.  Risk Prescription drug management.   Patient presented to the ED for evaluation of abdominal pain.  Had an outpatient x-ray that suggested fecal impaction without signs of bowel obstruction.  On physical exam however there is no hard stool in the rectum.  Constipation still is a possibility but with her complaints of abdominal pain we will plan on CT scan for further evaluation.  Patient has an IV contrast allergy so we will proceed with oral contrast only.  Urinalysis does suggest UTI.  Family states patient has been recently diagnosed.  Care turned over to Dr Regenia Skeeter at shift change.        Final Clinical Impression(s) / ED Diagnoses Final diagnoses:  Abdominal pain, unspecified abdominal location  Acute cystitis without hematuria    Rx / DC Orders ED Discharge Orders     None         Dorie Rank, MD 11/17/22 1537

## 2022-11-17 NOTE — Discharge Instructions (Signed)
If you develop worsening, continued, or recurrent abdominal pain, uncontrolled vomiting, fever, chest or back pain, or any other new/concerning symptoms then return to the ER for evaluation.  

## 2022-11-17 NOTE — ED Notes (Signed)
Patient transported to CT 

## 2022-11-17 NOTE — ED Triage Notes (Signed)
Pt states she was informed she has a bowel obstruction and needed to come to ER.

## 2022-11-17 NOTE — ED Notes (Signed)
Got patient undressed in to a gown got patient some warm blankets patient is resting with family at bedside and call bell in reach

## 2022-11-19 DIAGNOSIS — R1084 Generalized abdominal pain: Secondary | ICD-10-CM | POA: Diagnosis not present

## 2022-11-19 DIAGNOSIS — K5900 Constipation, unspecified: Secondary | ICD-10-CM | POA: Diagnosis not present

## 2022-11-22 ENCOUNTER — Emergency Department (HOSPITAL_COMMUNITY): Payer: Medicare Other

## 2022-11-22 ENCOUNTER — Other Ambulatory Visit: Payer: Self-pay

## 2022-11-22 ENCOUNTER — Encounter (HOSPITAL_COMMUNITY): Payer: Self-pay | Admitting: Internal Medicine

## 2022-11-22 ENCOUNTER — Inpatient Hospital Stay (HOSPITAL_COMMUNITY)
Admission: EM | Admit: 2022-11-22 | Discharge: 2022-12-03 | DRG: 291 | Disposition: A | Discharge status: Home Health | Payer: Medicare Other | Attending: Internal Medicine | Admitting: Internal Medicine

## 2022-11-22 DIAGNOSIS — E871 Hypo-osmolality and hyponatremia: Secondary | ICD-10-CM | POA: Diagnosis not present

## 2022-11-22 DIAGNOSIS — I5032 Chronic diastolic (congestive) heart failure: Secondary | ICD-10-CM | POA: Diagnosis present

## 2022-11-22 DIAGNOSIS — E7800 Pure hypercholesterolemia, unspecified: Secondary | ICD-10-CM | POA: Diagnosis not present

## 2022-11-22 DIAGNOSIS — I7121 Aneurysm of the ascending aorta, without rupture: Secondary | ICD-10-CM | POA: Diagnosis not present

## 2022-11-22 DIAGNOSIS — I480 Paroxysmal atrial fibrillation: Secondary | ICD-10-CM | POA: Diagnosis present

## 2022-11-22 DIAGNOSIS — I361 Nonrheumatic tricuspid (valve) insufficiency: Secondary | ICD-10-CM

## 2022-11-22 DIAGNOSIS — I959 Hypotension, unspecified: Secondary | ICD-10-CM | POA: Diagnosis not present

## 2022-11-22 DIAGNOSIS — Z888 Allergy status to other drugs, medicaments and biological substances status: Secondary | ICD-10-CM

## 2022-11-22 DIAGNOSIS — K5900 Constipation, unspecified: Secondary | ICD-10-CM | POA: Diagnosis not present

## 2022-11-22 DIAGNOSIS — G8929 Other chronic pain: Secondary | ICD-10-CM | POA: Diagnosis present

## 2022-11-22 DIAGNOSIS — R0781 Pleurodynia: Secondary | ICD-10-CM | POA: Diagnosis not present

## 2022-11-22 DIAGNOSIS — M810 Age-related osteoporosis without current pathological fracture: Secondary | ICD-10-CM | POA: Diagnosis not present

## 2022-11-22 DIAGNOSIS — I712 Thoracic aortic aneurysm, without rupture, unspecified: Secondary | ICD-10-CM | POA: Diagnosis present

## 2022-11-22 DIAGNOSIS — M549 Dorsalgia, unspecified: Secondary | ICD-10-CM | POA: Diagnosis present

## 2022-11-22 DIAGNOSIS — Z91041 Radiographic dye allergy status: Secondary | ICD-10-CM

## 2022-11-22 DIAGNOSIS — K449 Diaphragmatic hernia without obstruction or gangrene: Secondary | ICD-10-CM | POA: Diagnosis not present

## 2022-11-22 DIAGNOSIS — E876 Hypokalemia: Secondary | ICD-10-CM | POA: Diagnosis present

## 2022-11-22 DIAGNOSIS — M419 Scoliosis, unspecified: Secondary | ICD-10-CM | POA: Diagnosis present

## 2022-11-22 DIAGNOSIS — Z79891 Long term (current) use of opiate analgesic: Secondary | ICD-10-CM

## 2022-11-22 DIAGNOSIS — E039 Hypothyroidism, unspecified: Secondary | ICD-10-CM | POA: Diagnosis present

## 2022-11-22 DIAGNOSIS — Z66 Do not resuscitate: Secondary | ICD-10-CM | POA: Diagnosis present

## 2022-11-22 DIAGNOSIS — Z853 Personal history of malignant neoplasm of breast: Secondary | ICD-10-CM

## 2022-11-22 DIAGNOSIS — I5033 Acute on chronic diastolic (congestive) heart failure: Secondary | ICD-10-CM | POA: Diagnosis present

## 2022-11-22 DIAGNOSIS — Z79890 Hormone replacement therapy (postmenopausal): Secondary | ICD-10-CM

## 2022-11-22 DIAGNOSIS — M542 Cervicalgia: Secondary | ICD-10-CM | POA: Diagnosis not present

## 2022-11-22 DIAGNOSIS — I10 Essential (primary) hypertension: Secondary | ICD-10-CM | POA: Diagnosis not present

## 2022-11-22 DIAGNOSIS — M7989 Other specified soft tissue disorders: Secondary | ICD-10-CM

## 2022-11-22 DIAGNOSIS — Z880 Allergy status to penicillin: Secondary | ICD-10-CM

## 2022-11-22 DIAGNOSIS — I340 Nonrheumatic mitral (valve) insufficiency: Secondary | ICD-10-CM | POA: Diagnosis present

## 2022-11-22 DIAGNOSIS — I083 Combined rheumatic disorders of mitral, aortic and tricuspid valves: Secondary | ICD-10-CM | POA: Diagnosis not present

## 2022-11-22 DIAGNOSIS — I50813 Acute on chronic right heart failure: Secondary | ICD-10-CM | POA: Diagnosis not present

## 2022-11-22 DIAGNOSIS — K219 Gastro-esophageal reflux disease without esophagitis: Secondary | ICD-10-CM | POA: Diagnosis present

## 2022-11-22 DIAGNOSIS — I272 Pulmonary hypertension, unspecified: Secondary | ICD-10-CM | POA: Diagnosis present

## 2022-11-22 DIAGNOSIS — R109 Unspecified abdominal pain: Principal | ICD-10-CM

## 2022-11-22 DIAGNOSIS — R609 Edema, unspecified: Secondary | ICD-10-CM

## 2022-11-22 DIAGNOSIS — R1030 Lower abdominal pain, unspecified: Secondary | ICD-10-CM | POA: Diagnosis not present

## 2022-11-22 DIAGNOSIS — Z7901 Long term (current) use of anticoagulants: Secondary | ICD-10-CM

## 2022-11-22 DIAGNOSIS — K8689 Other specified diseases of pancreas: Secondary | ICD-10-CM | POA: Diagnosis not present

## 2022-11-22 DIAGNOSIS — I110 Hypertensive heart disease with heart failure: Secondary | ICD-10-CM | POA: Diagnosis not present

## 2022-11-22 DIAGNOSIS — Z86718 Personal history of other venous thrombosis and embolism: Secondary | ICD-10-CM | POA: Diagnosis not present

## 2022-11-22 DIAGNOSIS — Z170 Estrogen receptor positive status [ER+]: Secondary | ICD-10-CM | POA: Diagnosis not present

## 2022-11-22 DIAGNOSIS — R54 Age-related physical debility: Secondary | ICD-10-CM | POA: Diagnosis not present

## 2022-11-22 DIAGNOSIS — I771 Stricture of artery: Secondary | ICD-10-CM | POA: Diagnosis not present

## 2022-11-22 DIAGNOSIS — R600 Localized edema: Secondary | ICD-10-CM | POA: Diagnosis present

## 2022-11-22 DIAGNOSIS — I509 Heart failure, unspecified: Secondary | ICD-10-CM

## 2022-11-22 DIAGNOSIS — M797 Fibromyalgia: Secondary | ICD-10-CM | POA: Diagnosis present

## 2022-11-22 DIAGNOSIS — Z8249 Family history of ischemic heart disease and other diseases of the circulatory system: Secondary | ICD-10-CM

## 2022-11-22 DIAGNOSIS — N858 Other specified noninflammatory disorders of uterus: Secondary | ICD-10-CM | POA: Diagnosis not present

## 2022-11-22 DIAGNOSIS — S22080A Wedge compression fracture of T11-T12 vertebra, initial encounter for closed fracture: Secondary | ICD-10-CM | POA: Diagnosis not present

## 2022-11-22 DIAGNOSIS — Z923 Personal history of irradiation: Secondary | ICD-10-CM

## 2022-11-22 DIAGNOSIS — R1013 Epigastric pain: Secondary | ICD-10-CM | POA: Diagnosis not present

## 2022-11-22 DIAGNOSIS — Z79899 Other long term (current) drug therapy: Secondary | ICD-10-CM

## 2022-11-22 LAB — TROPONIN I (HIGH SENSITIVITY)
Troponin I (High Sensitivity): 9 ng/L (ref <18)
Troponin I (High Sensitivity): 11 ng/L (ref <18)

## 2022-11-22 LAB — URINALYSIS, ROUTINE W REFLEX MICROSCOPIC
Bilirubin Urine: NEGATIVE
Glucose, UA: NEGATIVE mg/dL
Hgb urine dipstick: NEGATIVE
Ketones, ur: NEGATIVE mg/dL
Nitrite: NEGATIVE
Protein, ur: NEGATIVE mg/dL
Specific Gravity, Urine: 1.014 (ref 1.005–1.030)
pH: 6 (ref 5.0–8.0)

## 2022-11-22 LAB — COMPREHENSIVE METABOLIC PANEL
ALT: 25 U/L (ref 0–44)
AST: 30 U/L (ref 15–41)
Albumin: 3.7 g/dL (ref 3.5–5.0)
Alkaline Phosphatase: 54 U/L (ref 38–126)
Anion gap: 10 (ref 5–15)
BUN: 28 mg/dL — ABNORMAL HIGH (ref 8–23)
CO2: 23 mmol/L (ref 22–32)
Calcium: 9.2 mg/dL (ref 8.9–10.3)
Chloride: 99 mmol/L (ref 98–111)
Creatinine, Ser: 0.75 mg/dL (ref 0.44–1.00)
GFR, Estimated: >60 mL/min (ref >60)
Glucose, Bld: 130 mg/dL — ABNORMAL HIGH (ref 70–99)
Potassium: 4 mmol/L (ref 3.5–5.1)
Sodium: 132 mmol/L — ABNORMAL LOW (ref 135–145)
Total Bilirubin: 0.6 mg/dL (ref 0.3–1.2)
Total Protein: 6.5 g/dL (ref 6.5–8.1)

## 2022-11-22 LAB — CBC WITH DIFFERENTIAL/PLATELET
Abs Immature Granulocytes: 0.03 10*3/uL (ref 0.00–0.07)
Basophils Absolute: 0 10*3/uL (ref 0.0–0.1)
Basophils Relative: 1 %
Eosinophils Absolute: 0.1 10*3/uL (ref 0.0–0.5)
Eosinophils Relative: 1 %
HCT: 36.4 % (ref 36.0–46.0)
Hemoglobin: 12.8 g/dL (ref 12.0–15.0)
Immature Granulocytes: 0 %
Lymphocytes Relative: 13 %
Lymphs Abs: 1.1 10*3/uL (ref 0.7–4.0)
MCH: 30.1 pg (ref 26.0–34.0)
MCHC: 35.2 g/dL (ref 30.0–36.0)
MCV: 85.6 fL (ref 80.0–100.0)
Monocytes Absolute: 0.7 10*3/uL (ref 0.1–1.0)
Monocytes Relative: 9 %
Neutro Abs: 6.2 10*3/uL (ref 1.7–7.7)
Neutrophils Relative %: 76 %
Platelets: 186 10*3/uL (ref 150–400)
RBC: 4.25 MIL/uL (ref 3.87–5.11)
RDW: 13.5 % (ref 11.5–15.5)
WBC: 8.1 10*3/uL (ref 4.0–10.5)
nRBC: 0 % (ref 0.0–0.2)

## 2022-11-22 LAB — LIPASE, BLOOD: Lipase: 43 U/L (ref 11–51)

## 2022-11-22 LAB — BRAIN NATRIURETIC PEPTIDE: B Natriuretic Peptide: 303.9 pg/mL — ABNORMAL HIGH (ref 0.0–100.0)

## 2022-11-22 MED ORDER — FUROSEMIDE 10 MG/ML IJ SOLN
20.0000 mg | Freq: Once | INTRAMUSCULAR | Status: AC
  Administered 2022-11-22: 20 mg via INTRAVENOUS
  Filled 2022-11-22: qty 2

## 2022-11-22 MED ORDER — HYDROMORPHONE HCL 1 MG/ML IJ SOLN: 0.5000 mg | Freq: Once | INTRAMUSCULAR | Status: DC

## 2022-11-22 MED ORDER — APIXABAN 2.5 MG PO TABS
2.5000 mg | ORAL_TABLET | Freq: Two times a day (BID) | ORAL | Status: DC
  Administered 2022-11-22 – 2022-12-03 (×22): 2.5 mg via ORAL
  Filled 2022-11-22 (×22): qty 1

## 2022-11-22 MED ORDER — OMEPRAZOLE MAGNESIUM 20 MG PO TBEC: 20.0000 mg | DELAYED_RELEASE_TABLET | Freq: Every day | ORAL | Status: DC

## 2022-11-22 MED ORDER — PRAVASTATIN SODIUM 10 MG PO TABS
20.0000 mg | ORAL_TABLET | Freq: Every day | ORAL | Status: DC
  Administered 2022-11-22 – 2022-12-02 (×11): 20 mg via ORAL
  Filled 2022-11-22 (×11): qty 2

## 2022-11-22 MED ORDER — DOCUSATE SODIUM 100 MG PO CAPS
100.0000 mg | ORAL_CAPSULE | Freq: Every day | ORAL | Status: DC
  Administered 2022-11-22 – 2022-11-27 (×6): 100 mg via ORAL
  Filled 2022-11-22 (×6): qty 1

## 2022-11-22 MED ORDER — LEVOTHYROXINE SODIUM 25 MCG PO TABS
125.0000 ug | ORAL_TABLET | Freq: Every day | ORAL | Status: DC
  Administered 2022-11-23 – 2022-12-03 (×11): 125 ug via ORAL
  Filled 2022-11-22 (×11): qty 1

## 2022-11-22 MED ORDER — PANTOPRAZOLE SODIUM 40 MG PO TBEC
40.0000 mg | DELAYED_RELEASE_TABLET | Freq: Every day | ORAL | Status: DC
  Administered 2022-11-22 – 2022-11-23 (×2): 40 mg via ORAL
  Filled 2022-11-22 (×2): qty 1

## 2022-11-22 MED ORDER — MORPHINE SULFATE (PF) 2 MG/ML IV SOLN
2.0000 mg | Freq: Once | INTRAVENOUS | Status: AC
  Administered 2022-11-22: 2 mg via INTRAVENOUS
  Filled 2022-11-22: qty 1

## 2022-11-22 MED ORDER — LOSARTAN POTASSIUM 25 MG PO TABS
25.0000 mg | ORAL_TABLET | Freq: Every day | ORAL | Status: DC
  Administered 2022-11-23: 25 mg via ORAL
  Filled 2022-11-22: qty 1

## 2022-11-22 MED ORDER — FUROSEMIDE 10 MG/ML IJ SOLN
20.0000 mg | Freq: Two times a day (BID) | INTRAMUSCULAR | Status: DC
  Administered 2022-11-22 – 2022-11-23 (×2): 20 mg via INTRAVENOUS
  Filled 2022-11-22 (×2): qty 2

## 2022-11-22 MED ORDER — MECLIZINE HCL 12.5 MG PO TABS: 12.5000 mg | ORAL_TABLET | Freq: Every day | ORAL | Status: DC | PRN

## 2022-11-22 MED ORDER — DILTIAZEM HCL ER COATED BEADS 180 MG PO CP24
180.0000 mg | ORAL_CAPSULE | Freq: Every day | ORAL | Status: DC
  Administered 2022-11-23 – 2022-12-03 (×11): 180 mg via ORAL
  Filled 2022-11-22 (×11): qty 1

## 2022-11-22 MED ORDER — HYDROCODONE-ACETAMINOPHEN 5-325 MG PO TABS
1.0000 | ORAL_TABLET | ORAL | Status: DC | PRN
  Administered 2022-11-22 – 2022-11-26 (×8): 1 via ORAL
  Administered 2022-11-27 (×2): 2 via ORAL
  Administered 2022-11-28: 1 via ORAL
  Administered 2022-11-28: 2 via ORAL
  Administered 2022-11-29 – 2022-11-30 (×2): 1 via ORAL
  Administered 2022-11-30: 2 via ORAL
  Administered 2022-12-01: 1 via ORAL
  Administered 2022-12-01: 2 via ORAL
  Administered 2022-12-02 – 2022-12-03 (×3): 1 via ORAL
  Filled 2022-11-22 (×5): qty 1
  Filled 2022-11-22: qty 2
  Filled 2022-11-22 (×9): qty 1
  Filled 2022-11-22: qty 2
  Filled 2022-11-22 (×2): qty 1
  Filled 2022-11-22 (×3): qty 2
  Filled 2022-11-22: qty 1

## 2022-11-22 MED ORDER — RISEDRONATE SODIUM 150 MG PO TABS: 150.0000 mg | ORAL_TABLET | ORAL | Status: DC

## 2022-11-22 MED ORDER — CAPSAICIN 0.075 % EX CREA
TOPICAL_CREAM | Freq: Two times a day (BID) | CUTANEOUS | Status: DC
  Filled 2022-11-22: qty 60

## 2022-11-22 MED ORDER — POLYETHYLENE GLYCOL 3350 17 G PO PACK
17.0000 g | PACK | Freq: Every day | ORAL | Status: AC
  Administered 2022-11-22 – 2022-11-24 (×3): 17 g via ORAL
  Filled 2022-11-22 (×3): qty 1

## 2022-11-22 NOTE — ED Triage Notes (Addendum)
Care giver stated, she has had abdominal pain for a month. Had a scan due to  Pt here on Saturday for the same.  Pt stated, my legs are also swollen  Pt just finished taking an antibiotic this morning for a UTI.

## 2022-11-22 NOTE — ED Provider Notes (Signed)
[facility] EMERGENCY DEPARTMENT Provider Note   CSN: [id number] Arrival date & time: 11/22/22  1042     History  Chief Complaint  Patient presents with   Abdominal Pain   Constipation    [name] is a 87 y.o. female.  HPI 87 year old female presents with abdominal and back pain.  She has been feeling this pain for few weeks.  She had a CT scan about 5 days ago that was overall unrevealing but she still having pain at home.  She states she has been having bowel movements without difficulty.  She also has a history of chronic diastolic CHF and despite her typical Lasix regimen in addition to an extra dose of Lasix after her ED visit she has noticed worsening leg swelling.  Both legs are swollen but the left is worse than the right.  She has had 3 blood clots, on the left leg and she is concerned she has another.  No chest pain/dyspnea.  Is hard to describe the pain or where exactly it is but is primarily back but also in the abdomen.  Seems to be worse with movements.  She denies fevers. No significant pain with eating, but isn't eating much.  Home Medications Prior to Admission medications   Medication Sig Start Date End Date Taking? Authorizing Provider  apixaban (ELIQUIS) 2.5 MG TABS tablet Take 1 tablet (2.5 mg total) by mouth 2 (two) times daily. 10/30/22   Freada Bergeron, MD  Ascorbic Acid (VITA-C PO) Take 1,000 mg by mouth daily.    [provider]  calcium citrate-vitamin D (CITRACAL+D) 315-200 MG-UNIT per tablet Take 1 tablet by mouth 2 (two) times daily. Patient taking differently: Take 2 tablets by mouth daily. 01/28/13   [name], MD  cephALEXin (KEFLEX) 500 MG capsule Take 500 mg by mouth 3 (three) times daily. 11/16/22   [provider]  cholecalciferol (VITAMIN D) 1000 UNITS tablet Take 2,000 Units by mouth every evening. Vitamin D3    [provider]  diltiazem (CARDIZEM CD) 180 MG 24 hr capsule Take 1 capsule  daily 03/27/22   Freada Bergeron, MD  Docusate Calcium (STOOL SOFTENER PO) Take 100 mg by mouth at bedtime.    [provider]  estradiol (ESTRACE) 0.1 MG/GM vaginal cream Place 1 Applicatorful vaginally once a week.    [provider]  furosemide (LASIX) 20 MG tablet Take one tablet by mouth three times a week Patient taking differently: Take 20 mg by mouth 3 (three) times a week. Take one tablet by mouth three times a week Mon, wed, Friday 10/16/22   Elgie Collard, PA-C  HYDROcodone-acetaminophen (NORCO/VICODIN) 5-325 MG tablet Take 1-2 tablets by mouth every 4 (four) hours as needed (mild pain). 09/12/15   Reyne Dumas, MD  iron polysaccharides (NIFEREX) 150 MG capsule Take 150 mg by mouth 3 (three) times a week. M, W, F    [provider]  levothyroxine (SYNTHROID, LEVOTHROID) 125 MCG tablet Take 125 mcg by mouth daily before breakfast.    [provider]  losartan (COZAAR) 25 MG tablet Take 1 tablet (25 mg total) by mouth daily. 01/02/22   Freada Bergeron, MD  Meclizine HCl (BONINE PO) Take 12.5 mg by mouth daily as needed (dizziness).    [provider]  Multiple Vitamin (MULTIVITAMIN) tablet Take 1 tablet by mouth daily with breakfast.    [provider]  omeprazole (PRILOSEC OTC) 20 MG tablet Take 20 mg by  mouth daily before breakfast.     [provider]  OVER THE COUNTER MEDICATION Take 1,000 mg by mouth daily. D-Mannose 500 mg    [provider]  potassium chloride (KLOR-CON M) 10 MEQ tablet Take 1 tablet (10 mEq total) by mouth daily on Mon, Wed, Fri, and Sundays, with lasix administration. Patient taking differently: Take 10 mEq by mouth 3 (three) times a week. Take 1 tablet (10 mEq total) by mouth daily on Mon, Wed, Fri,  with lasix administration. 08/21/22   Freada Bergeron, MD  pravastatin (PRAVACHOL) 20 MG tablet Take 20 mg by mouth daily with supper.  09/02/11   [provider]   risedronate (ACTONEL) 150 MG tablet Take 150 mg by mouth every 30 (thirty) days. with water on empty stomach, nothing by mouth or lie down for next 30 minutes.    [provider]  solifenacin (VESICARE) 5 MG tablet Take 5 mg by mouth daily with breakfast.     [provider]      Allergies    Contrast media [iodinated contrast media], Iohexol, Mucinex [guaifenesin er], Zanaflex [tizanidine hcl], Penicillins, and Robaxin [methocarbamol]    Review of Systems   Review of Systems  Constitutional:  Negative for fever.  Respiratory:  Negative for shortness of breath.   Cardiovascular:  Positive for leg swelling. Negative for chest pain.  Gastrointestinal:  Positive for abdominal pain.  Musculoskeletal:  Positive for back pain.    Physical Exam Updated Vital Signs BP (!) 140/83   Pulse 76   Temp 98 F (36.7 C) (Oral)   Resp 14   Ht '4\' 11"'$  (1.499 m)   Wt 46.7 kg   SpO2 99%   BMI 20.80 kg/m  Physical Exam Vitals and nursing note reviewed.  Constitutional:      Appearance: She is well-developed.  HENT:     Head: Normocephalic and atraumatic.  Cardiovascular:     Rate and Rhythm: Normal rate and regular rhythm.     Heart sounds: Normal heart sounds.  Pulmonary:     Effort: Pulmonary effort is normal.     Breath sounds: Normal breath sounds.  Abdominal:     Palpations: Abdomen is soft.     Tenderness: There is no abdominal tenderness.  Musculoskeletal:     Thoracic back: Tenderness and bony tenderness present.     Lumbar back: Tenderness and bony tenderness present.     Right lower leg: Edema present.     Left lower leg: Edema present.     Comments: Significant kyphosis. Mid-T spine tenderness as well as lumbar tenderness. No obvious rash L>R lower extremity pitting edema  Skin:    General: Skin is warm and dry.  Neurological:     Mental Status: She is alert.     ED Results / Procedures / Treatments   Labs (all labs ordered are listed, but only  abnormal results are displayed) Labs Reviewed  COMPREHENSIVE METABOLIC PANEL - Abnormal; Notable for the following components:      Result Value   Sodium 132 (*)    Glucose, Bld 130 (*)    BUN 28 (*)    All other components within normal limits  URINALYSIS, ROUTINE W REFLEX MICROSCOPIC - Abnormal; Notable for the following components:   Leukocytes,Ua LARGE (*)    Bacteria, UA RARE (*)    All other components within normal limits  BRAIN NATRIURETIC PEPTIDE - Abnormal; Notable for the following components:   B Natriuretic Peptide 303.9 (*)  All other components within normal limits  CBC WITH DIFFERENTIAL/PLATELET  LIPASE, BLOOD  TROPONIN I (HIGH SENSITIVITY)  TROPONIN I (HIGH SENSITIVITY)    EKG EKG Interpretation  Date/Time:  Thursday November 22 2022 10:45:00 EST Ventricular Rate:  77 PR Interval:  190 QRS Duration: 86 QT Interval:  388 QTC Calculation: 439 R Axis:   -85 Text Interpretation: Sinus rhythm with occasional Premature ventricular complexes Possible Left atrial enlargement Left anterior fascicular block Anteroseptal infarct , age undetermined Interpretation limited secondary to artifact Confirmed by Sherwood Gambler [phone]) on 11/22/2022 2:06:20 PM  Radiology CT CHEST ABDOMEN PELVIS WO CONTRAST  Result Date: 11/22/2022 CLINICAL DATA:  Ascending thoracic aneurysm. EXAM: CT CHEST, ABDOMEN AND PELVIS WITHOUT CONTRAST TECHNIQUE: Multidetector CT imaging of the chest, abdomen and pelvis was performed following the standard protocol without IV contrast. RADIATION DOSE REDUCTION: This exam was performed according to the departmental dose-optimization program which includes automated exposure control, adjustment of the mA and/or kV according to patient size and/or use of iterative reconstruction technique. COMPARISON:  CT abdomen/pelvis 11/17/2022 and CT chest from 2018 FINDINGS: CT CHEST FINDINGS Cardiovascular: The heart is within normal limits in size for the patient's age.  Stable mitral valve annulus and coronary artery calcifications. Stable tortuosity and calcification of the thoracic aorta. Stable fusiform aneurysmal dilatation of the ascending thoracic aorta with maximum measurement of 4.6 cm. No change since 2018. Mediastinum/Nodes: Stable large hiatal hernia with a good portion of the stomach up in the chest. No mediastinal or hilar mass or adenopathy. Lungs/Pleura: No acute pulmonary findings. No worrisome pulmonary lesions. No pleural effusions or pleural lesions. Musculoskeletal: Severe scoliosis but no acute bony findings. CT ABDOMEN PELVIS FINDINGS Hepatobiliary: No hepatic lesions are identified without contrast. No intrahepatic biliary dilatation. No common bile duct dilatation. Pancreas: Pancreatic atrophy but no mass or evidence of acute inflammation. Spleen: Normal size. No focal lesions. Adrenals/Urinary Tract: Adrenal glands and kidneys are grossly normal. No renal or obstructing ureteral calculi. The bladder is grossly normal. Stomach/Bowel: Stomach, duodenum, small bowel and colon grossly normal. Vascular/Lymphatic: Stable atherosclerotic calcifications involving the aorta and iliac arteries but no aneurysm. No mesenteric or retroperitoneal mass or adenopathy. Reproductive: Vaginal pessary in place. Small uterus. Other: No pelvic mass or adenopathy. No free pelvic fluid collections. No inguinal mass or adenopathy. No abdominal wall hernia or subcutaneous lesions. Musculoskeletal: Stable sclerotic lesion involving the right iliac bone, likely benign bone island. No acute bony findings. Severe scoliosis and numerous lower thoracic and lumbar compression fractures with vertebral augmentation changes. IMPRESSION: 1. Stable 4.6 cm ascending thoracic aortic aneurysm. No change since 2018. Ascending thoracic aortic aneurysm. Recommend semi-annual imaging followup by CTA or MRA and referral to cardiothoracic surgery if not already obtained. This recommendation follows 2010  ACCF/AHA/AATS/ACR/ASA/SCA/SCAI/SIR/STS/SVM Guidelines for the Diagnosis and Management of Patients With Thoracic Aortic Disease. Circulation. 2010; 121: B939-Q300. Aortic aneurysm NOS (ICD10-I71.9) 2. Stable large hiatal hernia. 3. No acute abdominal/pelvic findings, mass lesions or adenopathy. 4. Atherosclerotic changes involving the abdominal aorta but no aneurysm. 5. Severe scoliosis and associated degenerative spine disease. Multiple lower thoracic and lumbar spine compression fractures with vertebral augmentation changes. Aortic Atherosclerosis (ICD10-I70.0). Electronically Signed   By: Marijo Sanes M.D.   On: 11/22/2022 14:50   DG Chest 2 View  Result Date: 11/22/2022 CLINICAL DATA:  Rib pain.  Worse with inspiration EXAM: CHEST - 2 VIEW COMPARISON:  10/10/2022 and older x-ray series FINDINGS: Film is rotated to left the patient is tilted. Enlarged cardiopericardial silhouette. Calcified tortuous  aorta. No consolidation, pneumothorax or effusion. Fixation hardware along the lower cervical spine at the edge of the imaging field. There is kyphosis centered over the lower thoracic spine with multilevel augmentation cement along compressed vertebral bodies. Please correlate with clinical history. Surgical clips overlie the right hemithorax. There is also a double density overlying the heart with air consistent with a hiatal hernia. IMPRESSION: Rotated and tilted radiograph. Enlarged cardiopericardial silhouette with a hiatal hernia. No consolidation, effusion or pneumothorax. The rib fractures identified on the prior study are less well identified on this current exam Electronically Signed   By: Jill Side M.D.   On: 11/22/2022 11:28    Procedures Procedures    Medications Ordered in ED Medications  morphine (PF) 2 MG/ML injection 2 mg (2 mg Intravenous Given 11/22/22 1419)  furosemide (LASIX) injection 20 mg (20 mg Intravenous Given 11/22/22 1420)    ED Course/ Medical Decision Making/ A&P                            Medical Decision Making Amount and/or Complexity of Data Reviewed Labs: ordered.    Details: Urine with large leukocytes but rare bacteria.  No nitrites.  WBC, hemoglobin, electrolytes all unremarkable.  Her BNP is elevated at 303. Radiology: ordered and independent interpretation performed.    Details: No overt CHF on chest x-ray.  No bowel obstruction or edema on CT. ECG/medicine tests: independent interpretation performed.    Details: Artifact present but no acute ischemia.  Risk Prescription drug management. Decision regarding hospitalization.   Patient presents with continued abdominal/back pain.  Is hard to localize exactly where the pain is.  I discussed that with the negative workup last time, I would recommend CT with contrast this time.  She appears to have a contrast allergy though she cannot remember exactly what happened.  She does not think she had a rash.  I discussed we can give her pretreatment with medicines, but after discussion of what I am looking for, she states she does not want to get the contrast.  We discussed limitations.  CT today does not show any significant findings.  She does appear fluid overloaded and I think would benefit from IV Lasix and so she is given a dose here but I think she will need admission for more.  Is unclear where her abdominal pain is coming from at this point.  She just came off antibiotics for a UTI and so I think it is less likely this is the source despite the large leukocytes in the urine.  She was given a dose of IV morphine for pain.  Discussed with hospitalist for admission, Dr. Roosevelt Locks.        Final Clinical Impression(s) / ED Diagnoses Final diagnoses:  Abdominal pain, unspecified abdominal location  Peripheral edema    Rx / DC Orders ED Discharge Orders     None         Sherwood Gambler, MD 11/22/22 1528

## 2022-11-22 NOTE — Progress Notes (Signed)
Lower extremity venous left study completed.  Preliminary results relayed to [place], MD.  See CV Proc for preliminary results report.   Babs Dabbs, RDMS, RVT  

## 2022-11-22 NOTE — ED Notes (Signed)
ED TO INPATIENT HANDOFF REPORT  ED Nurse Name and Phone #: Andee Poles [phone]  S Name/Age/Gender [name] 87 y.o. female Room/Bed: 030C/030C  Code Status   Code Status: DNR  Home/SNF/Other Home Patient oriented to: self, place, time, and situation Is this baseline? Yes   Triage Complete: Triage complete  Chief Complaint CHF (congestive heart failure) ([place]) [I50.9]  Triage Note Care giver stated, she has had abdominal pain for a month. Had a scan due to  Pt here on Saturday for the same.  Pt stated, my legs are also swollen  Pt just finished taking an antibiotic this morning for a UTI.    Allergies Allergies  Allergen Reactions   Contrast Media [Iodinated Contrast Media] Shortness Of Breath   Iohexol Shortness Of Breath   Mucinex [Guaifenesin Er] Other (See Comments)    DIZZINESS   Zanaflex [Tizanidine Hcl] Anxiety and Other (See Comments)    Dizziness   Penicillins Rash    Has patient had a PCN reaction causing immediate rash, facial/tongue/throat swelling, SOB or lightheadedness with hypotension: No Has patient had a PCN reaction causing severe rash involving mucus membranes or skin necrosis: No Has patient had a PCN reaction that required hospitalization No Has patient had a PCN reaction occurring within the last 10 years: No If all of the above answers are "NO", then may proceed with Cephalosporin use.    Robaxin [Methocarbamol] Diarrhea    Level of Care/Admitting Diagnosis ED Disposition     ED Disposition  Admit   Condition  --   Comment  Hospital Area: [place] [[id number]]  Level of Care: Telemetry Medical [104]  May admit patient to [facility] or [facility] if equivalent level of care is available:: No  Covid Evaluation: Asymptomatic - no recent exposure (last 10 days) testing not required  Diagnosis: CHF (congestive heart failure) [facility]) [[id number]]  Admitting Physician: Lequita Halt [[id number]]  Attending Physician: Lequita Halt [[id number]]  Certification:: I certify this patient will need inpatient services for at least 2 midnights  Estimated Length of Stay: 2          B Medical/Surgery History Past Medical History:  Diagnosis Date   Anxiety    Aortic regurgitation    Breast cancer ([place]) 08/20/2011   R breast DCIS, ER/PR +   Cataract 3 and 10/92   bilateral   Chronic diastolic CHF (congestive heart failure) ([place])    Compression fracture of fourth lumbar vertebra ([place])    DVT (deep venous thrombosis) ([place]) 08/2011   LL extremity    Fibromyalgia    Fracture lumbar vertebra-closed ([place])    Fracture of thoracic vertebra, closed ([place])    GERD (gastroesophageal reflux disease)    H/O hiatal hernia    History of blood clots    History of radiation therapy 01/2012   R breast   Hypercholesteremia    Hypertension    DR Orinda Kenner   Hypothyroidism    Mitral regurgitation    Mitral valve prolapse    Osteoporosis    PAF (paroxysmal atrial fibrillation) ([place])    a. dx 11/2016.   Pulmonary hypertension ([place])    Rib fractures    Thoracic ascending aortic aneurysm ([place])    a. followed by Dr. [place] Gum.   Tricuspid regurgitation    Past Surgical History:  Procedure Laterality Date   ANTERIOR CERVICAL DECOMP/DISCECTOMY FUSION N/A 09/09/2015   Procedure: Anterior Cervical Decompression and Fusion Cervical seven-Thorasic one ;  Surgeon: Erline Levine, MD;  Location: [facility] NEURO ORS;  Service: Neurosurgery;  Laterality: N/A;   [place]  10/16/2006   RIH - Dr Hassell Done   KYPHOPLASTY N/A 01/26/2013   Procedure: KYPHOPLASTY;  Surgeon: Kristeen Miss, MD;  Location: [place] [place] NEURO ORS;  Service: Neurosurgery;  Laterality: N/A;  T11 and L1   KYPHOPLASTY N/A 06/21/2017   Procedure: Lumbar four Kyphoplasty;  Surgeon: Erline Levine, MD;  Location: [place];  Service: Neurosurgery;  Laterality: N/A;   MASTECTOMY, PARTIAL  10/17/2011    Procedure: MASTECTOMY PARTIAL;  Surgeon: Haywood Lasso, MD;  Location: [place] [facility];  Service: General;  Laterality: Right;  needle guided   TEE WITHOUT CARDIOVERSION N/A 12/04/2016   Procedure: TRANSESOPHAGEAL ECHOCARDIOGRAM (TEE);  Surgeon: Pixie Casino, MD;  [facility];  Service: Cardiovascular;  Laterality: N/A;   TONSILLECTOMY  1944     A IV Location/Drains/Wounds Patient Lines/Drains/Airways Status     Active Line/Drains/Airways     Name Placement date Placement time Site Days   Peripheral IV 11/22/22 22 G Anterior;Left Forearm 11/22/22  1409  Forearm  less than 1            Intake/Output Last 24 hours  Intake/Output Summary (Last 24 hours) at 11/22/2022 1649 Last data filed at 11/22/2022 1635 Gross per 24 hour  Intake --  Output 1400 ml  Net -1400 ml    Labs/Imaging Results for orders placed or performed during the hospital encounter of 11/22/22 (from the past 48 hour(s))  CBC with Differential     Status: None   Collection Time: 11/22/22 11:07 AM  Result Value Ref Range   WBC 8.1 4.0 - 10.5 K/uL   RBC 4.25 3.87 - 5.11 MIL/uL   Hemoglobin 12.8 12.0 - 15.0 g/dL   HCT 36.4 36.0 - 46.0 %   MCV 85.6 80.0 - 100.0 fL   MCH 30.1 26.0 - 34.0 pg   MCHC 35.2 30.0 - 36.0 g/dL   RDW 13.5 11.5 - 15.5 %   Platelets 186 150 - 400 K/uL   nRBC 0.0 0.0 - 0.2 %   Neutrophils Relative % 76 %   Neutro Abs 6.2 1.7 - 7.7 K/uL   Lymphocytes Relative 13 %   Lymphs Abs 1.1 0.7 - 4.0 K/uL   Monocytes Relative 9 %   Monocytes Absolute 0.7 0.1 - 1.0 K/uL   Eosinophils Relative 1 %   Eosinophils Absolute 0.1 0.0 - 0.5 K/uL   Basophils Relative 1 %   Basophils Absolute 0.0 0.0 - 0.1 K/uL   Immature Granulocytes 0 %   Abs Immature Granulocytes 0.03 0.00 - 0.07 K/uL    Comment: Performed at [facility], [address] [place], [place] [address]  Comprehensive metabolic panel     Status: Abnormal   Collection Time: 11/22/22 11:07 AM  Result Value Ref Range    Sodium 132 (L) 135 - 145 mmol/L   Potassium 4.0 3.5 - 5.1 mmol/L   Chloride 99 98 - 111 mmol/L   CO2 23 22 - 32 mmol/L   Glucose, Bld 130 (H) 70 - 99 mg/dL    Comment: Glucose reference range applies only to samples taken after fasting for at least 8 hours.   BUN 28 (H) 8 - 23 mg/dL   Creatinine, Ser 0.75 0.44 - 1.00 mg/dL   Calcium 9.2  8.9 - 10.3 mg/dL   Total Protein 6.5 6.5 - 8.1 g/dL   Albumin 3.7 3.5 - 5.0 g/dL   AST 30 15 - 41 U/L   ALT 25 0 - 44 U/L   Alkaline Phosphatase 54 38 - 126 U/L   Total Bilirubin 0.6 0.3 - 1.2 mg/dL   GFR, Estimated >60 >60 mL/min    Comment: (NOTE) Calculated using the CKD-EPI Creatinine Equation (2021)    Anion gap 10 5 - 15    Comment: Performed at [facility] [address] [place], [place] [address]  Lipase, blood     Status: None   Collection Time: 11/22/22 11:07 AM  Result Value Ref Range   Lipase 43 11 - 51 U/L    Comment: Performed at [facility] [address] [place], [place] [address]  Brain natriuretic peptide     Status: Abnormal   Collection Time: 11/22/22 11:07 AM  Result Value Ref Range   B Natriuretic Peptide 303.9 (H) 0.0 - 100.0 pg/mL    Comment: Performed at [facility] [address] [place], [place] [address]  Urinalysis, Routine w reflex microscopic     Status: Abnormal   Collection Time: 11/22/22 12:47 PM  Result Value Ref Range   Color, Urine YELLOW YELLOW   APPearance CLEAR CLEAR   Specific Gravity, Urine 1.014 1.005 - 1.030   pH 6.0 5.0 - 8.0   Glucose, UA NEGATIVE NEGATIVE mg/dL   Hgb urine dipstick NEGATIVE NEGATIVE   Bilirubin Urine NEGATIVE NEGATIVE   Ketones, ur NEGATIVE NEGATIVE mg/dL   Protein, ur NEGATIVE NEGATIVE mg/dL   Nitrite NEGATIVE NEGATIVE   Leukocytes,Ua LARGE (A) NEGATIVE   RBC / HPF 0-5 0 - 5 RBC/hpf   WBC, UA 21-50 0 - 5 WBC/hpf   Bacteria, UA RARE (A) NONE SEEN   Squamous Epithelial / HPF 0-5 0 - 5 /HPF   Mucus PRESENT     Comment: Performed at [facility], [place] [address], [place], [place] [address]  Troponin I (High Sensitivity)     Status: None   Collection Time: 11/22/22  2:14 PM  Result Value Ref Range   Troponin I (High Sensitivity) 9 <18 ng/L    Comment: (NOTE) Elevated high sensitivity troponin I (hsTnI) values and significant  changes across serial measurements may suggest ACS but many other  chronic and acute conditions are known to elevate hsTnI results.  Refer to the "Links" section for chest pain algorithms and additional  guidance. Performed at [facility], [place] [address], [place],  [address]    VAS [place] LOWER EXTREMITY VENOUS (DVT) (7a-7p)  Result Date: 11/22/2022  Lower Venous DVT Study Patient Name:  [name]  Date of Exam:   11/22/2022 Medical Rec #: [id number]           Accession #:    [id number] Date of Birth: [birth year]           Patient Gender: F Patient Age:   38 years Exam Location:  [facility] Procedure:      VAS [place] LOWER EXTREMITY VENOUS (DVT) Referring Phys: Nicki Reaper [place] --------------------------------------------------------------------------------  Indications: Bilateral lower extremity venous swelling, left>right, skin changes.  Risk Factors: CHF. Comparison Study: 02-06-2022 Prior left lower extremity venous study was                   negative for DVT. Performing Technologist: Darlin Coco RDMS, RVT  Examination Guidelines: A complete evaluation includes B-mode imaging, spectral Doppler, color Doppler, and power Doppler as needed of all accessible portions of each vessel. Bilateral testing is considered an integral part of a complete examination. Limited examinations for reoccurring indications may be performed as noted. The reflux portion of the exam is performed with the patient in reverse Trendelenburg.  +-----+---------------+---------+-----------+----------+--------------+ RIGHTCompressibilityPhasicitySpontaneityPropertiesThrombus Aging  +-----+---------------+---------+-----------+----------+--------------+ CFV  Full           Yes      Yes                                 +-----+---------------+---------+-----------+----------+--------------+   +---------+---------------+---------+-----------+----------+--------------+ LEFT     CompressibilityPhasicitySpontaneityPropertiesThrombus Aging +---------+---------------+---------+-----------+----------+--------------+ CFV      Full           Yes      Yes                                 +---------+---------------+---------+-----------+----------+--------------+ SFJ      Full                                                        +---------+---------------+---------+-----------+----------+--------------+ FV Prox  Full                                                        +---------+---------------+---------+-----------+----------+--------------+ FV Mid   Full                                                        +---------+---------------+---------+-----------+----------+--------------+ FV DistalFull                                                        +---------+---------------+---------+-----------+----------+--------------+ PFV      Full                                                        +---------+---------------+---------+-----------+----------+--------------+ POP      Full           Yes      Yes                                 +---------+---------------+---------+-----------+----------+--------------+ PTV      Full                                                        +---------+---------------+---------+-----------+----------+--------------+  PERO     Full                                                        +---------+---------------+---------+-----------+----------+--------------+     Summary: RIGHT: - No evidence of common femoral vein obstruction.  LEFT: - There is no evidence of deep vein thrombosis in the  lower extremity.  - No cystic structure found in the popliteal fossa.  *See table(s) above for measurements and observations.    Preliminary    CT CHEST ABDOMEN PELVIS WO CONTRAST  Result Date: 11/22/2022 CLINICAL DATA:  Ascending thoracic aneurysm. EXAM: CT CHEST, ABDOMEN AND PELVIS WITHOUT CONTRAST TECHNIQUE: Multidetector CT imaging of the chest, abdomen and pelvis was performed following the standard protocol without IV contrast. RADIATION DOSE REDUCTION: This exam was performed according to the departmental dose-optimization program which includes automated exposure control, adjustment of the mA and/or kV according to patient size and/or use of iterative reconstruction technique. COMPARISON:  CT abdomen/pelvis 11/17/2022 and CT chest from 2018 FINDINGS: CT CHEST FINDINGS Cardiovascular: The heart is within normal limits in size for the patient's age. Stable mitral valve annulus and coronary artery calcifications. Stable tortuosity and calcification of the thoracic aorta. Stable fusiform aneurysmal dilatation of the ascending thoracic aorta with maximum measurement of 4.6 cm. No change since 2018. Mediastinum/Nodes: Stable large hiatal hernia with a good portion of the stomach up in the chest. No mediastinal or hilar mass or adenopathy. Lungs/Pleura: No acute pulmonary findings. No worrisome pulmonary lesions. No pleural effusions or pleural lesions. Musculoskeletal: Severe scoliosis but no acute bony findings. CT ABDOMEN PELVIS FINDINGS Hepatobiliary: No hepatic lesions are identified without contrast. No intrahepatic biliary dilatation. No common bile duct dilatation. Pancreas: Pancreatic atrophy but no mass or evidence of acute inflammation. Spleen: Normal size. No focal lesions. Adrenals/Urinary Tract: Adrenal glands and kidneys are grossly normal. No renal or obstructing ureteral calculi. The bladder is grossly normal. Stomach/Bowel: Stomach, duodenum, small bowel and colon grossly normal.  Vascular/Lymphatic: Stable atherosclerotic calcifications involving the aorta and iliac arteries but no aneurysm. No mesenteric or retroperitoneal mass or adenopathy. Reproductive: Vaginal pessary in place. Small uterus. Other: No pelvic mass or adenopathy. No free pelvic fluid collections. No inguinal mass or adenopathy. No abdominal wall hernia or subcutaneous lesions. Musculoskeletal: Stable sclerotic lesion involving the right iliac bone, likely benign bone island. No acute bony findings. Severe scoliosis and numerous lower thoracic and lumbar compression fractures with vertebral augmentation changes. IMPRESSION: 1. Stable 4.6 cm ascending thoracic aortic aneurysm. No change since 2018. Ascending thoracic aortic aneurysm. Recommend semi-annual imaging followup by CTA or MRA and referral to cardiothoracic surgery if not already obtained. This recommendation follows 2010 ACCF/AHA/AATS/ACR/ASA/SCA/SCAI/SIR/STS/SVM Guidelines for the Diagnosis and Management of Patients With Thoracic Aortic Disease. Circulation. 2010; 121: P382-N053. Aortic aneurysm NOS (ICD10-I71.9) 2. Stable large hiatal hernia. 3. No acute abdominal/pelvic findings, mass lesions or adenopathy. 4. Atherosclerotic changes involving the abdominal aorta but no aneurysm. 5. Severe scoliosis and associated degenerative spine disease. Multiple lower thoracic and lumbar spine compression fractures with vertebral augmentation changes. Aortic Atherosclerosis (ICD10-I70.0). Electronically Signed   By: Marijo Sanes M.D.   On: 11/22/2022 14:50   DG Chest 2 View  Result Date: 11/22/2022 CLINICAL DATA:  Rib pain.  Worse with inspiration EXAM: CHEST - 2 VIEW COMPARISON:  10/10/2022 and older  x-ray series FINDINGS: Film is rotated to left the patient is tilted. Enlarged cardiopericardial silhouette. Calcified tortuous aorta. No consolidation, pneumothorax or effusion. Fixation hardware along the lower cervical spine at the edge of the imaging field. There is  kyphosis centered over the lower thoracic spine with multilevel augmentation cement along compressed vertebral bodies. Please correlate with clinical history. Surgical clips overlie the right hemithorax. There is also a double density overlying the heart with air consistent with a hiatal hernia. IMPRESSION: Rotated and tilted radiograph. Enlarged cardiopericardial silhouette with a hiatal hernia. No consolidation, effusion or pneumothorax. The rib fractures identified on the prior study are less well identified on this current exam Electronically Signed   By: Jill Side M.D.   On: 11/22/2022 11:28    Pending Labs Unresulted Labs (From admission, onward)     Start     Ordered   11/23/22 0814  Basic metabolic panel  Tomorrow morning,   R        11/22/22 1639   11/23/22 4818  Basic metabolic panel  Daily,   R     Comments: As Scheduled for 5 days    11/22/22 1639            Vitals/Pain Today's Vitals   11/22/22 1300 11/22/22 1415 11/22/22 1615 11/22/22 1634  BP:  (!) 140/83 (!) 146/98   Pulse:  76 79   Resp:  14 (!) 24   Temp: 98 F (36.7 C)     TempSrc: Oral     SpO2:  99% 100%   Weight:      Height:      PainSc:    5     Isolation Precautions No active isolations  Medications Medications  capsicum (ZOSTRIX) 0.075 % cream (has no administration in time range)  furosemide (LASIX) injection 20 mg (has no administration in time range)  morphine (PF) 2 MG/ML injection 2 mg (2 mg Intravenous Given 11/22/22 1419)  furosemide (LASIX) injection 20 mg (20 mg Intravenous Given 11/22/22 1420)    Mobility walks with device High fall risk   Focused Assessments    R Recommendations: See Admitting Provider Note  Report given to:   Additional Notes:  [place] receiving Lasix.

## 2022-11-22 NOTE — H&P (Signed)
History and Physical    [name] YPP:[id number] DOB: July 17, 1936 DOA: 11/22/2022  PCP: Corliss Blacker, MD (Confirm with patient/family/NH records and if not entered, this has to be entered at [facility] point of entry) Patient coming from: Home  I have personally briefly reviewed patient's old medical records in [place]  Chief Complaint: Belly hurts, leg hurt  HPI: [name] is a 87 y.o. female with medical history significant of PAF on Eliquis, chronic HFpEF, HTN, HLD, thoracic aneurysm (contraindicated for repair due to severe kyphosis), severe pulmonary hypertension, severe TR, chronic back pain on narcotics, hypothyroidism, presented with persistent abdominal pain, and worsening of lower extremity swelling.  Her symptoms for started about 2 weeks ago, when patient started develop upper abdominal pain, sometimes epigastric sometimes " traveling across under her rib cage" sharp-like, worsening with deep breath cough and/or upper torso movement.  She been feeling nauseous but no vomiting.  She also admitted that she has been constipated " quite often last few weeks" and patient reported poor tolerance to most of the OTC stool softener and laxative.  5 days ago, patient came to the ED for the first time when she was evaluated including a CT abdomen pelvis with p.o. contrast as patient has severe IV contrast allergy "throat closed on those dyes", but nothing major was identified and patient was diagnosed with constipation and a UTI and sent home.  And patient completed 5 days course of antibiotics but she continued to experience frequent episodes of abdominal pain, similar location and characters, " sometimes switches sides from left to right, right below rib cage, worsening with deep breathing and chest movement" she has been taking half tablet of hydrocodone for chronic back pain and does not wish to take more than she used to for any pain.  Denies any fever chills no chest  pain.  Her last bowel movement was 3 to 4 days ago.  She reported decrease of oral intake but she drinks about 1.8-2.0 L of fluid every day and for the last 2 weeks she has noticed increasing swelling of her legs, " skin still stretchy and pain"   ED Course: Vital signs are stable, afebrile, none tachycardia nonhypotensive SBP 140s, O2 saturation 98% on room air, CT abdominal pelvis showed no significant acute findings but stable 4.6 cm ascending thoracic aortic aneurysm and stable large hiatal hernia.  Work showed WBC 8.1, sodium 132, potassium 4.0, creatinine 0.7, glucose 130.  Review of Systems: As per HPI otherwise 14 point review of systems negative.    Past Medical History:  Diagnosis Date   Anxiety    Aortic regurgitation    Breast cancer ([place]) 08/20/2011   R breast DCIS, ER/PR +   Cataract 3 and 10/92   bilateral   Chronic diastolic CHF (congestive heart failure) (HCC)    Compression fracture of fourth lumbar vertebra (HCC)    DVT (deep venous thrombosis) ([place]) 08/2011   LL extremity    Fibromyalgia    Fracture lumbar vertebra-closed (HCC)    Fracture of thoracic vertebra, closed (HCC)    GERD (gastroesophageal reflux disease)    H/O hiatal hernia    History of blood clots    History of radiation therapy 01/2012   R breast   Hypercholesteremia    Hypertension    DR Orinda Kenner   Hypothyroidism    Mitral regurgitation    Mitral valve prolapse    Osteoporosis    PAF (paroxysmal atrial fibrillation) (  [place])    a. dx 11/2016.   Pulmonary hypertension ([place])    Rib fractures    Thoracic ascending aortic aneurysm ([place])    a. followed by Dr. Prescott Gum.   Tricuspid regurgitation     Past Surgical History:  Procedure Laterality Date   ANTERIOR CERVICAL DECOMP/DISCECTOMY FUSION N/A 09/09/2015   Procedure: Anterior Cervical Decompression and Fusion Cervical seven-Thorasic one ;  Surgeon: Erline Levine, MD;  Location: [place] NEURO ORS;  Service: Neurosurgery;  Laterality: N/A;    [place]  10/16/2006   RIH - Dr Hassell Done   KYPHOPLASTY N/A 01/26/2013   Procedure: KYPHOPLASTY;  Surgeon: Kristeen Miss, MD;  Location: [place] NEURO ORS;  Service: Neurosurgery;  Laterality: N/A;  T11 and L1   KYPHOPLASTY N/A 06/21/2017   Procedure: Lumbar four Kyphoplasty;  Surgeon: Erline Levine, MD;  Location: [place];  Service: Neurosurgery;  Laterality: N/A;   MASTECTOMY, PARTIAL  10/17/2011   Procedure: MASTECTOMY PARTIAL;  Surgeon: Haywood Lasso, MD;  Location: [place];  Service: General;  Laterality: Right;  needle guided   TEE WITHOUT CARDIOVERSION N/A 12/04/2016   Procedure: TRANSESOPHAGEAL ECHOCARDIOGRAM (TEE);  Surgeon: Pixie Casino, MD;  Location: [facility];  Service: Cardiovascular;  Laterality: N/A;   TONSILLECTOMY  1944     reports that she has never smoked. She has never used smokeless tobacco. She reports that she does not drink alcohol and does not use drugs.  Allergies  Allergen Reactions   Contrast Media [Iodinated Contrast Media] Shortness Of Breath   Iohexol Shortness Of Breath   Mucinex [Guaifenesin Er] Other (See Comments)    DIZZINESS   Zanaflex [Tizanidine Hcl] Anxiety and Other (See Comments)    Dizziness   Penicillins Rash    Has patient had a PCN reaction causing immediate rash, facial/tongue/throat swelling, SOB or lightheadedness with hypotension: No Has patient had a PCN reaction causing severe rash involving mucus membranes or skin necrosis: No Has patient had a PCN reaction that required hospitalization No Has patient had a PCN reaction occurring within the last 10 years: No If all of the above answers are "NO", then may proceed with Cephalosporin use.    Robaxin [Methocarbamol] Diarrhea    Family History  Problem Relation Age of Onset   Heart disease Mother    Heart disease Maternal Uncle    Heart disease Maternal Grandfather     Prior to  Admission medications   Medication Sig Start Date End Date Taking? Authorizing Provider  apixaban (ELIQUIS) 2.5 MG TABS tablet Take 1 tablet (2.5 mg total) by mouth 2 (two) times daily. 10/30/22  Yes Freada Bergeron, MD  Ascorbic Acid (VITA-C PO) Take 1,000 mg by mouth daily.   Yes [provider]  calcium citrate-vitamin D (CITRACAL+D) 315-200 MG-UNIT per tablet Take 1 tablet by mouth 2 (two) times daily. Patient taking differently: Take 2 tablets by mouth daily. 01/28/13  Yes [name], MD  cholecalciferol (VITAMIN D) 1000 UNITS tablet Take 2,000 Units by mouth every evening. Vitamin D3   Yes [provider]  diltiazem (CARDIZEM CD) 180 MG 24 hr capsule Take 1 capsule daily Patient taking differently: Take 180 mg by mouth daily. Take 1 capsule daily 03/27/22  Yes Pemberton, Greer Ee, MD  Docusate Calcium (STOOL SOFTENER PO) Take 100 mg by mouth at bedtime.   Yes [provider]  estradiol (ESTRACE) 0.1 MG/GM vaginal cream Place 1 Applicatorful vaginally once a week.   Yes [provider]  furosemide (LASIX) 20 MG tablet Take one tablet by mouth three times a week Patient taking differently: Take 20 mg by mouth 3 (three) times a week. Take one tablet by mouth three times a week Mon, wed, Friday 10/16/22  Yes Conte, Tessa N, PA-C  HYDROcodone-acetaminophen (NORCO/VICODIN) 5-325 MG tablet Take 1-2 tablets by mouth every 4 (four) hours as needed (mild pain). 09/12/15  Yes Reyne Dumas, MD  iron polysaccharides (NIFEREX) 150 MG capsule Take 150 mg by mouth 3 (three) times a week. M, W, F   Yes [provider]  levothyroxine (SYNTHROID, LEVOTHROID) 125 MCG tablet Take 125 mcg by mouth daily before breakfast.   Yes [provider]  losartan (COZAAR) 25 MG tablet Take 1 tablet (25 mg total) by mouth daily. 01/02/22  Yes Freada Bergeron, MD  Meclizine HCl (BONINE PO) Take 12.5 mg by mouth daily as needed (dizziness).   Yes [provider]  Multiple Vitamin (MULTIVITAMIN) tablet Take 1 tablet by mouth daily with breakfast.   Yes [provider]  omeprazole (PRILOSEC OTC) 20 MG tablet Take 20 mg by mouth daily before breakfast.    Yes [provider]  OVER THE COUNTER MEDICATION Take 1,000 mg by mouth daily. D-Mannose 500 mg   Yes [provider]  potassium chloride (KLOR-CON M) 10 MEQ tablet Take 1 tablet (10 mEq total) by mouth daily on Mon, Wed, Fri, and Sundays, with lasix administration. Patient taking differently: Take 10 mEq by mouth 3 (three) times a week. Take 1 tablet (10 mEq total) by mouth daily on Mon, Wed, Fri,  with lasix administration. 08/21/22  Yes Freada Bergeron, MD  pravastatin (PRAVACHOL) 20 MG tablet Take 20 mg by mouth daily with supper.  09/02/11  Yes [provider]  risedronate (ACTONEL) 150 MG tablet Take 150 mg by mouth every 30 (thirty) days. with water on empty stomach, nothing by mouth or lie down for next 30 minutes.   Yes [provider]  solifenacin (VESICARE) 10 MG tablet Take 10 mg by mouth daily. 11/21/22  Yes [provider]    Physical Exam: Vitals:   11/22/22 1057 11/22/22 1300 11/22/22 1415 11/22/22 1615  BP:   (!) 140/83 (!) 146/98  Pulse:   76 79  Resp:   14 (!) 24  Temp:  98 F (36.7 C)    TempSrc:  Oral    SpO2:   99% 100%  Weight: 46.7 kg     Height: '4\' 11"'$  (1.499 m)       Constitutional: NAD, calm, comfortable Vitals:   11/22/22 1057 11/22/22 1300 11/22/22 1415 11/22/22 1615  BP:   (!) 140/83 (!) 146/98  Pulse:   76 79  Resp:   14 (!) 24  Temp:  98 F (36.7 C)    TempSrc:  Oral    SpO2:   99% 100%  Weight: 46.7 kg     Height: '4\' 11"'$  (1.499 m)      Eyes: PERRL, lids and conjunctivae normal ENMT: Mucous membranes are moist. Posterior pharynx clear of any exudate or lesions.Normal dentition.  Neck: normal, supple, no masses, no thyromegaly Respiratory: clear to auscultation bilaterally, no  wheezing, no crackles. Normal respiratory effort. No accessory muscle use.  Cardiovascular: Regular rate and rhythm, no murmurs / rubs / gallops. No extremity edema. 2+ pedal pulses. No carotid bruits.  Abdomen:  mild tenderness on deep palpation under bilateral rib cage, no guarding no masses palpated. No hepatosplenomegaly. Bowel sounds positive.  Musculoskeletal: no clubbing / cyanosis. No joint deformity upper and lower extremities. Good ROM, no contractures. Normal muscle tone.  Skin: no rashes, lesions, ulcers. No induration Neurologic: CN 2-12 grossly intact. Sensation intact, DTR normal. Strength 5/5 in all 4.  Psychiatric: Normal judgment and insight. Alert and oriented x 3. Normal mood.     Labs on Admission: I have personally reviewed following labs and imaging studies  CBC: Recent Labs  Lab 11/17/22 1355 11/22/22 1107  WBC 9.4 8.1  NEUTROABS 7.1 6.2  HGB 12.4 12.8  HCT 34.9* 36.4  MCV 86.0 85.6  PLT 153 161   Basic Metabolic Panel: Recent Labs  Lab 11/17/22 1355 11/22/22 1107  NA 132* 132*  K 3.8 4.0  CL 97* 99  CO2 24 23  GLUCOSE 104* 130*  BUN 31* 28*  CREATININE 0.74 0.75  CALCIUM 9.1 9.2   GFR: Estimated Creatinine Clearance: 35.7 mL/min (by C-G formula based on SCr of 0.75 mg/dL). Liver Function Tests: Recent Labs  Lab 11/17/22 1355 11/22/22 1107  AST 29 30  ALT 21 25  ALKPHOS 46 54  BILITOT 0.8 0.6  PROT 5.8* 6.5  ALBUMIN 3.4* 3.7   Recent Labs  Lab 11/17/22 1355 11/22/22 1107  LIPASE 32 43   No results for input(s): "AMMONIA" in the last 168 hours. Coagulation Profile: No results for input(s): "INR", "PROTIME" in the last 168 hours. Cardiac Enzymes: No results for input(s): "CKTOTAL", "CKMB", "CKMBINDEX", "TROPONINI" in the last 168 hours. BNP (last 3 results) No results for input(s): "PROBNP" in the last 8760 hours. HbA1C: No results for input(s): "HGBA1C" in the last 72 hours. CBG: No results for input(s): "GLUCAP" in the last  168 hours. Lipid Profile: No results for input(s): "CHOL", "HDL", "LDLCALC", "TRIG", "CHOLHDL", "LDLDIRECT" in the last 72 hours. Thyroid Function Tests: No results for input(s): "TSH", "T4TOTAL", "FREET4", "T3FREE", "THYROIDAB" in the last 72 hours. Anemia Panel: No results for input(s): "VITAMINB12", "FOLATE", "FERRITIN", "TIBC", "IRON", "RETICCTPCT" in the last 72 hours. Urine analysis:    Component Value Date/Time   COLORURINE YELLOW 11/22/2022 1247   APPEARANCEUR CLEAR 11/22/2022 1247   LABSPEC 1.014 11/22/2022 1247   PHURINE 6.0 11/22/2022 1247   GLUCOSEU NEGATIVE 11/22/2022 1247   HGBUR NEGATIVE 11/22/2022 1247   BILIRUBINUR NEGATIVE 11/22/2022 1247   KETONESUR NEGATIVE 11/22/2022 1247   PROTEINUR NEGATIVE 11/22/2022 1247   UROBILINOGEN 0.2 09/04/2015 1404   NITRITE NEGATIVE 11/22/2022 1247   LEUKOCYTESUR LARGE (A) 11/22/2022 1247    Radiological Exams on Admission: VAS [place] LOWER EXTREMITY VENOUS (DVT) (7a-7p)  Result Date: 11/22/2022  Lower Venous DVT Study Patient Name:  Kirby  Date of Exam:   11/22/2022 Medical Rec #: [id number]           Accession #:    [id number] Date of Birth: 04/21/1936           Patient Gender: F Patient Age:   48 years Exam Location:  [facility] Procedure:      VAS [place] LOWER EXTREMITY VENOUS (DVT) Referring Phys: Nicki Reaper [place] --------------------------------------------------------------------------------  Indications: Bilateral lower extremity venous swelling, left>right, skin changes.  Risk Factors: CHF. Comparison Study: 02-06-2022 Prior left lower extremity venous study was                   negative for DVT. Performing Technologist: Darlin Coco RDMS, RVT  Examination  Guidelines: A complete evaluation includes B-mode imaging, spectral Doppler, color Doppler, and power Doppler as needed of all accessible portions of each vessel. Bilateral testing is considered an integral part of a complete examination. Limited examinations for  reoccurring indications may be performed as noted. The reflux portion of the exam is performed with the patient in reverse Trendelenburg.  +-----+---------------+---------+-----------+----------+--------------+ RIGHTCompressibilityPhasicitySpontaneityPropertiesThrombus Aging +-----+---------------+---------+-----------+----------+--------------+ CFV  Full           Yes      Yes                                 +-----+---------------+---------+-----------+----------+--------------+   +---------+---------------+---------+-----------+----------+--------------+ LEFT     CompressibilityPhasicitySpontaneityPropertiesThrombus Aging +---------+---------------+---------+-----------+----------+--------------+ CFV      Full           Yes      Yes                                 +---------+---------------+---------+-----------+----------+--------------+ SFJ      Full                                                        +---------+---------------+---------+-----------+----------+--------------+ FV Prox  Full                                                        +---------+---------------+---------+-----------+----------+--------------+ FV Mid   Full                                                        +---------+---------------+---------+-----------+----------+--------------+ FV DistalFull                                                        +---------+---------------+---------+-----------+----------+--------------+ PFV      Full                                                        +---------+---------------+---------+-----------+----------+--------------+ POP      Full           Yes      Yes                                 +---------+---------------+---------+-----------+----------+--------------+ PTV      Full                                                        +---------+---------------+---------+-----------+----------+--------------+  PERO     Full                                                        +---------+---------------+---------+-----------+----------+--------------+     Summary: RIGHT: - No evidence of common femoral vein obstruction.  LEFT: - There is no evidence of deep vein thrombosis in the lower extremity.  - No cystic structure found in the popliteal fossa.  *See table(s) above for measurements and observations. Electronically signed by Jamelle Haring on 11/22/2022 at 4:53:09 PM.    Final    CT CHEST ABDOMEN PELVIS WO CONTRAST  Result Date: 11/22/2022 CLINICAL DATA:  Ascending thoracic aneurysm. EXAM: CT CHEST, ABDOMEN AND PELVIS WITHOUT CONTRAST TECHNIQUE: Multidetector CT imaging of the chest, abdomen and pelvis was performed following the standard protocol without IV contrast. RADIATION DOSE REDUCTION: This exam was performed according to the departmental dose-optimization program which includes automated exposure control, adjustment of the mA and/or kV according to patient size and/or use of iterative reconstruction technique. COMPARISON:  CT abdomen/pelvis 11/17/2022 and CT chest from 2018 FINDINGS: CT CHEST FINDINGS Cardiovascular: The heart is within normal limits in size for the patient's age. Stable mitral valve annulus and coronary artery calcifications. Stable tortuosity and calcification of the thoracic aorta. Stable fusiform aneurysmal dilatation of the ascending thoracic aorta with maximum measurement of 4.6 cm. No change since 2018. Mediastinum/Nodes: Stable large hiatal hernia with a good portion of the stomach up in the chest. No mediastinal or hilar mass or adenopathy. Lungs/Pleura: No acute pulmonary findings. No worrisome pulmonary lesions. No pleural effusions or pleural lesions. Musculoskeletal: Severe scoliosis but no acute bony findings. CT ABDOMEN PELVIS FINDINGS Hepatobiliary: No hepatic lesions are identified without contrast. No intrahepatic biliary dilatation. No common bile duct dilatation.  Pancreas: Pancreatic atrophy but no mass or evidence of acute inflammation. Spleen: Normal size. No focal lesions. Adrenals/Urinary Tract: Adrenal glands and kidneys are grossly normal. No renal or obstructing ureteral calculi. The bladder is grossly normal. Stomach/Bowel: Stomach, duodenum, small bowel and colon grossly normal. Vascular/Lymphatic: Stable atherosclerotic calcifications involving the aorta and iliac arteries but no aneurysm. No mesenteric or retroperitoneal mass or adenopathy. Reproductive: Vaginal pessary in place. Small uterus. Other: No pelvic mass or adenopathy. No free pelvic fluid collections. No inguinal mass or adenopathy. No abdominal wall hernia or subcutaneous lesions. Musculoskeletal: Stable sclerotic lesion involving the right iliac bone, likely benign bone island. No acute bony findings. Severe scoliosis and numerous lower thoracic and lumbar compression fractures with vertebral augmentation changes. IMPRESSION: 1. Stable 4.6 cm ascending thoracic aortic aneurysm. No change since 2018. Ascending thoracic aortic aneurysm. Recommend semi-annual imaging followup by CTA or MRA and referral to cardiothoracic surgery if not already obtained. This recommendation follows 2010 ACCF/AHA/AATS/ACR/ASA/SCA/SCAI/SIR/STS/SVM Guidelines for the Diagnosis and Management of Patients With Thoracic Aortic Disease. Circulation. 2010; 121: A630-Z601. Aortic aneurysm NOS (ICD10-I71.9) 2. Stable large hiatal hernia. 3. No acute abdominal/pelvic findings, mass lesions or adenopathy. 4. Atherosclerotic changes involving the abdominal aorta but no aneurysm. 5. Severe scoliosis and associated degenerative spine disease. Multiple lower thoracic and lumbar spine compression fractures with vertebral augmentation changes. Aortic Atherosclerosis (ICD10-I70.0). Electronically Signed   By: Marijo Sanes M.D.   On: 11/22/2022 14:50   DG Chest 2 View  Result Date: 11/22/2022 CLINICAL DATA:  Rib pain.  Worse with  inspiration  EXAM: CHEST - 2 VIEW COMPARISON:  10/10/2022 and older x-ray series FINDINGS: Film is rotated to left the patient is tilted. Enlarged cardiopericardial silhouette. Calcified tortuous aorta. No consolidation, pneumothorax or effusion. Fixation hardware along the lower cervical spine at the edge of the imaging field. There is kyphosis centered over the lower thoracic spine with multilevel augmentation cement along compressed vertebral bodies. Please correlate with clinical history. Surgical clips overlie the right hemithorax. There is also a double density overlying the heart with air consistent with a hiatal hernia. IMPRESSION: Rotated and tilted radiograph. Enlarged cardiopericardial silhouette with a hiatal hernia. No consolidation, effusion or pneumothorax. The rib fractures identified on the prior study are less well identified on this current exam Electronically Signed   By: Jill Side M.D.   On: 11/22/2022 11:28    EKG: Independently reviewed.  Sinus rhythm, no acute ST changes.  Assessment/Plan Principal Problem:   CHF (congestive heart failure) (HCC) Active Problems:   Bilateral leg edema   Chronic diastolic CHF (congestive heart failure) (HCC)   Abdominal pain  (please populate well all problems here in Problem List. (For example, if patient is on BP meds at home and you resume or decide to hold them, it is a problem that needs to be her. Same for CAD, COPD, HLD and so on)  Acute on chronic HFpEF decompensation -Significant fluid overload -Responded very well to diuresis in the ED and put out about 1 L after 20 mg IV Lasix injection -Continue IV Lasix 20 mg IV twice daily -Echocardiogram was done less than 6 months ago, will not repeat at this time  Intractable abdominal pain -No clear etiology, appears to be musculoskeletal type of pain given the character of relation with torso movement and tenderness on palpation -Try capsicin cream -Patient declined steroid -CT with  IV contrast contraindicated as patient reported anaphylaxis type of allergy with IV dye in the past. -Will also try MiraLAX due to address constipation  Ascending thoracic aortic aneurysm -Appears to be stable on CT abdomen despite noncontrast -Patient aware she is not a candidate for surgical correction and she is DNR -BP heart rate fairly controlled  PAF -In sinus rhythm, continue Cardizem and renal dosed Eliquis  HTN -Stable, continue Cardizem  DVT prophylaxis: Eliquis Code Status: DNR Family Communication: None at bedside Disposition Plan: Patient is sick with significant CHF decompensation failed outpatient diuresis, requiring inpatient IV diuresis, expect more than 2 midnight hospital stay. Consults called: None Admission status: Tele admit   Lequita Halt MD [facility] Pager [phone]  11/22/2022, 5:04 PM

## 2022-11-22 NOTE — ED Provider Triage Note (Signed)
Emergency Medicine Provider Triage Evaluation Note  Thatiana Renbarger , a 87 y.o. female  was evaluated in triage.  Pt complains of continued abdominal/rib pain.  Patient was seen for the same complaint on January 6 at the emergency department.  A CT abdomen pelvis was performed at that visit which showed no acute findings but did show some signs of possible constipation. Patient states that since going home she has continued to have pain.  She states that she had a bowel movement yesterday which provided no relief of symptoms.  She now complains that the pain feels more like it is in in the lower rib region is worse with deep breathing.  Patient also has concerns about her bilateral lower extremity swelling Patient denies chest pain, shortness of breath  Review of Systems  Positive: As above Negative: As above  Physical Exam  BP (!) 145/76 (BP Location: Left Arm)   Pulse 75   Resp 14   Ht '4\' 11"'$  (1.499 m)   Wt 46.7 kg   SpO2 100%   BMI 20.80 kg/m  Gen:   Awake, no distress   Resp:  Normal effort  MSK:   Moves extremities without difficulty  Other:  No abdominal tenderness to palpation  Medical Decision Making  Medically screening exam initiated at 10:59 AM.  Appropriate orders placed.  [name] was informed that the remainder of the evaluation will be completed by another provider, this initial triage assessment does not replace that evaluation, and the importance of remaining in the ED until their evaluation is complete.     Dorothyann Peng, PA-C 11/22/22 1103

## 2022-11-23 DIAGNOSIS — Z86718 Personal history of other venous thrombosis and embolism: Secondary | ICD-10-CM | POA: Diagnosis not present

## 2022-11-23 DIAGNOSIS — I480 Paroxysmal atrial fibrillation: Secondary | ICD-10-CM

## 2022-11-23 DIAGNOSIS — I10 Essential (primary) hypertension: Secondary | ICD-10-CM

## 2022-11-23 DIAGNOSIS — I5032 Chronic diastolic (congestive) heart failure: Secondary | ICD-10-CM

## 2022-11-23 DIAGNOSIS — E876 Hypokalemia: Secondary | ICD-10-CM

## 2022-11-23 LAB — BASIC METABOLIC PANEL
Anion gap: 11 (ref 5–15)
BUN: 24 mg/dL — ABNORMAL HIGH (ref 8–23)
CO2: 26 mmol/L (ref 22–32)
Calcium: 8.8 mg/dL — ABNORMAL LOW (ref 8.9–10.3)
Chloride: 98 mmol/L (ref 98–111)
Creatinine, Ser: 0.69 mg/dL (ref 0.44–1.00)
GFR, Estimated: >60 mL/min (ref >60)
Glucose, Bld: 100 mg/dL — ABNORMAL HIGH (ref 70–99)
Potassium: 3.4 mmol/L — ABNORMAL LOW (ref 3.5–5.1)
Sodium: 135 mmol/L (ref 135–145)

## 2022-11-23 MED ORDER — DILTIAZEM HCL-DEXTROSE 125-5 MG/125ML-% IV SOLN (PREMIX)
5.0000 mg/h | INTRAVENOUS | Status: DC
  Administered 2022-11-23: 10 mg/h via INTRAVENOUS
  Administered 2022-11-23: 5 mg/h via INTRAVENOUS
  Filled 2022-11-23: qty 125

## 2022-11-23 MED ORDER — METOPROLOL TARTRATE 5 MG/5ML IV SOLN
5.0000 mg | Freq: Once | INTRAVENOUS | Status: AC
  Administered 2022-11-23: 2.5 mg via INTRAVENOUS
  Filled 2022-11-23: qty 5

## 2022-11-23 MED ORDER — POTASSIUM CHLORIDE CRYS ER 20 MEQ PO TBCR
40.0000 meq | EXTENDED_RELEASE_TABLET | ORAL | Status: AC
  Administered 2022-11-23 (×2): 40 meq via ORAL
  Filled 2022-11-23 (×2): qty 2

## 2022-11-23 MED ORDER — PANTOPRAZOLE SODIUM 40 MG PO TBEC
40.0000 mg | DELAYED_RELEASE_TABLET | Freq: Two times a day (BID) | ORAL | Status: DC
  Administered 2022-11-23 – 2022-12-03 (×20): 40 mg via ORAL
  Filled 2022-11-23 (×20): qty 1

## 2022-11-23 MED ORDER — SUCRALFATE 1 GM/10ML PO SUSP
1.0000 g | Freq: Three times a day (TID) | ORAL | Status: DC
  Administered 2022-11-23 – 2022-12-02 (×33): 1 g via ORAL
  Filled 2022-11-23 (×35): qty 10

## 2022-11-23 MED ORDER — ACETAMINOPHEN 325 MG PO TABS
650.0000 mg | ORAL_TABLET | Freq: Four times a day (QID) | ORAL | Status: DC | PRN
  Administered 2022-11-23 – 2022-12-02 (×10): 650 mg via ORAL
  Filled 2022-11-23 (×15): qty 2

## 2022-11-23 MED ORDER — SODIUM CHLORIDE 0.9 % IV BOLUS
250.0000 mL | Freq: Once | INTRAVENOUS | Status: AC
  Administered 2022-11-23: 250 mL via INTRAVENOUS

## 2022-11-23 MED ORDER — BISACODYL 5 MG PO TBEC
10.0000 mg | DELAYED_RELEASE_TABLET | Freq: Once | ORAL | Status: AC
  Administered 2022-11-23: 10 mg via ORAL
  Filled 2022-11-23: qty 2

## 2022-11-23 NOTE — Assessment & Plan Note (Addendum)
Patient will continue with losartan for blood pressure control.

## 2022-11-23 NOTE — Assessment & Plan Note (Signed)
Continue anticoagulation with apixaban,  

## 2022-11-23 NOTE — Progress Notes (Signed)
   11/23/22 0930  Assess: MEWS Score  BP (!) 81/68  MAP (mmHg) 74  Pulse Rate (!) 152  ECG Heart Rate (!) 156  Resp (!) 27  SpO2 95 %  Assess: MEWS Score  MEWS Temp 0  MEWS Systolic 1  MEWS Pulse 3  MEWS RR 2  MEWS LOC 0  MEWS Score 6  MEWS Score Color Red  Assess: if the MEWS score is Yellow or Red  Were vital signs taken at a resting state? Yes  Focused Assessment Change from prior assessment (see assessment flowsheet)  Does the patient meet 2 or more of the SIRS criteria? No  MEWS guidelines implemented *See Row Information* Yes  Treat  MEWS Interventions Administered prn meds/treatments  Pain Scale 0-10  Pain Score 5  Pain Type Acute pain  Pain Location Abdomen  Pain Orientation Left  Take Vital Signs  Increase Vital Sign Frequency  Red: Q 1hr X 4 then Q 4hr X 4, if remains red, continue Q 4hrs  Escalate  MEWS: Escalate Red: discuss with charge nurse/RN and provider, consider discussing with RRT  Notify: Charge Nurse/RN  Name of Charge Nurse/RN Notified Beverlee Nims  Date Charge Nurse/RN Notified 11/23/22  Time Charge Nurse/RN Notified 0930  Provider Notification  Provider Name/Title Arrien  Date Provider Notified 11/23/22  Time Provider Notified 0930  Method of Notification Page  Notification Reason Change in status  Provider response En route  Date of Provider Response 11/23/22  Time of Provider Response 0935  Notify: Rapid Response  Name of Rapid Response RN Notified Shelby  Date Rapid Response Notified 11/23/22  Time Rapid Response Notified 0935  Document  Patient Outcome Stabilized after interventions  Progress note created (see row info) Yes  Assess: SIRS CRITERIA  SIRS Temperature  0  SIRS Pulse 1  SIRS Respirations  1  SIRS WBC 0  SIRS Score Sum  2   Noted patient heart rate elevated, went to room and assessed patient complaining of abdominal pain radiating left side notified MD, charge nurse, and rapid response, got stat EKG, MD placed orders started  diltiazem drip MD stayed at bedside, ordered metoprolol '5mg'$  IV and he changed to 2.5 at bedside. 2.'5mg'$  given per MD order, heart rate dropped to 106, recheck bp every half and as needed, will stay on telemetry. Spoke to patient POA, her sister. Patient remains on unit.

## 2022-11-23 NOTE — Progress Notes (Signed)
Heart Failure Navigator Progress Note  Assessed for Heart & Vascular TOC clinic readiness.  Patient EF 60-65%, has a close hospital follow up with [facility] on 12/12/22.   Navigator will sign off at this time.   Earnestine Leys, BSN, Clinical cytogeneticist Only

## 2022-11-23 NOTE — Progress Notes (Addendum)
   Heart Failure Stewardship Pharmacist Progress Note   PCP: Corliss Blacker, MD PCP-Cardiologist: Freada Bergeron, MD    HPI:  87 yo F with PMH of CHF, DVT, afib, HTN, HLT, thoracic aneurysm, pulmonary hypertension, severe TR, hypothyroidism, and chronic back pain.  She presented to the ED on 1/11 with abdominal pain and LE edema. Recently finished antibiotic therapy for UTI that was diagnosed on 1/6. CXR with enlarged cardiopericardial silhouette with a hiatal hernia. Last ECHO done 06/05/22 showed LVEF 60-65%, no regional wall motion abnormalities, severe asymmetric LVH, RV normal, severe MR, and severe TR. DVT scan negative.   Current HF Medications: Diuretic: furosemide 20 mg IV BID ACE/ARB/ARNI: losartan 25 mg daily  Prior to admission HF Medications: Diuretic: furosemide 20 mg every MWF ACE/ARB/ARNI: losartan 25 mg daily  Pertinent Lab Values: Serum creatinine 0.69, BUN 24, Potassium 3.4, Sodium 135  Vital Signs: Weight: 103 lbs (admission weight: 104 lbs) Blood pressure: 110/80s  Heart rate: 70s  I/O: -2.4L yesterday; net -2.9L  Medication Assistance / Insurance Benefits Check: Does the patient have prescription insurance?  Yes Type of insurance plan: [place] state health plan  Outpatient Pharmacy:  Prior to admission outpatient pharmacy: [facility] Is the patient willing to use [facility] pharmacy at discharge? Yes Is the patient willing to transition their outpatient pharmacy to utilize a [facility] outpatient pharmacy?   Pending    Assessment: 1. Acute on chronic diastolic CHF (LVEF 79-72%). NYHA class II symptoms. - Continue furosemide 20 mg IV BID. Strict I/Os and daily weights. Keep K>4, replacing with 80 mEq today. Check magnesium in AM. - BP labile - consider adding spironolactone once stabilizes - Consider starting Farxiga 10 mg daily prior to discharge   Plan: 1) Medication changes recommended at this time: - Start Farxiga 10 mg daily  2)  Patient assistance: - Can use monthly copay cards for meds - copay card for [facility] lowers to $0 per month  3)  Education  - To be completed prior to discharge  Kerby Nora, PharmD, BCPS Heart Failure Cytogeneticist Phone [phone]

## 2022-11-23 NOTE — Assessment & Plan Note (Addendum)
Hyponatremia,   Follow up renal function in am. Patient will continue furosemide every other day at home. Will give one dose today, to keep fluid balance negative.

## 2022-11-23 NOTE — Hospital Course (Signed)
Mrs. Karan was admitted to the hospital with the working diagnosis of symptomatic hiatal hernia, complicated with atrial fibrillation with RVR.   87 yo female with the past medical history of paroxysmal atrial fibrillation, hypertension, heart failure, thoracic aortic aneurysm, pulmonary hypertension, hiatal hernia and chronic back pain who presented with abdominal pain. Patient had 2 weeks of abdominal pain, upper region, epigastric, associated with nausea but not vomiting. Positive constipation. ED visit 5 days ago, diagnosed with UTI and constipation and discharged home. Unfortunately she continue not feeling well after completing antibiotic, prompting her to come back to the ED.  On her physical examination blood pressure 140/83, HR76, RR 14, 02 saturation 99%, lungs with no wheezing or rales, heart with S1 and S2 present and rhythmic, abdomen with tenderness to deep palpation, no guarding or rebound, no distention, no lower extremity edema.   Na 132, K 4,0 CL 99, bicarbonate at 23, glucose 130 bun 28 cr 0,75  BNP 303  Wbc 8,1 hgb 12.8 plt 186   Urine analysis SG 1,014, protein negative, 21-50 wbc.   Chest radiograph with hyperinflation, no infiltrates or effusion, mild cardiomegaly, significant scoliosis and lateral film with significant kyphosis.   EKG 77 bpm, left axis deviation, normal intervals, sinus rhythm with no significant ST segment or T wave changes. Noisy baseline.   Patient was placed on IV furosemide for volume overload. Analgesics as needed.   01/12 developed atrial fibrillation with RVR and required diltiazem drip.   01/13 converted to sinus rhythm, her abdominal pain has improved with antiacids and anti reflux measures. Continue to be very weak and deconditioned.  01/14 patient clinically improved. 01/15 plan to discharge today but she only has home assistance in the morning, will plan to discharge tomorrow morning.  01/16 her discharge has been cancelled due to atrial  fibrillation with RVR.  Possible discharge on 01/17

## 2022-11-23 NOTE — Assessment & Plan Note (Addendum)
Patient required IV diltiazem and IV metoprolol to control rapid ventricular response atrial fibrillation.  Patient now converted to sinus rhythm, back on her regular dose of diltiazem, but then been back on atrial fibrillation, since last evening.  Plan to continue diltiazem and will add metoprolol,and continue telemetry monitoring for the next 24 hrs.  Plan to continue anticoagulation with apixaban.

## 2022-11-23 NOTE — Assessment & Plan Note (Addendum)
Patient has a large hiatal hernia with significant reflux, triggering abdominal pain and nausea.    Patient was placed on pantoprazole and sucralfate, along with anti reflux measures (small bites and seating when eating), with improving of her symptoms.  She is tolerating po well and has no nausea or vomiting.   Patient had positive bowel movements during her hospitalization, at home will resume her bowel regimen and dulcolax has been added.   Patient with severe kyphosis, patient prone to reflux.  Continue with home health services.

## 2022-11-23 NOTE — Progress Notes (Signed)
Progress Note   Patient: [name] HWE:[id number] DOB: [birth year] DOA: 11/22/2022     1 DOS: the patient was seen and examined on 11/23/2022   Brief hospital course: [name] was admitted to the hospital with the working diagnosis of abdominal pain, complicated with atrial fibrillation with RVR.   87 yo female with the past medical history of paroxysmal atrial fibrillation, hypertension, heart failure, thoracic aortic aneurysm, pulmonary hypertension and chronic back pain who presented with abdominal pain. Patient had 2 weeks of abdominal pain, upper region, epigastric, associated with nausea but not vomiting. Positive constipation. ED visit 5 days ago, diagnosed with UTI and constipation.Unfortunately she continue not feeling well after completing antibiotic, prompting her to come back to the ED.  On her physical examination blood pressure 140/83, HR76, RR 14, 02 saturation 99%, lungs with no wheezing or rales, heart with S1 and S2 present and rhythmic, abdomen with tenderness to deep palpation, no guarding or rebound, no distention, no lower extremity edema.   Na 132, K 4,0 CL 99, bicarbonate at 23, glucose 130 bun 28 cr 0,75  BNP 303  Wbc 8,1 hgb 12.8 plt 186   Urine analysis SG 1,014, protein negative, 21-50 wbc.   Chest radiograph with hyperinflation, no infiltrates or effusion, mild cardiomegaly, significant scoliosis and lateral film with significant kyphosis.   EKG 77 bpm, left axis deviation, normal intervals, sinus rhythm with no significant ST segment or T wave changes. Noisy baseline.   Patient was placed on IV furosemide for volume overload. Analgesics as needed.   01/12 developed atrial fibrillation with RVR and required diltiazem drip.     Assessment and Plan: * Abdominal pain Positive constipation, and large hiatal hernia.  Plan to continue supportive medical therapy with antiacid and analgesics Add sucralfate.  Out of bed as tolerated.  PT and OT Add  dulcolax and continue with Miralax for bowel regimen.   PAF (paroxysmal atrial fibrillation) ([place]) Patient developed atrial fibrillation with RVR Required metoprolol 2,5 mg IV x1 and started on diltiazem infusion.   Plan to continue oral diltiazem Continue telemetry monitoring   Chronic diastolic CHF (congestive heart failure) ([place]) This am with improvement in volume status, will plan to hold on diuresis and follow up in am.   Essential hypertension Continue blood pressure monitoring She had hypotension this am and required IV saline bolus 250 cc Will hold on losartan for now.  History of DVT of lower extremity Continue anticoagulation with apixaban.,   Hypokalemia Follow up renal function with serum cr at 0,69, K is 3,4 and serum bicarbonate at 26.  Will add 40 meq Kcl x2 and continue follow up renal function in am.         Subjective: Patient not feeling well at the time of my examination. Positive chest pain, dyspnea and palpitations   Physical Exam: Vitals:   11/23/22 1200 11/23/22 1230 11/23/22 1300 11/23/22 1330  BP: (!) '87/71 97/79 94/76 '$ 101/83  Pulse: 99 (!) 47 (!) 114 71  Resp: (!) 32 17 (!) 22 20  Temp:      TempSrc:      SpO2: 97% 96% 99% 100%  Weight:      Height:       Neurology awake and alert, ill looking appearing and in pain  ENT with mild pallor Cardiovascular with S1 and S2 present irregularly irregular with no gallops or murmurs Respiratory with scattered rhonchi, and bilateral distant breath sounds with prolonged expiratory phase Abdomen with mild distention,  but not tender to palpation  No lower extremity edema  Data Reviewed:    Family Communication: no family at the bedside   Disposition: Status is: Inpatient Remains inpatient appropriate because: atrial fibrillation with diltiazem drip   Planned Discharge Destination: Home    Author: Tawni Millers, MD 11/23/2022 4:13 PM  For on call review [email or web address].

## 2022-11-23 NOTE — Assessment & Plan Note (Addendum)
05/2022 echocardiogram with preserved LV systolic function with EF 60 to 65%, severe asymmetric ventricular hypertrophy of the basal septal segment. RV systolic function is preserved. Severe dilatation of LA and RA.  Severe mitral valve regurgitation. Severe TR regurgitation.   Patient received IV furosemide, negative fluid balance was achieved, - 4,602 since admission, with significant improvement in her symptoms.  At home will continue regular dose of furosemide every other day.   Continue close blood pressure monitoring.

## 2022-11-24 DIAGNOSIS — I5032 Chronic diastolic (congestive) heart failure: Secondary | ICD-10-CM | POA: Diagnosis not present

## 2022-11-24 DIAGNOSIS — K449 Diaphragmatic hernia without obstruction or gangrene: Secondary | ICD-10-CM

## 2022-11-24 DIAGNOSIS — I10 Essential (primary) hypertension: Secondary | ICD-10-CM | POA: Diagnosis not present

## 2022-11-24 DIAGNOSIS — I480 Paroxysmal atrial fibrillation: Secondary | ICD-10-CM | POA: Diagnosis not present

## 2022-11-24 LAB — BASIC METABOLIC PANEL
Anion gap: 7 (ref 5–15)
BUN: 26 mg/dL — ABNORMAL HIGH (ref 8–23)
CO2: 27 mmol/L (ref 22–32)
Calcium: 8.7 mg/dL — ABNORMAL LOW (ref 8.9–10.3)
Chloride: 98 mmol/L (ref 98–111)
Creatinine, Ser: 0.77 mg/dL (ref 0.44–1.00)
GFR, Estimated: >60 mL/min (ref >60)
Glucose, Bld: 108 mg/dL — ABNORMAL HIGH (ref 70–99)
Potassium: 4.8 mmol/L (ref 3.5–5.1)
Sodium: 132 mmol/L — ABNORMAL LOW (ref 135–145)

## 2022-11-24 MED ORDER — SIMETHICONE 80 MG PO CHEW
80.0000 mg | CHEWABLE_TABLET | Freq: Four times a day (QID) | ORAL | Status: DC | PRN
  Administered 2022-11-24 – 2022-12-03 (×6): 80 mg via ORAL
  Filled 2022-11-24 (×6): qty 1

## 2022-11-24 MED ORDER — MORPHINE SULFATE (PF) 2 MG/ML IV SOLN
1.0000 mg | INTRAVENOUS | Status: DC | PRN
  Administered 2022-11-24 – 2022-11-25 (×2): 1 mg via INTRAVENOUS
  Filled 2022-11-24 (×2): qty 1

## 2022-11-24 MED ORDER — BISACODYL 5 MG PO TBEC
10.0000 mg | DELAYED_RELEASE_TABLET | Freq: Once | ORAL | Status: DC
  Filled 2022-11-24: qty 2

## 2022-11-24 NOTE — Progress Notes (Signed)
Patient HR has been below 60s. BP has been soft as well (90/66). MD notified via secure chat. Cardizem drip discontinued as ordered. Will continue to monitor.

## 2022-11-24 NOTE — Evaluation (Signed)
Occupational Therapy Evaluation Patient Details Name: [name] MRN: [id number] DOB: 1936-04-11 Today's Date: 11/24/2022   History of Present Illness Pt is an 87 y/o F presenting to ED on 1/11 with abdominal pain, recent ED visit 5 days ago with UTI dx. Admitted for hiatal hernia. PMH includes CHF, A fib, HTN,thoracic aortic aneurysm, pulmonary HTN, and chronic back pain.   Clinical Impression   Pt reports using 4WW for mobility, has PRN assistance for ADLs from aide that comes weekdays 9am-1pm, otherwise pt lives alone. Pt currently needing supervision-mod A for ADLs and min guard for short in-room ambulation with use of RW. Pt primarily limited by 8/10 abdominal pain during session. Pt presenting with impairments listed below, will follow acutely. Recommend HHOT at d/c.     Recommendations for follow up therapy are one component of a multi-disciplinary discharge planning process, led by the attending physician.  Recommendations may be updated based on patient status, additional functional criteria and insurance authorization.   Follow Up Recommendations  Home health OT     Assistance Recommended at Discharge Intermittent Supervision/Assistance  Patient can return home with the following A little help with walking and/or transfers;Assistance with cooking/housework;Direct supervision/assist for medications management;Direct supervision/assist for financial management;Assist for transportation;Help with stairs or ramp for entrance;A lot of help with bathing/dressing/bathroom    Functional Status Assessment  Patient has had a recent decline in their functional status and demonstrates the ability to make significant improvements in function in a reasonable and predictable amount of time.  Equipment Recommendations  None recommended by OT (pt has all needed DME)    Recommendations for Other Services PT consult     Precautions / Restrictions Restrictions Weight Bearing  Restrictions: No      Mobility Bed Mobility               General bed mobility comments: OOB in chair upon arrival    Transfers Overall transfer level: Needs assistance Equipment used: Rolling walker (2 wheels) Transfers: Sit to/from Stand, Bed to chair/wheelchair/BSC Sit to Stand: Min guard     Step pivot transfers: Min guard     General transfer comment: increased time and cues for sequencing      Balance Overall balance assessment: Needs assistance Sitting-balance support: Bilateral upper extremity supported, Feet supported Sitting balance-Leahy Scale: Fair     Standing balance support: During functional activity, Reliant on assistive device for balance Standing balance-Leahy Scale: Poor Standing balance comment: reliant on RW support                           ADL either performed or assessed with clinical judgement   ADL Overall ADL's : Needs assistance/impaired Eating/Feeding: Set up;Sitting   Grooming: Set up;Standing   Upper Body Bathing: Moderate assistance;Sitting   Lower Body Bathing: Moderate assistance;Sitting/lateral leans   Upper Body Dressing : Moderate assistance;Sitting   Lower Body Dressing: Maximal assistance;Sit to/from stand   Toilet Transfer: Land (4 wheels)   Toileting- Water quality scientist and Hygiene: Supervision/safety       Functional mobility during ADLs: Engineer, manufacturing (4 wheels)       Vision Baseline Vision/History: 1 Wears glasses Additional Comments: appears baseline     Agricultural engineer Tested?: No   Praxis Praxis Praxis tested?: Not tested    Pertinent Vitals/Pain Pain Assessment Pain Assessment: Faces Pain Score: 8  Faces Pain Scale: Hurts whole lot Pain Location: abdomen Pain Descriptors / Indicators: Discomfort, Constant  Pain Intervention(s): Limited activity within patient's tolerance, Monitored during session, Premedicated before  session     Hand Dominance     Extremity/Trunk Assessment Upper Extremity Assessment Upper Extremity Assessment: Generalized weakness   Lower Extremity Assessment Lower Extremity Assessment: Defer to PT evaluation   Cervical / Trunk Assessment Cervical / Trunk Assessment: Kyphotic;Other exceptions (scoliosis)   Communication Communication Communication: No difficulties   Cognition Arousal/Alertness: Awake/alert Behavior During Therapy: Restless, Anxious Overall Cognitive Status: Within Functional Limits for tasks assessed                                 General Comments: aware of reason for admission, follows commands appropriately, provides PLOF appropriately     General Comments  VSS on RA    Exercises     Shoulder Instructions      Home Living Family/patient expects to be discharged to:: Private residence Living Arrangements: Alone Available Help at Discharge: Personal care attendant (9-1 weedays) Type of Home: House Home Access: [place]: One level     Bathroom Shower/Tub: Hospital doctor Toilet: Handicapped height     [place]: BSC/3in1;Shower [place] (2 wheels);Hand held shower head          Prior Functioning/Environment Prior Level of Function : Independent/Modified Independent             Mobility Comments: uses 4WW ADLs Comments: PRN assist from aide for IADLs/ADLs; aide drives for pt        OT Problem List: Decreased strength;Decreased range of motion;Decreased activity tolerance;Impaired balance (sitting and/or standing);Decreased cognition;Decreased safety awareness      OT Treatment/Interventions: Self-care/ADL training;Therapeutic exercise;DME and/or AE instruction;Energy conservation;Therapeutic activities;Visual/perceptual remediation/compensation;Patient/family education    OT Goals(Current goals can be found in the care plan section) Acute Rehab OT Goals Patient  Stated Goal: none stated OT Goal Formulation: With patient Time For Goal Achievement: 12/08/22 Potential to Achieve Goals: Good ADL Goals Pt Will Perform Upper Body Dressing: with supervision;sitting Pt Will Perform Lower Body Dressing: with supervision;sitting/lateral leans;sit to/from stand Pt Will Transfer to Toilet: with supervision;ambulating;regular height toilet  OT Frequency: Min 2X/week    Co-evaluation              AM-PAC OT "6 Clicks" Daily Activity     Outcome Measure Help from another person eating meals?: None Help from another person taking care of personal grooming?: A Little Help from another person toileting, which includes using toliet, bedpan, or urinal?: A Little Help from another person bathing (including washing, rinsing, drying)?: A Lot Help from another person to put on and taking off regular upper body clothing?: A Lot Help from another person to put on and taking off regular lower body clothing?: A Lot 6 Click Score: 16   End of Session Equipment Utilized During Treatment: Gait belt;Rolling walker (2 wheels) Nurse Communication: Mobility status  Activity Tolerance: Patient tolerated treatment well Patient left: in chair;with call bell/phone within reach;with chair alarm set  OT Visit Diagnosis: Unsteadiness on feet (R26.81);Other abnormalities of gait and mobility (R26.89);Muscle weakness (generalized) (M62.81)                Time: 5188-4166 OT Time Calculation (min): 37 min Charges:  OT General Charges $OT Visit: 1 Visit OT Evaluation $OT Eval Moderate Complexity: 1 Mod OT Treatments $Self Care/Home Management : 8-22 mins  Renaye Rakers, OTD, OTR/L SecureChat Preferred Acute Rehab (  [phone]   Ulla Gallo 11/24/2022, 2:33 PM

## 2022-11-24 NOTE — Progress Notes (Signed)
Progress Note   Patient: [name] QAS:[id number] DOB: 1936/05/18 DOA: 11/22/2022     2 DOS: the patient was seen and examined on 11/24/2022   Brief hospital course: [name] was admitted to the hospital with the working diagnosis of abdominal pain, complicated with atrial fibrillation with RVR.   87 yo female with the past medical history of paroxysmal atrial fibrillation, hypertension, heart failure, thoracic aortic aneurysm, pulmonary hypertension and chronic back pain who presented with abdominal pain. Patient had 2 weeks of abdominal pain, upper region, epigastric, associated with nausea but not vomiting. Positive constipation. ED visit 5 days ago, diagnosed with UTI and constipation.Unfortunately she continue not feeling well after completing antibiotic, prompting her to come back to the ED.  On her physical examination blood pressure 140/83, HR76, RR 14, 02 saturation 99%, lungs with no wheezing or rales, heart with S1 and S2 present and rhythmic, abdomen with tenderness to deep palpation, no guarding or rebound, no distention, no lower extremity edema.   Na 132, K 4,0 CL 99, bicarbonate at 23, glucose 130 bun 28 cr 0,75  BNP 303  Wbc 8,1 hgb 12.8 plt 186   Urine analysis SG 1,014, protein negative, 21-50 wbc.   Chest radiograph with hyperinflation, no infiltrates or effusion, mild cardiomegaly, significant scoliosis and lateral film with significant kyphosis.   EKG 77 bpm, left axis deviation, normal intervals, sinus rhythm with no significant ST segment or T wave changes. Noisy baseline.   Patient was placed on IV furosemide for volume overload. Analgesics as needed.   01/12 developed atrial fibrillation with RVR and required diltiazem drip.   01/13 converted to sinus rhythm, her abdominal pain has improved. Continue to be very weak and deconditioned.   Assessment and Plan: * Hiatal hernia Her symptoms have improved with antiacid therapy, no nausea or vomiting.    Continue with pantoprazole and sucralfate.  Out of bed as tolerated.  PT and OT Bowel regimen with Miralax and will repeat dose of dulcolax.   PAF (paroxysmal atrial fibrillation) ([place]) Patient converted to sinus rhythm and diltiazem drip has been discontinued. Plan to continue diltiazem 180 mg daily Continue telemetry monitoring. Anticoagulation with apixaban.  Out of bed to chair, PT and OT.   Chronic diastolic CHF (congestive heart failure) ([place]) 05/2022 echocardiogram with preserved LV systolic function with EF 60 to 65%, severe asymmetric ventricular hypertrophy of the basal septal segment. RV systolic function is preserved. Severe dilatation of LA and RA.  Severe mitral valve regurgitation. Severe TR regurgitation.   Volume status has improved. Systolic blood pressure 798 to 130 mmHg.  Her volume status is negative 3,462 ml since admission.   Plan to continue to hold on furosemide Continue close blood pressure monitoring.   Essential hypertension Blood pressure has improved. Continue to hold on losartan for now.   History of DVT of lower extremity Continue anticoagulation with apixaban.,   Hypokalemia Hyponatremia,   Renal function with serum cr at 0,7 with K at 4,8 and serum bicarbonate at 27. Na 132.   Plan to continue to hold on furosemide and follow up renal function and electrolytes in am.         Subjective: Patient with improvement in abdominal pain, continue to be very weak and deconditioned. No chest pain or dyspnea   Physical Exam: Vitals:   11/23/22 2300 11/24/22 0002 11/24/22 0322 11/24/22 1319  BP:  90/66 100/70 130/87  Pulse: 69 65 65 83  Resp: (!) '21 17 15 '$ 20  Temp:   98.7 F (37.1 C)   TempSrc:   Oral   SpO2:  94% 95% 96%  Weight:   46.5 kg   Height:       Neurology awake and alert, ill looking appearing and deconditioned ENT with mild pallor Cardiovascular with S1 and S2 present and rhythmic with no gallops, rubs or  murmurs Respiratory with no rales or wheezing, no rhonchi Abdomen with mild distention with no tenderness.  No lower extremity edema.  Data Reviewed:    Family Communication: no family at the bedside   Disposition: Status is: Inpatient Remains inpatient appropriate because: telemetry monitoring, monitor po intake and pending PT and OT evaluation.   Planned Discharge Destination: Home possible SNF      Author: Tawni Millers, MD 11/24/2022 1:23 PM  For on call review [email or web address].

## 2022-11-24 NOTE — Progress Notes (Signed)
Patients sister, Criss Rosales [phone], would like to have an update on her sisters plan of care.

## 2022-11-25 DIAGNOSIS — K449 Diaphragmatic hernia without obstruction or gangrene: Secondary | ICD-10-CM | POA: Diagnosis not present

## 2022-11-25 DIAGNOSIS — I480 Paroxysmal atrial fibrillation: Secondary | ICD-10-CM | POA: Diagnosis not present

## 2022-11-25 DIAGNOSIS — I10 Essential (primary) hypertension: Secondary | ICD-10-CM | POA: Diagnosis not present

## 2022-11-25 DIAGNOSIS — I5032 Chronic diastolic (congestive) heart failure: Secondary | ICD-10-CM | POA: Diagnosis not present

## 2022-11-25 LAB — BASIC METABOLIC PANEL
Anion gap: 8 (ref 5–15)
BUN: 20 mg/dL (ref 8–23)
CO2: 25 mmol/L (ref 22–32)
Calcium: 8.6 mg/dL — ABNORMAL LOW (ref 8.9–10.3)
Chloride: 99 mmol/L (ref 98–111)
Creatinine, Ser: 0.77 mg/dL (ref 0.44–1.00)
GFR, Estimated: >60 mL/min (ref >60)
Glucose, Bld: 102 mg/dL — ABNORMAL HIGH (ref 70–99)
Potassium: 4.1 mmol/L (ref 3.5–5.1)
Sodium: 132 mmol/L — ABNORMAL LOW (ref 135–145)

## 2022-11-25 MED ORDER — ORAL CARE MOUTH RINSE: 15.0000 mL | OROMUCOSAL | Status: DC | PRN

## 2022-11-25 MED ORDER — BISACODYL 5 MG PO TBEC
10.0000 mg | DELAYED_RELEASE_TABLET | Freq: Once | ORAL | Status: AC
  Administered 2022-11-25: 10 mg via ORAL

## 2022-11-25 NOTE — Progress Notes (Signed)
Progress Note   Patient: [name] WNI:[id number] DOB: Jan 21, 1936 DOA: 11/22/2022     3 DOS: the patient was seen and examined on 11/25/2022   Brief hospital course: [name] was admitted to the hospital with the working diagnosis of abdominal pain, complicated with atrial fibrillation with RVR.   87 yo female with the past medical history of paroxysmal atrial fibrillation, hypertension, heart failure, thoracic aortic aneurysm, pulmonary hypertension and chronic back pain who presented with abdominal pain. Patient had 2 weeks of abdominal pain, upper region, epigastric, associated with nausea but not vomiting. Positive constipation. ED visit 5 days ago, diagnosed with UTI and constipation.Unfortunately she continue not feeling well after completing antibiotic, prompting her to come back to the ED.  On her physical examination blood pressure 140/83, HR76, RR 14, 02 saturation 99%, lungs with no wheezing or rales, heart with S1 and S2 present and rhythmic, abdomen with tenderness to deep palpation, no guarding or rebound, no distention, no lower extremity edema.   Na 132, K 4,0 CL 99, bicarbonate at 23, glucose 130 bun 28 cr 0,75  BNP 303  Wbc 8,1 hgb 12.8 plt 186   Urine analysis SG 1,014, protein negative, 21-50 wbc.   Chest radiograph with hyperinflation, no infiltrates or effusion, mild cardiomegaly, significant scoliosis and lateral film with significant kyphosis.   EKG 77 bpm, left axis deviation, normal intervals, sinus rhythm with no significant ST segment or T wave changes. Noisy baseline.   Patient was placed on IV furosemide for volume overload. Analgesics as needed.   01/12 developed atrial fibrillation with RVR and required diltiazem drip.   01/13 converted to sinus rhythm, her abdominal pain has improved. Continue to be very weak and deconditioned.  01/14 patient clinically improved possible discharge in the next 24 hrs.  Assessment and Plan: * Hiatal hernia Her  symptoms have improved with antiacid therapy, no nausea or vomiting.   Continue with pantoprazole and sucralfate.  Out of bed as tolerated.  PT and OT Bowel regimen with Miralax and will repeat dose of dulcolax.   PAF (paroxysmal atrial fibrillation) ([facility]) Continue rate control with diltiazem 180 mg daily Continue telemetry monitoring. Anticoagulation with apixaban.  Out of bed to chair, PT and OT.   Chronic diastolic CHF (congestive heart failure) ([place]) 05/2022 echocardiogram with preserved LV systolic function with EF 60 to 65%, severe asymmetric ventricular hypertrophy of the basal septal segment. RV systolic function is preserved. Severe dilatation of LA and RA.  Severe mitral valve regurgitation. Severe TR regurgitation.   Volume status has improved. Systolic blood pressure 381 to 150 mmHg.  Her volume status is negative 3,462 ml since admission.   Continue to hold on furosemide Continue close blood pressure monitoring.   Essential hypertension Blood pressure has improved. Continue to hold on losartan for now.   History of DVT of lower extremity Continue anticoagulation with apixaban.,   Hypokalemia Hyponatremia,   Renal function has been stable with serum cr at 0,77 with K at 4,1 and serum bicarbonate at 25. Na 132.  Plan to resume furosemide at the time of discharge.         Subjective: Patient with improvement in her symptoms, positive bowel movement and back pain better controlled,   Physical Exam: Vitals:   11/24/22 1829 11/24/22 2031 11/25/22 0523 11/25/22 0834  BP: (!) 141/91 124/81 130/86 (!) 157/89  Pulse:  76 62 80  Resp: '20 18 15 19  '$ Temp:  98 F (36.7 C) (!) 97.5  F (36.4 C) (!) 97.3 F (36.3 C)  TempSrc:  Oral Oral Oral  SpO2:  98% 97% 100%  Weight:      Height:       Neurology awake and alert  ENT with mild pallor Cardiovascular with S1 and S2 present with no gallops or murmurs Respiratory with no rales or wheezing Abdomen with no  distention  No lower extremity edema Severe kyphosis.  Data Reviewed:    Family Communication: no family at the bedside. I spoke with patient's daughter at the bedside, we talked in detail about patient's condition, plan of care and prognosis and all questions were addressed.    Disposition: Status is: Inpatient Remains inpatient appropriate because: clinically improving, possible discharge home in the next 24 hrs.   Planned Discharge Destination: Home   Author: Tawni Millers, MD 11/25/2022 3:29 PM  For on call review [email or web address].

## 2022-11-25 NOTE — Evaluation (Signed)
Physical Therapy Evaluation Patient Details Name: [name] MRN: [id number] DOB: 04-10-1936 Today's Date: 11/25/2022  History of Present Illness  Pt is an 87 y.o. female admitted 11/22/22 with abrominal pain; workup for hiatal hernia. Pt developed afib with RVR 1/12. Of note, recent ED visit 5 days prior with UTI dx. PMH includes CHF, afib, HTN, TAA, pulmonary HTN, chronic back pain.   Clinical Impression  Pt presents with an overall decrease in functional mobility secondary to above. PTA, pt mod indep with use of 4-wheeled rolling walker; pt has PCA assist 5x/wk for ADL/iADLs and transportation. Today, pt requiring up to minA for brief bouts of activity, max encouragement to ambulate this session. Educ re: abdominal precautions for comfort, activity recommendations, and importance of mobility. Pt would benefit from continued acute PT services to maximize functional mobility and independence prior to d/c with HHPT services.        Recommendations for follow up therapy are one component of a multi-disciplinary discharge planning process, led by the attending physician.  Recommendations may be updated based on patient status, additional functional criteria and insurance authorization.  Follow Up Recommendations Home health PT      Assistance Recommended at Discharge Intermittent Supervision/Assistance  Patient can return home with the following  A little help with walking and/or transfers;A little help with bathing/dressing/bathroom;Assistance with cooking/housework;Assist for transportation;Help with stairs or ramp for entrance    Equipment Recommendations None recommended by PT  Recommendations for Other Services       Functional Status Assessment Patient has had a recent decline in their functional status and demonstrates the ability to make significant improvements in function in a reasonable and predictable amount of time.     Precautions / Restrictions  Precautions Precautions: Fall;Other (comment) Precaution Comments: urinary frequency; initiated educ on abdominal prec for comfort Restrictions Weight Bearing Restrictions: No      Mobility  Bed Mobility Overal bed mobility: Modified Independent             General bed mobility comments: educ on log roll technique for abdominal comfort, pt ultimately mod indep to roll onto L-side and come to sitting from flat bed, use of bed rail    Transfers Overall transfer level: Needs assistance Equipment used:  (pt's 4-wheeled walker) Transfers: Sit to/from Stand, Bed to chair/wheelchair/BSC Sit to Stand: Min guard   Step pivot transfers: Min guard       General transfer comment: initial stand and pivotal steps from bed to [facility] with walker and min guard; pt with increased BUE shakiness standing from BSC, minA to steady walker    Ambulation/Gait Ambulation/Gait assistance: Min guard Gait Distance (Feet): 14 Feet Assistive device:  (pt's 4-wheeled walker) Gait Pattern/deviations: Step-through pattern, Decreased stride length, Trunk flexed Gait velocity: Decreased     General Gait Details: slow, guarded gait with walker and min guard for balance; pt declines further distance secondary to fatigue  Stairs            Wheelchair Mobility    Modified Rankin (Stroke Patients Only)       Balance Overall balance assessment: Needs assistance Sitting-balance support: Feet supported, No upper extremity supported Sitting balance-Leahy Scale: Fair Sitting balance - Comments: indep with pericare sitting on BSC   Standing balance support: During functional activity, Reliant on assistive device for balance Standing balance-Leahy Scale: Poor Standing balance comment: reliant on UE support  Pertinent Vitals/Pain Pain Assessment Pain Assessment: Faces Faces Pain Scale: Hurts little more Pain Location: abdomen Pain Descriptors / Indicators:  Discomfort, Guarding, Grimacing Pain Intervention(s): Monitored during session, Limited activity within patient's tolerance    Home Living Family/patient expects to be discharged to:: Private residence Living Arrangements: Alone Available Help at Discharge: Personal care attendant   Home Access: Ramped entrance       Home Layout: One level Home Equipment: BSC/3in1;Shower seat;Rolling Environmental consultant (2 wheels);Hand held shower head Additional Comments: PCA 5x/wk from 9A-1P    Prior Function Prior Level of Function : Independent/Modified Independent;Needs assist             Mobility Comments: mod indep ambulating with 4-wheeled rolling walker ADLs Comments: PRN assist from aide for IADLs/ADLs; aide drives for pt     Hand Dominance        Extremity/Trunk Assessment   Upper Extremity Assessment Upper Extremity Assessment: Generalized weakness    Lower Extremity Assessment Lower Extremity Assessment: Generalized weakness    Cervical / Trunk Assessment Cervical / Trunk Assessment: Kyphotic;Other exceptions Cervical / Trunk Exceptions: noted redness at 2x spots on kyphotic thoracic spine - requested RN place padded dressing to prevent skin breakdown  Communication   Communication: No difficulties  Cognition Arousal/Alertness: Awake/alert Behavior During Therapy: WFL for tasks assessed/performed Overall Cognitive Status: Within Functional Limits for tasks assessed                                 General Comments: WFL for simple tasks, not formally assessed. multiple complaints        General Comments General comments (skin integrity, edema, etc.): VSS on RA. initiated educ on abdominal precautions for comfort    Exercises     Assessment/Plan    PT Assessment Patient needs continued PT services  PT Problem List Decreased strength;Decreased activity tolerance;Decreased balance;Decreased mobility;Pain;Cardiopulmonary status limiting activity       PT  Treatment Interventions DME instruction;Gait training;Functional mobility training;Therapeutic activities;Therapeutic exercise;Balance training;Patient/family education    PT Goals (Current goals can be found in the Care Plan section)  Acute Rehab PT Goals Patient Stated Goal: return home with [place] services PT Goal Formulation: With patient Time For Goal Achievement: 12/09/22 Potential to Achieve Goals: Good    Frequency Min 3X/week     Co-evaluation               AM-PAC PT "6 Clicks" Mobility  Outcome Measure Help needed turning from your back to your side while in a flat bed without using bedrails?: None Help needed moving from lying on your back to sitting on the side of a flat bed without using bedrails?: None Help needed moving to and from a bed to a chair (including a wheelchair)?: A Little Help needed standing up from a chair using your arms (e.g., wheelchair or bedside chair)?: A Little Help needed to walk in hospital room?: Total Help needed climbing 3-5 steps with a railing? : Total 6 Click Score: 16    End of Session Equipment Utilized During Treatment: Gait belt Activity Tolerance: Patient tolerated treatment well;Patient limited by fatigue Patient left: in bed;with call bell/phone within reach (seated EOB awaiting RN to place back dressings) Nurse Communication: Mobility status PT Visit Diagnosis: Other abnormalities of gait and mobility (R26.89);Muscle weakness (generalized) (M62.81)    Time: 1455-1530 PT Time Calculation (min) (ACUTE ONLY): 35 min   Charges:   PT Evaluation $PT Eval Moderate  Complexity: 1 Mod PT Treatments $Therapeutic Activity: 8-22 mins       Mabeline Caras, PT, DPT Acute Rehabilitation Services  Personal: [facility] Office: [place] 11/25/2022, 4:15 PM

## 2022-11-26 ENCOUNTER — Other Ambulatory Visit (HOSPITAL_COMMUNITY): Payer: Self-pay

## 2022-11-26 DIAGNOSIS — I5032 Chronic diastolic (congestive) heart failure: Secondary | ICD-10-CM | POA: Diagnosis not present

## 2022-11-26 DIAGNOSIS — K449 Diaphragmatic hernia without obstruction or gangrene: Secondary | ICD-10-CM | POA: Diagnosis not present

## 2022-11-26 DIAGNOSIS — I10 Essential (primary) hypertension: Secondary | ICD-10-CM | POA: Diagnosis not present

## 2022-11-26 DIAGNOSIS — I480 Paroxysmal atrial fibrillation: Secondary | ICD-10-CM | POA: Diagnosis not present

## 2022-11-26 MED ORDER — PANTOPRAZOLE SODIUM 40 MG PO TBEC
DELAYED_RELEASE_TABLET | ORAL | 0 refills | Status: DC
  Filled 2022-11-26: qty 30, 15d supply, fill #0

## 2022-11-26 MED ORDER — ENSURE ENLIVE PO LIQD
237.0000 mL | Freq: Two times a day (BID) | ORAL | Status: DC
  Administered 2022-11-27 – 2022-12-01 (×5): 237 mL via ORAL

## 2022-11-26 MED ORDER — METOPROLOL TARTRATE 5 MG/5ML IV SOLN
5.0000 mg | Freq: Once | INTRAVENOUS | Status: AC
  Administered 2022-11-26: 5 mg via INTRAVENOUS
  Filled 2022-11-26: qty 5

## 2022-11-26 MED ORDER — SUCRALFATE 1 G PO TABS
1.0000 g | ORAL_TABLET | Freq: Three times a day (TID) | ORAL | 0 refills | Status: DC
  Filled 2022-11-26: qty 120, 30d supply, fill #0

## 2022-11-26 MED ORDER — ALPRAZOLAM 0.5 MG PO TABS
0.5000 mg | ORAL_TABLET | Freq: Once | ORAL | Status: AC
  Administered 2022-11-27: 0.5 mg via ORAL
  Filled 2022-11-26: qty 1

## 2022-11-26 MED ORDER — METOPROLOL TARTRATE 5 MG/5ML IV SOLN
2.5000 mg | Freq: Once | INTRAVENOUS | Status: AC
  Administered 2022-11-26: 2.5 mg via INTRAVENOUS
  Filled 2022-11-26: qty 5

## 2022-11-26 MED ORDER — BISACODYL 5 MG PO TBEC
5.0000 mg | DELAYED_RELEASE_TABLET | Freq: Every day | ORAL | 0 refills | Status: DC | PRN
  Filled 2022-11-26: qty 30, 30d supply, fill #0

## 2022-11-26 MED ORDER — ADULT MULTIVITAMIN W/MINERALS CH
1.0000 | ORAL_TABLET | Freq: Every day | ORAL | Status: DC
  Administered 2022-11-26 – 2022-12-03 (×8): 1 via ORAL
  Filled 2022-11-26 (×8): qty 1

## 2022-11-26 MED ORDER — ACETAMINOPHEN 325 MG PO TABS: 650.0000 mg | ORAL_TABLET | Freq: Four times a day (QID) | ORAL | Status: DC | PRN

## 2022-11-26 MED ORDER — BISACODYL 5 MG PO TBEC
5.0000 mg | DELAYED_RELEASE_TABLET | Freq: Every day | ORAL | Status: DC | PRN
  Administered 2022-11-26 – 2022-11-27 (×2): 5 mg via ORAL
  Filled 2022-11-26 (×2): qty 1

## 2022-11-26 NOTE — Care Management Important Message (Signed)
Important Message  Patient Details  Name: [name] MRN: [id number] Date of Birth: 02/27/1936   Medicare Important Message Given:  Yes     Derward Marple Montine Circle 11/26/2022, 3:17 PM

## 2022-11-26 NOTE — TOC Transition Note (Addendum)
Transition of Care [facility]) - CM/SW Discharge Note   Patient Details  Name: [name] MRN: [id number] Date of Birth: 02/07/1936  [facility] CM/SW Contact:  Zenon Mayo, RN Phone Number: 11/26/2022, 1:51 PM   Clinical Narrative:    NCM spoke with patient, offered choice, she chose [facility],  NCM made referral to [facility] with [facility], he is able to take referral , soc will begin 24 to 48 hrs post dc.  Patient states her aide will be here to transport her at 9 am in the morning and she needs to be ready to go at that time. MD aware. NCM notified by staff RN that patient wants to change her choice from [facility] to [facility].  NCM notifed Corene Cornea with [facility] and made referral to [place] with [facility] . [facility] is able to take referral . Soc will begin 24 to 48hrs post dc.    Final next level of care: Home w Home Health Services Barriers to Discharge: No Barriers Identified   Patient Goals and CMS Choice CMS Medicare.gov Compare Post Acute Care list provided to:: Patient Choice offered to / list presented to : Patient  Discharge Placement                         Discharge Plan and Services Additional resources added to the After Visit Summary for                  DME Arranged: N/A         HH Arranged: PT, OT [place] Date HH Agency Contacted: 11/26/22 Time [place]: [id number] Representative spoke with at [place] ([place]) Interventions [place]: No Food Insecurity (11/22/2022)  Housing: Low Risk  (11/22/2022)  Transportation Needs: No Transportation Needs (11/22/2022)  Utilities: Not At Risk (11/22/2022)  Tobacco Use: Low Risk  (11/22/2022)     Readmission Risk Interventions     No data to display

## 2022-11-26 NOTE — Progress Notes (Addendum)
Approximately 12:50: Pt's HR jumped to A-fib between 120-140s and sustained in the 130s. Pt stated she felt like she "can tell" HR feels different and feels like she has to burp. ECG obtained. MD Arrien notified. Per MD, order placed for x1 dose of Metoprolol 2.'5mg'$  IV. RN to administer and continue to monitor pt.    Approximately 16:50: HR increased again to 130s. Came down shortly after metoprolol IV earlier, but now looks to be sustaining between 120-130s. MD Arrien notified. Per MD, order placed for x1 dose of Metoprolol '5mg'$  IV. RN to administer and continue to monitor pt.

## 2022-11-26 NOTE — Progress Notes (Signed)
Initial Nutrition Assessment  DOCUMENTATION CODES:   Not applicable  INTERVENTION:  Encourage adequate PO intake Assistance with setting up meal trays Ensure Enlive po BID, each supplement provides 350 kcal and 20 grams of protein. MVI with minerals daily  NUTRITION DIAGNOSIS:   Increased nutrient needs related to acute illness as evidenced by estimated needs.  GOAL:   Patient will meet greater than or equal to 90% of their needs  MONITOR:   PO intake, Supplement acceptance, Labs, Weight trends  REASON FOR ASSESSMENT:   Consult Assessment of nutrition requirement/status  ASSESSMENT:   Pt presents with c/o abdominal pain and worsening lower extremity swelling, admitted with hiatal hernia. PMH significant for PAF, chronic HFpEF, HTN, HLD, thoracic aneurysm, severe pulmonary HTN, severe TR, chronic back pain, hypothyroidism.  Review of H&P reflects, pt with decrease of oral intake but she drinks about 1.8-2.0 L of fluid every day.  Pt sitting in chair at time of visit. She expresses lots of frustrations over having to be admitted, abdominal pain while sitting up and wanting to get back into bed. She states that her PO intake is variable and "situational." Unable to obtain additional nutrition related history at this time. MD entered during assessment and discussed plans for discharge with pt.   Meal completions: 1/13: 20% breakfast, 10% lunch 1/14: 50% lunch, 60% dinner 1/15: 0% breakfast, 25% lunch  Reviewed weight history. Pt's weight overall stable within the last year. Noted minimal fluctuations between 45-48 kg. No significant weight loss noted.   Pt with observed muscle losses. Suspect pt with a degree of malnutrition however unable to confirm at this time.   Medications: colace, protonix, carafate  Labs: sodium 132  UOP: 1637m x24 hours I/O's: -[id number] admit  NUTRITION - FOCUSED PHYSICAL EXAM: Deferred to follow up d/t pt frustrations and speaking with  MD.   Diet Order:   Diet Order             Diet - low sodium heart healthy           Diet regular Room service appropriate? Yes; Fluid consistency: Thin  Diet effective now                   EDUCATION NEEDS:   No education needs have been identified at this time  Skin:  Skin Assessment: Reviewed RN Assessment  Last BM:  1/14 (type 5)  Height:   Ht Readings from Last 1 Encounters:  11/22/22 '4\' 11"'$  (1.499 m)    Weight:   Wt Readings from Last 1 Encounters:  11/26/22 45.7 kg   BMI:  Body mass index is 20.35 kg/m.  Estimated Nutritional Needs:   Kcal:  1100-1300  Protein:  55-70g  Fluid:  1.1-1.3L  AlClayborne DanaRDN, LDN Clinical Nutrition

## 2022-11-26 NOTE — Progress Notes (Signed)
Occupational Therapy Treatment Patient Details Name: Zayonna Ayuso MRN: [id number] DOB: 09/16/1936 Today's Date: 11/26/2022   History of present illness Pt is an 87 y.o. female admitted 11/22/22 with abrominal pain; workup for hiatal hernia. Pt developed afib with RVR 1/12. Of note, recent ED visit 5 days prior with UTI dx. PMH includes CHF, afib, HTN, TAA, pulmonary HTN, chronic back pain.   OT comments  Pt with slow progression towards goals this session needing max encouragement to participate in session, very anxious/perseverative about purewick leaking. Pt needing mod I for bed mobility using log roll technique and min guard A for simulated toilet transfer with 1 person HHA, pt declining use of RW. Pt educated on importance of mobility and OOB. Pt presenting with impairments listed below, will follow acutely. Continue to recommend HHOT at d/c.   Recommendations for follow up therapy are one component of a multi-disciplinary discharge planning process, led by the attending physician.  Recommendations may be updated based on patient status, additional functional criteria and insurance authorization.    Follow Up Recommendations  Home health OT     Assistance Recommended at Discharge Intermittent Supervision/Assistance  Patient can return home with the following  A little help with walking and/or transfers;Assistance with cooking/housework;Direct supervision/assist for medications management;Direct supervision/assist for financial management;Assist for transportation;Help with stairs or ramp for entrance;A lot of help with bathing/dressing/bathroom   Equipment Recommendations  None recommended by OT (pt has all needed DME)    Recommendations for Other Services PT consult    Precautions / Restrictions Precautions Precautions: Fall;Other (comment) Precaution Comments: urinary frequency; initiated educ on abdominal prec for comfort Restrictions Weight Bearing Restrictions: No        Mobility Bed Mobility Overal bed mobility: Modified Independent             General bed mobility comments: use of log rolling technique to get OOB    Transfers Overall transfer level: Needs assistance Equipment used: 1 person hand held assist Transfers: Sit to/from Stand, Bed to chair/wheelchair/BSC Sit to Stand: Min guard     Step pivot transfers: Min guard     General transfer comment: pt declining use of RW     Balance Overall balance assessment: Needs assistance Sitting-balance support: Feet supported, No upper extremity supported Sitting balance-Leahy Scale: Fair     Standing balance support: During functional activity, Reliant on assistive device for balance Standing balance-Leahy Scale: Poor Standing balance comment: reliant on UE support                           ADL either performed or assessed with clinical judgement   ADL Overall ADL's : Needs assistance/impaired Eating/Feeding: Set up;Sitting Eating/Feeding Details (indicate cue type and reason): drinking water                     Toilet Transfer: Min Patent examiner Details (indicate cue type and reason): simulated to chair         Functional mobility during ADLs: Min guard      Extremity/Trunk Assessment Upper Extremity Assessment Upper Extremity Assessment: Generalized weakness   Lower Extremity Assessment Lower Extremity Assessment: Defer to PT evaluation        Vision   Additional Comments: appears baseline   Perception Perception Perception: Not tested   Praxis Praxis Praxis: Not tested    Cognition Arousal/Alertness: Awake/alert Behavior During Therapy: Agitated, Restless Overall Cognitive Status: Within Functional Limits for tasks  assessed                                 General Comments: WFL for simple tasks, not formally assessed. multiple complaints        Exercises      Shoulder Instructions        General Comments VSS on RA, requires increased encouragement    Pertinent Vitals/ Pain       Pain Assessment Pain Assessment: Faces Pain Score: 8  Faces Pain Scale: Hurts whole lot Pain Location: back, abdomen, generalized Pain Descriptors / Indicators: Discomfort, Guarding, Grimacing Pain Intervention(s): Limited activity within patient's tolerance, Monitored during session, Repositioned  Home Living                                          Prior Functioning/Environment              Frequency  Min 2X/week        Progress Toward Goals  OT Goals(current goals can now be found in the care plan section)  Progress towards OT goals: Progressing toward goals  Acute Rehab OT Goals Patient Stated Goal: none stated OT Goal Formulation: With patient Time For Goal Achievement: 12/08/22 Potential to Achieve Goals: Good ADL Goals Pt Will Perform Upper Body Dressing: with supervision;sitting Pt Will Perform Lower Body Dressing: with supervision;sitting/lateral leans;sit to/from stand Pt Will Transfer to Toilet: with supervision;ambulating;regular height toilet  Plan Discharge plan remains appropriate;Frequency remains appropriate    Co-evaluation                 AM-PAC OT "6 Clicks" Daily Activity     Outcome Measure   Help from another person eating meals?: None Help from another person taking care of personal grooming?: A Little Help from another person toileting, which includes using toliet, bedpan, or urinal?: A Little Help from another person bathing (including washing, rinsing, drying)?: A Lot Help from another person to put on and taking off regular upper body clothing?: A Little Help from another person to put on and taking off regular lower body clothing?: A Lot 6 Click Score: 17    End of Session    OT Visit Diagnosis: Unsteadiness on feet (R26.81);Other abnormalities of gait and mobility (R26.89);Muscle weakness (generalized)  (M62.81)   Activity Tolerance Patient tolerated treatment well   Patient Left in chair;with call bell/phone within reach;with chair alarm set   Nurse Communication Mobility status        Time: 3785-8850 OT Time Calculation (min): 45 min  Charges: OT General Charges $OT Visit: 1 Visit OT Treatments $Therapeutic Activity: 38-52 mins  Renaye Rakers, OTD, OTR/L SecureChat Preferred Acute Rehab [phone] - [place] 11/26/2022, 11:02 AM

## 2022-11-26 NOTE — Progress Notes (Signed)
   Heart Failure Stewardship Pharmacist Progress Note   PCP: Corliss Blacker, MD PCP-Cardiologist: Freada Bergeron, MD    HPI:  87 yo F with PMH of CHF, DVT, afib, HTN, HLT, thoracic aneurysm, pulmonary hypertension, severe TR, hypothyroidism, and chronic back pain.  She presented to the ED on 1/11 with abdominal pain and LE edema. Recently finished antibiotic therapy for UTI that was diagnosed on 1/6. CXR with enlarged cardiopericardial silhouette with a hiatal hernia. Last ECHO done 06/05/22 showed LVEF 60-65%, no regional wall motion abnormalities, severe asymmetric LVH, RV normal, severe MR, and severe TR. DVT scan negative.   Discharge HF Medications: Diuretic: furosemide 20 mg every MWF ACE/ARB/ARNI: losartan 25 mg daily  Prior to admission HF Medications: Diuretic: furosemide 20 mg every MWF ACE/ARB/ARNI: losartan 25 mg daily  Pertinent Lab Values: Serum creatinine 0.77, BUN 20, Potassium 4.1, Sodium 132  Vital Signs: Weight: 100 lbs (admission weight: 104 lbs) Blood pressure: 110/80s  Heart rate: 70>>120s  I/O: -0.9L yesterday; net -4.4L  Medication Assistance / Insurance Benefits Check: Does the patient have prescription insurance?  Yes Type of insurance plan: [place] state health plan  Outpatient Pharmacy:  Prior to admission outpatient pharmacy: [facility] Is the patient willing to use [place] pharmacy at discharge? Yes Is the patient willing to transition their outpatient pharmacy to utilize a [facility] outpatient pharmacy?   Pending    Assessment: 1. Acute on chronic diastolic CHF (LVEF 50-35%). NYHA class II symptoms. - Continue furosemide 20 mg MWF. Strict I/Os and daily weights. Keep K>4 and Mg>2 - Continue losartan 25 mg daily at discharge - BP labile - consider adding spironolactone once stabilizes - Consider starting Farxiga 10 mg daily at follow up  Plan: 1) Medication changes recommended at this time: - Discharge today  2) Patient  assistance: - Can use monthly copay cards for meds - copay card for [facility] lowers to $0 per month  3)  Education  - Patient has been educated on current HF medications and potential additions to HF medication regimen - Patient verbalizes understanding that over the next few months, these medication doses may change and more medications may be added to optimize HF regimen - Patient has been educated on basic disease state pathophysiology and goals of therapy   Kerby Nora, PharmD, BCPS Heart Failure Stewardship Pharmacist Phone [phone]

## 2022-11-26 NOTE — Discharge Summary (Signed)
Physician Discharge Summary   Patient: [name] MRN: [id number] DOB: 23-Nov-1935  Admit date:     11/22/2022  Discharge date: 11/26/22  Discharge Physician: Jimmy Picket Cully Luckow   PCP: Corliss Blacker, MD   Recommendations at discharge:    Continue pantoprazole 40 mg po bid for 15 days, then continue with once daily.  Added sucralfate tid with meals.  Continue diltiazem for rate control atrial fibrillation Follow up with Dr Gemma Payor in 7 to 10 days.   Discharge Diagnoses: Principal Problem:   Hiatal hernia Active Problems:   PAF (paroxysmal atrial fibrillation) (HCC)   Chronic diastolic CHF (congestive heart failure) (HCC)   Essential hypertension   History of DVT of lower extremity   Hypokalemia  Resolved Problems:   * No resolved hospital problems. [facility] Course: [name] was admitted to the hospital with the working diagnosis of symptomatic hiatal hernia, complicated with atrial fibrillation with RVR.   87 yo female with the past medical history of paroxysmal atrial fibrillation, hypertension, heart failure, thoracic aortic aneurysm, pulmonary hypertension, hiatal hernia and chronic back pain who presented with abdominal pain. Patient had 2 weeks of abdominal pain, upper region, epigastric, associated with nausea but not vomiting. Positive constipation. ED visit 5 days ago, diagnosed with UTI and constipation and discharged home. Unfortunately she continue not feeling well after completing antibiotic, prompting her to come back to the ED.  On her physical examination blood pressure 140/83, HR76, RR 14, 02 saturation 99%, lungs with no wheezing or rales, heart with S1 and S2 present and rhythmic, abdomen with tenderness to deep palpation, no guarding or rebound, no distention, no lower extremity edema.   Na 132, K 4,0 CL 99, bicarbonate at 23, glucose 130 bun 28 cr 0,75  BNP 303  Wbc 8,1 hgb 12.8 plt 186   Urine analysis SG 1,014, protein negative,  21-50 wbc.   Chest radiograph with hyperinflation, no infiltrates or effusion, mild cardiomegaly, significant scoliosis and lateral film with significant kyphosis.   EKG 77 bpm, left axis deviation, normal intervals, sinus rhythm with no significant ST segment or T wave changes. Noisy baseline.   Patient was placed on IV furosemide for volume overload. Analgesics as needed.   01/12 developed atrial fibrillation with RVR and required diltiazem drip.   01/13 converted to sinus rhythm, her abdominal pain has improved with antiacids and anti reflux measures. Continue to be very weak and deconditioned.  01/14 patient clinically improved. 01/15 plan to discharge today but she only has home assistance in the morning, will plan to discharge tomorrow morning.   Assessment and Plan: * Hiatal hernia Patient has a large hiatal hernia with significant reflux, triggering abdominal pain and nausea.    Patient was placed on pantoprazole and sucralfate, along with anti reflux measures (small bites and seating when eating), with improving of her symptoms.  She is tolerating po well and has no nausea or vomiting.   Patient had positive bowel movements during her hospitalization, at home will resume her bowel regimen and dulcolax has been added.   Patient with severe kyphosis, patient prone to reflux.  Continue with home health services.   PAF (paroxysmal atrial fibrillation) ([place]) Patient required IV diltiazem and IV metoprolol to control rapid ventricular response atrial fibrillation.  Patient now converted to sinus rhythm, back on her regular dose of diltiazem. Plan to continue anticoagulation with apixaban.   Chronic diastolic CHF (congestive heart failure) ([place]) 05/2022 echocardiogram with preserved LV systolic function  with EF 60 to 65%, severe asymmetric ventricular hypertrophy of the basal septal segment. RV systolic function is preserved. Severe dilatation of LA and RA.  Severe mitral valve  regurgitation. Severe TR regurgitation.   Patient received IV furosemide, negative fluid balance was achieved, - 4,602 since admission, with significant improvement in her symptoms.  At home will continue regular dose of furosemide every other day.   Continue close blood pressure monitoring.   Essential hypertension Patient will continue with losartan for blood pressure control.   History of DVT of lower extremity Continue anticoagulation with apixaban.,   Hypokalemia Hyponatremia,   At the time of her discharge her renal function is stable with serum cr at 0,77, K is 4,1 and serum bicarbonate at 25. Na 132.           Consultants: none  Procedures performed: none   Disposition: Home Diet recommendation:  Cardiac diet DISCHARGE MEDICATION: Allergies as of 11/26/2022       Reactions   Contrast Media [iodinated Contrast Media] Shortness Of Breath   Iohexol Shortness Of Breath   Mucinex [guaifenesin Er] Other (See Comments)   DIZZINESS   Zanaflex [tizanidine Hcl] Anxiety, Other (See Comments)   Dizziness   Penicillins Rash   Has patient had a PCN reaction causing immediate rash, facial/tongue/throat swelling, SOB or lightheadedness with hypotension: No Has patient had a PCN reaction causing severe rash involving mucus membranes or skin necrosis: No Has patient had a PCN reaction that required hospitalization No Has patient had a PCN reaction occurring within the last 10 years: No If all of the above answers are "NO", then may proceed with Cephalosporin use.   Robaxin [methocarbamol] Diarrhea        Medication List     TAKE these medications    acetaminophen 325 MG tablet Commonly known as: TYLENOL Take 2 tablets (650 mg total) by mouth every 6 (six) hours as needed for moderate pain or mild pain.   apixaban 2.5 MG Tabs tablet Commonly known as: Eliquis Take 1 tablet (2.5 mg total) by mouth 2 (two) times daily.   bisacodyl 5 MG EC tablet Commonly known as:  DULCOLAX Take 1 tablet (5 mg total) by mouth daily as needed for moderate constipation.   BONINE PO Take 12.5 mg by mouth daily as needed (dizziness).   calcium citrate-vitamin D 315-200 MG-UNIT tablet Commonly known as: CITRACAL+D Take 1 tablet by mouth 2 (two) times daily. What changed:  how much to take when to take this   cholecalciferol 1000 units tablet Commonly known as: VITAMIN D Take 2,000 Units by mouth every evening. Vitamin D3   diltiazem 180 MG 24 hr capsule Commonly known as: CARDIZEM CD Take 1 capsule daily What changed:  how much to take how to take this when to take this   estradiol 0.1 MG/GM vaginal cream Commonly known as: ESTRACE Place 1 Applicatorful vaginally once a week.   furosemide 20 MG tablet Commonly known as: LASIX Take one tablet by mouth three times a week What changed:  how much to take how to take this when to take this additional instructions   HYDROcodone-acetaminophen 5-325 MG tablet Commonly known as: NORCO/VICODIN Take 1-2 tablets by mouth every 4 (four) hours as needed (mild pain).   iron polysaccharides 150 MG capsule Commonly known as: NIFEREX Take 150 mg by mouth 3 (three) times a week. M, W, F   levothyroxine 125 MCG tablet Commonly known as: SYNTHROID Take 125 mcg by mouth daily before  breakfast.   losartan 25 MG tablet Commonly known as: COZAAR Take 1 tablet (25 mg total) by mouth daily.   multivitamin tablet Take 1 tablet by mouth daily with breakfast.   omeprazole 20 MG tablet Commonly known as: PRILOSEC OTC Take 20 mg by mouth daily before breakfast.   OVER THE COUNTER MEDICATION Take 1,000 mg by mouth daily. D-Mannose 500 mg   pantoprazole 40 MG tablet Commonly known as: PROTONIX Take twice daily for 15 days and then continue once daily.   potassium chloride 10 MEQ tablet Commonly known as: KLOR-CON M Take 1 tablet (10 mEq total) by mouth daily on Mon, Wed, Fri, and Sundays, with lasix  administration. What changed:  how much to take how to take this when to take this additional instructions   pravastatin 20 MG tablet Commonly known as: PRAVACHOL Take 20 mg by mouth daily with supper.   risedronate 150 MG tablet Commonly known as: ACTONEL Take 150 mg by mouth every 30 (thirty) days. with water on empty stomach, nothing by mouth or lie down for next 30 minutes.   solifenacin 10 MG tablet Commonly known as: VESICARE Take 10 mg by mouth daily.   STOOL SOFTENER PO Take 100 mg by mouth at bedtime.   sucralfate 1 GM/10ML suspension Commonly known as: CARAFATE Take 10 mLs (1 g total) by mouth 4 (four) times daily -  with meals and at bedtime.   VITA-C PO Take 1,000 mg by mouth daily.        Discharge Exam: Filed Weights   11/23/22 0303 11/24/22 0322 11/26/22 0104  Weight: 47.2 kg 46.5 kg 45.7 kg   BP (!) 143/97 (BP Location: Left Arm)   Pulse 77   Temp 98.1 F (36.7 C) (Oral)   Resp 15   Ht '4\' 11"'$  (1.499 m)   Wt 45.7 kg   SpO2 100%   BMI 20.35 kg/m   Patient with sable back pain, no dyspnea or chest pain, she is tolerating po well.   Neurology awake and alert ENT with mild pallor Cardiovascular with S1 and S2 present and rhythmic with no gallops, rubs or murmurs Respirator with no rales or wheezing, positive kyphosis Abdomen with no distention No lower extremity edema   Condition at discharge: stable  The results of significant diagnostics from this hospitalization (including imaging, microbiology, ancillary and laboratory) are listed below for reference.   Imaging Studies: VAS [place] LOWER EXTREMITY VENOUS (DVT) (7a-7p)  Result Date: 11/22/2022  Lower Venous DVT Study Patient Name:  ROSE CELENE PIPPINS  Date of Exam:   11/22/2022 Medical Rec #: [id number]           Accession #:    [id number] Date of Birth: Apr 21, 1936           Patient Gender: F Patient Age:   51 years Exam Location:  [facility] Procedure:      VAS [place] LOWER EXTREMITY VENOUS  (DVT) Referring Phys: Nicki Reaper [place] --------------------------------------------------------------------------------  Indications: Bilateral lower extremity venous swelling, left>right, skin changes.  Risk Factors: CHF. Comparison Study: 02-06-2022 Prior left lower extremity venous study was                   negative for DVT. Performing Technologist: Darlin Coco RDMS, RVT  Examination Guidelines: A complete evaluation includes B-mode imaging, spectral Doppler, color Doppler, and power Doppler as needed of all accessible portions of each vessel. Bilateral testing is considered an integral part of a complete examination. Limited examinations  for reoccurring indications may be performed as noted. The reflux portion of the exam is performed with the patient in reverse Trendelenburg.  +-----+---------------+---------+-----------+----------+--------------+ RIGHTCompressibilityPhasicitySpontaneityPropertiesThrombus Aging +-----+---------------+---------+-----------+----------+--------------+ CFV  Full           Yes      Yes                                 +-----+---------------+---------+-----------+----------+--------------+   +---------+---------------+---------+-----------+----------+--------------+ LEFT     CompressibilityPhasicitySpontaneityPropertiesThrombus Aging +---------+---------------+---------+-----------+----------+--------------+ CFV      Full           Yes      Yes                                 +---------+---------------+---------+-----------+----------+--------------+ SFJ      Full                                                        +---------+---------------+---------+-----------+----------+--------------+ FV Prox  Full                                                        +---------+---------------+---------+-----------+----------+--------------+ FV Mid   Full                                                         +---------+---------------+---------+-----------+----------+--------------+ FV DistalFull                                                        +---------+---------------+---------+-----------+----------+--------------+ PFV      Full                                                        +---------+---------------+---------+-----------+----------+--------------+ POP      Full           Yes      Yes                                 +---------+---------------+---------+-----------+----------+--------------+ PTV      Full                                                        +---------+---------------+---------+-----------+----------+--------------+ PERO     Full                                                        +---------+---------------+---------+-----------+----------+--------------+  Summary: RIGHT: - No evidence of common femoral vein obstruction.  LEFT: - There is no evidence of deep vein thrombosis in the lower extremity.  - No cystic structure found in the popliteal fossa.  *See table(s) above for measurements and observations. Electronically signed by Jamelle Haring on 11/22/2022 at 4:53:09 PM.    Final    CT CHEST ABDOMEN PELVIS WO CONTRAST  Result Date: 11/22/2022 CLINICAL DATA:  Ascending thoracic aneurysm. EXAM: CT CHEST, ABDOMEN AND PELVIS WITHOUT CONTRAST TECHNIQUE: Multidetector CT imaging of the chest, abdomen and pelvis was performed following the standard protocol without IV contrast. RADIATION DOSE REDUCTION: This exam was performed according to the departmental dose-optimization program which includes automated exposure control, adjustment of the mA and/or kV according to patient size and/or use of iterative reconstruction technique. COMPARISON:  CT abdomen/pelvis 11/17/2022 and CT chest from 2018 FINDINGS: CT CHEST FINDINGS Cardiovascular: The heart is within normal limits in size for the patient's age. Stable mitral valve annulus and coronary artery  calcifications. Stable tortuosity and calcification of the thoracic aorta. Stable fusiform aneurysmal dilatation of the ascending thoracic aorta with maximum measurement of 4.6 cm. No change since 2018. Mediastinum/Nodes: Stable large hiatal hernia with a good portion of the stomach up in the chest. No mediastinal or hilar mass or adenopathy. Lungs/Pleura: No acute pulmonary findings. No worrisome pulmonary lesions. No pleural effusions or pleural lesions. Musculoskeletal: Severe scoliosis but no acute bony findings. CT ABDOMEN PELVIS FINDINGS Hepatobiliary: No hepatic lesions are identified without contrast. No intrahepatic biliary dilatation. No common bile duct dilatation. Pancreas: Pancreatic atrophy but no mass or evidence of acute inflammation. Spleen: Normal size. No focal lesions. Adrenals/Urinary Tract: Adrenal glands and kidneys are grossly normal. No renal or obstructing ureteral calculi. The bladder is grossly normal. Stomach/Bowel: Stomach, duodenum, small bowel and colon grossly normal. Vascular/Lymphatic: Stable atherosclerotic calcifications involving the aorta and iliac arteries but no aneurysm. No mesenteric or retroperitoneal mass or adenopathy. Reproductive: Vaginal pessary in place. Small uterus. Other: No pelvic mass or adenopathy. No free pelvic fluid collections. No inguinal mass or adenopathy. No abdominal wall hernia or subcutaneous lesions. Musculoskeletal: Stable sclerotic lesion involving the right iliac bone, likely benign bone island. No acute bony findings. Severe scoliosis and numerous lower thoracic and lumbar compression fractures with vertebral augmentation changes. IMPRESSION: 1. Stable 4.6 cm ascending thoracic aortic aneurysm. No change since 2018. Ascending thoracic aortic aneurysm. Recommend semi-annual imaging followup by CTA or MRA and referral to cardiothoracic surgery if not already obtained. This recommendation follows 2010 ACCF/AHA/AATS/ACR/ASA/SCA/SCAI/SIR/STS/SVM  Guidelines for the Diagnosis and Management of Patients With Thoracic Aortic Disease. Circulation. 2010; 121: C588-F027. Aortic aneurysm NOS (ICD10-I71.9) 2. Stable large hiatal hernia. 3. No acute abdominal/pelvic findings, mass lesions or adenopathy. 4. Atherosclerotic changes involving the abdominal aorta but no aneurysm. 5. Severe scoliosis and associated degenerative spine disease. Multiple lower thoracic and lumbar spine compression fractures with vertebral augmentation changes. Aortic Atherosclerosis (ICD10-I70.0). Electronically Signed   By: Marijo Sanes M.D.   On: 11/22/2022 14:50   DG Chest 2 View  Result Date: 11/22/2022 CLINICAL DATA:  Rib pain.  Worse with inspiration EXAM: CHEST - 2 VIEW COMPARISON:  10/10/2022 and older x-ray series FINDINGS: Film is rotated to left the patient is tilted. Enlarged cardiopericardial silhouette. Calcified tortuous aorta. No consolidation, pneumothorax or effusion. Fixation hardware along the lower cervical spine at the edge of the imaging field. There is kyphosis centered over the lower thoracic spine with multilevel augmentation cement along compressed vertebral bodies. Please correlate  with clinical history. Surgical clips overlie the right hemithorax. There is also a double density overlying the heart with air consistent with a hiatal hernia. IMPRESSION: Rotated and tilted radiograph. Enlarged cardiopericardial silhouette with a hiatal hernia. No consolidation, effusion or pneumothorax. The rib fractures identified on the prior study are less well identified on this current exam Electronically Signed   By: Jill Side M.D.   On: 11/22/2022 11:28   CT ABDOMEN PELVIS WO CONTRAST  Result Date: 11/17/2022 CLINICAL DATA:  Left lower quadrant abdominal pain. Complicated diverticulitis suspected. EXAM: CT ABDOMEN AND PELVIS WITHOUT CONTRAST TECHNIQUE: Multidetector CT imaging of the abdomen and pelvis was performed following the standard protocol without IV  contrast. RADIATION DOSE REDUCTION: This exam was performed according to the departmental dose-optimization program which includes automated exposure control, adjustment of the mA and/or kV according to patient size and/or use of iterative reconstruction technique. COMPARISON:  Abdominopelvic CT 05/30/2017. FINDINGS: Lower chest: There is a large hiatal hernia which has a probable paraesophageal component and has enlarged from the prior abdominal CT. There is compressive atelectasis in the adjacent left lower lobe. No confluent airspace opacity, pleural or pericardial effusion. There are calcifications of the aortic valve, mitral annulus, thoracic aorta and coronary arteries. Hepatobiliary: No focal hepatic abnormalities are identified on noncontrast imaging. No evidence of gallstones, gallbladder wall thickening or biliary dilatation. Pancreas: The pancreas appears atrophied without focal abnormality, ductal dilatation or surrounding inflammation. Spleen: Normal in size without focal abnormality. Adrenals/Urinary Tract: No evidence of adrenal mass. No evidence of urinary tract calculus, suspicious renal lesion or hydronephrosis. The bladder appears unremarkable for its degree of distention. Stomach/Bowel: Enteric contrast was administered and has passed through the stomach and small bowel into the distal colon. There is moderate stool throughout the colon, especially within the rectum. No bowel distension, wall thickening or focal surrounding inflammation identified. Vascular/Lymphatic: There are no enlarged abdominal or pelvic lymph nodes. Aortic and branch vessel atherosclerosis without evidence of aneurysm. Reproductive: Vaginal pessary in place. The uterus appears atrophied. No evidence of adnexal mass. Other: No free air, ascites, extravasated enteric contrast or focal extraluminal fluid collection identified. Musculoskeletal: The bones are diffusely demineralized. Unchanged densely sclerotic lesion in the  right ilium, consistent with a bone island. There are multiple thoracolumbar compression deformities associated with a convex right scoliosis. Spinal augmentation has been performed at L4 in the interval with some posterior epidural extension of cement. Previous spinal augmentation at T11 and L1. Grossly unchanged chronic compression fractures at L2 and L3. T9 fracture was not previously imaged, although does not appear acute. IMPRESSION: 1. No definite acute findings or explanation for the patient's symptoms. No definite signs of acute diverticulitis. 2. Moderate stool throughout the colon, especially within the rectum. Correlate for constipation. 3. Interval enlargement of large hiatal hernia which appears to have a paraesophageal component but no associated obstruction. 4. Multiple thoracolumbar compression deformities with interval spinal augmentation at L4. A T9 fracture was not previously imaged, although does not appear acute. 5.  Aortic Atherosclerosis (ICD10-I70.0). Electronically Signed   By: Richardean Sale M.D.   On: 11/17/2022 17:53   DG Abd 2 Views  Result Date: 11/16/2022 CLINICAL DATA:  Left lower quadrant/abdominal pain. EXAM: ABDOMEN - 2 VIEW COMPARISON:  CT scan of the abdomen and pelvis April 30, 2017. FINDINGS: Lung bases are normal. No free air, portal venous gas, or pneumatosis. A large stool ball is identified in the rectum measuring up to 12 cm in greatest diameter. There is  an air-filled mildly prominent loop of bowel in the upper pelvis, likely in sigmoid colon which is not dilated by criteria. No evidence of bowel obstruction. A sclerotic lesion is identified in the right iliac bone, just lateral to the SI joint. The patient is status post vertebroplasty at multiple levels. No other abnormalities. IMPRESSION: 1. A large stool ball is identified in the rectum measuring up to 12 cm in greatest diameter. No evidence of bowel obstruction. 2. A sclerotic lesion is identified in the right  iliac bone, just lateral to the SI joint. The sclerotic lesion is unchanged since x-rays dated February 24, 2020 and Apr 07, 2021. Electronically Signed   By: Dorise Bullion III M.D.   On: 11/16/2022 09:17    Microbiology: Results for orders placed or performed during the hospital encounter of 02/01/20  Aerobic Culture  (superficial specimen)     Status: None   Collection Time: 02/01/20  3:20 PM   Specimen: Leg  Result Value Ref Range Status   Specimen Description   Final    LEG RIGHT POSTERIOR LOWER Performed at [facility] [address], [place], [place] [address]    Special Requests   Final    NONE Performed at [facility], [place] [address], [place], [place] [address]    Gram Stain   Final    RARE WBC PRESENT, PREDOMINANTLY PMN NO ORGANISMS SEEN    Culture   Final    RARE NORMAL SKIN FLORA Performed at [facility], [place] [address], [place], [place] [address]    Report Status 02/05/2020 FINAL  Final    Labs: CBC: Recent Labs  Lab 11/22/22 1107  WBC 8.1  NEUTROABS 6.2  HGB 12.8  HCT 36.4  MCV 85.6  PLT 970   Basic Metabolic Panel: Recent Labs  Lab 11/22/22 1107 11/23/22 0053 11/24/22 0036 11/25/22 0109  NA 132* 135 132* 132*  K 4.0 3.4* 4.8 4.1  CL 99 98 98 99  CO2 '23 26 27 25  '$ GLUCOSE 130* 100* 108* 102*  BUN 28* 24* 26* 20  CREATININE 0.75 0.69 0.77 0.77  CALCIUM 9.2 8.8* 8.7* 8.6*   Liver Function Tests: Recent Labs  Lab 11/22/22 1107  AST 30  ALT 25  ALKPHOS 54  BILITOT 0.6  PROT 6.5  ALBUMIN 3.7   CBG: No results for input(s): "GLUCAP" in the last 168 hours.  Discharge time spent: greater than 30 minutes.  Signed: Tawni Millers, MD [facility] 11/26/2022

## 2022-11-27 DIAGNOSIS — I5032 Chronic diastolic (congestive) heart failure: Secondary | ICD-10-CM | POA: Diagnosis not present

## 2022-11-27 DIAGNOSIS — I10 Essential (primary) hypertension: Secondary | ICD-10-CM | POA: Diagnosis not present

## 2022-11-27 DIAGNOSIS — K449 Diaphragmatic hernia without obstruction or gangrene: Secondary | ICD-10-CM | POA: Diagnosis not present

## 2022-11-27 DIAGNOSIS — I480 Paroxysmal atrial fibrillation: Secondary | ICD-10-CM | POA: Diagnosis not present

## 2022-11-27 MED ORDER — METOPROLOL TARTRATE 25 MG PO TABS
25.0000 mg | ORAL_TABLET | Freq: Two times a day (BID) | ORAL | Status: DC
  Administered 2022-11-27 – 2022-12-03 (×12): 25 mg via ORAL
  Filled 2022-11-27 (×13): qty 1

## 2022-11-27 MED ORDER — FUROSEMIDE 20 MG PO TABS
20.0000 mg | ORAL_TABLET | Freq: Every day | ORAL | Status: DC
  Administered 2022-11-27: 20 mg via ORAL
  Filled 2022-11-27: qty 1

## 2022-11-27 MED ORDER — SODIUM CHLORIDE 0.9 % IV BOLUS
250.0000 mL | Freq: Once | INTRAVENOUS | Status: AC
  Administered 2022-11-27: 250 mL via INTRAVENOUS

## 2022-11-27 NOTE — Progress Notes (Signed)
Physical Therapy Treatment Patient Details Name: [name] MRN: [id number] DOB: 12-01-1935 Today's Date: 11/27/2022   History of Present Illness Pt is an 87 y.o. female admitted 11/22/22 with abrominal pain; workup for hiatal hernia. Pt developed afib with RVR 1/12. Of note, recent ED visit 5 days prior with UTI dx. PMH includes CHF, afib, HTN, TAA, pulmonary HTN, chronic back pain.    PT Comments    Patient not progressing with mobility today, reports generalized pain and now back in A-fib. Requires more assist with bed mobility today; mod A to elevate trunk with increased time. Tolerated multiple step pivot transfers to [facility] and chair with Min A for support. HR ranging from 120s-150s, A-fib with activity. Encouraged up in chair for all meals and increasing activity/walking to prepare for d/c home. Will continue to follow and progress as tolerated.   Recommendations for follow up therapy are one component of a multi-disciplinary discharge planning process, led by the attending physician.  Recommendations may be updated based on patient status, additional functional criteria and insurance authorization.  Follow Up Recommendations  Home health PT     Assistance Recommended at Discharge Intermittent Supervision/Assistance  Patient can return home with the following A little help with walking and/or transfers;A little help with bathing/dressing/bathroom;Assistance with cooking/housework;Assist for transportation;Help with stairs or ramp for entrance   Equipment Recommendations  None recommended by PT    Recommendations for Other Services       Precautions / Restrictions Precautions Precautions: Fall;Other (comment) Precaution Comments: urinary frequency; initiated educ on abdominal prec for comfort Restrictions Weight Bearing Restrictions: No     Mobility  Bed Mobility Overal bed mobility: Needs Assistance Bed Mobility: Rolling, Sidelying to Sit Rolling:  Supervision Sidelying to sit: Mod assist       General bed mobility comments: ASsist with trunk to get to EOB with increased time and heavy use of rail.    Transfers Overall transfer level: Needs assistance Equipment used: 1 person hand held assist Transfers: Sit to/from Stand, Bed to chair/wheelchair/BSC Sit to Stand: Min assist   Step pivot transfers: Min guard, Min assist       General transfer comment: Stood from EOB x1, step pivot transfer to [facility] with Min assist and step pivot transfer to chair with increased time. Needs UE support at all time. HR in A-fib, up to 150s bpm max.    Ambulation/Gait               General Gait Details: Declined   Stairs             Wheelchair Mobility    Modified Rankin (Stroke Patients Only)       Balance Overall balance assessment: Needs assistance Sitting-balance support: Feet supported, Single extremity supported Sitting balance-Leahy Scale: Poor Sitting balance - Comments: Needs close min gaurd-supervision for safety, needing UE support sitting EOB today   Standing balance support: During functional activity, Bilateral upper extremity supported Standing balance-Leahy Scale: Poor Standing balance comment: Reliant on external support                            Cognition Arousal/Alertness: Awake/alert Behavior During Therapy:  (irritated) Overall Cognitive Status: Within Functional Limits for tasks assessed                                 General Comments: WFL for simple tasks, not  formally assessed. multiple complaints        Exercises      General Comments General comments (skin integrity, edema, etc.): Pt in A-fib with HR ranging from 120s-150s bpm with activity.      Pertinent Vitals/Pain Pain Assessment Pain Assessment: Faces Faces Pain Scale: Hurts whole lot Pain Location: back, abdomen, generalized Pain Descriptors / Indicators: Discomfort, Guarding, Grimacing Pain  Intervention(s): Monitored during session, Repositioned, Limited activity within patient's tolerance    Home Living                          Prior Function            PT Goals (current goals can now be found in the care plan section) Progress towards PT goals: Not progressing toward goals - comment (due to lots of complaints, pain, fatigue, HR)    Frequency    Min 3X/week      PT Plan Current plan remains appropriate    Co-evaluation              AM-PAC PT "6 Clicks" Mobility   Outcome Measure  Help needed turning from your back to your side while in a flat bed without using bedrails?: A Little Help needed moving from lying on your back to sitting on the side of a flat bed without using bedrails?: A Lot Help needed moving to and from a bed to a chair (including a wheelchair)?: A Little Help needed standing up from a chair using your arms (e.g., wheelchair or bedside chair)?: A Little Help needed to walk in hospital room?: Total Help needed climbing 3-5 steps with a railing? : Total 6 Click Score: 13    End of Session Equipment Utilized During Treatment: Gait belt Activity Tolerance: Patient limited by pain;Patient limited by fatigue;Other (comment) (HR) Patient left: in chair;with call bell/phone within reach;Other (comment) (tech going in to [employer]) Nurse Communication: Mobility status PT Visit Diagnosis: Other abnormalities of gait and mobility (R26.89);Muscle weakness (generalized) (M62.81)     Time: 1610-9604 PT Time Calculation (min) (ACUTE ONLY): 38 min  Charges:  $Therapeutic Activity: 23-37 mins                     Marisa Severin, PT, DPT Acute Rehabilitation Services Secure chat preferred Office [place] 11/27/2022, 12:31 PM

## 2022-11-27 NOTE — Progress Notes (Signed)
Progress Note   Patient: [name] ION:[id number] DOB: August 28, 1936 DOA: 11/22/2022     5 DOS: the patient was seen and examined on 11/27/2022   Brief hospital course: [name] was admitted to the hospital with the working diagnosis of symptomatic hiatal hernia, complicated with atrial fibrillation with RVR.   87 yo female with the past medical history of paroxysmal atrial fibrillation, hypertension, heart failure, thoracic aortic aneurysm, pulmonary hypertension, hiatal hernia and chronic back pain who presented with abdominal pain. Patient had 2 weeks of abdominal pain, upper region, epigastric, associated with nausea but not vomiting. Positive constipation. ED visit 5 days ago, diagnosed with UTI and constipation and discharged home. Unfortunately she continue not feeling well after completing antibiotic, prompting her to come back to the ED.  On her physical examination blood pressure 140/83, HR76, RR 14, 02 saturation 99%, lungs with no wheezing or rales, heart with S1 and S2 present and rhythmic, abdomen with tenderness to deep palpation, no guarding or rebound, no distention, no lower extremity edema.   Na 132, K 4,0 CL 99, bicarbonate at 23, glucose 130 bun 28 cr 0,75  BNP 303  Wbc 8,1 hgb 12.8 plt 186   Urine analysis SG 1,014, protein negative, 21-50 wbc.   Chest radiograph with hyperinflation, no infiltrates or effusion, mild cardiomegaly, significant scoliosis and lateral film with significant kyphosis.   EKG 77 bpm, left axis deviation, normal intervals, sinus rhythm with no significant ST segment or T wave changes. Noisy baseline.   Patient was placed on IV furosemide for volume overload. Analgesics as needed.   01/12 developed atrial fibrillation with RVR and required diltiazem drip.   01/13 converted to sinus rhythm, her abdominal pain has improved with antiacids and anti reflux measures. Continue to be very weak and deconditioned.  01/14 patient clinically  improved. 01/15 plan to discharge today but she only has home assistance in the morning, will plan to discharge tomorrow morning.  01/16 her discharge has been cancelled due to atrial fibrillation with RVR.  Possible discharge on 01/17   Assessment and Plan: * Hiatal hernia Patient has a large hiatal hernia with significant reflux, triggering abdominal pain and nausea.    Patient was placed on pantoprazole and sucralfate, along with anti reflux measures (small bites and seating when eating), with improving of her symptoms.  She is tolerating po well and has no nausea or vomiting.   Patient had positive bowel movements during her hospitalization, at home will resume her bowel regimen and dulcolax has been added.   Patient with severe kyphosis, patient prone to reflux.  Continue with home health services.   PAF (paroxysmal atrial fibrillation) ([place]) Patient required IV diltiazem and IV metoprolol to control rapid ventricular response atrial fibrillation.  Patient now converted to sinus rhythm, back on her regular dose of diltiazem, but then been back on atrial fibrillation, since last evening.  Plan to continue diltiazem and will add metoprolol,and continue telemetry monitoring for the next 24 hrs.  Plan to continue anticoagulation with apixaban.   Chronic diastolic CHF (congestive heart failure) ([place]) 05/2022 echocardiogram with preserved LV systolic function with EF 60 to 65%, severe asymmetric ventricular hypertrophy of the basal septal segment. RV systolic function is preserved. Severe dilatation of LA and RA.  Severe mitral valve regurgitation. Severe TR regurgitation.   Patient received IV furosemide, negative fluid balance was achieved, - 4,602 since admission, with significant improvement in her symptoms.  At home will continue regular dose of furosemide  every other day.   Continue close blood pressure monitoring.   Essential hypertension Patient will continue with losartan  for blood pressure control.   History of DVT of lower extremity Continue anticoagulation with apixaban.,   Hypokalemia Hyponatremia,   At the time of her discharge her renal function is stable with serum cr at 0,77, K is 4,1 and serum bicarbonate at 25. Na 132.          Subjective: Patient had A fib last evening, her discharge has been cancelled. This am with no dyspnea, positive palpitations, back pain has been stable and she has been tolerating po well.   Physical Exam: Vitals:   11/26/22 2008 11/26/22 2015 11/27/22 0443 11/27/22 0445  BP: 100/75  94/67 94/67  Pulse: 71  (!) 50   Resp: (!) '21 18 16   '$ Temp: 97.9 F (36.6 C)  97.6 F (36.4 C)   TempSrc: Oral  Oral   SpO2: 98%  96%   Weight:   50.2 kg   Height:       Neurology awake and alert ENT with mild pallor Cardiovascular with S1 and S2 present, irregularly irregular with no gallops, rubs or murmurs Respiratory with no rales or wheezing, no rhonchi Abdomen with no distention No lower extremity edema Sever kyphosis  Data Reviewed:    Family Communication: no family at the bedside   Disposition: Status is: Inpatient Remains inpatient appropriate because: atrial fibrillation with RVR   Planned Discharge Destination: Home possible tomorrow       Author: Tawni Millers, MD 11/27/2022 9:37 AM  For on call review [email or web address].

## 2022-11-27 NOTE — Progress Notes (Signed)
Patient complaining of dysuria. She is not septic. Plan to check UA and culture.

## 2022-11-27 NOTE — Progress Notes (Addendum)
PROGRESS NOTE    [name]  UGQ:[id number] DOB: [birth year] DOA: 11/22/2022 PCP: Corliss Blacker, MD  86/F w/l history of P Afib, hypertension, chronic diastolic CHF, thoracic aortic aneurysm, pulmonary hypertension, hiatal hernia and chronic back pain who presented with abdominal pain. Patient had 2 weeks of abdominal pain, upper region, epigastric, associated with nausea but not vomiting, w/ constipation. ED visit 5 days ago, diagnosed with UTI and constipation and discharged home. -Returned to ED w/ persistent symptoms, cr 0,75 ,BNP 303, Wbc 8,1 hgb 12.8 , UA 21-50 wbc. CXR w/ no infiltrates or effusion, mild cardiomegaly, significant scoliosis and lateral film with significant kyphosis.  -Patient was placed on IV furosemide for volume overload -1/12 developed atrial fibrillation with RVR and required diltiazem drip.  -1/13 converted to sinus rhythm, her abdominal pain improving with PPI and anti reflux measures.  remained weak and deconditioned.  -1/14 patient clinically improved. -1/16 her discharge has been cancelled due to atrial fibrillation with RVR. Metop added -1/17: Feels tired, A-fib RVR persists, cardiology consulted  Subjective: -Heart rate elevated this morning, 1 20-1 40 range, patient feels tired, denies any dyspnea  Assessment and Plan:  Hiatal hernia Abd pain -large hiatal hernia on imaging w/ with significant reflux, triggering abdominal pain and nausea.  Along with constipation -2 CT scans last week 1/6 and 1/11, notable for large hiatal hernia, constipation, significant scoliosis and degenerative spine disease -placed on pantoprazole and sucralfate, along with anti reflux measures (small bites and seating when eating), some improvement noted -Add laxatives, although she is considerably frail and debilitated she declines rehab, plan for DC home w/ [facility] services when stable  PAF (paroxysmal atrial fibrillation) ([facility]) -required IV diltiazem and IV metoprolol  to control rapid ventricular response atrial fibrillation, then converted to sinus rhythm, back on her regular dose of diltiazem,  -1/16 back w/ AFib RVR- continue diltiazem, Dr. Cathlean Sauer added metoprolol -continue apixaban -A-fib RVR again this morning, will request cardiology input has considerable valvular heart disease, patient is not too keen regarding cardioversion and with considerable ongoing GI symptoms may not be a great candidate for amiodarone  Chronic diastolic CHF (congestive heart failure) ([place]) 05/2022 echo with EF 60 to 65%, severe asymmetric ventricular hypertrophy of the basal septal segment. RV normal. Severe dilatation of LA and RA.  Severe mitral valve regurgitation. Severe TR regurgitation.  -Diuresed with IV Lasix, she is 5.1 L negative -Now on low-dose oral Lasix, GDMT limited by soft BPs  History of DVT of lower extremity Continue anticoagulation with apixaban.,   Hypokalemia Hyponatremia,  -monitor w/ diuretics  DVT prophylaxis: apixaban Code Status: DNR Family Communication: None present Disposition Plan: Home pending improvement in symptoms, A-fib RVR  Consultants: Cards   Procedures:   Antimicrobials:    Objective: Vitals:   11/26/22 2015 11/27/22 0443 11/27/22 0445 11/27/22 1048  BP:  '94/67 94/67 93/80 '$  Pulse:  (!) 50  86  Resp: '18 16  20  '$ Temp:  97.6 F (36.4 C)  97.6 F (36.4 C)  TempSrc:  Oral  Oral  SpO2:  96%  97%  Weight:  50.2 kg    Height:        Intake/Output Summary (Last 24 hours) at 11/27/2022 1233 Last data filed at 11/27/2022 0900 Gross per 24 hour  Intake 800 ml  Output 750 ml  Net 50 ml   Filed Weights   11/24/22 0322 11/26/22 0104 11/27/22 0443  Weight: 46.5 kg 45.7 kg 50.2 kg  Examination: Frail chronically ill cachectic female with significant kyphoscoliosis, AAOx3 CVS: S1-S2, irregular rhythm, tachycardic Lungs: Decreased breath sounds to bases Abdomen: Soft, nontender, bowel sounds present Extremities: No  edema    Data Reviewed:   CBC: Recent Labs  Lab 11/22/22 1107  WBC 8.1  NEUTROABS 6.2  HGB 12.8  HCT 36.4  MCV 85.6  PLT 644   Basic Metabolic Panel: Recent Labs  Lab 11/22/22 1107 11/23/22 0053 11/24/22 0036 11/25/22 0109  NA 132* 135 132* 132*  K 4.0 3.4* 4.8 4.1  CL 99 98 98 99  CO2 '23 26 27 25  '$ GLUCOSE 130* 100* 108* 102*  BUN 28* 24* 26* 20  CREATININE 0.75 0.69 0.77 0.77  CALCIUM 9.2 8.8* 8.7* 8.6*   GFR: Estimated Creatinine Clearance: 35.7 mL/min (by C-G formula based on SCr of 0.77 mg/dL). Liver Function Tests: Recent Labs  Lab 11/22/22 1107  AST 30  ALT 25  ALKPHOS 54  BILITOT 0.6  PROT 6.5  ALBUMIN 3.7   Recent Labs  Lab 11/22/22 1107  LIPASE 43   No results for input(s): "AMMONIA" in the last 168 hours. Coagulation Profile: No results for input(s): "INR", "PROTIME" in the last 168 hours. Cardiac Enzymes: No results for input(s): "CKTOTAL", "CKMB", "CKMBINDEX", "TROPONINI" in the last 168 hours. BNP (last 3 results) No results for input(s): "PROBNP" in the last 8760 hours. HbA1C: No results for input(s): "HGBA1C" in the last 72 hours. CBG: No results for input(s): "GLUCAP" in the last 168 hours. Lipid Profile: No results for input(s): "CHOL", "HDL", "LDLCALC", "TRIG", "CHOLHDL", "LDLDIRECT" in the last 72 hours. Thyroid Function Tests: No results for input(s): "TSH", "T4TOTAL", "FREET4", "T3FREE", "THYROIDAB" in the last 72 hours. Anemia Panel: No results for input(s): "VITAMINB12", "FOLATE", "FERRITIN", "TIBC", "IRON", "RETICCTPCT" in the last 72 hours. Urine analysis:    Component Value Date/Time   COLORURINE YELLOW 11/22/2022 [place] 11/22/2022 1247   LABSPEC 1.014 11/22/2022 1247   PHURINE 6.0 11/22/2022 1247   GLUCOSEU NEGATIVE 11/22/2022 1247   HGBUR NEGATIVE 11/22/2022 1247   BILIRUBINUR NEGATIVE 11/22/2022 1247   KETONESUR NEGATIVE 11/22/2022 1247   PROTEINUR NEGATIVE 11/22/2022 1247   UROBILINOGEN  0.2 09/04/2015 1404   NITRITE NEGATIVE 11/22/2022 1247   LEUKOCYTESUR LARGE (A) 11/22/2022 1247   Sepsis Labs: '@LABRCNTIP'$ (procalcitonin:4,lacticidven:4)  )No results found for this or any previous visit (from the past 240 hour(s)).   Radiology Studies: No results found.   Scheduled Meds:  apixaban  2.5 mg Oral BID   capsicum   Topical BID   diltiazem  180 mg Oral Daily   docusate sodium  100 mg Oral QHS   feeding supplement  237 mL Oral BID BM   furosemide  20 mg Oral Daily   levothyroxine  125 mcg Oral Q0600   metoprolol tartrate  25 mg Oral BID   multivitamin with minerals  1 tablet Oral Daily   pantoprazole  40 mg Oral BID   pravastatin  20 mg Oral Q supper   sucralfate  1 g Oral TID WC & HS   Continuous Infusions:   LOS: 5 days    Time spent: 83mn    PDomenic Polite MD [facility]   11/27/2022, 12:33 PM

## 2022-11-28 ENCOUNTER — Encounter (HOSPITAL_COMMUNITY): Payer: Self-pay | Admitting: Internal Medicine

## 2022-11-28 DIAGNOSIS — I480 Paroxysmal atrial fibrillation: Secondary | ICD-10-CM | POA: Diagnosis not present

## 2022-11-28 DIAGNOSIS — I340 Nonrheumatic mitral (valve) insufficiency: Secondary | ICD-10-CM

## 2022-11-28 DIAGNOSIS — I10 Essential (primary) hypertension: Secondary | ICD-10-CM | POA: Diagnosis not present

## 2022-11-28 DIAGNOSIS — I7121 Aneurysm of the ascending aorta, without rupture: Secondary | ICD-10-CM

## 2022-11-28 DIAGNOSIS — K449 Diaphragmatic hernia without obstruction or gangrene: Secondary | ICD-10-CM | POA: Diagnosis not present

## 2022-11-28 DIAGNOSIS — I5032 Chronic diastolic (congestive) heart failure: Secondary | ICD-10-CM | POA: Diagnosis not present

## 2022-11-28 LAB — BASIC METABOLIC PANEL
Anion gap: 6 (ref 5–15)
BUN: 20 mg/dL (ref 8–23)
CO2: 26 mmol/L (ref 22–32)
Calcium: 8.5 mg/dL — ABNORMAL LOW (ref 8.9–10.3)
Chloride: 102 mmol/L (ref 98–111)
Creatinine, Ser: 0.89 mg/dL (ref 0.44–1.00)
GFR, Estimated: >60 mL/min (ref >60)
Glucose, Bld: 100 mg/dL — ABNORMAL HIGH (ref 70–99)
Potassium: 4.3 mmol/L (ref 3.5–5.1)
Sodium: 134 mmol/L — ABNORMAL LOW (ref 135–145)

## 2022-11-28 LAB — MAGNESIUM: Magnesium: 2.1 mg/dL (ref 1.7–2.4)

## 2022-11-28 MED ORDER — AMIODARONE HCL 200 MG PO TABS: 200.0000 mg | ORAL_TABLET | Freq: Every day | ORAL | Status: DC

## 2022-11-28 MED ORDER — POLYETHYLENE GLYCOL 3350 17 G PO PACK
17.0000 g | PACK | Freq: Every day | ORAL | Status: DC
  Administered 2022-11-28: 17 g via ORAL
  Filled 2022-11-28 (×2): qty 1

## 2022-11-28 MED ORDER — SENNOSIDES-DOCUSATE SODIUM 8.6-50 MG PO TABS
1.0000 | ORAL_TABLET | Freq: Two times a day (BID) | ORAL | Status: DC
  Administered 2022-11-28 – 2022-12-02 (×7): 1 via ORAL
  Filled 2022-11-28 (×10): qty 1

## 2022-11-28 MED ORDER — AMIODARONE HCL 200 MG PO TABS
200.0000 mg | ORAL_TABLET | Freq: Two times a day (BID) | ORAL | Status: DC
  Administered 2022-11-28 – 2022-12-03 (×10): 200 mg via ORAL
  Filled 2022-11-28 (×10): qty 1

## 2022-11-28 NOTE — Progress Notes (Addendum)
Patient complaining of dizziness and weakness but refuses the PRN meclizine due to unfamiliarity with medication. Reposition pt bed to 30 deg. Vital sign as follows, BP 107/82 MAP(92); Pulse rate 97; Respiration 12; SPO2 97.   Pt stated her symptoms is not dizziness but lightheadedness express concern that if she gets up she will pass out or fall. MD made notified order EKG.

## 2022-11-28 NOTE — Consult Note (Signed)
Cardiology Consultation   Patient ID: [name] MRN: [id number]; DOB: Dec 03, 1935  Admit date: 11/22/2022 Date of Consult: 11/28/2022  PCP:  Corliss Blacker, MD   [facility] Cardiologist:  Freada Bergeron, MD   Patient Profile:   [name] is a 87 y.o. female with a hx of PAF on eliquis, chronic diastolic heart failure, mitral valve prolapse, HTN, HLD, thoracic aneurysm, severe pulmonary HTN, severe TR, chronic back pain, severe kyphosis who is being seen 11/28/2022 for the evaluation of atrial fibrillation at the request of Dr. Broadus John.  History of Present Illness:   [name] is an 87 year old female with above medical history who is followed by Dr. Johney Frame. Patient has a known history of thoracic aortic aneurysm, was 4.7 cm on CT in 2018. Patient was evaluated by Dr. Lawson Fiscal and was not a candidate for repair given severe kyphosis. Managed with BP control.   Patient established care with cardiology in 2018 during an admission for new onset atrial fibrillation. Echocardiogram on 11/28/16 showed EF 55-60%, mild-moderate AI, moderate mitral valve regurgitation, severe tricuspid valve regurgitation. Patient spontaneously converted to NSR. Started on Flecainide. TEE on 12/04/16 showed mild AI, mitral valve with partially flail P2 segment with moderate MR. 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 Later, a nuclear stress test on 01/29/17 was a low risk study.   Patient later seen in clinic in 03/2021 where she complained of mild dyspnea on exertion. Echocardiogram on 04/21/21 showed EF 34-74%, grade I diastolic dysfunction, normal RV systolic function, mildly elevated pulmonary artery systolic pressure. 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 Moderate tricuspid valve  regurgitation. Patient was referred to Dr. Burt Knack, at that time patient was not inclined to pursue any further evaluation or treatment of her mitral valve regurgitation. Noted that patient may be willing to consider in the future if her symptoms worsened. Due to patient's advanced kyphoscoliosis, she did not undergo TEE.   Flecainide was stopped in 02/2022 due to age and valvular disease. Patient has been on eliquis and diltiazem.   Most recent echocardiogram from 05/2022 showed EF 60-65%, no regional wall motion abnormalities, no regional wall motion, severe asymmetric LVH. Noted that there is systolic anterior motion of the mitral valve, but most of the MR is related to atrial dilation and bi-leaflet prolapse. Severe mitral valve regurgitation. Severely elevated pulmonary artery systolic pressure. Severely dilated LA and RA. Patient was last seen by cardiology on 10/16/22 and patient was doing well at that time.  Patient presented to the ED on 1/11 complaining of abdominal pain that had been ongoing for about 2 weeks and lower extremity edema. CT abdominal pelvis in the ED showed no significant acute findings but stable 4.6 cm ascending thoracic aortic aneurysm and stable large hiatal hernia. BNP was elevated to 303.9. hsTn 9>11. Patient was admitted to the hospital for treatment of acute on chronic HFpEF and abdominal pain. Patient was diuresed with IV lasix. Went into afib with RVR on 1/12 and was treated with IV diltiazem and IV metoprolol. Converted to NSR, IV diltiazem was stopped 1/13. Patient was set for DC on 1/15, but patient again went into afib with RVR. Treated with IV metoprolol, now on PO diltiazem and PO metoprolol. Cardiology asked to consult for afib management.   On interview, patient reports that  she has had atrial fibrillation few a few years. Prior to this admission, she was asymptomatic while in afib and believed that it was very well controlled on diltiazem. Reports excellent compliance  with eliquis. Her atrial fibrillation has gotten worse since coming into the hospital. When she is in rate controlled afib, she feels very weak and fatigued. When her HR gets elevated, she develops tightness in her chest and SOB. Denies palpitations, cough. Initially had ankle edema, but this has improved since her admission.    Past Medical History:  Diagnosis Date   Anxiety    Aortic regurgitation    Breast cancer ([place]) 08/20/2011   R breast DCIS, ER/PR +   Cataract 3 and 10/92   bilateral   Chronic diastolic CHF (congestive heart failure) (HCC)    Compression fracture of fourth lumbar vertebra (HCC)    DVT (deep venous thrombosis) ([place]) 08/2011   LL extremity    Fibromyalgia    Fracture lumbar vertebra-closed (HCC)    Fracture of thoracic vertebra, closed (HCC)    GERD (gastroesophageal reflux disease)    H/O hiatal hernia    History of blood clots    History of radiation therapy 01/2012   R breast   Hypercholesteremia    Hypertension    DR Orinda Kenner   Hypothyroidism    Mitral regurgitation    Mitral valve prolapse    Osteoporosis    PAF (paroxysmal atrial fibrillation) ([place])    a. dx 11/2016.   Pulmonary hypertension ([place])    Rib fractures    Thoracic ascending aortic aneurysm ([place])    a. followed by Dr. Prescott Gum.   Tricuspid regurgitation     Past Surgical History:  Procedure Laterality Date   ANTERIOR CERVICAL DECOMP/DISCECTOMY FUSION N/A 09/09/2015   Procedure: Anterior Cervical Decompression and Fusion Cervical seven-Thorasic one ;  Surgeon: Erline Levine, MD;  Location: [facility];  Service: Neurosurgery;  Laterality: N/A;   [place]  10/16/2006   RIH - Dr Hassell Done   KYPHOPLASTY N/A 01/26/2013   Procedure: KYPHOPLASTY;  Surgeon: Kristeen Miss, MD;  Location: [facility];  Service: Neurosurgery;  Laterality: N/A;  T11 and L1   KYPHOPLASTY N/A 06/21/2017    Procedure: Lumbar four Kyphoplasty;  Surgeon: Erline Levine, MD;  Location: [facility];  Service: Neurosurgery;  Laterality: N/A;   MASTECTOMY, PARTIAL  10/17/2011   Procedure: MASTECTOMY PARTIAL;  Surgeon: Haywood Lasso, MD;  Location: [facility];  Service: General;  Laterality: Right;  needle guided   TEE WITHOUT CARDIOVERSION N/A 12/04/2016   Procedure: TRANSESOPHAGEAL ECHOCARDIOGRAM (TEE);  Surgeon: Pixie Casino, MD;  Location: [facility];  Service: Cardiovascular;  Laterality: N/A;   TONSILLECTOMY  1944     Home Medications:  Prior to Admission medications   Medication Sig Start Date End Date Taking? Authorizing Provider  apixaban (ELIQUIS) 2.5 MG TABS tablet Take 1 tablet (2.5 mg total) by mouth 2 (two) times daily. 10/30/22  Yes Freada Bergeron, MD  Ascorbic Acid (VITA-C PO) Take 1,000 mg by mouth daily.   Yes [provider]  calcium citrate-vitamin D (CITRACAL+D) 315-200 MG-UNIT per tablet Take 1 tablet by mouth 2 (two) times daily. Patient taking differently: Take 2 tablets by mouth daily. 01/28/13  Yes [name], MD  cholecalciferol (VITAMIN D) 1000 UNITS tablet Take 2,000 Units by mouth every  evening. Vitamin D3   Yes [provider]  diltiazem (CARDIZEM CD) 180 MG 24 hr capsule Take 1 capsule daily Patient taking differently: Take 180 mg by mouth daily. Take 1 capsule daily 03/27/22  Yes Pemberton, Greer Ee, MD  Docusate Calcium (STOOL SOFTENER PO) Take 100 mg by mouth at bedtime.   Yes [provider]  estradiol (ESTRACE) 0.1 MG/GM vaginal cream Place 1 Applicatorful vaginally once a week.   Yes [provider]  furosemide (LASIX) 20 MG tablet Take one tablet by mouth three times a week Patient taking differently: Take 20 mg by mouth 3 (three) times a week. Take one tablet by mouth three times a week Mon, wed, Friday 10/16/22  Yes Conte, Tessa N, PA-C  HYDROcodone-acetaminophen (NORCO/VICODIN) 5-325 MG tablet Take 1-2 tablets by mouth every  4 (four) hours as needed (mild pain). 09/12/15  Yes Reyne Dumas, MD  iron polysaccharides (NIFEREX) 150 MG capsule Take 150 mg by mouth 3 (three) times a week. M, W, F   Yes [provider]  levothyroxine (SYNTHROID, LEVOTHROID) 125 MCG tablet Take 125 mcg by mouth daily before breakfast.   Yes [provider]  losartan (COZAAR) 25 MG tablet Take 1 tablet (25 mg total) by mouth daily. 01/02/22  Yes Freada Bergeron, MD  Meclizine HCl (BONINE PO) Take 12.5 mg by mouth daily as needed (dizziness).   Yes [provider]  Multiple Vitamin (MULTIVITAMIN) tablet Take 1 tablet by mouth daily with breakfast.   Yes [provider]  omeprazole (PRILOSEC OTC) 20 MG tablet Take 20 mg by mouth daily before breakfast.    Yes [provider]  OVER THE COUNTER MEDICATION Take 1,000 mg by mouth daily. D-Mannose 500 mg   Yes [provider]  potassium chloride (KLOR-CON M) 10 MEQ tablet Take 1 tablet (10 mEq total) by mouth daily on Mon, Wed, Fri, and Sundays, with lasix administration. Patient taking differently: Take 10 mEq by mouth 3 (three) times a week. Take 1 tablet (10 mEq total) by mouth daily on Mon, Wed, Fri,  with lasix administration. 08/21/22  Yes Freada Bergeron, MD  pravastatin (PRAVACHOL) 20 MG tablet Take 20 mg by mouth daily with supper.  09/02/11  Yes [provider]  risedronate (ACTONEL) 150 MG tablet Take 150 mg by mouth every 30 (thirty) days. with water on empty stomach, nothing by mouth or lie down for next 30 minutes.   Yes [provider]  solifenacin (VESICARE) 10 MG tablet Take 10 mg by mouth daily. 11/21/22  Yes [provider]  acetaminophen (TYLENOL) 325 MG tablet Take 2 tablets (650 mg total) by mouth every 6 (six) hours as needed for moderate pain or mild pain. 11/26/22   Arrien, Jimmy Picket, MD  bisacodyl (DULCOLAX) 5 MG EC tablet Take 1 tablet (5 mg total) by mouth daily as needed for moderate  constipation. 11/26/22   Arrien, Jimmy Picket, MD  pantoprazole (PROTONIX) 40 MG tablet Take 1 tablet twice daily for 15 days and then continue once daily. 11/26/22   Arrien, Jimmy Picket, MD  sucralfate (CARAFATE) 1 g tablet Take 1 tablet (1 g total) by mouth 4 (four) times daily -  with meals and at bedtime. Tablets may be dissolved in water before administration. 11/26/22 12/27/22  Arrien, Jimmy Picket, MD    Inpatient Medications: Scheduled Meds:  apixaban  2.5 mg Oral BID   capsicum   Topical BID   diltiazem  180 mg Oral Daily  feeding supplement  237 mL Oral BID BM   levothyroxine  125 mcg Oral Q0600   metoprolol tartrate  25 mg Oral BID   multivitamin with minerals  1 tablet Oral Daily   pantoprazole  40 mg Oral BID   polyethylene glycol  17 g Oral Daily   pravastatin  20 mg Oral Q supper   senna-docusate  1 tablet Oral BID   sucralfate  1 g Oral TID WC & HS   Continuous Infusions:  PRN Meds: acetaminophen, bisacodyl, HYDROcodone-acetaminophen, meclizine, morphine injection, mouth rinse, simethicone  Allergies:    Allergies  Allergen Reactions   Contrast Media [Iodinated Contrast Media] Shortness Of Breath   Iohexol Shortness Of Breath   Mucinex [Guaifenesin Er] Other (See Comments)    DIZZINESS   Zanaflex [Tizanidine Hcl] Anxiety and Other (See Comments)    Dizziness   Penicillins Rash    Has patient had a PCN reaction causing immediate rash, facial/tongue/throat swelling, SOB or lightheadedness with hypotension: No Has patient had a PCN reaction causing severe rash involving mucus membranes or skin necrosis: No Has patient had a PCN reaction that required hospitalization No Has patient had a PCN reaction occurring within the last 10 years: No If all of the above answers are "NO", then may proceed with Cephalosporin use.    Robaxin [Methocarbamol] Diarrhea    Social History:   Social History   Socioeconomic History   Marital status: Married    Spouse  name: Not on file   Number of children: Not on file   Years of education: Not on file   Highest education level: Not on file  Occupational History   Not on file  Tobacco Use   Smoking status: Never   Smokeless tobacco: Never  Vaping Use   Vaping Use: Never used  Substance and Sexual Activity   Alcohol use: No   Drug use: No   Sexual activity: Never  Other Topics Concern   Not on file  Social History Narrative   Not on file   Social Determinants of Health   Financial Resource Strain: Not on file  Food Insecurity: No Food Insecurity (11/22/2022)   Hunger Vital Sign    Worried About Running Out of Food in the Last Year: Never true    Ran Out of Food in the Last Year: Never true  Transportation Needs: No Transportation Needs (11/22/2022)   PRAPARE - Hydrologist (Medical): No    Lack of Transportation (Non-Medical): No  Physical Activity: Not on file  Stress: Not on file  Social Connections: Not on file  Intimate Partner Violence: Not At Risk (11/22/2022)   Humiliation, Afraid, Rape, and Kick questionnaire    Fear of Current or Ex-Partner: No    Emotionally Abused: No    Physically Abused: No    Sexually Abused: No    Family History:    Family History  Problem Relation Age of Onset   Heart disease Mother    Heart disease Maternal Uncle    Heart disease Maternal Grandfather      ROS:  Please see the history of present illness.   All other ROS reviewed and negative.     Physical Exam/Data:   Vitals:   11/27/22 2200 11/28/22 0410 11/28/22 0520 11/28/22 0822  BP:  (!) 95/57 107/82 112/86  Pulse:  75 97 (!) 112  Resp:  '15 13 20  '$ Temp:  97.8 F (36.6 C)  97.8 F (36.6 C)  TempSrc:  Oral  Oral  SpO2: 96% 93% 97% 98%  Weight:  45.6 kg    Height:        Intake/Output Summary (Last 24 hours) at 11/28/2022 1001 Last data filed at 11/28/2022 0830 Gross per 24 hour  Intake 100 ml  Output 1200 ml  Net -1100 ml      11/28/2022    4:10  AM 11/27/2022    4:43 AM 11/26/2022    1:04 AM  Last 3 Weights  Weight (lbs) 100 lb 8.5 oz 110 lb 10.7 oz 100 lb 12 oz  Weight (kg) 45.6 kg 50.2 kg 45.7 kg     Body mass index is 20.3 kg/m.  General:  Frail, elderly female. Laying in the bed in no acute distress HEENT: normal Neck: no JVD  Vascular: Radial pulses 2+ bilaterally  Cardiac:  normal S1, S2; irregular rate and rhythm, grade 2/6 systolic murmur  Lungs:  clear to auscultation bilaterally. Normal WOB on room air   Abd: soft, nontender, no hepatomegaly  Ext: Trace edema in BLE Musculoskeletal:  No deformities, BUE and BLE strength normal and equal Skin: warm and dry  Neuro:  CNs 2-12 intact, no focal abnormalities noted Psych:  Normal affect   EKG:  The EKG was personally reviewed and demonstrates:  Atrial fibrillation, HR 118 BPM  Telemetry:  Telemetry was personally reviewed and demonstrates:  Atrial fibrillation, HR elevated to the 130s-140s overnight. Now, HR is in the 90s-100s.   Relevant CV Studies:  Echocardiogram 06/05/22  1. Left ventricular ejection fraction, by estimation, is 60 to 65%. The  left ventricle has normal function. The left ventricle has no regional  wall motion abnormalities. There is severe asymmetric left ventricular  hypertrophy of the basal-septal  segment. Left ventricular diastolic parameters are indeterminate.   2. There is systolic anterior motion of the mitral valve but most of MR  is related to atrial dilation and bi-leaflet prolapse.   3. Right ventricular systolic function is normal. The right ventricular  size is severely enlarged. There is severely elevated pulmonary artery  systolic pressure. The estimated right ventricular systolic pressure is  87.5 mmHg.   4. Left atrial size was severely dilated.   5. Right atrial size was severely dilated.   6. The mitral valve is abnormal. Severe mitral valve regurgitation.   7. Tricuspid valve regurgitation is severe.   8. The aortic valve  is tricuspid. There is mild calcification of the  aortic valve. Aortic valve regurgitation is mild to moderate. No aortic  stenosis is present.   9. Aortic dilatation noted. There is severe dilatation of the ascending  aorta, measuring 50 mm. There is moderate dilatation of the aortic arch,  measuring 45 mm.   Comparison(s): Further aortic dilation, worsening tricuspid and mitral  regurgitation.    Laboratory Data:  High Sensitivity Troponin:   Recent Labs  Lab 11/22/22 1414 11/22/22 1837  TROPONINIHS 9 11     Chemistry Recent Labs  Lab 11/24/22 0036 11/25/22 0109 11/28/22 0101  NA 132* 132* 134*  K 4.8 4.1 4.3  CL 98 99 102  CO2 '27 25 26  '$ GLUCOSE 108* 102* 100*  BUN 26* 20 20  CREATININE 0.77 0.77 0.89  CALCIUM 8.7* 8.6* 8.5*  MG  --   --  2.1  GFRNONAA >60 >60 >60  ANIONGAP '7 8 6    '$ Recent Labs  Lab 11/22/22 1107  PROT 6.5  ALBUMIN 3.7  AST 30  ALT 25  ALKPHOS 54  BILITOT 0.6   Lipids No results for input(s): "CHOL", "TRIG", "HDL", "LABVLDL", "LDLCALC", "CHOLHDL" in the last 168 hours.  Hematology Recent Labs  Lab 11/22/22 1107  WBC 8.1  RBC 4.25  HGB 12.8  HCT 36.4  MCV 85.6  MCH 30.1  MCHC 35.2  RDW 13.5  PLT 186   Thyroid No results for input(s): "TSH", "FREET4" in the last 168 hours.  BNP Recent Labs  Lab 11/22/22 1107  BNP 303.9*    DDimer No results for input(s): "DDIMER" in the last 168 hours.   Radiology/Studies:  No results found.   Assessment and Plan:   Paroxysmal Atrial Fibrillation - Patient has a history of atrial fibrillation. Before coming to the hospital, patient was asymptomatic when in afib and felt her afib was well controlled. Since coming to the hospital, she feels more aware of her afib. Feels weak and fatigued, and develops chest tightness and SOB when in RVR  - Currently being treated with PO metoprolol 25 mg BIB, cardizem CD 180 mg daily. She has been in and out of afib this admission. Per telemetry, she was  in afib with HR in the 130s-140s overnight. Now in afib with HR in the 90s-100s - Even when her HR is better controlled, she feels weak and fatigued.  - BP somewhat soft, no room to further titrate AV nodal blocking medications  - Most recent echocardiogram from 05/2022 showed EF 60-65%, severely dilated LA and RA, severe mitral valve regurgitation, severe tricuspid valve regurgitation. Given valvular disease and severe atrial enlargement, patient may require AAD to maintain normal sinus rhythm - No history of COPD, does have hypothyroidism. Consider starting IV amiodarone  - Patient reports compliance with eliquis. Continue 2.5 mg BID (dose reduced for age, weight)   Acute on chronic diastolic heart failure - Echo from 05/2022 showed EF 60-65%, no regional wall motion abnormalities, severe asymmetric lvh  - Patient presented complaining of ankle edema. BNP was elevated to 566 - She was treated with IV lasix, now off diuretics. Ankle edema improved - Resume home lasix dosing- was on lasix 20 mg MWF at home (note, patient had dizziness when taking lasix daily)   Valvular Disease  - Most recent echocardiogram from 05/2022 showed severe mitral valve regurgitation with bi-leaflet prolapse. Also showed severe tricuspid valve regurgitation, mild-moderate AI - Patient was evaluated by Dr. Burt Knack in 05/2021-- at that time, patient was not interested in further evaluation or treatment of mitral regurgitation. It was noted that she had very advanced kyphoscoliosis, and Dr. Burt Knack was concerned with how she would handle TEE, cardiac catheterization, and general anesthesia. Recommended conservative approach   Thoracic Aortic Aneurysm  - Echo in 05/2022 showed severe dilation of the ascending aorta measuring 50 mm and moderate dilation of the aortic arch measuring 45 mm  - CT chest abdomen pelvis on admission showed a stable 4.6 cm ascending thoracic aortic aneurysm.  - Patient has been seen by Cardiothoracic  Surgery-- last seen in 04/2021 by Dr. Orvan Seen. At that time, patient reported wanting to avoid surgery under any conditions.  - Managed with BP control- BP is well controlled this admission   Risk Assessment/Risk Scores:    CHA2DS2-VASc Score = 6   This indicates a 9.7% annual risk of stroke. The patient's score is based upon: CHF History: 1 HTN History: 1 Diabetes History: 0 Stroke History: 0 Vascular Disease History: 1 Age Score: 2 Gender Score: 1   For questions or updates, please  contact [place] Please consult [email or web address] for contact info under    Signed, Margie Billet, PA-C  11/28/2022 10:01 AM

## 2022-11-29 ENCOUNTER — Other Ambulatory Visit (HOSPITAL_COMMUNITY): Payer: Self-pay

## 2022-11-29 DIAGNOSIS — K449 Diaphragmatic hernia without obstruction or gangrene: Secondary | ICD-10-CM | POA: Diagnosis not present

## 2022-11-29 DIAGNOSIS — I5032 Chronic diastolic (congestive) heart failure: Secondary | ICD-10-CM | POA: Diagnosis not present

## 2022-11-29 DIAGNOSIS — I340 Nonrheumatic mitral (valve) insufficiency: Secondary | ICD-10-CM | POA: Diagnosis not present

## 2022-11-29 DIAGNOSIS — I10 Essential (primary) hypertension: Secondary | ICD-10-CM | POA: Diagnosis not present

## 2022-11-29 DIAGNOSIS — I7121 Aneurysm of the ascending aorta, without rupture: Secondary | ICD-10-CM | POA: Diagnosis not present

## 2022-11-29 DIAGNOSIS — I361 Nonrheumatic tricuspid (valve) insufficiency: Secondary | ICD-10-CM

## 2022-11-29 DIAGNOSIS — I480 Paroxysmal atrial fibrillation: Secondary | ICD-10-CM | POA: Diagnosis not present

## 2022-11-29 LAB — URINE CULTURE

## 2022-11-29 LAB — CBC
HCT: 39.8 % (ref 36.0–46.0)
Hemoglobin: 14.3 g/dL (ref 12.0–15.0)
MCH: 30.8 pg (ref 26.0–34.0)
MCHC: 35.9 g/dL (ref 30.0–36.0)
MCV: 85.6 fL (ref 80.0–100.0)
Platelets: 235 10*3/uL (ref 150–400)
RBC: 4.65 MIL/uL (ref 3.87–5.11)
RDW: 13.6 % (ref 11.5–15.5)
WBC: 10 10*3/uL (ref 4.0–10.5)
nRBC: 0.2 % (ref 0.0–0.2)

## 2022-11-29 LAB — COMPREHENSIVE METABOLIC PANEL
ALT: 24 U/L (ref 0–44)
AST: 26 U/L (ref 15–41)
Albumin: 2.8 g/dL — ABNORMAL LOW (ref 3.5–5.0)
Alkaline Phosphatase: 60 U/L (ref 38–126)
Anion gap: 6 (ref 5–15)
BUN: 14 mg/dL (ref 8–23)
CO2: 28 mmol/L (ref 22–32)
Calcium: 9 mg/dL (ref 8.9–10.3)
Chloride: 99 mmol/L (ref 98–111)
Creatinine, Ser: 0.81 mg/dL (ref 0.44–1.00)
GFR, Estimated: >60 mL/min (ref >60)
Glucose, Bld: 120 mg/dL — ABNORMAL HIGH (ref 70–99)
Potassium: 4.1 mmol/L (ref 3.5–5.1)
Sodium: 133 mmol/L — ABNORMAL LOW (ref 135–145)
Total Bilirubin: 0.7 mg/dL (ref 0.3–1.2)
Total Protein: 5.3 g/dL — ABNORMAL LOW (ref 6.5–8.1)

## 2022-11-29 LAB — BRAIN NATRIURETIC PEPTIDE: B Natriuretic Peptide: 789.3 pg/mL — ABNORMAL HIGH (ref 0.0–100.0)

## 2022-11-29 LAB — TSH: TSH: 2.071 u[IU]/mL (ref 0.350–4.500)

## 2022-11-29 MED ORDER — POLYETHYLENE GLYCOL 3350 17 G PO PACK
17.0000 g | PACK | Freq: Every day | ORAL | Status: DC | PRN
  Administered 2022-11-30: 17 g via ORAL
  Filled 2022-11-29: qty 1

## 2022-11-29 NOTE — Progress Notes (Signed)
   11/29/22 1000  Mobility  Activity Transferred to/from [facility]  Level of Assistance Minimal assist, patient does 75% or more  Assistive Device Front wheel walker  Distance Ambulated (ft) 2 ft  Activity Response Tolerated well  Mobility Referral Yes  $Mobility charge 1 Mobility   Mobility Specialist Progress Note  Pt requesting to get get back in bed. Peri care provided. Left in bed w/ all needs met and call bell in reach.   [name] Mobility Specialist  Please contact via SecureChat or Rehab office at [phone]

## 2022-11-29 NOTE — Progress Notes (Signed)
Rounding Note    Patient Name: [name] Date of Encounter: 11/29/2022  [facility] Cardiologist: Freada Bergeron, MD   Subjective   Denies any CP. States she gives out easily  Inpatient Medications    Scheduled Meds:  amiodarone  200 mg Oral BID   [START ON 12/13/2022] amiodarone  200 mg Oral Daily   apixaban  2.5 mg Oral BID   diltiazem  180 mg Oral Daily   feeding supplement  237 mL Oral BID BM   levothyroxine  125 mcg Oral Q0600   metoprolol tartrate  25 mg Oral BID   multivitamin with minerals  1 tablet Oral Daily   pantoprazole  40 mg Oral BID   pravastatin  20 mg Oral Q supper   senna-docusate  1 tablet Oral BID   sucralfate  1 g Oral TID WC & HS   Continuous Infusions:  PRN Meds: acetaminophen, bisacodyl, HYDROcodone-acetaminophen, meclizine, morphine injection, mouth rinse, polyethylene glycol, simethicone   Vital Signs    Vitals:   11/28/22 2330 11/29/22 0100 11/29/22 0447 11/29/22 0842  BP: 117/89 117/85  105/74  Pulse: (!) 115 89  68  Resp: '20 20  14  '$ Temp: 97.8 F (36.6 C) 97.8 F (36.6 C)    TempSrc: Oral Oral    SpO2: 98%   98%  Weight:   47.7 kg   Height:        Intake/Output Summary (Last 24 hours) at 11/29/2022 1012 Last data filed at 11/28/2022 2036 Gross per 24 hour  Intake 287 ml  Output 500 ml  Net -213 ml      11/29/2022    4:47 AM 11/28/2022    4:10 AM 11/27/2022    4:43 AM  Last 3 Weights  Weight (lbs) 105 lb 2.6 oz 100 lb 8.5 oz 110 lb 10.7 oz  Weight (kg) 47.7 kg 45.6 kg 50.2 kg      Telemetry    Atrial fibrillation with HR 80s - Personally Reviewed  ECG    Atrial fibrillation with HR 110s - Personally Reviewed  Physical Exam   GEN: No acute distress.   Neck: No JVD Cardiac: irregularly irregular, no murmurs, rubs, or gallops.  Respiratory: Clear to auscultation bilaterally. GI: Soft, nontender, non-distended  MS: No edema; No deformity. Neuro:  Nonfocal  Psych: Normal affect   Labs     High Sensitivity Troponin:   Recent Labs  Lab 11/22/22 1414 11/22/22 1837  TROPONINIHS 9 11     Chemistry Recent Labs  Lab 11/22/22 1107 11/23/22 0053 11/25/22 0109 11/28/22 0101 11/29/22 0055  NA 132*   < > 132* 134* 133*  K 4.0   < > 4.1 4.3 4.1  CL 99   < > 99 102 99  CO2 23   < > '25 26 28  '$ GLUCOSE 130*   < > 102* 100* 120*  BUN 28*   < > '20 20 14  '$ CREATININE 0.75   < > 0.77 0.89 0.81  CALCIUM 9.2   < > 8.6* 8.5* 9.0  MG  --   --   --  2.1  --   PROT 6.5  --   --   --  5.3*  ALBUMIN 3.7  --   --   --  2.8*  AST 30  --   --   --  26  ALT 25  --   --   --  24  ALKPHOS 54  --   --   --  60  BILITOT 0.6  --   --   --  0.7  GFRNONAA >60   < > >60 >60 >60  ANIONGAP 10   < > '8 6 6   '$ < > = values in this interval not displayed.    Lipids No results for input(s): "CHOL", "TRIG", "HDL", "LABVLDL", "LDLCALC", "CHOLHDL" in the last 168 hours.  Hematology Recent Labs  Lab 11/22/22 1107 11/29/22 0055  WBC 8.1 10.0  RBC 4.25 4.65  HGB 12.8 14.3  HCT 36.4 39.8  MCV 85.6 85.6  MCH 30.1 30.8  MCHC 35.2 35.9  RDW 13.5 13.6  PLT 186 235   Thyroid  Recent Labs  Lab 11/29/22 0055  TSH 2.071    BNP Recent Labs  Lab 11/22/22 1107  BNP 303.9*    DDimer No results for input(s): "DDIMER" in the last 168 hours.   Radiology    No results found.  Cardiac Studies   Echo 06/05/2022 1. Left ventricular ejection fraction, by estimation, is 60 to 65%. The  left ventricle has normal function. The left ventricle has no regional  wall motion abnormalities. There is severe asymmetric left ventricular  hypertrophy of the basal-septal  segment. Left ventricular diastolic parameters are indeterminate.   2. There is systolic anterior motion of the mitral valve but most of MR  is related to atrial dilation and bi-leaflet prolapse.   3. Right ventricular systolic function is normal. The right ventricular  size is severely enlarged. There is severely elevated pulmonary artery   systolic pressure. The estimated right ventricular systolic pressure is  92.4 mmHg.   4. Left atrial size was severely dilated.   5. Right atrial size was severely dilated.   6. The mitral valve is abnormal. Severe mitral valve regurgitation.   7. Tricuspid valve regurgitation is severe.   8. The aortic valve is tricuspid. There is mild calcification of the  aortic valve. Aortic valve regurgitation is mild to moderate. No aortic  stenosis is present.   9. Aortic dilatation noted. There is severe dilatation of the ascending  aorta, measuring 50 mm. There is moderate dilatation of the aortic arch,  measuring 45 mm.   Comparison(s): Further aortic dilation, worsening tricuspid and mitral  regurgitation.   Patient Profile     87 y.o. female with PMH of PAF on Eliquis, chronic diastolic CHF, mitral valve prolapse with severe MR, HTN, HLD, severe pulm HTN, severe TR, thoracic aortic aneurysm (followed by CT surgery), chronic back pain and severe kyphosis presented with 2 week onset of abdominal pain and LE edema. She was admitted with acute on chronic diastolic CHF and underwent IV diuresis. During hospitalization, she had intermittent afib with RVR  Assessment & Plan    Paroxysmal atrial fibrillation  - severe left atrial enlargement seen on previous echo in July 2023 - intermittent afib during this admission with SOB and chest tightness  - states she has been very weak since admission (even when she came out of afib a few days ago), her weakness is unlikely to be related to afib. HR well controlled, continue '200mg'$  BID of amiodarone for loading, will decrease to '200mg'$  daily in a few weeks.   Acute on chronic diastolic CHF  - presented with leg edema. BNP elevated at 566  - underwent IV diuresis. Off of diuretic, previously on three times weekly dosing of '20mg'$  lasix. Euvolemic on exam. Obtain BNP. Given persistent weakness, will hold off on reinitiating diuretic. Likely discharge later on  PRN  $'20mg'x$  dosing.   Valvular disease: patient did not wish for any surgery  Thoracic aortic aneurysm: Previously evaluated by CT surgery, patient reported wanting to avoid surgery under any condition  Weakness: she says she is independent at home prior to coming to the hospital. She has a helper who comes to her home Mon-Fri until 1PM and she takes care of herself at home. She blamed her weakness to different medication schedule in the hospital, but I suspect it is likely to related to deconditioning.       For questions or updates, please contact [place] Please consult [email or web address] for contact info under        Signed, Almyra Deforest, [place]  11/29/2022, 10:12 AM

## 2022-11-29 NOTE — Progress Notes (Signed)
PROGRESS NOTE    [name]  FOY:[id number] DOB: [birth year] DOA: 11/22/2022 PCP: Corliss Blacker, MD  86/F w/l history of P Afib, hypertension, chronic diastolic CHF, thoracic aortic aneurysm, pulmonary hypertension, hiatal hernia and chronic back pain who presented with abdominal pain. Patient had 2 weeks of abdominal pain, upper region, epigastric, associated with nausea but not vomiting, w/ constipation. ED visit 5 days ago, diagnosed with UTI and constipation and discharged home. -Returned to ED w/ persistent symptoms, cr 0,75 ,BNP 303, Wbc 8,1 hgb 12.8 , UA 21-50 wbc. CXR w/ no infiltrates or effusion, mild cardiomegaly, significant scoliosis and lateral film with significant kyphosis.  -Patient was placed on IV furosemide for volume overload -1/12 developed atrial fibrillation with RVR and required diltiazem drip.  -1/13 converted to sinus rhythm, her abdominal pain improving with PPI and anti reflux measures.  remained weak and deconditioned.  -1/16 discharge cancelled due to atrial fibrillation with RVR. Metop added -1/17: Feels tired, A-fib RVR persists, cardiology consulted, started on p.o. amiodarone  Subjective: -Reports not feeling too good, heart rate is better, denies dyspnea, some nausea, no vomiting  Assessment and Plan:  Hiatal hernia Abd pain -large hiatal hernia on imaging w/ with significant reflux, triggering abdominal pain and nausea.  Along with constipation -2 CT scans last week 1/6 and 1/11, notable for large hiatal hernia, constipation, significant scoliosis and degenerative spine disease -placed on pantoprazole and sucralfate, along with anti reflux measures (small bites and seating when eating), some improvement noted -Multiple BMs after laxatives, change MiraLAX to PRN -she is considerably frail and debilitated she declines rehab, plan for DC home w/ [facility] services when stable, likely tomorrow  PAF (paroxysmal atrial fibrillation) (HCC) -required IV  diltiazem and IV metoprolol to control rapid ventricular response atrial fibrillation, then converted to sinus rhythm, back on her regular dose of diltiazem,  -1/16 back w/ AFib RVR- continue diltiazem, Dr. Cathlean Sauer added metoprolol -continue apixaban -Recurrent A-fib RVR A-fib RVR-appreciate cardiology input, this would be challenging considering significant valvular heart disease, started on oral amiodarone yesterday, will need to watch for GI symptoms since this has been one of her main problems, heart rate improving  Chronic diastolic CHF (congestive heart failure) ([place]) Severe MR and TR 05/2022 echo with EF 60 to 65%, severe asymmetric ventricular hypertrophy of the basal septal segment. RV normal. Severe dilatation of LA and RA.  Severe mitral valve regurgitation. Severe TR regurgitation.  -Diuresed with IV Lasix, she is 5.1 L negative -Now on low-dose oral Lasix, GDMT limited by soft BPs -Not a candidate for surgical intervention for her valvular heart disease  History of DVT of lower extremity Continue anticoagulation with apixaban.,   Hypokalemia Hyponatremia,  -monitor w/ diuretics  DVT prophylaxis: apixaban Code Status: DNR Family Communication: None present Disposition Plan: Home with home health services tomorrow  Consultants: Cards   Procedures:   Antimicrobials:    Objective: Vitals:   11/28/22 2330 11/29/22 0100 11/29/22 0447 11/29/22 0842  BP: 117/89 117/85  105/74  Pulse: (!) 115 89  68  Resp: '20 20  14  '$ Temp: 97.8 F (36.6 C) 97.8 F (36.6 C)    TempSrc: Oral Oral    SpO2: 98%   98%  Weight:   47.7 kg   Height:        Intake/Output Summary (Last 24 hours) at 11/29/2022 1010 Last data filed at 11/28/2022 2036 Gross per 24 hour  Intake 287 ml  Output 500 ml  Net -213 ml  Filed Weights   11/27/22 0443 11/28/22 0410 11/29/22 0447  Weight: 50.2 kg 45.6 kg 47.7 kg    Examination: Frail cachectic chronically ill female with significant  kyphoscoliosis, AAOx3 CVS: S1-S2, irregularly irregular rhythm, tachycardic Lungs: Decreased breath sounds at the bases Abdomen: Soft, nontender, bowel sounds present Extremities: No edema    Data Reviewed:   CBC: Recent Labs  Lab 11/22/22 1107 11/29/22 0055  WBC 8.1 10.0  NEUTROABS 6.2  --   HGB 12.8 14.3  HCT 36.4 39.8  MCV 85.6 85.6  PLT 186 262   Basic Metabolic Panel: Recent Labs  Lab 11/23/22 0053 11/24/22 0036 11/25/22 0109 11/28/22 0101 11/29/22 0055  NA 135 132* 132* 134* 133*  K 3.4* 4.8 4.1 4.3 4.1  CL 98 98 99 102 99  CO2 '26 27 25 26 28  '$ GLUCOSE 100* 108* 102* 100* 120*  BUN 24* 26* '20 20 14  '$ CREATININE 0.69 0.77 0.77 0.89 0.81  CALCIUM 8.8* 8.7* 8.6* 8.5* 9.0  MG  --   --   --  2.1  --    GFR: Estimated Creatinine Clearance: 35.3 mL/min (by C-G formula based on SCr of 0.81 mg/dL). Liver Function Tests: Recent Labs  Lab 11/22/22 1107 11/29/22 0055  AST 30 26  ALT 25 24  ALKPHOS 54 60  BILITOT 0.6 0.7  PROT 6.5 5.3*  ALBUMIN 3.7 2.8*   Recent Labs  Lab 11/22/22 1107  LIPASE 43   No results for input(s): "AMMONIA" in the last 168 hours. Coagulation Profile: No results for input(s): "INR", "PROTIME" in the last 168 hours. Cardiac Enzymes: No results for input(s): "CKTOTAL", "CKMB", "CKMBINDEX", "TROPONINI" in the last 168 hours. BNP (last 3 results) No results for input(s): "PROBNP" in the last 8760 hours. HbA1C: No results for input(s): "HGBA1C" in the last 72 hours. CBG: No results for input(s): "GLUCAP" in the last 168 hours. Lipid Profile: No results for input(s): "CHOL", "HDL", "LDLCALC", "TRIG", "CHOLHDL", "LDLDIRECT" in the last 72 hours. Thyroid Function Tests: Recent Labs    11/29/22 0055  TSH 2.071   Anemia Panel: No results for input(s): "VITAMINB12", "FOLATE", "FERRITIN", "TIBC", "IRON", "RETICCTPCT" in the last 72 hours. Urine analysis:    Component Value Date/Time   COLORURINE YELLOW 11/22/2022 [place] 11/22/2022 1247   LABSPEC 1.014 11/22/2022 1247   PHURINE 6.0 11/22/2022 1247   GLUCOSEU NEGATIVE 11/22/2022 1247   HGBUR NEGATIVE 11/22/2022 1247   BILIRUBINUR NEGATIVE 11/22/2022 1247   KETONESUR NEGATIVE 11/22/2022 1247   PROTEINUR NEGATIVE 11/22/2022 1247   UROBILINOGEN 0.2 09/04/2015 1404   NITRITE NEGATIVE 11/22/2022 1247   LEUKOCYTESUR LARGE (A) 11/22/2022 1247   Sepsis Labs: '@LABRCNTIP'$ (procalcitonin:4,lacticidven:4)  )No results found for this or any previous visit (from the past 240 hour(s)).   Radiology Studies: No results found.   Scheduled Meds:  amiodarone  200 mg Oral BID   [START ON 12/13/2022] amiodarone  200 mg Oral Daily   apixaban  2.5 mg Oral BID   diltiazem  180 mg Oral Daily   feeding supplement  237 mL Oral BID BM   levothyroxine  125 mcg Oral Q0600   metoprolol tartrate  25 mg Oral BID   multivitamin with minerals  1 tablet Oral Daily   pantoprazole  40 mg Oral BID   polyethylene glycol  17 g Oral Daily   pravastatin  20 mg Oral Q supper   senna-docusate  1 tablet Oral BID   sucralfate  1 g Oral TID  WC & HS   Continuous Infusions:   LOS: 7 days    Time spent: 10mn    PDomenic Polite MD [facility]   11/29/2022, 10:10 AM

## 2022-11-29 NOTE — Progress Notes (Signed)
Occupational Therapy Treatment Patient Details Name: [name] MRN: [id number] DOB: 1936/08/15 Today's Date: 11/29/2022   History of present illness Pt is an 87 y.o. female admitted 11/22/22 with abrominal pain; workup for hiatal hernia. Pt developed afib with RVR 1/12. Of note, recent ED visit 5 days prior with UTI dx. PMH includes CHF, afib, HTN, TAA, pulmonary HTN, chronic back pain.   OT comments  Attempted OT multiple times this date. Upon OT arrival, pt was just standing from 3:1 and requesting to get back to bed. Pt required Min A step pivot transfer. OT discussed OT goals with pt and asked if she would be agreeable to dressing, grooming or sitting up in chair to assist with increasing activity tolerance to which pt declined. Pt was agreeable to OT assisting her back to bed. She required Mod assist to elevate her trunk and reposition in bed with increased time overall. Pt was encouraged to use bed rails to assist and to use LE's to scoot up in bed. Pt declined any further acute OT at this time despite verbal encouragement to participate with focus of increasing activity tolerance/endurance.   Recommendations for follow up therapy are one component of a multi-disciplinary discharge planning process, led by the attending physician.  Recommendations may be updated based on patient status, additional functional criteria and insurance authorization.    Follow Up Recommendations  Skilled nursing-short term rehab (<3 hours/day)     Assistance Recommended at Discharge Frequent or constant Supervision/Assistance  Patient can return home with the following  A little help with walking and/or transfers;A little help with bathing/dressing/bathroom;Assistance with cooking/housework;Help with stairs or ramp for entrance;Assist for transportation   Equipment Recommendations  None recommended by OT (Pt reports having necessary DME)    Recommendations for Other Services      Precautions /  Restrictions Precautions Precautions: Fall;Other (comment) Precaution Comments: urinary frequency; initiated educ on abdominal prec for comfort       Mobility Bed Mobility Overal bed mobility: Needs Assistance Bed Mobility: Rolling, Sit to Supine Rolling: Min assist     Sit to supine: Mod assist   General bed mobility comments: Assist with trunk to reposition in bed and scoot to [facility] with increased time and heavy use of rail. Pt required moderate verbaly encouragement to participate with repositioning    Transfers   General transfer comment: Pt was observed transferring from 3:1 (Min A) and requesting getting back into bed.     Balance       ADL either performed or assessed with clinical judgement   ADL Overall ADL's : Needs assistance/impaired Eating/Feeding: Modified independent;Bed level Eating/Feeding Details (indicate cue type and reason): drinking water   Grooming Details (indicate cue type and reason): Pt declined grooming   Toilet Transfer: Minimal assistance;Stand-pivot;BSC/3in1 Toilet Transfer Details (indicate cue type and reason): Pt was standing from 3:1 upon OT arrival requesting to get back to bed. Min A     Functional mobility during ADLs: Minimal assistance;Rolling walker (2 wheels) General ADL Comments: Attempted OT multiple times this date. Upon OT arrival, pt was just standing from 3:1 and requesting to get back to bed. Pt required Min A step pivot transfer. OT discussed OT goals with pt and asked if she would be agreeable to dressing, grooming or sitting up in chair to assist with increasing activity tolerance to which pt declined. Pt was agreeable to OT assisting her back to bed. She required Mod assist to elevate her trunk and reposition in bed  with increased time overall. Pt was encouraged to use bed rails to assist and to use LE's to scoot up in bed. Pt declined any further acute OT at this time despite verbal encouragement to participate with focus of  increasing activity tolerance/endurance.   Extremity/Trunk Assessment Upper Extremity Assessment Upper Extremity Assessment: Generalized weakness   Lower Extremity Assessment Lower Extremity Assessment: Defer to PT evaluation        Vision Baseline Vision/History: 1 Wears glasses Ability to See in Adequate Light: 0 Adequate Patient Visual Report: No change from baseline            Cognition Arousal/Alertness: Awake/alert Behavior During Therapy:  (Irritated) Overall Cognitive Status: Within Functional Limits for tasks assessed   General Comments: WFL for simple tasks, not formally assessed. multiple complaints                   Pertinent Vitals/ Pain       Pain Assessment Pain Assessment: Faces Faces Pain Scale: Hurts little more Pain Location: back, abdomen, generalized Pain Descriptors / Indicators: Guarding, Grimacing Pain Intervention(s): Repositioned, Monitored during session  [place] expects to be discharged to:: Private residence Living Arrangements: Alone Available Help at Discharge: Personal care attendant Type of Home: House Home Access: Ramped entrance     Home Layout: One level     Bathroom Shower/Tub: Hospital doctor Toilet: Handicapped height     Home Equipment: BSC/3in1;Shower seat;Rolling Environmental consultant (2 wheels);Hand held shower head   Additional Comments: PCA 5x/wk from 9A-1P      Prior Functioning/Environment   Lives alone. Has PCA in mornings (Please see initial OT Eval for details).   Frequency  Min 2X/week        Progress Toward Goals  OT Goals(current goals can now be found in the care plan section)  Progress towards OT goals: Progressing toward goals (Slow progression, decreaesd activity tolerance and increased assist needed for bed mobilty noted)  Acute Rehab OT Goals Patient Stated Goal: Pt stating that she is not ready to go home OT Goal Formulation: With patient Time For Goal Achievement:  12/08/22 Potential to Achieve Goals: [place] Frequency remains appropriate;Discharge plan needs to be updated       AM-PAC OT "6 Clicks" Daily Activity     Outcome Measure   Help from another person eating meals?: None Help from another person taking care of personal grooming?: A Little Help from another person toileting, which includes using toliet, bedpan, or urinal?: A Little Help from another person bathing (including washing, rinsing, drying)?: A Lot Help from another person to put on and taking off regular upper body clothing?: A Little Help from another person to put on and taking off regular lower body clothing?: A Lot 6 Click Score: 17    End of Session    OT Visit Diagnosis: Unsteadiness on feet (R26.81);Other abnormalities of gait and mobility (R26.89);Muscle weakness (generalized) (M62.81)   Activity Tolerance Patient limited by fatigue;Patient limited by pain (Pt reports overall fatigue and pain as limiting factor)   Patient Left in bed;with bed alarm set   Nurse Communication Other (comment) (Pt requesting Pure wick to be reapplied - spoke with NT)        Time: 3151-7616 OT Time Calculation (min): 17 min  Charges: OT General Charges $OT Visit: 1 Visit OT Treatments $Self Care/Home Management : 8-22 mins   Loys [place]elton Beth Dixon, OTR/L 11/29/2022, 11:19 AM

## 2022-11-29 NOTE — Progress Notes (Signed)
Physical Therapy Treatment Patient Details Name: [name] MRN: [id number] DOB: [birth year] Today's Date: 11/29/2022   History of Present Illness Pt is an 87 y.o. female admitted 11/22/22 with abrominal pain; workup for hiatal hernia. Pt developed afib with RVR 1/12. Of note, recent ED visit 5 days prior with UTI dx. PMH includes CHF, afib, HTN, TAA, pulmonary HTN, chronic back pain.    PT Comments    Pt seen for PT tx with pt received in bed, requiring encouragement & education re: participation in PT. Pt reports prior to admission she was mod I with RW, not ever sitting for more than 30 minutes at a time. On this date, pt requires min assist for bed mobility with use of hospital bed features PRN. Pt requires educational cuing re: hand placement & technique for STS with RW with pt able to complete with CGA. Pt ambulates short distance in room with RW & min assist with impaired gait pattern (noted below) & impaired balance. During mobility pt reports she's very fearful of falling. PT educated pt on updated recommendation of SNF rehab vs HHPT as pt is absolutely unsafe & unable to care for herself when aide is not present during the day. Pt resistant to all education throughout session & unsure if pt is agreeable to SNF at d/c. Team made aware of updated recommendations.    Recommendations for follow up therapy are one component of a multi-disciplinary discharge planning process, led by the attending physician.  Recommendations may be updated based on patient status, additional functional criteria and insurance authorization.  Follow Up Recommendations  Skilled nursing-short term rehab (<3 hours/day) Can patient physically be transported by private vehicle: No   Assistance Recommended at Discharge Frequent or constant Supervision/Assistance  Patient can return home with the following A lot of help with bathing/dressing/bathroom;A little help with walking and/or transfers;Assistance with  cooking/housework;Assist for transportation;Help with stairs or ramp for entrance   Equipment Recommendations  None recommended by PT    Recommendations for Other Services       Precautions / Restrictions Precautions Precautions: Fall;Other (comment) Precaution Comments: urinary frequency; initiated educ on abdominal prec for comfort Restrictions Weight Bearing Restrictions: No     Mobility  Bed Mobility Overal bed mobility: Needs Assistance Bed Mobility: Supine to Sit, Sit to Supine   Sidelying to sit: Min assist, HOB elevated (use of bed rail, holds to PTs hand to upright self.) Supine to sit: Min guard, HOB elevated     General bed mobility comments: extra time, assistance to come to upright sitting    Transfers Overall transfer level: Needs assistance Equipment used: Rolling walker (2 wheels) Transfers: Sit to/from Stand Sit to Stand: Min guard           General transfer comment: Cuing re: safe hand placement to push to standing vs pulling to standing with BUE on RW.    Ambulation/Gait Ambulation/Gait assistance: Min guard Gait Distance (Feet): 18 Feet Assistive device: Rolling walker (2 wheels) Gait Pattern/deviations: Decreased step length - right, Decreased dorsiflexion - right, Decreased dorsiflexion - left, Decreased step length - left, Decreased stride length, Shuffle Gait velocity: significantly decreased     General Gait Details: decreased dorsiflexion & heel strike, decreased step length BLE, decreased stride length, narrow BOS, extra time when turning, pt reports she's fearful of falling during mobility attempts   Stairs             Wheelchair Mobility    Modified Rankin (Stroke Patients Only)  Balance Overall balance assessment: Needs assistance Sitting-balance support: Feet supported, No upper extremity supported Sitting balance-Leahy Scale: Fair Sitting balance - Comments: supervision static sitting EOB   Standing balance  support: During functional activity, Bilateral upper extremity supported, Reliant on assistive device for balance Standing balance-Leahy Scale: Poor                              Cognition Arousal/Alertness: Awake/alert Behavior During Therapy:  (irritated) Overall Cognitive Status: Within Functional Limits for tasks assessed                                 General Comments: Pt follows simple commands but demonstrates impaired emergent/anticipatory awareness, decreased safety awareness, decreased problem solving.        Exercises      General Comments        Pertinent Vitals/Pain Pain Assessment Pain Assessment: Faces Faces Pain Scale: Hurts whole lot Pain Location: lower back Pain Descriptors / Indicators: Guarding, Grimacing Pain Intervention(s): Repositioned, Monitored during session, Limited activity within patient's tolerance    Home Living                          Prior Function            PT Goals (current goals can now be found in the care plan section) Acute Rehab PT Goals Patient Stated Goal: return home with [facility] services PT Goal Formulation: With patient Time For Goal Achievement: 12/09/22 Potential to Achieve Goals: Fair Progress towards PT goals: Progressing toward goals    Frequency    Min 3X/week      PT Plan Discharge plan needs to be updated    Co-evaluation              AM-PAC PT "6 Clicks" Mobility   Outcome Measure  Help needed turning from your back to your side while in a flat bed without using bedrails?: A Little Help needed moving from lying on your back to sitting on the side of a flat bed without using bedrails?: A Lot Help needed moving to and from a bed to a chair (including a wheelchair)?: A Little Help needed standing up from a chair using your arms (e.g., wheelchair or bedside chair)?: A Little Help needed to walk in hospital room?: Total Help needed climbing 3-5 steps with a  railing? : Total 6 Click Score: 13    End of Session   Activity Tolerance: Patient limited by fatigue;Patient limited by pain Patient left: in bed;with call bell/phone within reach;with bed alarm set Nurse Communication: Mobility status PT Visit Diagnosis: Muscle weakness (generalized) (M62.81);Difficulty in walking, not elsewhere classified (R26.2);Other abnormalities of gait and mobility (R26.89);Unsteadiness on feet (R26.81)     Time: 5400-8676 PT Time Calculation (min) (ACUTE ONLY): 41 min  Charges:  $Therapeutic Activity: 38-52 mins                     Lavone Nian, PT, DPT 11/29/22, 3:42 PM   Waunita Schooner 11/29/2022, 3:39 PM

## 2022-11-30 ENCOUNTER — Other Ambulatory Visit (HOSPITAL_COMMUNITY): Payer: Self-pay

## 2022-11-30 DIAGNOSIS — I10 Essential (primary) hypertension: Secondary | ICD-10-CM | POA: Diagnosis not present

## 2022-11-30 DIAGNOSIS — I480 Paroxysmal atrial fibrillation: Secondary | ICD-10-CM | POA: Diagnosis not present

## 2022-11-30 DIAGNOSIS — I5032 Chronic diastolic (congestive) heart failure: Secondary | ICD-10-CM | POA: Diagnosis not present

## 2022-11-30 DIAGNOSIS — K449 Diaphragmatic hernia without obstruction or gangrene: Secondary | ICD-10-CM | POA: Diagnosis not present

## 2022-11-30 LAB — BASIC METABOLIC PANEL
Anion gap: 6 (ref 5–15)
BUN: 17 mg/dL (ref 8–23)
CO2: 27 mmol/L (ref 22–32)
Calcium: 8.8 mg/dL — ABNORMAL LOW (ref 8.9–10.3)
Chloride: 99 mmol/L (ref 98–111)
Creatinine, Ser: 0.84 mg/dL (ref 0.44–1.00)
GFR, Estimated: >60 mL/min (ref >60)
Glucose, Bld: 114 mg/dL — ABNORMAL HIGH (ref 70–99)
Potassium: 4.1 mmol/L (ref 3.5–5.1)
Sodium: 132 mmol/L — ABNORMAL LOW (ref 135–145)

## 2022-11-30 MED ORDER — DICLOFENAC SODIUM 1 % EX GEL
2.0000 g | Freq: Three times a day (TID) | CUTANEOUS | Status: DC | PRN
  Administered 2022-11-30 – 2022-12-02 (×2): 2 g via TOPICAL
  Filled 2022-11-30: qty 100

## 2022-11-30 MED ORDER — SENNOSIDES-DOCUSATE SODIUM 8.6-50 MG PO TABS
1.0000 | ORAL_TABLET | Freq: Two times a day (BID) | ORAL | 0 refills | Status: DC
  Filled 2022-11-30: qty 60, 30d supply, fill #0

## 2022-11-30 MED ORDER — METOPROLOL TARTRATE 25 MG PO TABS
25.0000 mg | ORAL_TABLET | Freq: Two times a day (BID) | ORAL | 0 refills | Status: DC
  Filled 2022-11-30: qty 60, 30d supply, fill #0

## 2022-11-30 MED ORDER — AMIODARONE HCL 200 MG PO TABS
200.0000 mg | ORAL_TABLET | Freq: Two times a day (BID) | ORAL | 0 refills | Status: DC
  Filled 2022-11-30: qty 44, 30d supply, fill #0

## 2022-11-30 NOTE — TOC Transition Note (Signed)
Discharge medications (3) are being stored in the main pharmacy on the ground floor until patient is ready for discharge.   

## 2022-11-30 NOTE — Progress Notes (Signed)
[place] will sign off.  Rates are controlled in atrial fibrillation.  Medication Recommendations:  Continue amiodarone load as ordered Other recommendations (labs, testing, etc):  Consider DCCV after amiodarone is fully loaded. Follow up as an outpatient:  scheduled for 12/12/22  Rithika Seel C. Oval Linsey, MD, [facility] 11/30/2022 10:22 AM

## 2022-11-30 NOTE — Progress Notes (Signed)
Patient complained of chest pain radiating down left arm and up her neck.  EKG completed, patient questioned as to why did EKG if her pain was in her left shoulder area, RN advised at was because she said chest pain, patient advised that she meant to say that it was her left shoulder.  RN administered requested pain meds at time available to be given.

## 2022-11-30 NOTE — Progress Notes (Signed)
Mobility Specialist Progress Note    11/30/22 1116  Mobility  Activity Ambulated with assistance in hallway  Level of Assistance Minimal assist, patient does 75% or more  Assistive Device Front wheel walker  Distance Ambulated (ft) 70 ft  Activity Response Tolerated well  Mobility Referral Yes  $Mobility charge 1 Mobility   Pre-Mobility: 84 HR, 101/78 (87) BP, 98% SpO2 During Mobility: 120 HR Post-Mobility: 102 HR  Pt received in bed and agreeable. C/o stomach ache and being lightheaded. Returned to chair with call bell in reach and RN present.    Hildred Alamin Mobility Specialist  Please Psychologist, sport and exercise or Rehab Office at [phone]

## 2022-11-30 NOTE — Progress Notes (Addendum)
PROGRESS NOTE    [name]  JSE:[id number] DOB: May 21, 1936 DOA: 11/22/2022 PCP: Corliss Blacker, MD  86/F w/l history of P Afib, hypertension, chronic diastolic CHF, thoracic aortic aneurysm, pulmonary hypertension, hiatal hernia and chronic back pain who presented with abdominal pain. Patient had 2 weeks of abdominal pain, upper region, epigastric, associated with nausea but not vomiting, w/ constipation. ED visit 5 days ago, diagnosed with UTI and constipation and discharged home. -Returned to ED w/ persistent symptoms, cr 0,75 ,BNP 303, Wbc 8,1 hgb 12.8 , UA 21-50 wbc. CXR w/ no infiltrates or effusion, mild cardiomegaly, significant scoliosis and lateral film with significant kyphosis.  -Patient was placed on IV furosemide for volume overload -1/12 developed atrial fibrillation with RVR and required diltiazem drip.  -1/13 converted to sinus rhythm, her abdominal pain improving with PPI and anti reflux measures.  remained weak and deconditioned.  -1/16 discharge cancelled due to atrial fibrillation with RVR. Metop added -1/17: Feels tired, A-fib RVR persists, cardiology consulted, started on p.o. amiodarone  Subjective: -c/o back pain, adamantly refuses discharge until Monday morning, refuses SNF  Assessment and Plan:  Hiatal hernia Abd pain -large hiatal hernia on imaging w/ with significant reflux, triggering abdominal pain and nausea.  Along with constipation -2 CT scans last week 1/6 and 1/11, notable for large hiatal hernia, constipation, significant scoliosis and degenerative spine disease -placed on pantoprazole and sucralfate, along with anti reflux measures (small bites and seating when eating), some improvement noted -Multiple BMs after laxatives, change MiraLAX to PRN -she is considerably frail and debilitated she declines rehab, plan for DC home w/ [facility] services , she is medically stable for DC when [facility] services set up  PAF (paroxysmal atrial fibrillation)  (HCC) -required IV diltiazem and IV metoprolol to control rapid ventricular response atrial fibrillation, then converted to sinus rhythm, back on her regular dose of diltiazem,  -1/16 back w/ AFib RVR- continue diltiazem, Dr. Cathlean Sauer added metoprolol -continue apixaban -Recurrent A-fib RVR A-fib RVR-appreciate cardiology input, this would be challenging considering significant valvular heart disease, started on oral amiodarone , HR improving, Cards recommends FU  Chronic diastolic CHF (congestive heart failure) (HCC) Severe MR and TR 05/2022 echo with EF 60 to 65%, severe asymmetric ventricular hypertrophy of the basal septal segment. RV normal. Severe dilatation of LA and RA.  Severe mitral valve regurgitation. Severe TR regurgitation.  -Diuresed with IV Lasix, she is 5.1 L negative -Now on low-dose oral Lasix, GDMT limited by soft BPs -Not a candidate for surgical intervention for her valvular heart disease  History of DVT of lower extremity Continue anticoagulation with apixaban.,   Hypokalemia Hyponatremia,  -monitor w/ diuretics  DVT prophylaxis: apixaban Code Status: DNR Family Communication: None present Disposition Plan: Home with home health today  Consultants: Cards   Procedures:   Antimicrobials:    Objective: Vitals:   11/29/22 0447 11/29/22 0842 11/29/22 2001 11/30/22 0451  BP:  105/74 91/71 103/79  Pulse:  68 (!) 50 79  Resp:  '14 18 17  '$ Temp:   97.9 F (36.6 C) 98.2 F (36.8 C)  TempSrc:   Oral Oral  SpO2:  98% 97% 99%  Weight: 47.7 kg   46.3 kg  Height:        Intake/Output Summary (Last 24 hours) at 11/30/2022 1233 Last data filed at 11/30/2022 0503 Gross per 24 hour  Intake --  Output 900 ml  Net -900 ml   Filed Weights   11/28/22 0410 11/29/22 0447 11/30/22  0451  Weight: 45.6 kg 47.7 kg 46.3 kg    Examination: Frail elderly female, with significant kyphoscoliosis, AAOx3, no distress CVS: S1-S2, irregularly irregular rhythm Lungs:  CTAB Abd: soft, NT, BS present Extremities: No edema    Data Reviewed:   CBC: Recent Labs  Lab 11/29/22 0055  WBC 10.0  HGB 14.3  HCT 39.8  MCV 85.6  PLT 390   Basic Metabolic Panel: Recent Labs  Lab 11/24/22 0036 11/25/22 0109 11/28/22 0101 11/29/22 0055 11/30/22 0126  NA 132* 132* 134* 133* 132*  K 4.8 4.1 4.3 4.1 4.1  CL 98 99 102 99 99  CO2 '27 25 26 28 27  '$ GLUCOSE 108* 102* 100* 120* 114*  BUN 26* '20 20 14 17  '$ CREATININE 0.77 0.77 0.89 0.81 0.84  CALCIUM 8.7* 8.6* 8.5* 9.0 8.8*  MG  --   --  2.1  --   --    GFR: Estimated Creatinine Clearance: 34 mL/min (by C-G formula based on SCr of 0.84 mg/dL). Liver Function Tests: Recent Labs  Lab 11/29/22 0055  AST 26  ALT 24  ALKPHOS 60  BILITOT 0.7  PROT 5.3*  ALBUMIN 2.8*   No results for input(s): "LIPASE", "AMYLASE" in the last 168 hours.  No results for input(s): "AMMONIA" in the last 168 hours. Coagulation Profile: No results for input(s): "INR", "PROTIME" in the last 168 hours. Cardiac Enzymes: No results for input(s): "CKTOTAL", "CKMB", "CKMBINDEX", "TROPONINI" in the last 168 hours. BNP (last 3 results) No results for input(s): "PROBNP" in the last 8760 hours. HbA1C: No results for input(s): "HGBA1C" in the last 72 hours. CBG: No results for input(s): "GLUCAP" in the last 168 hours. Lipid Profile: No results for input(s): "CHOL", "HDL", "LDLCALC", "TRIG", "CHOLHDL", "LDLDIRECT" in the last 72 hours. Thyroid Function Tests: Recent Labs    11/29/22 0055  TSH 2.071   Anemia Panel: No results for input(s): "VITAMINB12", "FOLATE", "FERRITIN", "TIBC", "IRON", "RETICCTPCT" in the last 72 hours. Urine analysis:    Component Value Date/Time   COLORURINE YELLOW 11/22/2022 [place] 11/22/2022 1247   LABSPEC 1.014 11/22/2022 1247   PHURINE 6.0 11/22/2022 1247   GLUCOSEU NEGATIVE 11/22/2022 1247   HGBUR NEGATIVE 11/22/2022 1247   BILIRUBINUR NEGATIVE 11/22/2022 1247   KETONESUR  NEGATIVE 11/22/2022 1247   PROTEINUR NEGATIVE 11/22/2022 1247   UROBILINOGEN 0.2 09/04/2015 1404   NITRITE NEGATIVE 11/22/2022 1247   LEUKOCYTESUR LARGE (A) 11/22/2022 1247   Sepsis Labs: '@LABRCNTIP'$ (procalcitonin:4,lacticidven:4)  ) Recent Results (from the past 240 hour(s))  Urine Culture     Status: Abnormal   Collection Time: 11/27/22  9:42 PM   Specimen: Urine, Clean Catch  Result Value Ref Range Status   Specimen Description URINE, CLEAN CATCH  Final   Special Requests   Final    NONE Performed at [facility], [place] [address], [place], [place] [address]    Culture MULTIPLE SPECIES PRESENT, SUGGEST RECOLLECTION (A)  Final   Report Status 11/29/2022 FINAL  Final     Radiology Studies: No results found.   Scheduled Meds:  amiodarone  200 mg Oral BID   [START ON 12/13/2022] amiodarone  200 mg Oral Daily   apixaban  2.5 mg Oral BID   diltiazem  180 mg Oral Daily   feeding supplement  237 mL Oral BID BM   levothyroxine  125 mcg Oral Q0600   metoprolol tartrate  25 mg Oral BID   multivitamin with minerals  1 tablet Oral Daily  pantoprazole  40 mg Oral BID   pravastatin  20 mg Oral Q supper   senna-docusate  1 tablet Oral BID   sucralfate  1 g Oral TID WC & HS   Continuous Infusions:   LOS: 8 days    Time spent: 57mn    PDomenic Polite MD [facility]   11/30/2022, 12:33 PM

## 2022-11-30 NOTE — TOC Progression Note (Signed)
Transition of Care [facility]) - Progression Note    Patient Details  Name: [name] MRN: [id number] Date of Birth: 1936-06-27  [facility]) CM/SW Contact  Tom-Johnson, Renea Ee, RN Phone Number: 11/30/2022, 2:08 PM  Clinical Narrative:     CM informed by MD to talk with patient about getting more help at home. CM spoke with patient at bedside about getting extra help at home as patient already has personal care service that comes in 5 days a week from 9am-1pm Mon-Fri and is also set up with [facility] for [place] disciplines.  Patient told CM that she is not ready to be discharge till Monday as she is not yet where she was before she came to the hospital. CM asked patient if she could assist her with contacting her PCP provider to verify if they could add additional hours to the one she already has. Patient declined, stating she is the one who will contact them and will not give out any information.  CM notified RN. RN came and spoke with patient, patient then told RN to give her her purse and she will make the call herself. Patient removed the Important Message form by Medicare given to her and called to appeal her discharge as she is not ready. CM informed patient that a discharge order has not been placed yet. MD and [facility] leadership notified.  Patient has Home health setup with [facility] and active [place]. CM will continue to follow.      Barriers to Discharge: No Barriers Identified  Expected Discharge Plan and Services                         DME Arranged: N/A         HH Arranged: PT, OT HH Agency: [place] ([facility]) Date [place]: 11/26/22 Time [place]: 1351 Representative spoke with at [place]: [place] Determinants of Health ([place]) Interventions SDOH Screenings   Food Insecurity: No Food Insecurity (11/22/2022)  Housing: Low Risk  (11/22/2022)  Transportation Needs: No Transportation  Needs (11/22/2022)  Utilities: Not At Risk (11/22/2022)  Tobacco Use: Low Risk  (11/28/2022)    Readmission Risk Interventions     No data to display

## 2022-12-01 NOTE — Plan of Care (Signed)

## 2022-12-01 NOTE — Discharge Instructions (Signed)

## 2022-12-01 NOTE — Progress Notes (Signed)
Patient seen and examined, no changes from my note yesterday, frail chronically ill but overall stable -Adamantly declined discharge until Monday, called Medicare to appeal her discharge yesterday, adamantly refuses rehab -Continue current meds, I have asked patient multiple times today to notify her caregiver to be here on Monday morning  Domenic Polite, MD

## 2022-12-01 NOTE — Progress Notes (Signed)
   12/01/22 1200  Mobility  Activity Ambulated with assistance in hallway  Level of Assistance Minimal assist, patient does 75% or more  Assistive Device Front wheel walker  Distance Ambulated (ft) 60 ft  Activity Response Tolerated well  Mobility Referral Yes  $Mobility charge 1 Mobility   Mobility Specialist Progress Note  Pre-Mobility: 108 HR, 97/86 BP During Mobility: 110-135 HR Post-Mobility: 117 HR  Pt was in bed and agreeable. Had c/o back pain throughout ambulation. Returned to chair w/ all needs met and call bell in reach.   Lucious Groves Mobility Specialist  Please contact via SecureChat or Rehab office at [phone]

## 2022-12-02 DIAGNOSIS — I5032 Chronic diastolic (congestive) heart failure: Secondary | ICD-10-CM | POA: Diagnosis not present

## 2022-12-02 DIAGNOSIS — I10 Essential (primary) hypertension: Secondary | ICD-10-CM | POA: Diagnosis not present

## 2022-12-02 DIAGNOSIS — K449 Diaphragmatic hernia without obstruction or gangrene: Secondary | ICD-10-CM | POA: Diagnosis not present

## 2022-12-02 DIAGNOSIS — I480 Paroxysmal atrial fibrillation: Secondary | ICD-10-CM | POA: Diagnosis not present

## 2022-12-02 MED ORDER — SUCRALFATE 1 GM/10ML PO SUSP
1.0000 g | Freq: Two times a day (BID) | ORAL | Status: DC
  Administered 2022-12-02 – 2022-12-03 (×2): 1 g via ORAL
  Filled 2022-12-02 (×2): qty 10

## 2022-12-02 NOTE — Progress Notes (Signed)
PROGRESS NOTE    [name]  IHK:[id number] DOB: September 22, 1936 DOA: 11/22/2022 PCP: Corliss Blacker, MD  86/F w/l history of P Afib, hypertension, chronic diastolic CHF, thoracic aortic aneurysm, pulmonary hypertension, hiatal hernia and chronic back pain who presented with abdominal pain. Patient had 2 weeks of abdominal pain, upper region, epigastric, associated with nausea but not vomiting, w/ constipation. ED visit 5 days ago, diagnosed with UTI and constipation and discharged home. -Returned to ED w/ persistent symptoms, cr 0,75 ,BNP 303, Wbc 8,1 hgb 12.8 , UA 21-50 wbc. CXR w/ no infiltrates or effusion, mild cardiomegaly, significant scoliosis and lateral film with significant kyphosis.  -Patient was placed on IV furosemide for volume overload -1/12 developed atrial fibrillation with RVR and required diltiazem drip.  -1/13 converted to sinus rhythm, her abdominal pain improving with PPI and anti reflux measures.  remained weak and deconditioned.  -1/16 discharge cancelled due to atrial fibrillation with RVR. Metop added -1/17: Feels tired, A-fib RVR persists, cardiology consulted, started on p.o. amiodarone 1/19: Declined discharge, declined rehab, appealed Medicare  Subjective: -now reports that she is making arrangements w/ [facility] agency for DC in next 2days   Assessment and Plan:  Hiatal hernia Abd pain -large hiatal hernia on imaging w/ with significant reflux, triggering abdominal pain and nausea.  Along with constipation -2 CT scans last week 1/6 and 1/11, notable for large hiatal hernia, constipation, significant scoliosis and degenerative spine disease -placed on pantoprazole and sucralfate, along with anti reflux measures (small bites and seating when eating), some improvement noted -Multiple BMs after laxatives, change MiraLAX to PRN -she is considerably frail and debilitated she declines rehab, plan for DC home w/ [facility] services , she is medically stable for DC, adamantly  refused to go home till Monday and called medicare to appeal her DC, I have told her in no uncertain terms that if she is stable, I will place a DC order tomorrow am and offered to call her aide, she refuses to give me the number  PAF (paroxysmal atrial fibrillation) ([place]) -required IV diltiazem and IV metoprolol to control rapid ventricular response atrial fibrillation, then converted to sinus rhythm, back on her regular dose of diltiazem,  -1/16 back w/ AFib RVR- continue diltiazem, Dr. Cathlean Sauer added metoprolol -continue apixaban -Recurrent A-fib RVR A-fib RVR-appreciate cardiology input, this would be challenging considering significant valvular heart disease, started on oral amiodarone , HR improving, Cards recommends FU  Chronic diastolic CHF (congestive heart failure) ([place]) Severe MR and TR 05/2022 echo with EF 60 to 65%, severe asymmetric ventricular hypertrophy of the basal septal segment. RV normal. Severe dilatation of LA and RA.  Severe mitral valve regurgitation. Severe TR regurgitation.  -Diuresed with IV Lasix, she is 5.1 L negative -Now on low-dose oral Lasix, GDMT limited by soft BPs -Not a candidate for surgical intervention for her valvular heart disease  History of DVT of lower extremity Continue anticoagulation with apixaban.,   Hypokalemia Hyponatremia,  -monitor w/ diuretics  DVT prophylaxis: apixaban Code Status: DNR Family Communication: None present Disposition Plan: Home with home health tomorrow  Consultants: Cards   Procedures:   Antimicrobials:    Objective: Vitals:   12/01/22 0900 12/01/22 1107 12/01/22 1947 12/02/22 0514  BP: 102/79 102/79 116/82 119/77  Pulse:   62 (!) 51  Resp: '20 14 19 17  '$ Temp:  (!) 97.4 F (36.3 C) (!) 97.5 F (36.4 C) (!) 97.4 F (36.3 C)  TempSrc:  Oral Oral Oral  SpO2:  98% 98% 99%  Weight:    47.9 kg  Height:        Intake/Output Summary (Last 24 hours) at 12/02/2022 1101 Last data filed at 12/01/2022  1700 Gross per 24 hour  Intake 360 ml  Output 201 ml  Net 159 ml   Filed Weights   11/30/22 0451 12/01/22 0551 12/02/22 0514  Weight: 46.3 kg 46.9 kg 47.9 kg    Examination: Frail elderly female, with significant kyphoscoliosis, AAOx3, no distress CVS: S1-S2, irregularly irregular rhythm Lungs: CTAB Abd: soft, NT, BS present Extremities: No edema    Data Reviewed:   CBC: Recent Labs  Lab 11/29/22 0055  WBC 10.0  HGB 14.3  HCT 39.8  MCV 85.6  PLT 765   Basic Metabolic Panel: Recent Labs  Lab 11/28/22 0101 11/29/22 0055 11/30/22 0126  NA 134* 133* 132*  K 4.3 4.1 4.1  CL 102 99 99  CO2 '26 28 27  '$ GLUCOSE 100* 120* 114*  BUN '20 14 17  '$ CREATININE 0.89 0.81 0.84  CALCIUM 8.5* 9.0 8.8*  MG 2.1  --   --    GFR: Estimated Creatinine Clearance: 34 mL/min (by C-G formula based on SCr of 0.84 mg/dL). Liver Function Tests: Recent Labs  Lab 11/29/22 0055  AST 26  ALT 24  ALKPHOS 60  BILITOT 0.7  PROT 5.3*  ALBUMIN 2.8*   No results for input(s): "LIPASE", "AMYLASE" in the last 168 hours.  No results for input(s): "AMMONIA" in the last 168 hours. Coagulation Profile: No results for input(s): "INR", "PROTIME" in the last 168 hours. Cardiac Enzymes: No results for input(s): "CKTOTAL", "CKMB", "CKMBINDEX", "TROPONINI" in the last 168 hours. BNP (last 3 results) No results for input(s): "PROBNP" in the last 8760 hours. HbA1C: No results for input(s): "HGBA1C" in the last 72 hours. CBG: No results for input(s): "GLUCAP" in the last 168 hours. Lipid Profile: No results for input(s): "CHOL", "HDL", "LDLCALC", "TRIG", "CHOLHDL", "LDLDIRECT" in the last 72 hours. Thyroid Function Tests: No results for input(s): "TSH", "T4TOTAL", "FREET4", "T3FREE", "THYROIDAB" in the last 72 hours.  Anemia Panel: No results for input(s): "VITAMINB12", "FOLATE", "FERRITIN", "TIBC", "IRON", "RETICCTPCT" in the last 72 hours. Urine analysis:    Component Value Date/Time    COLORURINE YELLOW 11/22/2022 [place] 11/22/2022 1247   LABSPEC 1.014 11/22/2022 1247   PHURINE 6.0 11/22/2022 1247   GLUCOSEU NEGATIVE 11/22/2022 1247   HGBUR NEGATIVE 11/22/2022 1247   BILIRUBINUR NEGATIVE 11/22/2022 1247   KETONESUR NEGATIVE 11/22/2022 1247   PROTEINUR NEGATIVE 11/22/2022 1247   UROBILINOGEN 0.2 09/04/2015 1404   NITRITE NEGATIVE 11/22/2022 1247   LEUKOCYTESUR LARGE (A) 11/22/2022 1247   Sepsis Labs: '@LABRCNTIP'$ (procalcitonin:4,lacticidven:4)  ) Recent Results (from the past 240 hour(s))  Urine Culture     Status: Abnormal   Collection Time: 11/27/22  9:42 PM   Specimen: Urine, Clean Catch  Result Value Ref Range Status   Specimen Description URINE, CLEAN CATCH  Final   Special Requests   Final    NONE Performed at [facility], [place] [address] [place], [place] [address]    Culture MULTIPLE SPECIES PRESENT, SUGGEST RECOLLECTION (A)  Final   Report Status 11/29/2022 FINAL  Final     Radiology Studies: No results found.   Scheduled Meds:  amiodarone  200 mg Oral BID   [START ON 12/13/2022] amiodarone  200 mg Oral Daily   apixaban  2.5 mg Oral BID   diltiazem  180 mg Oral Daily  feeding supplement  237 mL Oral BID BM   levothyroxine  125 mcg Oral Q0600   metoprolol tartrate  25 mg Oral BID   multivitamin with minerals  1 tablet Oral Daily   pantoprazole  40 mg Oral BID   pravastatin  20 mg Oral Q supper   senna-docusate  1 tablet Oral BID   sucralfate  1 g Oral TID WC & HS   Continuous Infusions:   LOS: 10 days    Time spent: 55mn    PDomenic Polite MD [facility]   12/02/2022, 11:01 AM

## 2022-12-03 ENCOUNTER — Other Ambulatory Visit (HOSPITAL_COMMUNITY): Payer: Self-pay

## 2022-12-03 DIAGNOSIS — K449 Diaphragmatic hernia without obstruction or gangrene: Secondary | ICD-10-CM | POA: Diagnosis not present

## 2022-12-03 LAB — BASIC METABOLIC PANEL
Anion gap: 5 (ref 5–15)
BUN: 19 mg/dL (ref 8–23)
CO2: 28 mmol/L (ref 22–32)
Calcium: 8.6 mg/dL — ABNORMAL LOW (ref 8.9–10.3)
Chloride: 99 mmol/L (ref 98–111)
Creatinine, Ser: 0.88 mg/dL (ref 0.44–1.00)
GFR, Estimated: >60 mL/min (ref >60)
Glucose, Bld: 92 mg/dL (ref 70–99)
Potassium: 5 mmol/L (ref 3.5–5.1)
Sodium: 132 mmol/L — ABNORMAL LOW (ref 135–145)

## 2022-12-03 MED ORDER — BISACODYL 5 MG PO TBEC
5.0000 mg | DELAYED_RELEASE_TABLET | Freq: Every day | ORAL | 1 refills | Status: DC | PRN
  Filled 2022-12-03: qty 30, 30d supply, fill #0

## 2022-12-03 MED ORDER — DICLOFENAC SODIUM 1 % EX GEL
2.0000 g | Freq: Three times a day (TID) | CUTANEOUS | 0 refills | Status: DC | PRN
  Filled 2022-12-03: qty 100, fill #0

## 2022-12-03 MED ORDER — SENNOSIDES-DOCUSATE SODIUM 8.6-50 MG PO TABS
1.0000 | ORAL_TABLET | Freq: Two times a day (BID) | ORAL | 1 refills | Status: DC
  Filled 2022-12-03: qty 60, 30d supply, fill #0

## 2022-12-03 MED ORDER — PANTOPRAZOLE SODIUM 40 MG PO TBEC
DELAYED_RELEASE_TABLET | ORAL | 1 refills | Status: DC
  Filled 2022-12-03: qty 30, fill #0

## 2022-12-03 MED ORDER — APIXABAN 2.5 MG PO TABS
2.5000 mg | ORAL_TABLET | Freq: Two times a day (BID) | ORAL | 1 refills | Status: DC
  Filled 2022-12-03: qty 60, 30d supply, fill #0

## 2022-12-03 MED ORDER — PRAVASTATIN SODIUM 20 MG PO TABS
20.0000 mg | ORAL_TABLET | Freq: Every day | ORAL | 1 refills | Status: AC
  Filled 2022-12-03: qty 30, 30d supply, fill #0

## 2022-12-03 MED ORDER — METOPROLOL TARTRATE 25 MG PO TABS
25.0000 mg | ORAL_TABLET | Freq: Two times a day (BID) | ORAL | 1 refills | Status: DC
  Filled 2022-12-03: qty 60, 30d supply, fill #0

## 2022-12-03 MED ORDER — POLYSACCHARIDE IRON COMPLEX 150 MG PO CAPS
150.0000 mg | ORAL_CAPSULE | ORAL | 0 refills | Status: AC
  Filled 2022-12-03: qty 30, 70d supply, fill #0

## 2022-12-03 MED ORDER — SOLIFENACIN SUCCINATE 10 MG PO TABS
10.0000 mg | ORAL_TABLET | Freq: Every day | ORAL | 1 refills | Status: AC
  Filled 2022-12-03: qty 30, 30d supply, fill #0

## 2022-12-03 MED ORDER — FUROSEMIDE 20 MG PO TABS: 20.0000 mg | ORAL_TABLET | Freq: Every day | ORAL | 1 refills | Status: DC

## 2022-12-03 MED ORDER — SUCRALFATE 1 G PO TABS
1.0000 g | ORAL_TABLET | Freq: Three times a day (TID) | ORAL | 0 refills | Status: DC
  Filled 2022-12-03: qty 120, 30d supply, fill #0

## 2022-12-03 MED ORDER — AMIODARONE HCL 200 MG PO TABS
200.0000 mg | ORAL_TABLET | Freq: Two times a day (BID) | ORAL | 0 refills | Status: DC
  Filled 2022-12-03: qty 60, 46d supply, fill #0

## 2022-12-03 MED ORDER — DILTIAZEM HCL ER COATED BEADS 180 MG PO CP24
ORAL_CAPSULE | ORAL | 1 refills | Status: DC
  Filled 2022-12-03: qty 30, 30d supply, fill #0

## 2022-12-03 MED ORDER — LEVOTHYROXINE SODIUM 125 MCG PO TABS
125.0000 ug | ORAL_TABLET | Freq: Every day | ORAL | 1 refills | Status: DC
  Filled 2022-12-03: qty 30, 30d supply, fill #0

## 2022-12-03 MED ORDER — HYDROCODONE-ACETAMINOPHEN 5-325 MG PO TABS
1.0000 | ORAL_TABLET | Freq: Four times a day (QID) | ORAL | 0 refills | Status: DC | PRN
  Filled 2022-12-03: qty 20, 3d supply, fill #0

## 2022-12-03 MED ORDER — POTASSIUM CHLORIDE CRYS ER 10 MEQ PO TBCR
10.0000 meq | EXTENDED_RELEASE_TABLET | Freq: Every day | ORAL | 1 refills | Status: DC
  Filled 2022-12-03: qty 30, 30d supply, fill #0

## 2022-12-03 NOTE — TOC Transition Note (Signed)
Discharge medications (3) are being stored in the Transitions of Care (TOC) Pharmacy on the second floor until patient is ready for discharge.    

## 2022-12-03 NOTE — Discharge Summary (Signed)
Physician Discharge Summary  [name] YBO:[id number] DOB: [birth year] DOA: 11/22/2022  PCP: Corliss Blacker, MD  Admit date: 11/22/2022 Discharge date: 12/03/2022  Time spent: 35 minutes  Recommendations for Outpatient Follow-up:  Cardiology Dr. Johney Frame 1/29 Home health PT, RN, aide PCP in 1 to 2 weeks   Discharge Diagnoses:  Principal Problem:   Hiatal hernia A-fib with RVR Chronic neck pain Chronic back pain Deconditioning Frailty  PAF  Acute on chronic diastolic CHF (congestive heart failure) (HCC)   Essential hypertension   History of DVT of lower extremity   Hypokalemia   Severe mitral regurgitation   Nonrheumatic tricuspid valve regurgitation   Discharge Condition: Stable  Diet recommendation: Low-sodium, heart healthy  Filed Weights   12/01/22 0551 12/02/22 0514 12/03/22 0500  Weight: 46.9 kg 47.9 kg 47.1 kg    History of present illness:  87/F w/l history of P Afib, hypertension, chronic diastolic CHF, thoracic aortic aneurysm, pulmonary hypertension, hiatal hernia and chronic back pain who presented with abdominal pain. Patient had 2 weeks of abdominal pain, upper region, epigastric, associated with nausea but not vomiting, w/ constipation. ED visit 5 days ago, diagnosed with UTI and constipation and discharged home. -Returned to ED w/ persistent symptoms, cr 0,75 ,BNP 303, Wbc 8,1 hgb 12.8 , UA 21-50 wbc. CXR w/ no infiltrates or effusion, mild cardiomegaly, significant scoliosis and lateral film with significant kyphosis.   Hospital Course:   Hiatal hernia Abd pain -large hiatal hernia on imaging w/ with significant reflux, triggering abdominal pain and nausea.  Along with constipation -2 CT scans last week 1/6 and 1/11, notable for large hiatal hernia, constipation, significant scoliosis and degenerative spine disease -placed on pantoprazole and sucralfate, along with anti reflux measures (small bites and seating when eating), some improvement  noted -Multiple BMs after laxatives, now on Senokot twice daily and Dulcolax PRN  Frailty -she is considerably frail and debilitated, but adamantly declines rehab, plan for DC home w/ [facility] services, has been medically stable for discharge for past 4 days however she refused to go home till Monday and called medicare to appeal her DC, she has home health services and an aide 4 hours a day, advised to increase the hours with her home health aide for next few weeks  PAF (paroxysmal atrial fibrillation) (HCC) -required IV diltiazem and IV metoprolol to control rapid ventricular response atrial fibrillation, then converted to sinus rhythm, back on her regular dose of diltiazem,  -1/16 back w/ AFib RVR- continue diltiazem, Dr. Cathlean Sauer added metoprolol -Recurrent A-fib RVR A-fib RVR on 1/17, seen by cardiology, started on oral amiodarone, could be challenging considering significant valvular heart disease, -heart rate improved, now converted to sinus rhythm, cards signed off last week, recommended follow-up with Dr. Johney Frame   Chronic diastolic CHF (congestive heart failure) ([place]) Severe MR and TR 05/2022 echo with EF 60 to 65%, severe asymmetric ventricular hypertrophy of the basal septal segment. RV normal. Severe dilatation of LA and RA.  Severe mitral valve regurgitation. Severe TR regurgitation.  -Diuresed with IV Lasix, she is 5.1 L negative -Now on low-dose oral Lasix, GDMT limited by soft BPs -Not a candidate for surgical intervention for her valvular heart disease   History of DVT of lower extremity Continue apixaban  Chronic neck and back pain  -on hydrocodone at baseline, Voltaren gel added   Hypokalemia -replaced    Discharge Exam: Vitals:   12/02/22 2051 12/03/22 0632  BP: 116/78 128/88  Pulse: 63 66  Resp:  15 (!) 21  Temp: 97.8 F (36.6 C) (!) 97.5 F (36.4 C)  SpO2: 98% 96%   87-year-old female, with significant kyphoscoliosis, AAOx3, no distress CVS: S1-S2,  irregularly irregular rhythm Lungs: CTAB Abd: soft, NT, BS present Extremities: No edema   Discharge Instructions   Discharge Instructions     Diet - low sodium heart healthy   Complete by: As directed    Diet - low sodium heart healthy   Complete by: As directed    Discharge instructions   Complete by: As directed    Please follow up with primary care in 7 to 10 days.   Increase activity slowly   Complete by: As directed    Increase activity slowly   Complete by: As directed       Allergies as of 12/03/2022       Reactions   Contrast Media [iodinated Contrast Media] Shortness Of Breath   Iohexol Shortness Of Breath   Mucinex [guaifenesin Er] Other (See Comments)   DIZZINESS   Zanaflex [tizanidine Hcl] Anxiety, Other (See Comments)   Dizziness   Penicillins Rash   Has patient had a PCN reaction causing immediate rash, facial/tongue/throat swelling, SOB or lightheadedness with hypotension: No Has patient had a PCN reaction causing severe rash involving mucus membranes or skin necrosis: No Has patient had a PCN reaction that required hospitalization No Has patient had a PCN reaction occurring within the last 10 years: No If all of the above answers are "NO", then may proceed with Cephalosporin use.   Robaxin [methocarbamol] Diarrhea        Medication List     STOP taking these medications    losartan 25 MG tablet Commonly known as: COZAAR   omeprazole 20 MG tablet Commonly known as: PRILOSEC OTC   potassium chloride 10 MEQ tablet Commonly known as: KLOR-CON M   STOOL SOFTENER PO       TAKE these medications    acetaminophen 325 MG tablet Commonly known as: TYLENOL Take 2 tablets (650 mg total) by mouth every 6 (six) hours as needed for moderate pain or mild pain.   amiodarone 200 MG tablet Commonly known as: PACERONE Take 1 tablet (200 mg total) by mouth 2 (two) times daily for 2 weeks then take 1 tablet ('200mg'$ ) daily   bisacodyl 5 MG EC  tablet Commonly known as: DULCOLAX Take 1 tablet (5 mg total) by mouth daily as needed for moderate constipation.   BONINE PO Take 12.5 mg by mouth daily as needed (dizziness).   calcium citrate-vitamin D 315-200 MG-UNIT tablet Commonly known as: CITRACAL+D Take 1 tablet by mouth 2 (two) times daily. What changed:  how much to take when to take this   cholecalciferol 1000 units tablet Commonly known as: VITAMIN D Take 2,000 Units by mouth every evening. Vitamin D3   diclofenac Sodium 1 % Gel Commonly known as: VOLTAREN Apply 2 g topically 3 (three) times daily as needed (join pains).   diltiazem 180 MG 24 hr capsule Commonly known as: CARDIZEM CD Take 1 capsule by mouth daily What changed: additional instructions   Eliquis 2.5 MG Tabs tablet Generic drug: apixaban Take 1 tablet (2.5 mg total) by mouth 2 (two) times daily.   estradiol 0.1 MG/GM vaginal cream Commonly known as: ESTRACE Place 1 Applicatorful vaginally once a week.   furosemide 20 MG tablet Commonly known as: LASIX Take 1 tablet (20 mg total) by mouth daily. Take one tablet by mouth three times a  week What changed:  how much to take how to take this when to take this   HYDROcodone-acetaminophen 5-325 MG tablet Commonly known as: NORCO/VICODIN Take 1-2 tablets by mouth every 6 (six) hours as needed (mild pain). What changed: when to take this   iron polysaccharides 150 MG capsule Commonly known as: NIFEREX Take 1 capsule (150 mg total) by mouth 3 (three) times a week. M, W, F Notes to patient: Take 2 hours before Vitamins   levothyroxine 125 MCG tablet Commonly known as: SYNTHROID Take 1 tablet (125 mcg total) by mouth daily before breakfast.   metoprolol tartrate 25 MG tablet Commonly known as: LOPRESSOR Take 1 tablet (25 mg total) by mouth 2 (two) times daily.   multivitamin tablet Take 1 tablet by mouth daily with breakfast.   OVER THE COUNTER MEDICATION Take 1,000 mg by mouth daily.  D-Mannose 500 mg   pantoprazole 40 MG tablet Commonly known as: PROTONIX Take 1 tablet twice daily for 15 days and then continue once daily.   pravastatin 20 MG tablet Commonly known as: PRAVACHOL Take 1 tablet (20 mg total) by mouth daily with supper.   risedronate 150 MG tablet Commonly known as: ACTONEL Take 150 mg by mouth every 30 (thirty) days. with water on empty stomach, nothing by mouth or lie down for next 30 minutes.   senna-docusate 8.6-50 MG tablet Commonly known as: Senokot-S Take 1 tablet by mouth 2 (two) times daily.   solifenacin 10 MG tablet Commonly known as: VESICARE Take 1 tablet (10 mg total) by mouth daily.   sucralfate 1 g tablet Commonly known as: CARAFATE Take 1 tablet (1 g total) by mouth 4 (four) times daily -  with meals and at bedtime. Tablets may be dissolved in water before administration.   VITA-C PO Take 1,000 mg by mouth daily.               Durable Medical Equipment  (From admission, onward)           Start     Ordered   12/03/22 0840  For home use only DME Other see comment  Once       Comments: Transport chair and cushion  Question:  Length of Need  Answer:  Lifetime   12/03/22 0843   12/03/22 0812  DME Shower stool  Once        12/03/22 0813           Allergies  Allergen Reactions   Contrast Media [Iodinated Contrast Media] Shortness Of Breath   Iohexol Shortness Of Breath   Mucinex [Guaifenesin Er] Other (See Comments)    DIZZINESS   Zanaflex [Tizanidine Hcl] Anxiety and Other (See Comments)    Dizziness   Penicillins Rash    Has patient had a PCN reaction causing immediate rash, facial/tongue/throat swelling, SOB or lightheadedness with hypotension: No Has patient had a PCN reaction causing severe rash involving mucus membranes or skin necrosis: No Has patient had a PCN reaction that required hospitalization No Has patient had a PCN reaction occurring within the last 10 years: No If all of the above answers  are "NO", then may proceed with Cephalosporin use.    Robaxin [Methocarbamol] Diarrhea    Follow-up [facility].   Specialty: [place] Why: Agency will call you to set up apt times Contact information: [place]  [phone]  Corliss Blacker, MD. Go on 12/10/2022.   Specialty: Internal Medicine Why: '@1'$ :30pm Contact information: [address] [place] [address] [phone]                  The results of significant diagnostics from this hospitalization (including imaging, microbiology, ancillary and laboratory) are listed below for reference.    Significant Diagnostic Studies: VAS [place] LOWER EXTREMITY VENOUS (DVT) (7a-7p)  Result Date: 11/22/2022  Lower Venous DVT Study Patient Name:  ROSE JAMARIAH TONY  Date of Exam:   11/22/2022 Medical Rec #: [id number]           Accession #:    [id number] Date of Birth: Aug 27, 1936           Patient Gender: F Patient Age:   89 years Exam Location:  [facility] Procedure:      VAS [place] LOWER EXTREMITY VENOUS (DVT) Referring Phys: Nicki Reaper [place] --------------------------------------------------------------------------------  Indications: Bilateral lower extremity venous swelling, left>right, skin changes.  Risk Factors: CHF. Comparison Study: 02-06-2022 Prior left lower extremity venous study was                   negative for DVT. Performing Technologist: Darlin Coco RDMS, RVT  Examination Guidelines: A complete evaluation includes B-mode imaging, spectral Doppler, color Doppler, and power Doppler as needed of all accessible portions of each vessel. Bilateral testing is considered an integral part of a complete examination. Limited examinations for reoccurring indications may be performed as noted. The reflux portion of the exam is performed with the patient in reverse Trendelenburg.   +-----+---------------+---------+-----------+----------+--------------+ RIGHTCompressibilityPhasicitySpontaneityPropertiesThrombus Aging +-----+---------------+---------+-----------+----------+--------------+ CFV  Full           Yes      Yes                                 +-----+---------------+---------+-----------+----------+--------------+   +---------+---------------+---------+-----------+----------+--------------+ LEFT     CompressibilityPhasicitySpontaneityPropertiesThrombus Aging +---------+---------------+---------+-----------+----------+--------------+ CFV      Full           Yes      Yes                                 +---------+---------------+---------+-----------+----------+--------------+ SFJ      Full                                                        +---------+---------------+---------+-----------+----------+--------------+ FV Prox  Full                                                        +---------+---------------+---------+-----------+----------+--------------+ FV Mid   Full                                                        +---------+---------------+---------+-----------+----------+--------------+ FV DistalFull                                                        +---------+---------------+---------+-----------+----------+--------------+  PFV      Full                                                        +---------+---------------+---------+-----------+----------+--------------+ POP      Full           Yes      Yes                                 +---------+---------------+---------+-----------+----------+--------------+ PTV      Full                                                        +---------+---------------+---------+-----------+----------+--------------+ PERO     Full                                                         +---------+---------------+---------+-----------+----------+--------------+     Summary: RIGHT: - No evidence of common femoral vein obstruction.  LEFT: - There is no evidence of deep vein thrombosis in the lower extremity.  - No cystic structure found in the popliteal fossa.  *See table(s) above for measurements and observations. Electronically signed by Jamelle Haring on 11/22/2022 at 4:53:09 PM.    Final    CT CHEST ABDOMEN PELVIS WO CONTRAST  Result Date: 11/22/2022 CLINICAL DATA:  Ascending thoracic aneurysm. EXAM: CT CHEST, ABDOMEN AND PELVIS WITHOUT CONTRAST TECHNIQUE: Multidetector CT imaging of the chest, abdomen and pelvis was performed following the standard protocol without IV contrast. RADIATION DOSE REDUCTION: This exam was performed according to the departmental dose-optimization program which includes automated exposure control, adjustment of the mA and/or kV according to patient size and/or use of iterative reconstruction technique. COMPARISON:  CT abdomen/pelvis 11/17/2022 and CT chest from 2018 FINDINGS: CT CHEST FINDINGS Cardiovascular: The heart is within normal limits in size for the patient's age. Stable mitral valve annulus and coronary artery calcifications. Stable tortuosity and calcification of the thoracic aorta. Stable fusiform aneurysmal dilatation of the ascending thoracic aorta with maximum measurement of 4.6 cm. No change since 2018. Mediastinum/Nodes: Stable large hiatal hernia with a good portion of the stomach up in the chest. No mediastinal or hilar mass or adenopathy. Lungs/Pleura: No acute pulmonary findings. No worrisome pulmonary lesions. No pleural effusions or pleural lesions. Musculoskeletal: Severe scoliosis but no acute bony findings. CT ABDOMEN PELVIS FINDINGS Hepatobiliary: No hepatic lesions are identified without contrast. No intrahepatic biliary dilatation. No common bile duct dilatation. Pancreas: Pancreatic atrophy but no mass or evidence of acute inflammation.  Spleen: Normal size. No focal lesions. Adrenals/Urinary Tract: Adrenal glands and kidneys are grossly normal. No renal or obstructing ureteral calculi. The bladder is grossly normal. Stomach/Bowel: Stomach, duodenum, small bowel and colon grossly normal. Vascular/Lymphatic: Stable atherosclerotic calcifications involving the aorta and iliac arteries but no aneurysm. No mesenteric or retroperitoneal mass or adenopathy. Reproductive: Vaginal pessary in place. Small uterus. Other: No pelvic mass or adenopathy. No free pelvic fluid  collections. No inguinal mass or adenopathy. No abdominal wall hernia or subcutaneous lesions. Musculoskeletal: Stable sclerotic lesion involving the right iliac bone, likely benign bone island. No acute bony findings. Severe scoliosis and numerous lower thoracic and lumbar compression fractures with vertebral augmentation changes. IMPRESSION: 1. Stable 4.6 cm ascending thoracic aortic aneurysm. No change since 2018. Ascending thoracic aortic aneurysm. Recommend semi-annual imaging followup by CTA or MRA and referral to cardiothoracic surgery if not already obtained. This recommendation follows 2010 ACCF/AHA/AATS/ACR/ASA/SCA/SCAI/SIR/STS/SVM Guidelines for the Diagnosis and Management of Patients With Thoracic Aortic Disease. Circulation. 2010; 121: E268-T419. Aortic aneurysm NOS (ICD10-I71.9) 2. Stable large hiatal hernia. 3. No acute abdominal/pelvic findings, mass lesions or adenopathy. 4. Atherosclerotic changes involving the abdominal aorta but no aneurysm. 5. Severe scoliosis and associated degenerative spine disease. Multiple lower thoracic and lumbar spine compression fractures with vertebral augmentation changes. Aortic Atherosclerosis (ICD10-I70.0). Electronically Signed   By: Marijo Sanes M.D.   On: 11/22/2022 14:50   DG Chest 2 View  Result Date: 11/22/2022 CLINICAL DATA:  Rib pain.  Worse with inspiration EXAM: CHEST - 2 VIEW COMPARISON:  10/10/2022 and older x-ray series  FINDINGS: Film is rotated to left the patient is tilted. Enlarged cardiopericardial silhouette. Calcified tortuous aorta. No consolidation, pneumothorax or effusion. Fixation hardware along the lower cervical spine at the edge of the imaging field. There is kyphosis centered over the lower thoracic spine with multilevel augmentation cement along compressed vertebral bodies. Please correlate with clinical history. Surgical clips overlie the right hemithorax. There is also a double density overlying the heart with air consistent with a hiatal hernia. IMPRESSION: Rotated and tilted radiograph. Enlarged cardiopericardial silhouette with a hiatal hernia. No consolidation, effusion or pneumothorax. The rib fractures identified on the prior study are less well identified on this current exam Electronically Signed   By: Jill Side M.D.   On: 11/22/2022 11:28   CT ABDOMEN PELVIS WO CONTRAST  Result Date: 11/17/2022 CLINICAL DATA:  Left lower quadrant abdominal pain. Complicated diverticulitis suspected. EXAM: CT ABDOMEN AND PELVIS WITHOUT CONTRAST TECHNIQUE: Multidetector CT imaging of the abdomen and pelvis was performed following the standard protocol without IV contrast. RADIATION DOSE REDUCTION: This exam was performed according to the departmental dose-optimization program which includes automated exposure control, adjustment of the mA and/or kV according to patient size and/or use of iterative reconstruction technique. COMPARISON:  Abdominopelvic CT 05/30/2017. FINDINGS: Lower chest: There is a large hiatal hernia which has a probable paraesophageal component and has enlarged from the prior abdominal CT. There is compressive atelectasis in the adjacent left lower lobe. No confluent airspace opacity, pleural or pericardial effusion. There are calcifications of the aortic valve, mitral annulus, thoracic aorta and coronary arteries. Hepatobiliary: No focal hepatic abnormalities are identified on noncontrast imaging.  No evidence of gallstones, gallbladder wall thickening or biliary dilatation. Pancreas: The pancreas appears atrophied without focal abnormality, ductal dilatation or surrounding inflammation. Spleen: Normal in size without focal abnormality. Adrenals/Urinary Tract: No evidence of adrenal mass. No evidence of urinary tract calculus, suspicious renal lesion or hydronephrosis. The bladder appears unremarkable for its degree of distention. Stomach/Bowel: Enteric contrast was administered and has passed through the stomach and small bowel into the distal colon. There is moderate stool throughout the colon, especially within the rectum. No bowel distension, wall thickening or focal surrounding inflammation identified. Vascular/Lymphatic: There are no enlarged abdominal or pelvic lymph nodes. Aortic and branch vessel atherosclerosis without evidence of aneurysm. Reproductive: Vaginal pessary in place. The uterus appears atrophied. No evidence  of adnexal mass. Other: No free air, ascites, extravasated enteric contrast or focal extraluminal fluid collection identified. Musculoskeletal: The bones are diffusely demineralized. Unchanged densely sclerotic lesion in the right ilium, consistent with a bone island. There are multiple thoracolumbar compression deformities associated with a convex right scoliosis. Spinal augmentation has been performed at L4 in the interval with some posterior epidural extension of cement. Previous spinal augmentation at T11 and L1. Grossly unchanged chronic compression fractures at L2 and L3. T9 fracture was not previously imaged, although does not appear acute. IMPRESSION: 1. No definite acute findings or explanation for the patient's symptoms. No definite signs of acute diverticulitis. 2. Moderate stool throughout the colon, especially within the rectum. Correlate for constipation. 3. Interval enlargement of large hiatal hernia which appears to have a paraesophageal component but no associated  obstruction. 4. Multiple thoracolumbar compression deformities with interval spinal augmentation at L4. A T9 fracture was not previously imaged, although does not appear acute. 5.  Aortic Atherosclerosis (ICD10-I70.0). Electronically Signed   By: Richardean Sale M.D.   On: 11/17/2022 17:53   DG Abd 2 Views  Result Date: 11/16/2022 CLINICAL DATA:  Left lower quadrant/abdominal pain. EXAM: ABDOMEN - 2 VIEW COMPARISON:  CT scan of the abdomen and pelvis April 30, 2017. FINDINGS: Lung bases are normal. No free air, portal venous gas, or pneumatosis. A large stool ball is identified in the rectum measuring up to 12 cm in greatest diameter. There is an air-filled mildly prominent loop of bowel in the upper pelvis, likely in sigmoid colon which is not dilated by criteria. No evidence of bowel obstruction. A sclerotic lesion is identified in the right iliac bone, just lateral to the SI joint. The patient is status post vertebroplasty at multiple levels. No other abnormalities. IMPRESSION: 1. A large stool ball is identified in the rectum measuring up to 12 cm in greatest diameter. No evidence of bowel obstruction. 2. A sclerotic lesion is identified in the right iliac bone, just lateral to the SI joint. The sclerotic lesion is unchanged since x-rays dated February 24, 2020 and Apr 07, 2021. Electronically Signed   By: Dorise Bullion III M.D.   On: 11/16/2022 09:17    Microbiology: Recent Results (from the past 240 hour(s))  Urine Culture     Status: Abnormal   Collection Time: 11/27/22  9:42 PM   Specimen: Urine, Clean Catch  Result Value Ref Range Status   Specimen Description URINE, CLEAN CATCH  Final   Special Requests   Final    NONE Performed at [facility], [place] [address] [place], [place] [address]    Culture MULTIPLE SPECIES PRESENT, SUGGEST RECOLLECTION (A)  Final   Report Status 11/29/2022 FINAL  Final     Labs: Basic Metabolic Panel: Recent Labs  Lab 11/28/22 0101 11/29/22 0055  11/30/22 0126 12/03/22 0036  NA 134* 133* 132* 132*  K 4.3 4.1 4.1 5.0  CL 102 99 99 99  CO2 '26 28 27 28  '$ GLUCOSE 100* 120* 114* 92  BUN '20 14 17 19  '$ CREATININE 0.89 0.81 0.84 0.88  CALCIUM 8.5* 9.0 8.8* 8.6*  MG 2.1  --   --   --    Liver Function Tests: Recent Labs  Lab 11/29/22 0055  AST 26  ALT 24  ALKPHOS 60  BILITOT 0.7  PROT 5.3*  ALBUMIN 2.8*   No results for input(s): "LIPASE", "AMYLASE" in the last 168 hours. No results for input(s): "AMMONIA" in the last 168  hours. CBC: Recent Labs  Lab 11/29/22 0055  WBC 10.0  HGB 14.3  HCT 39.8  MCV 85.6  PLT 235   Cardiac Enzymes: No results for input(s): "CKTOTAL", "CKMB", "CKMBINDEX", "TROPONINI" in the last 168 hours. BNP: BNP (last 3 results) Recent Labs    11/22/22 1107 11/29/22 1152  BNP 303.9* 789.3*    ProBNP (last 3 results) No results for input(s): "PROBNP" in the last 8760 hours.  CBG: No results for input(s): "GLUCAP" in the last 168 hours.     Signed:  Domenic Polite MD.  [facility] 12/03/2022, 11:33 AM

## 2022-12-03 NOTE — Progress Notes (Signed)
Patient suffers from weaknes and is not able to use a cane or walker for ambulation.  Patient is unable to self-propel in a manual chair due to weakness and unable to operate a POV or power chair.  Patient does have a caregiver who is willing and able to operate the transport chair.

## 2022-12-03 NOTE — TOC Transition Note (Addendum)
Transition of Care [facility]) - CM/SW Discharge Note   Patient Details  Name: [name] MRN: [id number] Date of Birth: 25-Jan-1936  Transition of Care [facility]) CM/SW Contact:  Zenon Mayo, RN Phone Number: 12/03/2022, 8:53 AM   Clinical Narrative:    Patient is for dc today, she is set up with [facility], NCM notified Calvin with [facility] of the dc.  She also wants a transport chair with cushion. She states her aide will be transporting her home today.  She has no preference of DME agency.   NCM made referral to [facility] for transport chair.  Patient will purchase her own shower chair and a regular chair chair cushion.  [employer] does not have any transport chairs available, NCM made referral to Viola with [employer], they can provide one for her either deliver to her home or she can pick up from the store. She also has [employer] brochure information where she can purchase it from and the phone number.   Final next level of care: Home w Home Health Services Barriers to Discharge: No Barriers Identified   Patient Goals and CMS Choice CMS Medicare.gov Compare Post Acute Care list provided to:: Patient Choice offered to / list presented to : Patient  Discharge Placement                         Discharge Plan and Services Additional resources added to the After Visit Summary for                  DME Arranged:  (transport chair) DME Agency: [facility] Date DME Agency Contacted: 12/03/22 Time DME Agency Contacted: [phone] Representative spoke with at DME Agency: Brenton Grills HH Arranged: PT, OT [place] Agency: [place] ([place]) Date [place]: 11/26/22 Time [place]: [place] Representative spoke with at [place]: [place] ([place]) Interventions SDOH Screenings   Food Insecurity: No Food Insecurity (11/22/2022)  Housing: Low Risk  (11/22/2022)  Transportation Needs: No Transportation Needs (11/22/2022)   Utilities: Not At Risk (11/22/2022)  Tobacco Use: Low Risk  (11/28/2022)     Readmission Risk Interventions     No data to display

## 2022-12-03 NOTE — Significant Event (Signed)
Pt and caregiver was given D/C instructions with extensive questioning about mediations and appts. Patient would like to sit on [facility] for awhile before she gets dressed. Back rub was given per patient request, Caregiver is at the bedside for transpiration needs.

## 2022-12-03 NOTE — Care Management Important Message (Signed)
Important Message  Patient Details  Name: [name] MRN: [id number] Date of Birth: 28-Aug-1936   Medicare Important Message Given:  Yes     [name], [name] 12/03/2022, 10:39 AM

## 2022-12-06 DIAGNOSIS — Z9181 History of falling: Secondary | ICD-10-CM | POA: Diagnosis not present

## 2022-12-06 DIAGNOSIS — E039 Hypothyroidism, unspecified: Secondary | ICD-10-CM | POA: Diagnosis not present

## 2022-12-06 DIAGNOSIS — E876 Hypokalemia: Secondary | ICD-10-CM | POA: Diagnosis not present

## 2022-12-06 DIAGNOSIS — I272 Pulmonary hypertension, unspecified: Secondary | ICD-10-CM | POA: Diagnosis not present

## 2022-12-06 DIAGNOSIS — G8929 Other chronic pain: Secondary | ICD-10-CM | POA: Diagnosis not present

## 2022-12-06 DIAGNOSIS — M5010 Cervical disc disorder with radiculopathy, unspecified cervical region: Secondary | ICD-10-CM | POA: Diagnosis not present

## 2022-12-06 DIAGNOSIS — Z86718 Personal history of other venous thrombosis and embolism: Secondary | ICD-10-CM | POA: Diagnosis not present

## 2022-12-06 DIAGNOSIS — Z7901 Long term (current) use of anticoagulants: Secondary | ICD-10-CM | POA: Diagnosis not present

## 2022-12-06 DIAGNOSIS — I480 Paroxysmal atrial fibrillation: Secondary | ICD-10-CM | POA: Diagnosis not present

## 2022-12-06 DIAGNOSIS — Z604 Social exclusion and rejection: Secondary | ICD-10-CM | POA: Diagnosis not present

## 2022-12-06 DIAGNOSIS — H269 Unspecified cataract: Secondary | ICD-10-CM | POA: Diagnosis not present

## 2022-12-06 DIAGNOSIS — M810 Age-related osteoporosis without current pathological fracture: Secondary | ICD-10-CM | POA: Diagnosis not present

## 2022-12-06 DIAGNOSIS — N3000 Acute cystitis without hematuria: Secondary | ICD-10-CM | POA: Diagnosis not present

## 2022-12-06 DIAGNOSIS — M419 Scoliosis, unspecified: Secondary | ICD-10-CM | POA: Diagnosis not present

## 2022-12-06 DIAGNOSIS — I712 Thoracic aortic aneurysm, without rupture, unspecified: Secondary | ICD-10-CM | POA: Diagnosis not present

## 2022-12-06 DIAGNOSIS — K449 Diaphragmatic hernia without obstruction or gangrene: Secondary | ICD-10-CM | POA: Diagnosis not present

## 2022-12-06 DIAGNOSIS — I081 Rheumatic disorders of both mitral and tricuspid valves: Secondary | ICD-10-CM | POA: Diagnosis not present

## 2022-12-06 DIAGNOSIS — I110 Hypertensive heart disease with heart failure: Secondary | ICD-10-CM | POA: Diagnosis not present

## 2022-12-06 DIAGNOSIS — E440 Moderate protein-calorie malnutrition: Secondary | ICD-10-CM | POA: Diagnosis not present

## 2022-12-06 DIAGNOSIS — K5900 Constipation, unspecified: Secondary | ICD-10-CM | POA: Diagnosis not present

## 2022-12-06 DIAGNOSIS — I700 Atherosclerosis of aorta: Secondary | ICD-10-CM | POA: Diagnosis not present

## 2022-12-06 DIAGNOSIS — M545 Low back pain, unspecified: Secondary | ICD-10-CM | POA: Diagnosis not present

## 2022-12-06 DIAGNOSIS — I5032 Chronic diastolic (congestive) heart failure: Secondary | ICD-10-CM | POA: Diagnosis not present

## 2022-12-06 DIAGNOSIS — E871 Hypo-osmolality and hyponatremia: Secondary | ICD-10-CM | POA: Diagnosis not present

## 2022-12-07 NOTE — Progress Notes (Unsigned)
Cardiology Office Note:    Date:  12/12/2022   ID:  [name], DOB 1936/05/07, MRN [id number]  PCP:  Corliss Blacker, MD   [facility] Providers Cardiologist:  Freada Bergeron, MD {  Referring MD: Corliss Blacker, MD    History of Present Illness:    [name] is a 87 y.o. female with a hx of pAfib, chronic diastolic CHF, hypertension, HLD, thoracic aneurysm previously followed by Dr. Lucianne Lei Trigt-not felt to be a candidate for repair given severe kyphosis who was previously followed by Dr. Meda Coffee who now returns to clinic for follow-up.  Per review of the record, TTE 11/28/2016 with LVEF 55 to 60%, mild to moderate AI, moderate MR, severe atrial enlargement, severe TR.  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 Also with paroxysmal Afib s/p spontaneously conversion to normal sinus rhythm on diltiazem and flecainide.  Myoview 01/2017 poor quality study but low risk EF 72%.  Echo 07/2018 EF 55 to 60% with grade 1 DD mildly dilated ascending aorta with moderate MVP with MR and moderate to severe dilated LA moderate TR moderate pulmonary hypertension.  Seen in clinic 03/22/21 where she was feeling okay with mild dyspnea on exertion. TTE at that time showed moderate holosystolic prolapse with moderate-to-severe MR with possible P2 flail. Saw Dr. Burt Knack on 06/05/21 where she was deemed best for medical management given relative lack of symptoms and comorbidities. Repeat TTE showed stable moderate vs moderate-to-severe MR.   Was last seen in clinic on 02/16/22 where she was dealing with a UTI and LLE pain and swelling. LE doppler was negative for DVT. We recommended she follow-up with her PCP for her UTI. She was otherwise stable from a CV standpoint. We stopped flecainide at that time due to advanced age and comorbidities.  Was seen on 05/21/22 where she continued to have significant neuropathic and  arthritic pain. We increased her lasix to 4x/week instead of 3x/week for LE edema.She was otherwise stable from a CV standpoint.  Was admitted in 11/2022 for acute on chronic diastolic HF exacerbation and Afib with RVR. She was diuresed and placed on amiodarone PO in addition to her apixaban, metop and dilt.   Today, the patient states she is not feeling well. She states that she was hesitant to start the medications recommended for her in the hospital and therefore she has not started them today. She wants to discuss them further, which we did extensively. She has been taking her lasix '20mg'$  daily but states her lower extremity edema is worsening. No orthopnea or PND. No chest pain but continues to have lightheadedness and struggles with her balance.   We spent an extensive amount of time going through her medications today.    Past Medical History:  Diagnosis Date   Anxiety    Aortic regurgitation    Breast cancer ([place]) 08/20/2011   R breast DCIS, ER/PR +   Cataract 3 and 10/92   bilateral   Chronic diastolic CHF (congestive heart failure) (HCC)    Compression fracture of fourth lumbar vertebra (HCC)    DVT (deep venous thrombosis) ([place]) 08/2011   LL extremity    Fibromyalgia    Fracture lumbar vertebra-closed (HCC)    Fracture of thoracic vertebra, closed (HCC)    GERD (gastroesophageal reflux disease)    H/O hiatal hernia    History of blood clots  History of radiation therapy 01/2012   R breast   Hypercholesteremia    Hypertension    DR Orinda Kenner   Hypothyroidism    Mitral regurgitation    Mitral valve prolapse    Osteoporosis    PAF (paroxysmal atrial fibrillation) ([place])    a. dx 11/2016.   Pulmonary hypertension ([place])    Rib fractures    Thoracic ascending aortic aneurysm ([place])    a. followed by Dr. Prescott Gum.   Tricuspid regurgitation     Past Surgical History:  Procedure Laterality Date   ANTERIOR CERVICAL DECOMP/DISCECTOMY FUSION N/A 09/09/2015   Procedure:  Anterior Cervical Decompression and Fusion Cervical seven-Thorasic one ;  Surgeon: Erline Levine, MD;  Location: [place] [facility];  Service: Neurosurgery;  Laterality: N/A;   [place]  10/16/2006   RIH - Dr Hassell Done   KYPHOPLASTY N/A 01/26/2013   Procedure: KYPHOPLASTY;  Surgeon: Kristeen Miss, MD;  Location: [place] [facility];  Service: Neurosurgery;  Laterality: N/A;  T11 and L1   KYPHOPLASTY N/A 06/21/2017   Procedure: Lumbar four Kyphoplasty;  Surgeon: Erline Levine, MD;  Location: [facility];  Service: Neurosurgery;  Laterality: N/A;   MASTECTOMY, PARTIAL  10/17/2011   Procedure: MASTECTOMY PARTIAL;  Surgeon: Haywood Lasso, MD;  Location: [place];  Service: General;  Laterality: Right;  needle guided   TEE WITHOUT CARDIOVERSION N/A 12/04/2016   Procedure: TRANSESOPHAGEAL ECHOCARDIOGRAM (TEE);  Surgeon: Pixie Casino, MD;  Location: [facility];  Service: Cardiovascular;  Laterality: N/A;   TONSILLECTOMY  1944    Current Medications: Current Meds  Medication Sig   acetaminophen (TYLENOL) 325 MG tablet Take 2 tablets (650 mg total) by mouth every 6 (six) hours as needed for moderate pain or mild pain.   amiodarone (PACERONE) 200 MG tablet Take 1 tablet (200 mg total) by mouth daily.   apixaban (ELIQUIS) 2.5 MG TABS tablet Take 1 tablet (2.5 mg total) by mouth 2 (two) times daily.   Ascorbic Acid (VITA-C PO) Take 1,000 mg by mouth daily.   bisacodyl (DULCOLAX) 5 MG EC tablet Take 1 tablet (5 mg total) by mouth daily as needed for moderate constipation.   calcium citrate-vitamin D (CITRACAL+D) 315-200 MG-UNIT per tablet Take 1 tablet by mouth 2 (two) times daily. (Patient taking differently: Take 2 tablets by mouth daily.)   cholecalciferol (VITAMIN D) 1000 UNITS tablet Take 2,000 Units by mouth every evening. Vitamin D3   diclofenac Sodium (VOLTAREN) 1 % GEL Apply 2 g topically 3 (three) times daily as  needed (join pains).   diltiazem (CARDIZEM CD) 180 MG 24 hr capsule Take 1 capsule by mouth daily   estradiol (ESTRACE) 0.1 MG/GM vaginal cream Place 1 Applicatorful vaginally once a week.   HYDROcodone-acetaminophen (NORCO/VICODIN) 5-325 MG tablet Take 1-2 tablets by mouth every 6 (six) hours as needed (mild pain).   iron polysaccharides (NIFEREX) 150 MG capsule Take 1 capsule (150 mg total) by mouth 3 (three) times a week. M, W, F   levothyroxine (SYNTHROID) 125 MCG tablet Take 1 tablet (125 mcg total) by mouth daily before breakfast.   Meclizine HCl (BONINE PO) Take 12.5 mg by mouth daily as needed (dizziness).   Multiple Vitamin (MULTIVITAMIN) tablet Take 1 tablet by mouth daily with breakfast.   OVER THE COUNTER MEDICATION Take 1,000 mg by mouth daily. D-Mannose 500 mg  pantoprazole (PROTONIX) 40 MG tablet Take 1 tablet (40 mg total) by mouth daily.   potassium chloride SA (KLOR-CON M) 20 MEQ tablet Take 1 tablet (20 mEq total) by mouth twice daily for 4 days only, then decrease to taking 1 tablet (20 mEq total) by mouth daily thereafter.   pravastatin (PRAVACHOL) 20 MG tablet Take 1 tablet (20 mg total) by mouth daily with supper.   risedronate (ACTONEL) 150 MG tablet Take 150 mg by mouth every 30 (thirty) days. with water on empty stomach, nothing by mouth or lie down for next 30 minutes.   senna-docusate (SENOKOT-S) 8.6-50 MG tablet Take 1 tablet by mouth 2 (two) times daily.   solifenacin (VESICARE) 10 MG tablet Take 1 tablet (10 mg total) by mouth daily.   sucralfate (CARAFATE) 1 g tablet Take 1 tablet (1 g total) by mouth 4 (four) times daily -  with meals and at bedtime. Tablets may be dissolved in water before administration.   torsemide (DEMADEX) 20 MG tablet Take 1 tablet (20 mg total) by mouth twice daily for 4 days only, then decrease to taking 1 tablet (20 mg total) by mouth daily thereafter.   [DISCONTINUED] amiodarone (PACERONE) 200 MG tablet Take 1 tablet (200 mg total) by  mouth 2 (two) times daily for 2 weeks then take 1 tablet ('200mg'$ ) daily   [DISCONTINUED] furosemide (LASIX) 20 MG tablet Take 1 tablet (20 mg total) by mouth daily. Take one tablet by mouth three times a week   [DISCONTINUED] metoprolol tartrate (LOPRESSOR) 25 MG tablet Take 1 tablet (25 mg total) by mouth 2 (two) times daily.   [DISCONTINUED] pantoprazole (PROTONIX) 40 MG tablet Take 1 tablet twice daily for 15 days and then continue once daily.     Allergies:   Contrast media [iodinated contrast media], Iohexol, Mucinex [guaifenesin er], Zanaflex [tizanidine hcl], Penicillins, and Robaxin [methocarbamol]   Social History   Socioeconomic History   Marital status: Widowed    Spouse name: Not on file   Number of children: Not on file   Years of education: Not on file   Highest education level: Not on file  Occupational History   Not on file  Tobacco Use   Smoking status: Never   Smokeless tobacco: Never  Vaping Use   Vaping Use: Never used  Substance and Sexual Activity   Alcohol use: No   Drug use: No   Sexual activity: Never  Other Topics Concern   Not on file  Social History Narrative   Not on file   Social Determinants of Health   Financial Resource Strain: Not on file  Food Insecurity: No Food Insecurity (11/22/2022)   Hunger Vital Sign    Worried About Running Out of Food in the Last Year: Never true    Ran Out of Food in the Last Year: Never true  Transportation Needs: No Transportation Needs (11/22/2022)   PRAPARE - Hydrologist (Medical): No    Lack of Transportation (Non-Medical): No  Physical Activity: Not on file  Stress: Not on file  Social Connections: Not on file     Family History: The patient's family history includes Heart disease in her maternal grandfather, maternal uncle, and mother.  ROS:   Please see the history of present illness.   Review of Systems  Constitutional:  Positive for malaise/fatigue. Negative for  chills and fever.  HENT:  Negative for nosebleeds and tinnitus.   Eyes:  Negative for blurred vision and pain.  Respiratory:  Positive for cough. Negative for hemoptysis, shortness of breath and stridor.   Cardiovascular:  Positive for leg swelling. Negative for chest pain, palpitations, orthopnea, claudication and PND.  Gastrointestinal:  Negative for blood in stool, diarrhea, nausea and vomiting.  Genitourinary:  Negative for dysuria and hematuria.  Musculoskeletal:  Positive for back pain and myalgias (lower extremity). Negative for falls.  Neurological:  Positive for dizziness, tingling (lower extremity) and weakness. Negative for loss of consciousness and headaches.       + loss of balance  Psychiatric/Behavioral:  Negative for depression, hallucinations and substance abuse. The patient does not have insomnia.     EKGs/Labs/Other Studies Reviewed:    The following studies were reviewed today: TTE 11/26/21: IMPRESSIONS   1. Left ventricular ejection fraction, by estimation, is 60 to 65%. The left ventricle has normal function. The left ventricle has no regional wall motion abnormalities. There is moderate hypertrophy of the basal septal segment. The rest of the LV  segments demonstrate mild asymmetric left ventricular hypertrophy of the basal-septal segment. Left ventricular diastolic parameters are consistent with Grade I diastolic dysfunction (impaired relaxation).   2. Right ventricular systolic function is normal. The right ventricular size is mildly enlarged. There is severely elevated pulmonary artery systolic pressure. The estimated right ventricular systolic pressure is  35.0 mmHg.   3. Left atrial size was severely dilated.   4. Right atrial size was mildly dilated.   5. The mitral valve is myxomatous. There is bileaflet prolapse that is more notable in the posterior mitral valve leaflet. There is moderate mitral regurgitation EROA 0.23 with Rvol 27m.   6. Tricuspid valve  regurgitation is moderate to severe.   7. The aortic valve is tricuspid. There is mild calcification of the aortic valve. There is mild thickening of the aortic valve. Aortic valve regurgitation is mild to moderate.   8. Aortic dilatation noted. There is moderate-to-severe dilatation of the ascending aorta, measuring 49 mm. There is mild dilatation of the aortic root, measuring 40 mm.   Comparison(s): Compared to prior TTE in 04/2021, the mitral regurgitation  appears moderate on current study which is slightly less than prior in the  setting of a lower systolic blood pressure. The ascending aortic size  remains stably dilated at 465m (previously 5027m   TTE 04/2021: IMPRESSIONS   1. Left ventricular ejection fraction, by estimation, is 60 to 65%. The left ventricle has normal function. The left ventricle has no regional wall motion abnormalities. There is moderate asymmetric hypertrophy of the basal septum. The rest of the LV  segments demonstrate mild left ventricular hypertrophy. Left ventricular diastolic parameters are consistent with Grade I diastolic dysfunction (impaired relaxation).   2. Right ventricular systolic function is normal. The right ventricular size is normal. There is mildly elevated pulmonary artery systolic pressure. The estimated right ventricular systolic pressure is 45.09.3Hg.   3. Left atrial size was severely dilated.   4. The mitral valve is myxomatous. There is moderate holosystolic prolapse of of the mitral valve most notably of the posterior mitral valve leaflet with possible P2 flail. Using a PISA radius of 0.9, there is at least moderate-to-severe mitral  regurgitation with EROA 0.3cm2, RVol 53m33mecommend TEE for further evaluation.   5. Tricuspid valve regurgitation is moderate.   6. The aortic valve is tricuspid. There is mild calcification of the  aortic valve. There is mild thickening of the aortic valve. Aortic valve regurgitation is mild to moderate.  Mild to  moderate aortic valve sclerosis/calcification is present, without any evidence of aortic stenosis.   7. Aortic dilatation noted. There is mild dilatation of the aortic root, measuring 39 mm. There is moderate-to-severe dilatation of the ascending aorta, measuring 50 mm.   8. The inferior vena cava is normal in size with greater than 50% respiratory variability, suggesting right atrial pressure of 3 mmHg.   9. Right atrial size was mildly dilated.   Comparison(s): Compared to prior echo, the MR now appears severe. The ascending aorta now measures 3m (previously 452m.  2D echo 08/06/2018 Study Conclusions   - Left ventricle: The cavity size was normal. There was severe    focal basal hypertrophy. Systolic function was normal. The    estimated ejection fraction was in the range of 55% to 60%.   Wall motion was normal; there were no regional wall motion    abnormalities. There was an increased relative contribution of    atrial contraction to ventricular filling. Doppler parameters are    consistent with abnormal left ventricular relaxation (grade 1    diastolic dysfunction). Doppler parameters are consistent with    high ventricular filling pressure.  - Aortic valve: Trileaflet; mildly thickened, mildly calcified    leaflets. There was mild regurgitation.  - Aorta: Ascending aorta diameter: 41 mm (ED).  - Ascending aorta: The ascending aorta was mildly dilated.  - Mitral valve: Mild diffuse thickening, consistent with myxomatous    proliferation. Moderate, holosystolic prolapse, involving the    posterior > anterior leaflet. There was moderate regurgitation.    Effective regurgitant orifice (PISA): 0.15 cm^2. Regurgitant    volume (PISA): 32 ml.  - Left atrium: The atrium was moderately to severely dilated.  - Tricuspid valve: There was moderate regurgitation.  - Pulmonary arteries: PA peak pressure: 45 mm Hg (S).   Impressions:   - The right ventricular systolic pressure was  increased consistent    with moderate pulmonary hypertension.  Myoview 01/2017: The nuclear study is very poor quality due to body habitus Defect 1: There is a defect present in the basal inferolateral and mid inferolateral location that is most likely artifact due to uptake in structures below the diaphragm. This is a low risk study. Nuclear stress EF: 72%. The left ventricular ejection fraction is hyperdynamic (>65%).     EKG:  No new tracing today 05/21/2022 EKG: NSR, iRBBB, LAFB, PAC HR 87 02/16/2022: not ordered today 03/22/21: NSR, LAFB, anterior infarct (unchanged), first degree AVB, HR 65  Recent Labs: 11/28/2022: Magnesium 2.1 11/29/2022: ALT 24; B Natriuretic Peptide 789.3; Hemoglobin 14.3; Platelets 235; TSH 2.071 12/03/2022: BUN 19; Creatinine, Ser 0.88; Potassium 5.0; Sodium 132  Recent Lipid Panel    Component Value Date/Time   CHOL  12/02/2010 0400    149        ATP III CLASSIFICATION:  <200     mg/dL   Desirable  200-239  mg/dL   Borderline High  >=240    mg/dL   High          TRIG 54 12/02/2010 0400   HDL 58 12/02/2010 0400   CHOLHDL 2.6 12/02/2010 0400   VLDL 11 12/02/2010 0400   LDLCALC  12/02/2010 0400    80        Total Cholesterol/HDL:CHD Risk Coronary Heart Disease Risk Table                     Men   Women  1/2 Average Risk   3.4  3.3  Average Risk       5.0   4.4  2 X Average Risk   9.6   7.1  3 X Average Risk  23.4   11.0        Use the calculated Patient Ratio above and the CHD Risk Table to determine the patient's CHD Risk.        ATP III CLASSIFICATION (LDL):  <100     mg/dL   Optimal  100-129  mg/dL   Near or Above                    Optimal  130-159  mg/dL   Borderline  160-189  mg/dL   High  >190     mg/dL   Very High     Risk Assessment/Calculations:    CHA2DS2-VASc Score = 6  This indicates a 9.7% annual risk of stroke. The patient's score is based upon: CHF History: 1 HTN History: 1 Diabetes History: 0 Stroke History:  0 Vascular Disease History: 1 Age Score: 2 Gender Score: 1     Physical Exam:    VS:  BP 110/74   Pulse 75   Ht '4\' 11"'$  (1.499 m)   Wt 104 lb 3.2 oz (47.3 kg)   SpO2 99%   BMI 21.05 kg/m     Wt Readings from Last 3 Encounters:  12/12/22 104 lb 3.2 oz (47.3 kg)  12/03/22 103 lb 13.4 oz (47.1 kg)  10/23/22 103 lb (46.7 kg)     GEN: Elderly female, kyphotic HEENT:  Left eye strabismus NECK: No JVD; No carotid bruits CARDIAC: RRR, 3/6 systolic murmur heard throughout the precordium RESPIRATORY:  Diminished but clear ABDOMEN: Soft, non-tender, non-distended MUSCULOSKELETAL:3+ bilateral pitting edema, warm SKIN: Chronic venous stasis changes NEUROLOGIC:  Alert and oriented x 3 PSYCHIATRIC:  Normal affect   ASSESSMENT:    1. Acute on chronic diastolic heart failure ([place])   2. Medication management   3. Severe mitral regurgitation   4. PAF (paroxysmal atrial fibrillation) ([place])   5. Chronic bilateral thoracic back pain   6. Aneurysm of ascending aorta without rupture ([place])   7. Essential hypertension   8. Secondary hypercoagulable state ([place])       PLAN:    In order of problems listed above:  No problem-specific Assessment & Plan notes found for this encounter.  #Acute on Chronic diastolic CHF: TTE 01/5008 with EF 60-65%, severe RV enlargement with preserved function, PASP 74, severe MR, mild to moderate AR, severe aortic dilation 40m, severe TR. Not a surgical candidate. Currently grossly overloaded on exam with NYHA class III symptoms. Will adjust medications as below and arrange for close CV follow-up. -Change lasix to torsemide '20mg'$  BID x4 days and then daily thereafter -Start potassium 272m BID x4 days and then daily thereafter -Off losartan due to soft blood pressures in the hospital -Declined 2 week follow-up but will be seen in 4 weeks for re-evaluation -Check BMET next week  #Paroxysmal Afib: CHADs-vasc 6. With recent admission for Afib with RVR now on  amiodarone, dilt and metop for rate/rhythm control and apixaban for AC. -Resume amiodarone '200mg'$  daily -Continue metop '25mg'$  BID -Continue diltiazem '180mg'$  daily -Continue apixaban '5mg'$  BID   #Thoracic aortic aneurysm: Previously followed by Dr. VaPrescott Gumow transitioned to Dr. AtOrvan SeenManaged conservatively as deemed to be not a surgical candidate -Continue metop '25mg'$  BID -Continue pravsatatin '20mg'$    #Valvular heart disease with moderate MVP with severe MR, Severe TR  mild AI and severe pulmonary hypertension: TTE 05/2022 with EF 60-65%, severe RV enlargement with preserved function, PASP 74, severe MR, mild to moderate AR, severe aortic dilation 48m, severe TR. Not a surgical candidate. On medical therapy.  -Medical management -Change lasix to torsemide '20mg'$  BID x4 days and then daily thereafter -Start potassium 256m BID x4 days and then daily thereafter   #Hyperlipidemia: -Continue pravasatin '20mg'$  daily   Medication Adjustments/Labs and Tests Ordered: Current medicines are reviewed at length with the patient today.  Concerns regarding medicines are outlined above.  Orders Placed This Encounter  Procedures   Basic metabolic panel   Meds ordered this encounter  Medications   metoprolol tartrate (LOPRESSOR) 25 MG tablet    Sig: Take 1 tablet (25 mg total) by mouth 2 (two) times daily.    Dispense:  180 tablet    Refill:  1   pantoprazole (PROTONIX) 40 MG tablet    Sig: Take 1 tablet (40 mg total) by mouth daily.    Dispense:  30 tablet    Refill:  6   amiodarone (PACERONE) 200 MG tablet    Sig: Take 1 tablet (200 mg total) by mouth daily.    Dispense:  90 tablet    Refill:  3   torsemide (DEMADEX) 20 MG tablet    Sig: Take 1 tablet (20 mg total) by mouth twice daily for 4 days only, then decrease to taking 1 tablet (20 mg total) by mouth daily thereafter.    Dispense:  40 tablet    Refill:  0   potassium chloride SA (KLOR-CON M) 20 MEQ tablet    Sig: Take 1 tablet (20 mEq  total) by mouth twice daily for 4 days only, then decrease to taking 1 tablet (20 mEq total) by mouth daily thereafter.    Dispense:  40 tablet    Refill:  0    Take in concurrence with torsemide dosing.  Please council patient on this when she picks this up.    Patient Instructions  Medication Instructions:   STOP TAKING LASIX NOW  STOP TAKING LOSARTAN NOW  START TAKING PROTONIX 40 MG BY MOUTH DAILY  START TAKING METOPROLOL TARTRATE 25 MG BY MOUTH TWICE DAILY  START TAKING AMIODARONE 200 MG BY MOUTH DAILY  START TAKING TORSEMIDE 20 MG BY MOUTH TWICE DAILY FOR 4 DAYS ONLY, THEN DECREASE TO TAKING 20 MG BY MOUTH DAILY THEREAFTER  START TAKING POTASSIUM CHLORIDE 20 mEq BY MOUTH TWICE DAILY FOR 4 DAYS ONLY, THEN DECREASE TO TAKING 20 mEq BY MOUTH DAILY THEREAFTER--TAKE IN CONCURRENCE WITH YOUR TORSEMIDE DOSING  *If you need a refill on your cardiac medications before your next appointment, please call your pharmacy*   Lab Work:  [place]--BM[facility]  If you have labs (blood work) drawn today and your tests are completely normal, you will receive your results only by: [place] you have MyChart) OR A paper copy in the mail If you have any lab test that is abnormal or we need to change your treatment, we will call you to review the results.    Follow-Up:   2 WEEKS WITH AN EXTENDER IN THE OFFICE--SCHEDULE WITH AN EXTENDER PER DR. PEDyke BrackettMD

## 2022-12-10 DIAGNOSIS — I10 Essential (primary) hypertension: Secondary | ICD-10-CM | POA: Diagnosis not present

## 2022-12-10 DIAGNOSIS — G8929 Other chronic pain: Secondary | ICD-10-CM | POA: Diagnosis not present

## 2022-12-10 DIAGNOSIS — I4821 Permanent atrial fibrillation: Secondary | ICD-10-CM | POA: Diagnosis not present

## 2022-12-10 DIAGNOSIS — I509 Heart failure, unspecified: Secondary | ICD-10-CM | POA: Diagnosis not present

## 2022-12-10 DIAGNOSIS — Z9989 Dependence on other enabling machines and devices: Secondary | ICD-10-CM | POA: Diagnosis not present

## 2022-12-12 ENCOUNTER — Ambulatory Visit: Payer: Medicare Other | Attending: Cardiology | Admitting: Cardiology

## 2022-12-12 ENCOUNTER — Encounter: Payer: Self-pay | Admitting: Cardiology

## 2022-12-12 VITALS — BP 110/74 | HR 75 | Ht 59.0 in | Wt 104.2 lb

## 2022-12-12 DIAGNOSIS — N3000 Acute cystitis without hematuria: Secondary | ICD-10-CM | POA: Diagnosis not present

## 2022-12-12 DIAGNOSIS — K449 Diaphragmatic hernia without obstruction or gangrene: Secondary | ICD-10-CM | POA: Diagnosis not present

## 2022-12-12 DIAGNOSIS — I5033 Acute on chronic diastolic (congestive) heart failure: Secondary | ICD-10-CM | POA: Insufficient documentation

## 2022-12-12 DIAGNOSIS — I10 Essential (primary) hypertension: Secondary | ICD-10-CM | POA: Diagnosis not present

## 2022-12-12 DIAGNOSIS — K5900 Constipation, unspecified: Secondary | ICD-10-CM | POA: Diagnosis not present

## 2022-12-12 DIAGNOSIS — I7121 Aneurysm of the ascending aorta, without rupture: Secondary | ICD-10-CM | POA: Insufficient documentation

## 2022-12-12 DIAGNOSIS — M546 Pain in thoracic spine: Secondary | ICD-10-CM | POA: Insufficient documentation

## 2022-12-12 DIAGNOSIS — I5032 Chronic diastolic (congestive) heart failure: Secondary | ICD-10-CM | POA: Diagnosis not present

## 2022-12-12 DIAGNOSIS — D6869 Other thrombophilia: Secondary | ICD-10-CM | POA: Insufficient documentation

## 2022-12-12 DIAGNOSIS — G8929 Other chronic pain: Secondary | ICD-10-CM | POA: Diagnosis not present

## 2022-12-12 DIAGNOSIS — I480 Paroxysmal atrial fibrillation: Secondary | ICD-10-CM | POA: Diagnosis not present

## 2022-12-12 DIAGNOSIS — I110 Hypertensive heart disease with heart failure: Secondary | ICD-10-CM | POA: Diagnosis not present

## 2022-12-12 DIAGNOSIS — Z79899 Other long term (current) drug therapy: Secondary | ICD-10-CM | POA: Insufficient documentation

## 2022-12-12 DIAGNOSIS — I340 Nonrheumatic mitral (valve) insufficiency: Secondary | ICD-10-CM | POA: Diagnosis not present

## 2022-12-12 MED ORDER — AMIODARONE HCL 200 MG PO TABS: 200.0000 mg | ORAL_TABLET | Freq: Every day | ORAL | 3 refills | Status: DC

## 2022-12-12 MED ORDER — TORSEMIDE 20 MG PO TABS: ORAL_TABLET | ORAL | 0 refills | Status: DC

## 2022-12-12 MED ORDER — POTASSIUM CHLORIDE CRYS ER 20 MEQ PO TBCR: EXTENDED_RELEASE_TABLET | ORAL | 0 refills | Status: DC

## 2022-12-12 MED ORDER — PANTOPRAZOLE SODIUM 40 MG PO TBEC: 40.0000 mg | DELAYED_RELEASE_TABLET | Freq: Every day | ORAL | 6 refills | Status: DC

## 2022-12-12 MED ORDER — METOPROLOL TARTRATE 25 MG PO TABS: 25.0000 mg | ORAL_TABLET | Freq: Two times a day (BID) | ORAL | 1 refills | Status: DC

## 2022-12-12 NOTE — Patient Instructions (Signed)
Medication Instructions:   STOP TAKING LASIX NOW  STOP TAKING LOSARTAN NOW  START TAKING PROTONIX 40 MG BY MOUTH DAILY  START TAKING METOPROLOL TARTRATE 25 MG BY MOUTH TWICE DAILY  START TAKING AMIODARONE 200 MG BY MOUTH DAILY  START TAKING TORSEMIDE 20 MG BY MOUTH TWICE DAILY FOR 4 DAYS ONLY, THEN DECREASE TO TAKING 20 MG BY MOUTH DAILY THEREAFTER  START TAKING POTASSIUM CHLORIDE 20 mEq BY MOUTH TWICE DAILY FOR 4 DAYS ONLY, THEN DECREASE TO TAKING 20 mEq BY MOUTH DAILY THEREAFTER--TAKE IN CONCURRENCE WITH YOUR TORSEMIDE DOSING  *If you need a refill on your cardiac medications before your next appointment, please call your pharmacy*   Lab Work:  [place] OFFICE--BMET  If you have labs (blood work) drawn today and your tests are completely normal, you will receive your results only by: MyChart Message (if you have MyChart) OR A paper copy in the mail If you have any lab test that is abnormal or we need to change your treatment, we will call you to review the results.    Follow-Up:   2 WEEKS WITH AN EXTENDER IN THE OFFICE--SCHEDULE WITH AN EXTENDER PER DR. Johney Frame

## 2022-12-13 NOTE — Progress Notes (Deleted)
[place] Phone: [phone] Subjective:    I'm seeing this patient by the request  of:  Corliss Blacker, MD  CC:   [id number]  10/23/2022 Patient given injections today and tolerated the procedure well.  I still feel the underlying muscle imbalance, scoliosis, arthritic changes, as well as osteoporosis is causing more of the chronic pain.  We discussed medications including the Cymbalta as well as osteoporosis medicine such as Prolia which patient will not do.  Discussed with her at this moment I do not know if I have anything else that would be extremely helpful for her then.  Patient wanted to see neurology secondary to the tingling still in her hands and feet.  We discussed with her that I do not know for another significant workup would be helpful but will refer her so she can discuss.  We discussed with her otherwise we need to consider other things such as a spine stimulator.  Follow-up with me again in 2 to 3 months.  Total time with patient 47 minutes     Update 12/18/2022 [name] is a 87 y.o. female coming in with complaint of R shoulder pain. Patient states        Past Medical History:  Diagnosis Date   Anxiety    Aortic regurgitation    Breast cancer ([place]) 08/20/2011   R breast DCIS, ER/PR +   Cataract 3 and 10/92   bilateral   Chronic diastolic CHF (congestive heart failure) (HCC)    Compression fracture of fourth lumbar vertebra (HCC)    DVT (deep venous thrombosis) ([place]) 08/2011   LL extremity    Fibromyalgia    Fracture lumbar vertebra-closed (HCC)    Fracture of thoracic vertebra, closed (HCC)    GERD (gastroesophageal reflux disease)    H/O hiatal hernia    History of blood clots    History of radiation therapy 01/2012   R breast   Hypercholesteremia    Hypertension    DR Orinda Kenner   Hypothyroidism    Mitral regurgitation    Mitral valve prolapse    Osteoporosis     PAF (paroxysmal atrial fibrillation) ([place])    a. dx 11/2016.   Pulmonary hypertension ([place])    Rib fractures    Thoracic ascending aortic aneurysm ([place])    a. followed by Dr. Prescott Gum.   Tricuspid regurgitation    Past Surgical History:  Procedure Laterality Date   ANTERIOR CERVICAL DECOMP/DISCECTOMY FUSION N/A 09/09/2015   Procedure: Anterior Cervical Decompression and Fusion Cervical seven-Thorasic one ;  Surgeon: Erline Levine, MD;  Location: [place];  Service: Neurosurgery;  Laterality: N/A;   [place]  10/16/2006   RIH - Dr Hassell Done   KYPHOPLASTY N/A 01/26/2013   Procedure: KYPHOPLASTY;  Surgeon: Kristeen Miss, MD;  Location: [place] [facility];  Service: Neurosurgery;  Laterality: N/A;  T11 and L1   KYPHOPLASTY N/A 06/21/2017   Procedure: Lumbar four Kyphoplasty;  Surgeon: Erline Levine, MD;  Location: [facility];  Service: Neurosurgery;  Laterality: N/A;   MASTECTOMY, PARTIAL  10/17/2011   Procedure: MASTECTOMY PARTIAL;  Surgeon: Haywood Lasso, MD;  Location: [facility];  Service: General;  Laterality: Right;  needle guided   TEE WITHOUT CARDIOVERSION N/A 12/04/2016   Procedure: TRANSESOPHAGEAL ECHOCARDIOGRAM (TEE);  Surgeon: Pixie Casino, MD;  Location: [facility];  Service: Cardiovascular;  Laterality: N/A;   TONSILLECTOMY  1944   Social History   Socioeconomic History   Marital status: Widowed    Spouse name: Not on file   Number of children: Not on file   Years of education: Not on file   Highest education level: Not on file  Occupational History   Not on file  Tobacco Use   Smoking status: Never   Smokeless tobacco: Never  Vaping Use   Vaping Use: Never used  Substance and Sexual Activity   Alcohol use: No   Drug use: No   Sexual activity: Never  Other Topics Concern   Not on file  Social History Narrative   Not on file   Social Determinants of Health   Financial  Resource Strain: Not on file  Food Insecurity: No Food Insecurity (11/22/2022)   Hunger Vital Sign    Worried About Running Out of Food in the Last Year: Never true    Ran Out of Food in the Last Year: Never true  Transportation Needs: No Transportation Needs (11/22/2022)   PRAPARE - Hydrologist (Medical): No    Lack of Transportation (Non-Medical): No  Physical Activity: Not on file  Stress: Not on file  Social Connections: Not on file   Allergies  Allergen Reactions   Contrast Media [Iodinated Contrast Media] Shortness Of Breath   Iohexol Shortness Of Breath   Mucinex [Guaifenesin Er] Other (See Comments)    DIZZINESS   Zanaflex [Tizanidine Hcl] Anxiety and Other (See Comments)    Dizziness   Penicillins Rash    Has patient had a PCN reaction causing immediate rash, facial/tongue/throat swelling, SOB or lightheadedness with hypotension: No Has patient had a PCN reaction causing severe rash involving mucus membranes or skin necrosis: No Has patient had a PCN reaction that required hospitalization No Has patient had a PCN reaction occurring within the last 10 years: No If all of the above answers are "NO", then may proceed with Cephalosporin use.    Robaxin [Methocarbamol] Diarrhea   Family History  Problem Relation Age of Onset   Heart disease Mother    Heart disease Maternal Uncle    Heart disease Maternal Grandfather     Current Outpatient Medications (Endocrine & Metabolic):    levothyroxine (SYNTHROID) 125 MCG tablet, Take 1 tablet (125 mcg total) by mouth daily before breakfast.   risedronate (ACTONEL) 150 MG tablet, Take 150 mg by mouth every 30 (thirty) days. with water on empty stomach, nothing by mouth or lie down for next 30 minutes.  Current Outpatient Medications (Cardiovascular):    amiodarone (PACERONE) 200 MG tablet, Take 1 tablet (200 mg total) by mouth daily.   diltiazem (CARDIZEM CD) 180 MG 24 hr capsule, Take 1 capsule by mouth  daily   metoprolol tartrate (LOPRESSOR) 25 MG tablet, Take 1 tablet (25 mg total) by mouth 2 (two) times daily.   pravastatin (PRAVACHOL) 20 MG tablet, Take 1 tablet (20 mg total) by mouth daily with supper.   torsemide (DEMADEX) 20 MG tablet, Take 1 tablet (20 mg total) by mouth twice daily for 4 days only, then decrease to taking 1 tablet (20 mg total) by mouth daily thereafter.   Current Outpatient Medications (Analgesics):    acetaminophen (TYLENOL) 325 MG tablet, Take 2 tablets (650 mg total) by mouth every 6 (six) hours as needed for moderate pain or mild pain.  HYDROcodone-acetaminophen (NORCO/VICODIN) 5-325 MG tablet, Take 1-2 tablets by mouth every 6 (six) hours as needed (mild pain).  Current Outpatient Medications (Hematological):    apixaban (ELIQUIS) 2.5 MG TABS tablet, Take 1 tablet (2.5 mg total) by mouth 2 (two) times daily.   iron polysaccharides (NIFEREX) 150 MG capsule, Take 1 capsule (150 mg total) by mouth 3 (three) times a week. M, W, F  Current Outpatient Medications (Other):    Ascorbic Acid (VITA-C PO), Take 1,000 mg by mouth daily.   bisacodyl (DULCOLAX) 5 MG EC tablet, Take 1 tablet (5 mg total) by mouth daily as needed for moderate constipation.   calcium citrate-vitamin D (CITRACAL+D) 315-200 MG-UNIT per tablet, Take 1 tablet by mouth 2 (two) times daily. (Patient taking differently: Take 2 tablets by mouth daily.)   cholecalciferol (VITAMIN D) 1000 UNITS tablet, Take 2,000 Units by mouth every evening. Vitamin D3   diclofenac Sodium (VOLTAREN) 1 % GEL, Apply 2 g topically 3 (three) times daily as needed (join pains).   estradiol (ESTRACE) 0.1 MG/GM vaginal cream, Place 1 Applicatorful vaginally once a week.   Meclizine HCl (BONINE PO), Take 12.5 mg by mouth daily as needed (dizziness).   Multiple Vitamin (MULTIVITAMIN) tablet, Take 1 tablet by mouth daily with breakfast.   OVER THE COUNTER MEDICATION, Take 1,000 mg by mouth daily. D-Mannose 500 mg   pantoprazole  (PROTONIX) 40 MG tablet, Take 1 tablet (40 mg total) by mouth daily.   potassium chloride SA (KLOR-CON M) 20 MEQ tablet, Take 1 tablet (20 mEq total) by mouth twice daily for 4 days only, then decrease to taking 1 tablet (20 mEq total) by mouth daily thereafter.   senna-docusate (SENOKOT-S) 8.6-50 MG tablet, Take 1 tablet by mouth 2 (two) times daily.   solifenacin (VESICARE) 10 MG tablet, Take 1 tablet (10 mg total) by mouth daily.   sucralfate (CARAFATE) 1 g tablet, Take 1 tablet (1 g total) by mouth 4 (four) times daily -  with meals and at bedtime. Tablets may be dissolved in water before administration.   Reviewed prior external information including notes and imaging from  primary care provider As well as notes that were available from care everywhere and other healthcare systems.  Past medical history, social, surgical and family history all reviewed in electronic medical record.  No pertanent information unless stated regarding to the chief complaint.   Review of Systems:  No headache, visual changes, nausea, vomiting, diarrhea, constipation, dizziness, abdominal pain, skin rash, fevers, chills, night sweats, weight loss, swollen lymph nodes, body aches, joint swelling, chest pain, shortness of breath, mood changes. POSITIVE muscle aches  Objective  There were no vitals taken for this visit.   General: No apparent distress alert and oriented x3 mood and affect normal, dressed appropriately.  HEENT: Pupils equal, extraocular movements intact  Respiratory: Patient's speak in full sentences and does not appear short of breath  Cardiovascular: No lower extremity edema, non tender, no erythema      Impression and Recommendations:

## 2022-12-14 DIAGNOSIS — I5032 Chronic diastolic (congestive) heart failure: Secondary | ICD-10-CM | POA: Diagnosis not present

## 2022-12-14 DIAGNOSIS — K449 Diaphragmatic hernia without obstruction or gangrene: Secondary | ICD-10-CM | POA: Diagnosis not present

## 2022-12-14 DIAGNOSIS — I480 Paroxysmal atrial fibrillation: Secondary | ICD-10-CM | POA: Diagnosis not present

## 2022-12-14 DIAGNOSIS — I110 Hypertensive heart disease with heart failure: Secondary | ICD-10-CM | POA: Diagnosis not present

## 2022-12-14 DIAGNOSIS — N3000 Acute cystitis without hematuria: Secondary | ICD-10-CM | POA: Diagnosis not present

## 2022-12-14 DIAGNOSIS — K5900 Constipation, unspecified: Secondary | ICD-10-CM | POA: Diagnosis not present

## 2022-12-18 ENCOUNTER — Ambulatory Visit: Payer: Medicare Other | Admitting: Family Medicine

## 2022-12-18 ENCOUNTER — Ambulatory Visit: Payer: Medicare Other | Attending: Cardiology

## 2022-12-18 ENCOUNTER — Telehealth: Payer: Self-pay | Admitting: *Deleted

## 2022-12-18 DIAGNOSIS — G8929 Other chronic pain: Secondary | ICD-10-CM | POA: Diagnosis not present

## 2022-12-18 DIAGNOSIS — R600 Localized edema: Secondary | ICD-10-CM

## 2022-12-18 DIAGNOSIS — E876 Hypokalemia: Secondary | ICD-10-CM

## 2022-12-18 DIAGNOSIS — I5033 Acute on chronic diastolic (congestive) heart failure: Secondary | ICD-10-CM | POA: Diagnosis not present

## 2022-12-18 DIAGNOSIS — I480 Paroxysmal atrial fibrillation: Secondary | ICD-10-CM | POA: Diagnosis not present

## 2022-12-18 DIAGNOSIS — Z79899 Other long term (current) drug therapy: Secondary | ICD-10-CM

## 2022-12-18 DIAGNOSIS — I340 Nonrheumatic mitral (valve) insufficiency: Secondary | ICD-10-CM | POA: Diagnosis not present

## 2022-12-18 DIAGNOSIS — M546 Pain in thoracic spine: Secondary | ICD-10-CM | POA: Diagnosis not present

## 2022-12-18 LAB — BASIC METABOLIC PANEL
BUN/Creatinine Ratio: 40 — ABNORMAL HIGH (ref 12–28)
BUN: 47 mg/dL — ABNORMAL HIGH (ref 8–27)
CO2: 33 mmol/L — ABNORMAL HIGH (ref 20–29)
Calcium: 10.4 mg/dL — ABNORMAL HIGH (ref 8.7–10.3)
Chloride: 91 mmol/L — ABNORMAL LOW (ref 96–106)
Creatinine, Ser: 1.17 mg/dL — ABNORMAL HIGH (ref 0.57–1.00)
Glucose: 98 mg/dL (ref 70–99)
Potassium: 3.4 mmol/L — ABNORMAL LOW (ref 3.5–5.2)
Sodium: 134 mmol/L (ref 134–144)
eGFR: 46 mL/min/{1.73_m2} — ABNORMAL LOW (ref >59)

## 2022-12-18 MED ORDER — TORSEMIDE 10 MG PO TABS: 10.0000 mg | ORAL_TABLET | Freq: Every day | ORAL | 0 refills | Status: DC

## 2022-12-18 NOTE — Telephone Encounter (Signed)
-----   Message from Freada Bergeron, MD sent at 12/18/2022  4:01 PM EST ----- Her labs looked like we have dried her out.  How is her swelling? If better: Let's have her skip one day of the torsemide tomorrow and then decrease the torsemide to '10mg'$  daily. We can give her potassium 85mq x1 dose today and then go to 213m daily going forward with a repeat BMET next week to ensure things are getting better.

## 2022-12-18 NOTE — Telephone Encounter (Signed)
The patient has been notified of the result and verbalized understanding.  All questions (if any) were answered.  Pt did confirm with me that her lower extremity edema has improved, and at a minimal at this time.  Pt aware that per Dr. Johney Frame, if her LEE is better, she wants her  skip one day of the torsemide tomorrow and then decrease the torsemide to '10mg'$  daily. We can give her potassium 89mq x1 dose today and then go to 260m daily going forward with a repeat BMET next week to ensure things are getting better.    Pt is aware that she will HOLD Torsemide tomorrow 2/7 ONLY, then she will take decreased torsemide 10 mg po daily starting on Thursday 12/20/22 and thereafter.   Pt is aware that she will take ONE extra dose of KDUR 20 mEq TODAY ONLY (total 40 mEq today only), then she will resume back taking KDUR 20 mEq po daily starting tomorrow 12/19/22 and thereafter.  Pt is aware to come in for repeat BMET next Tuesday 12/25/22 and we will reassess her results at that time and further advise on dosing of each of these medications thereafter.   Scheduled the pt for repeat BMET next Tuesday 12/25/22.   Confirmed the pharmacy of choice with the pt to send decreased torsemide 10 mg po daily to start on 12/20/22.   Spent over 20 mins on the phone with the pt and caregiver giving them new medication changes based on these results.  Pt verbalized understanding and agrees with this plan.  Pt and caregiver read back full medication instructions to me with clear understanding.   Pt verbalized understanding and agrees with this plan.   Off note:  Did call the pts Pharmacy and spoke with the pharmacist about med changes and to counsel the pt on this when she comes to pick her prescription up.  Pharmacist is aware to counsel her to [place] 2/7 and start decreased dose of torsemide 10 mg po daily starting on Thursday 2/8 and thereafter.   Pharmacist noted this and will discuss with the pt when she  picks her script up.

## 2022-12-19 ENCOUNTER — Ambulatory Visit: Payer: Medicare Other | Admitting: Podiatry

## 2022-12-21 DIAGNOSIS — I110 Hypertensive heart disease with heart failure: Secondary | ICD-10-CM | POA: Diagnosis not present

## 2022-12-21 DIAGNOSIS — N3000 Acute cystitis without hematuria: Secondary | ICD-10-CM | POA: Diagnosis not present

## 2022-12-21 DIAGNOSIS — I5032 Chronic diastolic (congestive) heart failure: Secondary | ICD-10-CM | POA: Diagnosis not present

## 2022-12-21 DIAGNOSIS — I480 Paroxysmal atrial fibrillation: Secondary | ICD-10-CM | POA: Diagnosis not present

## 2022-12-21 DIAGNOSIS — K449 Diaphragmatic hernia without obstruction or gangrene: Secondary | ICD-10-CM | POA: Diagnosis not present

## 2022-12-21 DIAGNOSIS — K5900 Constipation, unspecified: Secondary | ICD-10-CM | POA: Diagnosis not present

## 2022-12-25 ENCOUNTER — Ambulatory Visit: Payer: Medicare Other | Attending: Internal Medicine

## 2022-12-25 DIAGNOSIS — R600 Localized edema: Secondary | ICD-10-CM

## 2022-12-25 DIAGNOSIS — E876 Hypokalemia: Secondary | ICD-10-CM | POA: Diagnosis not present

## 2022-12-25 DIAGNOSIS — Z79899 Other long term (current) drug therapy: Secondary | ICD-10-CM | POA: Diagnosis not present

## 2022-12-25 DIAGNOSIS — I5033 Acute on chronic diastolic (congestive) heart failure: Secondary | ICD-10-CM | POA: Diagnosis not present

## 2022-12-26 LAB — BASIC METABOLIC PANEL
BUN/Creatinine Ratio: 43 — ABNORMAL HIGH (ref 12–28)
BUN: 34 mg/dL — ABNORMAL HIGH (ref 8–27)
CO2: 27 mmol/L (ref 20–29)
Calcium: 10.1 mg/dL (ref 8.7–10.3)
Chloride: 95 mmol/L — ABNORMAL LOW (ref 96–106)
Creatinine, Ser: 0.8 mg/dL (ref 0.57–1.00)
Glucose: 99 mg/dL (ref 70–99)
Potassium: 3.9 mmol/L (ref 3.5–5.2)
Sodium: 137 mmol/L (ref 134–144)
eGFR: 73 mL/min/{1.73_m2} (ref >59)

## 2022-12-28 ENCOUNTER — Telehealth: Payer: Self-pay | Admitting: Cardiology

## 2022-12-28 DIAGNOSIS — I480 Paroxysmal atrial fibrillation: Secondary | ICD-10-CM | POA: Diagnosis not present

## 2022-12-28 DIAGNOSIS — K5900 Constipation, unspecified: Secondary | ICD-10-CM | POA: Diagnosis not present

## 2022-12-28 DIAGNOSIS — I5032 Chronic diastolic (congestive) heart failure: Secondary | ICD-10-CM | POA: Diagnosis not present

## 2022-12-28 DIAGNOSIS — I110 Hypertensive heart disease with heart failure: Secondary | ICD-10-CM | POA: Diagnosis not present

## 2022-12-28 DIAGNOSIS — N3000 Acute cystitis without hematuria: Secondary | ICD-10-CM | POA: Diagnosis not present

## 2022-12-28 DIAGNOSIS — K449 Diaphragmatic hernia without obstruction or gangrene: Secondary | ICD-10-CM | POA: Diagnosis not present

## 2022-12-28 NOTE — Telephone Encounter (Signed)
Spoke with Dr. Johney Frame about our Pharmacist recommendations in regards to pts Vesicare and oral potassium supplementation interaction.  Per Dr. Johney Frame she recommends that the pt stay on oral KDUR vs liquid potassium, due to cost and due to pt will not be able to administer the correct dose of liquid potassium if prescribed.  Dr. Johney Frame suggested that [facility] with Home Health reach out to the prescribing MD of her Vesicare (Dr. Junious Silk with [facility]), and have him advise on a safer alternative medication in place of Vesicare for the pt to take with oral potassium supplementation.  Called Nicole with Home Health and endorsed these recommendations to her.  Elmyra Ricks states she will call Dr. Lyndal Rainbow office and have him advise on an alternative regimen in place of Vesicare while pt is on oral potassium.  Elmyra Ricks stated that she will reach back out to as needed, if Dr. Junious Silk doesn't agree to change her Vesicare regimen with her oral potassium supplementation.  Elmyra Ricks verbalized understanding and agrees with this plan.  Elmyra Ricks was gracious for all the assistance provided.

## 2022-12-28 NOTE — Telephone Encounter (Signed)
Vesicare since its an anticholinergic can slow the movement of KCL and can increase the risk of gastric ulcers. Liquid potassium is recommended instead.

## 2022-12-28 NOTE — Telephone Encounter (Signed)
Caller reported a Level II notice came up when she entered the patient's medication.  Caller stated the potassium chloride SA (KLOR-CON M) 20 MEQ tablet interacts with solifenacin (VESICARE) 10 MG tablet.

## 2022-12-28 NOTE — Telephone Encounter (Addendum)
Will seek assistance from PharmD about medication concerns/interaction.  Will follow-up with [name] at [facility] thereafter.   Also left [name] with [facility] a message to call the office back, about this matter.

## 2022-12-31 ENCOUNTER — Other Ambulatory Visit: Payer: Self-pay

## 2023-01-01 ENCOUNTER — Ambulatory Visit: Payer: Medicare Other | Admitting: Neurology

## 2023-01-02 ENCOUNTER — Other Ambulatory Visit: Payer: Self-pay

## 2023-01-02 MED ORDER — LEVOTHYROXINE SODIUM 125 MCG PO TABS: 125.0000 ug | ORAL_TABLET | Freq: Every day | ORAL | 1 refills | Status: AC

## 2023-01-03 DIAGNOSIS — K5900 Constipation, unspecified: Secondary | ICD-10-CM | POA: Diagnosis not present

## 2023-01-03 DIAGNOSIS — K449 Diaphragmatic hernia without obstruction or gangrene: Secondary | ICD-10-CM | POA: Diagnosis not present

## 2023-01-03 DIAGNOSIS — I110 Hypertensive heart disease with heart failure: Secondary | ICD-10-CM | POA: Diagnosis not present

## 2023-01-03 DIAGNOSIS — I5032 Chronic diastolic (congestive) heart failure: Secondary | ICD-10-CM | POA: Diagnosis not present

## 2023-01-03 DIAGNOSIS — I480 Paroxysmal atrial fibrillation: Secondary | ICD-10-CM | POA: Diagnosis not present

## 2023-01-03 DIAGNOSIS — N3000 Acute cystitis without hematuria: Secondary | ICD-10-CM | POA: Diagnosis not present

## 2023-01-03 NOTE — Progress Notes (Signed)
[facility] Clinic Note - Initial Visit   Date: 01/07/2023   [name] MRN: [id number] DOB: 1935/12/16   Dear Dr. Tamala Julian:  Thank you for your kind referral of [name] for consultation of numbness/tingling. Although her history is well known to you, please allow [place] to reiterate it for the purpose of our medical record. The patient was accompanied to the clinic by self.     [name] is a 87 y.o. right-handed female with paroxysmal atrial fibrillation, hypothyroidism, GERD, hyperlipidemia, CHF, hypertension, history of right breast cancer s/p radiation, thoracic aortic aneurysm, valvular heart disease with moderate to severe MR, and chronic low back pain presenting for evaluation of numbness/tinglng.   IMPRESSION/PLAN: Paresthesias involving the arms and legs stemming from cervical and lumbar spondylosis.  Based on the diffuse nature of symptoms and exam showing intact distal sensation, normal reflexes, and distal strength, the likelihood of neuropathy is very low.  I discussed that we could perform NCS/EMG of the legs to help characterize the nature of her symptoms, but the results would likely not change management as care would still be supportive.   With that said, I do not think she would tolerate the test due to positioning and her severe kyphosis, so it was mutually agreed not to proceed with testing.  There is no need for additional neuralgesic medication, as symptoms are well-controlled on current medications.   Lightheadedness could be stemming from cardiac disease and vulvar heart disease.    Gait unsteadiness is secondary to her spine disease and structural malalignment. She is doing home PT.  Fall precautions discussed.   All questions were answered  Return to clinic as needed  ------------------------------------------------------------- History of present illness: Starting around 2021, she began having intermittent  spells of pins and needle sensation all over her body. Over the years, she has felt it more constant involving the lower legs and feet.  She takes hydrocodone for low back pain and takes 1 tablet in the morning and half tablet at bedtime, which tends to resolve her tingling.  There is no associated numbness.  She has good sensation in the hands and feet.  She also complains of lightheadedness and imbalance.  She has fallen once over the past year.  She has been using a walker for the past year.  She is doing home PT.  She lives alone and has caregiver that comes weekdays.     Out-side paper records, electronic medical record, and images have been reviewed where available and summarized as:  MRI cervical spine wo contrast 05/23/2021: 1. Compensatory scoliosis and progressive disc degeneration since 2016. 2. Degenerative foraminal impingement on the right at C4-5 and left at C5-6. 3. C7-T1 ACDF without recurrent stenosis.  MRI thoracic spine wo contrast 05/23/2021: Numerous chronic compression fractures with moderate to advanced height loss, numbered above. Although no marrow edema to suggest acute or subacute fracture, fluid is seen within the fracture clefts of T12 and L1, suggesting motion at these levels. No retropulsion or cord impingement.  MRI lumbar spine wo contrast 06/16/2017: 1. Acute or subacute compression fracture of L4 with mild height loss and no retropulsion. This is likely an insufficiency fracture as at the other levels, but the vertebral body marrow is diffusely abnormal and there is ventral epidural signal abnormality (likely hemorrhage or venous congestion). If vertebroplasty is planned, biopsy is recommended at that time. Otherwise a short follow-up could be obtained. 2. Remote and healed compression fractures  of L1, L2, and L3. 3. Moderate bilateral foraminal narrowing at L3-4.  Lab Results  Component Value Date   HGBA1C  12/01/2010    5.4 (NOTE)                                                                        According to the ADA Clinical Practice Recommendations for 2011, when HbA1c is used as a screening test:   >=6.5%   Diagnostic of Diabetes Mellitus           (if abnormal result  is confirmed)  5.7-6.4%   Increased risk of developing Diabetes Mellitus  References:Diagnosis and Classification of Diabetes Mellitus,Diabetes [id number] 1):S62-S69 and Standards of Medical Care in         Diabetes - 2011,Diabetes Care,2011,34  (Suppl 1):S11-S61.   Lab Results  Component Value Date   [id number] (H) 06/11/2022   Lab Results  Component Value Date   TSH 2.071 11/29/2022   No results found for: "ESRSEDRATE", "POCTSEDRATE"  Past Medical History:  Diagnosis Date   Anxiety    Aortic regurgitation    Breast cancer ([facility]) 08/20/2011   R breast DCIS, ER/PR +   Cataract 3 and 10/92   bilateral   Chronic diastolic CHF (congestive heart failure) (HCC)    Compression fracture of fourth lumbar vertebra (HCC)    DVT (deep venous thrombosis) ([facility]) 08/2011   LL extremity    Fibromyalgia    Fracture lumbar vertebra-closed (HCC)    Fracture of thoracic vertebra, closed (HCC)    GERD (gastroesophageal reflux disease)    H/O hiatal hernia    History of blood clots    History of radiation therapy 01/2012   R breast   Hypercholesteremia    Hypertension    DR Orinda Kenner   Hypothyroidism    Mitral regurgitation    Mitral valve prolapse    Osteoporosis    PAF (paroxysmal atrial fibrillation) ([facility])    a. dx 11/2016.   Pulmonary hypertension ([facility])    Rib fractures    Thoracic ascending aortic aneurysm ([place])    a. followed by Dr. Prescott Gum.   Tricuspid regurgitation     Past Surgical History:  Procedure Laterality Date   ANTERIOR CERVICAL DECOMP/DISCECTOMY FUSION N/A 09/09/2015   Procedure: Anterior Cervical Decompression and Fusion Cervical seven-Thorasic one ;  Surgeon: Erline Levine, MD;  Location: [facility];  Service: Neurosurgery;  Laterality:  N/A;   [place]  10/16/2006   RIH - Dr Hassell Done   KYPHOPLASTY N/A 01/26/2013   Procedure: KYPHOPLASTY;  Surgeon: Kristeen Miss, MD;  Location: [facility];  Service: Neurosurgery;  Laterality: N/A;  T11 and L1   KYPHOPLASTY N/A 06/21/2017   Procedure: Lumbar four Kyphoplasty;  Surgeon: Erline Levine, MD;  Location: [facility];  Service: Neurosurgery;  Laterality: N/A;   MASTECTOMY, PARTIAL  10/17/2011   Procedure: MASTECTOMY PARTIAL;  Surgeon: Haywood Lasso, MD;  Location: [facility];  Service: General;  Laterality: Right;  needle guided   TEE WITHOUT CARDIOVERSION N/A 12/04/2016   Procedure: TRANSESOPHAGEAL ECHOCARDIOGRAM (TEE);  Surgeon: Chrissie Noa  Wells Guiles, MD;  Location: [facility];  Service: Cardiovascular;  Laterality: N/A;   TONSILLECTOMY  1944     Medications:  Outpatient Encounter Medications as of 01/07/2023  Medication Sig Note   acetaminophen (TYLENOL) 325 MG tablet Take 2 tablets (650 mg total) by mouth every 6 (six) hours as needed for moderate pain or mild pain.    amiodarone (PACERONE) 200 MG tablet Take 1 tablet (200 mg total) by mouth daily.    apixaban (ELIQUIS) 2.5 MG TABS tablet Take 1 tablet (2.5 mg total) by mouth 2 (two) times daily.    bisacodyl (DULCOLAX) 5 MG EC tablet Take 1 tablet (5 mg total) by mouth daily as needed for moderate constipation.    calcium citrate-vitamin D (CITRACAL+D) 315-200 MG-UNIT per tablet Take 1 tablet by mouth 2 (two) times daily. (Patient taking differently: Take 2 tablets by mouth daily.)    cholecalciferol (VITAMIN D) 1000 UNITS tablet Take 2,000 Units by mouth every evening. Vitamin D3    diltiazem (CARDIZEM CD) 180 MG 24 hr capsule Take 1 capsule by mouth daily    estradiol (ESTRACE) 0.1 MG/GM vaginal cream Place 1 Applicatorful vaginally once a week.    HYDROcodone-acetaminophen (NORCO/VICODIN) 5-325 MG tablet Take 1-2 tablets by mouth every 6  (six) hours as needed (mild pain).    iron polysaccharides (NIFEREX) 150 MG capsule Take 1 capsule (150 mg total) by mouth 3 (three) times a week. M, W, F    levothyroxine (SYNTHROID) 125 MCG tablet Take 1 tablet (125 mcg total) by mouth daily before breakfast.    Meclizine HCl (BONINE PO) Take 12.5 mg by mouth daily as needed (dizziness).    metoprolol tartrate (LOPRESSOR) 25 MG tablet Take 1 tablet (25 mg total) by mouth 2 (two) times daily.    OVER THE COUNTER MEDICATION Take 1,000 mg by mouth daily. D-Mannose 500 mg    pantoprazole (PROTONIX) 40 MG tablet Take 1 tablet (40 mg total) by mouth daily.    potassium chloride SA (KLOR-CON M) 20 MEQ tablet Take 1 tablet (20 mEq total) by mouth twice daily for 4 days only, then decrease to taking 1 tablet (20 mEq total) by mouth daily thereafter. 12/18/2022: Patient will take one extra dose of KDUR 20 mEq today only (total 40 mEq today 2/6), then resume 20 mEq by mouth daily thereafter per Dr. Johney Frame. Repeat bmet on 12/25/22.   pravastatin (PRAVACHOL) 20 MG tablet Take 1 tablet (20 mg total) by mouth daily with supper.    risedronate (ACTONEL) 150 MG tablet Take 150 mg by mouth every 30 (thirty) days. with water on empty stomach, nothing by mouth or lie down for next 30 minutes.    senna-docusate (SENOKOT-S) 8.6-50 MG tablet Take 1 tablet by mouth 2 (two) times daily.    solifenacin (VESICARE) 10 MG tablet Take 1 tablet (10 mg total) by mouth daily. (Patient taking differently: Take 5 mg by mouth daily.)    torsemide (DEMADEX) 10 MG tablet Take 1 tablet (10 mg total) by mouth daily. No torsemide on 12/19/22 and start taking this on Thursday 12/20/22.    Ascorbic Acid (VITA-C PO) Take 1,000 mg by mouth daily. (Patient not taking: Reported on 01/07/2023)    diclofenac Sodium (VOLTAREN) 1 % GEL Apply 2 g topically 3 (three) times daily as needed (join pains). (Patient not taking: Reported on 01/07/2023)    Multiple Vitamin (MULTIVITAMIN) tablet Take 1 tablet by  mouth daily with breakfast. (Patient not taking: Reported on 01/07/2023)  sucralfate (CARAFATE) 1 g tablet Take 1 tablet (1 g total) by mouth 4 (four) times daily -  with meals and at bedtime. Tablets may be dissolved in water before administration.    No facility-administered encounter medications on file as of 01/07/2023.    Allergies:  Allergies  Allergen Reactions   Contrast Media [Iodinated Contrast Media] Shortness Of Breath   Iohexol Shortness Of Breath   Mucinex [Guaifenesin Er] Other (See Comments)    DIZZINESS   Zanaflex [Tizanidine Hcl] Anxiety and Other (See Comments)    Dizziness   Penicillins Rash    Has patient had a PCN reaction causing immediate rash, facial/tongue/throat swelling, SOB or lightheadedness with hypotension: No Has patient had a PCN reaction causing severe rash involving mucus membranes or skin necrosis: No Has patient had a PCN reaction that required hospitalization No Has patient had a PCN reaction occurring within the last 10 years: No If all of the above answers are "NO", then may proceed with Cephalosporin use.    Robaxin [Methocarbamol] Diarrhea    Family History: Family History  Problem Relation Age of Onset   Heart disease Mother    Heart disease Maternal Uncle    Heart disease Maternal Grandfather     Social History: Social History   Tobacco Use   Smoking status: Never   Smokeless tobacco: Never  Vaping Use   Vaping Use: Never used  Substance Use Topics   Alcohol use: No   Drug use: No   Social History   Social History Narrative   Are you right handed or left handed? Right   Are you currently employed ?    What is your current occupation? retired   Do you live at home alone? Yes but has a care giver,5 days a week   Who lives with you?    What type of home do you live in: 1 story or 2 story? one    Caffeine 1 soda aday    Vital Signs:  BP 116/68   Pulse (!) 48   Ht '4\' 11"'$  (1.499 m)   Wt 100 lb (45.4 kg)   SpO2 100%    BMI 20.20 kg/m   Neurological Exam: MENTAL STATUS including orientation to time, place, person, recent and remote memory, attention span and concentration, language, and fund of knowledge is normal.  Speech is not dysarthric.  CRANIAL NERVES: II:  No visual field defects.     III-IV-VI: Pupils equal round and reactive to light.  Left ophthalmoplegia (congenital) , right eye extra-ocular eye movements in all directions of gaze.  No nystagmus.  No ptosis.   V:  Normal facial sensation.    VII:  Normal facial symmetry and movements.   VIII:  Normal hearing and vestibular function.   IX-X:  Normal palatal movement.   XI:  Normal shoulder shrug and head rotation.   XII:  Normal tongue strength and range of motion, no deviation or fasciculation.  MOTOR: Severe kyphosis and scoliosis.  Generalized loss of muscle bulk throughout with severe left ABP atrophy.  No fasciculations or abnormal movements.  No pronator drift.  Motor strength is 5-/5 throughout, including distally.   Tone is normal.   MSRs:                                           Right  Left brachioradialis 2+  2+  biceps 2+  2+  triceps 2+  2+  patellar 2+  2+  ankle jerk 2+  2+  Hoffman no  no  plantar response down  down   SENSORY:  Normal and symmetric perception of light touch, pinprick, vibration, and proprioception.    COORDINATION/GAIT: Normal finger-to- nose-finger.  Intact rapid alternating movements bilaterally.   Gait is stooped, assisted with rollator, stable.  Total time spent reviewing records, interview, history/exam, documentation, and coordination of care on day of encounter:  45 min   Thank you for allowing me to participate in patient's care.  If I can answer any additional questions, I would be pleased to do so.    Sincerely,    Cardelia Sassano K. Posey Pronto, DO

## 2023-01-04 ENCOUNTER — Other Ambulatory Visit (HOSPITAL_COMMUNITY): Payer: Self-pay

## 2023-01-05 DIAGNOSIS — G8929 Other chronic pain: Secondary | ICD-10-CM | POA: Diagnosis not present

## 2023-01-05 DIAGNOSIS — K5900 Constipation, unspecified: Secondary | ICD-10-CM | POA: Diagnosis not present

## 2023-01-05 DIAGNOSIS — I480 Paroxysmal atrial fibrillation: Secondary | ICD-10-CM | POA: Diagnosis not present

## 2023-01-05 DIAGNOSIS — I5032 Chronic diastolic (congestive) heart failure: Secondary | ICD-10-CM | POA: Diagnosis not present

## 2023-01-05 DIAGNOSIS — K449 Diaphragmatic hernia without obstruction or gangrene: Secondary | ICD-10-CM | POA: Diagnosis not present

## 2023-01-05 DIAGNOSIS — N3000 Acute cystitis without hematuria: Secondary | ICD-10-CM | POA: Diagnosis not present

## 2023-01-05 DIAGNOSIS — E876 Hypokalemia: Secondary | ICD-10-CM | POA: Diagnosis not present

## 2023-01-05 DIAGNOSIS — Z7901 Long term (current) use of anticoagulants: Secondary | ICD-10-CM | POA: Diagnosis not present

## 2023-01-05 DIAGNOSIS — Z86718 Personal history of other venous thrombosis and embolism: Secondary | ICD-10-CM | POA: Diagnosis not present

## 2023-01-05 DIAGNOSIS — E440 Moderate protein-calorie malnutrition: Secondary | ICD-10-CM | POA: Diagnosis not present

## 2023-01-05 DIAGNOSIS — I081 Rheumatic disorders of both mitral and tricuspid valves: Secondary | ICD-10-CM | POA: Diagnosis not present

## 2023-01-05 DIAGNOSIS — Z9181 History of falling: Secondary | ICD-10-CM | POA: Diagnosis not present

## 2023-01-05 DIAGNOSIS — Z604 Social exclusion and rejection: Secondary | ICD-10-CM | POA: Diagnosis not present

## 2023-01-05 DIAGNOSIS — H269 Unspecified cataract: Secondary | ICD-10-CM | POA: Diagnosis not present

## 2023-01-05 DIAGNOSIS — M810 Age-related osteoporosis without current pathological fracture: Secondary | ICD-10-CM | POA: Diagnosis not present

## 2023-01-05 DIAGNOSIS — M545 Low back pain, unspecified: Secondary | ICD-10-CM | POA: Diagnosis not present

## 2023-01-05 DIAGNOSIS — E871 Hypo-osmolality and hyponatremia: Secondary | ICD-10-CM | POA: Diagnosis not present

## 2023-01-05 DIAGNOSIS — M419 Scoliosis, unspecified: Secondary | ICD-10-CM | POA: Diagnosis not present

## 2023-01-05 DIAGNOSIS — I712 Thoracic aortic aneurysm, without rupture, unspecified: Secondary | ICD-10-CM | POA: Diagnosis not present

## 2023-01-05 DIAGNOSIS — M5010 Cervical disc disorder with radiculopathy, unspecified cervical region: Secondary | ICD-10-CM | POA: Diagnosis not present

## 2023-01-05 DIAGNOSIS — I272 Pulmonary hypertension, unspecified: Secondary | ICD-10-CM | POA: Diagnosis not present

## 2023-01-05 DIAGNOSIS — I700 Atherosclerosis of aorta: Secondary | ICD-10-CM | POA: Diagnosis not present

## 2023-01-05 DIAGNOSIS — E039 Hypothyroidism, unspecified: Secondary | ICD-10-CM | POA: Diagnosis not present

## 2023-01-05 DIAGNOSIS — I110 Hypertensive heart disease with heart failure: Secondary | ICD-10-CM | POA: Diagnosis not present

## 2023-01-07 ENCOUNTER — Ambulatory Visit (INDEPENDENT_AMBULATORY_CARE_PROVIDER_SITE_OTHER): Payer: Medicare Other | Admitting: Neurology

## 2023-01-07 ENCOUNTER — Encounter: Payer: Self-pay | Admitting: Neurology

## 2023-01-07 VITALS — BP 116/68 | HR 48 | Ht 59.0 in | Wt 100.0 lb

## 2023-01-07 DIAGNOSIS — M47816 Spondylosis without myelopathy or radiculopathy, lumbar region: Secondary | ICD-10-CM | POA: Diagnosis not present

## 2023-01-07 DIAGNOSIS — M5412 Radiculopathy, cervical region: Secondary | ICD-10-CM | POA: Diagnosis not present

## 2023-01-07 DIAGNOSIS — R202 Paresthesia of skin: Secondary | ICD-10-CM | POA: Diagnosis not present

## 2023-01-08 NOTE — Progress Notes (Unsigned)
Office Visit    Patient Name: [name] Date of Encounter: 01/08/2023  Primary Care Provider:  Corliss Blacker, MD Primary Cardiologist:  Freada Bergeron, MD Primary Electrophysiologist: None  Chief Complaint    [name] is a 87 y.o. female with PMH of history of osteoporosis, hyperlipidemia, hypertension, hypothyroidism, breast Ca (lumpectopy/ XRT 2012-3), DVT in 2012, GERD, 4.3cm ascending thoracic aneurysm (previously followed by Dr. Prescott Gum not a candidate for repair due to severe kyphosis), anxiety, paroxysmal atrial fib, chronic diastolic CHF, pulm HTN, AI/MR/TR who presents today for 4-week follow-up of lower extremity edema and CHF.  Past Medical History    Past Medical History:  Diagnosis Date   Anxiety    Aortic regurgitation    Breast cancer ([place]) 08/20/2011   R breast DCIS, ER/PR +   Cataract 3 and 10/92   bilateral   Chronic diastolic CHF (congestive heart failure) (HCC)    Compression fracture of fourth lumbar vertebra (HCC)    DVT (deep venous thrombosis) ([place]) 08/2011   LL extremity    Fibromyalgia    Fracture lumbar vertebra-closed (HCC)    Fracture of thoracic vertebra, closed (HCC)    GERD (gastroesophageal reflux disease)    H/O hiatal hernia    History of blood clots    History of radiation therapy 01/2012   R breast   Hypercholesteremia    Hypertension    DR Orinda Kenner   Hypothyroidism    Mitral regurgitation    Mitral valve prolapse    Osteoporosis    PAF (paroxysmal atrial fibrillation) ([place])    a. dx 11/2016.   Pulmonary hypertension ([place])    Rib fractures    Thoracic ascending aortic aneurysm ([place])    a. followed by Dr. Prescott Gum.   Tricuspid regurgitation    Past Surgical History:  Procedure Laterality Date   ANTERIOR CERVICAL DECOMP/DISCECTOMY FUSION N/A 09/09/2015   Procedure: Anterior Cervical Decompression and Fusion Cervical seven-Thorasic one ;  Surgeon: Erline Levine, MD;  Location: [facility];   Service: Neurosurgery;  Laterality: N/A;   [place]  10/16/2006   RIH - Dr Hassell Done   KYPHOPLASTY N/A 01/26/2013   Procedure: KYPHOPLASTY;  Surgeon: Kristeen Miss, MD;  Location: [facility];  Service: Neurosurgery;  Laterality: N/A;  T11 and L1   KYPHOPLASTY N/A 06/21/2017   Procedure: Lumbar four Kyphoplasty;  Surgeon: Erline Levine, MD;  Location: [facility];  Service: Neurosurgery;  Laterality: N/A;   MASTECTOMY, PARTIAL  10/17/2011   Procedure: MASTECTOMY PARTIAL;  Surgeon: Haywood Lasso, MD;  Location: [facility];  Service: General;  Laterality: Right;  needle guided   TEE WITHOUT CARDIOVERSION N/A 12/04/2016   Procedure: TRANSESOPHAGEAL ECHOCARDIOGRAM (TEE);  Surgeon: Pixie Casino, MD;  [facility];  Service: Cardiovascular;  Laterality: N/A;   TONSILLECTOMY  1944   Allergies  Allergies  Allergen Reactions   Contrast Media [Iodinated Contrast Media] Shortness Of Breath   Iohexol Shortness Of Breath   Mucinex [Guaifenesin Er] Other (See Comments)    DIZZINESS   Zanaflex [Tizanidine Hcl] Anxiety and Other (See Comments)    Dizziness   Penicillins Rash    Has patient had a PCN reaction causing immediate rash, facial/tongue/throat swelling, SOB or lightheadedness with hypotension: No Has patient had a PCN reaction causing severe rash involving  mucus membranes or skin necrosis: No Has patient had a PCN reaction that required hospitalization No Has patient had a PCN reaction occurring within the last 10 years: No If all of the above answers are "NO", then may proceed with Cephalosporin use.    Robaxin [Methocarbamol] Diarrhea    History of Present Illness    [name]  is a 87 year old female with the above mention past medical history who presents today for 4-week follow-up of lower extremity swelling.  She was previously followed by Dr. Meda Coffee and establish care  with Dr. Johney Frame in 2023.  She was initially seen by Dr. Meda Coffee in 2018 due to new onset atrial fibrillation.  She had a previous CT of the chest that showed mild atherosclerotic CAD.  She was started on Eliquis and treated with Cardizem drip.   2D echo was completed that showed EF of 55-60% with severe MR. She was seen by EP for consideration of antiarrhythmic therapy and patient was set up for DCCV with TEE for further evaluation EMR that revealed moderate MR with flail P2 segment and EF of 60 to 65%.  She was found not to have indication for surgery at that time.  She was last seen by Dr. Burt Knack on 06/05/2021 where she was deemed best for medical management given her lack of symptoms and comorbidities.  Repeat TTE showed stable moderate to severe MR.  She was seen by Dr. Johney Frame 11/23/2021 and endorsed some lightheadedness at rest and with position changes.  She also noted paresthesia in the hands and feet and was started on magnesium supplement.  She was recently admitted 11/2022 with abdominal pain and acute on chronic CHF.  Patient was diary and was feeling well and abdominal pain felt to be related to hiatal hernia.  She also developed intermittent AF with RVR and patient was unable to tolerate titrations of metoprolol and Cardizem due to low blood pressure.  She was placed on amiodarone p.o. with resumption of Eliquis.  She was seen in follow-up 12/12/2022 by Dr. Johney Frame for posthospital follow-up.  During visit patient was hesitant to start new medications and did not begin therapy until discussing further.  She also reported worsening lower extremity edema with compliance of Lasix 20 mg daily.  She continued to endorse lightheadedness and struggles with her balance.  She was changed to torsemide 20 mg with potassium added daily.  Losartan was discontinued due to soft blood pressures.  She was started on amiodarone 200 mg daily and continued on Cardizem 180 mg daily, metoprolol 25 mg twice daily, and  apixaban 5 mg twice daily.  Patient deemed not a surgical candidate for valve replacement and continued medical management.  Since last being seen in the office patient reports***.  Patient denies chest pain, palpitations, dyspnea, PND, orthopnea, nausea, vomiting, dizziness, syncope, edema, weight gain, or early satiety.   ***Notes:  Home Medications    Current Outpatient Medications  Medication Sig Dispense Refill   acetaminophen (TYLENOL) 325 MG tablet Take 2 tablets (650 mg total) by mouth every 6 (six) hours as needed for moderate pain or mild pain.     amiodarone (PACERONE) 200 MG tablet Take 1 tablet (200 mg total) by mouth daily. 90 tablet 3   apixaban (ELIQUIS) 2.5 MG TABS tablet Take 1 tablet (2.5 mg total) by mouth 2 (two) times daily. 60 tablet 1   Ascorbic Acid (VITA-C PO) Take 1,000 mg by mouth daily. (Patient not taking: Reported on 01/07/2023)  bisacodyl (DULCOLAX) 5 MG EC tablet Take 1 tablet (5 mg total) by mouth daily as needed for moderate constipation. 30 tablet 1   calcium citrate-vitamin D (CITRACAL+D) 315-200 MG-UNIT per tablet Take 1 tablet by mouth 2 (two) times daily. (Patient taking differently: Take 2 tablets by mouth daily.) 60 tablet 11   cholecalciferol (VITAMIN D) 1000 UNITS tablet Take 2,000 Units by mouth every evening. Vitamin D3     diclofenac Sodium (VOLTAREN) 1 % GEL Apply 2 g topically 3 (three) times daily as needed (join pains). (Patient not taking: Reported on 01/07/2023) 50 g 0   diltiazem (CARDIZEM CD) 180 MG 24 hr capsule Take 1 capsule by mouth daily 30 capsule 1   estradiol (ESTRACE) 0.1 MG/GM vaginal cream Place 1 Applicatorful vaginally once a week.     HYDROcodone-acetaminophen (NORCO/VICODIN) 5-325 MG tablet Take 1-2 tablets by mouth every 6 (six) hours as needed (mild pain). 20 tablet 0   iron polysaccharides (NIFEREX) 150 MG capsule Take 1 capsule (150 mg total) by mouth 3 (three) times a week. M, W, F 30 capsule 0   levothyroxine  (SYNTHROID) 125 MCG tablet Take 1 tablet (125 mcg total) by mouth daily before breakfast. 30 tablet 1   Meclizine HCl (BONINE PO) Take 12.5 mg by mouth daily as needed (dizziness).     metoprolol tartrate (LOPRESSOR) 25 MG tablet Take 1 tablet (25 mg total) by mouth 2 (two) times daily. 180 tablet 1   Multiple Vitamin (MULTIVITAMIN) tablet Take 1 tablet by mouth daily with breakfast. (Patient not taking: Reported on 01/07/2023)     OVER THE COUNTER MEDICATION Take 1,000 mg by mouth daily. D-Mannose 500 mg     pantoprazole (PROTONIX) 40 MG tablet Take 1 tablet (40 mg total) by mouth daily. 30 tablet 6   potassium chloride SA (KLOR-CON M) 20 MEQ tablet Take 1 tablet (20 mEq total) by mouth twice daily for 4 days only, then decrease to taking 1 tablet (20 mEq total) by mouth daily thereafter. 40 tablet 0   pravastatin (PRAVACHOL) 20 MG tablet Take 1 tablet (20 mg total) by mouth daily with supper. 30 tablet 1   risedronate (ACTONEL) 150 MG tablet Take 150 mg by mouth every 30 (thirty) days. with water on empty stomach, nothing by mouth or lie down for next 30 minutes.     senna-docusate (SENOKOT-S) 8.6-50 MG tablet Take 1 tablet by mouth 2 (two) times daily. 60 tablet 1   solifenacin (VESICARE) 10 MG tablet Take 1 tablet (10 mg total) by mouth daily. (Patient taking differently: Take 5 mg by mouth daily.) 30 tablet 1   sucralfate (CARAFATE) 1 g tablet Take 1 tablet (1 g total) by mouth 4 (four) times daily -  with meals and at bedtime. Tablets may be dissolved in water before administration. 120 tablet 0   torsemide (DEMADEX) 10 MG tablet Take 1 tablet (10 mg total) by mouth daily. No torsemide on 12/19/22 and start taking this on Thursday 12/20/22. 30 tablet 0   No current facility-administered medications for this visit.     Review of Systems  Please see the history of present illness.    (+)*** (+)***  All other systems reviewed and are otherwise negative except as noted above.  Physical Exam     Wt Readings from Last 3 Encounters:  01/07/23 100 lb (45.4 kg)  12/12/22 104 lb 3.2 oz (47.3 kg)  12/03/22 103 lb 13.4 oz (47.1 kg)   [id number] were no vitals filed  for this visit.,There is no height or weight on file to calculate BMI.  Constitutional:      Appearance: Healthy appearance. Not in distress.  Neck:     Vascular: JVD normal.  Pulmonary:     Effort: Pulmonary effort is normal.     Breath sounds: No wheezing. No rales. Diminished in the bases Cardiovascular:     Normal rate. Regular rhythm. Normal S1. Normal S2.      Murmurs: There is no murmur.  Edema:    Peripheral edema absent.  Abdominal:     Palpations: Abdomen is soft non tender. There is no hepatomegaly.  Skin:    General: Skin is warm and dry.  Neurological:     General: No focal deficit present.     Mental Status: Alert and oriented to person, place and time.     Cranial Nerves: Cranial nerves are intact.  EKG/LABS/Other Studies Reviewed    ECG personally reviewed by me today - ***  Risk Assessment/Calculations:   {Does this patient have ATRIAL FIBRILLATION?:[id number]}        Lab Results  Component Value Date   WBC 10.0 11/29/2022   HGB 14.3 11/29/2022   HCT 39.8 11/29/2022   MCV 85.6 11/29/2022   PLT 235 11/29/2022   Lab Results  Component Value Date   CREATININE 0.80 12/25/2022   BUN 34 (H) 12/25/2022   NA 137 12/25/2022   K 3.9 12/25/2022   CL 95 (L) 12/25/2022   CO2 27 12/25/2022   Lab Results  Component Value Date   ALT 24 11/29/2022   AST 26 11/29/2022   ALKPHOS 60 11/29/2022   BILITOT 0.7 11/29/2022   Lab Results  Component Value Date   CHOL  12/02/2010    149        ATP III CLASSIFICATION:  <200     mg/dL   Desirable  200-239  mg/dL   Borderline High  >=240    mg/dL   High          HDL 58 12/02/2010   LDLCALC  12/02/2010    80        Total Cholesterol/HDL:CHD Risk Coronary Heart Disease Risk Table                     Men   Women  1/2 Average Risk   3.4   3.3   Average Risk       5.0   4.4  2 X Average Risk   9.6   7.1  3 X Average Risk  23.4   11.0        Use the calculated Patient Ratio above and the CHD Risk Table to determine the patient's CHD Risk.        ATP III CLASSIFICATION (LDL):  <100     mg/dL   Optimal  100-129  mg/dL   Near or Above                    Optimal  130-159  mg/dL   Borderline  160-189  mg/dL   High  >190     mg/dL   Very High   TRIG 54 12/02/2010   CHOLHDL 2.6 12/02/2010    Lab Results  Component Value Date   HGBA1C  12/01/2010    5.4 (NOTE)  According to the ADA Clinical Practice Recommendations for 2011, when HbA1c is used as a screening test:   >=6.5%   Diagnostic of Diabetes Mellitus           (if abnormal result  is confirmed)  5.7-6.4%   Increased risk of developing Diabetes Mellitus  References:Diagnosis and Classification of Diabetes Mellitus,Diabetes [id number] 1):S62-S69 and Standards of Medical Care in         Diabetes - 2011,Diabetes [id number]  (Suppl 1):S11-S61.    Assessment & Plan    1.  Chronic diastolic CHF: -Most recent 2D echo completed 05/2022 with EF of 60 to 65%, severe RV enlargement with preserved RV function, severe MR with mild to moderate AR and severe aortic dilation of 50 mm, severe TR and patient not a surgical candidate due to severe scoliosis. -Patient was seen 4 weeks ago and was volume overloaded with NYHA class III symptoms.  Today patient reports*** -  2.  Paroxysmal atrial fibrillation: -Patient recently admitted with AF with RVR and placed on amiodarone.  Today patient reports*** -Continue***  3.  Moderate to severe mitral regurgitation: -TTE 05/2022 with EF 60-65%, severe RV enlargement with preserved function, PASP 74, severe MR, mild to moderate AR, severe aortic dilation 52m, severe TR. Not a surgical candidate. On medical therapy.    4.  Thoracic aortic aneurysm: -Patient was  previously followed by Dr. VDarcey Noraand currently managed conservatively due to not being a surgical candidate.      Disposition: Follow-up with HFreada Bergeron MD or APP in *** months {Are you ordering a CV Procedure (e.g. stress test, cath, DCCV, TEE, etc)?   Press F2        :[id number]  Medication Adjustments/Labs and Tests Ordered: Current medicines are reviewed at length with the patient today.  Concerns regarding medicines are outlined above.   Signed, DMable Fill EMarissa Nestle NP 01/08/2023, 11:13 AM [facility]  Note:  This document was prepared using Dragon voice recognition software and may include unintentional dictation errors.

## 2023-01-09 ENCOUNTER — Encounter: Payer: Self-pay | Admitting: Nurse Practitioner

## 2023-01-09 ENCOUNTER — Ambulatory Visit: Payer: Medicare Other | Attending: Nurse Practitioner | Admitting: Nurse Practitioner

## 2023-01-09 VITALS — BP 108/64 | HR 50 | Ht 59.0 in | Wt 98.8 lb

## 2023-01-09 DIAGNOSIS — I5033 Acute on chronic diastolic (congestive) heart failure: Secondary | ICD-10-CM

## 2023-01-09 DIAGNOSIS — I480 Paroxysmal atrial fibrillation: Secondary | ICD-10-CM | POA: Diagnosis not present

## 2023-01-09 DIAGNOSIS — G8929 Other chronic pain: Secondary | ICD-10-CM

## 2023-01-09 DIAGNOSIS — I340 Nonrheumatic mitral (valve) insufficiency: Secondary | ICD-10-CM

## 2023-01-09 DIAGNOSIS — I5032 Chronic diastolic (congestive) heart failure: Secondary | ICD-10-CM

## 2023-01-09 DIAGNOSIS — I10 Essential (primary) hypertension: Secondary | ICD-10-CM

## 2023-01-09 DIAGNOSIS — Z79899 Other long term (current) drug therapy: Secondary | ICD-10-CM | POA: Diagnosis not present

## 2023-01-09 DIAGNOSIS — M546 Pain in thoracic spine: Secondary | ICD-10-CM | POA: Insufficient documentation

## 2023-01-09 MED ORDER — METOPROLOL TARTRATE 25 MG PO TABS: 25.0000 mg | ORAL_TABLET | Freq: Every day | ORAL | 1 refills | Status: DC

## 2023-01-09 NOTE — Patient Instructions (Signed)
Medication Instructions:  DECREASE Lopressor to '25mg'$  Take 1 tablet once a day  *If you need a refill on your cardiac medications before your next appointment, please call your pharmacy*   Lab Work: TODAY-CMET, MAG, &TSH If you have labs (blod work) drawn today and your tests are completely normal, you will receive your results only by: [place] (if you have MyChart) OR A paper copy in the mail If you have any lab test that is abnormal or we need to change your treatment, we will call you to review the results.   Testing/Procedures: NONE    Follow-Up: At [facility], you and your health needs are our priority.  As part of our continuing mission to provide you with exceptional heart care, we have created designated Provider Care Teams.  These Care Teams include your primary Cardiologist (physician) and Advanced Practice Providers (APPs -  Physician Assistants and Nurse Practitioners) who all work together to provide you with the care you need, when you need it.  We recommend signing up for the patient portal called "MyChart".  Sign up information is provided on this After Visit Summary.  MyChart is used to connect with patients for Virtual Visits (Telemedicine).  Patients are able to view lab/test results, encounter notes, upcoming appointments, etc.  Non-urgent messages can be sent to your provider as well.   To learn more about what you can do with MyChart, go to [email or web address].    Your next appointment:   1 month(s)  Provider:   Ambrose Pancoast, NP       Other Instructions CHECK YOUR BLOOD PRESSURE DAILY AND CONTACT THE OFFICE IF YOUR READINGS ARE 95 OR LESS FOR THE TOP NUMBER

## 2023-01-10 ENCOUNTER — Telehealth: Payer: Self-pay | Admitting: Nurse Practitioner

## 2023-01-10 ENCOUNTER — Telehealth: Payer: Self-pay | Admitting: Cardiology

## 2023-01-10 DIAGNOSIS — I5032 Chronic diastolic (congestive) heart failure: Secondary | ICD-10-CM

## 2023-01-10 DIAGNOSIS — E876 Hypokalemia: Secondary | ICD-10-CM

## 2023-01-10 DIAGNOSIS — I480 Paroxysmal atrial fibrillation: Secondary | ICD-10-CM

## 2023-01-10 DIAGNOSIS — I10 Essential (primary) hypertension: Secondary | ICD-10-CM

## 2023-01-10 LAB — COMPREHENSIVE METABOLIC PANEL
ALT: 21 IU/L (ref 0–32)
AST: 24 IU/L (ref 0–40)
Albumin/Globulin Ratio: 2 (ref 1.2–2.2)
Albumin: 4.2 g/dL (ref 3.7–4.7)
Alkaline Phosphatase: 77 IU/L (ref 44–121)
BUN/Creatinine Ratio: 38 — ABNORMAL HIGH (ref 12–28)
BUN: 38 mg/dL — ABNORMAL HIGH (ref 8–27)
Bilirubin Total: 0.4 mg/dL (ref 0.0–1.2)
CO2: 26 mmol/L (ref 20–29)
Calcium: 9.7 mg/dL (ref 8.7–10.3)
Chloride: 96 mmol/L (ref 96–106)
Creatinine, Ser: 1.01 mg/dL — ABNORMAL HIGH (ref 0.57–1.00)
Globulin, Total: 2.1 g/dL (ref 1.5–4.5)
Glucose: 90 mg/dL (ref 70–99)
Potassium: 4.1 mmol/L (ref 3.5–5.2)
Sodium: 137 mmol/L (ref 134–144)
Total Protein: 6.3 g/dL (ref 6.0–8.5)
eGFR: 55 mL/min/{1.73_m2} — ABNORMAL LOW (ref >59)

## 2023-01-10 LAB — MAGNESIUM: Magnesium: 2.1 mg/dL (ref 1.6–2.3)

## 2023-01-10 LAB — TSH: TSH: 2.02 u[IU]/mL (ref 0.450–4.500)

## 2023-01-10 MED ORDER — POTASSIUM CHLORIDE CRYS ER 20 MEQ PO TBCR: 20.0000 meq | EXTENDED_RELEASE_TABLET | ORAL | 3 refills | Status: DC

## 2023-01-10 MED ORDER — TORSEMIDE 10 MG PO TABS: 10.0000 mg | ORAL_TABLET | ORAL | 3 refills | Status: DC

## 2023-01-10 NOTE — Telephone Encounter (Signed)
Called patient to give lab results and recommendations. Patient verbalizes understanding renal function is slightly elevated with normal magnesium and potassium and liver function and thyroid function are both normal with the addition of amiodarone.  Advised [name] to change her potassium and torsemide to Monday, Wednesday, Friday and to continue to monitor weights and contact our office if swelling increases with your new diuretic regimen.  Patient verbalizes understanding and requests refills of torsemide and potassium. Orders placed.

## 2023-01-10 NOTE — Telephone Encounter (Signed)
Pt c/o medication issue:  1. Name of Medication: apixaban (ELIQUIS) 2.5 MG TABS tablet   2. How are you currently taking this medication (dosage and times per day)?   3. Are you having a reaction (difficulty breathing--STAT)?   4. What is your medication issue?  Pt states her [facility]- told her that she needs to contact Dr. Johney Frame for a coupon for this medication. Please advise.

## 2023-01-10 NOTE — Telephone Encounter (Signed)
Patient is calling for her lab results.

## 2023-01-10 NOTE — Telephone Encounter (Signed)
**Note De-Identified  Obfuscation** The pt is advised that per our refill dept, they s/w [facility] this morning and was advised that they cannot accept the information off of a $10 co-pay card for Eliquis because there is a breech in the system and it is not available at this time and they had no estimate on how long it will take to correct the Breech.  I offered her 2 weeks of Eliquis samples in hopes that the breech will be handled by then but the pt stated that she has no way to get here and no one to pick up the samples for her from our office.  She states that for now she can pay the $47 for a 30 day supply of Eliquis and that [facility] is delivering it to her.  She is aware to either contact us when the breech has been taken care of so we can call [facility] with the information off of a $10 co-pay Eliquis card or she can request that [facility] reach out to [place].  I also gave her the phone number to reach out to Eliquis customer connect concerning activating a card but she declined to call them stating that she would rather we handle. She thanked me for calling her back.

## 2023-01-11 ENCOUNTER — Telehealth: Payer: Self-pay | Admitting: Cardiology

## 2023-01-11 NOTE — Telephone Encounter (Signed)
Returned call to patient, discussed lab results.  Per Jaquelyn Bitter: Please let Ms. Emmert know her renal function is slightly elevated with normal magnesium and potassium.  Your liver function and thyroid function are both normal with the addition of amiodarone.  Please advise Ms. Pitney to change her potassium and torsemide to Monday, Wednesday, Friday.  Please continue to monitor your weights and contact our office if your swelling increases with your new diuretic regimen.  Please let me know if you have any additional questions.   Patient verbalized understanding and expressed appreciation for follow-up.  Med list updated.

## 2023-01-11 NOTE — Telephone Encounter (Signed)
Pt is returning call to follow up about lab results

## 2023-01-16 ENCOUNTER — Encounter: Payer: Self-pay | Admitting: Podiatry

## 2023-01-16 ENCOUNTER — Ambulatory Visit (INDEPENDENT_AMBULATORY_CARE_PROVIDER_SITE_OTHER): Payer: Medicare Other | Admitting: Podiatry

## 2023-01-16 DIAGNOSIS — M79675 Pain in left toe(s): Secondary | ICD-10-CM

## 2023-01-16 DIAGNOSIS — B351 Tinea unguium: Secondary | ICD-10-CM

## 2023-01-16 DIAGNOSIS — M79674 Pain in right toe(s): Secondary | ICD-10-CM

## 2023-01-16 DIAGNOSIS — D689 Coagulation defect, unspecified: Secondary | ICD-10-CM | POA: Diagnosis not present

## 2023-01-16 NOTE — Progress Notes (Addendum)
This patient presents to the office with chief complaint of long thick painful nails.  Patient says the nails are painful walking and wearing shoes.  This patient is unable to self treat.  This patient is unable to trim her nails since she is unable to reach her nails.  She presents for at risk care since she has coagulation defect due to taking eliquis.  She presents to the office for preventative foot care services.  General Appearance  Alert, conversant and in no acute stress.  Vascular  Dorsalis pedis and posterior tibial  pulses are  weaklypalpable  bilaterally.  Capillary return is within normal limits  bilaterally. Temperature is within normal limits  bilaterally.  Neurologic  Senn-Weinstein monofilament wire test within normal limits  bilaterally. Muscle power within normal limits bilaterally.  Nails Thick disfigured discolored nails with subungual debris  from hallux to fifth toes bilaterally. No evidence of bacterial infection or drainage bilaterally.  Orthopedic  No limitations of motion  feet .  No crepitus or effusions noted.  No bony pathology or digital deformities noted.  Skin  normotropic skin with no porokeratosis noted bilaterally.  No signs of infections or ulcers noted.     Onychomycosis  Nails  B/L.  Pain in right toes  Pain in left toes  Debridement of nails both feet followed trimming the nails with dremel tool.    RTC 3 months.   Gardiner Barefoot DPM    Addendum  Patient called me back in the room since her nails were rough.  Therefore each of the nails were done over again using the nail nipper and dremel tool.  GAM

## 2023-01-17 ENCOUNTER — Telehealth: Payer: Self-pay | Admitting: Cardiology

## 2023-01-17 DIAGNOSIS — N3000 Acute cystitis without hematuria: Secondary | ICD-10-CM | POA: Diagnosis not present

## 2023-01-17 DIAGNOSIS — I110 Hypertensive heart disease with heart failure: Secondary | ICD-10-CM | POA: Diagnosis not present

## 2023-01-17 DIAGNOSIS — K449 Diaphragmatic hernia without obstruction or gangrene: Secondary | ICD-10-CM | POA: Diagnosis not present

## 2023-01-17 DIAGNOSIS — I480 Paroxysmal atrial fibrillation: Secondary | ICD-10-CM | POA: Diagnosis not present

## 2023-01-17 DIAGNOSIS — K5900 Constipation, unspecified: Secondary | ICD-10-CM | POA: Diagnosis not present

## 2023-01-17 DIAGNOSIS — I5032 Chronic diastolic (congestive) heart failure: Secondary | ICD-10-CM | POA: Diagnosis not present

## 2023-01-17 NOTE — Telephone Encounter (Signed)
Spoke with patient who states she is having increased edema since decreasing torsemide to 3 days per week. She states the left leg is worse. Patient was hospitalized on 11/22/22 and discharged on 12/03/22. She states since her medications were changed in the hospital she has been having more problems, swelling, and pain.  She weighs herself but inconsistently.  Reviewed her concerns with Ambrose Pancoast, NP who saw her last. He advised she take torsemide 20 bid for 2 days then resume the M- W- F schedule. Provided education on daily weights.   Patient wants to review her medications with a provider as well. Scheduled for appointment with Doctor of the Day on 01/23/2023.

## 2023-01-17 NOTE — Telephone Encounter (Signed)
Pt c/o swelling: STAT is pt has developed SOB within 24 hours  How much weight have you gained and in what time span? Believes weight has come down, but swelling is now worse.   If swelling, where is the swelling located? Both legs, but left leg is now worse.   Are you currently taking a fluid pill? Yes   Are you currently SOB? No   Do you have a log of your daily weights (if so, list)?   Have you gained 3 pounds in a day or 5 pounds in a week? No   Have you traveled recently? No

## 2023-01-21 NOTE — Progress Notes (Unsigned)
Cardiology Office Note:    Date:  01/23/2023   ID:  Drucie Scallon, DOB 19-Feb-1936, MRN [id number]  PCP:  Corliss Blacker, MD   [facility] Providers Cardiologist:  Freada Bergeron, MD {  Referring MD: Corliss Blacker, MD    History of Present Illness:    [name] is a 87 y.o. female with a hx of pAfib, chronic diastolic CHF, hypertension, HLD, thoracic aneurysm previously followed by Dr. Lucianne Lei Trigt-not felt to be a candidate for repair given severe kyphosis who was previously followed by Dr. Meda Coffee who now returns to clinic for follow-up.  Per review of the record, TTE 11/28/2016 with LVEF 55 to 60%, mild to moderate AI, moderate MR, severe atrial enlargement, severe TR.  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 Also with paroxysmal Afib s/p spontaneously conversion to normal sinus rhythm on diltiazem and flecainide.  Myoview 01/2017 poor quality study but low risk EF 72%.  Echo 07/2018 EF 55 to 60% with grade 1 DD mildly dilated ascending aorta with moderate MVP with MR and moderate to severe dilated LA moderate TR moderate pulmonary hypertension.  Seen in clinic 03/22/21 where she was feeling okay with mild dyspnea on exertion. TTE at that time showed moderate holosystolic prolapse with moderate-to-severe MR with possible P2 flail. Saw Dr. Burt Knack on 06/05/21 where she was deemed best for medical management given relative lack of symptoms and comorbidities. Repeat TTE showed stable moderate vs moderate-to-severe MR.   Was last seen in clinic on 02/16/22 where she was dealing with a UTI and LLE pain and swelling. LE doppler was negative for DVT. We recommended she follow-up with her PCP for her UTI. She was otherwise stable from a CV standpoint. We stopped flecainide at that time due to advanced age and comorbidities.  Was seen on 05/21/22 where she continued to have significant neuropathic and  arthritic pain. We increased her lasix to 4x/week instead of 3x/week for LE edema.She was otherwise stable from a CV standpoint.  Was admitted in 11/2022 for acute on chronic diastolic HF exacerbation and Afib with RVR. She was diuresed and placed on amiodarone PO in addition to her apixaban, metop and dilt.   Seen in clinic on 12/12/22 where she remained overloaded. We spent about 64mn adjusting and reviewing her medications.   Was last seen in clinic 01/09/23 where torsemide was decreased to '10mg'$  M, W, F.  Today, the patient is concerned her swelling is persisting. Wt has been staying essentially stable 98-100lbs. Blood pressure is well controlled. Has been wearing compression socks but this has not helped significantly. Her torsemide had to be decreased as her Cr/BUN were rising. She is wanting to trial back on the lasix and she believes this worked better for her. She is also interested in seeing a vein specialist in case element of venous stasis.    Past Medical History:  Diagnosis Date   Anxiety    Aortic regurgitation    Breast cancer ([place] 08/20/2011   R breast DCIS, ER/PR +   Cataract 3 and 10/92   bilateral   Chronic diastolic CHF (congestive heart failure) (HCC)    Compression fracture of fourth lumbar vertebra (HCC)    DVT (deep venous thrombosis) ([place] 08/2011   LL extremity    Fibromyalgia    Fracture lumbar vertebra-closed ([place]    Fracture of thoracic vertebra, closed ([place]  GERD (gastroesophageal reflux disease)    H/O hiatal hernia    History of blood clots    History of radiation therapy 01/2012   R breast   Hypercholesteremia    Hypertension    DR Orinda Kenner   Hypothyroidism    Mitral regurgitation    Mitral valve prolapse    Osteoporosis    PAF (paroxysmal atrial fibrillation) ([place])    a. dx 11/2016.   Pulmonary hypertension ([place])    Rib fractures    Thoracic ascending aortic aneurysm ([place])    a. followed by Dr. [place] Gum.   Tricuspid regurgitation      Past Surgical History:  Procedure Laterality Date   ANTERIOR CERVICAL DECOMP/DISCECTOMY FUSION N/A 09/09/2015   Procedure: Anterior Cervical Decompression and Fusion Cervical seven-Thorasic one ;  Surgeon: Erline Levine, MD;  Location: [place] NEURO ORS;  Service: Neurosurgery;  Laterality: N/A;   [place]  10/16/2006   RIH - Dr Hassell Done   KYPHOPLASTY N/A 01/26/2013   Procedure: KYPHOPLASTY;  Surgeon: Kristeen Miss, MD;  Location: [place] NEURO ORS;  Service: Neurosurgery;  Laterality: N/A;  T11 and L1   KYPHOPLASTY N/A 06/21/2017   Procedure: Lumbar four Kyphoplasty;  Surgeon: Erline Levine, MD;  Location: [place];  Service: Neurosurgery;  Laterality: N/A;   MASTECTOMY, PARTIAL  10/17/2011   Procedure: MASTECTOMY PARTIAL;  Surgeon: Haywood Lasso, MD;  Location: [place];  Service: General;  Laterality: Right;  needle guided   TEE WITHOUT CARDIOVERSION N/A 12/04/2016   Procedure: TRANSESOPHAGEAL ECHOCARDIOGRAM (TEE);  Surgeon: Pixie Casino, MD;  [facility];  Service: Cardiovascular;  Laterality: N/A;   TONSILLECTOMY  1944    Current Medications: Current Meds  Medication Sig   acetaminophen (TYLENOL) 325 MG tablet Take 2 tablets (650 mg total) by mouth every 6 (six) hours as needed for moderate pain or mild pain.   amiodarone (PACERONE) 200 MG tablet Take 1 tablet (200 mg total) by mouth daily.   apixaban (ELIQUIS) 2.5 MG TABS tablet Take 1 tablet (2.5 mg total) by mouth 2 (two) times daily.   Ascorbic Acid (VITA-C PO) Take 1,000 mg by mouth daily.   bisacodyl (DULCOLAX) 5 MG EC tablet Take 1 tablet (5 mg total) by mouth daily as needed for moderate constipation.   calcium citrate-vitamin D (CITRACAL+D) 315-200 MG-UNIT per tablet Take 1 tablet by mouth 2 (two) times daily.   cholecalciferol (VITAMIN D) 1000 UNITS tablet Take 2,000 Units by mouth every evening. Vitamin D3   diclofenac  Sodium (VOLTAREN) 1 % GEL Apply 2 g topically 3 (three) times daily as needed (join pains).   diltiazem (CARDIZEM CD) 180 MG 24 hr capsule Take 1 capsule by mouth daily   estradiol (ESTRACE) 0.1 MG/GM vaginal cream Place 1 Applicatorful vaginally once a week.   furosemide (LASIX) 20 MG tablet Take 1 tablet (20 mg total) by mouth daily.   HYDROcodone-acetaminophen (NORCO/VICODIN) 5-325 MG tablet Take 1-2 tablets by mouth every 6 (six) hours as needed (mild pain).   iron polysaccharides (NIFEREX) 150 MG capsule Take 1 capsule (150 mg total) by mouth 3 (three) times a week. M, W, F   levothyroxine (SYNTHROID) 125 MCG tablet Take 1 tablet (125 mcg total) by mouth daily before breakfast.   Meclizine HCl (BONINE PO) Take 12.5 mg by mouth daily as needed (dizziness).   metoprolol tartrate (  LOPRESSOR) 25 MG tablet Take 1 tablet (25 mg total) by mouth daily.   Multiple Vitamin (MULTIVITAMIN) tablet Take 1 tablet by mouth daily with breakfast.   OVER THE COUNTER MEDICATION Take 1,000 mg by mouth daily. D-Mannose 500 mg   pantoprazole (PROTONIX) 40 MG tablet Take 1 tablet (40 mg total) by mouth daily.   pravastatin (PRAVACHOL) 20 MG tablet Take 1 tablet (20 mg total) by mouth daily with supper.   risedronate (ACTONEL) 150 MG tablet Take 150 mg by mouth every 30 (thirty) days. with water on empty stomach, nothing by mouth or lie down for next 30 minutes.   senna-docusate (SENOKOT-S) 8.6-50 MG tablet Take 1 tablet by mouth 2 (two) times daily.   solifenacin (VESICARE) 10 MG tablet Take 1 tablet (10 mg total) by mouth daily.   [DISCONTINUED] potassium chloride SA (KLOR-CON M20) 20 MEQ tablet Take 1 tablet (20 mEq total) by mouth every Monday, Wednesday, and Friday.   [DISCONTINUED] torsemide (DEMADEX) 10 MG tablet Take 1 tablet (10 mg total) by mouth every Monday, Wednesday, and Friday.     Allergies:   Contrast media [iodinated contrast media], Iohexol, Mucinex [guaifenesin er], Zanaflex [tizanidine hcl],  Penicillins, and Robaxin [methocarbamol]   Social History   Socioeconomic History   Marital status: Widowed    Spouse name: Not on file   Number of children: Not on file   Years of education: Not on file   Highest education level: Not on file  Occupational History   Not on file  Tobacco Use   Smoking status: Never   Smokeless tobacco: Never  Vaping Use   Vaping Use: Never used  Substance and Sexual Activity   Alcohol use: No   Drug use: No   Sexual activity: Never  Other Topics Concern   Not on file  Social History Narrative   Are you right handed or left handed? Right   Are you currently employed ?    What is your current occupation? retired   Do you live at home alone? Yes but has a care giver,5 days a week   Who lives with you?    What type of home do you live in: 1 story or 2 story? one    Caffeine 1 soda aday   Social Determinants of Health   Financial Resource Strain: Not on file  Food Insecurity: No Food Insecurity (11/22/2022)   Hunger Vital Sign    Worried About Running Out of Food in the Last Year: Never true    Ran Out of Food in the Last Year: Never true  Transportation Needs: No Transportation Needs (11/22/2022)   PRAPARE - Hydrologist (Medical): No    Lack of Transportation (Non-Medical): No  Physical Activity: Not on file  Stress: Not on file  Social Connections: Not on file     Family History: The patient's family history includes Heart disease in her maternal grandfather, maternal uncle, and mother.  ROS:   Please see the history of present illness.   Review of Systems  Constitutional:  Positive for malaise/fatigue. Negative for chills and fever.  HENT:  Negative for nosebleeds and tinnitus.   Eyes:  Negative for blurred vision and pain.  Respiratory:  Positive for cough. Negative for hemoptysis, shortness of breath and stridor.   Cardiovascular:  Positive for leg swelling. Negative for chest pain, palpitations,  orthopnea, claudication and PND.  Gastrointestinal:  Negative for blood in stool, diarrhea, nausea and vomiting.  Genitourinary:  Negative for dysuria and hematuria.  Musculoskeletal:  Positive for back pain and myalgias (lower extremity). Negative for falls.  Neurological:  Positive for dizziness, tingling (lower extremity) and weakness. Negative for loss of consciousness and headaches.       + loss of balance  Psychiatric/Behavioral:  Negative for depression, hallucinations and substance abuse. The patient does not have insomnia.     EKGs/Labs/Other Studies Reviewed:    The following studies were reviewed today: TTE 2021/11/25: IMPRESSIONS   1. Left ventricular ejection fraction, by estimation, is 60 to 65%. The left ventricle has normal function. The left ventricle has no regional wall motion abnormalities. There is moderate hypertrophy of the basal septal segment. The rest of the LV  segments demonstrate mild asymmetric left ventricular hypertrophy of the basal-septal segment. Left ventricular diastolic parameters are consistent with Grade I diastolic dysfunction (impaired relaxation).   2. Right ventricular systolic function is normal. The right ventricular size is mildly enlarged. There is severely elevated pulmonary artery systolic pressure. The estimated right ventricular systolic pressure is  0000000 mmHg.   3. Left atrial size was severely dilated.   4. Right atrial size was mildly dilated.   5. The mitral valve is myxomatous. There is bileaflet prolapse that is more notable in the posterior mitral valve leaflet. There is moderate mitral regurgitation EROA 0.23 with Rvol 46m.   6. Tricuspid valve regurgitation is moderate to severe.   7. The aortic valve is tricuspid. There is mild calcification of the aortic valve. There is mild thickening of the aortic valve. Aortic valve regurgitation is mild to moderate.   8. Aortic dilatation noted. There is moderate-to-severe dilatation of the  ascending aorta, measuring 49 mm. There is mild dilatation of the aortic root, measuring 40 mm.   Comparison(s): Compared to prior TTE in 04/2021, the mitral regurgitation  appears moderate on current study which is slightly less than prior in the  setting of a lower systolic blood pressure. The ascending aortic size  remains stably dilated at 470m (previously 5049m   TTE 04/2021: IMPRESSIONS   1. Left ventricular ejection fraction, by estimation, is 60 to 65%. The left ventricle has normal function. The left ventricle has no regional wall motion abnormalities. There is moderate asymmetric hypertrophy of the basal septum. The rest of the LV  segments demonstrate mild left ventricular hypertrophy. Left ventricular diastolic parameters are consistent with Grade I diastolic dysfunction (impaired relaxation).   2. Right ventricular systolic function is normal. The right ventricular size is normal. There is mildly elevated pulmonary artery systolic pressure. The estimated right ventricular systolic pressure is 45.0000000Hg.   3. Left atrial size was severely dilated.   4. The mitral valve is myxomatous. There is moderate holosystolic prolapse of of the mitral valve most notably of the posterior mitral valve leaflet with possible P2 flail. Using a PISA radius of 0.9, there is at least moderate-to-severe mitral  regurgitation with EROA 0.3cm2, RVol 66m34mecommend TEE for further evaluation.   5. Tricuspid valve regurgitation is moderate.   6. The aortic valve is tricuspid. There is mild calcification of the  aortic valve. There is mild thickening of the aortic valve. Aortic valve regurgitation is mild to moderate. Mild to moderate aortic valve sclerosis/calcification is present, without any evidence of aortic stenosis.   7. Aortic dilatation noted. There is mild dilatation of the aortic root, measuring 39 mm. There is moderate-to-severe dilatation of the ascending aorta, measuring 50 mm.   8. The  inferior  vena cava is normal in size with greater than 50% respiratory variability, suggesting right atrial pressure of 3 mmHg.   9. Right atrial size was mildly dilated.   Comparison(s): Compared to prior echo, the MR now appears severe. The ascending aorta now measures 56m (previously 463m.  2D echo 08/06/2018 Study Conclusions   - Left ventricle: The cavity size was normal. There was severe    focal basal hypertrophy. Systolic function was normal. The    estimated ejection fraction was in the range of 55% to 60%.   Wall motion was normal; there were no regional wall motion    abnormalities. There was an increased relative contribution of    atrial contraction to ventricular filling. Doppler parameters are    consistent with abnormal left ventricular relaxation (grade 1    diastolic dysfunction). Doppler parameters are consistent with    high ventricular filling pressure.  - Aortic valve: Trileaflet; mildly thickened, mildly calcified    leaflets. There was mild regurgitation.  - Aorta: Ascending aorta diameter: 41 mm (ED).  - Ascending aorta: The ascending aorta was mildly dilated.  - Mitral valve: Mild diffuse thickening, consistent with myxomatous    proliferation. Moderate, holosystolic prolapse, involving the    posterior > anterior leaflet. There was moderate regurgitation.    Effective regurgitant orifice (PISA): 0.15 cm^2. Regurgitant    volume (PISA): 32 ml.  - Left atrium: The atrium was moderately to severely dilated.  - Tricuspid valve: There was moderate regurgitation.  - Pulmonary arteries: PA peak pressure: 45 mm Hg (S).   Impressions:   - The right ventricular systolic pressure was increased consistent    with moderate pulmonary hypertension.  Myoview 01/2017: The nuclear study is very poor quality due to body habitus Defect 1: There is a defect present in the basal inferolateral and mid inferolateral location that is most likely artifact due to uptake in  structures below the diaphragm. This is a low risk study. Nuclear stress EF: 72%. The left ventricular ejection fraction is hyperdynamic (>65%).     EKG:  No new tracing today 05/21/2022 EKG: NSR, iRBBB, LAFB, PAC HR 87 02/16/2022: not ordered today 03/22/21: NSR, LAFB, anterior infarct (unchanged), first degree AVB, HR 65  Recent Labs: 11/29/2022: B Natriuretic Peptide 789.3; Hemoglobin 14.3; Platelets 235 01/09/2023: ALT 21; BUN 38; Creatinine, Ser 1.01; Magnesium 2.1; Potassium 4.1; Sodium 137; TSH 2.020  Recent Lipid Panel    Component Value Date/Time   CHOL  12/02/2010 0400    149        ATP III CLASSIFICATION:  <200     mg/dL   Desirable  200-239  mg/dL   Borderline High  >=240    mg/dL   High          TRIG 54 12/02/2010 0400   HDL 58 12/02/2010 0400   CHOLHDL 2.6 12/02/2010 0400   VLDL 11 12/02/2010 0400   LDLCALC  12/02/2010 0400    80        Total Cholesterol/HDL:CHD Risk Coronary Heart Disease Risk Table                     Men   Women  1/2 Average Risk   3.4   3.3  Average Risk       5.0   4.4  2 X Average Risk   9.6   7.1  3 X Average Risk  23.4   11.0        Use the  calculated Patient Ratio above and the CHD Risk Table to determine the patient's CHD Risk.        ATP III CLASSIFICATION (LDL):  <100     mg/dL   Optimal  100-129  mg/dL   Near or Above                    Optimal  130-159  mg/dL   Borderline  160-189  mg/dL   High  >190     mg/dL   Very High     Risk Assessment/Calculations:    CHA2DS2-VASc Score = 6  This indicates a 9.7% annual risk of stroke. The patient's score is based upon: CHF History: 1 HTN History: 1 Diabetes History: 0 Stroke History: 0 Vascular Disease History: 1 Age Score: 2 Gender Score: 1     Physical Exam:    VS:  BP 120/82 (BP Location: Left Arm, Patient Position: Sitting, Cuff Size: Normal)   Pulse (!) 55   Ht '4\' 11"'$  (1.499 m)   Wt 103 lb 6.4 oz (46.9 kg)   SpO2 93%   BMI 20.88 kg/m     Wt Readings from  Last 3 Encounters:  01/23/23 103 lb 6.4 oz (46.9 kg)  01/09/23 98 lb 12.8 oz (44.8 kg)  01/07/23 100 lb (45.4 kg)     GEN: Elderly female, kyphotic HEENT:  Left eye strabismus NECK: No JVD; No carotid bruits CARDIAC: RRR, 3/6 systolic murmur heard throughout the precordium RESPIRATORY:  Diminished but clear ABDOMEN: Soft, non-tender, non-distended MUSCULOSKELETAL:3+ LE edema on the left (chronically L>R) and 2+ on right with chronic venous stasis changes SKIN: Chronic venous stasis changes NEUROLOGIC:  Alert and oriented x 3 PSYCHIATRIC:  Normal affect   ASSESSMENT:    1. Acute on chronic diastolic heart failure ([place])   2. Severe mitral regurgitation   3. Chronic bilateral thoracic back pain   4. Aneurysm of ascending aorta without rupture ([place])   5. PAF (paroxysmal atrial fibrillation) ([place])   6. Essential hypertension   7. Hypokalemia   8. Secondary hypercoagulable state ([place])   9. Bilateral leg edema   10. Medication management       PLAN:    In order of problems listed above:  No problem-specific Assessment & Plan notes found for this encounter.  #Acute on Chronic diastolic CHF: #LE edema: TTE 05/2022 with EF 60-65%, severe RV enlargement with preserved function, PASP 74, severe MR, mild to moderate AR, severe aortic dilation 35m, severe TR. Not a surgical candidate. Currently with persistent LE edema despite diuresis. Unfortunately, I am not sure her LE edema will completely resolve as her symptoms persisted despite labs reflecting she was dry. Will change back to lasix per patient preference and monitor response. Will refer to vein and vascular as well as suspect venous stasis is contributing. -Change to lasix '20mg'$  daily with potassium 262m daily -Check BMET and BNP next week -Refer to vein and vascular per patient preference -Wears compression socks daily -Off losartan due to soft blood pressures in the hospital  #Paroxysmal Afib: CHADs-vasc 6. With recent  admission for Afib with RVR now on amiodarone, dilt and metop for rate/rhythm control and [facility]aban [facility]-Continue amiodarone '200mg'$  daily -Change to metop succinate 12.'5mg'$  XL daily -Continue diltiazem '180mg'$  daily -Continue apixaban '5mg'$  BID   #Thoracic aortic aneurysm: Previously followed by Dr. [place] Gum[place] transitioned to Dr. AtOrvan SeenManaged conservatively as deemed to be not a surgical candidate -Change to metop succinate 12.'5mg'$  XL  daily -Continue pravsatatin '20mg'$    #Valvular heart disease with moderate MVP with severe MR, Severe TR mild AI and severe pulmonary hypertension: TTE 05/2022 with EF 60-65%, severe RV enlargement with preserved function, PASP 74, severe MR, mild to moderate AR, severe aortic dilation 86m, severe TR. Not a surgical candidate. On medical therapy.  -Medical management -Change to lasix '20mg'$  daily with potassium 269m daily   #Hyperlipidemia: -Continue pravasatin '20mg'$  daily   Medication Adjustments/Labs and Tests Ordered: Current medicines are reviewed at length with the patient today.  Concerns regarding medicines are outlined above.  Orders Placed This Encounter  Procedures   Basic metabolic panel   Brain natriuretic peptide   Ambulatory referral to Vascular Surgery   Meds ordered this encounter  Medications   potassium chloride SA (KLOR-CON M20) 20 MEQ tablet    Sig: Take 1 tablet (20 mEq total) by mouth daily.    Dispense:  90 tablet    Refill:  3   furosemide (LASIX) 20 MG tablet    Sig: Take 1 tablet (20 mg total) by mouth daily.    Dispense:  90 tablet    Refill:  3    Patient Instructions  Medication Instructions:  Your physician has recommended you make the following change in your medication:   Stop: Torsemide   Start: Lasix '20mg'$  daily   Start: Potassium 2027mdaily   *If you need a refill on your cardiac medications before your next appointment, please call your pharmacy*   Lab Work: Please return for Lab work in one week  for BMP/BNP. You may come to the...   [facility] (3rd floor) [id number] [id number]: 8am-Noon and 1pm-4:30pm  Please ring the doorbell on the small table when you exit the elevator and the Lab Tech will come get you  [facility] [id number]: 8am-1pm, then 2pm-4:30pm   LabEdisonlease see attached locations sheet stapled to your lab work with address and hours.   If you have labs (blood work) drawn today and your tests are completely normal, you will receive your results only by: MyCSigelf you have MyChart) OR A paper copy in the mail If you have any lab test that is abnormal or we need to change your treatment, we will call you to review the results.  Follow-Up: [facility] and your health needs are our priority.  As part of our continuing mission to provide you with exceptional heart care, we have created designated Provider Care Teams.  These Care Teams include your primary Cardiologist (physician) and Advanced Practice Providers (APPs -  Physician Assistants and Nurse Practitioners) who all work together to provide you with the care you need, when you need it.  We recommend signing up for the patient portal called "MyChart".  Sign up information is provided on this After Visit Summary.  MyChart is used to connect with patients for Virtual Visits (Telemedicine).  Patients are able to view lab/test results, encounter notes, upcoming appointments, etc.  Non-urgent messages can be sent to your provider as well.   To learn more about what you can do with MyChart, go to [email or web address]  Your next appointment:   Follow up as scheduled with ErnAmbrose PancoastP   Other Instructions We have referred you to the [facility] Chronic Venous Stasis Changes     HeaGwyndolyn KaufmanD

## 2023-01-23 ENCOUNTER — Ambulatory Visit (INDEPENDENT_AMBULATORY_CARE_PROVIDER_SITE_OTHER): Payer: Medicare Other | Admitting: Cardiology

## 2023-01-23 ENCOUNTER — Encounter (HOSPITAL_BASED_OUTPATIENT_CLINIC_OR_DEPARTMENT_OTHER): Payer: Self-pay | Admitting: Cardiology

## 2023-01-23 VITALS — BP 120/82 | HR 55 | Ht 59.0 in | Wt 103.4 lb

## 2023-01-23 DIAGNOSIS — G8929 Other chronic pain: Secondary | ICD-10-CM

## 2023-01-23 DIAGNOSIS — D6869 Other thrombophilia: Secondary | ICD-10-CM

## 2023-01-23 DIAGNOSIS — I340 Nonrheumatic mitral (valve) insufficiency: Secondary | ICD-10-CM | POA: Diagnosis not present

## 2023-01-23 DIAGNOSIS — I10 Essential (primary) hypertension: Secondary | ICD-10-CM | POA: Diagnosis not present

## 2023-01-23 DIAGNOSIS — E876 Hypokalemia: Secondary | ICD-10-CM | POA: Diagnosis not present

## 2023-01-23 DIAGNOSIS — R600 Localized edema: Secondary | ICD-10-CM

## 2023-01-23 DIAGNOSIS — I5033 Acute on chronic diastolic (congestive) heart failure: Secondary | ICD-10-CM

## 2023-01-23 DIAGNOSIS — I480 Paroxysmal atrial fibrillation: Secondary | ICD-10-CM | POA: Diagnosis not present

## 2023-01-23 DIAGNOSIS — Z79899 Other long term (current) drug therapy: Secondary | ICD-10-CM | POA: Diagnosis not present

## 2023-01-23 DIAGNOSIS — M546 Pain in thoracic spine: Secondary | ICD-10-CM

## 2023-01-23 DIAGNOSIS — I7121 Aneurysm of the ascending aorta, without rupture: Secondary | ICD-10-CM

## 2023-01-23 MED ORDER — FUROSEMIDE 20 MG PO TABS: 20.0000 mg | ORAL_TABLET | Freq: Every day | ORAL | 3 refills | Status: DC

## 2023-01-23 MED ORDER — POTASSIUM CHLORIDE CRYS ER 20 MEQ PO TBCR: 20.0000 meq | EXTENDED_RELEASE_TABLET | Freq: Every day | ORAL | 3 refills | Status: DC

## 2023-01-23 NOTE — Patient Instructions (Signed)
Medication Instructions:  Your physician has recommended you make the following change in your medication:   Stop: Torsemide   Start: Lasix '20mg'$  daily   Start: Potassium 55mq daily   *If you need a refill on your cardiac medications before your next appointment, please call your pharmacy*   Lab Work: Please return for Lab work in one week for BMP/BNP. You may come to the...   [facility] (3rd floor) [address] [place] [id number] Open: 8am-Noon and 1pm-4:30pm  Please ring the doorbell on the small table when you exit the elevator and the Lab Tech will come get you  [place]SHarmony GBlue Ridge Homestead [id number]: 8am-1pm, then 2pm-4:30pm   [place] Please see attached locations sheet stapled to your lab work with address and hours.   If you have labs (blood work) drawn today and your tests are completely normal, you will receive your results only by: MEdwards(if you have MyChart) OR A paper copy in the mail If you have any lab test that is abnormal or we need to change your treatment, we will call you to review the results.  Follow-Up: At [facility] you and your health needs are our priority.  As part of our continuing mission to provide you with exceptional heart care, we have created designated Provider Care Teams.  These Care Teams include your primary Cardiologist (physician) and Advanced Practice Providers (APPs -  Physician Assistants and Nurse Practitioners) who all work together to provide you with the care you need, when you need it.  We recommend signing up for the patient portal called "MyChart".  Sign up information is provided on this After Visit Summary.  MyChart is used to connect with patients for Virtual Visits (Telemedicine).  Patients are able to view lab/test results, encounter notes, upcoming appointments, etc.  Non-urgent messages can be sent to your  provider as well.   To learn more about what you can do with MyChart, go to [email or web address]    Your next appointment:   Follow up as scheduled with EAmbrose Pancoast NP   Other Instructions We have referred you to the VPlatofor Chronic Venous Stasis Changes

## 2023-01-28 ENCOUNTER — Other Ambulatory Visit: Payer: Self-pay | Admitting: *Deleted

## 2023-01-28 DIAGNOSIS — I878 Other specified disorders of veins: Secondary | ICD-10-CM

## 2023-01-29 ENCOUNTER — Ambulatory Visit: Payer: Medicare Other | Attending: Cardiology

## 2023-01-29 DIAGNOSIS — I10 Essential (primary) hypertension: Secondary | ICD-10-CM | POA: Diagnosis not present

## 2023-01-29 DIAGNOSIS — I7121 Aneurysm of the ascending aorta, without rupture: Secondary | ICD-10-CM | POA: Diagnosis not present

## 2023-01-29 DIAGNOSIS — M546 Pain in thoracic spine: Secondary | ICD-10-CM | POA: Diagnosis not present

## 2023-01-29 DIAGNOSIS — E876 Hypokalemia: Secondary | ICD-10-CM | POA: Diagnosis not present

## 2023-01-29 DIAGNOSIS — I5032 Chronic diastolic (congestive) heart failure: Secondary | ICD-10-CM | POA: Diagnosis not present

## 2023-01-29 DIAGNOSIS — G8929 Other chronic pain: Secondary | ICD-10-CM | POA: Diagnosis not present

## 2023-01-29 DIAGNOSIS — I480 Paroxysmal atrial fibrillation: Secondary | ICD-10-CM | POA: Diagnosis not present

## 2023-01-29 DIAGNOSIS — I340 Nonrheumatic mitral (valve) insufficiency: Secondary | ICD-10-CM | POA: Diagnosis not present

## 2023-01-30 ENCOUNTER — Telehealth: Payer: Self-pay | Admitting: Cardiology

## 2023-01-30 LAB — BASIC METABOLIC PANEL
BUN/Creatinine Ratio: 34 — ABNORMAL HIGH (ref 12–28)
BUN: 29 mg/dL — ABNORMAL HIGH (ref 8–27)
CO2: 23 mmol/L (ref 20–29)
Calcium: 9.9 mg/dL (ref 8.7–10.3)
Chloride: 98 mmol/L (ref 96–106)
Creatinine, Ser: 0.85 mg/dL (ref 0.57–1.00)
Glucose: 90 mg/dL (ref 70–99)
Potassium: 4.2 mmol/L (ref 3.5–5.2)
Sodium: 137 mmol/L (ref 134–144)
eGFR: 68 mL/min/{1.73_m2} (ref >59)

## 2023-01-30 LAB — BRAIN NATRIURETIC PEPTIDE: BNP: 183.2 pg/mL — ABNORMAL HIGH (ref 0.0–100.0)

## 2023-01-30 NOTE — Telephone Encounter (Signed)
-----   Message from Freada Bergeron, MD sent at 01/30/2023  4:38 PM EDT ----- All labs look significantly better. Kidney function is back to baseline. Fluid levels significantly improved. This is great news!!

## 2023-01-30 NOTE — Telephone Encounter (Signed)
Patient would like a call back when lab results from [facility] are received. She states she had lab work done yesterday.

## 2023-01-30 NOTE — Telephone Encounter (Signed)
Pt is calling asking for lab results she had done at a [facility] yesterday.   I have not received results on the pt yet.    We will call the pt once results received and reviewed by Dr. Johney Frame.  Will send to Dr. Johney Frame as a general FYI.

## 2023-01-30 NOTE — Telephone Encounter (Signed)
The patient has been notified of the result and verbalized understanding.  All questions (if any) were answered. Nuala Alpha, LPN [id number] PM

## 2023-01-31 ENCOUNTER — Ambulatory Visit (HOSPITAL_COMMUNITY)
Admission: RE | Admit: 2023-01-31 | Discharge: 2023-01-31 | Disposition: A | Discharge status: Home / Self Care | Payer: Medicare Other | Source: Ambulatory Visit | Attending: Vascular Surgery | Admitting: Vascular Surgery

## 2023-01-31 DIAGNOSIS — I878 Other specified disorders of veins: Secondary | ICD-10-CM

## 2023-02-06 ENCOUNTER — Ambulatory Visit (INDEPENDENT_AMBULATORY_CARE_PROVIDER_SITE_OTHER): Payer: Medicare Other | Admitting: Physician Assistant

## 2023-02-06 VITALS — BP 122/63 | HR 54 | Temp 97.0°F | Resp 18 | Ht 59.0 in | Wt 103.1 lb

## 2023-02-06 DIAGNOSIS — I878 Other specified disorders of veins: Secondary | ICD-10-CM

## 2023-02-06 DIAGNOSIS — M7989 Other specified soft tissue disorders: Secondary | ICD-10-CM

## 2023-02-06 DIAGNOSIS — I872 Venous insufficiency (chronic) (peripheral): Secondary | ICD-10-CM | POA: Diagnosis not present

## 2023-02-06 NOTE — Progress Notes (Signed)
Requested by:  Freada Bergeron, MD [phone] [address] [place],  [place] [address]  Reason for consultation: bilateral lower extremity swelling     History of Present Illness   [name] is a 87 y.o. ([birth year]) female who presents for evaluation of bilateral lower extremity swelling.  The patient states that she has had lower leg swelling, left greater than right, for a few years but this is significantly worsened over the past few months.  The swelling is typically from her ankle to her knee in both legs.  She states that the swelling is fairly constant.  She tries to avoid sitting for long periods of time to prevent more swelling.  The swelling in her legs causes an aching pain. She has also noticed a progression in the discoloration of her lower legs, with purple/red discoloration to her lower left leg.   She wears knee-high compression stockings daily and experiences some benefit from them.  She states that she is unable to elevate her feet above her heart due to chronic back issues.  She has no history of previous vein procedures.  She states that she has had a DVT in her left leg at least twice, once being in 2002 in the popliteal vein.  Past Medical History:  Diagnosis Date   Anxiety    Aortic regurgitation    Breast cancer ([place]) 08/20/2011   R breast DCIS, ER/PR +   Cataract 3 and 10/92   bilateral   Chronic diastolic CHF (congestive heart failure) (HCC)    Compression fracture of fourth lumbar vertebra (HCC)    DVT (deep venous thrombosis) ([place]) 08/2011   LL extremity    Fibromyalgia    Fracture lumbar vertebra-closed (HCC)    Fracture of thoracic vertebra, closed (HCC)    GERD (gastroesophageal reflux disease)    H/O hiatal hernia    History of blood clots    History of radiation therapy 01/2012   R breast   Hypercholesteremia    Hypertension    DR Orinda Kenner   Hypothyroidism    Mitral regurgitation    Mitral valve prolapse    Osteoporosis     PAF (paroxysmal atrial fibrillation) ([place])    a. dx 11/2016.   Pulmonary hypertension ([place])    Rib fractures    Thoracic ascending aortic aneurysm ([place])    a. followed by Dr. Prescott Gum.   Tricuspid regurgitation     Past Surgical History:  Procedure Laterality Date   ANTERIOR CERVICAL DECOMP/DISCECTOMY FUSION N/A 09/09/2015   Procedure: Anterior Cervical Decompression and Fusion Cervical seven-Thorasic one ;  Surgeon: Erline Levine, MD;  Location: [facility]URO [facility]S;  Service: Neurosurgery;  Laterality: N/A;   [place]  10/16/2006   RIH - Dr Hassell Done   KYPHOPLASTY N/A 01/26/2013   Procedure: KYPHOPLASTY;  Surgeon: Kristeen Miss, MD;  Location: [facility]S;  Service: Neurosurgery;  Laterality: N/A;  T11 and L1   KYPHOPLASTY N/A 06/21/2017   Procedure: Lumbar four Kyphoplasty;  Surgeon: Erline Levine, MD;  Location: [facility];  Service: Neurosurgery;  Laterality: N/A;   MASTECTOMY, PARTIAL  10/17/2011   Procedure: MASTECTOMY PARTIAL;  Surgeon: Haywood Lasso, MD;  Location: [facility];  Service: General;  Laterality: Right;  needle guided   TEE WITHOUT CARDIOVERSION N/A 12/04/2016   Procedure: TRANSESOPHAGEAL ECHOCARDIOGRAM (TEE);  Surgeon: Pixie Casino, MD;  Location: [facility] ENDOSCOPY;  Service: Cardiovascular;  Laterality: N/A;   TONSILLECTOMY  1944    Social History   Socioeconomic History   Marital status: Widowed    Spouse name: Not on file   Number of children: Not on file   Years of education: Not on file   Highest education level: Not on file  Occupational History   Not on file  Tobacco Use   Smoking status: Never   Smokeless tobacco: Never  Vaping Use   Vaping Use: Never used  Substance and Sexual Activity   Alcohol use: No   Drug use: No   Sexual activity: Never  Other Topics Concern   Not on file  Social History Narrative   Are you right handed or left handed? Right   Are  you currently employed ?    What is your current occupation? retired   Do you live at home alone? Yes but has a care giver,5 days a week   Who lives with you?    What type of home do you live in: 1 story or 2 story? one    Caffeine 1 soda aday   Social Determinants of Health   Financial Resource Strain: Not on file  Food Insecurity: No Food Insecurity (11/22/2022)   Hunger Vital Sign    Worried About Running Out of Food in the Last Year: Never true    Ran Out of Food in the Last Year: Never true  Transportation Needs: No Transportation Needs (11/22/2022)   PRAPARE - Hydrologist (Medical): No    Lack of Transportation (Non-Medical): No  Physical Activity: Not on file  Stress: Not on file  Social Connections: Not on file  Intimate Partner Violence: Not At Risk (11/22/2022)   Humiliation, Afraid, Rape, and Kick questionnaire    Fear of Current or Ex-Partner: No    Emotionally Abused: No    Physically Abused: No    Sexually Abused: No    Family History  Problem Relation Age of Onset   Heart disease Mother    Heart disease Maternal Uncle    Heart disease Maternal Grandfather     Current Outpatient Medications  Medication Sig Dispense Refill   acetaminophen (TYLENOL) 325 MG tablet Take 2 tablets (650 mg total) by mouth every 6 (six) hours as needed for moderate pain or mild pain.     amiodarone (PACERONE) 200 MG tablet Take 1 tablet (200 mg total) by mouth daily. 90 tablet 3   apixaban (ELIQUIS) 2.5 MG TABS tablet Take 1 tablet (2.5 mg total) by mouth 2 (two) times daily. 60 tablet 1   Ascorbic Acid (VITA-C PO) Take 1,000 mg by mouth daily.     bisacodyl (DULCOLAX) 5 MG EC tablet Take 1 tablet (5 mg total) by mouth daily as needed for moderate constipation. 30 tablet 1   calcium citrate-vitamin D (CITRACAL+D) 315-200 MG-UNIT per tablet Take 1 tablet by mouth 2 (two) times daily. 60 tablet 11   cholecalciferol (VITAMIN D) 1000 UNITS tablet Take 2,000  Units by mouth every evening. Vitamin D3     diclofenac Sodium (VOLTAREN) 1 % GEL Apply 2 g topically 3 (three) times daily as needed (join pains). 50 g 0   diltiazem (CARDIZEM CD) 180 MG 24 hr capsule Take 1 capsule by mouth daily 30 capsule 1   estradiol (ESTRACE) 0.1 MG/GM vaginal cream Place 1 Applicatorful vaginally once a week.  furosemide (LASIX) 20 MG tablet Take 1 tablet (20 mg total) by mouth daily. 90 tablet 3   HYDROcodone-acetaminophen (NORCO/VICODIN) 5-325 MG tablet Take 1-2 tablets by mouth every 6 (six) hours as needed (mild pain). 20 tablet 0   iron polysaccharides (NIFEREX) 150 MG capsule Take 1 capsule (150 mg total) by mouth 3 (three) times a week. M, W, F 30 capsule 0   levothyroxine (SYNTHROID) 125 MCG tablet Take 1 tablet (125 mcg total) by mouth daily before breakfast. 30 tablet 1   Meclizine HCl (BONINE PO) Take 12.5 mg by mouth daily as needed (dizziness).     metoprolol tartrate (LOPRESSOR) 25 MG tablet Take 1 tablet (25 mg total) by mouth daily. 30 tablet 1   Multiple Vitamin (MULTIVITAMIN) tablet Take 1 tablet by mouth daily with breakfast.     OVER THE COUNTER MEDICATION Take 1,000 mg by mouth daily. D-Mannose 500 mg     pantoprazole (PROTONIX) 40 MG tablet Take 1 tablet (40 mg total) by mouth daily. 30 tablet 6   potassium chloride SA (KLOR-CON M20) 20 MEQ tablet Take 1 tablet (20 mEq total) by mouth daily. 90 tablet 3   pravastatin (PRAVACHOL) 20 MG tablet Take 1 tablet (20 mg total) by mouth daily with supper. 30 tablet 1   risedronate (ACTONEL) 150 MG tablet Take 150 mg by mouth every 30 (thirty) days. with water on empty stomach, nothing by mouth or lie down for next 30 minutes.     senna-docusate (SENOKOT-S) 8.6-50 MG tablet Take 1 tablet by mouth 2 (two) times daily. 60 tablet 1   solifenacin (VESICARE) 10 MG tablet Take 1 tablet (10 mg total) by mouth daily. 30 tablet 1   No current facility-administered medications for this visit.    Allergies   Allergen Reactions   Contrast Media [Iodinated Contrast Media] Shortness Of Breath   Iohexol Shortness Of Breath   Mucinex [Guaifenesin Er] Other (See Comments)    DIZZINESS   Zanaflex [Tizanidine Hcl] Anxiety and Other (See Comments)    Dizziness   Penicillins Rash    Has patient had a PCN reaction causing immediate rash, facial/tongue/throat swelling, SOB or lightheadedness with hypotension: No Has patient had a PCN reaction causing severe rash involving mucus membranes or skin necrosis: No Has patient had a PCN reaction that required hospitalization No Has patient had a PCN reaction occurring within the last 10 years: No If all of the above answers are "NO", then may proceed with Cephalosporin use.    Robaxin [Methocarbamol] Diarrhea    REVIEW OF SYSTEMS (negative unless checked):   Cardiac:  []  Chest pain or chest pressure? []  Shortness of breath upon activity? []  Shortness of breath when lying flat? []  Irregular heart rhythm?  Vascular:  []  Pain in calf, thigh, or hip brought on by walking? []  Pain in feet at night that wakes you up from your sleep? []  Blood clot in your veins? [x]  Leg swelling?  Pulmonary:  []  Oxygen at home? []  Productive cough? []  Wheezing?  Neurologic:  []  Sudden weakness in arms or legs? []  Sudden numbness in arms or legs? []  Sudden onset of difficult speaking or slurred speech? []  Temporary loss of vision in one eye? []  Problems with dizziness?  Gastrointestinal:  []  Blood in stool? []  Vomited blood?  Genitourinary:  []  Burning when urinating? []  Blood in urine?  Psychiatric:  []  Major depression  Hematologic:  []  Bleeding problems? []  Problems with blood clotting?  Dermatologic:  []  Rashes or ulcers?  Constitutional:  []  Fever or chills?  Ear/Nose/Throat:  []  Change in hearing? []  Nose bleeds? []  Sore throat?  Musculoskeletal:  []  Back pain? []  Joint pain? []  Muscle pain?   Physical Examination     Vitals:    02/06/23 1023  BP: 122/63  Pulse: (!) 54  Resp: 18  Temp: (!) 97 F (36.1 C)  TempSrc: Oral  SpO2: 98%  Weight: 103 lb 1.6 oz (46.8 kg)  Height: 4\' 11"  (1.499 m)   Body mass index is 20.82 kg/m.  General:  WDWN in NAD; vital signs documented above Gait: Not observed HENT: WNL, normocephalic Pulmonary: normal non-labored breathing  Cardiac: Regular Abdomen: soft, NT, no masses Skin: without rashes Vascular Exam/Pulses: Biphasic doppler signals in BLE Extremities: Edematous bilateral lower extremities, left greater than right.  Chronic venous stasis changes with stasis pigmentation in both lower legs, left greater than right.   No ulcerations or cellulitis Musculoskeletal: no muscle wasting or atrophy  Neurologic: A&O X 3;  No focal weakness or paresthesias are detected Psychiatric:  The pt has Normal affect.  Non-invasive Vascular Imaging   LLE Venous Insufficiency Duplex (02/06/2023):   +--------------+---------+------+-----------+------------+--------+  LEFT         Reflux NoRefluxReflux TimeDiameter cmsComments                          Yes                                   +--------------+---------+------+-----------+------------+--------+  CFV                    yes   >1 second                       +--------------+---------+------+-----------+------------+--------+  FV mid        no                                              +--------------+---------+------+-----------+------------+--------+  Popliteal    no                                              +--------------+---------+------+-----------+------------+--------+  GSV at SFJ              yes    >500 ms      0.76              +--------------+---------+------+-----------+------------+--------+  GSV prox thigh          yes    >500 ms      0.6               +--------------+---------+------+-----------+------------+--------+  GSV mid thigh           yes    >500 ms       0.53              +--------------+---------+------+-----------+------------+--------+  GSV dist thigh          yes    >500 ms      0.44              +--------------+---------+------+-----------+------------+--------+  GSV at knee  yes    >500 ms      0.47              +--------------+---------+------+-----------+------------+--------+  GSV prox calf           yes    >500 ms      0.68              +--------------+---------+------+-----------+------------+--------+  GSV mid calf            yes    >500 ms      0.55              +--------------+---------+------+-----------+------------+--------+  SSV Pop Fossa no                            0.28              +--------------+---------+------+-----------+------------+--------+  SSV prox calf no                            0.26              +--------------+---------+------+-----------+------------+--------+   Medical Decision Making   Takota Rackow is a 87 y.o. female who presents bilateral lower extremity swelling  Based on the patient's duplex study, there is reflux in the left common femoral vein and the left greater saphenous vein from the saphenofemoral junction to the mid calf.  The greater saphenous vein is also greater than 4 mm in its entirety, therefore she would be a candidate for greater saphenous vein ablation She has bilateral lower extremity swelling, left greater than right.  She also has chronic venous stasis changes to both of her lower legs.  She is attempting to use knee-high compression stockings and elevating her legs to help with leg swelling, with minimal benefit.  She would like to pursue greater saphenous vein ablation to help with her leg swelling Due to generalized weakness, she is unable to wear thigh-high compression stockings.  She will continue to wear knee-high compression stockings and then wear an Ace wrap from her knee to her thigh for the next 3 months.   She will then follow-up with one of our MDs to discuss GSV ablation in the left lower extremity  Shikha Bibb Emmit Alexanders, PA-C [facility]: [phone]  02/06/2023, 10:32 AM  Call MD: Scot Dock

## 2023-02-11 ENCOUNTER — Telehealth: Payer: Self-pay | Admitting: Cardiology

## 2023-02-11 DIAGNOSIS — I480 Paroxysmal atrial fibrillation: Secondary | ICD-10-CM

## 2023-02-11 NOTE — Telephone Encounter (Signed)
*  STAT* If patient is at the pharmacy, call can be transferred to refill team.   1. Which medications need to be refilled? (please list name of each medication and dose if known)   apixaban (ELIQUIS) 2.5 MG TABS tablet    2. Which pharmacy/location (including street and city if local pharmacy) is medication to be sent to?  [place], [place] [address]    3. Do they need a 30 day or 90 day supply? 90 day   Pt stated Pharmacy needs a new coupon card number as well.

## 2023-02-11 NOTE — Telephone Encounter (Signed)
Eliquis 2.5mg  refill request received. Patient is 87 years old, weight-46.8kg, Crea-0.85 on 01/29/23, Diagnosis-Afib, and last seen by Gwyndolyn Kaufman on 01/23/23. Dose is appropriate based on dosing criteria. Will send in refill to requested pharmacy.   Gerald Stabs PharmD Spoke with [facility] and they stated they just delivered the eliquis medication to pt on yesterday and no refill is needed also, was told the coupon system is down so the coupon cannot be processed.

## 2023-02-12 MED ORDER — APIXABAN 2.5 MG PO TABS: 2.5000 mg | ORAL_TABLET | Freq: Two times a day (BID) | ORAL | 5 refills | Status: DC

## 2023-02-13 ENCOUNTER — Other Ambulatory Visit: Payer: Self-pay

## 2023-02-13 DIAGNOSIS — R52 Pain, unspecified: Secondary | ICD-10-CM

## 2023-02-14 NOTE — Progress Notes (Signed)
Office Visit    Patient Name: [name] Date of Encounter: 02/14/2023  Primary Care Provider:  Corliss Blacker, MD Primary Cardiologist:  Freada Bergeron, MD Primary Electrophysiologist: None  Chief Complaint    [name] is a 87 y.o. female with PMH of history of osteoporosis, hyperlipidemia, hypertension, hypothyroidism, breast Ca (lumpectopy/ XRT 2012-3), DVT in 2012, GERD, 4.3cm ascending thoracic aneurysm (previously followed by Dr. Prescott Gum not a candidate for repair due to severe kyphosis), anxiety, paroxysmal atrial fib, chronic diastolic CHF, pulm HTN, AI/MR/TR who presents today for 64-month follow-up.  Past Medical History    Past Medical History:  Diagnosis Date   Anxiety    Aortic regurgitation    Breast cancer ([place]) 08/20/2011   R breast DCIS, ER/PR +   Cataract 3 and 10/92   bilateral   Chronic diastolic CHF (congestive heart failure) (HCC)    Compression fracture of fourth lumbar vertebra (HCC)    DVT (deep venous thrombosis) ([place]) 08/2011   LL extremity    Fibromyalgia    Fracture lumbar vertebra-closed (HCC)    Fracture of thoracic vertebra, closed (HCC)    GERD (gastroesophageal reflux disease)    H/O hiatal hernia    History of blood clots    History of radiation therapy 01/2012   R breast   Hypercholesteremia    Hypertension    DR Orinda Kenner   Hypothyroidism    Mitral regurgitation    Mitral valve prolapse    Osteoporosis    PAF (paroxysmal atrial fibrillation) ([place])    a. dx 11/2016.   Pulmonary hypertension ([place])    Rib fractures    Thoracic ascending aortic aneurysm ([place])    a. followed by Dr. Prescott Gum.   Tricuspid regurgitation    Past Surgical History:  Procedure Laterality Date   ANTERIOR CERVICAL DECOMP/DISCECTOMY FUSION N/A 09/09/2015   Procedure: Anterior Cervical Decompression and Fusion Cervical seven-Thorasic one ;  Surgeon: Erline Levine, MD;  Location: [place] [facility];  Service: Neurosurgery;  Laterality:  N/A;   [place]  10/16/2006   RIH - Dr Hassell Done   KYPHOPLASTY N/A 01/26/2013   Procedure: KYPHOPLASTY;  Surgeon: Kristeen Miss, MD;  Location: [facility];  Service: Neurosurgery;  Laterality: N/A;  T11 and L1   KYPHOPLASTY N/A 06/21/2017   Procedure: Lumbar four Kyphoplasty;  Surgeon: Erline Levine, MD;  Location: [place];  Service: Neurosurgery;  Laterality: N/A;   MASTECTOMY, PARTIAL  10/17/2011   Procedure: MASTECTOMY PARTIAL;  Surgeon: Haywood Lasso, MD;  Location: [place];  Service: General;  Laterality: Right;  needle guided   TEE WITHOUT CARDIOVERSION N/A 12/04/2016   Procedure: TRANSESOPHAGEAL ECHOCARDIOGRAM (TEE);  Surgeon: Pixie Casino, MD;  Location: [facility];  Service: Cardiovascular;  Laterality: N/A;   TONSILLECTOMY  1944    Allergies  Allergies  Allergen Reactions   Contrast Media [Iodinated Contrast Media] Shortness Of Breath   Iohexol Shortness Of Breath   Mucinex [Guaifenesin Er] Other (See Comments)    DIZZINESS   Zanaflex [Tizanidine Hcl] Anxiety and Other (See Comments)    Dizziness   Penicillins Rash    Has patient had a PCN reaction causing immediate rash, facial/tongue/throat swelling, SOB or lightheadedness with hypotension: No Has patient had a PCN reaction causing severe rash involving mucus membranes or skin necrosis:  No Has patient had a PCN reaction that required hospitalization No Has patient had a PCN reaction occurring within the last 10 years: No If all of the above answers are "NO", then may proceed with Cephalosporin use.    Robaxin [Methocarbamol] Diarrhea    History of Present Illness    [name]  is a 87 year old female with the above mention past medical history who presents today for 48-month follow-up of lower extremity swelling.  She was previously followed by Dr. Delton See and establish care with Dr. Shari Prows in 2023. She was  seen in follow-up 12/12/2022 by Dr. Shari Prows for posthospital follow-up.  During visit patient was hesitant to start new medications and did not begin therapy until discussing further.  She also reported worsening lower extremity edema with compliance of Lasix 20 mg daily.  She continued to endorse lightheadedness and struggles with her balance.  She was changed to torsemide 20 mg with potassium added daily.  Losartan was discontinued due to soft blood pressures.  She was started on amiodarone 200 mg daily and continued on Cardizem 180 mg daily, metoprolol 25 mg twice daily, and apixaban 5 mg twice daily.  Patient deemed not a surgical candidate for valve replacement and continued medical management.  She was seen in follow-up 01/09/2023 and reported medication compliance ongoing complaint of tiredness, dizziness and balance problems.  She noted decrease swelling in her right lower extremity but was maintaining swelling in the left lower extremity.  CMET was obtained and diuretics were adjusted to Monday Wednesday and Friday.  She was seen by Dr. Shari Prows on 01/23/2023 for follow-up.  During visit patient's weight has been staying stable and blood pressure was well-controlled.  She was interested and discontinuing torsemide and trying Lasix because she felt this worked better.  She was referred to vein and vascular for possible venous stasis.  Losartan was discontinued due to low BP.  She was seen by vein and vascular on 02/06/2023 and was found to be a candidate for greater saphenous vein ablation.  [name] presents today for 2-week follow-up alone.  Since last being seen in the office patient reports she has experienced good diuresis with her Lasix.  She continues to have swelling in her left lower extremity however her right lower extremity looks much improved since previous visit.  She is able to lay flat while sleeping and denies any shortness of breath with minimal exertion.  She is scheduled to have dental  work completed and will be taking azithromycin prior to her procedure.  Her blood pressures have been well-controlled and currently is 120/64.  She reports having difficulty swallowing her potassium 20 mEq and we will adjust that prescription to 10 mEq twice a day.  She continues to wear compression stockings and has been wrapping her thighs with Ace wraps.  She has some slight weeping in the left lower extremity however right lower extremity is clean and dry to touch.  She continues to experience fatigue and dizziness.  She denies any tingling in her lower extremity or new weakness.  Patient denies chest pain, palpitations, dyspnea, PND, orthopnea, nausea, vomiting, dizziness, syncope, edema, weight gain, or early satiety.    Home Medications    Current Outpatient Medications  Medication Sig Dispense Refill   azithromycin (ZITHROMAX) 250 MG tablet Take 250 mg by mouth as directed.     losartan (COZAAR) 25 MG tablet 1 tablet     acetaminophen (TYLENOL) 325 MG tablet Take 2 tablets (650  mg total) by mouth every 6 (six) hours as needed for moderate pain or mild pain.     amiodarone (PACERONE) 200 MG tablet Take 1 tablet (200 mg total) by mouth daily. 90 tablet 3   apixaban (ELIQUIS) 2.5 MG TABS tablet Take 1 tablet (2.5 mg total) by mouth 2 (two) times daily. 60 tablet 5   Ascorbic Acid (VITA-C PO) Take 1,000 mg by mouth daily.     bisacodyl (DULCOLAX) 5 MG EC tablet Take 1 tablet (5 mg total) by mouth daily as needed for moderate constipation. 30 tablet 1   calcium citrate-vitamin D (CITRACAL+D) 315-200 MG-UNIT per tablet Take 1 tablet by mouth 2 (two) times daily. 60 tablet 11   cholecalciferol (VITAMIN D) 1000 UNITS tablet Take 2,000 Units by mouth every evening. Vitamin D3     diclofenac Sodium (VOLTAREN) 1 % GEL Apply 2 g topically 3 (three) times daily as needed (join pains). 50 g 0   diltiazem (CARDIZEM CD) 180 MG 24 hr capsule Take 1 capsule by mouth daily 30 capsule 1   estradiol (ESTRACE)  0.1 MG/GM vaginal cream Place 1 Applicatorful vaginally once a week.     furosemide (LASIX) 20 MG tablet Take 1 tablet (20 mg total) by mouth daily. 90 tablet 3   HYDROcodone-acetaminophen (NORCO/VICODIN) 5-325 MG tablet Take 1-2 tablets by mouth every 6 (six) hours as needed (mild pain). 20 tablet 0   iron polysaccharides (NIFEREX) 150 MG capsule Take 1 capsule (150 mg total) by mouth 3 (three) times a week. M, W, F 30 capsule 0   levothyroxine (SYNTHROID) 125 MCG tablet Take 1 tablet (125 mcg total) by mouth daily before breakfast. 30 tablet 1   Meclizine HCl (BONINE PO) Take 12.5 mg by mouth daily as needed (dizziness).     metoprolol tartrate (LOPRESSOR) 25 MG tablet Take 1 tablet (25 mg total) by mouth daily. 30 tablet 1   Multiple Vitamin (MULTIVITAMIN) tablet Take 1 tablet by mouth daily with breakfast.     OVER THE COUNTER MEDICATION Take 1,000 mg by mouth daily. D-Mannose 500 mg     pantoprazole (PROTONIX) 40 MG tablet Take 1 tablet (40 mg total) by mouth daily. 30 tablet 6   potassium chloride SA (KLOR-CON M20) 20 MEQ tablet Take 1 tablet (20 mEq total) by mouth daily. 90 tablet 3   pravastatin (PRAVACHOL) 20 MG tablet Take 1 tablet (20 mg total) by mouth daily with supper. 30 tablet 1   risedronate (ACTONEL) 150 MG tablet Take 150 mg by mouth every 30 (thirty) days. with water on empty stomach, nothing by mouth or lie down for next 30 minutes.     senna-docusate (SENOKOT-S) 8.6-50 MG tablet Take 1 tablet by mouth 2 (two) times daily. 60 tablet 1   solifenacin (VESICARE) 10 MG tablet Take 1 tablet (10 mg total) by mouth daily. 30 tablet 1   No current facility-administered medications for this visit.     Review of Systems  Please see the history of present illness.    (+) Left lower extremity edema (+) Shortness of breath with heavy exertion  All other systems reviewed and are otherwise negative except as noted above.  Physical Exam    Wt Readings from Last 3 Encounters:   02/06/23 103 lb 1.6 oz (46.8 kg)  01/23/23 103 lb 6.4 oz (46.9 kg)  01/09/23 98 lb 12.8 oz (44.8 kg)   NW:GNFAO were no vitals filed for this visit.,There is no height or weight on file to  calculate BMI.  Constitutional:      Appearance: Healthy appearance. Not in distress.  Neck:     Vascular: JVD normal.  Pulmonary:     Effort: Pulmonary effort is normal.     Breath sounds: No wheezing. No rales. Diminished in the bases Cardiovascular:     Normal rate. Regular rhythm. Normal S1. Normal S2.      Murmurs:  Edema: 3/6 systolic murmur    Peripheral edema present and greater on the left versus right lower extremity Abdominal:     Palpations: Abdomen is soft non tender. There is no hepatomegaly.  Skin:    General: Skin is warm and dry.  Neurological:     General: No focal deficit present.     Mental Status: Alert and oriented to person, place and time.     Cranial Nerves: Cranial nerves are intact.  EKG/LABS/ Recent Cardiac Studies    ECG personally reviewed by me today -none completed today  Risk Assessment/Calculations:    CHA2DS2-VASc Score = 6   This indicates a 9.7% annual risk of stroke. The patient's score is based upon: CHF History: 1 HTN History: 1 Diabetes History: 0 Stroke History: 0 Vascular Disease History: 1 Age Score: 2 Gender Score: 1           Lab Results  Component Value Date   WBC 10.0 11/29/2022   HGB 14.3 11/29/2022   HCT 39.8 11/29/2022   MCV 85.6 11/29/2022   PLT 235 11/29/2022   Lab Results  Component Value Date   CREATININE 0.85 01/29/2023   BUN 29 (H) 01/29/2023   NA 137 01/29/2023   K 4.2 01/29/2023   CL 98 01/29/2023   CO2 23 01/29/2023   Lab Results  Component Value Date   ALT 21 01/09/2023   AST 24 01/09/2023   ALKPHOS 77 01/09/2023   BILITOT 0.4 01/09/2023   Lab Results  Component Value Date   CHOL  12/02/2010    149        ATP III CLASSIFICATION:  <200     mg/dL   Desirable  409-811  mg/dL   Borderline High   >=914    mg/dL   High          HDL 58 12/02/2010   LDLCALC  12/02/2010    80        Total Cholesterol/HDL:CHD Risk Coronary Heart Disease Risk Table                     Men   Women  1/2 Average Risk   3.4   3.3  Average Risk       5.0   4.4  2 X Average Risk   9.6   7.1  3 X Average Risk  23.4   11.0        Use the calculated Patient Ratio above and the CHD Risk Table to determine the patient's CHD Risk.        ATP III CLASSIFICATION (LDL):  <100     mg/dL   Optimal  782-956  mg/dL   Near or Above                    Optimal  130-159  mg/dL   Borderline  213-086  mg/dL   High  >578     mg/dL   Very High   TRIG 54 46/96/2952   CHOLHDL 2.6 12/02/2010    Lab Results  Component Value Date  HGBA1C  12/01/2010    5.4 (NOTE)                                                                       According to the ADA Clinical Practice Recommendations for 2011, when HbA1c is used as a screening test:   >=6.5%   Diagnostic of Diabetes Mellitus           (if abnormal result  is confirmed)  5.7-6.4%   Increased risk of developing Diabetes Mellitus  References:Diagnosis and Classification of Diabetes Mellitus,Diabetes Care,2011,34(Suppl 1):S62-S69 and Standards of Medical Care in         Diabetes - 2011,Diabetes Care,2011,34  (Suppl 1):S11-S61.    Cardiac Studies & Procedures     STRESS TESTS  MYOCARDIAL PERFUSION IMAGING 01/29/2017  Narrative  The nuclear study is very poor quality due to body habitus  Defect 1: There is a defect present in the basal inferolateral and mid inferolateral location that is most likely artifact due to uptake in structures below the diaphragm.  This is a low risk study.  Nuclear stress EF: 72%. The left ventricular ejection fraction is hyperdynamic (>65%).   ECHOCARDIOGRAM  ECHOCARDIOGRAM COMPLETE 06/05/2022  Narrative ECHOCARDIOGRAM REPORT    Patient Name:   [name] Date of Exam: 06/05/2022 Medical Rec #:  [id number]          Height:        59.0 in Accession #:    [id number]         Weight:       98.8 lb Date of Birth:  [birth year]          BSA:          1.367 m Patient Age:    86 years           BP:           130/90 mmHg Patient Gender: F                  HR:           74 bpm. Exam Location:  [address]  Procedure: 2D Echo, Cardiac Doppler and Color Doppler  Indications:    I34.0 Mitral Valve Insufficiency  History:        Patient has prior history of Echocardiogram examinations, most recent 11/17/2021. CHF, Mitral Valve Prolapse, Mitral Valve Disease, Aortic Valve Disease and Tricuspid Insufficiency, Arrythmias:Atrial Fibrillation; Risk Factors:Hypertension, Dyslipidemia and Family History of Coronary Artery Disease. Ascending Aortic Aneurysm, Right Breast Cancer status post Partial Mastectomy and Radiation.  Sonographer:    Farrel Conners RDCS Referring Phys: Kathlynn Grate [place]  IMPRESSIONS   1. Left ventricular ejection fraction, by estimation, is 60 to 65%. The left ventricle has normal function. The left ventricle has no regional wall motion abnormalities. There is severe asymmetric left ventricular hypertrophy of the basal-septal segment. Left ventricular diastolic parameters are indeterminate. 2. There is systolic anterior motion of the mitral valve but most of MR is related to atrial dilation and bi-leaflet prolapse. 3. Right ventricular systolic function is normal. The right ventricular size is severely enlarged. There is severely elevated pulmonary artery systolic pressure. The estimated right ventricular systolic pressure is 74.9 mmHg. 4. Left atrial size was  severely dilated. 5. Right atrial size was severely dilated. 6. The mitral valve is abnormal. Severe mitral valve regurgitation. 7. Tricuspid valve regurgitation is severe. 8. The aortic valve is tricuspid. There is mild calcification of the aortic valve. Aortic valve regurgitation is mild to moderate. No aortic stenosis is present. 9. Aortic  dilatation noted. There is severe dilatation of the ascending aorta, measuring 50 mm. There is moderate dilatation of the aortic arch, measuring 45 mm.  Comparison(s): Further aortic dilation, worsening tricuspid and mitral regurgitation.  FINDINGS Left Ventricle: Left ventricular ejection fraction, by estimation, is 60 to 65%. The left ventricle has normal function. The left ventricle has no regional wall motion abnormalities. The left ventricular internal cavity size was normal in size. There is severe asymmetric left ventricular hypertrophy of the basal-septal segment. Left ventricular diastolic function could not be evaluated due to mitral regurgitation (moderate or greater). Left ventricular diastolic parameters are indeterminate.  Right Ventricle: The right ventricular size is severely enlarged. No increase in right ventricular wall thickness. Right ventricular systolic function is normal. There is severely elevated pulmonary artery systolic pressure. The tricuspid regurgitant velocity is 3.87 m/s, and with an assumed right atrial pressure of 15 mmHg, the estimated right ventricular systolic pressure is 74.9 mmHg.  Left Atrium: Left atrial size was severely dilated.  Right Atrium: Right atrial size was severely dilated.  Pericardium: There is no evidence of pericardial effusion.  Mitral Valve: The mitral valve is abnormal. Severe mitral valve regurgitation.  Tricuspid Valve: The tricuspid valve is normal in structure. Tricuspid valve regurgitation is severe. No evidence of tricuspid stenosis.  Aortic Valve: The aortic valve is tricuspid. There is mild calcification of the aortic valve. Aortic valve regurgitation is mild to moderate. Aortic regurgitation PHT measures 559 msec. No aortic stenosis is present.  Pulmonic Valve: The pulmonic valve was normal in structure. Pulmonic valve regurgitation is mild. No evidence of pulmonic stenosis.  Aorta: Aortic dilatation noted. There is severe  dilatation of the ascending aorta, measuring 50 mm. There is moderate dilatation of the aortic arch, measuring 45 mm.  IAS/Shunts: No atrial level shunt detected by color flow Doppler.   LEFT VENTRICLE PLAX 2D LVIDd:         5.10 cm   Diastology LVIDs:         2.90 cm   LV e' medial:    5.44 cm/s LV PW:         1.00 cm   LV E/e' medial:  14.8 LV IVS:        1.20 cm   LV e' lateral:   7.77 cm/s LVOT diam:     2.00 cm   LV E/e' lateral: 10.3 LV SV:         78 LV SV Index:   57 LVOT Area:     3.14 cm   RIGHT VENTRICLE RV Basal diam:  4.80 cm RV Mid diam:    3.30 cm RV S prime:     29.20 cm/s TAPSE (M-mode): 3.3 cm  LEFT ATRIUM              Index         RIGHT ATRIUM           Index LA diam:        4.50 cm  3.29 cm/m    RA Area:     35.60 cm LA Vol (A2C):   119.0 ml 87.07 ml/m   RA Volume:   122.00 ml 89.27 ml/m  LA Vol (A4C):   144.0 ml 105.36 ml/m LA Biplane Vol: 132.0 ml 96.58 ml/m AORTIC VALVE LVOT Vmax:   147.80 cm/s LVOT Vmean:  91.060 cm/s LVOT VTI:    0.250 m AI PHT:      559 msec  AORTA Ao Root diam: 4.40 cm Ao Asc diam:  4.95 cm  MITRAL VALVE                 TRICUSPID VALVE MV Area (PHT)  cm           TR Peak grad:   59.9 mmHg MV Decel Time: 237 msec      TR Vmax:        387.00 cm/s MR Peak grad:   129.0 mmHg MR Mean grad:   86.0 mmHg    SHUNTS MR Vmax:        568.00 cm/s  Systemic VTI:  0.25 m MR Vmean:       442.5 cm/s   Systemic Diam: 2.00 cm MR PISA:        9.05 cm MR PISA Radius: 1.20 cm MV E velocity: 80.40 cm/s MV A velocity: 117.50 cm/s MV E/A ratio:  0.68  Riley Lam MD Electronically signed by Riley Lam MD Signature Date/Time: 06/05/2022/11:21:15 AM    Final   TEE  ECHO TEE 12/05/2016  Narrative *[place]* *[facility]* [address] [place], [place] [address] [phone]  ------------------------------------------------------------------- Transesophageal Echocardiography  Patient:     [name], [name] MR #:       [id number] Study Date: 12/04/2016 Gender:     F Age:        44 Height:     149.9 cm Weight:     48.5 kg BSA:        1.43 m^2 Pt. Status: Room:       2W15C  ADMITTING    Maree Krabbe ORDERING     Hillis Range, MD REFERRING    Hillis Range, MD PERFORMING   Zoila Shutter MD ATTENDING    Alison Murray SONOGRAPHER  Lysbeth Galas, RDCS  cc:  ------------------------------------------------------------------- LV EF: 60% -   65%  ------------------------------------------------------------------- Indications:      Mitral regurgitation 424.0.  ------------------------------------------------------------------- History:   PMH:   Dyspnea.  Risk factors:  Hypertension.  ------------------------------------------------------------------- Study Conclusions  - Left ventricle: There was mild concentric hypertrophy. Systolic function was normal. The estimated ejection fraction was in the range of 60% to 65%. Wall motion was normal; there were no regional wall motion abnormalities. - Aortic valve: Sclerosis with mild AI. - Aorta: Dilated. Ascending aortic diameter: 45 mm (S). - Mitral valve: Partially flail P2 segment with moderate, posteriorly directed regurgitation. Effective regurgitant orifice (PISA): 0.33 cm^2. Regurgitant volume (PISA): 40 ml. - Left atrium: Moderately dilated. No evidence of thrombus in the atrial cavity or appendage. - Right atrium: No evidence of thrombus in the atrial cavity or appendage. - Atrial septum: No defect or patent foramen ovale was identified. - Pulmonic valve: No evidence of vegetation.  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 LVEF 60-65%, moderate LAE, no LAA thrombus.  ------------------------------------------------------------------- Study data:   Study status:  Routine.  Consent:  The  risks, benefits, and alternatives to the procedure were explained to the patient and informed consent was obtained.  Procedure:  The patient reported no pain pre or post test. Initial setup. The patient  was brought to the laboratory. Surface ECG leads were monitored. Sedation. Conscious sedation was administered by cardiology staff. Transesophageal echocardiography. An adult multiplane transesophageal probe was inserted by the attending cardiologistwithout difficulty. Image quality was adequate. Intravenous contrast (agitated saline) was administered.  Study completion:  The patient tolerated the procedure well. There were no complications.  Administered medications:   Midazolam, 4mg , IV. Fentanyl, , IV.          Diagnostic transesophageal echocardiography.  2D and color Doppler.  Birthdate:  Patient birthdate: Nov 03, 1936.  Age:  Patient is 87 yr old.  Sex:  Gender: female.    BMI: 21.6 kg/m^2.  Blood pressure:     136/88  Patient status:  Inpatient.  Study date:  Study date: 12/04/2016. Study time: 07:59 AM.  Location:  Endoscopy.  -------------------------------------------------------------------  ------------------------------------------------------------------- Left ventricle:  There was mild concentric hypertrophy. Systolic function was normal. The estimated ejection fraction was in the range of 60% to 65%. Wall motion was normal; there were no regional wall motion abnormalities.  ------------------------------------------------------------------- Aortic valve:  Sclerosis with mild AI.  ------------------------------------------------------------------- Aorta:  Dilated.  ------------------------------------------------------------------- Mitral valve:  Partially flail P2 segment with moderate, posteriorly directed regurgitation.  ------------------------------------------------------------------- Left atrium:  Moderately dilated.  No evidence of thrombus in  the atrial cavity or appendage.  ------------------------------------------------------------------- Atrial septum:  No defect or patent foramen ovale was identified.  ------------------------------------------------------------------- Pulmonary veins:  no anomaly.  ------------------------------------------------------------------- Pulmonic valve:    Structurally normal valve.   Cusp separation was normal.  No evidence of vegetation.  ------------------------------------------------------------------- Tricuspid valve:   Doppler:  There was mild regurgitation.  ------------------------------------------------------------------- Pulmonary artery:   The main pulmonary artery was normal-sized.  ------------------------------------------------------------------- Right atrium:  The atrium was normal in size.  No evidence of thrombus in the atrial cavity or appendage.  ------------------------------------------------------------------- Pericardium:  There was no pericardial effusion.  ------------------------------------------------------------------- Post procedure conclusions Ascending Aorta:  - Dilated.  ------------------------------------------------------------------- Measurements  Aorta                                Value Ascending aorta ID, A-P, S           45    mm  Mitral valve                         Value Mitral regurg VTI, PISA              121   cm Mitral ERO, PISA                     0.33  cm^2 Mitral regurg volume, PISA           40    ml  Tricuspid valve                      Value Tricuspid regurg peak velocity       226   cm/s Tricuspid peak RV-RA gradient        20    mm Hg  Legend: (L)  and  (H)  mark values outside specified reference range.  ------------------------------------------------------------------- Prepared and Electronically Authenticated by  Zoila Shutter MD 2018-01-24T16:46:38            Assessment & Plan      1.  Chronic  diastolic CHF: -Most recent 2D echo completed 05/2022 with EF of 60 to 65%, severe  RV enlargement with preserved RV function, severe MR with mild to moderate AR and severe aortic dilation of 50 mm, severe TR and patient not a surgical candidate due to severe scoliosis. -She was seen by Dr. Shari Prows on 01/23/2023 and since visit patient has noted improved diuresis with Lasix.  She continues to have increased swelling in her left lower extremity versus right.  She has been compliant with her compression stockings and Ace wrap. -She is having difficulty swallowing her potassium 20 mEq and we will switch this to 10 mEq twice daily. -Low sodium diet, fluid restriction <2L, and daily weights encouraged. Educated to contact our office for weight gain of 2 lbs overnight or 5 lbs in one week.    2.  Paroxysmal atrial fibrillation: -Patient recently admitted with AF with RVR and placed on amiodarone.  Today patient reports no recurrence of palpitations or breakthrough tachycardia -Continue amiodarone 200 mg daily, Eliquis 2.5 mg twice daily, Cardizem 180 mg daily -We will complete surveillance labs with TSH, CMET -CHA2DS2-VASc is a 6   3.  Moderate to severe mitral regurgitation: -TTE 05/2022 with EF 60-65%, severe RV enlargement with preserved function, PASP 74, severe MR, mild to moderate AR, severe aortic dilation 41mm, severe TR. Not a surgical candidate. On medical therapy.     4.  Thoracic aortic aneurysm: -Patient was previously followed by Dr. Maren Beach and currently managed conservatively due to not being a surgical candidate  5.  Bilateral leg edema: -Patient continues to have lower extremity swelling with left greater than right extremity.  She was advised to continue her compression stockings and wraps with her Ace bandage. -Patient was referred to [facility] and found to be a candidate for saphenous vein ablation -She has a follow-up planned for July 1  6.  Hiatal hernia: -Patient requested for  pantoprazole to be discontinued and would like to retry omeprazole -She reports no change in her symptoms with pantoprazole. -Stop pantoprazole 20 mg and start omeprazole 20 mg today.   Disposition: Follow-up with Meriam Sprague, MD or APP in 3 months     Medication Adjustments/Labs and Tests Ordered: Current medicines are reviewed at length with the patient today.  Concerns regarding medicines are outlined above.   Signed, Napoleon Form, Leodis Rains, NP 02/14/2023, 8:29 AM [facility]  Note:  This document was prepared using Dragon voice recognition software and may include unintentional dictation errors.

## 2023-02-15 ENCOUNTER — Ambulatory Visit: Payer: Medicare Other | Attending: Nurse Practitioner | Admitting: Nurse Practitioner

## 2023-02-15 ENCOUNTER — Encounter: Payer: Self-pay | Admitting: Nurse Practitioner

## 2023-02-15 VITALS — BP 120/64 | HR 50 | Ht 59.0 in | Wt 103.6 lb

## 2023-02-15 DIAGNOSIS — I5032 Chronic diastolic (congestive) heart failure: Secondary | ICD-10-CM | POA: Insufficient documentation

## 2023-02-15 DIAGNOSIS — I340 Nonrheumatic mitral (valve) insufficiency: Secondary | ICD-10-CM | POA: Insufficient documentation

## 2023-02-15 DIAGNOSIS — I480 Paroxysmal atrial fibrillation: Secondary | ICD-10-CM | POA: Insufficient documentation

## 2023-02-15 DIAGNOSIS — I7121 Aneurysm of the ascending aorta, without rupture: Secondary | ICD-10-CM | POA: Diagnosis not present

## 2023-02-15 DIAGNOSIS — K449 Diaphragmatic hernia without obstruction or gangrene: Secondary | ICD-10-CM | POA: Diagnosis not present

## 2023-02-15 DIAGNOSIS — R600 Localized edema: Secondary | ICD-10-CM | POA: Insufficient documentation

## 2023-02-15 MED ORDER — POTASSIUM CHLORIDE ER 10 MEQ PO TBCR: 10.0000 meq | EXTENDED_RELEASE_TABLET | Freq: Two times a day (BID) | ORAL | 3 refills | Status: DC

## 2023-02-15 MED ORDER — OMEPRAZOLE 20 MG PO CPDR: 20.0000 mg | DELAYED_RELEASE_CAPSULE | Freq: Every day | ORAL | 11 refills | Status: AC

## 2023-02-15 NOTE — Patient Instructions (Addendum)
Medication Instructions:  Your physician has recommended you make the following change in your medication:  TAKE POTASSIUM 10 MG TWICE A DAY - TAKE SAME AT THE SAME TIME AS LASIX  STOP PANTOPRAZOLE  RESTART OMEPRAZOLE  20 MG DAILY *If you need a refill on your cardiac medications before your next appointment, please call your pharmacy*   Lab Work: TODAY: BMET If you have labs (blood work) drawn today and your tests are completely normal, you will receive your results only by: MyChart Message (if you have MyChart) OR A paper copy in the mail If you have any lab test that is abnormal or we need to change your treatment, we will call you to review the results.   Testing/Procedures: NONE   Follow-Up: At [facility], you and your health needs are our priority.  As part of our continuing mission to provide you with exceptional heart care, we have created designated Provider Care Teams.  These Care Teams include your primary Cardiologist (physician) and Advanced Practice Providers (APPs -  Physician Assistants and Nurse Practitioners) who all work together to provide you with the care you need, when you need it.  We recommend signing up for the patient portal called "MyChart".  Sign up information is provided on this After Visit Summary.  MyChart is used to connect with patients for Virtual Visits (Telemedicine).  Patients are able to view lab/test results, encounter notes, upcoming appointments, etc.  Non-urgent messages can be sent to your provider as well.   To learn more about what you can do with MyChart, go to [email or web address].    Your next appointment:   3 month(s)  Provider:   Robin SearingErnest Dick, NP      Other Instructions YOUR PROVIDER RECOMMENDS THAT YOU MAINTAIN 64 OZ OF WATER DAILY.   MAKE SURE TO WEAR YOU SUPPORT HOSE AND WRAP ACE BANDAGE   Two Gram Sodium Diet 2000 mg  What is Sodium? Sodium is a mineral found naturally in many foods. The most significant  source of sodium in the diet is table salt, which is about 40% sodium.  Processed, convenience, and preserved foods also contain a large amount of sodium.  The body needs only 500 mg of sodium daily to function,  A normal diet provides more than enough sodium even if you do not use salt.  Why Limit Sodium? A build up of sodium in the body can cause thirst, increased blood pressure, shortness of breath, and water retention.  Decreasing sodium in the diet can reduce edema and risk of heart attack or stroke associated with high blood pressure.  Keep in mind that there are many other factors involved in these health problems.  Heredity, obesity, lack of exercise, cigarette smoking, stress and what you eat all play a role.  General Guidelines: Do not add salt at the table or in cooking.  One teaspoon of salt contains over 2 grams of sodium. Read food labels Avoid processed and convenience foods Ask your dietitian before eating any foods not dicussed in the menu planning guidelines Consult your physician if you wish to use a salt substitute or a sodium containing medication such as antacids.  Limit milk and milk products to 16 oz (2 cups) per day.  Shopping Hints: READ LABELS!! "Dietetic" does not necessarily mean low sodium. Salt and other sodium ingredients are often added to foods during processing.    Menu Planning Guidelines Food Group Choose More Often Avoid  Beverages (see also the milk group All  fruit juices, low-sodium, salt-free vegetables juices, low-sodium carbonated beverages Regular vegetable or tomato juices, commercially softened water used for drinking or cooking  Breads and Cereals Enriched white, wheat, rye and pumpernickel bread, hard rolls and dinner rolls; muffins, cornbread and waffles; most dry cereals, cooked cereal without added salt; unsalted crackers and breadsticks; low sodium or homemade bread crumbs Bread, rolls and crackers with salted tops; quick breads; instant hot  cereals; pancakes; commercial bread stuffing; self-rising flower and biscuit mixes; regular bread crumbs or cracker crumbs  Desserts and Sweets Desserts and sweets mad with mild should be within allowance Instant pudding mixes and cake mixes  Fats Butter or margarine; vegetable oils; unsalted salad dressings, regular salad dressings limited to 1 Tbs; light, sour and heavy cream Regular salad dressings containing bacon fat, bacon bits, and salt pork; snack dips made with instant soup mixes or processed cheese; salted nuts  Fruits Most fresh, frozen and canned fruits Fruits processed with salt or sodium-containing ingredient (some dried fruits are processed with sodium sulfites        Vegetables Fresh, frozen vegetables and low- sodium canned vegetables Regular canned vegetables, sauerkraut, pickled vegetables, and others prepared in brine; frozen vegetables in sauces; vegetables seasoned with ham, bacon or salt pork  Condiments, Sauces, Miscellaneous  Salt substitute with physician's approval; pepper, herbs, spices; vinegar, lemon or lime juice; hot pepper sauce; garlic powder, onion powder, low sodium soy sauce (1 Tbs.); low sodium condiments (ketchup, chili sauce, mustard) in limited amounts (1 tsp.) fresh ground horseradish; unsalted tortilla chips, pretzels, potato chips, popcorn, salsa (1/4 cup) Any seasoning made with salt including garlic salt, celery salt, onion salt, and seasoned salt; sea salt, rock salt, kosher salt; meat tenderizers; monosodium glutamate; mustard, regular soy sauce, barbecue, sauce, chili sauce, teriyaki sauce, steak sauce, Worcestershire sauce, and most flavored vinegars; canned gravy and mixes; regular condiments; salted snack foods, olives, picles, relish, horseradish sauce, catsup   Food preparation: Try these seasonings Meats:    Pork Sage, onion Serve with applesauce  Chicken Poultry seasoning, thyme, parsley Serve with cranberry sauce  Lamb Curry powder, rosemary,  garlic, thyme Serve with mint sauce or jelly  Veal Marjoram, basil Serve with current jelly, cranberry sauce  Beef Pepper, bay leaf Serve with dry mustard, unsalted chive butter  Fish [place] leaf, dill Serve with unsalted lemon butter, unsalted parsley butter  Vegetables:    Asparagus Lemon juice   Broccoli Lemon juice   Carrots Mustard dressing parsley, mint, nutmeg, glazed with unsalted butter and sugar   Green beans Marjoram, lemon juice, nutmeg,dill seed   Tomatoes Basil, marjoram, onion   Spice /blend for [employer]" 4 tsp ground thyme 1 tsp ground sage 3 tsp ground rosemary 4 tsp ground marjoram   Test your knowledge A product that says "Salt Free" may still contain sodium. True or False Garlic Powder and Hot Pepper Sauce an be used as alternative seasonings.True or False Processed foods have more sodium than fresh foods.  True or False Canned Vegetables have less sodium than froze True or False   WAYS TO DECREASE YOUR SODIUM INTAKE Avoid the use of added salt in cooking and at the table.  Table salt (and other prepared seasonings which contain salt) is probably one of the greatest sources of sodium in the diet.  Unsalted foods can gain flavor from the sweet, sour, and butter taste sensations of herbs and spices.  Instead of using salt for seasoning, try the following seasonings with the foods listed.  Remember: how you use them to enhance natural food flavors is limited only by your creativity... Allspice-Meat, fish, eggs, fruit, peas, red and yellow vegetables Almond Extract-Fruit baked goods Anise Seed-Sweet breads, fruit, carrots, beets, cottage cheese, cookies (tastes like licorice) Basil-Meat, fish, eggs, vegetables, rice, vegetables salads, soups, sauces Bay Leaf-Meat, fish, stews, poultry Burnet-Salad, vegetables (cucumber-like flavor) Caraway Seed-Bread, cookies, cottage cheese, meat, vegetables, cheese, rice Cardamon-Baked goods, fruit, soups Celery Powder or  seed-Salads, salad dressings, sauces, meatloaf, soup, bread.Do not use  celery salt Chervil-Meats, salads, fish, eggs, vegetables, cottage cheese (parsley-like flavor) Chili Power-Meatloaf, chicken cheese, corn, eggplant, egg dishes Chives-Salads cottage cheese, egg dishes, soups, vegetables, sauces Cilantro-Salsa, casseroles Cinnamon-Baked goods, fruit, pork, lamb, chicken, carrots Cloves-Fruit, baked goods, fish, pot roast, green beans, beets, carrots Coriander-Pastry, cookies, meat, salads, cheese (lemon-orange flavor) Cumin-Meatloaf, fish,cheese, eggs, cabbage,fruit pie (caraway flavor) [employer], fruit, eggs, fish, poultry, cottage cheese, vegetables Dill Seed-Meat, cottage cheese, poultry, vegetables, fish, salads, bread Fennel Seed-Bread, cookies, apples, pork, eggs, fish, beets, cabbage, cheese, Licorice-like flavor Garlic-(buds or powder) Salads, meat, poultry, fish, bread, butter, vegetables, potatoes.Do not  use garlic salt Ginger-Fruit, vegetables, baked goods, meat, fish, poultry Horseradish Root-Meet, vegetables, butter Lemon Juice or Extract-Vegetables, fruit, tea, baked goods, fish salads Mace-Baked goods fruit, vegetables, fish, poultry (taste like nutmeg) Maple Extract-Syrups Marjoram-Meat, chicken, fish, vegetables, breads, green salads (taste like Sage) Mint-Tea, lamb, sherbet, vegetables, desserts, carrots, cabbage Mustard, Dry or Seed-Cheese, eggs, meats, vegetables, poultry Nutmeg-Baked goods, fruit, chicken, eggs, vegetables, desserts Onion Powder-Meat, fish, poultry, vegetables, cheese, eggs, bread, rice salads (Do not use   Onion salt) Orange Extract-Desserts, baked goods Oregano-Pasta, eggs, cheese, onions, pork, lamb, fish, chicken, vegetables, green salads Paprika-Meat, fish, poultry, eggs, cheese, vegetables Parsley Flakes-Butter, vegetables, meat fish, poultry, eggs, bread, salads (certain forms may   Contain sodium Pepper-Meat fish, poultry,  vegetables, eggs Peppermint Extract-Desserts, baked goods Poppy Seed-Eggs, bread, cheese, fruit dressings, baked goods, noodles, vegetables, cottage  [employer], poultry, meat, fish, cauliflower, turnips,eggs bread Saffron-Rice, bread, veal, chicken, fish, eggs Sage-Meat, fish, poultry, onions, eggplant, tomateos, pork, stews Savory-Eggs, salads, poultry, meat, rice, vegetables, soups, pork Tarragon-Meat, poultry, fish, eggs, butter, vegetables (licorice-like flavor)  Thyme-Meat, poultry, fish, eggs, vegetables, (clover-like flavor), sauces, soups Tumeric-Salads, butter, eggs, fish, rice, vegetables (saffron-like flavor) Vanilla Extract-Baked goods, candy Vinegar-Salads, vegetables, meat marinades Walnut Extract-baked goods, candy   2. Choose your Foods Wisely   The following is a list of foods to avoid which are high in sodium:  Meats-Avoid all smoked, canned, salt cured, dried and kosher meat and fish as well as Anchovies   [employer] meats:Bologna, Liverwurst, Pastrami Canned meat or fish  Marinated herring Caviar    Pepperoni Corned Beef   Pizza Dried chipped beef  Salami Frozen breaded fish or meat Salt pork Frankfurters or hot dogs  Sardines Gefilte fish   Sausage Ham (boiled ham, Proscuitto Smoked butt    spiced ham)   Spam      TV Dinners Vegetables Canned vegetables (Regular) Relish Canned mushrooms  Sauerkraut Olives    Tomato juice Pickles  Bakery and Dessert Products Canned puddings  Cream pies Cheesecake   Decorated cakes Cookies  Beverages/Juices Tomato juice, regular  Gatorade   V-8 vegetable juice, regular  Breads and Cereals Biscuit mixes   Salted potato chips, corn chips, pretzels Bread stuffing mixes  Salted crackers and rolls Pancake and waffle mixes Self-rising flour  Seasonings Accent    Meat sauces Barbecue sauce  Meat tenderizer  Catsup    Monosodium glutamate (MSG) Celery salt   Onion  salt Chili sauce   Prepared mustard Garlic salt   Salt, seasoned salt, sea salt Gravy mixes   Soy sauce Horseradish   Steak sauce Ketchup   Tartar sauce Lite salt    Teriyaki sauce Marinade mixes   Worcestershire sauce  Others Baking powder   Cocoa and cocoa mixes Baking soda   Commercial casserole mixes Candy-caramels, chocolate  Dehydrated soups    Bars, fudge,nougats  Instant rice and pasta mixes Canned broth or soup  Maraschino cherries Cheese, aged and processed cheese and cheese spreads  Learning Assessment Quiz  Indicated T (for True) or F (for False) for each of the following statements:  _____ Fresh fruits and vegetables and unprocessed grains are generally low in sodium _____ Water may contain a considerable amount of sodium, depending on the source _____ You can always tell if a food is high in sodium by tasting it _____ Certain laxatives my be high in sodium and should be avoided unless prescribed   by a physician or pharmacist _____ Salt substitutes may be used freely by anyone on a sodium restricted diet _____ Sodium is present in table salt, food additives and as a natural component of   most foods _____ Table salt is approximately 90% sodium _____ Limiting sodium intake may help prevent excess fluid accumulation in the body _____ On a sodium-restricted diet, seasonings such as bouillon soy sauce, and    cooking wine should be used in place of table salt _____ On an ingredient list, a product which lists monosodium glutamate as the first   ingredient is an appropriate food to include on a low sodium diet  Circle the best answer(s) to the following statements (Hint: there may be more than one correct answer)  11. On a low-sodium diet, some acceptable snack items are:    A. Olives  F. Bean dip   K. Grapefruit juice    B. Salted Pretzels G. Commercial Popcorn   L. Canned peaches    C. Carrot Sticks  H. Bouillon   M. Unsalted nuts   D. [place] fries  I. Peanut  butter crackers N. Salami   E. Sweet pickles J. Tomato Juice   O. Pizza  12.  Seasonings that may be used freely on a reduced - sodium diet include   A. Lemon wedges F.Monosodium glutamate K. Celery seed    B.Soysauce   G. Pepper   L. Mustard powder   C. Sea salt  H. Cooking wine  M. Onion flakes   D. Vinegar  E. Prepared horseradish N. Salsa   E. Sage   J. Worcestershire sauce  O. Chutney

## 2023-02-16 LAB — BASIC METABOLIC PANEL
BUN/Creatinine Ratio: 39 — ABNORMAL HIGH (ref 12–28)
BUN: 32 mg/dL — ABNORMAL HIGH (ref 8–27)
CO2: 26 mmol/L (ref 20–29)
Calcium: 10 mg/dL (ref 8.7–10.3)
Chloride: 100 mmol/L (ref 96–106)
Creatinine, Ser: 0.83 mg/dL (ref 0.57–1.00)
Glucose: 107 mg/dL — ABNORMAL HIGH (ref 70–99)
Potassium: 4.1 mmol/L (ref 3.5–5.2)
Sodium: 137 mmol/L (ref 134–144)
eGFR: 69 mL/min/{1.73_m2} (ref >59)

## 2023-02-18 ENCOUNTER — Telehealth: Payer: Self-pay | Admitting: Cardiology

## 2023-02-18 NOTE — Telephone Encounter (Signed)
Patient is requesting call back to get update on labs.

## 2023-02-19 ENCOUNTER — Telehealth: Payer: Self-pay | Admitting: Nurse Practitioner

## 2023-02-19 DIAGNOSIS — N3281 Overactive bladder: Secondary | ICD-10-CM | POA: Diagnosis not present

## 2023-02-19 DIAGNOSIS — R3121 Asymptomatic microscopic hematuria: Secondary | ICD-10-CM | POA: Diagnosis not present

## 2023-02-19 NOTE — Telephone Encounter (Signed)
Patient stated during her last OV it was discussed if her lab results are WNL, then she can take lasix and potassium M-W-F. Patient wanted to clarify that she is to continue taking lasix and potassium every day even though her lab results are normal. Will forward to APP.

## 2023-02-19 NOTE — Telephone Encounter (Signed)
Patient is called stating she has questions about her lab results.

## 2023-02-19 NOTE — Telephone Encounter (Signed)
Spoke with the patient and explained APP recommendations:  Yes she is fine to take lasix and potassium on Monday, Wednesday, and Friday. She needs to continue to check weights and monitor fluid intake   Patient voiced understanding

## 2023-02-19 NOTE — Telephone Encounter (Signed)
Left detailed message with results, and advised the patient to call back if any questions.

## 2023-03-04 DIAGNOSIS — I872 Venous insufficiency (chronic) (peripheral): Secondary | ICD-10-CM | POA: Diagnosis not present

## 2023-03-04 DIAGNOSIS — I8311 Varicose veins of right lower extremity with inflammation: Secondary | ICD-10-CM | POA: Diagnosis not present

## 2023-03-04 DIAGNOSIS — I8312 Varicose veins of left lower extremity with inflammation: Secondary | ICD-10-CM | POA: Diagnosis not present

## 2023-03-04 DIAGNOSIS — B351 Tinea unguium: Secondary | ICD-10-CM | POA: Diagnosis not present

## 2023-03-04 DIAGNOSIS — Z85828 Personal history of other malignant neoplasm of skin: Secondary | ICD-10-CM | POA: Diagnosis not present

## 2023-03-04 DIAGNOSIS — D485 Neoplasm of uncertain behavior of skin: Secondary | ICD-10-CM | POA: Diagnosis not present

## 2023-03-05 DIAGNOSIS — I480 Paroxysmal atrial fibrillation: Secondary | ICD-10-CM | POA: Diagnosis not present

## 2023-03-05 DIAGNOSIS — I872 Venous insufficiency (chronic) (peripheral): Secondary | ICD-10-CM | POA: Diagnosis not present

## 2023-03-05 DIAGNOSIS — M810 Age-related osteoporosis without current pathological fracture: Secondary | ICD-10-CM | POA: Diagnosis not present

## 2023-03-05 DIAGNOSIS — I340 Nonrheumatic mitral (valve) insufficiency: Secondary | ICD-10-CM | POA: Diagnosis not present

## 2023-03-05 DIAGNOSIS — E782 Mixed hyperlipidemia: Secondary | ICD-10-CM | POA: Diagnosis not present

## 2023-03-05 DIAGNOSIS — Z86718 Personal history of other venous thrombosis and embolism: Secondary | ICD-10-CM | POA: Diagnosis not present

## 2023-03-05 DIAGNOSIS — E039 Hypothyroidism, unspecified: Secondary | ICD-10-CM | POA: Diagnosis not present

## 2023-03-05 DIAGNOSIS — I10 Essential (primary) hypertension: Secondary | ICD-10-CM | POA: Diagnosis not present

## 2023-03-07 ENCOUNTER — Telehealth: Payer: Self-pay | Admitting: Cardiology

## 2023-03-07 NOTE — Telephone Encounter (Signed)
Pt is calling into the office to get an appt with Dr. Shari Prows or an APP for this week, to get headway on her fluid levels.   In the recent past, we've had to bring the pt into the office quite frequently for fluid issues/cardiac complaints/med management.  Dr. Shari Prows has been treating her on a symptomatic basis, due to pt not being a surgical candidate due to extreme kyphosis, per TCTS.   Pt has been admitted this year for volume overload issues.   Pt states her lower extremity edema in the last several days has worsened, bilaterally.  She states the right leg is worse than the left leg, but worsening edema noted in both extremities.   Pt states she does have worsening sob with exertion during this timeframe.  She states she is not sob at rest.     Pt denies any orthopnea, chest pain, diaphoresis, N/V, pre-syncope or syncope.  She did state she is more lightheaded and feeling off balance, however her kyphosis limits how well she ambulates.    Pt does dry weigh herself daily and here are last several recordings:  4/18- 104 lbs   4/24- 105 lbs  and today 4/25-105.3 lbs.  Last weight here in the office was 103 lbs.   Pt confirmed with me that she is taking all her meds as prescribed as well as her lasix and potassium regimen.  She maintains a low sodium diet.  She also has a private caregiver/aid that is with her in the home everyday during the day.  Pt is concerned about her worsening edema and worsening sob with exertion.  She states she probably needs her lasix dose readjusted.   Pt will be seeing Dr. Myra Gianotti at [facility] on [facility] with ABI's.  She was referred to him at last OV with Robin Searing NP.  Scheduled the pt to see Gillian Shields NP for tomorrow 4/26 at 1120 at [facility] location.  Pt is familiar with this location and just saw Dr. Shari Prows at the last OV with her at [facility].  Pt will have caregiver bring her to this appt.  She will bring her weight recordings and any other V/S recordings to the  visit tomorrow.   Advised the pt in the meantime between now and her appt with [facility] tomorrow, she should continue her current regimen, maintain low sodium diet, wear her compressions, and elevate her extremities at rest.  Advised her not to over exert herself.  ED precautions provided to the pt if sob/symptoms worsen between now and the appt tomorrow.   Pt verbalized understanding and agrees with this plan.    Will send this message to Dr. Shari Prows to make her aware of this plan.

## 2023-03-07 NOTE — Telephone Encounter (Signed)
Linette, Gunderson - 03/07/2023 10:48 AM Meriam Sprague, MD  Sent: Thu March 07, 2023 12:04 PM  To: Loa Socks, LPN         Message  Thank you so much!

## 2023-03-07 NOTE — Telephone Encounter (Signed)
Pt c/o swelling: STAT is pt has developed SOB within 24 hours  How much weight have you gained and in what time span? It goes up but then come back down   If swelling, where is the swelling located? Leg (stated that the swelling is worse)  Are you currently taking a fluid pill? yes  Are you currently SOB? no  Do you have a log of your daily weights (if so, list)?    Have you gained 3 pounds in a day or 5 pounds in a week?    Have you traveled recently? No   Balance is still off along with lightheadedness

## 2023-03-08 ENCOUNTER — Ambulatory Visit (INDEPENDENT_AMBULATORY_CARE_PROVIDER_SITE_OTHER): Payer: Medicare Other | Admitting: Family

## 2023-03-08 ENCOUNTER — Ambulatory Visit (HOSPITAL_BASED_OUTPATIENT_CLINIC_OR_DEPARTMENT_OTHER): Payer: Medicare Other | Admitting: Family

## 2023-03-08 ENCOUNTER — Encounter (HOSPITAL_BASED_OUTPATIENT_CLINIC_OR_DEPARTMENT_OTHER): Payer: Self-pay | Admitting: Family

## 2023-03-08 VITALS — BP 122/80 | HR 47 | Ht 59.0 in | Wt 106.3 lb

## 2023-03-08 DIAGNOSIS — I5032 Chronic diastolic (congestive) heart failure: Secondary | ICD-10-CM | POA: Diagnosis not present

## 2023-03-08 DIAGNOSIS — R600 Localized edema: Secondary | ICD-10-CM | POA: Diagnosis not present

## 2023-03-08 DIAGNOSIS — I480 Paroxysmal atrial fibrillation: Secondary | ICD-10-CM | POA: Diagnosis not present

## 2023-03-08 DIAGNOSIS — I340 Nonrheumatic mitral (valve) insufficiency: Secondary | ICD-10-CM | POA: Diagnosis not present

## 2023-03-08 DIAGNOSIS — E876 Hypokalemia: Secondary | ICD-10-CM | POA: Diagnosis not present

## 2023-03-08 DIAGNOSIS — D6859 Other primary thrombophilia: Secondary | ICD-10-CM

## 2023-03-08 DIAGNOSIS — I878 Other specified disorders of veins: Secondary | ICD-10-CM | POA: Diagnosis not present

## 2023-03-08 DIAGNOSIS — E782 Mixed hyperlipidemia: Secondary | ICD-10-CM | POA: Diagnosis not present

## 2023-03-08 MED ORDER — SPIRONOLACTONE 25 MG PO TABS: 12.5000 mg | ORAL_TABLET | Freq: Every day | ORAL | 1 refills | Status: DC

## 2023-03-08 MED ORDER — DILTIAZEM HCL ER COATED BEADS 120 MG PO CP24: 120.0000 mg | ORAL_CAPSULE | Freq: Every day | ORAL | 3 refills | Status: AC

## 2023-03-08 MED ORDER — FUROSEMIDE 20 MG PO TABS: ORAL_TABLET | ORAL | 3 refills | Status: DC

## 2023-03-08 NOTE — Addendum Note (Signed)
Addended by: Marlene Lard on: 03/08/2023 01:34 PM   Modules accepted: Orders

## 2023-03-08 NOTE — Progress Notes (Signed)
Office Visit    Patient Name: [name] Date of Encounter: 03/08/2023  PCP:  Annette Housekeeper, MD   [facility]  Cardiologist:  Annette Sprague, MD  Advanced Practice Provider:  No care team member to display Electrophysiologist:  None      Chief Complaint    [name] is a 87 y.o. female presents today for lower extremity edema  Past Medical History    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/20/2011   R breast DCIS, ER/PR +   Cataract 3 and 10/92   bilateral   Chronic diastolic CHF (congestive heart failure) (HCC)    Compression fracture of fourth lumbar vertebra (HCC)    DVT (deep venous thrombosis) (HCC) 08/2011   LL extremity    Fibromyalgia    Fracture lumbar vertebra-closed (HCC)    Fracture of thoracic vertebra, closed (HCC)    GERD (gastroesophageal reflux disease)    H/O hiatal hernia    History of blood clots    History of radiation therapy 01/2012   R breast   Hypercholesteremia    Hypertension    DR [name]   Hypothyroidism    Mitral regurgitation    Mitral valve prolapse    Osteoporosis    PAF (paroxysmal atrial fibrillation) (HCC)    a. dx 11/2016.   Pulmonary hypertension (HCC)    Rib fractures    Thoracic ascending aortic aneurysm (HCC)    a. followed by Dr. Donata [name].   Tricuspid regurgitation    Past Surgical History:  Procedure Laterality Date   ANTERIOR CERVICAL DECOMP/DISCECTOMY FUSION N/A 09/09/2015   Procedure: Anterior Cervical Decompression and Fusion Cervical seven-Thorasic one ;  Surgeon: Annette Harman, MD;  Location: [facility];  Service: Neurosurgery;  Laterality: N/A;   APPENDECTOMY  1940   BREAST SURGERY     CATARACT EXTRACTION  1992   EYE SURGERY  1940, 1956   HERNIA REPAIR  10/16/2006   [facility] - Dr [name]   KYPHOPLASTY N/A 01/26/2013   Procedure: KYPHOPLASTY;  Surgeon: Annette Abu, MD;  Location: [facility];  Service:  Neurosurgery;  Laterality: N/A;  T11 and L1   KYPHOPLASTY N/A 06/21/2017   Procedure: Lumbar four Kyphoplasty;  Surgeon: Annette Harman, MD;  Location: [facility] OR;  Service: Neurosurgery;  Laterality: N/A;   MASTECTOMY, PARTIAL  10/17/2011   Procedure: MASTECTOMY PARTIAL;  Surgeon: Annette Paris, MD;  Location: [facility] OR;  Service: General;  Laterality: Right;  needle guided   TEE WITHOUT CARDIOVERSION N/A 12/04/2016   Procedure: TRANSESOPHAGEAL ECHOCARDIOGRAM (TEE);  Surgeon: Annette Nose, MD;  Location: [facility] ENDOSCOPY;  Service: Cardiovascular;  Laterality: N/A;   TONSILLECTOMY  1944    Allergies  Allergies  Allergen Reactions   Contrast Media [Iodinated Contrast Media] Shortness Of Breath   Iohexol Shortness Of Breath   Mucinex [Guaifenesin Er] Other ([name] Comments)    DIZZINESS   Zanaflex [Tizanidine Hcl] Anxiety and Other ([name] Comments)    Dizziness   Penicillins Rash    Has patient had a PCN reaction causing immediate rash, facial/tongue/throat swelling, SOB or lightheadedness with hypotension: No Has patient had a PCN reaction causing severe rash involving mucus membranes or skin necrosis: No Has patient had a PCN reaction that required hospitalization No Has patient had a PCN reaction occurring within the last 10 years: No If all of the above  answers are "NO", then may proceed with Cephalosporin use.    Robaxin [Methocarbamol] Diarrhea    History of Present Illness    [name] is a 87 y.o. female with a hx of chronic diastolic heart failure, severe MR (not surgical candidate), atrial fibrillation, thoracic aortic aneurysm, hiatal hernia last seen 02/15/23 by Annette Searing, NP  Previously followed with Dr. Delton [name] has since established with Dr. Shari [name].  TTE 11/2016 LVEF 55 to 60%, mild to moderate AI, moderate MR, severe atrial enlargement, severe TR.  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  Known paroxysmal  atrial fibrillation s/p spontaneous conversion to NSR on diltiazem and flecainide.  Myoview 01/2017 poor quality but low risk with EF 72%.  Echo 07/2018 EF 55 to 60%, grade 1 diastolic dysfunction, mildly dilated ascending aorta with moderate MVP with MR and moderate to severe dilated LA, moderate TR, moderate pulmonary hypertension.  Evaluated Dr. Excell [name] 06/05/2021 deemed best for medical management given relative lack of symptoms and comorbidities. 05/2022 LVEF 60 to 65%, severe RV enlargement with preserved RV function, severe MR with mild to moderate AR and severe aortic dilation 50 mm, severe TR.  Not surgical candidate due to severe scoliosis.  Admitted 11/2022 for acute on chronic diastolic heart failure exacerbation and atrial fibrillation with RVR.  Losartan discontinued during that admission due to soft blood pressure.  She was diuresed and placed on p.o. amiodarone in addition to apixaban, metoprolol, diltiazem.  Previously on torsemide but felt Lasix worked better and was transition back to Lasix 01/23/2023.  Previously referred to [facility] and found to be candidate for saphenous vein ablation with follow-up scheduled 05/13/2023 to discuss with MD.  At visit 03/07/2023 noted good diuresis with Lasix.  Potassium addition to 10 mill equivalent twice per day instead of larger potassium 20 mill equivalent tablet.  Small amount of weeping noted in LLE for RLE improved.  She requested to transition from Protonix to omeprazole.  She contacted the office yesterday noting worsening lower extremity edema over several days right greater than left, worsening exertional dyspnea.  Presents today for follow-up. Tells me she went to dermatologist regarding fungus on her toenail and he tole her he was concerned about her swelling. Notes more swelling particularly to the right leg for about a week. Still notes good urinary output with Furosemide. Presently taking Furosemide, Potassium M, W, F for the last week. BP at home  110s/60s. Notes feeling lightheaded when she is up moving if she does not have her [name]. This has been ongoing for a couple months. Not described  as spinning. No near syncope, syncope. Weight at home as low as 102 over the last month. Tells me when her legs feel the best right after she takes her Hydrocodone in the morning at breakfast. This medication seems to help with her tingling/numbness when she first gets.   EKGs/Labs/Other Studies Reviewed:   The following studies were reviewed today: Cardiac Studies & Procedures     STRESS TESTS  MYOCARDIAL PERFUSION IMAGING 01/29/2017  Narrative  The nuclear study is very poor quality due to body habitus  Defect 1: There is a defect present in the basal inferolateral and mid inferolateral location that is most likely artifact due to uptake in structures below the diaphragm.  This is a low risk study.  Nuclear stress EF: 72%. The left ventricular ejection fraction is hyperdynamic (>65%).   ECHOCARDIOGRAM  ECHOCARDIOGRAM  COMPLETE 06/05/2022  Narrative ECHOCARDIOGRAM REPORT    Patient Name:   Erisa Mehlman Date of Exam: 06/05/2022 Medical Rec #:  [id number]          Height:       59.0 in Accession #:    [id number]         Weight:       98.8 lb Date of Birth:  1936-09-06          BSA:          1.367 m Patient Age:    86 years           BP:           130/90 mmHg Patient Gender: F                  HR:           74 bpm. Exam Location:  [address]  Procedure: 2D Echo, Cardiac Doppler and Color Doppler  Indications:    I34.0 Mitral Valve Insufficiency  History:        Patient has prior history of Echocardiogram examinations, most recent 11/17/2021. CHF, Mitral Valve Prolapse, Mitral Valve Disease, Aortic Valve Disease and Tricuspid Insufficiency, Arrythmias:Atrial Fibrillation; Risk Factors:Hypertension, Dyslipidemia and Family History of Coronary Artery Disease. Ascending Aortic Aneurysm, Right Breast Cancer status post Partial  Mastectomy and Radiation.  Sonographer:    Farrel Conners RDCS Referring Phys: Kathlynn Grate [place]  IMPRESSIONS   1. Left ventricular ejection fraction, by estimation, is 60 to 65%. The left ventricle has normal function. The left ventricle has no regional wall motion abnormalities. There is severe asymmetric left ventricular hypertrophy of the basal-septal segment. Left ventricular diastolic parameters are indeterminate. 2. There is systolic anterior motion of the mitral valve but most of MR is related to atrial dilation and bi-leaflet prolapse. 3. Right ventricular systolic function is normal. The right ventricular size is severely enlarged. There is severely elevated pulmonary artery systolic pressure. The estimated right ventricular systolic pressure is 74.9 mmHg. 4. Left atrial size was severely dilated. 5. Right atrial size was severely dilated. 6. The mitral valve is abnormal. Severe mitral valve regurgitation. 7. Tricuspid valve regurgitation is severe. 8. The aortic valve is tricuspid. There is mild calcification of the aortic valve. Aortic valve regurgitation is mild to moderate. No aortic stenosis is present. 9. Aortic dilatation noted. There is severe dilatation of the ascending aorta, measuring 50 mm. There is moderate dilatation of the aortic arch, measuring 45 mm.  Comparison(s): Further aortic dilation, worsening tricuspid and mitral regurgitation.  FINDINGS Left Ventricle: Left ventricular ejection fraction, by estimation, is 60 to 65%. The left ventricle has normal function. The left ventricle has no regional wall motion abnormalities. The left ventricular internal cavity size was normal in size. There is severe asymmetric left ventricular hypertrophy of the basal-septal segment. Left ventricular diastolic function could not be evaluated due to mitral regurgitation (moderate or greater). Left ventricular diastolic parameters are indeterminate.  Right Ventricle: The right  ventricular size is severely enlarged. No increase in right ventricular wall thickness. Right ventricular systolic function is normal. There is severely elevated pulmonary artery systolic pressure. The tricuspid regurgitant velocity is 3.87 m/s, and with an assumed right atrial pressure of 15 mmHg, the estimated right ventricular systolic pressure is 74.9 mmHg.  Left Atrium: Left atrial size was severely dilated.  Right Atrium: Right atrial size was severely dilated.  Pericardium: There is no evidence of pericardial effusion.  Mitral Valve:  The mitral valve is abnormal. Severe mitral valve regurgitation.  Tricuspid Valve: The tricuspid valve is normal in structure. Tricuspid valve regurgitation is severe. No evidence of tricuspid stenosis.  Aortic Valve: The aortic valve is tricuspid. There is mild calcification of the aortic valve. Aortic valve regurgitation is mild to moderate. Aortic regurgitation PHT measures 559 msec. No aortic stenosis is present.  Pulmonic Valve: The pulmonic valve was normal in structure. Pulmonic valve regurgitation is mild. No evidence of pulmonic stenosis.  Aorta: Aortic dilatation noted. There is severe dilatation of the ascending aorta, measuring 50 mm. There is moderate dilatation of the aortic arch, measuring 45 mm.  IAS/Shunts: No atrial level shunt detected by color flow Doppler.   LEFT VENTRICLE PLAX 2D LVIDd:         5.10 cm   Diastology LVIDs:         2.90 cm   LV e' medial:    5.44 cm/s LV PW:         1.00 cm   LV E/e' medial:  14.8 LV IVS:        1.20 cm   LV e' lateral:   7.77 cm/s LVOT diam:     2.00 cm   LV E/e' lateral: 10.3 LV SV:         78 LV SV Index:   57 LVOT Area:     3.14 cm   RIGHT VENTRICLE RV Basal diam:  4.80 cm RV Mid diam:    3.30 cm RV S prime:     29.20 cm/s TAPSE (M-mode): 3.3 cm  LEFT ATRIUM              Index         RIGHT ATRIUM           Index LA diam:        4.50 cm  3.29 cm/m    RA Area:     35.60 cm LA Vol  (A2C):   119.0 ml 87.07 ml/m   RA Volume:   122.00 ml 89.27 ml/m LA Vol (A4C):   144.0 ml 105.36 ml/m LA Biplane Vol: 132.0 ml 96.58 ml/m AORTIC VALVE LVOT Vmax:   147.80 cm/s LVOT Vmean:  91.060 cm/s LVOT VTI:    0.250 m AI PHT:      559 msec  AORTA Ao Root diam: 4.40 cm Ao Asc diam:  4.95 cm  MITRAL VALVE                 TRICUSPID VALVE MV Area (PHT)  cm           TR Peak grad:   59.9 mmHg MV Decel Time: 237 msec      TR Vmax:        387.00 cm/s MR Peak grad:   129.0 mmHg MR Mean grad:   86.0 mmHg    SHUNTS MR Vmax:        568.00 cm/s  Systemic VTI:  0.25 m MR Vmean:       442.5 cm/s   Systemic Diam: 2.00 cm MR PISA:        9.05 cm MR PISA Radius: 1.20 cm MV E velocity: 80.40 cm/s MV A velocity: 117.50 cm/s MV E/A ratio:  0.68  Riley Lam MD Electronically signed by Riley Lam MD Signature Date/Time: 06/05/2022/11:21:15 AM    Final   TEE  ECHO TEE 12/05/2016  Narrative *[place]* *[facility]* [address] [place], [place] [address] [phone]  -------------------------------------------------------------------  Transesophageal Echocardiography  Patient:    Taraneh, Metheney MR #:       [id number] Study Date: 12/04/2016 Gender:     F Age:        27 Height:     149.9 cm Weight:     48.5 kg BSA:        1.43 m^2 Pt. Status: Room:       2W15C  ADMITTING    Maree Krabbe ORDERING     Hillis Range, MD REFERRING    Hillis Range, MD PERFORMING   Zoila Shutter MD ATTENDING    Alison Murray SONOGRAPHER  Lysbeth Galas, RDCS  cc:  ------------------------------------------------------------------- LV EF: 60% -   65%  ------------------------------------------------------------------- Indications:      Mitral regurgitation 424.0.  ------------------------------------------------------------------- History:   PMH:   Dyspnea.  Risk factors:   Hypertension.  ------------------------------------------------------------------- Study Conclusions  - Left ventricle: There was mild concentric hypertrophy. Systolic function was normal. The estimated ejection fraction was in the range of 60% to 65%. Wall motion was normal; there were no regional wall motion abnormalities. - Aortic valve: Sclerosis with mild AI. - Aorta: Dilated. Ascending aortic diameter: 45 mm (S). - Mitral valve: Partially flail P2 segment with moderate, posteriorly directed regurgitation. Effective regurgitant orifice (PISA): 0.33 cm^2. Regurgitant volume (PISA): 40 ml. - Left atrium: Moderately dilated. No evidence of thrombus in the atrial cavity or appendage. - Right atrium: No evidence of thrombus in the atrial cavity or appendage. - Atrial septum: No defect or patent foramen ovale was identified. - Pulmonic valve: No evidence of vegetation.  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 LVEF 60-65%, moderate LAE, no LAA thrombus.  ------------------------------------------------------------------- Study data:   Study status:  Routine.  Consent:  The risks, benefits, and alternatives to the procedure were explained to the patient and informed consent was obtained.  Procedure:  The patient reported no pain pre or post test. Initial setup. The patient was brought to the laboratory. Surface ECG leads were monitored. Sedation. Conscious sedation was administered by cardiology staff. Transesophageal echocardiography. An adult multiplane transesophageal probe was inserted by the attending cardiologistwithout difficulty. Image quality was adequate. Intravenous contrast (agitated saline) was administered.  Study completion:  The patient tolerated the procedure well. There were no complications.  Administered medications:   Midazolam, 4mg , IV. Fentanyl, ,  IV.          Diagnostic transesophageal echocardiography.  2D and color Doppler.  Birthdate:  Patient birthdate: Aug 23, 1936.  Age:  Patient is 87 yr old.  Sex:  Gender: female.    BMI: 21.6 kg/m^2.  Blood pressure:     136/88  Patient status:  Inpatient.  Study date:  Study date: 12/04/2016. Study time: 07:59 AM.  Location:  Endoscopy.  -------------------------------------------------------------------  ------------------------------------------------------------------- Left ventricle:  There was mild concentric hypertrophy. Systolic function was normal. The estimated ejection fraction was in the range of 60% to 65%. Wall motion was normal; there were no regional wall motion abnormalities.  ------------------------------------------------------------------- Aortic valve:  Sclerosis with mild AI.  ------------------------------------------------------------------- Aorta:  Dilated.  ------------------------------------------------------------------- Mitral valve:  Partially flail P2 segment with moderate, posteriorly directed regurgitation.  ------------------------------------------------------------------- Left atrium:  Moderately dilated.  No evidence of thrombus in the atrial cavity or appendage.  ------------------------------------------------------------------- Atrial septum:  No defect or patent foramen ovale was identified.  ------------------------------------------------------------------- Pulmonary veins:  no anomaly.  ------------------------------------------------------------------- Pulmonic valve:  Structurally normal valve.   Cusp separation was normal.  No evidence of vegetation.  ------------------------------------------------------------------- Tricuspid valve:   Doppler:  There was mild regurgitation.  ------------------------------------------------------------------- Pulmonary artery:   The main pulmonary artery was  normal-sized.  ------------------------------------------------------------------- Right atrium:  The atrium was normal in size.  No evidence of thrombus in the atrial cavity or appendage.  ------------------------------------------------------------------- Pericardium:  There was no pericardial effusion.  ------------------------------------------------------------------- Post procedure conclusions Ascending Aorta:  - Dilated.  ------------------------------------------------------------------- Measurements  Aorta                                Value Ascending aorta ID, A-P, S           45    mm  Mitral valve                         Value Mitral regurg VTI, PISA              121   cm Mitral ERO, PISA                     0.33  cm^2 Mitral regurg volume, PISA           40    ml  Tricuspid valve                      Value Tricuspid regurg peak velocity       226   cm/s Tricuspid peak RV-RA gradient        20    mm Hg  Legend: (L)  and  (H)  mark values outside specified reference range.  ------------------------------------------------------------------- Prepared and Electronically Authenticated by  Zoila Shutter MD 2018-01-24T16:46:38             EKG:  EKG is  ordered today.  The ekg ordered today demonstrates SB 47 bpm with first degree AV block PR with stable LAFB. No acute ST/T wave changes.   Recent Labs: 11/29/2022: Hemoglobin 14.3; Platelets 235 01/09/2023: ALT 21; Magnesium 2.1; TSH 2.020 01/29/2023: BNP 183.2 02/15/2023: BUN 32; Creatinine, Ser 0.83; Potassium 4.1; Sodium 137  Recent Lipid Panel    Component Value Date/Time   CHOL  12/02/2010 0400    149        ATP III CLASSIFICATION:  <200     mg/dL   Desirable  161-096  mg/dL   Borderline High  >=045    mg/dL   High          TRIG 54 12/02/2010 0400   HDL 58 12/02/2010 0400   CHOLHDL 2.6 12/02/2010 0400   VLDL 11 12/02/2010 0400   LDLCALC  12/02/2010 0400    80        Total Cholesterol/HDL:CHD  Risk Coronary Heart Disease Risk Table                     Men   Women  1/2 Average Risk   3.4   3.3  Average Risk       5.0   4.4  2 X Average Risk   9.6   7.1  3 X Average Risk  23.4   11.0        Use the calculated Patient Ratio above and the CHD Risk Table to determine the patient's CHD Risk.        ATP III  CLASSIFICATION (LDL):  <100     mg/dL   Optimal  409-811  mg/dL   Near or Above                    Optimal  130-159  mg/dL   Borderline  914-782  mg/dL   High  >956     mg/dL   Very High    Risk Assessment/Calculations:   CHA2DS2-VASc Score = 6   This indicates a 9.7% annual risk of stroke. The patient's score is based upon: CHF History: 1 HTN History: 1 Diabetes History: 0 Stroke History: 0 Vascular Disease History: 1 Age Score: 2 Gender Score: 1     Home Medications   Current Meds  Medication Sig   acetaminophen (TYLENOL) 325 MG tablet Take 2 tablets (650 mg total) by mouth every 6 (six) hours as needed for moderate pain or mild pain.   amiodarone (PACERONE) 200 MG tablet Take 1 tablet (200 mg total) by mouth daily.   apixaban (ELIQUIS) 2.5 MG TABS tablet Take 1 tablet (2.5 mg total) by mouth 2 (two) times daily.   Ascorbic Acid (VITA-C PO) Take 1,000 mg by mouth daily.   bisacodyl (DULCOLAX) 5 MG EC tablet Take 1 tablet (5 mg total) by mouth daily as needed for moderate constipation.   calcium citrate-vitamin D (CITRACAL+D) 315-200 MG-UNIT per tablet Take 1 tablet by mouth 2 (two) times daily.   diclofenac Sodium (VOLTAREN) 1 % GEL Apply 2 g topically 3 (three) times daily as needed (join pains).   diltiazem (CARDIZEM CD) 180 MG 24 hr capsule Take 1 capsule by mouth daily   estradiol (ESTRACE) 0.1 MG/GM vaginal cream Place 1 Applicatorful vaginally once a week.   furosemide (LASIX) 20 MG tablet Take 1 tablet (20 mg total) by mouth daily.   HYDROcodone-acetaminophen (NORCO/VICODIN) 5-325 MG tablet Take 1-2 tablets by mouth every 6 (six) hours as needed (mild  pain).   iron polysaccharides (NIFEREX) 150 MG capsule Take 1 capsule (150 mg total) by mouth 3 (three) times a week. M, W, F   levothyroxine (SYNTHROID) 125 MCG tablet Take 1 tablet (125 mcg total) by mouth daily before breakfast.   Meclizine HCl (BONINE PO) Take 12.5 mg by mouth daily as needed (dizziness).   metoprolol tartrate (LOPRESSOR) 25 MG tablet Take 1 tablet (25 mg total) by mouth daily.   Multiple Vitamin (MULTIVITAMIN) tablet Take 1 tablet by mouth daily with breakfast.   omeprazole (PRILOSEC) 20 MG capsule Take 1 capsule (20 mg total) by mouth daily.   OVER THE COUNTER MEDICATION Take 1,000 mg by mouth daily. D-Mannose 500 mg   potassium chloride (KLOR-CON) 10 MEQ tablet Take 1 tablet (10 mEq total) by mouth 2 (two) times daily. PLEASE TAKE AT THE SAME TIME AS YOU LASIX   pravastatin (PRAVACHOL) 20 MG tablet Take 1 tablet (20 mg total) by mouth daily with supper.   risedronate (ACTONEL) 150 MG tablet Take 150 mg by mouth every 30 (thirty) days. with water on empty stomach, nothing by mouth or lie down for next 30 minutes.   senna-docusate (SENOKOT-S) 8.6-50 MG tablet Take 1 tablet by mouth 2 (two) times daily.   solifenacin (VESICARE) 10 MG tablet Take 1 tablet (10 mg total) by mouth daily.   triamcinolone cream (KENALOG) 0.1 % Apply 1 Application topically 2 (two) times daily as needed.     Review of Systems      All other systems reviewed and are otherwise negative except as  noted above.  Physical Exam    VS:  BP 122/80 (BP Location: Left Arm, Patient Position: Sitting, Cuff Size: Normal)   Pulse (!) 47   Ht 4\' 11"  (1.499 m)   Wt 106 lb 4.8 oz (48.2 kg)   BMI 21.47 kg/m  , BMI Body mass index is 21.47 kg/m.  Wt Readings from Last 3 Encounters:  03/08/23 106 lb 4.8 oz (48.2 kg)  02/15/23 103 lb 9.6 oz (47 kg)  02/06/23 103 lb 1.6 oz (46.8 kg)     GEN: Well nourished, well developed, in no acute distress. HEENT: normal. Neck: Supple, no JVD, carotid bruits, or  masses. Cardiac: bradycardic, no murmurs, rubs, or gallops. No clubbing, cyanosis. RLE 2+, LLE 1+ edema.  Radials/PT 2+ and equal bilaterally.  Respiratory:  Respirations regular and unlabored, clear to auscultation bilaterally. GI: Soft, nontender, nondistended. MS: No deformity or atrophy. Skin: Warm and dry, no rash. Neuro:  Strength and sensation are intact. Psych: Normal affect.  Assessment & Plan    Diastolic heart failure/ LE edema / Severe MR -echo 06/05/2022 normal LVEF, MVP, severe MR/TR, severely elevated PASP.  Not surgical candidate. Worsening LE edema (RLE 2+, LLE 1+) with stable exertional dyspnea. Dry weight at home 102 lbs. Home weight now 106 lbs.  Take Lasix 20mg  QD until weight to 102 lbs. Thereafter return to 20mg  M, W, F with additional tablet PRN for weight >102 lbs. Stop potassium supplement, start Spironolactone 12.5mg  QD. BMP in 1 week. Check in via phone call 1 week. OV in 1 month. Future considerations include Farxiga. Low sodium diet, fluid restriction <2L, and daily weights encouraged. Educated to contact our office for weight gain of 2 lbs overnight or 5 lbs in one week.   PAF/on amiodarone therapy/hypercoagulable state -bradycardic today with reports of occasional lightheadedness.  Reduce diltiazem from 180 to 120 mg daily.  Could consider future reduction or discontinuation of Lopressor as she presently only takes 25 mg daily. Continue Eliquis 2.5 mg twice daily.  Reduced dose due to age, renal function. Amiodarone monitoring: 01/09/2023 TSH 2.02, AST 24, ALT 21.  No evidence of toxicity.  HLD - Continue Pravastatin.  Venous reflux - Appt in July to discuss possible saphenous vein ablation with [facility]. Venous insufficiency contributory to LE edema, continue compression stockings, leg elevation.  Hypokalemia - K tablet previous transitioned to tablet BID for easier swallowing.  Hypothyroidism -01/09/2023 TSH 2.02.  Continue present dose of  levothyroxine.  GERD - Continue Omeprazole.        Disposition: Follow up in 1 month(s) with Annette Sprague, MD or APP.  Signed, Alver Sorrow, NP 03/08/2023, 11:33 AM [facility]

## 2023-03-08 NOTE — Patient Instructions (Addendum)
Medication Instructions:  Your physician has recommended you make the following change in your medication:   STOP Potassium supplement  START Spironolactone 12.5mg  (half tablet) once per day    CHANGE Furosemide (Lasix) Take one tablet daily until your weight returns to 102 lbs When your weight is 102 lbs, take Furosemide on Mondays, Wednesdays, Fridays May take an extra tablet as needed for weight of more than 102 lbs.  CHANGE Diltiazem to 120mg  daily  *If you need a refill on your cardiac medications before your next appointment, please call your pharmacy*   Lab Work: Your physician recommends that you return for lab work in 1 week for BMP.  Please return for Lab work. You may come to the...   [facility] (3rd floor) [address], [place], [place] [address]  Open: 8am-Noon and 1pm-4:30pm  Please ring the doorbell on the small table when you exit the elevator and the Lab Tech will come get you  [facility] at [facility] [address], [place], [place] [address] Open: 8am-1pm, then 2pm-4:30pm   [facility]- Please see attached locations sheet stapled to your lab work with address and hours.     If you have labs (blood work) drawn today and your tests are completely normal, you will receive your results only by: MyChart Message (if you have MyChart) OR A paper copy in the mail If you have any lab test that is abnormal or we need to change your treatment, we will call you to review the results.   Testing/Procedures: Your EKG today showed sinus bradycardia which is a regular heart rhythm that is just a bit slow. We have reduced your Diltiazem today to prevent your heart rate from dropping as low.  Follow-Up: At [facility], you and your health needs are our priority.  As part of our continuing mission to provide you with exceptional heart care, we have created designated Provider Care Teams.  These Care Teams include  your primary Cardiologist (physician) and Advanced Practice Providers (APPs -  Physician Assistants and Nurse Practitioners) who all work together to provide you with the care you need, when you need it.  We recommend signing up for the patient portal called "MyChart".  Sign up information is provided on this After Visit Summary.  MyChart is used to connect with patients for Virtual Visits (Telemedicine).  Patients are able to view lab/test results, encounter notes, upcoming appointments, etc.  Non-urgent messages can be sent to your provider as well.   To learn more about what you can do with MyChart, go to [email or web address].    Your next appointment:   04/09/23 at 10:05 AM with Alver Sorrow, NP at [facility] at [facility]  Other Instructions   Recommend weighing daily and keeping a log. Please call our office if you have weight gain of 2 pounds overnight or 5 pounds in 1 week.   Date  Time Weight

## 2023-03-12 ENCOUNTER — Ambulatory Visit (HOSPITAL_BASED_OUTPATIENT_CLINIC_OR_DEPARTMENT_OTHER): Payer: Medicare Other | Admitting: Family

## 2023-03-18 DIAGNOSIS — I5032 Chronic diastolic (congestive) heart failure: Secondary | ICD-10-CM | POA: Diagnosis not present

## 2023-03-19 ENCOUNTER — Telehealth (HOSPITAL_BASED_OUTPATIENT_CLINIC_OR_DEPARTMENT_OTHER): Payer: Self-pay

## 2023-03-19 LAB — BASIC METABOLIC PANEL
BUN/Creatinine Ratio: 38 — ABNORMAL HIGH (ref 12–28)
BUN: 36 mg/dL — ABNORMAL HIGH (ref 8–27)
CO2: 22 mmol/L (ref 20–29)
Calcium: 9.1 mg/dL (ref 8.7–10.3)
Chloride: 100 mmol/L (ref 96–106)
Creatinine, Ser: 0.95 mg/dL (ref 0.57–1.00)
Glucose: 75 mg/dL (ref 70–99)
Potassium: 4.5 mmol/L (ref 3.5–5.2)
Sodium: 139 mmol/L (ref 134–144)
eGFR: 59 mL/min/{1.73_m2} — ABNORMAL LOW (ref >59)

## 2023-03-19 NOTE — Telephone Encounter (Addendum)
Called results to patient and left results on VM (ok per DPR), instructions left to call office back if patient has any questions!   ----- Message from Alver Sorrow, NP sent at 03/19/2023  9:06 AM EDT ----- Stable kidney function. Normal electrolytes. Good result!

## 2023-04-08 NOTE — Progress Notes (Unsigned)
Office Visit    Patient Name: [name] Date of Encounter: 04/09/2023  PCP:  Georgann Housekeeper, MD   [facility]  Cardiologist:  Meriam Sprague, MD  Advanced Practice Provider:  No care team member to display Electrophysiologist:  None      Chief Complaint    [name] is a 87 y.o. female presents today for follow-up after addition of spironolactone  Past Medical History    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/20/2011   R breast DCIS, ER/PR +   Cataract 3 and 10/92   bilateral   Chronic diastolic CHF (congestive heart failure) (HCC)    Compression fracture of fourth lumbar vertebra (HCC)    DVT (deep venous thrombosis) (HCC) 08/2011   LL extremity    Fibromyalgia    Fracture lumbar vertebra-closed (HCC)    Fracture of thoracic vertebra, closed (HCC)    GERD (gastroesophageal reflux disease)    H/O hiatal hernia    History of blood clots    History of radiation therapy 01/2012   R breast   Hypercholesteremia    Hypertension    DR Terrilee Files   Hypothyroidism    Mitral regurgitation    Mitral valve prolapse    Osteoporosis    PAF (paroxysmal atrial fibrillation) (HCC)    a. dx 11/2016.   Pulmonary hypertension (HCC)    Rib fractures    Thoracic ascending aortic aneurysm (HCC)    a. followed by Dr. Donata Clay.   Tricuspid regurgitation    Past Surgical History:  Procedure Laterality Date   ANTERIOR CERVICAL DECOMP/DISCECTOMY FUSION N/A 09/09/2015   Procedure: Anterior Cervical Decompression and Fusion Cervical seven-Thorasic one ;  Surgeon: Maeola Harman, MD;  Location: [facility] NEURO ORS;  Service: Neurosurgery;  Laterality: N/A;   APPENDECTOMY  1940   BREAST SURGERY     CATARACT EXTRACTION  1992   EYE SURGERY  1940, 1956   HERNIA REPAIR  10/16/2006   [facility] - Dr Daphine Deutscher   KYPHOPLASTY N/A 01/26/2013   Procedure: KYPHOPLASTY;  Surgeon: Barnett Abu, MD;  Location: [facility]  NEURO ORS;  Service: Neurosurgery;  Laterality: N/A;  T11 and L1   KYPHOPLASTY N/A 06/21/2017   Procedure: Lumbar four Kyphoplasty;  Surgeon: Maeola Harman, MD;  Location: [facility];  Service: Neurosurgery;  Laterality: N/A;   MASTECTOMY, PARTIAL  10/17/2011   Procedure: MASTECTOMY PARTIAL;  Surgeon: Currie Paris, MD;  Location: [facility] OR;  Service: General;  Laterality: Right;  needle guided   TEE WITHOUT CARDIOVERSION N/A 12/04/2016   Procedure: TRANSESOPHAGEAL ECHOCARDIOGRAM (TEE);  Surgeon: Chrystie Nose, MD;  Location: [facility] ENDOSCOPY;  Service: Cardiovascular;  Laterality: N/A;   TONSILLECTOMY  1944    Allergies  Allergies  Allergen Reactions   Contrast Media [Iodinated Contrast Media] Shortness Of Breath   Iohexol Shortness Of Breath   Mucinex [Guaifenesin Er] Other (See Comments)    DIZZINESS   Zanaflex [Tizanidine Hcl] Anxiety and Other (See Comments)    Dizziness   Penicillins Rash    Has patient had a PCN reaction causing immediate rash, facial/tongue/throat swelling, SOB or lightheadedness with hypotension: No Has patient had a PCN reaction causing severe rash involving mucus membranes or skin necrosis: No Has patient had a PCN reaction that required hospitalization No Has patient had a PCN reaction occurring within the last 10 years: No If all of  the above answers are "NO", then may proceed with Cephalosporin use.    Robaxin [Methocarbamol] Diarrhea    History of Present Illness    [name] is a 87 y.o. female with a hx of chronic diastolic heart failure, severe MR (not surgical candidate), atrial fibrillation, thoracic aortic aneurysm, hiatal hernia last seen 03/08/23.  Previously followed with Dr. Delton See has since established with Dr. Shari Prows.  TTE 11/2016 LVEF 55 to 60%, mild to moderate AI, moderate MR, severe atrial enlargement, severe TR.  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  Known  paroxysmal atrial fibrillation s/p spontaneous conversion to NSR on diltiazem and flecainide.  Myoview 01/2017 poor quality but low risk with EF 72%.  Echo 07/2018 EF 55 to 60%, grade 1 diastolic dysfunction, mildly dilated ascending aorta with moderate MVP with MR and moderate to severe dilated LA, moderate TR, moderate pulmonary hypertension.  Evaluated Dr. Excell Seltzer 06/05/2021 deemed best for medical management given relative lack of symptoms and comorbidities. 05/2022 LVEF 60 to 65%, severe RV enlargement with preserved RV function, severe MR with mild to moderate AR and severe aortic dilation 50 mm, severe TR.  Not surgical candidate due to severe scoliosis.  Admitted 11/2022 for acute on chronic diastolic heart failure exacerbation and atrial fibrillation with RVR.  Losartan discontinued during that admission due to soft blood pressure.  She was diuresed and placed on p.o. amiodarone in addition to apixaban, metoprolol, diltiazem.  Previously on torsemide but felt Lasix worked better and was transition back to Lasix 01/23/2023.  Previously referred to [facility] and found to be candidate for saphenous vein ablation with follow-up scheduled 05/13/2023 to discuss with MD.  At visit 03/07/2023 noted good diuresis with Lasix.  Potassium adjusted to 10 mill equivalent twice per day instead of larger potassium 20 mill equivalent tablet.  Small amount of weeping noted in LLE for RLE improved.  She requested to transition from Protonix to omeprazole.  At visit 03/08/23 she was worked in for visit for edema. She was recommended to take Lasix 20mg  QD until weight 102 lbs then return to M, W, F. Potassium discontinued and she was started on Spironolacteon 12.5mg  QD. Diltiazem reduced due to bradycardia.  Presents today for follow up. Weight down 6 lbs from clinic visit 1 month ago.  Weight at home 99-100 pounds.  Lower extremity edema much improved since introduction of spironolactone.  She has been taking Lasix daily instead of 3  times per week.  Does note fatigue over the past several weeks such as falling asleep at lunchtime over while sitting at her computer sending emails.  She also notes some instability or balance issue which she previously worked with PT for but feels there is room for improvement.  She is not sure if this was related to her medications or other etiology.  EKGs/Labs/Other Studies Reviewed:   The following studies were reviewed today: Cardiac Studies & Procedures     STRESS TESTS  MYOCARDIAL PERFUSION IMAGING 01/29/2017  Narrative  The nuclear study is very poor quality due to body habitus  Defect 1: There is a defect present in the basal inferolateral and mid inferolateral location that is most likely artifact due to uptake in structures below the diaphragm.  This is a low risk study.  Nuclear stress EF: 72%. The left ventricular ejection fraction is hyperdynamic (>65%).   ECHOCARDIOGRAM  ECHOCARDIOGRAM COMPLETE 06/05/2022  Narrative ECHOCARDIOGRAM REPORT  Patient Name:   [name] Date of Exam: 06/05/2022 Medical Rec #:  [id number]          Height:       59.0 in Accession #:    [id number]         Weight:       98.8 lb Date of Birth:  09/27/1936          BSA:          1.367 m Patient Age:    86 years           BP:           130/90 mmHg Patient Gender: F                  HR:           74 bpm. Exam Location:  [address]  Procedure: 2D Echo, Cardiac Doppler and Color Doppler  Indications:    I34.0 Mitral Valve Insufficiency  History:        Patient has prior history of Echocardiogram examinations, most recent 11/17/2021. CHF, Mitral Valve Prolapse, Mitral Valve Disease, Aortic Valve Disease and Tricuspid Insufficiency, Arrythmias:Atrial Fibrillation; Risk Factors:Hypertension, Dyslipidemia and Family History of Coronary Artery Disease. Ascending Aortic Aneurysm, Right Breast Cancer status post Partial Mastectomy and Radiation.  Sonographer:    Farrel Conners  RDCS Referring Phys: Kathlynn Grate [place]  IMPRESSIONS   1. Left ventricular ejection fraction, by estimation, is 60 to 65%. The left ventricle has normal function. The left ventricle has no regional wall motion abnormalities. There is severe asymmetric left ventricular hypertrophy of the basal-septal segment. Left ventricular diastolic parameters are indeterminate. 2. There is systolic anterior motion of the mitral valve but most of MR is related to atrial dilation and bi-leaflet prolapse. 3. Right ventricular systolic function is normal. The right ventricular size is severely enlarged. There is severely elevated pulmonary artery systolic pressure. The estimated right ventricular systolic pressure is 74.9 mmHg. 4. Left atrial size was severely dilated. 5. Right atrial size was severely dilated. 6. The mitral valve is abnormal. Severe mitral valve regurgitation. 7. Tricuspid valve regurgitation is severe. 8. The aortic valve is tricuspid. There is mild calcification of the aortic valve. Aortic valve regurgitation is mild to moderate. No aortic stenosis is present. 9. Aortic dilatation noted. There is severe dilatation of the ascending aorta, measuring 50 mm. There is moderate dilatation of the aortic arch, measuring 45 mm.  Comparison(s): Further aortic dilation, worsening tricuspid and mitral regurgitation.  FINDINGS Left Ventricle: Left ventricular ejection fraction, by estimation, is 60 to 65%. The left ventricle has normal function. The left ventricle has no regional wall motion abnormalities. The left ventricular internal cavity size was normal in size. There is severe asymmetric left ventricular hypertrophy of the basal-septal segment. Left ventricular diastolic function could not be evaluated due to mitral regurgitation (moderate or greater). Left ventricular diastolic parameters are indeterminate.  Right Ventricle: The right ventricular size is severely enlarged. No increase in right  ventricular wall thickness. Right ventricular systolic function is normal. There is severely elevated pulmonary artery systolic pressure. The tricuspid regurgitant velocity is 3.87 m/s, and with an assumed right atrial pressure of 15 mmHg, the estimated right ventricular systolic pressure is 74.9 mmHg.  Left Atrium: Left atrial size was severely dilated.  Right Atrium: Right atrial size was severely dilated.  Pericardium: There is no evidence of pericardial effusion.  Mitral Valve: The mitral valve is abnormal. Severe mitral valve regurgitation.  Tricuspid Valve: The tricuspid valve is normal in structure. Tricuspid valve regurgitation is severe. No evidence of tricuspid stenosis.  Aortic Valve: The aortic valve is tricuspid. There is mild calcification of the aortic valve. Aortic valve regurgitation is mild to moderate. Aortic regurgitation PHT measures 559 msec. No aortic stenosis is present.  Pulmonic Valve: The pulmonic valve was normal in structure. Pulmonic valve regurgitation is mild. No evidence of pulmonic stenosis.  Aorta: Aortic dilatation noted. There is severe dilatation of the ascending aorta, measuring 50 mm. There is moderate dilatation of the aortic arch, measuring 45 mm.  IAS/Shunts: No atrial level shunt detected by color flow Doppler.   LEFT VENTRICLE PLAX 2D LVIDd:         5.10 cm   Diastology LVIDs:         2.90 cm   LV e' medial:    5.44 cm/s LV PW:         1.00 cm   LV E/e' medial:  14.8 LV IVS:        1.20 cm   LV e' lateral:   7.77 cm/s LVOT diam:     2.00 cm   LV E/e' lateral: 10.3 LV SV:         78 LV SV Index:   57 LVOT Area:     3.14 cm   RIGHT VENTRICLE RV Basal diam:  4.80 cm RV Mid diam:    3.30 cm RV S prime:     29.20 cm/s TAPSE (M-mode): 3.3 cm  LEFT ATRIUM              Index         RIGHT ATRIUM           Index LA diam:        4.50 cm  3.29 cm/m    RA Area:     35.60 cm LA Vol (A2C):   119.0 ml 87.07 ml/m   RA Volume:   122.00 ml  89.27 ml/m LA Vol (A4C):   144.0 ml 105.36 ml/m LA Biplane Vol: 132.0 ml 96.58 ml/m AORTIC VALVE LVOT Vmax:   147.80 cm/s LVOT Vmean:  91.060 cm/s LVOT VTI:    0.250 m AI PHT:      559 msec  AORTA Ao Root diam: 4.40 cm Ao Asc diam:  4.95 cm  MITRAL VALVE                 TRICUSPID VALVE MV Area (PHT)  cm           TR Peak grad:   59.9 mmHg MV Decel Time: 237 msec      TR Vmax:        387.00 cm/s MR Peak grad:   129.0 mmHg MR Mean grad:   86.0 mmHg    SHUNTS MR Vmax:        568.00 cm/s  Systemic VTI:  0.25 m MR Vmean:       442.5 cm/s   Systemic Diam: 2.00 cm MR PISA:        9.05 cm MR PISA Radius: 1.20 cm MV E velocity: 80.40 cm/s MV A velocity: 117.50 cm/s MV E/A ratio:  0.68  Riley Lam MD Electronically signed by Riley Lam MD Signature Date/Time: 06/05/2022/11:21:15 AM    Final   TEE  ECHO TEE 12/05/2016  Narrative *[facility]* *[facility]* [address] [place], [place] [address] [phone]  ------------------------------------------------------------------- Transesophageal Echocardiography  Patient:    Velta, Mcmorris  C MR #:       [id number] Study Date: 12/04/2016 Gender:     F Age:        68 Height:     149.9 cm Weight:     48.5 kg BSA:        1.43 m^2 Pt. Status: Room:       2W15C  ADMITTING    Maree Krabbe ORDERING     Hillis Range, MD REFERRING    Hillis Range, MD PERFORMING   Zoila Shutter MD ATTENDING    Alison Murray SONOGRAPHER  Lysbeth Galas, RDCS  cc:  ------------------------------------------------------------------- LV EF: 60% -   65%  ------------------------------------------------------------------- Indications:      Mitral regurgitation 424.0.  ------------------------------------------------------------------- History:   PMH:   Dyspnea.  Risk factors:  Hypertension.  ------------------------------------------------------------------- Study Conclusions  - Left  ventricle: There was mild concentric hypertrophy. Systolic function was normal. The estimated ejection fraction was in the range of 60% to 65%. Wall motion was normal; there were no regional wall motion abnormalities. - Aortic valve: Sclerosis with mild AI. - Aorta: Dilated. Ascending aortic diameter: 45 mm (S). - Mitral valve: Partially flail P2 segment with moderate, posteriorly directed regurgitation. Effective regurgitant orifice (PISA): 0.33 cm^2. Regurgitant volume (PISA): 40 ml. - Left atrium: Moderately dilated. No evidence of thrombus in the atrial cavity or appendage. - Right atrium: No evidence of thrombus in the atrial cavity or appendage. - Atrial septum: No defect or patent foramen ovale was identified. - Pulmonic valve: No evidence of vegetation.  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 LVEF 60-65%, moderate LAE, no LAA thrombus.  ------------------------------------------------------------------- Study data:   Study status:  Routine.  Consent:  The risks, benefits, and alternatives to the procedure were explained to the patient and informed consent was obtained.  Procedure:  The patient reported no pain pre or post test. Initial setup. The patient was brought to the laboratory. Surface ECG leads were monitored. Sedation. Conscious sedation was administered by cardiology staff. Transesophageal echocardiography. An adult multiplane transesophageal probe was inserted by the attending cardiologistwithout difficulty. Image quality was adequate. Intravenous contrast (agitated saline) was administered.  Study completion:  The patient tolerated the procedure well. There were no complications.  Administered medications:   Midazolam, 4mg , IV. Fentanyl, , IV.          Diagnostic transesophageal echocardiography.  2D and color Doppler.  Birthdate:  Patient birthdate:  09-02-1936.  Age:  Patient is 87 yr old.  Sex:  Gender: female.    BMI: 21.6 kg/m^2.  Blood pressure:     136/88  Patient status:  Inpatient.  Study date:  Study date: 12/04/2016. Study time: 07:59 AM.  Location:  Endoscopy.  -------------------------------------------------------------------  ------------------------------------------------------------------- Left ventricle:  There was mild concentric hypertrophy. Systolic function was normal. The estimated ejection fraction was in the range of 60% to 65%. Wall motion was normal; there were no regional wall motion abnormalities.  ------------------------------------------------------------------- Aortic valve:  Sclerosis with mild AI.  ------------------------------------------------------------------- Aorta:  Dilated.  ------------------------------------------------------------------- Mitral valve:  Partially flail P2 segment with moderate, posteriorly directed regurgitation.  ------------------------------------------------------------------- Left atrium:  Moderately dilated.  No evidence of thrombus in the atrial cavity or appendage.  ------------------------------------------------------------------- Atrial septum:  No defect or patent foramen ovale was identified.  ------------------------------------------------------------------- Pulmonary veins:  no anomaly.  ------------------------------------------------------------------- Pulmonic valve:    Structurally normal valve.   Cusp separation  was normal.  No evidence of vegetation.  ------------------------------------------------------------------- Tricuspid valve:   Doppler:  There was mild regurgitation.  ------------------------------------------------------------------- Pulmonary artery:   The main pulmonary artery was normal-sized.  ------------------------------------------------------------------- Right atrium:  The atrium was normal in size.  No evidence  of thrombus in the atrial cavity or appendage.  ------------------------------------------------------------------- Pericardium:  There was no pericardial effusion.  ------------------------------------------------------------------- Post procedure conclusions Ascending Aorta:  - Dilated.  ------------------------------------------------------------------- Measurements  Aorta                                Value Ascending aorta ID, A-P, S           45    mm  Mitral valve                         Value Mitral regurg VTI, PISA              121   cm Mitral ERO, PISA                     0.33  cm^2 Mitral regurg volume, PISA           40    ml  Tricuspid valve                      Value Tricuspid regurg peak velocity       226   cm/s Tricuspid peak RV-RA gradient        20    mm Hg  Legend: (L)  and  (H)  mark values outside specified reference range.  ------------------------------------------------------------------- Prepared and Electronically Authenticated by  Zoila Shutter MD 2018-01-24T16:46:38             EKG:  EKG is not  ordered today.    Recent Labs: 11/29/2022: Hemoglobin 14.3; Platelets 235 01/09/2023: ALT 21; Magnesium 2.1; TSH 2.020 01/29/2023: BNP 183.2 03/18/2023: BUN 36; Creatinine, Ser 0.95; Potassium 4.5; Sodium 139  Recent Lipid Panel    Component Value Date/Time   CHOL  12/02/2010 0400    149        ATP III CLASSIFICATION:  <200     mg/dL   Desirable  161-096  mg/dL   Borderline High  >=045    mg/dL   High          TRIG 54 12/02/2010 0400   HDL 58 12/02/2010 0400   CHOLHDL 2.6 12/02/2010 0400   VLDL 11 12/02/2010 0400   LDLCALC  12/02/2010 0400    80        Total Cholesterol/HDL:CHD Risk Coronary Heart Disease Risk Table                     Men   Women  1/2 Average Risk   3.4   3.3  Average Risk       5.0   4.4  2 X Average Risk   9.6   7.1  3 X Average Risk  23.4   11.0        Use the calculated Patient Ratio above and the CHD Risk  Table to determine the patient's CHD Risk.        ATP III CLASSIFICATION (LDL):  <100     mg/dL   Optimal  409-811  mg/dL   Near or Above  Optimal  130-159  mg/dL   Borderline  409-811  mg/dL   High  >914     mg/dL   Very High    Risk Assessment/Calculations:   CHA2DS2-VASc Score = 6   This indicates a 9.7% annual risk of stroke. The patient's score is based upon: CHF History: 1 HTN History: 1 Diabetes History: 0 Stroke History: 0 Vascular Disease History: 1 Age Score: 2 Gender Score: 1     Home Medications   Current Meds  Medication Sig   amiodarone (PACERONE) 200 MG tablet Take 1 tablet (200 mg total) by mouth daily.   apixaban (ELIQUIS) 2.5 MG TABS tablet Take 1 tablet (2.5 mg total) by mouth 2 (two) times daily.   Ascorbic Acid (VITA-C PO) Take 1,000 mg by mouth daily.   calcium citrate-vitamin D (CITRACAL+D) 315-200 MG-UNIT per tablet Take 1 tablet by mouth 2 (two) times daily.   diltiazem (CARDIZEM CD) 120 MG 24 hr capsule Take 1 capsule (120 mg total) by mouth daily.   estradiol (ESTRACE) 0.1 MG/GM vaginal cream Place 1 Applicatorful vaginally once a week.   furosemide (LASIX) 20 MG tablet Take one tablet daily until weight returns to 102 lbs. Thereafter, take Furosemide on Mondays, Wednesdays, Fridays. May take an extra tablet as needed for weight of more than 102 lbs.   HYDROcodone-acetaminophen (NORCO/VICODIN) 5-325 MG tablet Take 1-2 tablets by mouth every 6 (six) hours as needed (mild pain).   iron polysaccharides (NIFEREX) 150 MG capsule Take 1 capsule (150 mg total) by mouth 3 (three) times a week. M, W, F   levothyroxine (SYNTHROID) 125 MCG tablet Take 1 tablet (125 mcg total) by mouth daily before breakfast.   Meclizine HCl (BONINE PO) Take by mouth as needed.   metoprolol tartrate (LOPRESSOR) 25 MG tablet Take 1 tablet (25 mg total) by mouth daily.   Multiple Vitamin (MULTIVITAMIN) tablet Take 1 tablet by mouth daily with breakfast.    omeprazole (PRILOSEC) 20 MG capsule Take 1 capsule (20 mg total) by mouth daily.   OVER THE COUNTER MEDICATION Take 1,000 mg by mouth daily. D-Mannose 500 mg   pravastatin (PRAVACHOL) 20 MG tablet Take 1 tablet (20 mg total) by mouth daily with supper.   risedronate (ACTONEL) 150 MG tablet Take 150 mg by mouth every 30 (thirty) days. with water on empty stomach, nothing by mouth or lie down for next 30 minutes.   senna-docusate (SENOKOT-S) 8.6-50 MG tablet Take 1 tablet by mouth 2 (two) times daily.   solifenacin (VESICARE) 10 MG tablet Take 1 tablet (10 mg total) by mouth daily.   spironolactone (ALDACTONE) 25 MG tablet Take 0.5 tablets (12.5 mg total) by mouth daily.     Review of Systems      All other systems reviewed and are otherwise negative except as noted above.  Physical Exam    VS:  BP 120/74   Pulse (!) 50   Ht 4\' 11"  (1.499 m)   Wt 100 lb (45.4 kg)   BMI 20.20 kg/m  , BMI Body mass index is 20.2 kg/m.  Wt Readings from Last 3 Encounters:  04/09/23 100 lb (45.4 kg)  03/08/23 106 lb 4.8 oz (48.2 kg)  02/15/23 103 lb 9.6 oz (47 kg)     GEN: Well nourished, well developed, in no acute distress. HEENT: normal. Neck: Supple, no JVD, carotid bruits, or masses. Cardiac: bradycardic, 3/6 systolic murmur, no rubs, or gallops. No clubbing, cyanosis. RLE 2+, LLE 1+ edema.  Radials/PT 2+ and  equal bilaterally.  Respiratory:  Respirations regular and unlabored, clear to auscultation bilaterally. GI: Soft, nontender, nondistended. MS: No deformity or atrophy. Skin: Warm and dry, no rash. Neuro:  Strength and sensation are intact. Psych: Normal affect.  Assessment & Plan    Diastolic heart failure/ LE edema / Severe MR -echo 06/05/2022 normal LVEF, MVP, severe MR/TR, severely elevated PASP.  Not surgical candidate.  Dry weight at home 102 lbs. Home weight now 99 lbs. Weight today in clinic 100lbs which is down 6 lbs from last clinic visit  Reduce Lasix 20mg  to 20mg  M, W, F with  additional tablet PRN for weight >102 lbs. Continue Spironolactone 12.5mg  QD.  Update BMP today. Future considerations include Farxiga. Low sodium diet, fluid restriction <2L, and daily weights encouraged. Educated to contact our office for weight gain of 2 lbs overnight or 5 lbs in one week.   Fatigue - Due to persistent fatigue over the past few weeks update thyroid panel. Update CBC to rule out anemia. Denies  bleeding. Hopeful discontinuation of Metoprolol will also help symptoms. If persistent, plan to refer to [facility].  PAF/on amiodarone therapy/hypercoagulable state -bradycardic today with reports of occasional lightheadedness.  Diltiazem previously reduced. Stop metoprolol tartrate (presently taking QD).  Continue amiodarone 10 mg daily.  If persistent bradycardia could trial reduced dose of 100 mg in the future. Continue Eliquis 2.5 mg twice daily.  Reduced dose due to age, renal function. Amiodarone monitoring: 01/09/2023 TSH 2.02, AST 24, ALT 21.  12/2022 AST 24 ALT 21.  No evidence of toxicity.  Aortic atherosclerosis / HLD - Continue Pravastatin. Stable with no anginal symptoms. No indication for ischemic evaluation.    Venous reflux - Appt in July to discuss possible saphenous vein ablation with [facility]. Venous insufficiency contributory to LE edema, continue compression stockings, leg elevation.  Hypothyroidism -01/09/2023 TSH 2.02.  Continue present dose of levothyroxine.Update thyroid panel due to fatigue.   GERD - Continue Omeprazole.Continue to follow with PCP.         Disposition: Follow up  2-3 months  with Meriam Sprague, MD or APP.  Signed, Alver Sorrow, NP 04/09/2023, 10:11 AM [facility]

## 2023-04-09 ENCOUNTER — Encounter (HOSPITAL_BASED_OUTPATIENT_CLINIC_OR_DEPARTMENT_OTHER): Payer: Self-pay | Admitting: Family

## 2023-04-09 ENCOUNTER — Ambulatory Visit (INDEPENDENT_AMBULATORY_CARE_PROVIDER_SITE_OTHER): Payer: Medicare Other | Admitting: Family

## 2023-04-09 VITALS — BP 120/74 | HR 50 | Ht 59.0 in | Wt 100.0 lb

## 2023-04-09 DIAGNOSIS — I340 Nonrheumatic mitral (valve) insufficiency: Secondary | ICD-10-CM | POA: Diagnosis not present

## 2023-04-09 DIAGNOSIS — I480 Paroxysmal atrial fibrillation: Secondary | ICD-10-CM | POA: Diagnosis not present

## 2023-04-09 DIAGNOSIS — D6859 Other primary thrombophilia: Secondary | ICD-10-CM

## 2023-04-09 DIAGNOSIS — R600 Localized edema: Secondary | ICD-10-CM

## 2023-04-09 DIAGNOSIS — I5032 Chronic diastolic (congestive) heart failure: Secondary | ICD-10-CM

## 2023-04-09 MED ORDER — FUROSEMIDE 20 MG PO TABS: ORAL_TABLET | ORAL | 3 refills | Status: DC

## 2023-04-09 NOTE — Patient Instructions (Addendum)
Medication Instructions:  Your physician has recommended you make the following change in your medication:  STOP Metoprolol tartrate  CHANGE Furosemide (Lasix) to 20mg  three times per week (M,W, &F) may take an extra as needed for weight gain of 2 pounds in one night, 5 pounds in one week or swelling   CONTINUE Spironolactone half tablet daily  *If you need a refill on your cardiac medications before your next appointment, please call your pharmacy*   Lab Work: Your physician recommends that you return for lab work today: BMP, CBC, thyroid panel  If you have labs (blood work) drawn today and your tests are completely normal, you will receive your results only by: MyChart Message (if you have MyChart) OR A paper copy in the mail If you have any lab test that is abnormal or we need to change your treatment, we will call you to review the results.  Follow-Up: At [facility], you and your health needs are our priority.  As part of our continuing mission to provide you with exceptional heart care, we have created designated Provider Care Teams.  These Care Teams include your primary Cardiologist (physician) and Advanced Practice Providers (APPs -  Physician Assistants and Nurse Practitioners) who all work together to provide you with the care you need, when you need it.  We recommend signing up for the patient portal called "MyChart".  Sign up information is provided on this After Visit Summary.  MyChart is used to connect with patients for Virtual Visits (Telemedicine).  Patients are able to view lab/test results, encounter notes, upcoming appointments, etc.  Non-urgent messages can be sent to your provider as well.   To learn more about what you can do with MyChart, go to [email or web address].    Your next appointment:   2-3 months with Gillian Shields, NP   Other Instructions  Recommend weighing daily and keeping a log. Please call our office if you have weight gain of 2  pounds overnight or 5 pounds in 1 week.   Recommend drinking less than 2 L (64 oz) of fluid per day.

## 2023-04-10 ENCOUNTER — Telehealth: Payer: Self-pay | Admitting: Cardiology

## 2023-04-10 ENCOUNTER — Telehealth (HOSPITAL_BASED_OUTPATIENT_CLINIC_OR_DEPARTMENT_OTHER): Payer: Self-pay

## 2023-04-10 LAB — CBC
Hematocrit: 40.3 % (ref 34.0–46.6)
Hemoglobin: 13.3 g/dL (ref 11.1–15.9)
MCH: 30.3 pg (ref 26.6–33.0)
MCHC: 33 g/dL (ref 31.5–35.7)
MCV: 92 fL (ref 79–97)
Platelets: 184 10*3/uL (ref 150–450)
RBC: 4.39 x10E6/uL (ref 3.77–5.28)
RDW: 13.5 % (ref 11.7–15.4)
WBC: 9.2 10*3/uL (ref 3.4–10.8)

## 2023-04-10 LAB — BASIC METABOLIC PANEL
BUN/Creatinine Ratio: 37 — ABNORMAL HIGH (ref 12–28)
BUN: 37 mg/dL — ABNORMAL HIGH (ref 8–27)
CO2: 24 mmol/L (ref 20–29)
Calcium: 9.6 mg/dL (ref 8.7–10.3)
Chloride: 97 mmol/L (ref 96–106)
Creatinine, Ser: 0.99 mg/dL (ref 0.57–1.00)
Glucose: 98 mg/dL (ref 70–99)
Potassium: 3.6 mmol/L (ref 3.5–5.2)
Sodium: 135 mmol/L (ref 134–144)
eGFR: 56 mL/min/{1.73_m2} — ABNORMAL LOW (ref >59)

## 2023-04-10 LAB — THYROID PANEL WITH TSH
Free Thyroxine Index: 5.1 — ABNORMAL HIGH (ref 1.2–4.9)
T3 Uptake Ratio: 37 % (ref 24–39)
T4, Total: 13.8 ug/dL — ABNORMAL HIGH (ref 4.5–12.0)
TSH: 1.21 u[IU]/mL (ref 0.450–4.500)

## 2023-04-10 NOTE — Telephone Encounter (Signed)
Pt c/o medication issue:  1. Name of Medication: apixaban (ELIQUIS) 2.5 MG TABS tablet   2. How are you currently taking this medication (dosage and times per day)? Take 1 tablet (2.5 mg total) by mouth 2 (two) times daily.   3. Are you having a reaction (difficulty breathing--STAT)? No  4. What is your medication issue? Pt stated her coupon has expired so she needs another one called in. Please advise

## 2023-04-10 NOTE — Telephone Encounter (Addendum)
Results called to patient who verbalizes understanding!   ----- Message from Alver Sorrow, NP sent at 04/10/2023  7:58 AM EDT ----- Stable kidney function and normal electrolytes.  Stable thyroid function.  CBC no anemia nor infection.  Good result.

## 2023-04-11 NOTE — Telephone Encounter (Signed)
Called patient, no answer, left detailed message on patient's VM per DPR explaining that patient can go online to download new copay card or can come to our office to pick one up.

## 2023-04-11 NOTE — Telephone Encounter (Signed)
Patient has state health plan. Can go online and download a new copay card or alternatively come to office and pick one of from our supply.

## 2023-04-11 NOTE — Telephone Encounter (Signed)
Spoke with pt and advised Copay card has been mailed to her address and she will need to activate the card once she receives it. Pt grateful for the assistance.

## 2023-04-11 NOTE — Telephone Encounter (Signed)
   Pt returning call, she said, she is not currently driving and her printer ran out of ink. She would like to ask if copay card can be mail to her.

## 2023-04-23 ENCOUNTER — Ambulatory Visit (INDEPENDENT_AMBULATORY_CARE_PROVIDER_SITE_OTHER): Payer: Medicare Other | Admitting: Podiatry

## 2023-04-23 ENCOUNTER — Encounter: Payer: Self-pay | Admitting: Podiatry

## 2023-04-23 DIAGNOSIS — M79675 Pain in left toe(s): Secondary | ICD-10-CM

## 2023-04-23 DIAGNOSIS — D689 Coagulation defect, unspecified: Secondary | ICD-10-CM | POA: Diagnosis not present

## 2023-04-23 DIAGNOSIS — B351 Tinea unguium: Secondary | ICD-10-CM

## 2023-04-23 DIAGNOSIS — M79674 Pain in right toe(s): Secondary | ICD-10-CM

## 2023-04-23 NOTE — Progress Notes (Signed)
  Subjective:  Patient ID: [name], female    DOB: June 26, 1936,  MRN: [id number]  Chief Complaint  Patient presents with   Nail Problem     Routine foot care    87 y.o. female presents for concern of thickened elongated and painful nails that are difficult to trim. Requesting to have them trimmed today. Patient has history of blood thinner use.   PCP:  Georgann Housekeeper, MD      Objective:  Physical Exam: warm, good capillary refill, no trophic changes or ulcerative lesions, normal DP and PT pulses, and normal sensory exam. Left Foot: dystrophic yellowed discolored nail plates with subungual debris Right Foot: dystrophic yellowed discolored nail plates with subungual debris  Assessment:   1. Pain due to onychomycosis of toenails of both feet   2. Blood clotting disorder (HCC)       Plan:  Patient was evaluated and treated and all questions answered.  -Mechanically debrided all nails 1-5 bilateral using sterile nail nipper and filed with dremel without incident  -Answered all patient questions -Patient to return  in 3 months for at risk foot care -Patient advised to call the office if any problems or questions arise in the meantime.    Return in about 3 months (around 07/24/2023) for rfc.

## 2023-04-24 ENCOUNTER — Telehealth: Payer: Self-pay | Admitting: Cardiology

## 2023-04-24 DIAGNOSIS — I878 Other specified disorders of veins: Secondary | ICD-10-CM

## 2023-04-24 DIAGNOSIS — I5032 Chronic diastolic (congestive) heart failure: Secondary | ICD-10-CM

## 2023-04-24 DIAGNOSIS — Z79899 Other long term (current) drug therapy: Secondary | ICD-10-CM

## 2023-04-24 NOTE — Telephone Encounter (Signed)
I spoke with the pt and she last saw Gillian Shields NP on 5//28/24.. her note:   Diastolic heart failure/ LE edema / Severe MR -echo 06/05/2022 normal LVEF, MVP, severe MR/TR, severely elevated PASP.  Not surgical candidate.  Dry weight at home 102 lbs. Home weight now 99 lbs. Weight today in clinic 100lbs which is down 6 lbs from last clinic visit  Reduce Lasix 20mg  to 20mg  M, W, F with additional tablet PRN for weight >102 lbs. Continue Spironolactone 12.5mg  QD.  Update BMP today. Future considerations include Farxiga. Low sodium diet, fluid restriction <2L, and daily weights encouraged. Educated to contact our office for weight gain of 2 lbs overnight or 5 lbs in one week.   Pt says since that visit.Marland Kitchen she has not had any change in her weight, she is staying at 100 lbs But, she has had increased edema in her lower extremities up to her knees.. she can wear her normal shoes but they are very tight. She is not SOB and can still lay flat at night to sleep.   She has been consistently been taking Lasix 40 mg TIW due to the edema. She is urinating quite a bit.  She does not know her BP and has been watching her NA intake and wearing compression stockings during the day. She says elevating them does not help.   I will forward to Dr Shari Prows for her review.   ( I did not change her med list at this point until after Dr Devin Going recommendations).

## 2023-04-24 NOTE — Telephone Encounter (Signed)
Pt c/o swelling: STAT is pt has developed SOB within 24 hours  How much weight have you gained and in what time span?  No weight gain   If swelling, where is the swelling located?  Legs   Are you currently taking a fluid pill?  Takes Lasix and Spironolactone  Are you currently SOB?  No   Do you have a log of your daily weights (if so, list)?  No   Have you gained 3 pounds in a day or 5 pounds in a week?   Have you traveled recently?  No

## 2023-04-25 NOTE — Telephone Encounter (Signed)
August, Gosser - 04/24/2023  3:12 PM Meriam Sprague, MD  Sent: Thu April 25, 2023  8:18 AM  To: Bertram Millard, RN; Loa Socks, LPN         Message  She has significant venous stasis. We have tried to increase her lasix significantly before and just managed to dry her out but not help her edema significantly. She has been seen by vein and vascular as well and they may be able to do an ablation.    We can get her in for labs (BMET and BNP) to see where she is from a HF standpoint but unfortunately, we have had issues getting the fluid off likely because most of it is from venous stasis and not from her heart.  ----- Message -----  From: Bertram Millard, RN  Sent: 04/24/2023   4:21 PM EDT  To: Loa Socks, LPN; Meriam Sprague, MD     Called the pt back and endorsed to her recommendations per Dr. Shari Prows, as indicated above.    Pt will see [facility] for follow-up and management of venous stasis, on 7/1.  Pt is aware that we can check her labs (BMP/BNP) to see where she is at from a HF perspective, for Dr. Shari Prows feels her issues are mostly coming from venous stasis, if the labs come back stable.  Scheduled the pt to come into the office to have labs done to check a BMET and PRO-BNP on tomorrow 6/14.  Pt verbalized understanding and agrees with this plan.

## 2023-04-26 ENCOUNTER — Ambulatory Visit: Payer: Medicare Other | Attending: Cardiology

## 2023-04-26 DIAGNOSIS — I878 Other specified disorders of veins: Secondary | ICD-10-CM | POA: Diagnosis not present

## 2023-04-26 DIAGNOSIS — I5032 Chronic diastolic (congestive) heart failure: Secondary | ICD-10-CM

## 2023-04-26 DIAGNOSIS — Z79899 Other long term (current) drug therapy: Secondary | ICD-10-CM | POA: Diagnosis not present

## 2023-04-27 LAB — BASIC METABOLIC PANEL
BUN/Creatinine Ratio: 32 — ABNORMAL HIGH (ref 12–28)
BUN: 31 mg/dL — ABNORMAL HIGH (ref 8–27)
CO2: 24 mmol/L (ref 20–29)
Calcium: 9.6 mg/dL (ref 8.7–10.3)
Chloride: 99 mmol/L (ref 96–106)
Creatinine, Ser: 0.96 mg/dL (ref 0.57–1.00)
Glucose: 104 mg/dL — ABNORMAL HIGH (ref 70–99)
Potassium: 3.9 mmol/L (ref 3.5–5.2)
Sodium: 138 mmol/L (ref 134–144)
eGFR: 58 mL/min/{1.73_m2} — ABNORMAL LOW (ref >59)

## 2023-04-27 LAB — PRO B NATRIURETIC PEPTIDE: NT-Pro BNP: 419 pg/mL (ref 0–738)

## 2023-05-13 ENCOUNTER — Ambulatory Visit (HOSPITAL_COMMUNITY)
Admission: RE | Admit: 2023-05-13 | Discharge: 2023-05-13 | Disposition: A | Discharge status: Home / Self Care | Payer: Medicare Other | Source: Ambulatory Visit | Attending: Surgery | Admitting: Surgery

## 2023-05-13 ENCOUNTER — Ambulatory Visit (INDEPENDENT_AMBULATORY_CARE_PROVIDER_SITE_OTHER): Payer: Medicare Other | Admitting: Surgery

## 2023-05-13 ENCOUNTER — Encounter: Payer: Self-pay | Admitting: Surgery

## 2023-05-13 VITALS — BP 139/80 | HR 65 | Temp 98.0°F | Resp 20 | Ht 59.0 in | Wt 99.0 lb

## 2023-05-13 DIAGNOSIS — R52 Pain, unspecified: Secondary | ICD-10-CM | POA: Diagnosis not present

## 2023-05-13 DIAGNOSIS — I83893 Varicose veins of bilateral lower extremities with other complications: Secondary | ICD-10-CM

## 2023-05-13 NOTE — Progress Notes (Signed)
[facility] [place]  Patient name: [name] MRN: [id number] DOB: 01-06-1936 Sex: female   REASON FOR VISIT:    Follow up  HISOTRY OF PRESENT ILLNESS:    [name] is a 87 y.o. female who was seen in the [facility] clinic in March 2024 for bilateral leg swelling left greater than right.  This been present for several years but has gotten worse.  The swelling cause these an aching pain in her legs.  She has also noticed a progressive discoloration of her lower legs.  She has been wearing compression socks which do benefit her.  She tries to keep her feet elevated but has difficulty with this because of her lower back issues.  She does report a history of DVT in the left leg on 2 separate occasions.  She also was managed for diastolic heart failure with diuretics however she states that her diuretics have not been helping.  She is on Eliquis for atrial fibrillation.  She takes a statin for hypercholesterolemia.  She is medically managed for hypertension.  She is a non-smoker.   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/20/2011   R breast DCIS, ER/PR +   Cataract 3 and 10/92   bilateral   Chronic diastolic CHF (congestive heart failure) (HCC)    Compression fracture of fourth lumbar vertebra (HCC)    DVT (deep venous thrombosis) (HCC) 08/2011   LL extremity    Fibromyalgia    Fracture lumbar vertebra-closed (HCC)    Fracture of thoracic vertebra, closed (HCC)    GERD (gastroesophageal reflux disease)    H/O hiatal hernia    History of blood clots    History of radiation therapy 01/2012   R breast   Hypercholesteremia    Hypertension    DR Terrilee Files   Hypothyroidism    Mitral regurgitation    Mitral valve prolapse    Osteoporosis    PAF (paroxysmal atrial fibrillation) (HCC)    a. dx 11/2016.   Pulmonary hypertension (HCC)     Rib fractures    Thoracic ascending aortic aneurysm (HCC)    a. followed by Dr. Donata Clay.   Tricuspid regurgitation      FAMILY HISTORY:   Family History  Problem Relation Age of Onset   Heart disease Mother    Heart disease Maternal Uncle    Heart disease Maternal Grandfather     SOCIAL HISTORY:   Social History   Tobacco Use   Smoking status: Never   Smokeless tobacco: Never  Substance Use Topics   Alcohol use: No     ALLERGIES:   Allergies  Allergen Reactions   Contrast Media [Iodinated Contrast Media] Shortness Of Breath   Iohexol Shortness Of Breath   Mucinex [Guaifenesin Er] Other (See Comments)    DIZZINESS   Zanaflex [Tizanidine Hcl] Anxiety and Other (See Comments)    Dizziness   Penicillins Rash    Has patient had a PCN reaction causing immediate rash, facial/tongue/throat swelling, SOB or lightheadedness with hypotension: No Has patient had a PCN reaction causing severe rash involving mucus membranes or skin necrosis: No Has patient had a PCN reaction that required hospitalization No Has patient had a PCN reaction occurring within the last 10 years: No If all of the above answers are "NO", then may proceed with Cephalosporin use.  Robaxin [Methocarbamol] Diarrhea     CURRENT MEDICATIONS:   Current Outpatient Medications  Medication Sig Dispense Refill   amiodarone (PACERONE) 200 MG tablet Take 1 tablet (200 mg total) by mouth daily. 90 tablet 3   apixaban (ELIQUIS) 2.5 MG TABS tablet Take 1 tablet (2.5 mg total) by mouth 2 (two) times daily. 60 tablet 5   Ascorbic Acid (VITA-C PO) Take 1,000 mg by mouth daily.     calcium citrate-vitamin D (CITRACAL+D) 315-200 MG-UNIT per tablet Take 1 tablet by mouth 2 (two) times daily. 60 tablet 11   diltiazem (CARDIZEM CD) 120 MG 24 hr capsule Take 1 capsule (120 mg total) by mouth daily. 90 capsule 3   estradiol (ESTRACE) 0.1 MG/GM vaginal cream Place 1 Applicatorful vaginally once a week.     furosemide  (LASIX) 20 MG tablet take Furosemide on Mondays, Wednesdays, Fridays. May take an extra tablet as needed for weight of more than 102 lbs. (Patient taking differently: Take 20 mg by mouth daily. take Furosemide on Mondays, Wednesdays, Fridays. May take an extra tablet as needed for weight of more than 102 lbs.) 90 tablet 3   HYDROcodone-acetaminophen (NORCO/VICODIN) 5-325 MG tablet Take 1-2 tablets by mouth every 6 (six) hours as needed (mild pain). 20 tablet 0   iron polysaccharides (NIFEREX) 150 MG capsule Take 1 capsule (150 mg total) by mouth 3 (three) times a week. M, W, F 30 capsule 0   levothyroxine (SYNTHROID) 125 MCG tablet Take 1 tablet (125 mcg total) by mouth daily before breakfast. 30 tablet 1   Meclizine HCl (BONINE PO) Take by mouth as needed.     Multiple Vitamin (MULTIVITAMIN) tablet Take 1 tablet by mouth daily with breakfast.     omeprazole (PRILOSEC) 20 MG capsule Take 1 capsule (20 mg total) by mouth daily. 30 capsule 11   OVER THE COUNTER MEDICATION Take 1,000 mg by mouth daily. D-Mannose 500 mg     pravastatin (PRAVACHOL) 20 MG tablet Take 1 tablet (20 mg total) by mouth daily with supper. 30 tablet 1   risedronate (ACTONEL) 150 MG tablet Take 150 mg by mouth every 30 (thirty) days. with water on empty stomach, nothing by mouth or lie down for next 30 minutes.     senna-docusate (SENOKOT-S) 8.6-50 MG tablet Take 1 tablet by mouth 2 (two) times daily. 60 tablet 1   solifenacin (VESICARE) 10 MG tablet Take 1 tablet (10 mg total) by mouth daily. 30 tablet 1   spironolactone (ALDACTONE) 25 MG tablet Take 0.5 tablets (12.5 mg total) by mouth daily. 45 tablet 1   No current facility-administered medications for this visit.    REVIEW OF SYSTEMS:   [X]  denotes positive finding, [ ]  denotes negative finding Cardiac  Comments:  Chest pain or chest pressure:    Shortness of breath upon exertion:    Short of breath when lying flat:    Irregular heart rhythm:        Vascular     Pain in calf, thigh, or hip brought on by ambulation:    Pain in feet at night that wakes you up from your sleep:     Blood clot in your veins:    Leg swelling:  x       Pulmonary    Oxygen at home:    Productive cough:     Wheezing:         Neurologic    Sudden weakness in arms or legs:     Sudden  numbness in arms or legs:     Sudden onset of difficulty speaking or slurred speech:    Temporary loss of vision in one eye:     Problems with dizziness:         Gastrointestinal    Blood in stool:     Vomited blood:         Genitourinary    Burning when urinating:     Blood in urine:        Psychiatric    Major depression:         Hematologic    Bleeding problems:    Problems with blood clotting too easily:        Skin    Rashes or ulcers:        Constitutional    Fever or chills:      PHYSICAL EXAM:   Vitals:   05/13/23 1106  BP: 139/80  Pulse: 65  Resp: 20  Temp: 98 F (36.7 C)  SpO2: 96%  Weight: 99 lb (44.9 kg)  Height: 4\' 11"  (1.499 m)    GENERAL: The patient is a well-nourished female, in no acute distress. The vital signs are documented above. CARDIAC: There is a regular rate and rhythm.  VASCULAR: Significant hyperpigmentation of both legs, left greater than right.  SonoSite was used to evaluate the left saphenous vein which was dilated and straight PULMONARY: Non-labored respirations ABDOMEN: Soft and non-tender with normal pitched bowel sounds.  MUSCULOSKELETAL: There are no major deformities or cyanosis. NEUROLOGIC: No focal weakness or paresthesias are detected. SKIN: There are no ulcers or rashes noted. PSYCHIATRIC: The patient has a normal affect.  STUDIES:   I have reviewed the following: ABI/TBIToday's ABIToday's TBIPrevious ABIPrevious TBI  +-------+-----------+-----------+------------+------------+  Right 1.04       0.76                                 +-------+-----------+-----------+------------+------------+  Left   1.16       0.59                                 +-------+-----------+-----------+------------+------------+   Venous reflux:  LEFT          Reflux NoRefluxReflux TimeDiameter cmsComments                          Yes                                   +--------------+---------+------+-----------+------------+--------+  CFV                    yes   >1 second                       +--------------+---------+------+-----------+------------+--------+  FV mid        no                                              +--------------+---------+------+-----------+------------+--------+  Popliteal    no                                              +--------------+---------+------+-----------+------------+--------+  GSV at [facility]              yes    >500 ms      0.76              +--------------+---------+------+-----------+------------+--------+  GSV prox thigh          yes    >500 ms      0.6               +--------------+---------+------+-----------+------------+--------+  GSV mid thigh           yes    >500 ms      0.53              +--------------+---------+------+-----------+------------+--------+  GSV dist thigh          yes    >500 ms      0.44              +--------------+---------+------+-----------+------------+--------+  GSV at knee             yes    >500 ms      0.47              +--------------+---------+------+-----------+------------+--------+  GSV prox calf           yes    >500 ms      0.68              +--------------+---------+------+-----------+------------+--------+  GSV mid calf            yes    >500 ms      0.55              +--------------+---------+------+-----------+------------+--------+  SSV Pop Fossa no                            0.28              +--------------+---------+------+-----------+------------+--------+  SSV prox calf no                            0.26               +--------------+---------+------+-----------+------------+--------+    MEDICAL ISSUES:   CEAP Class 4: This is a somewhat unique situation.  The patient appears to be euvolemic from a heart failure but has significant problems with leg swelling especially on the left.  She also has significant hyperpigmentation.  She feels that her leg swelling keeps her from walking without a walker.  Is very concerning and bothersome to her.  I think we could safely perform laser ablation of the left saphenous vein.  She would have to be off of her Eliquis for 2 days prior.  I told her that I think this would improve her leg symptoms but likely would not alleviate all of them she wants to think about this before committing.  I will have Sonya reach out to her for further discussions.   Charlena Cross, MD, FACS [facility] [phone] Pager [phone]

## 2023-05-14 LAB — VAS US ABI WITH/WO TBI
Left ABI: 1.16
Right ABI: 1.04

## 2023-05-21 ENCOUNTER — Ambulatory Visit: Payer: Medicare Other | Admitting: Nurse Practitioner

## 2023-06-04 ENCOUNTER — Encounter (HOSPITAL_BASED_OUTPATIENT_CLINIC_OR_DEPARTMENT_OTHER): Payer: Self-pay | Admitting: Family

## 2023-06-04 ENCOUNTER — Ambulatory Visit (INDEPENDENT_AMBULATORY_CARE_PROVIDER_SITE_OTHER): Payer: Medicare Other | Admitting: Family

## 2023-06-04 VITALS — BP 108/80 | HR 70 | Ht 59.0 in | Wt 99.3 lb

## 2023-06-04 DIAGNOSIS — R600 Localized edema: Secondary | ICD-10-CM

## 2023-06-04 DIAGNOSIS — I878 Other specified disorders of veins: Secondary | ICD-10-CM | POA: Diagnosis not present

## 2023-06-04 DIAGNOSIS — D6859 Other primary thrombophilia: Secondary | ICD-10-CM

## 2023-06-04 DIAGNOSIS — I5032 Chronic diastolic (congestive) heart failure: Secondary | ICD-10-CM | POA: Diagnosis not present

## 2023-06-04 DIAGNOSIS — I340 Nonrheumatic mitral (valve) insufficiency: Secondary | ICD-10-CM | POA: Diagnosis not present

## 2023-06-04 DIAGNOSIS — I480 Paroxysmal atrial fibrillation: Secondary | ICD-10-CM | POA: Diagnosis not present

## 2023-06-04 DIAGNOSIS — Z79899 Other long term (current) drug therapy: Secondary | ICD-10-CM

## 2023-06-04 NOTE — Patient Instructions (Signed)
Medication Instructions:  Your physician recommends that you continue on your current medications as directed. Please refer to the Current Medication list given to you today.  *If you need a refill on your cardiac medications before your next appointment, please call your pharmacy*   Lab Work: Your physician recommends that you return for lab work today- BMP   Follow-Up: At [facility], you and your health needs are our priority.  As part of our continuing mission to provide you with exceptional heart care, we have created designated Provider Care Teams.  These Care Teams include your primary Cardiologist (physician) and Advanced Practice Providers (APPs -  Physician Assistants and Nurse Practitioners) who all work together to provide you with the care you need, when you need it.  We recommend signing up for the patient portal called "MyChart".  Sign up information is provided on this After Visit Summary.  MyChart is used to connect with patients for Virtual Visits (Telemedicine).  Patients are able to view lab/test results, encounter notes, upcoming appointments, etc.  Non-urgent messages can be sent to your provider as well.   To learn more about what you can do with MyChart, go to [email or web address].    Your next appointment:   3-4 months with Dr. Cristal Deer or Gillian Shields, NP   To prevent or reduce lower extremity swelling: Eat a low salt diet. Salt makes the body hold onto extra fluid which causes swelling. Sit with legs elevated. For example, in the recliner or on an ottoman.  Wear knee-high compression stockings during the daytime. Ones labeled 15-20 mmHg provide good compression.

## 2023-06-04 NOTE — Progress Notes (Signed)
Cardiology Office Note:  .   Date:  06/04/2023  ID:  [name], DOB [birth year], MRN [id number] PCP: Annette Housekeeper, MD  [facility] Providers Cardiologist:  Annette Sprague, MD   ? Dr. Cristal [name] History of Present Illness: .   [name] is a 87 y.o. female  with a hx of chronic diastolic heart failure, severe MR (not surgical candidate), atrial fibrillation, thoracic aortic aneurysm, LLE DVT, hiatal hernia last seen 04/09/23.   Previously followed with Dr. Delton [name] has since established with Dr. Shari [name].  TTE 11/2016 LVEF 55 to 60%, mild to moderate AI, moderate MR, severe atrial enlargement, severe TR.  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  Known paroxysmal atrial fibrillation s/p spontaneous conversion to NSR on diltiazem and flecainide.  Myoview 01/2017 poor quality but low risk with EF 72%.  Echo 07/2018 EF 55 to 60%, grade 1 diastolic dysfunction, mildly dilated ascending aorta with moderate MVP with MR and moderate to severe dilated LA, moderate TR, moderate pulmonary hypertension.  Evaluated Dr. Excell [name] 06/05/2021 deemed best for medical management given relative lack of symptoms and comorbidities. 05/2022 LVEF 60 to 65%, severe RV enlargement with preserved RV function, severe MR with mild to moderate AR and severe aortic dilation 50 mm, severe TR.  Not surgical candidate due to severe scoliosis.   Admitted 11/2022 for acute on chronic diastolic heart failure exacerbation and atrial fibrillation with RVR.  Losartan discontinued due to soft blood pressure.  She was diuresed and placed on p.o. amiodarone in addition to apixaban, metoprolol, diltiazem.  Previously on torsemide but felt Lasix worked better and was transition back to Lasix 01/23/2023.   At visit 03/08/23 for LE edema recommended to take Lasix 20mg  QD until weight 102 lbs then return to M, W, F. Potassium discontinued and she was started on  Spironolactone 12.5mg  QD. Diltiazem reduced due to bradycardia.   Last seen 04/09/23 with Lasix reduced to 20 mg 3 times per week for protection of renal function and to avoid hypotension.  She saw Dr. Myra [name] of [facility] 04/23/2023 and was offered laser ablation of the left saphenous vein.  Presents today for follow up. Weight at home stable 99 lbs - 100 lbs. She has been taking Lasix daily instead of 3 times per week. Thinking about laser ablation of L saphenous vein.  We discussed again that simply increasing her fluid pill would not improve her lower extremity edema as she is euvolemic from heart failure standpoint and her prior LLE DVT as well as venous insufficiency are contributory to her swelling. Has not been wearing compression stockings consistently.   Tells me she feels unsteady on her feet despite continuing her home health PT exercises as previously instructed.  We discussed possible referral to outpatient physical therapy which she wishes to consider.  She has a caregiver with her during the daytime and through Friday who would be able to drive her. Still bothersome.   Reports no shortness of breath nor dyspnea on exertion. Reports no chest pain, pressure, or tightness. No orthopnea, PND. Reports no palpitations.    ROS: Please [name] the history of present illness.    All other systems reviewed and are negative.   Studies Reviewed: .        Cardiac Studies & Procedures     STRESS TESTS  MYOCARDIAL PERFUSION IMAGING 01/29/2017  Narrative  The nuclear study is very poor  quality due to body habitus  Defect 1: There is a defect present in the basal inferolateral and mid inferolateral location that is most likely artifact due to uptake in structures below the diaphragm.  This is a low risk study.  Nuclear stress EF: 72%. The left ventricular ejection fraction is hyperdynamic (>65%).   ECHOCARDIOGRAM  ECHOCARDIOGRAM COMPLETE 06/05/2022  Narrative ECHOCARDIOGRAM  REPORT    Patient Name:   [name] Date of Exam: 06/05/2022 Medical Rec #:  [id number]          Height:       59.0 in Accession #:    [id number]         Weight:       98.8 lb Date of Birth:  [birth year]          BSA:          1.367 m Patient Age:    86 years           BP:           130/90 mmHg Patient Gender: F                  HR:           74 bpm. Exam Location:  [address]  Procedure: 2D Echo, Cardiac Doppler and Color Doppler  Indications:    I34.0 Mitral Valve Insufficiency  History:        Patient has prior history of Echocardiogram examinations, most recent 11/17/2021. CHF, Mitral Valve Prolapse, Mitral Valve Disease, Aortic Valve Disease and Tricuspid Insufficiency, Arrythmias:Atrial Fibrillation; Risk Factors:Hypertension, Dyslipidemia and Family History of Coronary Artery Disease. Ascending Aortic Aneurysm, Right Breast Cancer status post Partial Mastectomy and Radiation.  Sonographer:    [name] RDCS Referring Phys: [name]  IMPRESSIONS   1. Left ventricular ejection fraction, by estimation, is 60 to 65%. The left ventricle has normal function. The left ventricle has no regional wall motion abnormalities. There is severe asymmetric left ventricular hypertrophy of the basal-septal segment. Left ventricular diastolic parameters are indeterminate. 2. There is systolic anterior motion of the mitral valve but most of MR is related to atrial dilation and bi-leaflet prolapse. 3. Right ventricular systolic function is normal. The right ventricular size is severely enlarged. There is severely elevated pulmonary artery systolic pressure. The estimated right ventricular systolic pressure is 74.9 mmHg. 4. Left atrial size was severely dilated. 5. Right atrial size was severely dilated. 6. The mitral valve is abnormal. Severe mitral valve regurgitation. 7. Tricuspid valve regurgitation is severe. 8. The aortic valve is tricuspid. There is mild  calcification of the aortic valve. Aortic valve regurgitation is mild to moderate. No aortic stenosis is present. 9. Aortic dilatation noted. There is severe dilatation of the ascending aorta, measuring 50 mm. There is moderate dilatation of the aortic arch, measuring 45 mm.  Comparison(s): Further aortic dilation, worsening tricuspid and mitral regurgitation.  FINDINGS Left Ventricle: Left ventricular ejection fraction, by estimation, is 60 to 65%. The left ventricle has normal function. The left ventricle has no regional wall motion abnormalities. The left ventricular internal cavity size was normal in size. There is severe asymmetric left ventricular hypertrophy of the basal-septal segment. Left ventricular diastolic function could not be evaluated due to mitral regurgitation (moderate or greater). Left ventricular diastolic parameters are indeterminate.  Right Ventricle: The right ventricular size is severely enlarged. No increase in right ventricular wall thickness. Right ventricular systolic function is normal. There is severely elevated pulmonary  artery systolic pressure. The tricuspid regurgitant velocity is 3.87 m/s, and with an assumed right atrial pressure of 15 mmHg, the estimated right ventricular systolic pressure is 74.9 mmHg.  Left Atrium: Left atrial size was severely dilated.  Right Atrium: Right atrial size was severely dilated.  Pericardium: There is no evidence of pericardial effusion.  Mitral Valve: The mitral valve is abnormal. Severe mitral valve regurgitation.  Tricuspid Valve: The tricuspid valve is normal in structure. Tricuspid valve regurgitation is severe. No evidence of tricuspid stenosis.  Aortic Valve: The aortic valve is tricuspid. There is mild calcification of the aortic valve. Aortic valve regurgitation is mild to moderate. Aortic regurgitation PHT measures 559 msec. No aortic stenosis is present.  Pulmonic Valve: The pulmonic valve was normal in  structure. Pulmonic valve regurgitation is mild. No evidence of pulmonic stenosis.  Aorta: Aortic dilatation noted. There is severe dilatation of the ascending aorta, measuring 50 mm. There is moderate dilatation of the aortic arch, measuring 45 mm.  IAS/Shunts: No atrial level shunt detected by color flow Doppler.   LEFT VENTRICLE PLAX 2D LVIDd:         5.10 cm   Diastology LVIDs:         2.90 cm   LV e' medial:    5.44 cm/s LV PW:         1.00 cm   LV E/e' medial:  14.8 LV IVS:        1.20 cm   LV e' lateral:   7.77 cm/s LVOT diam:     2.00 cm   LV E/e' lateral: 10.3 LV SV:         78 LV SV Index:   57 LVOT Area:     3.14 cm   RIGHT VENTRICLE RV Basal diam:  4.80 cm RV Mid diam:    3.30 cm RV S prime:     29.20 cm/s TAPSE (M-mode): 3.3 cm  LEFT ATRIUM              Index         RIGHT ATRIUM           Index LA diam:        4.50 cm  3.29 cm/m    RA Area:     35.60 cm LA Vol (A2C):   119.0 ml 87.07 ml/m   RA Volume:   122.00 ml 89.27 ml/m LA Vol (A4C):   144.0 ml 105.36 ml/m LA Biplane Vol: 132.0 ml 96.58 ml/m AORTIC VALVE LVOT Vmax:   147.80 cm/s LVOT Vmean:  91.060 cm/s LVOT VTI:    0.250 m AI PHT:      559 msec  AORTA Ao Root diam: 4.40 cm Ao Asc diam:  4.95 cm  MITRAL VALVE                 TRICUSPID VALVE MV Area (PHT)  cm           TR Peak grad:   59.9 mmHg MV Decel Time: 237 msec      TR Vmax:        387.00 cm/s MR Peak grad:   129.0 mmHg MR Mean grad:   86.0 mmHg    SHUNTS MR Vmax:        568.00 cm/s  Systemic VTI:  0.25 m MR Vmean:       442.5 cm/s   Systemic Diam: 2.00 cm MR PISA:        9.05 cm MR PISA Radius:  1.20 cm MV E velocity: 80.40 cm/s MV A velocity: 117.50 cm/s MV E/A ratio:  0.68  Riley Lam MD Electronically signed by Riley Lam MD Signature Date/Time: 06/05/2022/11:21:15 AM    Final   TEE  ECHO TEE 12/04/2016  Narrative *[facility]* *[facility]* [address] [place], [place]  [address] [phone]  ------------------------------------------------------------------- Transesophageal Echocardiography  Patient:    Darlisha, Kelm MR #:       [id number] Study Date: 12/04/2016 Gender:     F Age:        73 Height:     149.9 cm Weight:     48.5 kg BSA:        1.43 m^2 Pt. Status: Room:       2W15C  ADMITTING    Maree Krabbe ORDERING     Hillis Range, MD REFERRING    Hillis Range, MD PERFORMING   Zoila Shutter MD ATTENDING    Alison Murray SONOGRAPHER  Lysbeth Galas, RDCS  cc:  ------------------------------------------------------------------- LV EF: 60% -   65%  ------------------------------------------------------------------- Indications:      Mitral regurgitation 424.0.  ------------------------------------------------------------------- History:   PMH:   Dyspnea.  Risk factors:  Hypertension.  ------------------------------------------------------------------- Study Conclusions  - Left ventricle: There was mild concentric hypertrophy. Systolic function was normal. The estimated ejection fraction was in the range of 60% to 65%. Wall motion was normal; there were no regional wall motion abnormalities. - Aortic valve: Sclerosis with mild AI. - Aorta: Dilated. Ascending aortic diameter: 45 mm (S). - Mitral valve: Partially flail P2 segment with moderate, posteriorly directed regurgitation. Effective regurgitant orifice (PISA): 0.33 cm^2. Regurgitant volume (PISA): 40 ml. - Left atrium: Moderately dilated. No evidence of thrombus in the atrial cavity or appendage. - Right atrium: No evidence of thrombus in the atrial cavity or appendage. - Atrial septum: No defect or patent foramen ovale was identified. - Pulmonic valve: No evidence of vegetation.  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 LVEF 60-65%, moderate LAE, no LAA  thrombus.  ------------------------------------------------------------------- Study data:   Study status:  Routine.  Consent:  The risks, benefits, and alternatives to the procedure were explained to the patient and informed consent was obtained.  Procedure:  The patient reported no pain pre or post test. Initial setup. The patient was brought to the laboratory. Surface ECG leads were monitored. Sedation. Conscious sedation was administered by cardiology staff. Transesophageal echocardiography. An adult multiplane transesophageal probe was inserted by the attending cardiologistwithout difficulty. Image quality was adequate. Intravenous contrast (agitated saline) was administered.  Study completion:  The patient tolerated the procedure well. There were no complications.  Administered medications:   Midazolam, 4mg , IV. Fentanyl, , IV.          Diagnostic transesophageal echocardiography.  2D and color Doppler.  Birthdate:  Patient birthdate: 1936/09/18.  Age:  Patient is 87 yr old.  Sex:  Gender: female.    BMI: 21.6 kg/m^2.  Blood pressure:     136/88  Patient status:  Inpatient.  Study date:  Study date: 12/04/2016. Study time: 07:59 AM.  Location:  Endoscopy.  -------------------------------------------------------------------  ------------------------------------------------------------------- Left ventricle:  There was mild concentric hypertrophy. Systolic function was normal. The estimated ejection fraction was in the range of 60% to 65%. Wall motion was normal; there were no regional wall motion abnormalities.  ------------------------------------------------------------------- Aortic valve:  Sclerosis with mild AI.  -------------------------------------------------------------------  Aorta:  Dilated.  ------------------------------------------------------------------- Mitral valve:  Partially flail P2 segment with moderate, posteriorly directed  regurgitation.  ------------------------------------------------------------------- Left atrium:  Moderately dilated.  No evidence of thrombus in the atrial cavity or appendage.  ------------------------------------------------------------------- Atrial septum:  No defect or patent foramen ovale was identified.  ------------------------------------------------------------------- Pulmonary veins:  no anomaly.  ------------------------------------------------------------------- Pulmonic valve:    Structurally normal valve.   Cusp separation was normal.  No evidence of vegetation.  ------------------------------------------------------------------- Tricuspid valve:   Doppler:  There was mild regurgitation.  ------------------------------------------------------------------- Pulmonary artery:   The main pulmonary artery was normal-sized.  ------------------------------------------------------------------- Right atrium:  The atrium was normal in size.  No evidence of thrombus in the atrial cavity or appendage.  ------------------------------------------------------------------- Pericardium:  There was no pericardial effusion.  ------------------------------------------------------------------- Post procedure conclusions Ascending Aorta:  - Dilated.  ------------------------------------------------------------------- Measurements  Aorta                                Value Ascending aorta ID, A-P, S           45    mm  Mitral valve                         Value Mitral regurg VTI, PISA              121   cm Mitral ERO, PISA                     0.33  cm^2 Mitral regurg volume, PISA           40    ml  Tricuspid valve                      Value Tricuspid regurg peak velocity       226   cm/s Tricuspid peak RV-RA gradient        20    mm Hg  Legend: (L)  and  (H)  mark values outside specified reference  range.  ------------------------------------------------------------------- Prepared and Electronically Authenticated by  Zoila Shutter MD 2018-01-24T16:46:38            Risk Assessment/Calculations:      CHA2DS2-VASc Score = 6   This indicates a 9.7% annual risk of stroke. The patient's score is based upon: CHF History: 1 HTN History: 1 Diabetes History: 0 Stroke History: 0 Vascular Disease History: 1 Age Score: 2 Gender Score: 1             Physical Exam:   VS:  BP 108/80 Comment: home BP  Pulse 70   Ht 4\' 11"  (1.499 m)   Wt 99 lb 4.8 oz (45 kg)   SpO2 96%   BMI 20.06 kg/m    Wt Readings from Last 3 Encounters:  06/04/23 99 lb 4.8 oz (45 kg)  05/13/23 99 lb (44.9 kg)  04/09/23 100 lb (45.4 kg)    GEN: Well nourished, thin, well developed in no acute distress NECK: No JVD; No carotid bruits CARDIAC: RRR, no murmurs, rubs, gallops RESPIRATORY:  Clear to auscultation without rales, wheezing or rhonchi  ABDOMEN: Soft, non-tender, non-distended EXTREMITIES:  Extreme kyphosis; LLE with 1+ pitting edema, no RLE edema, bilateral venous stasis color changes  ASSESSMENT AND PLAN: .   Diastolic heart failure/ LE edema / Severe MR -echo 06/05/2022 normal LVEF, MVP, severe MR/TR, severely elevated PASP.  Not  surgical candidate.  Dry weight at home 99 lbs - 100 lbs and maintaining. Euvolemic and well compensated on exam. She self increased Lasix from 3x per week to daily. Update BMP. Reiterated that due to prior LLE DVT, venous insufficiency her LE edema is multifactorial and will not resolve with further increased diuresis. Hesitant to further increase Lasix due to relative hypotension at home with SBP often 110s Future considerations include Marcelline Deist however will defer due to relative hypotension at home.  Low sodium diet, fluid restriction <2L, and daily weights encouraged. Educated to contact our office for weight gain of 2 lbs overnight or 5 lbs in one week.    Fatigue -  Reports persistent sensation of leg weakness despite completion of home health PT.  Offered referral to outpatient PT which she politely declines today.  CBC, thyroid panel previously updated with no causes noted for her fatigue.  Recommended follow-up with primary care provider.     PAF/on amiodarone therapy/hypercoagulable state -bradycardia resolved after reduced dose diltiazem and discontinuation of metoprolol.  Continue amiodarone 200mg  every day. CHA2DS2-VASc Score = 6 [CHF History: 1, HTN History: 1, Diabetes History: 0, Stroke History: 0, Vascular Disease History: 1, Age Score: 2, Gender Score: 1].  Therefore, the patient's annual risk of stroke is 9.7 %.    Continue Eliquis 2.5 mg twice daily.  Reduced dose due to age, renal function. Amiodarone monitoring: 03/2023 stable thyroid.  12/2022 AST 24 ALT 21.  No evidence of toxicity.   Aortic atherosclerosis / HLD - Continue Pravastatin. Stable with no anginal symptoms. No indication for ischemic evaluation.     Venous reflux - Appt in July to discuss possible saphenous vein ablation with [facility]. Venous insufficiency contributory to LE edema, continue compression stockings, leg elevation.   Hypothyroidism -Continue to follow with PCP. Stable on present dose levothyroxine 03/2023.    GERD - Continue Omeprazole.Continue to follow with PCP.          Dispo: follow up 3-4 months with Dr. Cristal [name] or APP (transition to Dr. Cristal [name] due to Dr. Devin Going move)  Signed, Alver Sorrow, NP

## 2023-06-05 LAB — BASIC METABOLIC PANEL
BUN/Creatinine Ratio: 39 — ABNORMAL HIGH (ref 12–28)
BUN: 38 mg/dL — ABNORMAL HIGH (ref 8–27)
CO2: 26 mmol/L (ref 20–29)
Calcium: 9.7 mg/dL (ref 8.7–10.3)
Chloride: 97 mmol/L (ref 96–106)
Creatinine, Ser: 0.98 mg/dL (ref 0.57–1.00)
Glucose: 98 mg/dL (ref 70–99)
Potassium: 4.1 mmol/L (ref 3.5–5.2)
Sodium: 137 mmol/L (ref 134–144)
eGFR: 57 mL/min/{1.73_m2} — ABNORMAL LOW (ref >59)

## 2023-06-06 ENCOUNTER — Telehealth (HOSPITAL_BASED_OUTPATIENT_CLINIC_OR_DEPARTMENT_OTHER): Payer: Self-pay

## 2023-06-06 NOTE — Telephone Encounter (Signed)
Patient was returning call. Please advise ?

## 2023-06-06 NOTE — Telephone Encounter (Signed)
Results called to patient who verbalizes understanding!       ---- Message from Alver Sorrow sent at 06/05/2023  8:37 AM EDT ----- Stable kidney function and electrolytes. May continue Lasix 20mg  daily and Spironolactone 12.5mg  daily. Hesitant to further increase diuretics as likely not of much benefit and may lower blood pressure too far.

## 2023-06-06 NOTE — Telephone Encounter (Addendum)
Left message for patient to call back    ----- Message from Alver Sorrow sent at 06/05/2023  8:37 AM EDT ----- Stable kidney function and electrolytes. May continue Lasix 20mg  daily and Spironolactone 12.5mg  daily. Hesitant to further increase diuretics as likely not of much benefit and may lower blood pressure too far.

## 2023-07-01 ENCOUNTER — Ambulatory Visit: Payer: Medicare Other | Admitting: Nurse Practitioner

## 2023-07-16 ENCOUNTER — Other Ambulatory Visit (HOSPITAL_BASED_OUTPATIENT_CLINIC_OR_DEPARTMENT_OTHER): Payer: Self-pay | Admitting: Family

## 2023-07-16 DIAGNOSIS — I5032 Chronic diastolic (congestive) heart failure: Secondary | ICD-10-CM

## 2023-07-16 NOTE — Telephone Encounter (Signed)
Rx request sent to pharmacy.  

## 2023-07-17 DIAGNOSIS — Z961 Presence of intraocular lens: Secondary | ICD-10-CM | POA: Diagnosis not present

## 2023-07-17 DIAGNOSIS — H53002 Unspecified amblyopia, left eye: Secondary | ICD-10-CM | POA: Diagnosis not present

## 2023-07-17 DIAGNOSIS — H31003 Unspecified chorioretinal scars, bilateral: Secondary | ICD-10-CM | POA: Diagnosis not present

## 2023-07-24 ENCOUNTER — Ambulatory Visit (INDEPENDENT_AMBULATORY_CARE_PROVIDER_SITE_OTHER): Payer: Medicare Other | Admitting: Podiatry

## 2023-07-24 ENCOUNTER — Encounter: Payer: Self-pay | Admitting: Podiatry

## 2023-07-24 DIAGNOSIS — M79675 Pain in left toe(s): Secondary | ICD-10-CM

## 2023-07-24 DIAGNOSIS — B351 Tinea unguium: Secondary | ICD-10-CM

## 2023-07-24 DIAGNOSIS — M79674 Pain in right toe(s): Secondary | ICD-10-CM | POA: Diagnosis not present

## 2023-07-24 DIAGNOSIS — D689 Coagulation defect, unspecified: Secondary | ICD-10-CM | POA: Diagnosis not present

## 2023-07-24 NOTE — Progress Notes (Signed)
This patient presents to the office with chief complaint of long thick painful nails.  Patient says the nails are painful walking and wearing shoes.  This patient is unable to self treat.  This patient is unable to trim her nails since she is unable to reach her nails.  She presents for at risk care since she has coagulation defect due to taking eliquis.  She presents to the office for preventative foot care services.  General Appearance  Alert, conversant and in no acute stress.  Vascular  Dorsalis pedis and posterior tibial  pulses are  weaklypalpable  bilaterally.  Capillary return is within normal limits  bilaterally. Temperature is within normal limits  bilaterally.  Neurologic  Senn-Weinstein monofilament wire test within normal limits  bilaterally. Muscle power within normal limits bilaterally.  Nails Thick disfigured discolored nails with subungual debris  from hallux to fifth toes bilaterally. No evidence of bacterial infection or drainage bilaterally.  Orthopedic  No limitations of motion  feet .  No crepitus or effusions noted.  No bony pathology or digital deformities noted.  Skin  normotropic skin with no porokeratosis noted bilaterally.  No signs of infections or ulcers noted.     Onychomycosis  Nails  B/L.  Pain in right toes  Pain in left toes  Debridement of nails both feet followed trimming the nails with dremel tool.    RTC 3 months.   Helane Gunther DPM

## 2023-09-03 ENCOUNTER — Ambulatory Visit (HOSPITAL_BASED_OUTPATIENT_CLINIC_OR_DEPARTMENT_OTHER): Payer: Medicare Other | Admitting: Family

## 2023-09-03 ENCOUNTER — Encounter (HOSPITAL_BASED_OUTPATIENT_CLINIC_OR_DEPARTMENT_OTHER): Payer: Self-pay | Admitting: Family

## 2023-09-03 VITALS — BP 120/70 | HR 62 | Resp 16 | Ht 59.0 in | Wt 94.8 lb

## 2023-09-03 DIAGNOSIS — Z79899 Other long term (current) drug therapy: Secondary | ICD-10-CM | POA: Diagnosis not present

## 2023-09-03 DIAGNOSIS — I5033 Acute on chronic diastolic (congestive) heart failure: Secondary | ICD-10-CM

## 2023-09-03 DIAGNOSIS — I340 Nonrheumatic mitral (valve) insufficiency: Secondary | ICD-10-CM

## 2023-09-03 DIAGNOSIS — I480 Paroxysmal atrial fibrillation: Secondary | ICD-10-CM | POA: Diagnosis not present

## 2023-09-03 DIAGNOSIS — R600 Localized edema: Secondary | ICD-10-CM | POA: Diagnosis not present

## 2023-09-03 DIAGNOSIS — D6859 Other primary thrombophilia: Secondary | ICD-10-CM | POA: Diagnosis not present

## 2023-09-03 DIAGNOSIS — I872 Venous insufficiency (chronic) (peripheral): Secondary | ICD-10-CM | POA: Diagnosis not present

## 2023-09-03 MED ORDER — AMIODARONE HCL 200 MG PO TABS
200.0000 mg | ORAL_TABLET | Freq: Every day | ORAL | 3 refills | Status: AC
Start: 2023-09-03 — End: ?

## 2023-09-03 MED ORDER — APIXABAN 2.5 MG PO TABS
2.5000 mg | ORAL_TABLET | Freq: Two times a day (BID) | ORAL | 5 refills | Status: AC
Start: 2023-09-03 — End: ?

## 2023-09-03 NOTE — Patient Instructions (Signed)
Medication Instructions:  Your physician recommends that you continue on your current medications as directed. Please refer to the Current Medication list given to you today.  Your medications have been refilled for you at your preferred pharmacy.  *If you need a refill on your cardiac medications before your next appointment, please call your pharmacy*  Follow-Up: At [facility], you and your health needs are our priority.  As part of our continuing mission to provide you with exceptional heart care, we have created designated Provider Care Teams.  These Care Teams include your primary Cardiologist (physician) and Advanced Practice Providers (APPs -  Physician Assistants and Nurse Practitioners) who all work together to provide you with the care you need, when you need it.  We recommend signing up for the patient portal called "MyChart".  Sign up information is provided on this After Visit Summary.  MyChart is used to connect with patients for Virtual Visits (Telemedicine).  Patients are able to view lab/test results, encounter notes, upcoming appointments, etc.  Non-urgent messages can be sent to your provider as well.   To learn more about what you can do with MyChart, go to [email or web address].    Your next appointment:   Follow up with Dr. Cristal Deer in 5 months.

## 2023-09-03 NOTE — Progress Notes (Signed)
Cardiology Office Note:  .   Date:  09/03/2023  ID:  [name], DOB [birth year], MRN [id number] PCP: Georgann Housekeeper, MD  [facility] Providers Cardiologist:  Meriam Sprague, MD (Inactive)    History of Present Illness: .   [name] is a 87 y.o. female  with a hx of chronic diastolic heart failure, severe MR (not surgical candidate), atrial fibrillation, thoracic aortic aneurysm, LLE DVT, hiatal hernia.   Previously followed with Dr. Delton See has since established with Dr. Shari Prows.  TTE 11/2016 LVEF 55 to 60%, mild to moderate AI, moderate MR, severe atrial enlargement, severe TR.  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  Known paroxysmal atrial fibrillation s/p spontaneous conversion to NSR on diltiazem and flecainide.  Myoview 01/2017 poor quality but low risk with EF 72%.  Echo 07/2018 EF 55 to 60%, grade 1 diastolic dysfunction, mildly dilated ascending aorta with moderate MVP with MR and moderate to severe dilated LA, moderate TR, moderate pulmonary hypertension.  Evaluated Dr. Excell Seltzer 06/05/2021 deemed best for medical management given relative lack of symptoms and comorbidities. 05/2022 LVEF 60 to 65%, severe RV enlargement with preserved RV function, severe MR with mild to moderate AR and severe aortic dilation 50 mm, severe TR.  Not surgical candidate due to severe scoliosis.   Admitted 11/2022 for acute on chronic diastolic heart failure exacerbation and atrial fibrillation with RVR.  Losartan discontinued due to soft blood pressure.  She was diuresed and placed on p.o. amiodarone in addition to apixaban, metoprolol, diltiazem.  Previously on torsemide but felt Lasix worked better and was transition back to Lasix 01/23/2023.    Seen 04/09/23 with Lasix reduced to 20 mg 3 times per week for protection of renal function and to avoid hypotension. She saw Dr. Myra Gianotti of [facility] 04/23/2023 and was offered laser  ablation of the left saphenous vein. Seen 06/04/23 and politely declined referral to outpatient PT offered due to persistent leg weakness despite completion of HH PT. She was taking Lasix daily with stable renal function and it was continued.   Presents today for follow up. Feeling similar to previous. Predominantly sedentary during the day but is doing her previously recommended PT exercises. LE edema and venous stasis color change slightly improved with bilateral LE nonpitting edema. Exertional dyspnea stable at baseline. No chest pain, orthopnea, PND.  Has annual visit with PCP upcoming next month. BP at home mostly 110s-130s/60-70s with isolated SBP 177 which she attributes to increased salt the day prior. Eats mostly at home but often easy to prepare meals. Does have an aide who brings her to visits and does her grocery shopping.   ROS: Please see the history of present illness.    All other systems reviewed and are negative.   Studies Reviewed: .        Cardiac Studies & Procedures     STRESS TESTS  MYOCARDIAL PERFUSION IMAGING 01/29/2017  Narrative  The nuclear study is very poor quality due to body habitus  Defect 1: There is a defect present in the basal inferolateral and mid inferolateral location that is most likely artifact due to uptake in structures below the diaphragm.  This is a low risk study.  Nuclear stress EF: 72%. The left ventricular ejection fraction is hyperdynamic (>65%).   ECHOCARDIOGRAM  ECHOCARDIOGRAM COMPLETE 06/05/2022  Narrative ECHOCARDIOGRAM REPORT    Patient Name:   [name]  [name] Date of Exam: 06/05/2022 Medical Rec #:  [id number]          Height:       59.0 in Accession #:    [id number]         Weight:       98.8 lb Date of Birth:  04-Dec-1935          BSA:          1.367 m Patient Age:    86 years           BP:           130/90 mmHg Patient Gender: F                  HR:           74 bpm. Exam Location:  [address]  Procedure: 2D Echo,  Cardiac Doppler and Color Doppler  Indications:    I34.0 Mitral Valve Insufficiency  History:        Patient has prior history of Echocardiogram examinations, most recent 11/17/2021. CHF, Mitral Valve Prolapse, Mitral Valve Disease, Aortic Valve Disease and Tricuspid Insufficiency, Arrythmias:Atrial Fibrillation; Risk Factors:Hypertension, Dyslipidemia and Family History of Coronary Artery Disease. Ascending Aortic Aneurysm, Right Breast Cancer status post Partial Mastectomy and Radiation.  Sonographer:    Farrel Conners RDCS Referring Phys: Kathlynn Grate [place]  IMPRESSIONS   1. Left ventricular ejection fraction, by estimation, is 60 to 65%. The left ventricle has normal function. The left ventricle has no regional wall motion abnormalities. There is severe asymmetric left ventricular hypertrophy of the basal-septal segment. Left ventricular diastolic parameters are indeterminate. 2. There is systolic anterior motion of the mitral valve but most of MR is related to atrial dilation and bi-leaflet prolapse. 3. Right ventricular systolic function is normal. The right ventricular size is severely enlarged. There is severely elevated pulmonary artery systolic pressure. The estimated right ventricular systolic pressure is 74.9 mmHg. 4. Left atrial size was severely dilated. 5. Right atrial size was severely dilated. 6. The mitral valve is abnormal. Severe mitral valve regurgitation. 7. Tricuspid valve regurgitation is severe. 8. The aortic valve is tricuspid. There is mild calcification of the aortic valve. Aortic valve regurgitation is mild to moderate. No aortic stenosis is present. 9. Aortic dilatation noted. There is severe dilatation of the ascending aorta, measuring 50 mm. There is moderate dilatation of the aortic arch, measuring 45 mm.  Comparison(s): Further aortic dilation, worsening tricuspid and mitral regurgitation.  FINDINGS Left Ventricle: Left ventricular ejection fraction,  by estimation, is 60 to 65%. The left ventricle has normal function. The left ventricle has no regional wall motion abnormalities. The left ventricular internal cavity size was normal in size. There is severe asymmetric left ventricular hypertrophy of the basal-septal segment. Left ventricular diastolic function could not be evaluated due to mitral regurgitation (moderate or greater). Left ventricular diastolic parameters are indeterminate.  Right Ventricle: The right ventricular size is severely enlarged. No increase in right ventricular wall thickness. Right ventricular systolic function is normal. There is severely elevated pulmonary artery systolic pressure. The tricuspid regurgitant velocity is 3.87 m/s, and with an assumed right atrial pressure of 15 mmHg, the estimated right ventricular systolic pressure is 74.9 mmHg.  Left Atrium: Left atrial size was severely dilated.  Right Atrium: Right atrial size was severely dilated.  Pericardium: There is no evidence of pericardial effusion.  Mitral Valve: The mitral valve is abnormal. Severe mitral valve regurgitation.  Tricuspid Valve: The tricuspid valve is  normal in structure. Tricuspid valve regurgitation is severe. No evidence of tricuspid stenosis.  Aortic Valve: The aortic valve is tricuspid. There is mild calcification of the aortic valve. Aortic valve regurgitation is mild to moderate. Aortic regurgitation PHT measures 559 msec. No aortic stenosis is present.  Pulmonic Valve: The pulmonic valve was normal in structure. Pulmonic valve regurgitation is mild. No evidence of pulmonic stenosis.  Aorta: Aortic dilatation noted. There is severe dilatation of the ascending aorta, measuring 50 mm. There is moderate dilatation of the aortic arch, measuring 45 mm.  IAS/Shunts: No atrial level shunt detected by color flow Doppler.   LEFT VENTRICLE PLAX 2D LVIDd:         5.10 cm   Diastology LVIDs:         2.90 cm   LV e' medial:    5.44  cm/s LV PW:         1.00 cm   LV E/e' medial:  14.8 LV IVS:        1.20 cm   LV e' lateral:   7.77 cm/s LVOT diam:     2.00 cm   LV E/e' lateral: 10.3 LV SV:         78 LV SV Index:   57 LVOT Area:     3.14 cm   RIGHT VENTRICLE RV Basal diam:  4.80 cm RV Mid diam:    3.30 cm RV S prime:     29.20 cm/s TAPSE (M-mode): 3.3 cm  LEFT ATRIUM              Index         RIGHT ATRIUM           Index LA diam:        4.50 cm  3.29 cm/m    RA Area:     35.60 cm LA Vol (A2C):   119.0 ml 87.07 ml/m   RA Volume:   122.00 ml 89.27 ml/m LA Vol (A4C):   144.0 ml 105.36 ml/m LA Biplane Vol: 132.0 ml 96.58 ml/m AORTIC VALVE LVOT Vmax:   147.80 cm/s LVOT Vmean:  91.060 cm/s LVOT VTI:    0.250 m AI PHT:      559 msec  AORTA Ao Root diam: 4.40 cm Ao Asc diam:  4.95 cm  MITRAL VALVE                 TRICUSPID VALVE MV Area (PHT)  cm           TR Peak grad:   59.9 mmHg MV Decel Time: 237 msec      TR Vmax:        387.00 cm/s MR Peak grad:   129.0 mmHg MR Mean grad:   86.0 mmHg    SHUNTS MR Vmax:        568.00 cm/s  Systemic VTI:  0.25 m MR Vmean:       442.5 cm/s   Systemic Diam: 2.00 cm MR PISA:        9.05 cm MR PISA Radius: 1.20 cm MV E velocity: 80.40 cm/s MV A velocity: 117.50 cm/s MV E/A ratio:  0.68  Riley Lam MD Electronically signed by Riley Lam MD Signature Date/Time: 06/05/2022/11:21:15 AM    Final   TEE  ECHO TEE 12/04/2016  Narrative *[place]* *[facility]* [address] [place], [place] [address] [phone]  ------------------------------------------------------------------- Transesophageal Echocardiography  Patient:    Tanera, Bauknight MR #:  [id number] Study Date: 12/04/2016 Gender:     F Age:        38 Height:     149.9 cm Weight:     48.5 kg BSA:        1.43 m^2 Pt. Status: Room:       2W15C  ADMITTING    Maree Krabbe ORDERING     Hillis Range, MD REFERRING    Hillis Range,  MD PERFORMING   Zoila Shutter MD ATTENDING    Alison Murray SONOGRAPHER  Lysbeth Galas, RDCS  cc:  ------------------------------------------------------------------- LV EF: 60% -   65%  ------------------------------------------------------------------- Indications:      Mitral regurgitation 424.0.  ------------------------------------------------------------------- History:   PMH:   Dyspnea.  Risk factors:  Hypertension.  ------------------------------------------------------------------- Study Conclusions  - Left ventricle: There was mild concentric hypertrophy. Systolic function was normal. The estimated ejection fraction was in the range of 60% to 65%. Wall motion was normal; there were no regional wall motion abnormalities. - Aortic valve: Sclerosis with mild AI. - Aorta: Dilated. Ascending aortic diameter: 45 mm (S). - Mitral valve: Partially flail P2 segment with moderate, posteriorly directed regurgitation. Effective regurgitant orifice (PISA): 0.33 cm^2. Regurgitant volume (PISA): 40 ml. - Left atrium: Moderately dilated. No evidence of thrombus in the atrial cavity or appendage. - Right atrium: No evidence of thrombus in the atrial cavity or appendage. - Atrial septum: No defect or patent foramen ovale was identified. - Pulmonic valve: No evidence of vegetation.  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 LVEF 60-65%, moderate LAE, no LAA thrombus.  ------------------------------------------------------------------- Study data:   Study status:  Routine.  Consent:  The risks, benefits, and alternatives to the procedure were explained to the patient and informed consent was obtained.  Procedure:  The patient reported no pain pre or post test. Initial setup. The patient was brought to the laboratory. Surface ECG leads were monitored. Sedation. Conscious sedation  was administered by cardiology staff. Transesophageal echocardiography. An adult multiplane transesophageal probe was inserted by the attending cardiologistwithout difficulty. Image quality was adequate. Intravenous contrast (agitated saline) was administered.  Study completion:  The patient tolerated the procedure well. There were no complications.  Administered medications:   Midazolam, 4mg , IV. Fentanyl, , IV.          Diagnostic transesophageal echocardiography.  2D and color Doppler.  Birthdate:  Patient birthdate: October 02, 1936.  Age:  Patient is 87 yr old.  Sex:  Gender: female.    BMI: 21.6 kg/m^2.  Blood pressure:     136/88  Patient status:  Inpatient.  Study date:  Study date: 12/04/2016. Study time: 07:59 AM.  Location:  Endoscopy.  -------------------------------------------------------------------  ------------------------------------------------------------------- Left ventricle:  There was mild concentric hypertrophy. Systolic function was normal. The estimated ejection fraction was in the range of 60% to 65%. Wall motion was normal; there were no regional wall motion abnormalities.  ------------------------------------------------------------------- Aortic valve:  Sclerosis with mild AI.  ------------------------------------------------------------------- Aorta:  Dilated.  ------------------------------------------------------------------- Mitral valve:  Partially flail P2 segment with moderate, posteriorly directed regurgitation.  ------------------------------------------------------------------- Left atrium:  Moderately dilated.  No evidence of thrombus in the atrial cavity or appendage.  ------------------------------------------------------------------- Atrial septum:  No defect or patent foramen ovale was identified.  ------------------------------------------------------------------- Pulmonary veins:  no  anomaly.  ------------------------------------------------------------------- Pulmonic valve:    Structurally normal valve.   Cusp separation was normal.  No evidence of vegetation.  -------------------------------------------------------------------  Tricuspid valve:   Doppler:  There was mild regurgitation.  ------------------------------------------------------------------- Pulmonary artery:   The main pulmonary artery was normal-sized.  ------------------------------------------------------------------- Right atrium:  The atrium was normal in size.  No evidence of thrombus in the atrial cavity or appendage.  ------------------------------------------------------------------- Pericardium:  There was no pericardial effusion.  ------------------------------------------------------------------- Post procedure conclusions Ascending Aorta:  - Dilated.  ------------------------------------------------------------------- Measurements  Aorta                                Value Ascending aorta ID, A-P, S           45    mm  Mitral valve                         Value Mitral regurg VTI, PISA              121   cm Mitral ERO, PISA                     0.33  cm^2 Mitral regurg volume, PISA           40    ml  Tricuspid valve                      Value Tricuspid regurg peak velocity       226   cm/s Tricuspid peak RV-RA gradient        20    mm Hg  Legend: (L)  and  (H)  mark values outside specified reference range.  ------------------------------------------------------------------- Prepared and Electronically Authenticated by  Zoila Shutter MD 2018-01-24T16:46:38            Risk Assessment/Calculations:    CHA2DS2-VASc Score = 6   This indicates a 9.7% annual risk of stroke. The patient's score is based upon: CHF History: 1 HTN History: 1 Diabetes History: 0 Stroke History: 0 Vascular Disease History: 1 Age Score: 2 Gender Score: 1            Physical Exam:    VS:  BP 120/70   Pulse 62   Resp 16   Ht 4\' 11"  (1.499 m)   Wt 94 lb 12.8 oz (43 kg)   SpO2 98%   BMI 19.15 kg/m    Wt Readings from Last 3 Encounters:  09/03/23 94 lb 12.8 oz (43 kg)  06/04/23 99 lb 4.8 oz (45 kg)  05/13/23 99 lb (44.9 kg)    GEN: Well nourished, well developed in no acute distress NECK: No JVD; No carotid bruits CARDIAC: RRR, no rubs, gallops.  Gr 3/6 systolic murmur throughout RESPIRATORY:  Clear to auscultation without rales, wheezing or rhonchi  ABDOMEN: Soft, non-tender, non-distended EXTREMITIES:  Nonpitting bilateral LE edema; bilateral LE with venous stasis color changes; No deformity   ASSESSMENT AND PLAN: .    Diastolic heart failure/ LE edema / Severe MR -echo 06/05/2022 normal LVEF, MVP, severe MR/TR, severely elevated PASP.  Not surgical candidate.  Well compensated on exam with only non pitting LE edema. Continue lasix 20mg  daily.  Reiterated that due to prior LLE DVT, venous insufficiency her LE edema is multifactorial and will not resolve with further increased diuresis. Hesitant to further increase Lasix due to relative hypotension at home with SBP often 110s. Given paper Rx for air compression system or compression wraps, whichever she feels she will tolerate better as reports  difficulty with compression socks.  Future considerations include Marcelline Deist however will defer due to relative hypotension at home.  Low sodium diet, fluid restriction <2L, and daily weights encouraged. Educated to contact our office for weight gain of 2 lbs overnight or 5 lbs in one week.    Fatigue - CBC, thyroid panel previously unremarkable. Longstanding issue likely related to deconditioning. Has previously declined outpatient physical therapy referral.   PAF/on amiodarone therapy/hypercoagulable state - Prior bradycardia resolved after reduced dose diltiazem and discontinuation of metoprolol.  Continue amiodarone 200mg  every day. CHA2DS2-VASc Score = 6 [CHF History: 1, HTN  History: 1, Diabetes History: 0, Stroke History: 0, Vascular Disease History: 1, Age Score: 2, Gender Score: 1].  Therefore, the patient's annual risk of stroke is 9.7 %.    Continue Eliquis 2.5 mg twice daily.  Reduced dose due to age, renal function. Amiodarone monitoring: 03/2023 stable thyroid. Given hypothyroidism, management per PCP.  12/2022 AST 24 ALT 21.  No evidence of toxicity. Has labs upcoming with PCP 09/2023 for annual visit.    Aortic atherosclerosis / HLD - Continue Pravastatin. Stable with no anginal symptoms. No indication for ischemic evaluation.     Venous reflux - Has been offered saphenous vein ablation with [facility]. Venous insufficiency contributory to LE edema, continue compression stockings, leg elevation.   Hypothyroidism -Continue to follow with PCP. Stable on present dose levothyroxine 03/2023.    GERD - Continue Omeprazole.Continue to follow with PCP.        Dispo: follow up in 5 months with Dr. Cristal Deer (to establish care since Dr. Devin Going departure)  Signed, Alver Sorrow, NP

## 2023-09-09 DIAGNOSIS — R310 Gross hematuria: Secondary | ICD-10-CM | POA: Diagnosis not present

## 2023-09-09 DIAGNOSIS — N3281 Overactive bladder: Secondary | ICD-10-CM | POA: Diagnosis not present

## 2023-09-25 DIAGNOSIS — I872 Venous insufficiency (chronic) (peripheral): Secondary | ICD-10-CM | POA: Diagnosis not present

## 2023-09-25 DIAGNOSIS — I10 Essential (primary) hypertension: Secondary | ICD-10-CM | POA: Diagnosis not present

## 2023-09-25 DIAGNOSIS — I509 Heart failure, unspecified: Secondary | ICD-10-CM | POA: Diagnosis not present

## 2023-09-25 DIAGNOSIS — N1831 Chronic kidney disease, stage 3a: Secondary | ICD-10-CM | POA: Diagnosis not present

## 2023-09-25 DIAGNOSIS — Z23 Encounter for immunization: Secondary | ICD-10-CM | POA: Diagnosis not present

## 2023-09-25 DIAGNOSIS — I712 Thoracic aortic aneurysm, without rupture, unspecified: Secondary | ICD-10-CM | POA: Diagnosis not present

## 2023-09-25 DIAGNOSIS — Z86718 Personal history of other venous thrombosis and embolism: Secondary | ICD-10-CM | POA: Diagnosis not present

## 2023-09-25 DIAGNOSIS — E782 Mixed hyperlipidemia: Secondary | ICD-10-CM | POA: Diagnosis not present

## 2023-09-25 DIAGNOSIS — Z1331 Encounter for screening for depression: Secondary | ICD-10-CM | POA: Diagnosis not present

## 2023-09-25 DIAGNOSIS — E039 Hypothyroidism, unspecified: Secondary | ICD-10-CM | POA: Diagnosis not present

## 2023-09-25 DIAGNOSIS — I4821 Permanent atrial fibrillation: Secondary | ICD-10-CM | POA: Diagnosis not present

## 2023-09-25 DIAGNOSIS — Z853 Personal history of malignant neoplasm of breast: Secondary | ICD-10-CM | POA: Diagnosis not present

## 2023-09-25 DIAGNOSIS — I700 Atherosclerosis of aorta: Secondary | ICD-10-CM | POA: Diagnosis not present

## 2023-10-01 DIAGNOSIS — R310 Gross hematuria: Secondary | ICD-10-CM | POA: Diagnosis not present

## 2023-10-22 ENCOUNTER — Encounter: Payer: Self-pay | Admitting: Podiatry

## 2023-10-22 ENCOUNTER — Ambulatory Visit (INDEPENDENT_AMBULATORY_CARE_PROVIDER_SITE_OTHER): Payer: Medicare Other | Admitting: Podiatry

## 2023-10-22 DIAGNOSIS — D689 Coagulation defect, unspecified: Secondary | ICD-10-CM

## 2023-10-22 DIAGNOSIS — M79674 Pain in right toe(s): Secondary | ICD-10-CM | POA: Diagnosis not present

## 2023-10-22 DIAGNOSIS — M79675 Pain in left toe(s): Secondary | ICD-10-CM | POA: Diagnosis not present

## 2023-10-22 DIAGNOSIS — B351 Tinea unguium: Secondary | ICD-10-CM

## 2023-10-22 NOTE — Progress Notes (Signed)
  Subjective:  Patient ID: [name], female    DOB: 1936/10/28,  MRN: [id number]  Chief Complaint  Patient presents with   Nail Problem    rfc    87 y.o. female presents for concern of thickened elongated and painful nails that are difficult to trim. Requesting to have them trimmed today. Patient has history of blood thinner use.   PCP:  Georgann Housekeeper, MD      Objective:  Physical Exam: warm, good capillary refill, no trophic changes or ulcerative lesions, normal DP and PT pulses, and normal sensory exam. Left Foot: dystrophic yellowed discolored nail plates with subungual debris Right Foot: dystrophic yellowed discolored nail plates with subungual debris  Assessment:   No diagnosis found.     Plan:  Patient was evaluated and treated and all questions answered.  -Mechanically debrided all nails 1-5 bilateral using sterile nail nipper and filed with dremel without incident  -Answered all patient questions -Patient to return  in 3 months for at risk foot care -Patient advised to call the office if any problems or questions arise in the meantime.    No follow-ups on file.

## 2023-10-23 ENCOUNTER — Ambulatory Visit: Payer: Medicare Other | Admitting: Podiatry

## 2023-10-24 DIAGNOSIS — R945 Abnormal results of liver function studies: Secondary | ICD-10-CM | POA: Diagnosis not present

## 2023-10-29 DIAGNOSIS — N765 Ulceration of vagina: Secondary | ICD-10-CM | POA: Diagnosis not present

## 2023-10-29 DIAGNOSIS — N814 Uterovaginal prolapse, unspecified: Secondary | ICD-10-CM | POA: Diagnosis not present

## 2023-10-29 DIAGNOSIS — Z4689 Encounter for fitting and adjustment of other specified devices: Secondary | ICD-10-CM | POA: Diagnosis not present

## 2023-12-20 ENCOUNTER — Emergency Department (HOSPITAL_COMMUNITY): Payer: Medicare PPO

## 2023-12-20 ENCOUNTER — Inpatient Hospital Stay (HOSPITAL_COMMUNITY)
Admission: EM | Admit: 2023-12-20 | Discharge: 2023-12-26 | DRG: 480 | Disposition: A | Discharge status: Skilled Nursing Facility | Payer: Medicare PPO | Attending: Internal Medicine | Admitting: Internal Medicine

## 2023-12-20 DIAGNOSIS — Z981 Arthrodesis status: Secondary | ICD-10-CM

## 2023-12-20 DIAGNOSIS — Z681 Body mass index (BMI) 19 or less, adult: Secondary | ICD-10-CM

## 2023-12-20 DIAGNOSIS — Z8249 Family history of ischemic heart disease and other diseases of the circulatory system: Secondary | ICD-10-CM

## 2023-12-20 DIAGNOSIS — E871 Hypo-osmolality and hyponatremia: Secondary | ICD-10-CM | POA: Diagnosis not present

## 2023-12-20 DIAGNOSIS — Z9011 Acquired absence of right breast and nipple: Secondary | ICD-10-CM

## 2023-12-20 DIAGNOSIS — I5032 Chronic diastolic (congestive) heart failure: Secondary | ICD-10-CM | POA: Diagnosis present

## 2023-12-20 DIAGNOSIS — Z86718 Personal history of other venous thrombosis and embolism: Secondary | ICD-10-CM

## 2023-12-20 DIAGNOSIS — Y92009 Unspecified place in unspecified non-institutional (private) residence as the place of occurrence of the external cause: Secondary | ICD-10-CM

## 2023-12-20 DIAGNOSIS — Z923 Personal history of irradiation: Secondary | ICD-10-CM

## 2023-12-20 DIAGNOSIS — Z888 Allergy status to other drugs, medicaments and biological substances status: Secondary | ICD-10-CM

## 2023-12-20 DIAGNOSIS — Z9849 Cataract extraction status, unspecified eye: Secondary | ICD-10-CM

## 2023-12-20 DIAGNOSIS — Z66 Do not resuscitate: Secondary | ICD-10-CM | POA: Diagnosis present

## 2023-12-20 DIAGNOSIS — D6832 Hemorrhagic disorder due to extrinsic circulating anticoagulants: Secondary | ICD-10-CM | POA: Diagnosis present

## 2023-12-20 DIAGNOSIS — R7401 Elevation of levels of liver transaminase levels: Secondary | ICD-10-CM | POA: Diagnosis present

## 2023-12-20 DIAGNOSIS — R0902 Hypoxemia: Secondary | ICD-10-CM | POA: Diagnosis present

## 2023-12-20 DIAGNOSIS — T45515A Adverse effect of anticoagulants, initial encounter: Secondary | ICD-10-CM | POA: Diagnosis present

## 2023-12-20 DIAGNOSIS — R64 Cachexia: Secondary | ICD-10-CM | POA: Diagnosis present

## 2023-12-20 DIAGNOSIS — S8011XA Contusion of right lower leg, initial encounter: Secondary | ICD-10-CM | POA: Diagnosis present

## 2023-12-20 DIAGNOSIS — Z91041 Radiographic dye allergy status: Secondary | ICD-10-CM

## 2023-12-20 DIAGNOSIS — D72829 Elevated white blood cell count, unspecified: Secondary | ICD-10-CM | POA: Diagnosis present

## 2023-12-20 DIAGNOSIS — E7800 Pure hypercholesterolemia, unspecified: Secondary | ICD-10-CM | POA: Diagnosis present

## 2023-12-20 DIAGNOSIS — D696 Thrombocytopenia, unspecified: Secondary | ICD-10-CM | POA: Diagnosis not present

## 2023-12-20 DIAGNOSIS — R54 Age-related physical debility: Secondary | ICD-10-CM | POA: Diagnosis present

## 2023-12-20 DIAGNOSIS — Z86000 Personal history of in-situ neoplasm of breast: Secondary | ICD-10-CM

## 2023-12-20 DIAGNOSIS — S72002A Fracture of unspecified part of neck of left femur, initial encounter for closed fracture: Secondary | ICD-10-CM | POA: Diagnosis not present

## 2023-12-20 DIAGNOSIS — M545 Low back pain, unspecified: Secondary | ICD-10-CM | POA: Diagnosis not present

## 2023-12-20 DIAGNOSIS — I480 Paroxysmal atrial fibrillation: Secondary | ICD-10-CM | POA: Diagnosis present

## 2023-12-20 DIAGNOSIS — E43 Unspecified severe protein-calorie malnutrition: Secondary | ICD-10-CM | POA: Diagnosis present

## 2023-12-20 DIAGNOSIS — I959 Hypotension, unspecified: Secondary | ICD-10-CM | POA: Diagnosis present

## 2023-12-20 DIAGNOSIS — M4854XA Collapsed vertebra, not elsewhere classified, thoracic region, initial encounter for fracture: Secondary | ICD-10-CM | POA: Diagnosis present

## 2023-12-20 DIAGNOSIS — M797 Fibromyalgia: Secondary | ICD-10-CM | POA: Diagnosis present

## 2023-12-20 DIAGNOSIS — Z79890 Hormone replacement therapy (postmenopausal): Secondary | ICD-10-CM

## 2023-12-20 DIAGNOSIS — F419 Anxiety disorder, unspecified: Secondary | ICD-10-CM | POA: Diagnosis present

## 2023-12-20 DIAGNOSIS — E039 Hypothyroidism, unspecified: Secondary | ICD-10-CM | POA: Diagnosis present

## 2023-12-20 DIAGNOSIS — E869 Volume depletion, unspecified: Secondary | ICD-10-CM | POA: Diagnosis present

## 2023-12-20 DIAGNOSIS — M40204 Unspecified kyphosis, thoracic region: Secondary | ICD-10-CM | POA: Diagnosis present

## 2023-12-20 DIAGNOSIS — I110 Hypertensive heart disease with heart failure: Secondary | ICD-10-CM | POA: Diagnosis present

## 2023-12-20 DIAGNOSIS — E876 Hypokalemia: Secondary | ICD-10-CM | POA: Diagnosis present

## 2023-12-20 DIAGNOSIS — K5900 Constipation, unspecified: Secondary | ICD-10-CM | POA: Diagnosis not present

## 2023-12-20 DIAGNOSIS — I10 Essential (primary) hypertension: Secondary | ICD-10-CM | POA: Diagnosis present

## 2023-12-20 DIAGNOSIS — Z880 Allergy status to penicillin: Secondary | ICD-10-CM

## 2023-12-20 DIAGNOSIS — I272 Pulmonary hypertension, unspecified: Secondary | ICD-10-CM | POA: Diagnosis present

## 2023-12-20 DIAGNOSIS — D62 Acute posthemorrhagic anemia: Secondary | ICD-10-CM | POA: Diagnosis not present

## 2023-12-20 DIAGNOSIS — W010XXA Fall on same level from slipping, tripping and stumbling without subsequent striking against object, initial encounter: Secondary | ICD-10-CM | POA: Diagnosis present

## 2023-12-20 DIAGNOSIS — I7121 Aneurysm of the ascending aorta, without rupture: Secondary | ICD-10-CM | POA: Diagnosis present

## 2023-12-20 DIAGNOSIS — Z7901 Long term (current) use of anticoagulants: Secondary | ICD-10-CM

## 2023-12-20 DIAGNOSIS — Z7982 Long term (current) use of aspirin: Secondary | ICD-10-CM

## 2023-12-20 DIAGNOSIS — R0789 Other chest pain: Secondary | ICD-10-CM | POA: Diagnosis not present

## 2023-12-20 DIAGNOSIS — Z79899 Other long term (current) drug therapy: Secondary | ICD-10-CM

## 2023-12-20 DIAGNOSIS — K219 Gastro-esophageal reflux disease without esophagitis: Secondary | ICD-10-CM | POA: Diagnosis present

## 2023-12-20 LAB — COMPREHENSIVE METABOLIC PANEL
ALT: 56 U/L — ABNORMAL HIGH (ref 0–44)
AST: 45 U/L — ABNORMAL HIGH (ref 15–41)
Albumin: 3.4 g/dL — ABNORMAL LOW (ref 3.5–5.0)
Alkaline Phosphatase: 47 U/L (ref 38–126)
Anion gap: 15 (ref 5–15)
BUN: 26 mg/dL — ABNORMAL HIGH (ref 8–23)
CO2: 25 mmol/L (ref 22–32)
Calcium: 9.2 mg/dL (ref 8.9–10.3)
Chloride: 98 mmol/L (ref 98–111)
Creatinine, Ser: 0.97 mg/dL (ref 0.44–1.00)
GFR, Estimated: 57 mL/min — ABNORMAL LOW (ref >60)
Glucose, Bld: 141 mg/dL — ABNORMAL HIGH (ref 70–99)
Potassium: 3.3 mmol/L — ABNORMAL LOW (ref 3.5–5.1)
Sodium: 138 mmol/L (ref 135–145)
Total Bilirubin: 0.9 mg/dL (ref 0.0–1.2)
Total Protein: 6.1 g/dL — ABNORMAL LOW (ref 6.5–8.1)

## 2023-12-20 LAB — CBC WITH DIFFERENTIAL/PLATELET
Abs Immature Granulocytes: 0.12 10*3/uL — ABNORMAL HIGH (ref 0.00–0.07)
Basophils Absolute: 0 10*3/uL (ref 0.0–0.1)
Basophils Relative: 0 %
Eosinophils Absolute: 0 10*3/uL (ref 0.0–0.5)
Eosinophils Relative: 0 %
HCT: 37.9 % (ref 36.0–46.0)
Hemoglobin: 13.3 g/dL (ref 12.0–15.0)
Immature Granulocytes: 1 %
Lymphocytes Relative: 3 %
Lymphs Abs: 0.6 10*3/uL — ABNORMAL LOW (ref 0.7–4.0)
MCH: 31.4 pg (ref 26.0–34.0)
MCHC: 35.1 g/dL (ref 30.0–36.0)
MCV: 89.4 fL (ref 80.0–100.0)
Monocytes Absolute: 2.1 10*3/uL — ABNORMAL HIGH (ref 0.1–1.0)
Monocytes Relative: 10 %
Neutro Abs: 17.2 10*3/uL — ABNORMAL HIGH (ref 1.7–7.7)
Neutrophils Relative %: 86 %
Platelets: 170 10*3/uL (ref 150–400)
RBC: 4.24 MIL/uL (ref 3.87–5.11)
RDW: 14.3 % (ref 11.5–15.5)
WBC: 20 10*3/uL — ABNORMAL HIGH (ref 4.0–10.5)
nRBC: 0 % (ref 0.0–0.2)

## 2023-12-20 MED ORDER — SODIUM CHLORIDE 0.9 % IV BOLUS: 1000.0000 mL | Freq: Once | INTRAVENOUS | Status: DC

## 2023-12-20 MED ORDER — FENTANYL CITRATE PF 50 MCG/ML IJ SOSY
50.0000 ug | PREFILLED_SYRINGE | Freq: Once | INTRAMUSCULAR | Status: AC
  Administered 2023-12-20: 50 ug via INTRAVENOUS
  Filled 2023-12-20: qty 1

## 2023-12-20 MED ORDER — SODIUM CHLORIDE 0.9 % IV BOLUS
1000.0000 mL | Freq: Once | INTRAVENOUS | Status: AC
  Administered 2023-12-20: 1000 mL via INTRAVENOUS

## 2023-12-20 NOTE — ED Provider Notes (Signed)
 [place] EMERGENCY DEPARTMENT AT [facility] Provider Note   CSN: [id number] Arrival date & time: 12/20/23  1947     History  Chief Complaint  Patient presents with   Fall   Hip Pain    [name] is a 88 y.o. female.  The history is provided by the patient and medical records. No language interpreter was used.  Fall This is a new problem. The current episode started 1 to 2 hours ago. The problem occurs rarely. The problem has not changed since onset.Associated symptoms include shortness of breath. Pertinent negatives include no chest pain, no abdominal pain and no headaches. Nothing aggravates the symptoms. Nothing relieves the symptoms. She has tried nothing for the symptoms. The treatment provided no relief.  Hip Pain Associated symptoms include shortness of breath. Pertinent negatives include no chest pain, no abdominal pain and no headaches.       Home Medications Prior to Admission medications   Medication Sig Start Date End Date Taking? Authorizing Provider  amiodarone  (PACERONE ) 200 MG tablet Take 1 tablet (200 mg total) by mouth daily. 09/03/23   Vannie Reche RAMAN, NP  apixaban  (ELIQUIS ) 2.5 MG TABS tablet Take 1 tablet (2.5 mg total) by mouth 2 (two) times daily. 09/03/23   Walker, Caitlin S, NP  Ascorbic Acid (VITA-C PO) Take 1,000 mg by mouth daily.    [provider]  calcium  citrate-vitamin D  (CITRACAL+D) 315-200 MG-UNIT per tablet Take 1 tablet by mouth 2 (two) times daily. 01/28/13   Signa Rush, MD  diltiazem  (CARDIZEM  CD) 120 MG 24 hr capsule Take 1 capsule (120 mg total) by mouth daily. 03/08/23 06/06/23  Vannie Reche RAMAN, NP  estradiol (ESTRACE) 0.1 MG/GM vaginal cream Place 1 Applicatorful vaginally once a week.    [provider]  furosemide  (LASIX ) 20 MG tablet Take 20 mg by mouth daily.    [provider]  HYDROcodone -acetaminophen  (NORCO/VICODIN) 5-325 MG tablet Take 1-2 tablets by mouth every 6 (six) hours as  needed (mild pain). 12/03/22   Fairy Frames, MD  iron  polysaccharides (NIFEREX) 150 MG capsule Take 1 capsule (150 mg total) by mouth 3 (three) times a week. M, W, F 12/03/22   Fairy Frames, MD  levothyroxine  (SYNTHROID ) 125 MCG tablet Take 1 tablet (125 mcg total) by mouth daily before breakfast. 01/02/23   Hobart Powell BRAVO, MD  Meclizine  HCl (BONINE PO) Take by mouth as needed.    [provider]  Multiple Vitamin (MULTIVITAMIN) tablet Take 1 tablet by mouth daily with breakfast.    [provider]  omeprazole  (PRILOSEC ) 20 MG capsule Take 1 capsule (20 mg total) by mouth daily. 02/15/23   Wyn Jackee VEAR Mickey., NP  OVER THE COUNTER MEDICATION Take 1,000 mg by mouth daily. D-Mannose 500 mg    [provider]  pravastatin  (PRAVACHOL ) 20 MG tablet Take 1 tablet (20 mg total) by mouth daily with supper. 12/03/22   Fairy Frames, MD  risedronate  (ACTONEL ) 150 MG tablet Take 150 mg by mouth every 30 (thirty) days. with water on empty stomach, nothing by mouth or lie down for next 30 minutes.    [provider]  senna-docusate (SENOKOT-S) 8.6-50 MG tablet Take 1 tablet by mouth 2 (two) times daily. 12/03/22   Fairy Frames, MD  solifenacin  (VESICARE ) 10 MG tablet Take 1 tablet (10 mg total) by mouth daily. Patient taking differently: Take 5 mg by mouth daily. 12/03/22   Fairy Frames, MD  spironolactone  (ALDACTONE ) 25 MG  tablet Take 1/2 tablet (12.5 mg total) by mouth daily. 07/16/23   Lonni Slain, MD      Allergies    Contrast media [iodinated contrast media], Iohexol , Mucinex  [guaifenesin  er], Zanaflex [tizanidine hcl], Penicillins, and Robaxin  [methocarbamol ]    Review of Systems   Review of Systems  Constitutional:  Negative for chills, fatigue and fever.  HENT:  Negative for congestion.   Respiratory:  Positive for shortness of breath. Negative for cough, chest tightness and wheezing.   Cardiovascular:  Negative for chest pain and  palpitations.  Gastrointestinal:  Negative for abdominal pain, constipation, diarrhea, nausea and vomiting.  Genitourinary:  Negative for dysuria, flank pain and frequency.  Musculoskeletal:  Positive for back pain and neck pain. Negative for neck stiffness.  Skin:  Negative for rash and wound.  Neurological:  Negative for light-headedness and headaches.  Psychiatric/Behavioral:  Negative for agitation and confusion.   All other systems reviewed and are negative.   Physical Exam Updated Vital Signs BP 133/77 (BP Location: Left Arm)   Pulse 84   Temp 98 F (36.7 C) (Oral)   Resp 15   Ht 4' 11 (1.499 m)   Wt 42.2 kg   SpO2 100%   BMI 18.78 kg/m  Physical Exam Vitals and nursing note reviewed.  Constitutional:      General: She is not in acute distress.    Appearance: She is well-developed. She is not ill-appearing, toxic-appearing or diaphoretic.  HENT:     Head: Normocephalic and atraumatic.     Mouth/Throat:     Mouth: Mucous membranes are moist.     Pharynx: No oropharyngeal exudate or posterior oropharyngeal erythema.  Eyes:     Extraocular Movements: Extraocular movements intact.     Conjunctiva/sclera: Conjunctivae normal.     Pupils: Pupils are equal, round, and reactive to light.  Cardiovascular:     Rate and Rhythm: Normal rate and regular rhythm.     Heart sounds: No murmur heard. Pulmonary:     Effort: Pulmonary effort is normal. No respiratory distress.     Breath sounds: Normal breath sounds. No wheezing, rhonchi or rales.  Chest:     Chest wall: No tenderness.  Abdominal:     General: Abdomen is flat.     Palpations: Abdomen is soft.     Tenderness: There is no abdominal tenderness. There is no left CVA tenderness, guarding or rebound.  Musculoskeletal:        General: Tenderness and signs of injury present. No swelling.     Cervical back: Neck supple. Tenderness present.     Thoracic back: No tenderness.     Lumbar back: Tenderness present.        Legs:  Skin:    General: Skin is warm and dry.     Capillary Refill: Capillary refill takes less than 2 seconds.     Findings: Bruising present.  Neurological:     General: No focal deficit present.     Mental Status: She is alert.     Sensory: No sensory deficit.     Motor: No weakness.  Psychiatric:        Mood and Affect: Mood normal.     ED Results / Procedures / Treatments   Labs (all labs ordered are listed, but only abnormal results are displayed) Labs Reviewed  CBC WITH DIFFERENTIAL/PLATELET - Abnormal; Notable for the following components:      Result Value   WBC 20.0 (*)    Neutro Abs 17.2 (*)  Lymphs Abs 0.6 (*)    Monocytes Absolute 2.1 (*)    Abs Immature Granulocytes 0.12 (*)    All other components within normal limits  COMPREHENSIVE METABOLIC PANEL - Abnormal; Notable for the following components:   Potassium 3.3 (*)    Glucose, Bld 141 (*)    BUN 26 (*)    Total Protein 6.1 (*)    Albumin  3.4 (*)    AST 45 (*)    ALT 56 (*)    GFR, Estimated 57 (*)    All other components within normal limits  URINALYSIS, W/ REFLEX TO CULTURE (INFECTION SUSPECTED)    EKG EKG Interpretation Date/Time:  Friday December 20 2023 19:57:20 EST Ventricular Rate:  83 PR Interval:  238 QRS Duration:  126 QT Interval:  404 QTC Calculation: 475 R Axis:   257  Text Interpretation: Sinus rhythm Prolonged PR interval Probable left atrial enlargement IVCD, consider atypical RBBB Abnormal lateral Q waves Anterior infarct, old when compared to prior, more artifact than prior No STEMI Confirmed by Ginger Barefoot ([id number]) on 12/20/2023 8:04:10 PM  Radiology CT T-SPINE NO CHARGE Result Date: 12/20/2023 CLINICAL DATA:  fall EXAM: CT Thoracic and Lumbar spine without contrast TECHNIQUE: Multiplanar CT images of the thoracic and lumbar spine were reconstructed from contemporary CT of the Chest, Abdomen, and Pelvis. RADIATION DOSE REDUCTION: This exam was performed according to the  departmental dose-optimization program which includes automated exposure control, adjustment of the mA and/or kV according to patient size and/or use of iterative reconstruction technique. CONTRAST:  None or No additional COMPARISON:  None Available. FINDINGS: CT THORACIC SPINE FINDINGS Alignment: Normal. Vertebrae: Diffusely decreased bone density. C7-T1 anterior cervical discectomy and fusion surgical hardware. Interval worsening of T10 superior endplate compression fracture with 40% vertebral body height. T8, T9, T11, T12 chronic stable fractures. Kyphoplasty of T11. No acute fracture or focal pathologic process. Paraspinal and other soft tissues: Negative. Disc levels: Multilevel severe intervertebral disc space narrowing. CT LUMBAR SPINE FINDINGS Segmentation: 5 lumbar type vertebrae. Alignment: Normal. Vertebrae: Diffusely decreased bone density. Chronic stable L1, L2, L3, L4 fractures status post L1 and L4 kyphoplasty. No acute fracture or focal pathologic process. Paraspinal and other soft tissues: Negative. Disc levels: Multilevel intervertebral disc space vacuum phenomenon. IMPRESSION: 1. Interval worsening of T10 superior endplate compression fracture with 40% vertebral body height. Correlate with point tenderness to palpation for an acute component. 2. Multilevel chronic stable thoracolumbar fractures. 3. Diffusely decreased bone density. Electronically Signed   By: Morgane  Naveau M.D.   On: 12/20/2023 22:43   CT L-SPINE NO CHARGE Result Date: 12/20/2023 CLINICAL DATA:  fall EXAM: CT Thoracic and Lumbar spine without contrast TECHNIQUE: Multiplanar CT images of the thoracic and lumbar spine were reconstructed from contemporary CT of the Chest, Abdomen, and Pelvis. RADIATION DOSE REDUCTION: This exam was performed according to the departmental dose-optimization program which includes automated exposure control, adjustment of the mA and/or kV according to patient size and/or use of iterative  reconstruction technique. CONTRAST:  None or No additional COMPARISON:  None Available. FINDINGS: CT THORACIC SPINE FINDINGS Alignment: Normal. Vertebrae: Diffusely decreased bone density. C7-T1 anterior cervical discectomy and fusion surgical hardware. Interval worsening of T10 superior endplate compression fracture with 40% vertebral body height. T8, T9, T11, T12 chronic stable fractures. Kyphoplasty of T11. No acute fracture or focal pathologic process. Paraspinal and other soft tissues: Negative. Disc levels: Multilevel severe intervertebral disc space narrowing. CT LUMBAR SPINE FINDINGS Segmentation: 5 lumbar type vertebrae. Alignment: Normal. Vertebrae:  Diffusely decreased bone density. Chronic stable L1, L2, L3, L4 fractures status post L1 and L4 kyphoplasty. No acute fracture or focal pathologic process. Paraspinal and other soft tissues: Negative. Disc levels: Multilevel intervertebral disc space vacuum phenomenon. IMPRESSION: 1. Interval worsening of T10 superior endplate compression fracture with 40% vertebral body height. Correlate with point tenderness to palpation for an acute component. 2. Multilevel chronic stable thoracolumbar fractures. 3. Diffusely decreased bone density. Electronically Signed   By: Morgane  Naveau M.D.   On: 12/20/2023 22:43   CT CHEST ABDOMEN PELVIS WO CONTRAST Result Date: 12/20/2023 CLINICAL DATA:  Polytrauma, blunt fall, back pain, hlp pain. hit head. eliquis  use. EXAM: CT CHEST, ABDOMEN AND PELVIS WITHOUT CONTRAST TECHNIQUE: Multidetector CT imaging of the chest, abdomen and pelvis was performed following the standard protocol without IV contrast. RADIATION DOSE REDUCTION: This exam was performed according to the departmental dose-optimization program which includes automated exposure control, adjustment of the mA and/or kV according to patient size and/or use of iterative reconstruction technique. COMPARISON:  CT chest abdomen pelvis 11/22/2022, CT pelvis 06/08/2011, CT  abdomen pelvis 05/30/2017 FINDINGS: CHEST: Cardiovascular: Stable aneurysmal ascending thoracic aorta measuring up to 4.6 cm. The heart is normal in size. No significant pericardial effusion. Severe atherosclerotic plaque. Mitral annular calcification. Coronary artery calcification. Lungs/Pleura: Left lower lobe passive atelectasis. No focal consolidation. No pulmonary nodule. No pulmonary mass. No pulmonary contusion or laceration. No pneumatocele formation. No pleural effusion. No pneumothorax. No hemothorax. Mediastinum/Nodes: No pneumomediastinum. The central airways are patent. The esophagus is unremarkable. Stable large hiatal hernia containing the entire stomach. The thyroid  is unremarkable. Limited evaluation for hilar lymphadenopathy on this noncontrast study. No mediastinal or axillary lymphadenopathy. Musculoskeletal/Chest wall No chest wall mass. Diffusely decreased bone density. Old healed right rib fractures. No acute rib or sternal fracture. Please see separately dictated CT thoracolumbar spine 12/20/2023. ABDOMEN / PELVIS: Hepatobiliary: Not enlarged. Hyperdense hepatic parenchyma.  No focal lesion. The gallbladder is otherwise unremarkable with no radio-opaque gallstones. No biliary ductal dilatation. Pancreas: Normal pancreatic contour. No main pancreatic duct dilatation. Spleen: Not enlarged. No focal lesion. Adrenals/Urinary Tract: No nodularity bilaterally. No hydroureteronephrosis. No nephroureterolithiasis. No contour deforming renal mass. The urinary bladder is unremarkable. Stomach/Bowel: No small or large bowel wall thickening or dilatation. Stool throughout the majority of the colon. The appendix is unremarkable. Vasculature/Lymphatic: Moderate atherosclerotic plaque. No abdominal aorta or iliac aneurysm. No abdominal, pelvic, inguinal lymphadenopathy. Reproductive: Normal. Other: No simple free fluid ascites. No pneumoperitoneum. No mesenteric hematoma identified. No organized fluid  collection. Musculoskeletal: No significant soft tissue hematoma. Persistent densely sclerotic right iliac bone lesion. Diffusely decreased bone density. Acute impacted left femoral neck fracture. Please see separately dictated CT thoracolumbar spine 12/20/2023. Ports and Devices: None. IMPRESSION: 1. No acute intrathoracic, intra-abdominal, intrapelvic traumatic injury with limited evaluation on this noncontrast study. 2. Please see separately dictated CT thoracolumbar spine 12/20/2023. 3. Stable aneurysmal ascending thoracic aorta (4.6 cm). Ascending thoracic aortic aneurysm. Recommend semi-annual imaging followup by CTA or MRA and referral to cardiothoracic surgery if not already obtained. This recommendation follows 2010 ACCF/AHA/AATS/ACR/ASA/SCA/SCAI/SIR/STS/SVM Guidelines for the Diagnosis and Management of Patients With Thoracic Aortic Disease. Circulation. 2010; 121: [id number]. Aortic aneurysm NOS (ICD10-I71.9). Aortic aneurysm NOS (ICD10-I71.9). 4. Aortic Atherosclerosis (ICD10-I70.0) including coronary artery and mitral annular calcification. 5. Acute impacted left femoral neck fracture. 6. Constipation. Electronically Signed   By: Morgane  Naveau M.D.   On: 12/20/2023 22:29   DG Ankle Complete Right Result Date: 12/20/2023 CLINICAL DATA:  Status post fall.  EXAM: RIGHT ANKLE - COMPLETE 3+ VIEW COMPARISON:  None Available. FINDINGS: There is no evidence of fracture, dislocation, or joint effusion. There is no evidence of arthropathy or other focal bone abnormality. Moderate severity soft tissue swelling is seen along the medial aspect of the distal right tibial shaft. IMPRESSION: Medial soft tissue swelling without evidence of acute fracture or dislocation. Electronically Signed   By: Suzen Dials M.D.   On: 12/20/2023 22:21   DG Tibia/Fibula Right Result Date: 12/20/2023 CLINICAL DATA:  Status post fall. EXAM: RIGHT TIBIA AND FIBULA - 2 VIEW COMPARISON:  None Available. FINDINGS: There is no  evidence of fracture or other focal bone lesions. Mild to moderate severity focal soft tissue swelling is seen along the medial aspect of the distal right tibial shaft. IMPRESSION: Focal soft tissue swelling along the medial aspect of the distal right tibial shaft. Electronically Signed   By: Suzen Dials M.D.   On: 12/20/2023 22:19   DG Hip [place] or [place] Pelvis 1 View Left Result Date: 12/20/2023 CLINICAL DATA:  Status post fall. EXAM: DG HIP (WITH OR WITHOUT PELVIS) 1V PORT LEFT COMPARISON:  None Available. FINDINGS: There is no evidence of an acute hip fracture or dislocation. Mild degenerative changes are seen in the form of joint space narrowing and acetabular sclerosis. IMPRESSION: Mild degenerative changes without evidence of an acute fracture or dislocation. Electronically Signed   By: Suzen Dials M.D.   On: 12/20/2023 22:18   CT HEAD WO CONTRAST ( ) Result Date: 12/20/2023 CLINICAL DATA:  Polytrauma, blunt fall; Polytrauma, blunt EXAM: CT HEAD WITHOUT CONTRAST CT CERVICAL SPINE WITHOUT CONTRAST TECHNIQUE: Multidetector CT imaging of the head and cervical spine was performed following the standard protocol without intravenous contrast. Multiplanar CT image reconstructions of the cervical spine were also generated. RADIATION DOSE REDUCTION: This exam was performed according to the departmental dose-optimization program which includes automated exposure control, adjustment of the mA and/or kV according to patient size and/or use of iterative reconstruction technique. COMPARISON:  None Available. FINDINGS: CT HEAD FINDINGS Brain: Cerebral ventricle sizes are concordant with the degree of cerebral volume loss. Patchy and confluent areas of decreased attenuation are noted throughout the deep and periventricular white matter of the cerebral hemispheres bilaterally, compatible with chronic microvascular ischemic disease. Scattered cerebral parenchyma calcifications. No evidence of  large-territorial acute infarction. No parenchymal hemorrhage. No mass lesion. No extra-axial collection. No mass effect or midline shift. No hydrocephalus. Basilar cisterns are patent. Vascular: No hyperdense vessel. Skull: No acute fracture or focal lesion. Sinuses/Orbits: Almost complete opacification of left maxillary sinus. Otherwise paranasal sinuses and mastoid air cells are clear. Bilateral lens replacement. Otherwise the orbits are unremarkable. Other: None. CT CERVICAL SPINE FINDINGS Alignment: Grade 1 anterolisthesis of C4 on C5. Skull base and vertebrae: C7-T1 anterior cervical discectomy and fusion surgical hardware. Multilevel moderate severe degenerative changes of the spine. No associated severe osseous central canal stenosis. Associated moderate to severe left C5-C6 osseous neural foraminal stenosis. No acute fracture. No aggressive appearing focal osseous lesion or focal pathologic process. Soft tissues and spinal canal: No prevertebral fluid or swelling. No visible canal hematoma. Upper chest: Unremarkable. Other: Atherosclerotic plaque. IMPRESSION: 1. No acute intracranial abnormality. 2. No acute displaced fracture or traumatic listhesis of the cervical spine. 3. Scattered cerebral parenchyma calcifications. Findings likely sequelae of prior infection/inflammation. 4.  Aortic Atherosclerosis (ICD10-I70.0). Electronically Signed   By: Morgane  Naveau M.D.   On: 12/20/2023 22:05   CT Cervical Spine Wo Contrast  Result Date: 12/20/2023 CLINICAL DATA:  Polytrauma, blunt fall; Polytrauma, blunt EXAM: CT HEAD WITHOUT CONTRAST CT CERVICAL SPINE WITHOUT CONTRAST TECHNIQUE: Multidetector CT imaging of the head and cervical spine was performed following the standard protocol without intravenous contrast. Multiplanar CT image reconstructions of the cervical spine were also generated. RADIATION DOSE REDUCTION: This exam was performed according to the departmental dose-optimization program which includes  automated exposure control, adjustment of the mA and/or kV according to patient size and/or use of iterative reconstruction technique. COMPARISON:  None Available. FINDINGS: CT HEAD FINDINGS Brain: Cerebral ventricle sizes are concordant with the degree of cerebral volume loss. Patchy and confluent areas of decreased attenuation are noted throughout the deep and periventricular white matter of the cerebral hemispheres bilaterally, compatible with chronic microvascular ischemic disease. Scattered cerebral parenchyma calcifications. No evidence of large-territorial acute infarction. No parenchymal hemorrhage. No mass lesion. No extra-axial collection. No mass effect or midline shift. No hydrocephalus. Basilar cisterns are patent. Vascular: No hyperdense vessel. Skull: No acute fracture or focal lesion. Sinuses/Orbits: Almost complete opacification of left maxillary sinus. Otherwise paranasal sinuses and mastoid air cells are clear. Bilateral lens replacement. Otherwise the orbits are unremarkable. Other: None. CT CERVICAL SPINE FINDINGS Alignment: Grade 1 anterolisthesis of C4 on C5. Skull base and vertebrae: C7-T1 anterior cervical discectomy and fusion surgical hardware. Multilevel moderate severe degenerative changes of the spine. No associated severe osseous central canal stenosis. Associated moderate to severe left C5-C6 osseous neural foraminal stenosis. No acute fracture. No aggressive appearing focal osseous lesion or focal pathologic process. Soft tissues and spinal canal: No prevertebral fluid or swelling. No visible canal hematoma. Upper chest: Unremarkable. Other: Atherosclerotic plaque. IMPRESSION: 1. No acute intracranial abnormality. 2. No acute displaced fracture or traumatic listhesis of the cervical spine. 3. Scattered cerebral parenchyma calcifications. Findings likely sequelae of prior infection/inflammation. 4.  Aortic Atherosclerosis (ICD10-I70.0). Electronically Signed   By: Morgane  Naveau M.D.    On: 12/20/2023 22:05    Procedures Procedures    CRITICAL CARE Performed by: Lonni PARAS Jodiann Ognibene Total critical care time: 35 minutes Critical care time was exclusive of separately billable procedures and treating other patients. Critical care was necessary to treat or prevent imminent or life-threatening deterioration. Critical care was time spent personally by me on the following activities: development of treatment plan with patient and/or surrogate as well as nursing, discussions with consultants, evaluation of patient's response to treatment, examination of patient, obtaining history from patient or surrogate, ordering and performing treatments and interventions, ordering and review of laboratory studies, ordering and review of radiographic studies, pulse oximetry and re-evaluation of patient's condition.  Medications Ordered in ED Medications  fentaNYL  (SUBLIMAZE ) injection 50 mcg (50 mcg Intravenous Given 12/20/23 2123)  sodium chloride  0.9 % bolus 1,000 mL (1,000 mLs Intravenous New Bag/Given 12/20/23 2329)    ED Course/ Medical Decision Making/ A&P                                 Medical Decision Making Amount and/or Complexity of Data Reviewed Labs: ordered. Radiology: ordered.  Risk Prescription drug management. Decision regarding hospitalization.    [name] is a 88 y.o. female with a past medical history significant for previous DVT on Eliquis  therapy, paroxysmal atrial fibrillation, CHF, GERD, hypertension, thoracic ascending aortic aneurysm, previous rib fractures, pulmonary hypertension, and fibromyalgia who presents for a fall.  According to patient, she was reaching into the refrigerator when she  lost her balance and fell to the ground hitting her left side.  She thinks she may have hit her head but initially was not sure so did not tell EMS.  She has not been able to walk and has significant pain in her left hip and her low back.  She also says it hurts  to take a deep breath and was slightly hypoxic and route.  On my exam, patient is not complaining of headache but does agree that she did hit her head.  She has some mild neck soreness and pain in her low back.  Her lungs were clear bilaterally and chest was nontender.  Abdomen nontender.  Left hip is very tender to palpation and with any hip manipulation.  Distally she did have intact sensation strength and pulses.  Arms had good sensation strength and pulses.  She did not have significant tenderness in her mid back but had more tenderness in the neck and in the lumbar spine.  There was some palpable muscle spasm on exam.  Patient had CT imaging without contrast given her contrast allergy and it showed a left hip fracture.  I spoke to orthopedics with Dr. Cristy who recommended n.p.o. and admission to medicine and they will see her for likely surgery.  Due to her complex medical history they recommended medicine admission.  The CT also showed possible worsening of thoracic spine compression injury but patient was not hurting at that location on my initial exam.  I suspect this is more chronic.    Due to her blood pressure being on the soft side in the 80s, I spoke to trauma given the hypoxia, hip fracture, possible back injury, and soft pressures and they will see the patient.    11:35 PM Spoke to trauma who felt she is appropriate for a medicine service.  As the pressure was in the 80s systolic now, they recommended fluid resuscitation and see how she responds.  If she does not, she may need ICU.  They will see her and leave a consult note.  Patient was not focally tender in the mid back and was more tender in the upper back.  Orthopedics feels she needs admission and n.p.o. for likely surgery.  Will call medicine for admission versus ICU based on how she responds to fluid resuscitation.  Will give fluids and if pressure improves anticipate medicine admission.          Final Clinical  Impression(s) / ED Diagnoses Final diagnoses:  Closed fracture of left hip, initial encounter (HCC)    Clinical Impression: 1. Closed fracture of left hip, initial encounter [facility])     Disposition: Admit  This note was prepared with assistance of Dragon voice recognition software. Occasional wrong-word or sound-a-like substitutions may have occurred due to the inherent limitations of voice recognition software.     Haven Foss, Lonni PARAS, MD 12/21/23 [phone]

## 2023-12-20 NOTE — ED Notes (Signed)
 Patient transported to CT

## 2023-12-20 NOTE — Consult Note (Signed)
 [name] [birth year]  [id number].    Requesting MD: Tegeler Chief Complaint/Reason for Consult: Fall  HPI:  88 y/o F w/ a hx of chronic diastolic HF, severe MR, Afib on Eliquis , TAA, GERD, DVT, chronic compression fractures of the back, and pHTN who presented to the ED after a mechanical fall on to her left side. She typically uses a walker for assistance, but earlier today she was attempting to put something in the refrigerator when she slipped and fell onto her left side.  Possible head strike. Denies LOC.  CT imaging was performed and notable for a fracture of left femoral neck as well as multiple chronic back fractures with a ?T10 acute on chronic compression.  Her course in the ED has been complicated by hypoxia requiring 2L of O2 as well as hypotension with BP 80s/60s.  Labs are notable for a WBC of 20, Hb 13.3.   At the time of my exam the patient's BP was 100-100/60-70  Primary survey unremarkable Secondary survey notable for deformity of the LLE  She lives alone but has a home health aide who visits every morning.  Her sister helps with decision making but lives out of town.   ROS: Review of Systems  Constitutional: Negative.   HENT: Negative.    Eyes: Negative.   Respiratory: Negative.    Cardiovascular: Negative.   Gastrointestinal: Negative.   Genitourinary: Negative.   Musculoskeletal:  Positive for falls and joint pain.  Skin: Negative.   Neurological: Negative.   Endo/Heme/Allergies: Negative.   Psychiatric/Behavioral: Negative.      Family History  Problem Relation Age of Onset   Heart disease Mother    Heart disease Maternal Uncle    Heart disease Maternal Grandfather     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/20/2011   R breast DCIS, ER/PR +   Cataract 3 and 10/92   bilateral   Chronic diastolic CHF (congestive heart failure) (HCC)    Compression fracture of fourth lumbar  vertebra (HCC)    DVT (deep venous thrombosis) (HCC) 08/2011   LL extremity    Fibromyalgia    Fracture lumbar vertebra-closed (HCC)    Fracture of thoracic vertebra, closed (HCC)    GERD (gastroesophageal reflux disease)    H/O hiatal hernia    History of blood clots    History of radiation therapy 01/2012   R breast   Hypercholesteremia    Hypertension    DR JINNY BUDGE   Hypothyroidism    Mitral regurgitation    Mitral valve prolapse    Osteoporosis    PAF (paroxysmal atrial fibrillation) (HCC)    a. dx 11/2016.   Pulmonary hypertension (HCC)    Rib fractures    Thoracic ascending aortic aneurysm (HCC)    a. followed by Dr. Fleeta Ochoa.   Tricuspid regurgitation     Past Surgical History:  Procedure Laterality Date   ANTERIOR CERVICAL DECOMP/DISCECTOMY FUSION N/A 09/09/2015   Procedure: Anterior Cervical Decompression and Fusion Cervical seven-Thorasic one ;  Surgeon: Fairy Levels, MD;  Location: [facility];  Service: Neurosurgery;  Laterality: N/A;   APPENDECTOMY  1940   BREAST SURGERY     CATARACT EXTRACTION  1992   EYE SURGERY  1940, 1956   HERNIA REPAIR  10/16/2006   [facility] - Dr Gladis   KYPHOPLASTY N/A 01/26/2013   Procedure: KYPHOPLASTY;  Surgeon: Victory Gens,  MD;  Location: [facility];  Service: Neurosurgery;  Laterality: N/A;  T11 and L1   KYPHOPLASTY N/A 06/21/2017   Procedure: Lumbar four Kyphoplasty;  Surgeon: Unice Pac, MD;  Location: [facility];  Service: Neurosurgery;  Laterality: N/A;   MASTECTOMY, PARTIAL  10/17/2011   Procedure: MASTECTOMY PARTIAL;  Surgeon: Sherlean JINNY Laughter, MD;  Location: [facility] OR;  Service: General;  Laterality: Right;  needle guided   TEE WITHOUT CARDIOVERSION N/A 12/04/2016   Procedure: TRANSESOPHAGEAL ECHOCARDIOGRAM (TEE);  Surgeon: Vinie JAYSON Maxcy, MD;  Location: [facility] ENDOSCOPY;  Service: Cardiovascular;  Laterality: N/A;   TONSILLECTOMY  1944    Social History:  reports that she has never smoked. She has never used smokeless tobacco.  She reports that she does not drink alcohol and does not use drugs.  Allergies:  Allergies  Allergen Reactions   Contrast Media [Iodinated Contrast Media] Shortness Of Breath   Iohexol  Shortness Of Breath   Mucinex  [Guaifenesin  Er] Other (See Comments)    DIZZINESS   Zanaflex [Tizanidine Hcl] Anxiety and Other (See Comments)    Dizziness   Penicillins Rash    Has patient had a PCN reaction causing immediate rash, facial/tongue/throat swelling, SOB or lightheadedness with hypotension: No Has patient had a PCN reaction causing severe rash involving mucus membranes or skin necrosis: No Has patient had a PCN reaction that required hospitalization No Has patient had a PCN reaction occurring within the last 10 years: No If all of the above answers are NO, then may proceed with Cephalosporin use.    Robaxin  [Methocarbamol ] Diarrhea    (Not in a hospital admission)   Physical Exam: Blood pressure (!) 85/67, pulse 71, temperature 98 F (36.7 C), temperature source Oral, resp. rate 20, height 4' 11 (1.499 m), weight 42.2 kg, SpO2 94%. Gen: elderly female, NAD HEENT: atraumatic Resp: equal chest rise, non TTP, no deformity, on 2L [place] CV: BP 100-110/60-70 Abd: soft, non-distended, non-tender Neuro: moving all extremities Back: TTP along the thoracic and lumbar spine reported as chronic  Extremities: deformity of the LLE, + bilateral LE swelling (baseline per patient)  Results for orders placed or performed during the hospital encounter of 12/20/23 (from the past 48 hours)  CBC with Differential     Status: Abnormal   Collection Time: 12/20/23 10:20 PM  Result Value Ref Range   WBC 20.0 (H) 4.0 - 10.5 K/uL   RBC 4.24 3.87 - 5.11 MIL/uL   Hemoglobin 13.3 12.0 - 15.0 g/dL   HCT 62.0 63.9 - 53.9 %   MCV 89.4 80.0 - 100.0 fL   MCH 31.4 26.0 - 34.0 pg   MCHC 35.1 30.0 - 36.0 g/dL   RDW 85.6 88.4 - 84.4 %   Platelets 170 150 - 400 K/uL   nRBC 0.0 0.0 - 0.2 %   Neutrophils Relative %  86 %   Neutro Abs 17.2 (H) 1.7 - 7.7 K/uL   Lymphocytes Relative 3 %   Lymphs Abs 0.6 (L) 0.7 - 4.0 K/uL   Monocytes Relative 10 %   Monocytes Absolute 2.1 (H) 0.1 - 1.0 K/uL   Eosinophils Relative 0 %   Eosinophils Absolute 0.0 0.0 - 0.5 K/uL   Basophils Relative 0 %   Basophils Absolute 0.0 0.0 - 0.1 K/uL   Immature Granulocytes 1 %   Abs Immature Granulocytes 0.12 (H) 0.00 - 0.07 K/uL    Comment: Performed at [facility], [address] [place], [place] [address]  Comprehensive metabolic panel  Status: Abnormal   Collection Time: 12/20/23 10:20 PM  Result Value Ref Range   Sodium 138 135 - 145 mmol/L   Potassium 3.3 (L) 3.5 - 5.1 mmol/L   Chloride 98 98 - 111 mmol/L   CO2 25 22 - 32 mmol/L   Glucose, Bld 141 (H) 70 - 99 mg/dL    Comment: Glucose reference range applies only to samples taken after fasting for at least 8 hours.   BUN 26 (H) 8 - 23 mg/dL   Creatinine, Ser 9.02 0.44 - 1.00 mg/dL   Calcium  9.2 8.9 - 10.3 mg/dL   Total Protein 6.1 (L) 6.5 - 8.1 g/dL   Albumin  3.4 (L) 3.5 - 5.0 g/dL   AST 45 (H) 15 - 41 U/L   ALT 56 (H) 0 - 44 U/L   Alkaline Phosphatase 47 38 - 126 U/L   Total Bilirubin 0.9 0.0 - 1.2 mg/dL   GFR, Estimated 57 (L) >60 mL/min    Comment: (NOTE) Calculated using the CKD-EPI Creatinine Equation (2021)    Anion gap 15 5 - 15    Comment: Performed at [facility], [address] [place], [place] [address]   CT T-SPINE NO CHARGE Result Date: 12/20/2023 CLINICAL DATA:  fall EXAM: CT Thoracic and Lumbar spine without contrast TECHNIQUE: Multiplanar CT images of the thoracic and lumbar spine were reconstructed from contemporary CT of the Chest, Abdomen, and Pelvis. RADIATION DOSE REDUCTION: This exam was performed according to the departmental dose-optimization program which includes automated exposure control, adjustment of the mA and/or kV according to patient size and/or use of iterative reconstruction technique. CONTRAST:  None or No  additional COMPARISON:  None Available. FINDINGS: CT THORACIC SPINE FINDINGS Alignment: Normal. Vertebrae: Diffusely decreased bone density. C7-T1 anterior cervical discectomy and fusion surgical hardware. Interval worsening of T10 superior endplate compression fracture with 40% vertebral body height. T8, T9, T11, T12 chronic stable fractures. Kyphoplasty of T11. No acute fracture or focal pathologic process. Paraspinal and other soft tissues: Negative. Disc levels: Multilevel severe intervertebral disc space narrowing. CT LUMBAR SPINE FINDINGS Segmentation: 5 lumbar type vertebrae. Alignment: Normal. Vertebrae: Diffusely decreased bone density. Chronic stable L1, L2, L3, L4 fractures status post L1 and L4 kyphoplasty. No acute fracture or focal pathologic process. Paraspinal and other soft tissues: Negative. Disc levels: Multilevel intervertebral disc space vacuum phenomenon. IMPRESSION: 1. Interval worsening of T10 superior endplate compression fracture with 40% vertebral body height. Correlate with point tenderness to palpation for an acute component. 2. Multilevel chronic stable thoracolumbar fractures. 3. Diffusely decreased bone density. Electronically Signed   By: Morgane  Naveau M.D.   On: 12/20/2023 22:43   CT L-SPINE NO CHARGE Result Date: 12/20/2023 CLINICAL DATA:  fall EXAM: CT Thoracic and Lumbar spine without contrast TECHNIQUE: Multiplanar CT images of the thoracic and lumbar spine were reconstructed from contemporary CT of the Chest, Abdomen, and Pelvis. RADIATION DOSE REDUCTION: This exam was performed according to the departmental dose-optimization program which includes automated exposure control, adjustment of the mA and/or kV according to patient size and/or use of iterative reconstruction technique. CONTRAST:  None or No additional COMPARISON:  None Available. FINDINGS: CT THORACIC SPINE FINDINGS Alignment: Normal. Vertebrae: Diffusely decreased bone density. C7-T1 anterior cervical  discectomy and fusion surgical hardware. Interval worsening of T10 superior endplate compression fracture with 40% vertebral body height. T8, T9, T11, T12 chronic stable fractures. Kyphoplasty of T11. No acute fracture or focal pathologic process. Paraspinal and other soft tissues: Negative. Disc levels: Multilevel  severe intervertebral disc space narrowing. CT LUMBAR SPINE FINDINGS Segmentation: 5 lumbar type vertebrae. Alignment: Normal. Vertebrae: Diffusely decreased bone density. Chronic stable L1, L2, L3, L4 fractures status post L1 and L4 kyphoplasty. No acute fracture or focal pathologic process. Paraspinal and other soft tissues: Negative. Disc levels: Multilevel intervertebral disc space vacuum phenomenon. IMPRESSION: 1. Interval worsening of T10 superior endplate compression fracture with 40% vertebral body height. Correlate with point tenderness to palpation for an acute component. 2. Multilevel chronic stable thoracolumbar fractures. 3. Diffusely decreased bone density. Electronically Signed   By: Morgane  Naveau M.D.   On: 12/20/2023 22:43   CT CHEST ABDOMEN PELVIS WO CONTRAST Result Date: 12/20/2023 CLINICAL DATA:  Polytrauma, blunt fall, back pain, hlp pain. hit head. eliquis  use. EXAM: CT CHEST, ABDOMEN AND PELVIS WITHOUT CONTRAST TECHNIQUE: Multidetector CT imaging of the chest, abdomen and pelvis was performed following the standard protocol without IV contrast. RADIATION DOSE REDUCTION: This exam was performed according to the departmental dose-optimization program which includes automated exposure control, adjustment of the mA and/or kV according to patient size and/or use of iterative reconstruction technique. COMPARISON:  CT chest abdomen pelvis 11/22/2022, CT pelvis 06/08/2011, CT abdomen pelvis 05/30/2017 FINDINGS: CHEST: Cardiovascular: Stable aneurysmal ascending thoracic aorta measuring up to 4.6 cm. The heart is normal in size. No significant pericardial effusion. Severe atherosclerotic  plaque. Mitral annular calcification. Coronary artery calcification. Lungs/Pleura: Left lower lobe passive atelectasis. No focal consolidation. No pulmonary nodule. No pulmonary mass. No pulmonary contusion or laceration. No pneumatocele formation. No pleural effusion. No pneumothorax. No hemothorax. Mediastinum/Nodes: No pneumomediastinum. The central airways are patent. The esophagus is unremarkable. Stable large hiatal hernia containing the entire stomach. The thyroid  is unremarkable. Limited evaluation for hilar lymphadenopathy on this noncontrast study. No mediastinal or axillary lymphadenopathy. Musculoskeletal/Chest wall No chest wall mass. Diffusely decreased bone density. Old healed right rib fractures. No acute rib or sternal fracture. Please see separately dictated CT thoracolumbar spine 12/20/2023. ABDOMEN / PELVIS: Hepatobiliary: Not enlarged. Hyperdense hepatic parenchyma.  No focal lesion. The gallbladder is otherwise unremarkable with no radio-opaque gallstones. No biliary ductal dilatation. Pancreas: Normal pancreatic contour. No main pancreatic duct dilatation. Spleen: Not enlarged. No focal lesion. Adrenals/Urinary Tract: No nodularity bilaterally. No hydroureteronephrosis. No nephroureterolithiasis. No contour deforming renal mass. The urinary bladder is unremarkable. Stomach/Bowel: No small or large bowel wall thickening or dilatation. Stool throughout the majority of the colon. The appendix is unremarkable. Vasculature/Lymphatic: Moderate atherosclerotic plaque. No abdominal aorta or iliac aneurysm. No abdominal, pelvic, inguinal lymphadenopathy. Reproductive: Normal. Other: No simple free fluid ascites. No pneumoperitoneum. No mesenteric hematoma identified. No organized fluid collection. Musculoskeletal: No significant soft tissue hematoma. Persistent densely sclerotic right iliac bone lesion. Diffusely decreased bone density. Acute impacted left femoral neck fracture. Please see separately  dictated CT thoracolumbar spine 12/20/2023. Ports and Devices: None. IMPRESSION: 1. No acute intrathoracic, intra-abdominal, intrapelvic traumatic injury with limited evaluation on this noncontrast study. 2. Please see separately dictated CT thoracolumbar spine 12/20/2023. 3. Stable aneurysmal ascending thoracic aorta (4.6 cm). Ascending thoracic aortic aneurysm. Recommend semi-annual imaging followup by CTA or MRA and referral to cardiothoracic surgery if not already obtained. This recommendation follows 2010 ACCF/AHA/AATS/ACR/ASA/SCA/SCAI/SIR/STS/SVM Guidelines for the Diagnosis and Management of Patients With Thoracic Aortic Disease. Circulation. 2010; 121: [id number]. Aortic aneurysm NOS (ICD10-I71.9). Aortic aneurysm NOS (ICD10-I71.9). 4. Aortic Atherosclerosis (ICD10-I70.0) including coronary artery and mitral annular calcification. 5. Acute impacted left femoral neck fracture. 6. Constipation. Electronically Signed   By: Morgane  Naveau M.D.   On:  12/20/2023 22:29   DG Ankle Complete Right Result Date: 12/20/2023 CLINICAL DATA:  Status post fall. EXAM: RIGHT ANKLE - COMPLETE 3+ VIEW COMPARISON:  None Available. FINDINGS: There is no evidence of fracture, dislocation, or joint effusion. There is no evidence of arthropathy or other focal bone abnormality. Moderate severity soft tissue swelling is seen along the medial aspect of the distal right tibial shaft. IMPRESSION: Medial soft tissue swelling without evidence of acute fracture or dislocation. Electronically Signed   By: Suzen Dials M.D.   On: 12/20/2023 22:21   DG Tibia/Fibula Right Result Date: 12/20/2023 CLINICAL DATA:  Status post fall. EXAM: RIGHT TIBIA AND FIBULA - 2 VIEW COMPARISON:  None Available. FINDINGS: There is no evidence of fracture or other focal bone lesions. Mild to moderate severity focal soft tissue swelling is seen along the medial aspect of the distal right tibial shaft. IMPRESSION: Focal soft tissue swelling along the medial  aspect of the distal right tibial shaft. Electronically Signed   By: Suzen Dials M.D.   On: 12/20/2023 22:19   DG Hip [place] Pelvis 1 View Left Result Date: 12/20/2023 CLINICAL DATA:  Status post fall. EXAM: DG HIP (WITH OR WITHOUT PELVIS) 1V PORT LEFT COMPARISON:  None Available. FINDINGS: There is no evidence of an acute hip fracture or dislocation. Mild degenerative changes are seen in the form of joint space narrowing and acetabular sclerosis. IMPRESSION: Mild degenerative changes without evidence of an acute fracture or dislocation. Electronically Signed   By: Suzen Dials M.D.   On: 12/20/2023 22:18   CT HEAD WO CONTRAST ( ) Result Date: 12/20/2023 CLINICAL DATA:  Polytrauma, blunt fall; Polytrauma, blunt EXAM: CT HEAD WITHOUT CONTRAST CT CERVICAL SPINE WITHOUT CONTRAST TECHNIQUE: Multidetector CT imaging of the head and cervical spine was performed following the standard protocol without intravenous contrast. Multiplanar CT image reconstructions of the cervical spine were also generated. RADIATION DOSE REDUCTION: This exam was performed according to the departmental dose-optimization program which includes automated exposure control, adjustment of the mA and/or kV according to patient size and/or use of iterative reconstruction technique. COMPARISON:  None Available. FINDINGS: CT HEAD FINDINGS Brain: Cerebral ventricle sizes are concordant with the degree of cerebral volume loss. Patchy and confluent areas of decreased attenuation are noted throughout the deep and periventricular white matter of the cerebral hemispheres bilaterally, compatible with chronic microvascular ischemic disease. Scattered cerebral parenchyma calcifications. No evidence of large-territorial acute infarction. No parenchymal hemorrhage. No mass lesion. No extra-axial collection. No mass effect or midline shift. No hydrocephalus. Basilar cisterns are patent. Vascular: No hyperdense vessel. Skull: No acute  fracture or focal lesion. Sinuses/Orbits: Almost complete opacification of left maxillary sinus. Otherwise paranasal sinuses and mastoid air cells are clear. Bilateral lens replacement. Otherwise the orbits are unremarkable. Other: None. CT CERVICAL SPINE FINDINGS Alignment: Grade 1 anterolisthesis of C4 on C5. Skull base and vertebrae: C7-T1 anterior cervical discectomy and fusion surgical hardware. Multilevel moderate severe degenerative changes of the spine. No associated severe osseous central canal stenosis. Associated moderate to severe left C5-C6 osseous neural foraminal stenosis. No acute fracture. No aggressive appearing focal osseous lesion or focal pathologic process. Soft tissues and spinal canal: No prevertebral fluid or swelling. No visible canal hematoma. Upper chest: Unremarkable. Other: Atherosclerotic plaque. IMPRESSION: 1. No acute intracranial abnormality. 2. No acute displaced fracture or traumatic listhesis of the cervical spine. 3. Scattered cerebral parenchyma calcifications. Findings likely sequelae of prior infection/inflammation. 4.  Aortic Atherosclerosis (ICD10-I70.0). Electronically Signed  By: Morgane  Naveau M.D.   On: 12/20/2023 22:05   CT Cervical Spine Wo Contrast Result Date: 12/20/2023 CLINICAL DATA:  Polytrauma, blunt fall; Polytrauma, blunt EXAM: CT HEAD WITHOUT CONTRAST CT CERVICAL SPINE WITHOUT CONTRAST TECHNIQUE: Multidetector CT imaging of the head and cervical spine was performed following the standard protocol without intravenous contrast. Multiplanar CT image reconstructions of the cervical spine were also generated. RADIATION DOSE REDUCTION: This exam was performed according to the departmental dose-optimization program which includes automated exposure control, adjustment of the mA and/or kV according to patient size and/or use of iterative reconstruction technique. COMPARISON:  None Available. FINDINGS: CT HEAD FINDINGS Brain: Cerebral ventricle sizes are  concordant with the degree of cerebral volume loss. Patchy and confluent areas of decreased attenuation are noted throughout the deep and periventricular white matter of the cerebral hemispheres bilaterally, compatible with chronic microvascular ischemic disease. Scattered cerebral parenchyma calcifications. No evidence of large-territorial acute infarction. No parenchymal hemorrhage. No mass lesion. No extra-axial collection. No mass effect or midline shift. No hydrocephalus. Basilar cisterns are patent. Vascular: No hyperdense vessel. Skull: No acute fracture or focal lesion. Sinuses/Orbits: Almost complete opacification of left maxillary sinus. Otherwise paranasal sinuses and mastoid air cells are clear. Bilateral lens replacement. Otherwise the orbits are unremarkable. Other: None. CT CERVICAL SPINE FINDINGS Alignment: Grade 1 anterolisthesis of C4 on C5. Skull base and vertebrae: C7-T1 anterior cervical discectomy and fusion surgical hardware. Multilevel moderate severe degenerative changes of the spine. No associated severe osseous central canal stenosis. Associated moderate to severe left C5-C6 osseous neural foraminal stenosis. No acute fracture. No aggressive appearing focal osseous lesion or focal pathologic process. Soft tissues and spinal canal: No prevertebral fluid or swelling. No visible canal hematoma. Upper chest: Unremarkable. Other: Atherosclerotic plaque. IMPRESSION: 1. No acute intracranial abnormality. 2. No acute displaced fracture or traumatic listhesis of the cervical spine. 3. Scattered cerebral parenchyma calcifications. Findings likely sequelae of prior infection/inflammation. 4.  Aortic Atherosclerosis (ICD10-I70.0). Electronically Signed   By: Morgane  Naveau M.D.   On: 12/20/2023 22:05    Assessment/Plan 88 y/o F w/ a complicated PMH including severe MR, Afib on Eliquis , diastolic HF, DVT, GERD, and chronic compression fractures of the back who presents after a mechanical fall  L  femur fx - ortho consulted, plan for possible OR tomorrow, NWB LLE ?Acute on chronic T10 compression fx - she reports back pain at baseline.  Can re-assess following surgery for her femur to determine need for spine consult  FEN - Okay for a diet from trauma perspective, NPO at [facility] for planned OR with ortho VTE - Holding ID - None indicated Admit - Given her multiple comorbidity including severe MR she would be best managed on a medicine service.  Trauma will follow along for now.   Cordella DELENA Polly Marlis Cheron Surgery 12/20/2023, 11:27 PM Please see Amion for pager number during day hours 7:00am-4:30pm or 7:00am -11:30am on weekends

## 2023-12-20 NOTE — ED Notes (Signed)
 Sister called for an update  Carolyn Ezzell [phone]

## 2023-12-20 NOTE — ED Triage Notes (Signed)
 Patient BIB EMS from home. She has a caregiver that comes and helps her at home some. Patient walks with a walker, walked tonight without her walker and had a mechanical fall landing on her L side. Patient is c/o L lower back pain that extends down into her L hip/ Patient is on Eliquis  (A-fib) but did not strike her head. Patient is alert/oriented.

## 2023-12-21 ENCOUNTER — Encounter (HOSPITAL_COMMUNITY)
Admission: EM | Disposition: A | Discharge status: Skilled Nursing Facility | Payer: Self-pay | Source: Home / Self Care | Attending: Internal Medicine

## 2023-12-21 ENCOUNTER — Other Ambulatory Visit: Payer: Self-pay

## 2023-12-21 ENCOUNTER — Inpatient Hospital Stay (HOSPITAL_COMMUNITY): Payer: Medicare PPO

## 2023-12-21 ENCOUNTER — Inpatient Hospital Stay (HOSPITAL_COMMUNITY): Payer: Medicare PPO | Admitting: Anesthesiology

## 2023-12-21 ENCOUNTER — Encounter (HOSPITAL_COMMUNITY): Payer: Self-pay | Admitting: Internal Medicine

## 2023-12-21 DIAGNOSIS — Z66 Do not resuscitate: Secondary | ICD-10-CM

## 2023-12-21 DIAGNOSIS — D62 Acute posthemorrhagic anemia: Secondary | ICD-10-CM | POA: Diagnosis not present

## 2023-12-21 DIAGNOSIS — S8011XA Contusion of right lower leg, initial encounter: Secondary | ICD-10-CM

## 2023-12-21 DIAGNOSIS — I110 Hypertensive heart disease with heart failure: Secondary | ICD-10-CM | POA: Diagnosis present

## 2023-12-21 DIAGNOSIS — S8011XD Contusion of right lower leg, subsequent encounter: Secondary | ICD-10-CM

## 2023-12-21 DIAGNOSIS — D72829 Elevated white blood cell count, unspecified: Secondary | ICD-10-CM

## 2023-12-21 DIAGNOSIS — S72002A Fracture of unspecified part of neck of left femur, initial encounter for closed fracture: Secondary | ICD-10-CM

## 2023-12-21 DIAGNOSIS — T45515A Adverse effect of anticoagulants, initial encounter: Secondary | ICD-10-CM | POA: Diagnosis present

## 2023-12-21 DIAGNOSIS — E43 Unspecified severe protein-calorie malnutrition: Secondary | ICD-10-CM | POA: Diagnosis present

## 2023-12-21 DIAGNOSIS — E876 Hypokalemia: Secondary | ICD-10-CM

## 2023-12-21 DIAGNOSIS — S72002D Fracture of unspecified part of neck of left femur, subsequent encounter for closed fracture with routine healing: Secondary | ICD-10-CM | POA: Diagnosis not present

## 2023-12-21 DIAGNOSIS — W010XXA Fall on same level from slipping, tripping and stumbling without subsequent striking against object, initial encounter: Secondary | ICD-10-CM | POA: Diagnosis present

## 2023-12-21 DIAGNOSIS — I480 Paroxysmal atrial fibrillation: Secondary | ICD-10-CM | POA: Diagnosis present

## 2023-12-21 DIAGNOSIS — D696 Thrombocytopenia, unspecified: Secondary | ICD-10-CM | POA: Diagnosis not present

## 2023-12-21 DIAGNOSIS — Z681 Body mass index (BMI) 19 or less, adult: Secondary | ICD-10-CM | POA: Diagnosis not present

## 2023-12-21 DIAGNOSIS — Z86718 Personal history of other venous thrombosis and embolism: Secondary | ICD-10-CM

## 2023-12-21 DIAGNOSIS — Z79899 Other long term (current) drug therapy: Secondary | ICD-10-CM | POA: Diagnosis not present

## 2023-12-21 DIAGNOSIS — E039 Hypothyroidism, unspecified: Secondary | ICD-10-CM | POA: Diagnosis present

## 2023-12-21 DIAGNOSIS — I10 Essential (primary) hypertension: Secondary | ICD-10-CM

## 2023-12-21 DIAGNOSIS — S22070D Wedge compression fracture of T9-T10 vertebra, subsequent encounter for fracture with routine healing: Secondary | ICD-10-CM | POA: Diagnosis not present

## 2023-12-21 DIAGNOSIS — E871 Hypo-osmolality and hyponatremia: Secondary | ICD-10-CM | POA: Diagnosis not present

## 2023-12-21 DIAGNOSIS — I5032 Chronic diastolic (congestive) heart failure: Secondary | ICD-10-CM | POA: Diagnosis present

## 2023-12-21 DIAGNOSIS — E869 Volume depletion, unspecified: Secondary | ICD-10-CM | POA: Diagnosis present

## 2023-12-21 DIAGNOSIS — Z79890 Hormone replacement therapy (postmenopausal): Secondary | ICD-10-CM | POA: Diagnosis not present

## 2023-12-21 DIAGNOSIS — Y92009 Unspecified place in unspecified non-institutional (private) residence as the place of occurrence of the external cause: Secondary | ICD-10-CM | POA: Diagnosis not present

## 2023-12-21 DIAGNOSIS — R64 Cachexia: Secondary | ICD-10-CM | POA: Diagnosis present

## 2023-12-21 DIAGNOSIS — M4854XA Collapsed vertebra, not elsewhere classified, thoracic region, initial encounter for fracture: Secondary | ICD-10-CM | POA: Diagnosis present

## 2023-12-21 DIAGNOSIS — I272 Pulmonary hypertension, unspecified: Secondary | ICD-10-CM | POA: Diagnosis present

## 2023-12-21 DIAGNOSIS — D6832 Hemorrhagic disorder due to extrinsic circulating anticoagulants: Secondary | ICD-10-CM | POA: Diagnosis present

## 2023-12-21 DIAGNOSIS — M797 Fibromyalgia: Secondary | ICD-10-CM | POA: Diagnosis present

## 2023-12-21 DIAGNOSIS — E7800 Pure hypercholesterolemia, unspecified: Secondary | ICD-10-CM | POA: Diagnosis present

## 2023-12-21 DIAGNOSIS — I7121 Aneurysm of the ascending aorta, without rupture: Secondary | ICD-10-CM | POA: Diagnosis present

## 2023-12-21 DIAGNOSIS — Z7901 Long term (current) use of anticoagulants: Secondary | ICD-10-CM | POA: Diagnosis not present

## 2023-12-21 HISTORY — PX: HIP PINNING,CANNULATED: SHX1758

## 2023-12-21 LAB — CBC
HCT: 35.7 % — ABNORMAL LOW (ref 36.0–46.0)
Hemoglobin: 12.4 g/dL (ref 12.0–15.0)
MCH: 30.8 pg (ref 26.0–34.0)
MCHC: 34.7 g/dL (ref 30.0–36.0)
MCV: 88.6 fL (ref 80.0–100.0)
Platelets: 151 10*3/uL (ref 150–400)
RBC: 4.03 MIL/uL (ref 3.87–5.11)
RDW: 14.5 % (ref 11.5–15.5)
WBC: 18.1 10*3/uL — ABNORMAL HIGH (ref 4.0–10.5)
nRBC: 0 % (ref 0.0–0.2)

## 2023-12-21 LAB — CBC WITH DIFFERENTIAL/PLATELET
Abs Immature Granulocytes: 0.13 K/uL — ABNORMAL HIGH (ref 0.00–0.07)
Basophils Absolute: 0 K/uL (ref 0.0–0.1)
Basophils Relative: 0 %
Eosinophils Absolute: 0 K/uL (ref 0.0–0.5)
Eosinophils Relative: 0 %
HCT: 36.7 % (ref 36.0–46.0)
Hemoglobin: 13.1 g/dL (ref 12.0–15.0)
Immature Granulocytes: 1 %
Lymphocytes Relative: 3 %
Lymphs Abs: 0.5 K/uL — ABNORMAL LOW (ref 0.7–4.0)
MCH: 31.6 pg (ref 26.0–34.0)
MCHC: 35.7 g/dL (ref 30.0–36.0)
MCV: 88.4 fL (ref 80.0–100.0)
Monocytes Absolute: 1.4 K/uL — ABNORMAL HIGH (ref 0.1–1.0)
Monocytes Relative: 7 %
Neutro Abs: 17.8 K/uL — ABNORMAL HIGH (ref 1.7–7.7)
Neutrophils Relative %: 89 %
Platelets: 170 K/uL (ref 150–400)
RBC: 4.15 MIL/uL (ref 3.87–5.11)
RDW: 14.4 % (ref 11.5–15.5)
WBC: 19.9 K/uL — ABNORMAL HIGH (ref 4.0–10.5)
nRBC: 0 % (ref 0.0–0.2)

## 2023-12-21 LAB — COMPREHENSIVE METABOLIC PANEL
ALT: 60 U/L — ABNORMAL HIGH (ref 0–44)
AST: 49 U/L — ABNORMAL HIGH (ref 15–41)
Albumin: 3.3 g/dL — ABNORMAL LOW (ref 3.5–5.0)
Alkaline Phosphatase: 46 U/L (ref 38–126)
Anion gap: 13 (ref 5–15)
BUN: 26 mg/dL — ABNORMAL HIGH (ref 8–23)
CO2: 22 mmol/L (ref 22–32)
Calcium: 8.4 mg/dL — ABNORMAL LOW (ref 8.9–10.3)
Chloride: 101 mmol/L (ref 98–111)
Creatinine, Ser: 0.9 mg/dL (ref 0.44–1.00)
GFR, Estimated: >60 mL/min (ref >60)
Glucose, Bld: 122 mg/dL — ABNORMAL HIGH (ref 70–99)
Potassium: 4.1 mmol/L (ref 3.5–5.1)
Sodium: 136 mmol/L (ref 135–145)
Total Bilirubin: 0.9 mg/dL (ref 0.0–1.2)
Total Protein: 6 g/dL — ABNORMAL LOW (ref 6.5–8.1)

## 2023-12-21 LAB — URINALYSIS, W/ REFLEX TO CULTURE (INFECTION SUSPECTED)
Bacteria, UA: NONE SEEN
Bilirubin Urine: NEGATIVE
Glucose, UA: NEGATIVE mg/dL
Hgb urine dipstick: NEGATIVE
Ketones, ur: NEGATIVE mg/dL
Leukocytes,Ua: NEGATIVE
Nitrite: NEGATIVE
Protein, ur: NEGATIVE mg/dL
Specific Gravity, Urine: 1.008 (ref 1.005–1.030)
pH: 7 (ref 5.0–8.0)

## 2023-12-21 LAB — SURGICAL PCR SCREEN
MRSA, PCR: NEGATIVE
Staphylococcus aureus: NEGATIVE

## 2023-12-21 LAB — CK: Total CK: 90 U/L (ref 38–234)

## 2023-12-21 SURGERY — FIXATION, FEMUR, NECK, PERCUTANEOUS, USING SCREW
Anesthesia: General | Site: Hip | Laterality: Left

## 2023-12-21 MED ORDER — LIDOCAINE 2% (20 MG/ML) 5 ML SYRINGE
INTRAMUSCULAR | Status: AC
  Filled 2023-12-21: qty 5

## 2023-12-21 MED ORDER — PHENOL 1.4 % MT LIQD: 1.0000 | OROMUCOSAL | Status: DC | PRN

## 2023-12-21 MED ORDER — FENTANYL CITRATE PF 50 MCG/ML IJ SOSY
25.0000 ug | PREFILLED_SYRINGE | Freq: Once | INTRAMUSCULAR | Status: AC
  Administered 2023-12-21: 25 ug via INTRAVENOUS
  Filled 2023-12-21: qty 1

## 2023-12-21 MED ORDER — DILTIAZEM HCL ER COATED BEADS 120 MG PO CP24
120.0000 mg | ORAL_CAPSULE | Freq: Every day | ORAL | Status: DC
  Filled 2023-12-21: qty 1

## 2023-12-21 MED ORDER — FENTANYL CITRATE (PF) 100 MCG/2ML IJ SOLN
25.0000 ug | INTRAMUSCULAR | Status: DC | PRN
  Administered 2023-12-21 (×3): 25 ug via INTRAVENOUS

## 2023-12-21 MED ORDER — HYDROCODONE-ACETAMINOPHEN 7.5-325 MG PO TABS
1.0000 | ORAL_TABLET | ORAL | Status: DC | PRN
  Administered 2023-12-22 (×2): 1 via ORAL
  Filled 2023-12-21 (×2): qty 1

## 2023-12-21 MED ORDER — FENTANYL CITRATE (PF) 250 MCG/5ML IJ SOLN
INTRAMUSCULAR | Status: AC
  Filled 2023-12-21: qty 5

## 2023-12-21 MED ORDER — HYDROMORPHONE HCL 1 MG/ML IJ SOLN
0.5000 mg | INTRAMUSCULAR | Status: DC | PRN
  Administered 2023-12-21: 0.5 mg via INTRAVENOUS
  Filled 2023-12-21: qty 0.5

## 2023-12-21 MED ORDER — PHENYLEPHRINE 80 MCG/ML (10ML) SYRINGE FOR IV PUSH (FOR BLOOD PRESSURE SUPPORT)
PREFILLED_SYRINGE | INTRAVENOUS | Status: DC | PRN
  Administered 2023-12-21 (×5): 80 ug via INTRAVENOUS

## 2023-12-21 MED ORDER — ROCURONIUM BROMIDE 10 MG/ML (PF) SYRINGE
PREFILLED_SYRINGE | INTRAVENOUS | Status: AC
  Filled 2023-12-21: qty 10

## 2023-12-21 MED ORDER — HYDROCODONE-ACETAMINOPHEN 5-325 MG PO TABS
ORAL_TABLET | ORAL | Status: AC
  Filled 2023-12-21: qty 1

## 2023-12-21 MED ORDER — LACTATED RINGERS IV SOLN: INTRAVENOUS | Status: AC

## 2023-12-21 MED ORDER — FUROSEMIDE 20 MG PO TABS: 20.0000 mg | ORAL_TABLET | Freq: Every day | ORAL | Status: DC

## 2023-12-21 MED ORDER — ONDANSETRON HCL 4 MG/2ML IJ SOLN
INTRAMUSCULAR | Status: AC
  Filled 2023-12-21: qty 2

## 2023-12-21 MED ORDER — BUPIVACAINE HCL (PF) 0.25 % IJ SOLN
INTRAMUSCULAR | Status: DC | PRN
  Administered 2023-12-21: 30 mL

## 2023-12-21 MED ORDER — AMIODARONE HCL 200 MG PO TABS
200.0000 mg | ORAL_TABLET | Freq: Every day | ORAL | Status: DC
  Administered 2023-12-22 – 2023-12-26 (×5): 200 mg via ORAL
  Filled 2023-12-21 (×5): qty 1

## 2023-12-21 MED ORDER — PANTOPRAZOLE SODIUM 40 MG PO TBEC
40.0000 mg | DELAYED_RELEASE_TABLET | Freq: Every day | ORAL | Status: DC
  Administered 2023-12-21 – 2023-12-26 (×6): 40 mg via ORAL
  Filled 2023-12-21 (×6): qty 1

## 2023-12-21 MED ORDER — POLYETHYLENE GLYCOL 3350 17 G PO PACK
17.0000 g | PACK | Freq: Every day | ORAL | Status: DC | PRN
  Filled 2023-12-21: qty 1

## 2023-12-21 MED ORDER — SUGAMMADEX SODIUM 200 MG/2ML IV SOLN
INTRAVENOUS | Status: DC | PRN
  Administered 2023-12-21: 100 mg via INTRAVENOUS

## 2023-12-21 MED ORDER — FENTANYL CITRATE (PF) 100 MCG/2ML IJ SOLN
INTRAMUSCULAR | Status: AC
  Administered 2023-12-21: 25 ug via INTRAVENOUS
  Filled 2023-12-21: qty 2

## 2023-12-21 MED ORDER — ALBUMIN HUMAN 5 % IV SOLN: INTRAVENOUS | Status: DC | PRN

## 2023-12-21 MED ORDER — ACETAMINOPHEN 500 MG PO TABS
500.0000 mg | ORAL_TABLET | Freq: Four times a day (QID) | ORAL | Status: AC
  Administered 2023-12-21 (×2): 500 mg via ORAL
  Filled 2023-12-21 (×3): qty 1

## 2023-12-21 MED ORDER — ENSURE ENLIVE PO LIQD
237.0000 mL | Freq: Two times a day (BID) | ORAL | Status: DC
Start: 2023-12-21 — End: 2023-12-24
  Administered 2023-12-21 – 2023-12-24 (×4): 237 mL via ORAL

## 2023-12-21 MED ORDER — PROPOFOL 10 MG/ML IV BOLUS
INTRAVENOUS | Status: AC
  Filled 2023-12-21: qty 20

## 2023-12-21 MED ORDER — SPIRONOLACTONE 12.5 MG HALF TABLET
12.5000 mg | ORAL_TABLET | Freq: Every day | ORAL | Status: DC
  Filled 2023-12-21: qty 1

## 2023-12-21 MED ORDER — FENTANYL CITRATE (PF) 250 MCG/5ML IJ SOLN
INTRAMUSCULAR | Status: DC | PRN
  Administered 2023-12-21 (×2): 25 ug via INTRAVENOUS

## 2023-12-21 MED ORDER — ONDANSETRON HCL 4 MG/2ML IJ SOLN
4.0000 mg | Freq: Four times a day (QID) | INTRAMUSCULAR | Status: DC | PRN
  Administered 2023-12-21 – 2023-12-23 (×2): 4 mg via INTRAVENOUS
  Filled 2023-12-21 (×2): qty 2

## 2023-12-21 MED ORDER — DROPERIDOL 2.5 MG/ML IJ SOLN: 0.6250 mg | Freq: Once | INTRAMUSCULAR | Status: DC | PRN

## 2023-12-21 MED ORDER — CHLORHEXIDINE GLUCONATE 0.12 % MT SOLN
OROMUCOSAL | Status: AC
  Administered 2023-12-21: 15 mL via OROMUCOSAL
  Filled 2023-12-21: qty 15

## 2023-12-21 MED ORDER — DEXAMETHASONE SODIUM PHOSPHATE 10 MG/ML IJ SOLN
INTRAMUSCULAR | Status: AC
  Filled 2023-12-21: qty 1

## 2023-12-21 MED ORDER — ENOXAPARIN SODIUM 40 MG/0.4ML IJ SOSY: 40.0000 mg | PREFILLED_SYRINGE | INTRAMUSCULAR | Status: DC

## 2023-12-21 MED ORDER — ACETAMINOPHEN 10 MG/ML IV SOLN
INTRAVENOUS | Status: DC | PRN
  Administered 2023-12-21: 630 mg via INTRAVENOUS

## 2023-12-21 MED ORDER — MENTHOL 3 MG MT LOZG: 1.0000 | LOZENGE | OROMUCOSAL | Status: DC | PRN

## 2023-12-21 MED ORDER — BUPIVACAINE HCL (PF) 0.25 % IJ SOLN
INTRAMUSCULAR | Status: AC
  Filled 2023-12-21: qty 30

## 2023-12-21 MED ORDER — ROCURONIUM BROMIDE 10 MG/ML (PF) SYRINGE
PREFILLED_SYRINGE | INTRAVENOUS | Status: DC | PRN
  Administered 2023-12-21: 30 mg via INTRAVENOUS

## 2023-12-21 MED ORDER — METOCLOPRAMIDE HCL 5 MG PO TABS: 5.0000 mg | ORAL_TABLET | Freq: Three times a day (TID) | ORAL | Status: DC | PRN

## 2023-12-21 MED ORDER — CEFAZOLIN SODIUM-DEXTROSE 2-4 GM/100ML-% IV SOLN
INTRAVENOUS | Status: AC
  Filled 2023-12-21: qty 100

## 2023-12-21 MED ORDER — VANCOMYCIN HCL 1000 MG IV SOLR
INTRAVENOUS | Status: AC
  Filled 2023-12-21: qty 20

## 2023-12-21 MED ORDER — LACTATED RINGERS IV SOLN: INTRAVENOUS | Status: DC | PRN

## 2023-12-21 MED ORDER — EPHEDRINE SULFATE-NACL 50-0.9 MG/10ML-% IV SOSY
PREFILLED_SYRINGE | INTRAVENOUS | Status: DC | PRN
  Administered 2023-12-21 (×3): 5 mg via INTRAVENOUS

## 2023-12-21 MED ORDER — ACETAMINOPHEN 500 MG PO TABS: 1000.0000 mg | ORAL_TABLET | Freq: Four times a day (QID) | ORAL | Status: DC | PRN

## 2023-12-21 MED ORDER — 0.9 % SODIUM CHLORIDE (POUR BTL) OPTIME
TOPICAL | Status: DC | PRN
  Administered 2023-12-21: 1000 mL

## 2023-12-21 MED ORDER — CEFAZOLIN SODIUM-DEXTROSE 2-4 GM/100ML-% IV SOLN
2.0000 g | INTRAVENOUS | Status: AC
  Administered 2023-12-21: 2 g via INTRAVENOUS

## 2023-12-21 MED ORDER — METOCLOPRAMIDE HCL 5 MG/ML IJ SOLN: 5.0000 mg | Freq: Three times a day (TID) | INTRAMUSCULAR | Status: DC | PRN

## 2023-12-21 MED ORDER — TRANEXAMIC ACID-NACL 1000-0.7 MG/100ML-% IV SOLN
INTRAVENOUS | Status: AC
  Filled 2023-12-21: qty 100

## 2023-12-21 MED ORDER — DOCUSATE SODIUM 100 MG PO CAPS
200.0000 mg | ORAL_CAPSULE | Freq: Every day | ORAL | Status: DC
  Administered 2023-12-22: 200 mg via ORAL
  Filled 2023-12-21: qty 2

## 2023-12-21 MED ORDER — POLYETHYLENE GLYCOL 3350 17 G PO PACK
17.0000 g | PACK | Freq: Every day | ORAL | Status: DC
  Administered 2023-12-22: 17 g via ORAL
  Filled 2023-12-21: qty 1

## 2023-12-21 MED ORDER — LIDOCAINE 2% (20 MG/ML) 5 ML SYRINGE
INTRAMUSCULAR | Status: DC | PRN
  Administered 2023-12-21: 40 mg via INTRAVENOUS

## 2023-12-21 MED ORDER — LACTATED RINGERS IV SOLN: INTRAVENOUS | Status: DC

## 2023-12-21 MED ORDER — DEXAMETHASONE SODIUM PHOSPHATE 10 MG/ML IJ SOLN
INTRAMUSCULAR | Status: DC | PRN
  Administered 2023-12-21: 4 mg via INTRAVENOUS

## 2023-12-21 MED ORDER — PROPOFOL 10 MG/ML IV BOLUS
INTRAVENOUS | Status: DC | PRN
Start: 2023-12-21 — End: 2023-12-21
  Administered 2023-12-21: 50 mg via INTRAVENOUS

## 2023-12-21 MED ORDER — ADULT MULTIVITAMIN W/MINERALS CH
1.0000 | ORAL_TABLET | Freq: Every day | ORAL | Status: DC
  Administered 2023-12-22 – 2023-12-25 (×3): 1 via ORAL
  Filled 2023-12-21 (×4): qty 1

## 2023-12-21 MED ORDER — ACETAMINOPHEN 325 MG PO TABS: 325.0000 mg | ORAL_TABLET | Freq: Four times a day (QID) | ORAL | Status: DC | PRN

## 2023-12-21 MED ORDER — ORAL CARE MOUTH RINSE: 15.0000 mL | Freq: Once | OROMUCOSAL | Status: AC

## 2023-12-21 MED ORDER — APIXABAN 2.5 MG PO TABS
2.5000 mg | ORAL_TABLET | Freq: Two times a day (BID) | ORAL | Status: DC
  Administered 2023-12-22 – 2023-12-26 (×9): 2.5 mg via ORAL
  Filled 2023-12-21 (×9): qty 1

## 2023-12-21 MED ORDER — CHLORHEXIDINE GLUCONATE 0.12 % MT SOLN: 15.0000 mL | Freq: Once | OROMUCOSAL | Status: AC

## 2023-12-21 MED ORDER — MORPHINE SULFATE (PF) 2 MG/ML IV SOLN
0.5000 mg | INTRAVENOUS | Status: DC | PRN
  Administered 2023-12-22: 0.5 mg via INTRAVENOUS
  Administered 2023-12-23: 1 mg via INTRAVENOUS
  Filled 2023-12-21 (×2): qty 1

## 2023-12-21 MED ORDER — PHENYLEPHRINE 80 MCG/ML (10ML) SYRINGE FOR IV PUSH (FOR BLOOD PRESSURE SUPPORT)
PREFILLED_SYRINGE | INTRAVENOUS | Status: AC
  Filled 2023-12-21: qty 10

## 2023-12-21 MED ORDER — CEFAZOLIN SODIUM-DEXTROSE 2-4 GM/100ML-% IV SOLN
2.0000 g | Freq: Four times a day (QID) | INTRAVENOUS | Status: AC
  Administered 2023-12-21 (×2): 2 g via INTRAVENOUS
  Filled 2023-12-21 (×2): qty 100

## 2023-12-21 MED ORDER — VANCOMYCIN HCL 1000 MG IV SOLR
INTRAVENOUS | Status: DC | PRN
  Administered 2023-12-21: 1000 mg via TOPICAL

## 2023-12-21 MED ORDER — PHENYLEPHRINE HCL-NACL 20-0.9 MG/250ML-% IV SOLN
INTRAVENOUS | Status: DC | PRN
  Administered 2023-12-21: 25 ug/min via INTRAVENOUS

## 2023-12-21 MED ORDER — DOCUSATE SODIUM 100 MG PO CAPS
100.0000 mg | ORAL_CAPSULE | Freq: Two times a day (BID) | ORAL | Status: DC
  Administered 2023-12-21 – 2023-12-26 (×10): 100 mg via ORAL
  Filled 2023-12-21 (×10): qty 1

## 2023-12-21 MED ORDER — POTASSIUM CHLORIDE 10 MEQ/100ML IV SOLN
10.0000 meq | INTRAVENOUS | Status: AC
  Administered 2023-12-21 (×2): 10 meq via INTRAVENOUS
  Filled 2023-12-21: qty 100

## 2023-12-21 MED ORDER — LEVOTHYROXINE SODIUM 25 MCG PO TABS
125.0000 ug | ORAL_TABLET | Freq: Every day | ORAL | Status: DC
  Administered 2023-12-21 – 2023-12-26 (×6): 125 ug via ORAL
  Filled 2023-12-21 (×6): qty 1

## 2023-12-21 MED ORDER — HYDROCODONE-ACETAMINOPHEN 5-325 MG PO TABS
1.0000 | ORAL_TABLET | Freq: Four times a day (QID) | ORAL | Status: DC | PRN
  Administered 2023-12-21 (×2): 1 via ORAL
  Filled 2023-12-21 (×3): qty 1

## 2023-12-21 MED ORDER — TRANEXAMIC ACID-NACL 1000-0.7 MG/100ML-% IV SOLN
1000.0000 mg | Freq: Once | INTRAVENOUS | Status: AC
  Administered 2023-12-21: 1000 mg via INTRAVENOUS
  Filled 2023-12-21: qty 100

## 2023-12-21 MED ORDER — HYDROCODONE-ACETAMINOPHEN 7.5-325 MG PO TABS
ORAL_TABLET | ORAL | Status: AC
  Filled 2023-12-21: qty 1

## 2023-12-21 MED ORDER — MUPIROCIN 2 % EX OINT
1.0000 | TOPICAL_OINTMENT | Freq: Two times a day (BID) | CUTANEOUS | Status: DC
  Administered 2023-12-21 – 2023-12-25 (×5): 1 via NASAL
  Filled 2023-12-21 (×2): qty 22

## 2023-12-21 MED ORDER — ONDANSETRON HCL 4 MG/2ML IJ SOLN
INTRAMUSCULAR | Status: DC | PRN
  Administered 2023-12-21: 4 mg via INTRAVENOUS

## 2023-12-21 MED ORDER — TRANEXAMIC ACID-NACL 1000-0.7 MG/100ML-% IV SOLN
1000.0000 mg | INTRAVENOUS | Status: AC
  Administered 2023-12-21: 1000 mg via INTRAVENOUS

## 2023-12-21 MED ORDER — FESOTERODINE FUMARATE ER 4 MG PO TB24
4.0000 mg | ORAL_TABLET | Freq: Every day | ORAL | Status: DC
  Filled 2023-12-21: qty 1

## 2023-12-21 SURGICAL SUPPLY — 44 items
BAG COUNTER SPONGE SURGICOUNT (BAG) IMPLANT
BIT DRILL 4.9 CANNULATED (BIT) ×1 IMPLANT
BIT DRILL CANN QC 4.9 LRG (BIT) IMPLANT
COVER SURGICAL LIGHT HANDLE (MISCELLANEOUS) ×1 IMPLANT
DRAPE INCISE IOBAN 66X45 STRL (DRAPES) ×1 IMPLANT
DRESSING MEPILEX FLEX 4X4 (GAUZE/BANDAGES/DRESSINGS) ×1 IMPLANT
DRSG MEPILEX FLEX 4X4 (GAUZE/BANDAGES/DRESSINGS) IMPLANT
DRSG XEROFORM 1X8 (GAUZE/BANDAGES/DRESSINGS) IMPLANT
DURAPREP 26ML APPLICATOR (WOUND CARE) ×1 IMPLANT
ELECT REM PT RETURN 9FT ADLT (ELECTROSURGICAL) ×1 IMPLANT
ELECTRODE REM PT RTRN 9FT ADLT (ELECTROSURGICAL) ×1 IMPLANT
FACESHIELD WRAPAROUND (MASK) IMPLANT
FACESHIELD WRAPAROUND OR TEAM (MASK) IMPLANT
GAUZE PAD ABD 8X10 STRL (GAUZE/BANDAGES/DRESSINGS) IMPLANT
GAUZE SPONGE 4X4 12PLY STRL (GAUZE/BANDAGES/DRESSINGS) IMPLANT
GLOVE BIO SURGEON STRL SZ 6.5 (GLOVE) ×1 IMPLANT
GLOVE ECLIPSE 8.0 STRL XLNG CF (GLOVE) ×1 IMPLANT
GLOVE INDICATOR 6.5 STRL GRN (GLOVE) ×1 IMPLANT
GLOVE INDICATOR 8.0 STRL GRN (GLOVE) ×1 IMPLANT
GLOVE SS N UNI LF 8.0 STRL (GLOVE) ×1 IMPLANT
GOWN STRL REUS W/ TWL LRG LVL3 (GOWN DISPOSABLE) ×2 IMPLANT
GUIDEWIRE ASNIS 3.2 NONCAL (WIRE) IMPLANT
KIT TURNOVER KIT B (KITS) ×1 IMPLANT
MANIFOLD NEPTUNE II (INSTRUMENTS) ×1 IMPLANT
NDL HYPO 22X1.5 SAFETY MO (MISCELLANEOUS) IMPLANT
NEEDLE HYPO 22X1.5 SAFETY MO (MISCELLANEOUS) ×1 IMPLANT
NS IRRIG 1000ML POUR BTL (IV SOLUTION) ×1 IMPLANT
PACK GENERAL/GYN (CUSTOM PROCEDURE TRAY) ×1 IMPLANT
PACK UNIVERSAL I (CUSTOM PROCEDURE TRAY) IMPLANT
PAD ARMBOARD 7.5X6 YLW CONV (MISCELLANEOUS) ×2 IMPLANT
SCREW ASNIS 6.5X115MM (Screw) IMPLANT
SCREW ASNIS 85MM (Screw) IMPLANT
SCREW CANN ASNIS 6.5X110 (Screw) IMPLANT
SCREW CANN ASNIS 6.5X95 (Screw) IMPLANT
SCREW CANN FT ASNIS 8X115 NS (Screw) IMPLANT
STAPLER VISISTAT 35W (STAPLE) IMPLANT
SUT ETHILON 2 0 FS 18 (SUTURE) IMPLANT
SUT VIC AB 2-0 CT1 TAPERPNT 27 (SUTURE) ×1 IMPLANT
SUT VIC AB 3-0 SH 27X BRD (SUTURE) IMPLANT
SYR 10ML LL (SYRINGE) IMPLANT
TOWEL GREEN STERILE (TOWEL DISPOSABLE) ×1 IMPLANT
TOWEL GREEN STERILE FF (TOWEL DISPOSABLE) ×1 IMPLANT
WASHER SCREW ASNIS F/6.5/8 0.8 (Washer) IMPLANT
WATER STERILE IRR 1000ML POUR (IV SOLUTION) ×1 IMPLANT

## 2023-12-21 NOTE — Interval H&P Note (Signed)
 All questions

## 2023-12-21 NOTE — H&P (Signed)
 History and Physical    [name] FMW:[id number] DOB: [birth year] DOA: 12/20/2023  DOS: the patient was seen and examined on 12/20/2023  PCP: Ransom Other, MD   Patient coming from: Home  I have personally briefly reviewed patient's old medical records in [facility]  HPI: 88 year old female with a history of paroxysmal A-fib, chronic diastolic heart failure, history of DVT on Coumadin, hypertension, hypothyroidism, presents to the ER today after mechanical fall at home.  She states that she was try to place something into invigorated.  Lost her grip on refrigerator and fell onto her left side.  Had immediate pain.  She was able to crawl and find her cell phone.  She called her neighbor.  EMS was called.  Patient brought to the ER.  Arrival temp 90.0 heart rate 84 blood pressure 133/77.  Blood pressure dropped to 81/62 which recovered with liter bolus of IV fluids.  Patient discovered to have a acute left femoral neck fracture.  [facility]  hospitalist consulted.  Orthopedics consulted.  Trauma surgery consulted.  Significant Events: Admitted 12/20/2023 left hip fracture   Significant Labs: White count 20, hemoglobin 13.3, platelets of 170 Sodium 138, potassium 3.3, chloride 98, bicarb 25, BUN 26, creatinine 0.97, glucose 141 AST of 45, ALT 56, total bili of 0.9 total protein 6.1, BUN 3.4 UA negative  Significant Imaging Studies: CT of the head demonstrates no acute intracranial normalities. CT C-spine negative for acute fracture of the cervical spine. CT chest abdomen pelvis demonstrated no acute intrathoracic, intra-abdominal or intrapelvic traumatic injury.  She does have a acutely impacted left femoral neck fracture.  Patient has a known 4.6 stable ascending thoracic aneurysm.  Antibiotic Therapy: Anti-infectives (From admission, onward)    None       Procedures:   Consultants: Trauma service orthopedics    ED Course: CT chest/abd/pelvis shows left femoral  neck fracture. Hip XR were negative for fracture  Review of Systems:  Review of Systems  Constitutional: Negative.   HENT: Negative.    Eyes: Negative.   Respiratory: Negative.    Cardiovascular: Negative.   Gastrointestinal: Negative.   Genitourinary: Negative.   Musculoskeletal:        Left hip pain Right lower leg pain  Skin: Negative.   Neurological: Negative.   Endo/Heme/Allergies: Negative.   Psychiatric/Behavioral: Negative.    All other systems reviewed and are negative.   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/20/2011   R breast DCIS, ER/PR +   Cataract 3 and 10/92   bilateral   Chronic diastolic CHF (congestive heart failure) (HCC)    Compression fracture of fourth lumbar vertebra (HCC)    DVT (deep venous thrombosis) (HCC) 08/2011   LL extremity    Fibromyalgia    Fracture lumbar vertebra-closed (HCC)    Fracture of thoracic vertebra, closed (HCC)    GERD (gastroesophageal reflux disease)    H/O hiatal hernia    History of blood clots    History of radiation therapy 01/2012   R breast   Hypercholesteremia    Hypertension    DR JINNY BUDGE   Hypothyroidism    Mitral regurgitation    Mitral valve prolapse    Osteoporosis    PAF (paroxysmal atrial fibrillation) (HCC)    a. dx 11/2016.   Pulmonary hypertension (HCC)    Rib fractures    Thoracic ascending aortic aneurysm (HCC)    a.  followed by Dr. Fleeta Ochoa.   Tricuspid regurgitation     Past Surgical History:  Procedure Laterality Date   ANTERIOR CERVICAL DECOMP/DISCECTOMY FUSION N/A 09/09/2015   Procedure: Anterior Cervical Decompression and Fusion Cervical seven-Thorasic one ;  Surgeon: Fairy Levels, MD;  Location: [facility];  Service: Neurosurgery;  Laterality: N/A;   APPENDECTOMY  1940   BREAST SURGERY     CATARACT EXTRACTION  1992   EYE SURGERY  1940, 1956   HERNIA REPAIR  10/16/2006   [facility] - Dr Gladis   KYPHOPLASTY N/A 01/26/2013    Procedure: KYPHOPLASTY;  Surgeon: Victory Gens, MD;  Location: [facility];  Service: Neurosurgery;  Laterality: N/A;  T11 and L1   KYPHOPLASTY N/A 06/21/2017   Procedure: Lumbar four Kyphoplasty;  Surgeon: Levels Fairy, MD;  Location: [facility] OR;  Service: Neurosurgery;  Laterality: N/A;   MASTECTOMY, PARTIAL  10/17/2011   Procedure: MASTECTOMY PARTIAL;  Surgeon: Sherlean JINNY Laughter, MD;  Location: [facility] OR;  Service: General;  Laterality: Right;  needle guided   TEE WITHOUT CARDIOVERSION N/A 12/04/2016   Procedure: TRANSESOPHAGEAL ECHOCARDIOGRAM (TEE);  Surgeon: Vinie JAYSON Maxcy, MD;  Location: [facility] ENDOSCOPY;  Service: Cardiovascular;  Laterality: N/A;   TONSILLECTOMY  1944     reports that she has never smoked. She has never used smokeless tobacco. She reports that she does not drink alcohol and does not use drugs.  Allergies  Allergen Reactions   Contrast Media [Iodinated Contrast Media] Shortness Of Breath   Iohexol  Shortness Of Breath   Mucinex  [Guaifenesin  Er] Other (See Comments)    DIZZINESS   Zanaflex [Tizanidine Hcl] Anxiety and Other (See Comments)    Dizziness   Penicillins Rash    Has patient had a PCN reaction causing immediate rash, facial/tongue/throat swelling, SOB or lightheadedness with hypotension: No Has patient had a PCN reaction causing severe rash involving mucus membranes or skin necrosis: No Has patient had a PCN reaction that required hospitalization No Has patient had a PCN reaction occurring within the last 10 years: No If all of the above answers are NO, then may proceed with Cephalosporin use.    Robaxin  [Methocarbamol ] Diarrhea    Family History  Problem Relation Age of Onset   Heart disease Mother    Heart disease Maternal Uncle    Heart disease Maternal Grandfather     Prior to Admission medications   Medication Sig Start Date End Date Taking? Authorizing Provider  amiodarone  (PACERONE ) 200 MG tablet Take 1 tablet (200 mg total) by mouth daily. 09/03/23   Yes Vannie Reche RAMAN, NP  apixaban  (ELIQUIS ) 2.5 MG TABS tablet Take 1 tablet (2.5 mg total) by mouth 2 (two) times daily. 09/03/23  Yes Vannie Reche RAMAN, NP  Ascorbic Acid (VITA-C PO) Take 1,000 mg by mouth daily.   Yes [provider]  calcium  citrate-vitamin D  (CITRACAL+D) 315-200 MG-UNIT per tablet Take 1 tablet by mouth 2 (two) times daily. 01/28/13  Yes Signa Rush, MD  diltiazem  (CARDIZEM  CD) 120 MG 24 hr capsule Take 1 capsule (120 mg total) by mouth daily. 03/08/23 12/21/23 Yes Walker, Caitlin S, NP  Docusate Calcium  (STOOL SOFTENER PO) Take 1 tablet by mouth daily.   Yes [provider]  estradiol (ESTRACE) 0.1 MG/GM vaginal cream Place 1 Applicatorful vaginally 2 (two) times a week.   Yes [provider]  furosemide  (LASIX ) 20 MG tablet Take 20 mg by mouth daily.   Yes [provider]  HYDROcodone -acetaminophen  (NORCO/VICODIN) 5-325 MG tablet Take  1-2 tablets by mouth every 6 (six) hours as needed (mild pain). Patient taking differently: Take 1-2 tablets by mouth 3 (three) times daily as needed for moderate pain (pain score 4-6). 12/03/22  Yes Fairy Frames, MD  iron  polysaccharides (NIFEREX) 150 MG capsule Take 1 capsule (150 mg total) by mouth 3 (three) times a week. M, W, F 12/03/22  Yes Fairy Frames, MD  levothyroxine  (SYNTHROID ) 125 MCG tablet Take 1 tablet (125 mcg total) by mouth daily before breakfast. 01/02/23  Yes Hobart Powell BRAVO, MD  Meclizine  HCl (BONINE PO) Take by mouth as needed.   Yes [provider]  Multiple Vitamin (MULTIVITAMIN) tablet Take 1 tablet by mouth daily with breakfast.   Yes [provider]  omeprazole  (PRILOSEC ) 20 MG capsule Take 1 capsule (20 mg total) by mouth daily. 02/15/23  Yes Wyn Jackee VEAR Mickey., NP  OVER THE COUNTER MEDICATION Take 1,000 mg by mouth daily. D-Mannose 500 mg   Yes [provider]  polyethylene glycol (MIRALAX  / GLYCOLAX ) 17 g packet Take 17 g by mouth daily.   Yes  [provider]  pravastatin  (PRAVACHOL ) 20 MG tablet Take 1 tablet (20 mg total) by mouth daily with supper. 12/03/22  Yes Fairy Frames, MD  risedronate  (ACTONEL ) 150 MG tablet Take 150 mg by mouth every 30 (thirty) days. with water on empty stomach, nothing by mouth or lie down for next 30 minutes.   Yes [provider]  solifenacin  (VESICARE ) 10 MG tablet Take 1 tablet (10 mg total) by mouth daily. Patient taking differently: Take 5 mg by mouth daily. 12/03/22  Yes Fairy Frames, MD  spironolactone  (ALDACTONE ) 25 MG tablet Take 1/2 tablet (12.5 mg total) by mouth daily. 07/16/23  Yes Lonni Slain, MD  senna-docusate (SENOKOT-S) 8.6-50 MG tablet Take 1 tablet by mouth 2 (two) times daily. Patient not taking: Reported on 12/21/2023 12/03/22   Fairy Frames, MD    Physical Exam: Vitals:   12/20/23 2315 12/20/23 2330 12/20/23 2345 12/21/23 0000  BP: (!) 88/65 96/71 109/78 110/78  Pulse: 70 72 74 76  Resp:   17   Temp:   98.1 F (36.7 C)   TempSrc:   Oral   SpO2: 93% 94% 97% 94%  Weight:      Height:        Physical Exam Vitals and nursing note reviewed.  Constitutional:      Comments: Frail, elderly female  HENT:     Head: Normocephalic and atraumatic.     Nose: Nose normal.  Eyes:     General: No scleral icterus. Cardiovascular:     Heart sounds: Murmur heard.  Pulmonary:     Effort: Pulmonary effort is normal.     Breath sounds: Normal breath sounds.  Abdominal:     General: Abdomen is flat. Bowel sounds are normal. There is no distension.     Palpations: Abdomen is soft.  Skin:    Capillary Refill: Capillary refill takes less than 2 seconds.     Comments: Right lower leg hematoma  Neurological:     General: No focal deficit present.     Mental Status: She is oriented to person, place, and time.    Labs on Admission: I have personally reviewed following labs and imaging studies  CBC: Recent Labs  Lab 12/20/23 2220  WBC 20.0*   NEUTROABS 17.2*  HGB 13.3  HCT 37.9  MCV 89.4  PLT 170   Basic Metabolic Panel: Recent Labs  Lab 12/20/23 2220  NA 138  K 3.3*  CL 98  CO2 25  GLUCOSE 141*  BUN 26*  CREATININE 0.97  CALCIUM  9.2   GFR: Estimated Creatinine Clearance: 28.2 mL/min (by C-G formula based on SCr of 0.97 mg/dL). Liver Function Tests: Recent Labs  Lab 12/20/23 2220  AST 45*  ALT 56*  ALKPHOS 47  BILITOT 0.9  PROT 6.1*  ALBUMIN  3.4*   BNP (last 3 results) Recent Labs    01/29/23 0948  BNP 183.2*   Urine analysis:    Component Value Date/Time   COLORURINE YELLOW 12/21/2023 0015   APPEARANCEUR CLEAR 12/21/2023 0015   LABSPEC 1.008 12/21/2023 0015   PHURINE 7.0 12/21/2023 0015   GLUCOSEU NEGATIVE 12/21/2023 0015   HGBUR NEGATIVE 12/21/2023 0015   BILIRUBINUR NEGATIVE 12/21/2023 0015   KETONESUR NEGATIVE 12/21/2023 0015   PROTEINUR NEGATIVE 12/21/2023 0015   UROBILINOGEN 0.2 09/04/2015 1404   NITRITE NEGATIVE 12/21/2023 0015   LEUKOCYTESUR NEGATIVE 12/21/2023 0015    Radiological Exams on Admission: I have personally reviewed images CT T-SPINE NO CHARGE Result Date: 12/20/2023 CLINICAL DATA:  fall EXAM: CT Thoracic and Lumbar spine without contrast TECHNIQUE: Multiplanar CT images of the thoracic and lumbar spine were reconstructed from contemporary CT of the Chest, Abdomen, and Pelvis. RADIATION DOSE REDUCTION: This exam was performed according to the departmental dose-optimization program which includes automated exposure control, adjustment of the mA and/or kV according to patient size and/or use of iterative reconstruction technique. CONTRAST:  None or No additional COMPARISON:  None Available. FINDINGS: CT THORACIC SPINE FINDINGS Alignment: Normal. Vertebrae: Diffusely decreased bone density. C7-T1 anterior cervical discectomy and fusion surgical hardware. Interval worsening of T10 superior endplate compression fracture with 40% vertebral body height. T8, T9, T11, T12 chronic stable  fractures. Kyphoplasty of T11. No acute fracture or focal pathologic process. Paraspinal and other soft tissues: Negative. Disc levels: Multilevel severe intervertebral disc space narrowing. CT LUMBAR SPINE FINDINGS Segmentation: 5 lumbar type vertebrae. Alignment: Normal. Vertebrae: Diffusely decreased bone density. Chronic stable L1, L2, L3, L4 fractures status post L1 and L4 kyphoplasty. No acute fracture or focal pathologic process. Paraspinal and other soft tissues: Negative. Disc levels: Multilevel intervertebral disc space vacuum phenomenon. IMPRESSION: 1. Interval worsening of T10 superior endplate compression fracture with 40% vertebral body height. Correlate with point tenderness to palpation for an acute component. 2. Multilevel chronic stable thoracolumbar fractures. 3. Diffusely decreased bone density. Electronically Signed   By: Morgane  Naveau M.D.   On: 12/20/2023 22:43   CT L-SPINE NO CHARGE Result Date: 12/20/2023 CLINICAL DATA:  fall EXAM: CT Thoracic and Lumbar spine without contrast TECHNIQUE: Multiplanar CT images of the thoracic and lumbar spine were reconstructed from contemporary CT of the Chest, Abdomen, and Pelvis. RADIATION DOSE REDUCTION: This exam was performed according to the departmental dose-optimization program which includes automated exposure control, adjustment of the mA and/or kV according to patient size and/or use of iterative reconstruction technique. CONTRAST:  None or No additional COMPARISON:  None Available. FINDINGS: CT THORACIC SPINE FINDINGS Alignment: Normal. Vertebrae: Diffusely decreased bone density. C7-T1 anterior cervical discectomy and fusion surgical hardware. Interval worsening of T10 superior endplate compression fracture with 40% vertebral body height. T8, T9, T11, T12 chronic stable fractures. Kyphoplasty of T11. No acute fracture or focal pathologic process. Paraspinal and other soft tissues: Negative. Disc levels: Multilevel severe intervertebral disc  space narrowing. CT LUMBAR SPINE FINDINGS Segmentation: 5 lumbar type vertebrae. Alignment: Normal. Vertebrae: Diffusely decreased bone density. Chronic stable L1, L2, L3, L4 fractures  status post L1 and L4 kyphoplasty. No acute fracture or focal pathologic process. Paraspinal and other soft tissues: Negative. Disc levels: Multilevel intervertebral disc space vacuum phenomenon. IMPRESSION: 1. Interval worsening of T10 superior endplate compression fracture with 40% vertebral body height. Correlate with point tenderness to palpation for an acute component. 2. Multilevel chronic stable thoracolumbar fractures. 3. Diffusely decreased bone density. Electronically Signed   By: Morgane  Naveau M.D.   On: 12/20/2023 22:43   CT CHEST ABDOMEN PELVIS WO CONTRAST Result Date: 12/20/2023 CLINICAL DATA:  Polytrauma, blunt fall, back pain, hlp pain. hit head. eliquis  use. EXAM: CT CHEST, ABDOMEN AND PELVIS WITHOUT CONTRAST TECHNIQUE: Multidetector CT imaging of the chest, abdomen and pelvis was performed following the standard protocol without IV contrast. RADIATION DOSE REDUCTION: This exam was performed according to the departmental dose-optimization program which includes automated exposure control, adjustment of the mA and/or kV according to patient size and/or use of iterative reconstruction technique. COMPARISON:  CT chest abdomen pelvis 11/22/2022, CT pelvis 06/08/2011, CT abdomen pelvis 05/30/2017 FINDINGS: CHEST: Cardiovascular: Stable aneurysmal ascending thoracic aorta measuring up to 4.6 cm. The heart is normal in size. No significant pericardial effusion. Severe atherosclerotic plaque. Mitral annular calcification. Coronary artery calcification. Lungs/Pleura: Left lower lobe passive atelectasis. No focal consolidation. No pulmonary nodule. No pulmonary mass. No pulmonary contusion or laceration. No pneumatocele formation. No pleural effusion. No pneumothorax. No hemothorax. Mediastinum/Nodes: No pneumomediastinum.  The central airways are patent. The esophagus is unremarkable. Stable large hiatal hernia containing the entire stomach. The thyroid  is unremarkable. Limited evaluation for hilar lymphadenopathy on this noncontrast study. No mediastinal or axillary lymphadenopathy. Musculoskeletal/Chest wall No chest wall mass. Diffusely decreased bone density. Old healed right rib fractures. No acute rib or sternal fracture. Please see separately dictated CT thoracolumbar spine 12/20/2023. ABDOMEN / PELVIS: Hepatobiliary: Not enlarged. Hyperdense hepatic parenchyma.  No focal lesion. The gallbladder is otherwise unremarkable with no radio-opaque gallstones. No biliary ductal dilatation. Pancreas: Normal pancreatic contour. No main pancreatic duct dilatation. Spleen: Not enlarged. No focal lesion. Adrenals/Urinary Tract: No nodularity bilaterally. No hydroureteronephrosis. No nephroureterolithiasis. No contour deforming renal mass. The urinary bladder is unremarkable. Stomach/Bowel: No small or large bowel wall thickening or dilatation. Stool throughout the majority of the colon. The appendix is unremarkable. Vasculature/Lymphatic: Moderate atherosclerotic plaque. No abdominal aorta or iliac aneurysm. No abdominal, pelvic, inguinal lymphadenopathy. Reproductive: Normal. Other: No simple free fluid ascites. No pneumoperitoneum. No mesenteric hematoma identified. No organized fluid collection. Musculoskeletal: No significant soft tissue hematoma. Persistent densely sclerotic right iliac bone lesion. Diffusely decreased bone density. Acute impacted left femoral neck fracture. Please see separately dictated CT thoracolumbar spine 12/20/2023. Ports and Devices: None. IMPRESSION: 1. No acute intrathoracic, intra-abdominal, intrapelvic traumatic injury with limited evaluation on this noncontrast study. 2. Please see separately dictated CT thoracolumbar spine 12/20/2023. 3. Stable aneurysmal ascending thoracic aorta (4.6 cm). Ascending  thoracic aortic aneurysm. Recommend semi-annual imaging followup by CTA or MRA and referral to cardiothoracic surgery if not already obtained. This recommendation follows 2010 ACCF/AHA/AATS/ACR/ASA/SCA/SCAI/SIR/STS/SVM Guidelines for the Diagnosis and Management of Patients With Thoracic Aortic Disease. Circulation. 2010; 121: [id number]. Aortic aneurysm NOS (ICD10-I71.9). Aortic aneurysm NOS (ICD10-I71.9). 4. Aortic Atherosclerosis (ICD10-I70.0) including coronary artery and mitral annular calcification. 5. Acute impacted left femoral neck fracture. 6. Constipation. Electronically Signed   By: Morgane  Naveau M.D.   On: 12/20/2023 22:29   DG Ankle Complete Right Result Date: 12/20/2023 CLINICAL DATA:  Status post fall. EXAM: RIGHT ANKLE - COMPLETE 3+ VIEW COMPARISON:  None Available.  FINDINGS: There is no evidence of fracture, dislocation, or joint effusion. There is no evidence of arthropathy or other focal bone abnormality. Moderate severity soft tissue swelling is seen along the medial aspect of the distal right tibial shaft. IMPRESSION: Medial soft tissue swelling without evidence of acute fracture or dislocation. Electronically Signed   By: Suzen Dials M.D.   On: 12/20/2023 22:21   DG Tibia/Fibula Right Result Date: 12/20/2023 CLINICAL DATA:  Status post fall. EXAM: RIGHT TIBIA AND FIBULA - 2 VIEW COMPARISON:  None Available. FINDINGS: There is no evidence of fracture or other focal bone lesions. Mild to moderate severity focal soft tissue swelling is seen along the medial aspect of the distal right tibial shaft. IMPRESSION: Focal soft tissue swelling along the medial aspect of the distal right tibial shaft. Electronically Signed   By: Suzen Dials M.D.   On: 12/20/2023 22:19   DG Hip [place] or [place] Pelvis 1 View Left Result Date: 12/20/2023 CLINICAL DATA:  Status post fall. EXAM: DG HIP (WITH OR WITHOUT PELVIS) 1V PORT LEFT COMPARISON:  None Available. FINDINGS: There is no evidence of an  acute hip fracture or dislocation. Mild degenerative changes are seen in the form of joint space narrowing and acetabular sclerosis. IMPRESSION: Mild degenerative changes without evidence of an acute fracture or dislocation. Electronically Signed   By: Suzen Dials M.D.   On: 12/20/2023 22:18   CT HEAD WO CONTRAST ( ) Result Date: 12/20/2023 CLINICAL DATA:  Polytrauma, blunt fall; Polytrauma, blunt EXAM: CT HEAD WITHOUT CONTRAST CT CERVICAL SPINE WITHOUT CONTRAST TECHNIQUE: Multidetector CT imaging of the head and cervical spine was performed following the standard protocol without intravenous contrast. Multiplanar CT image reconstructions of the cervical spine were also generated. RADIATION DOSE REDUCTION: This exam was performed according to the departmental dose-optimization program which includes automated exposure control, adjustment of the mA and/or kV according to patient size and/or use of iterative reconstruction technique. COMPARISON:  None Available. FINDINGS: CT HEAD FINDINGS Brain: Cerebral ventricle sizes are concordant with the degree of cerebral volume loss. Patchy and confluent areas of decreased attenuation are noted throughout the deep and periventricular white matter of the cerebral hemispheres bilaterally, compatible with chronic microvascular ischemic disease. Scattered cerebral parenchyma calcifications. No evidence of large-territorial acute infarction. No parenchymal hemorrhage. No mass lesion. No extra-axial collection. No mass effect or midline shift. No hydrocephalus. Basilar cisterns are patent. Vascular: No hyperdense vessel. Skull: No acute fracture or focal lesion. Sinuses/Orbits: Almost complete opacification of left maxillary sinus. Otherwise paranasal sinuses and mastoid air cells are clear. Bilateral lens replacement. Otherwise the orbits are unremarkable. Other: None. CT CERVICAL SPINE FINDINGS Alignment: Grade 1 anterolisthesis of C4 on C5. Skull base and vertebrae:  C7-T1 anterior cervical discectomy and fusion surgical hardware. Multilevel moderate severe degenerative changes of the spine. No associated severe osseous central canal stenosis. Associated moderate to severe left C5-C6 osseous neural foraminal stenosis. No acute fracture. No aggressive appearing focal osseous lesion or focal pathologic process. Soft tissues and spinal canal: No prevertebral fluid or swelling. No visible canal hematoma. Upper chest: Unremarkable. Other: Atherosclerotic plaque. IMPRESSION: 1. No acute intracranial abnormality. 2. No acute displaced fracture or traumatic listhesis of the cervical spine. 3. Scattered cerebral parenchyma calcifications. Findings likely sequelae of prior infection/inflammation. 4.  Aortic Atherosclerosis (ICD10-I70.0). Electronically Signed   By: Morgane  Naveau M.D.   On: 12/20/2023 22:05   CT Cervical Spine Wo Contrast Result Date: 12/20/2023 CLINICAL DATA:  Polytrauma, blunt fall; Polytrauma, blunt  EXAM: CT HEAD WITHOUT CONTRAST CT CERVICAL SPINE WITHOUT CONTRAST TECHNIQUE: Multidetector CT imaging of the head and cervical spine was performed following the standard protocol without intravenous contrast. Multiplanar CT image reconstructions of the cervical spine were also generated. RADIATION DOSE REDUCTION: This exam was performed according to the departmental dose-optimization program which includes automated exposure control, adjustment of the mA and/or kV according to patient size and/or use of iterative reconstruction technique. COMPARISON:  None Available. FINDINGS: CT HEAD FINDINGS Brain: Cerebral ventricle sizes are concordant with the degree of cerebral volume loss. Patchy and confluent areas of decreased attenuation are noted throughout the deep and periventricular white matter of the cerebral hemispheres bilaterally, compatible with chronic microvascular ischemic disease. Scattered cerebral parenchyma calcifications. No evidence of large-territorial acute  infarction. No parenchymal hemorrhage. No mass lesion. No extra-axial collection. No mass effect or midline shift. No hydrocephalus. Basilar cisterns are patent. Vascular: No hyperdense vessel. Skull: No acute fracture or focal lesion. Sinuses/Orbits: Almost complete opacification of left maxillary sinus. Otherwise paranasal sinuses and mastoid air cells are clear. Bilateral lens replacement. Otherwise the orbits are unremarkable. Other: None. CT CERVICAL SPINE FINDINGS Alignment: Grade 1 anterolisthesis of C4 on C5. Skull base and vertebrae: C7-T1 anterior cervical discectomy and fusion surgical hardware. Multilevel moderate severe degenerative changes of the spine. No associated severe osseous central canal stenosis. Associated moderate to severe left C5-C6 osseous neural foraminal stenosis. No acute fracture. No aggressive appearing focal osseous lesion or focal pathologic process. Soft tissues and spinal canal: No prevertebral fluid or swelling. No visible canal hematoma. Upper chest: Unremarkable. Other: Atherosclerotic plaque. IMPRESSION: 1. No acute intracranial abnormality. 2. No acute displaced fracture or traumatic listhesis of the cervical spine. 3. Scattered cerebral parenchyma calcifications. Findings likely sequelae of prior infection/inflammation. 4.  Aortic Atherosclerosis (ICD10-I70.0). Electronically Signed   By: Morgane  Naveau M.D.   On: 12/20/2023 22:05    EKG: My personal interpretation of EKG shows: NSR    Assessment/Plan Principal Problem:   Closed hip fracture, left, initial encounter [facility]) Active Problems:   Leukocytosis   Hypokalemia   Traumatic hematoma of lower leg, right, initial encounter   History of blood clots   Essential hypertension   Hypothyroidism   PAF (paroxysmal atrial fibrillation) (HCC)   Chronic diastolic CHF (congestive heart failure) (HCC)   DNR (do not resuscitate)/DNI(Do Not intubate)    Assessment and Plan: * Closed hip fracture, left, initial  encounter (HCC) Admit to med/surg bed. NPO. IV dilaudid  prn for pain. Pt takes hydrocodone  on a chronic basis.  Traumatic hematoma of lower leg, right, initial encounter Pt with traumatic hematoma to right lower leg. She thinks her shoe may have hit her right leg when she fell. Pt on eliquis .       Hypokalemia Replete with IV Kcl since pt is NPO and lying supine.  Leukocytosis Likely due to demargination from stress of fracture. No evidence of any infection.  DNR (do not resuscitate)/DNI(Do Not intubate) Verified that pt wants to be DNR/DNI. She states she has a yellow DNR form attached to her refrigerator at home.  Chronic diastolic CHF (congestive heart failure) (HCC) Continue aldactone  12.5 mg daily, lasix  20 mg daily  PAF (paroxysmal atrial fibrillation) (HCC) Hold Eliquis  for her upcoming surgery.  cont cardizem  CD 120 mg qday, amiodarone  200 mg daily.  Hypothyroidism Continue synthroid  125 mcg daily.  Essential hypertension On cardizem  cd 120 mg daily, aldactone  12.5 mg daily, lasix  20 mg daily.  History of blood clots Will need  to continue Eliquis  after ORIF. 2.5 mg bid   DVT prophylaxis: SCDs Code Status: DNR/DNI(Do NOT Intubate) Family Communication: no family at bedside. Pt is decisional. She states her sister carolyn is aware pt has been admitted to hospital Disposition Plan: SNF  Consults called: ortho, trauma  Admission status: Inpatient, Med-Surg   Camellia Door, DO [facility] 12/21/2023, 2:09 AM

## 2023-12-21 NOTE — Assessment & Plan Note (Signed)
 Pt with traumatic hematoma to right lower leg. She thinks her shoe may have hit her right leg when she fell. Pt on eliquis .

## 2023-12-21 NOTE — Op Note (Signed)
 Orthopaedic Surgery Operative Note (CSN: [id number])  [name]  December 30, 1935 Date of Surgery: 12/20/2023 - 12/21/2023   Diagnoses:  left impacted femoral neck fracture  Procedure: Left hip CRPP   Operative Finding Successful completion of the planned procedure.  Patients short stature and bone size made the case somewhat difficult to obtain appropriate position of our screws.  That said we had good fixation with all 3 screws with good purchase with all 3.  Post-operative plan: The patient will be weightbearing to tolerance.  The patient will be discharged home.  DVT prophylaxis Aspirin  81 mg twice daily for 6 weeks.   Pain control with PRN pain medication preferring oral medicines.  Follow up plan will be scheduled in approximately 14-21 days for incision check and XR and suture removal.  Post-Op Diagnosis: Same Surgeons:Primary: Cristy Bonner DASEN, MD Assistants:Deb Madelyn RIGGERS Location: [facility] OR ROOM 03 Anesthesia: General Antibiotics: Ancef  2 g with local vancomycin  powder 1 g at the surgical site Tourniquet time:  Estimated Blood Loss: Minimal Complications: None Specimens: None Implants: Implant Name Type Inv. Item Serial No. Manufacturer Lot No. LRB No. Used Action  SCREW ASNIS [id number] - [id number] Screw SCREW ASNIS [id number]  [employer] ON TRAY Left 1 Implanted  SCREW CANN ASNIS 6.5X95 - [id number] Screw SCREW CANN ASNIS 6.5X95  [employer] ON TRAY Left 1 Implanted  SCREW ASNIS - [id number] Screw SCREW ASNIS  [employer] ON TRAY Left 1 Implanted    Indications for Surgery:   [name] is a 88 y.o. female with fall resulting in a impacted left femoral neck fracture.  Benefits and risks of operative and nonoperative management were discussed prior to surgery with patient/guardian(s) and informed consent form was completed.  Specific risks including infection, need for additional surgery, nonunion, failure fixation amongst  others.   Procedure:   The patient was identified properly. Informed consent was obtained and the surgical site was marked. The patient was taken up to suite where general anesthesia was induced.  The patient was positioned on the Hana table.  The left hip was prepped and draped in the usual sterile fashion.  Timeout was performed before the beginning of the case.  We used orthogonal fluoroscopy to confirm the fracture did not displace.  At this point we used fluoro to guide percutaneous placement of 2 Stryker 6.5 mm partially-threaded cannulated screws superiorly and an 8.0 fully threaded inferiorly on orthogonal views.  This was performed in an inverted triangle.    We placed K wires, drilled, and then placed partially treaded screws with washers.  Purchase was reasonable with all screws.  Around the world fluoroscopy confirmed position and lack of articular involvement.     We irrigated the wound copiously before placing local antibiotic as listed above.  We closed the incision in a multilayer fashion with nonabsorbable suture.  Sterile dressing was placed.  Patient was awoken taken to PACU in stable condition.  Haroldine Madelyn, PA-C, present and scrubbed throughout the case, critical for completion in a timely fashion, and for retraction, instrumentation, closure.

## 2023-12-21 NOTE — Hospital Course (Signed)
 HPI: 88 year old female with a history of paroxysmal A-fib, chronic diastolic heart failure, history of DVT on Coumadin, hypertension, hypothyroidism, presents to the ER today after mechanical fall at home.  She states that she was try to place something into invigorated.  Lost her grip on refrigerator and fell onto her left side.  Had immediate pain.  She was able to crawl and find her cell phone.  She called her neighbor.  EMS was called.  Patient brought to the ER.  Arrival temp 90.0 heart rate 84 blood pressure 133/77.  Blood pressure dropped to 81/62 which recovered with liter bolus of IV fluids.  Patient discovered to have a acute left femoral neck fracture.  [facility] consulted.  Orthopedics consulted.  Trauma surgery consulted.  Significant Events: Admitted 12/20/2023 left hip fracture   Significant Labs: White count 20, hemoglobin 13.3, platelets of 170 Sodium 138, potassium 3.3, chloride 98, bicarb 25, BUN 26, creatinine 0.97, glucose 141 AST of 45, ALT 56, total bili of 0.9 total protein 6.1, BUN 3.4 UA negative  Significant Imaging Studies: CT of the head demonstrates no acute intracranial normalities. CT C-spine negative for acute fracture of the cervical spine. CT chest abdomen pelvis demonstrated no acute intrathoracic, intra-abdominal or intrapelvic traumatic injury.  She does have a acutely impacted left femoral neck fracture.  Patient has a known 4.6 stable ascending thoracic aneurysm.  Antibiotic Therapy: Anti-infectives (From admission, onward)    None       Procedures:   Consultants: Trauma service orthopedics

## 2023-12-21 NOTE — Progress Notes (Signed)
 Contacted provider who admitted Pt. Dr Laurence request to slow down potassium because, Pt stated her arm was hurting. Provider stated it was ok to slow it down previous order was for 100 ml per hour. Slowed it down to 50 ml per hour Pt stated her arm felt better after the change.

## 2023-12-21 NOTE — Assessment & Plan Note (Signed)
 Likely due to demargination from stress of fracture. No evidence of any infection.

## 2023-12-21 NOTE — ED Notes (Signed)
 ED TO INPATIENT HANDOFF REPORT  ED Nurse Name and Phone #: [phone] Albino RN  S Name/Age/Gender [name] 88 y.o. female Room/Bed: 030C/030C  Code Status   Code Status: Limited: Do not attempt resuscitation (DNR) -DNR-LIMITED -Do Not Intubate/DNI   Home/SNF/Other Home Patient oriented to: self, place, time, and situation Is this baseline? Yes   Triage Complete: Triage complete  Chief Complaint Closed hip fracture, left, initial encounter [facility]) [S72.002A]  Triage Note Patient BIB EMS from home. She has a caregiver that comes and helps her at home some. Patient walks with a walker, walked tonight without her walker and had a mechanical fall landing on her L side. Patient is c/o L lower back pain that extends down into her L hip/ Patient is on Eliquis  (A-fib) but did not strike her head. Patient is alert/oriented.    Allergies Allergies  Allergen Reactions   Contrast Media [Iodinated Contrast Media] Shortness Of Breath   Iohexol  Shortness Of Breath   Mucinex  [Guaifenesin  Er] Other (See Comments)    DIZZINESS   Zanaflex [Tizanidine Hcl] Anxiety and Other (See Comments)    Dizziness   Penicillins Rash    Has patient had a PCN reaction causing immediate rash, facial/tongue/throat swelling, SOB or lightheadedness with hypotension: No Has patient had a PCN reaction causing severe rash involving mucus membranes or skin necrosis: No Has patient had a PCN reaction that required hospitalization No Has patient had a PCN reaction occurring within the last 10 years: No If all of the above answers are NO, then may proceed with Cephalosporin use.    Robaxin  [Methocarbamol ] Diarrhea    Level of Care/Admitting Diagnosis ED Disposition     ED Disposition  Admit   Condition  --   Comment  Hospital Area: [facility] [[id number]]  Level of Care: Med-Surg [[id number]]  May admit patient to [facility] or [facility] if equivalent level of care is available:: No  Covid  Evaluation: Asymptomatic - no recent exposure (last 10 days) testing not required  Diagnosis: Closed hip fracture, left, initial encounter [facility]) [[id number]]  Admitting Physician: LAURENCE, ERIC [[id number]]  Attending Physician: LAURENCE, ERIC [[id number]]  Certification:: I certify this patient will need inpatient services for at least 2 midnights  Expected Medical Readiness: 12/24/2023          B Medical/Surgery History 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/20/2011   R breast DCIS, ER/PR +   Cataract 3 and 10/92   bilateral   Chronic diastolic CHF (congestive heart failure) (HCC)    Compression fracture of fourth lumbar vertebra (HCC)    DVT (deep venous thrombosis) (HCC) 08/2011   LL extremity    Fibromyalgia    Fracture lumbar vertebra-closed (HCC)    Fracture of thoracic vertebra, closed (HCC)    GERD (gastroesophageal reflux disease)    H/O hiatal hernia    History of blood clots    History of radiation therapy 01/2012   R breast   Hypercholesteremia    Hypertension    DR JINNY BUDGE   Hypothyroidism    Mitral regurgitation    Mitral valve prolapse    Osteoporosis    PAF (paroxysmal atrial fibrillation) (HCC)    a. dx 11/2016.   Pulmonary hypertension (HCC)    Rib fractures    Thoracic ascending aortic aneurysm (HCC)    a. followed by Dr. Fleeta Ochoa.   Tricuspid  regurgitation    Past Surgical History:  Procedure Laterality Date   ANTERIOR CERVICAL DECOMP/DISCECTOMY FUSION N/A 09/09/2015   Procedure: Anterior Cervical Decompression and Fusion Cervical seven-Thorasic one ;  Surgeon: Fairy Levels, MD;  Location: [facility] NEURO ORS;  Service: Neurosurgery;  Laterality: N/A;   APPENDECTOMY  1940   BREAST SURGERY     CATARACT EXTRACTION  1992   EYE SURGERY  1940, 1956   HERNIA REPAIR  10/16/2006   [facility] - Dr Gladis   KYPHOPLASTY N/A 01/26/2013   Procedure: KYPHOPLASTY;  Surgeon: Victory Gens, MD;  Location: [facility] NEURO ORS;  Service:  Neurosurgery;  Laterality: N/A;  T11 and L1   KYPHOPLASTY N/A 06/21/2017   Procedure: Lumbar four Kyphoplasty;  Surgeon: Levels Fairy, MD;  Location[facility];  Service: Neurosurgery;  Laterality: N/A;   MASTECTOMY, PARTIAL  10/17/2011   Procedure: MASTECTOMY PARTIAL;  Surgeon: Sherlean JINNY Laughter, MD;  Location: [facility] OR;  Service: General;  Laterality: Right;  needle guided   TEE WITHOUT CARDIOVERSION N/A 12/04/2016   Procedure: TRANSESOPHAGEAL ECHOCARDIOGRAM (TEE);  Surgeon: Vinie JAYSON Maxcy, MD;  Location: [facility] ENDOSCOPY;  Service: Cardiovascular;  Laterality: N/A;   TONSILLECTOMY  1944     A IV Location/Drains/Wounds Patient Lines/Drains/Airways Status     Active Line/Drains/Airways     Name Placement date Placement time Site Days   Peripheral IV 12/20/23 18 G 1 Left Antecubital 12/20/23  1948  Antecubital  1   External Urinary Catheter 12/20/23  2010  --  1            Intake/Output Last 24 hours  Intake/Output Summary (Last 24 hours) at 12/21/2023 0212 Last data filed at 12/21/2023 0150 Gross per 24 hour  Intake 1000 ml  Output --  Net 1000 ml    Labs/Imaging Results for orders placed or performed during the hospital encounter of 12/20/23 (from the past 48 hours)  CBC with Differential     Status: Abnormal   Collection Time: 12/20/23 10:20 PM  Result Value Ref Range   WBC 20.0 (H) 4.0 - 10.5 K/uL   RBC 4.24 3.87 - 5.11 MIL/uL   Hemoglobin 13.3 12.0 - 15.0 g/dL   HCT 62.0 63.9 - 53.9 %   MCV 89.4 80.0 - 100.0 fL   MCH 31.4 26.0 - 34.0 pg   MCHC 35.1 30.0 - 36.0 g/dL   RDW 85.6 88.4 - 84.4 %   Platelets 170 150 - 400 K/uL   nRBC 0.0 0.0 - 0.2 %   Neutrophils Relative % 86 %   Neutro Abs 17.2 (H) 1.7 - 7.7 K/uL   Lymphocytes Relative 3 %   Lymphs Abs 0.6 (L) 0.7 - 4.0 K/uL   Monocytes Relative 10 %   Monocytes Absolute 2.1 (H) 0.1 - 1.0 K/uL   Eosinophils Relative 0 %   Eosinophils Absolute 0.0 0.0 - 0.5 K/uL   Basophils Relative 0 %   Basophils Absolute 0.0 0.0 - 0.1  K/uL   Immature Granulocytes 1 %   Abs Immature Granulocytes 0.12 (H) 0.00 - 0.07 K/uL    Comment: Performed at [facility], [address] [place], [place] [address]  Comprehensive metabolic panel     Status: Abnormal   Collection Time: 12/20/23 10:20 PM  Result Value Ref Range   Sodium 138 135 - 145 mmol/L   Potassium 3.3 (L) 3.5 - 5.1 mmol/L   Chloride 98 98 - 111 mmol/L   CO2 25 22 - 32 mmol/L   Glucose,  Bld 141 (H) 70 - 99 mg/dL    Comment: Glucose reference range applies only to samples taken after fasting for at least 8 hours.   BUN 26 (H) 8 - 23 mg/dL   Creatinine, Ser 9.02 0.44 - 1.00 mg/dL   Calcium  9.2 8.9 - 10.3 mg/dL   Total Protein 6.1 (L) 6.5 - 8.1 g/dL   Albumin  3.4 (L) 3.5 - 5.0 g/dL   AST 45 (H) 15 - 41 U/L   ALT 56 (H) 0 - 44 U/L   Alkaline Phosphatase 47 38 - 126 U/L   Total Bilirubin 0.9 0.0 - 1.2 mg/dL   GFR, Estimated 57 (L) >60 mL/min    Comment: (NOTE) Calculated using the CKD-EPI Creatinine Equation (2021)    Anion gap 15 5 - 15    Comment: Performed at [facility], [address] [place], [place] [address]  Urinalysis, w/ Reflex to Culture (Infection Suspected) -Urine, Clean Catch     Status: None   Collection Time: 12/21/23 12:15 AM  Result Value Ref Range   Specimen Source URINE, CLEAN CATCH    Color, Urine YELLOW YELLOW   APPearance CLEAR CLEAR   Specific Gravity, Urine 1.008 1.005 - 1.030   pH 7.0 5.0 - 8.0   Glucose, UA NEGATIVE NEGATIVE mg/dL   Hgb urine dipstick NEGATIVE NEGATIVE   Bilirubin Urine NEGATIVE NEGATIVE   Ketones, ur NEGATIVE NEGATIVE mg/dL   Protein, ur NEGATIVE NEGATIVE mg/dL   Nitrite NEGATIVE NEGATIVE   Leukocytes,Ua NEGATIVE NEGATIVE   RBC / HPF 0-5 0 - 5 RBC/hpf   WBC, UA 0-5 0 - 5 WBC/hpf    Comment:        Reflex urine culture not performed if WBC <=10, OR if Squamous epithelial cells >5. If Squamous epithelial cells >5 suggest recollection.    Bacteria, UA NONE SEEN NONE SEEN   Squamous  Epithelial / HPF 0-5 0 - 5 /HPF    Comment: Performed at [facility], [address], [place], [place] [address]   CT T-SPINE NO CHARGE Result Date: 12/20/2023 CLINICAL DATA:  fall EXAM: CT Thoracic and Lumbar spine without contrast TECHNIQUE: Multiplanar CT images of the thoracic and lumbar spine were reconstructed from contemporary CT of the Chest, Abdomen, and Pelvis. RADIATION DOSE REDUCTION: This exam was performed according to the departmental dose-optimization program which includes automated exposure control, adjustment of the mA and/or kV according to patient size and/or use of iterative reconstruction technique. CONTRAST:  None or No additional COMPARISON:  None Available. FINDINGS: CT THORACIC SPINE FINDINGS Alignment: Normal. Vertebrae: Diffusely decreased bone density. C7-T1 anterior cervical discectomy and fusion surgical hardware. Interval worsening of T10 superior endplate compression fracture with 40% vertebral body height. T8, T9, T11, T12 chronic stable fractures. Kyphoplasty of T11. No acute fracture or focal pathologic process. Paraspinal and other soft tissues: Negative. Disc levels: Multilevel severe intervertebral disc space narrowing. CT LUMBAR SPINE FINDINGS Segmentation: 5 lumbar type vertebrae. Alignment: Normal. Vertebrae: Diffusely decreased bone density. Chronic stable L1, L2, L3, L4 fractures status post L1 and L4 kyphoplasty. No acute fracture or focal pathologic process. Paraspinal and other soft tissues: Negative. Disc levels: Multilevel intervertebral disc space vacuum phenomenon. IMPRESSION: 1. Interval worsening of T10 superior endplate compression fracture with 40% vertebral body height. Correlate with point tenderness to palpation for an acute component. 2. Multilevel chronic stable thoracolumbar fractures. 3. Diffusely decreased bone density. Electronically Signed   By: Morgane  Naveau M.D.   On: 12/20/2023 22:43  CT L-SPINE NO CHARGE Result Date:  12/20/2023 CLINICAL DATA:  fall EXAM: CT Thoracic and Lumbar spine without contrast TECHNIQUE: Multiplanar CT images of the thoracic and lumbar spine were reconstructed from contemporary CT of the Chest, Abdomen, and Pelvis. RADIATION DOSE REDUCTION: This exam was performed according to the departmental dose-optimization program which includes automated exposure control, adjustment of the mA and/or kV according to patient size and/or use of iterative reconstruction technique. CONTRAST:  None or No additional COMPARISON:  None Available. FINDINGS: CT THORACIC SPINE FINDINGS Alignment: Normal. Vertebrae: Diffusely decreased bone density. C7-T1 anterior cervical discectomy and fusion surgical hardware. Interval worsening of T10 superior endplate compression fracture with 40% vertebral body height. T8, T9, T11, T12 chronic stable fractures. Kyphoplasty of T11. No acute fracture or focal pathologic process. Paraspinal and other soft tissues: Negative. Disc levels: Multilevel severe intervertebral disc space narrowing. CT LUMBAR SPINE FINDINGS Segmentation: 5 lumbar type vertebrae. Alignment: Normal. Vertebrae: Diffusely decreased bone density. Chronic stable L1, L2, L3, L4 fractures status post L1 and L4 kyphoplasty. No acute fracture or focal pathologic process. Paraspinal and other soft tissues: Negative. Disc levels: Multilevel intervertebral disc space vacuum phenomenon. IMPRESSION: 1. Interval worsening of T10 superior endplate compression fracture with 40% vertebral body height. Correlate with point tenderness to palpation for an acute component. 2. Multilevel chronic stable thoracolumbar fractures. 3. Diffusely decreased bone density. Electronically Signed   By: Morgane  Naveau M.D.   On: 12/20/2023 22:43   CT CHEST ABDOMEN PELVIS WO CONTRAST Result Date: 12/20/2023 CLINICAL DATA:  Polytrauma, blunt fall, back pain, hlp pain. hit head. eliquis  use. EXAM: CT CHEST, ABDOMEN AND PELVIS WITHOUT CONTRAST TECHNIQUE:  Multidetector CT imaging of the chest, abdomen and pelvis was performed following the standard protocol without IV contrast. RADIATION DOSE REDUCTION: This exam was performed according to the departmental dose-optimization program which includes automated exposure control, adjustment of the mA and/or kV according to patient size and/or use of iterative reconstruction technique. COMPARISON:  CT chest abdomen pelvis 11/22/2022, CT pelvis 06/08/2011, CT abdomen pelvis 05/30/2017 FINDINGS: CHEST: Cardiovascular: Stable aneurysmal ascending thoracic aorta measuring up to 4.6 cm. The heart is normal in size. No significant pericardial effusion. Severe atherosclerotic plaque. Mitral annular calcification. Coronary artery calcification. Lungs/Pleura: Left lower lobe passive atelectasis. No focal consolidation. No pulmonary nodule. No pulmonary mass. No pulmonary contusion or laceration. No pneumatocele formation. No pleural effusion. No pneumothorax. No hemothorax. Mediastinum/Nodes: No pneumomediastinum. The central airways are patent. The esophagus is unremarkable. Stable large hiatal hernia containing the entire stomach. The thyroid  is unremarkable. Limited evaluation for hilar lymphadenopathy on this noncontrast study. No mediastinal or axillary lymphadenopathy. Musculoskeletal/Chest wall No chest wall mass. Diffusely decreased bone density. Old healed right rib fractures. No acute rib or sternal fracture. Please see separately dictated CT thoracolumbar spine 12/20/2023. ABDOMEN / PELVIS: Hepatobiliary: Not enlarged. Hyperdense hepatic parenchyma.  No focal lesion. The gallbladder is otherwise unremarkable with no radio-opaque gallstones. No biliary ductal dilatation. Pancreas: Normal pancreatic contour. No main pancreatic duct dilatation. Spleen: Not enlarged. No focal lesion. Adrenals/Urinary Tract: No nodularity bilaterally. No hydroureteronephrosis. No nephroureterolithiasis. No contour deforming renal mass. The  urinary bladder is unremarkable. Stomach/Bowel: No small or large bowel wall thickening or dilatation. Stool throughout the majority of the colon. The appendix is unremarkable. Vasculature/Lymphatic: Moderate atherosclerotic plaque. No abdominal aorta or iliac aneurysm. No abdominal, pelvic, inguinal lymphadenopathy. Reproductive: Normal. Other: No simple free fluid ascites. No pneumoperitoneum. No mesenteric hematoma identified. No organized fluid collection. Musculoskeletal: No significant soft tissue hematoma.  Persistent densely sclerotic right iliac bone lesion. Diffusely decreased bone density. Acute impacted left femoral neck fracture. Please see separately dictated CT thoracolumbar spine 12/20/2023. Ports and Devices: None. IMPRESSION: 1. No acute intrathoracic, intra-abdominal, intrapelvic traumatic injury with limited evaluation on this noncontrast study. 2. Please see separately dictated CT thoracolumbar spine 12/20/2023. 3. Stable aneurysmal ascending thoracic aorta (4.6 cm). Ascending thoracic aortic aneurysm. Recommend semi-annual imaging followup by CTA or MRA and referral to cardiothoracic surgery if not already obtained. This recommendation follows 2010 ACCF/AHA/AATS/ACR/ASA/SCA/SCAI/SIR/STS/SVM Guidelines for the Diagnosis and Management of Patients With Thoracic Aortic Disease. Circulation. 2010; 121: Z733-z630. Aortic aneurysm NOS (ICD10-I71.9). Aortic aneurysm NOS (ICD10-I71.9). 4. Aortic Atherosclerosis (ICD10-I70.0) including coronary artery and mitral annular calcification. 5. Acute impacted left femoral neck fracture. 6. Constipation. Electronically Signed   By: Morgane  Naveau M.D.   On: 12/20/2023 22:29   DG Ankle Complete Right Result Date: 12/20/2023 CLINICAL DATA:  Status post fall. EXAM: RIGHT ANKLE - COMPLETE 3+ VIEW COMPARISON:  None Available. FINDINGS: There is no evidence of fracture, dislocation, or joint effusion. There is no evidence of arthropathy or other focal bone  abnormality. Moderate severity soft tissue swelling is seen along the medial aspect of the distal right tibial shaft. IMPRESSION: Medial soft tissue swelling without evidence of acute fracture or dislocation. Electronically Signed   By: Suzen Dials M.D.   On: 12/20/2023 22:21   DG Tibia/Fibula Right Result Date: 12/20/2023 CLINICAL DATA:  Status post fall. EXAM: RIGHT TIBIA AND FIBULA - 2 VIEW COMPARISON:  None Available. FINDINGS: There is no evidence of fracture or other focal bone lesions. Mild to moderate severity focal soft tissue swelling is seen along the medial aspect of the distal right tibial shaft. IMPRESSION: Focal soft tissue swelling along the medial aspect of the distal right tibial shaft. Electronically Signed   By: Suzen Dials M.D.   On: 12/20/2023 22:19   DG Hip [place] W or [place] Pelvis 1 View Left Result Date: 12/20/2023 CLINICAL DATA:  Status post fall. EXAM: DG HIP (WITH OR WITHOUT PELVIS) 1V PORT LEFT COMPARISON:  None Available. FINDINGS: There is no evidence of an acute hip fracture or dislocation. Mild degenerative changes are seen in the form of joint space narrowing and acetabular sclerosis. IMPRESSION: Mild degenerative changes without evidence of an acute fracture or dislocation. Electronically Signed   By: Suzen Dials M.D.   On: 12/20/2023 22:18   CT HEAD WO CONTRAST ( ) Result Date: 12/20/2023 CLINICAL DATA:  Polytrauma, blunt fall; Polytrauma, blunt EXAM: CT HEAD WITHOUT CONTRAST CT CERVICAL SPINE WITHOUT CONTRAST TECHNIQUE: Multidetector CT imaging of the head and cervical spine was performed following the standard protocol without intravenous contrast. Multiplanar CT image reconstructions of the cervical spine were also generated. RADIATION DOSE REDUCTION: This exam was performed according to the departmental dose-optimization program which includes automated exposure control, adjustment of the mA and/or kV according to patient size and/or use of  iterative reconstruction technique. COMPARISON:  None Available. FINDINGS: CT HEAD FINDINGS Brain: Cerebral ventricle sizes are concordant with the degree of cerebral volume loss. Patchy and confluent areas of decreased attenuation are noted throughout the deep and periventricular white matter of the cerebral hemispheres bilaterally, compatible with chronic microvascular ischemic disease. Scattered cerebral parenchyma calcifications. No evidence of large-territorial acute infarction. No parenchymal hemorrhage. No mass lesion. No extra-axial collection. No mass effect or midline shift. No hydrocephalus. Basilar cisterns are patent. Vascular: No hyperdense vessel. Skull: No acute fracture or focal lesion. Sinuses/Orbits: Almost complete  opacification of left maxillary sinus. Otherwise paranasal sinuses and mastoid air cells are clear. Bilateral lens replacement. Otherwise the orbits are unremarkable. Other: None. CT CERVICAL SPINE FINDINGS Alignment: Grade 1 anterolisthesis of C4 on C5. Skull base and vertebrae: C7-T1 anterior cervical discectomy and fusion surgical hardware. Multilevel moderate severe degenerative changes of the spine. No associated severe osseous central canal stenosis. Associated moderate to severe left C5-C6 osseous neural foraminal stenosis. No acute fracture. No aggressive appearing focal osseous lesion or focal pathologic process. Soft tissues and spinal canal: No prevertebral fluid or swelling. No visible canal hematoma. Upper chest: Unremarkable. Other: Atherosclerotic plaque. IMPRESSION: 1. No acute intracranial abnormality. 2. No acute displaced fracture or traumatic listhesis of the cervical spine. 3. Scattered cerebral parenchyma calcifications. Findings likely sequelae of prior infection/inflammation. 4.  Aortic Atherosclerosis (ICD10-I70.0). Electronically Signed   By: Morgane  Naveau M.D.   On: 12/20/2023 22:05   CT Cervical Spine Wo Contrast Result Date: 12/20/2023 CLINICAL DATA:   Polytrauma, blunt fall; Polytrauma, blunt EXAM: CT HEAD WITHOUT CONTRAST CT CERVICAL SPINE WITHOUT CONTRAST TECHNIQUE: Multidetector CT imaging of the head and cervical spine was performed following the standard protocol without intravenous contrast. Multiplanar CT image reconstructions of the cervical spine were also generated. RADIATION DOSE REDUCTION: This exam was performed according to the departmental dose-optimization program which includes automated exposure control, adjustment of the mA and/or kV according to patient size and/or use of iterative reconstruction technique. COMPARISON:  None Available. FINDINGS: CT HEAD FINDINGS Brain: Cerebral ventricle sizes are concordant with the degree of cerebral volume loss. Patchy and confluent areas of decreased attenuation are noted throughout the deep and periventricular white matter of the cerebral hemispheres bilaterally, compatible with chronic microvascular ischemic disease. Scattered cerebral parenchyma calcifications. No evidence of large-territorial acute infarction. No parenchymal hemorrhage. No mass lesion. No extra-axial collection. No mass effect or midline shift. No hydrocephalus. Basilar cisterns are patent. Vascular: No hyperdense vessel. Skull: No acute fracture or focal lesion. Sinuses/Orbits: Almost complete opacification of left maxillary sinus. Otherwise paranasal sinuses and mastoid air cells are clear. Bilateral lens replacement. Otherwise the orbits are unremarkable. Other: None. CT CERVICAL SPINE FINDINGS Alignment: Grade 1 anterolisthesis of C4 on C5. Skull base and vertebrae: C7-T1 anterior cervical discectomy and fusion surgical hardware. Multilevel moderate severe degenerative changes of the spine. No associated severe osseous central canal stenosis. Associated moderate to severe left C5-C6 osseous neural foraminal stenosis. No acute fracture. No aggressive appearing focal osseous lesion or focal pathologic process. Soft tissues and spinal  canal: No prevertebral fluid or swelling. No visible canal hematoma. Upper chest: Unremarkable. Other: Atherosclerotic plaque. IMPRESSION: 1. No acute intracranial abnormality. 2. No acute displaced fracture or traumatic listhesis of the cervical spine. 3. Scattered cerebral parenchyma calcifications. Findings likely sequelae of prior infection/inflammation. 4.  Aortic Atherosclerosis (ICD10-I70.0). Electronically Signed   By: Morgane  Naveau M.D.   On: 12/20/2023 22:05    Pending Labs Unresulted Labs (From admission, onward)    None       Vitals/Pain Today's Vitals   12/20/23 2315 12/20/23 2330 12/20/23 2345 12/21/23 0000  BP: (!) 88/65 96/71 109/78 110/78  Pulse: 70 72 74 76  Resp:   17   Temp:   98.1 F (36.7 C)   TempSrc:   Oral   SpO2: 93% 94% 97% 94%  Weight:      Height:      PainSc:   5      Isolation Precautions No active isolations  Medications Medications  potassium  chloride 10 mEq in 100 mL IVPB (has no administration in time range)  lactated ringers  infusion (has no administration in time range)  fentaNYL  (SUBLIMAZE ) injection 50 mcg (50 mcg Intravenous Given 12/20/23 2123)  sodium chloride  0.9 % bolus 1,000 mL (0 mLs Intravenous Stopped 12/21/23 0150)  fentaNYL  (SUBLIMAZE ) injection 25 mcg (25 mcg Intravenous Given 12/21/23 0153)    Mobility walks with device     Focused Assessments Neuro Assessment Handoff:  Swallow screen pass? Yes  Cardiac Rhythm: Normal sinus rhythm       Neuro Assessment:   Neuro Checks:      Has TPA been given? No If patient is a Neuro Trauma and patient is going to OR before floor call report to 4N Charge nurse: [phone] or [phone]   R Recommendations: See Admitting Provider Note  Report given to:   Additional Notes:

## 2023-12-21 NOTE — Transfer of Care (Signed)
 Immediate Anesthesia Transfer of Care Note  Patient: [name]  Procedure(s) Performed: PERCUTANEOUS FIXATION OF FEMORAL NECK (Left: Hip)  Patient Location: PACU  Anesthesia Type:General  Level of Consciousness: awake, drowsy, and patient cooperative  Airway & Oxygen Therapy: Patient Spontanous Breathing and Patient connected to nasal cannula oxygen  Post-op Assessment: Report given to RN and Post -op Vital signs reviewed and stable  Post vital signs: Reviewed and stable  Last Vitals:  Vitals Value Taken Time  BP 110/76 12/21/23 1019  Temp    Pulse 66 12/21/23 1021  Resp 23 12/21/23 1021  SpO2 95 % 12/21/23 1021  Vitals shown include unfiled device data.  Last Pain:  Vitals:   12/21/23 0621  TempSrc:   PainSc: 10-Worst pain ever         Complications: No notable events documented.

## 2023-12-21 NOTE — Anesthesia Procedure Notes (Signed)
 Arterial Line Insertion Start/End2/06/2024 8:05 AM, 12/21/2023 8:10 AM Performed by: Claudene Arlin LABOR, CRNA, CRNA  Preanesthetic checklist: patient identified, IV checked, site marked, risks and benefits discussed, surgical consent, monitors and equipment checked, pre-op evaluation, timeout performed and anesthesia consent Lidocaine  1% used for infiltration Left, radial was placed Catheter size: 20 G Hand hygiene performed , maximum sterile barriers used  and Seldinger technique used Allen's test indicative of satisfactory collateral circulation Attempts: 1 Procedure performed without using ultrasound guided technique. Following insertion, dressing applied and Biopatch. Post procedure assessment: normal and unchanged  Patient tolerated the procedure well with no immediate complications.

## 2023-12-21 NOTE — Progress Notes (Addendum)
 PROGRESS NOTE   [name]  FMW:[id number]    DOB: 1936/07/01    DOA: 12/20/2023  PCP: Ransom Other, MD   I have briefly reviewed patients previous medical records in [facility].  Chief Complaint  Patient presents with   Fall   Hip Pain    Brief Hospital Course:  88 year old female with medical history significant for paroxysmal A-fib, chronic diastolic CHF, DVT on Coumadin, hypertension, hypothyroidism, presented to the ED following a mechanical fall at home.  She was attempting to place something into the refrigerator, lost her grip and fell onto her left side with prominent pain.  Admitted for close left hip fracture and hematoma of right lower leg.  Orthopedic trauma and trauma surgery consulted.  She underwent left hip closed reduction and percutaneous pinning by Dr. Cristy on 2/8.   Assessment & Plan:  Principal Problem:   Closed hip fracture, left, initial encounter [facility]) Active Problems:   Leukocytosis   Hypokalemia   Traumatic hematoma of lower leg, right, initial encounter   History of blood clots   Essential hypertension   Hypothyroidism   PAF (paroxysmal atrial fibrillation) (HCC)   Chronic diastolic CHF (congestive heart failure) (HCC)   DNR (do not resuscitate)/DNI(Do Not intubate)   Closed left hip fracture/left impacted femoral neck fracture Orthopedics consulted and s/p closed reduction and percutaneous pinning by Dr. Cristy on 2/8. Management per orthopedics: WBAT, DVT prophylaxis-can likely do continued home Eliquis  rather than Ortho recommended aspirin  81 Mg twice daily x 6 weeks (will need to check with orthopedics regarding timing of starting back Eliquis ), multimodality pain control, outpatient follow-up in office in 14 to 21 days for incision check, x-ray and suture removal.  Addendum Communicated with Ortho PA who advised that it was okay to resume Eliquis  2.5 Mg twice daily starting tomorrow and no need for aspirin .  Chronic versus acute  on chronic T10 compression fracture Patient seen postop.  Seems irritable and not wanting to speak much. Per CCS, did not report specific pain in the back. Will reassess in a.m. to see if she has any new symptoms pertaining to back pain requiring further evaluation.  Traumatic right lower leg hematoma Monitor  Mechanical fall CT C/A/P: No acute traumatic injury.  Hypokalemia Replaced   Leukocytosis Likely due to demargination from stress of fracture. No evidence of any infection. Slightly better.  Continue to trend daily CBCs.   Chronic diastolic CHF (congestive heart failure) (HCC) Continue aldactone  12.5 mg daily, lasix  20 mg daily Compensated.   PAF (paroxysmal atrial fibrillation) (HCC) Held Eliquis  for her upcoming surgery.  cont cardizem  CD 120 mg qday, amiodarone  200 mg daily. Resume Eliquis  when appropriate with orthopedics.  Also to check with them if planned aspirin  can be discontinued.   Hypothyroidism Continue synthroid  125 mcg daily.   Essential hypertension On cardizem  cd 120 mg daily, aldactone  12.5 mg daily, lasix  20 mg daily. Controlled.   History of blood clots Will need to continue Eliquis  after ORIF. 2.5 mg bid  Mild transaminitis Unclear etiology.  CK normal. Follow CMP.  Stable aneurysmal ascending thoracic aorta (4.6 cm) Noted on CT C/A/P 2/7 Recommend semiannual imaging follow-up by CTA or MRA and referral to cardiothoracic surgery if not already done.  Outpatient follow-up.  Constipation Noted on CT A/P Aggressive bowel regimen.  Body mass index is 18.77 kg/m.  Nutritional Status Nutrition Problem: Increased nutrient needs Etiology: hip fracture, post-op healing Signs/Symptoms: estimated needs Interventions: Ensure Enlive (each supplement provides 350kcal  and 20 grams of protein), MVI  DVT prophylaxis: enoxaparin  (LOVENOX ) injection 40 mg Start: 12/22/23 0800 SCDs Start: 12/21/23 1224 SCDs Start: 12/21/23 0304     Code Status:  Limited: Do not attempt resuscitation (DNR) -DNR-LIMITED -Do Not Intubate/DNI :  Family Communication: None at bedside. Disposition:  Status is: Inpatient Remains inpatient appropriate because: Immediate postop, needs therapies evaluation and likely will need SNF.     Consultants:   Orthopedics Trauma surgery  Procedures:   As noted above  Antimicrobials:      Subjective:  Seen this afternoon postop.  Patient was in OR this morning and was unable to see her.  Patient seen along with her female RN at bedside.  Patient seems irritable, not saying much.  Per nursing, some confusion.  No pain discussed by patient during the visit.  States that she follows up with a new cardiologist at [facility] office  Objective:   Vitals:   12/21/23 1115 12/21/23 1130 12/21/23 1147 12/21/23 1610  BP: 100/72 102/70 100/75 100/74  Pulse: 63 63 65 70  Resp: 10 12 16 16   Temp:  97.9 F (36.6 C) (!) 97.5 F (36.4 C) 97.6 F (36.4 C)  TempSrc:   Oral Oral  SpO2: 93% 94% 100%   Weight:      Height:        General exam: Elderly female, moderately built, thinly nourished lying propped up in bed without distress. Respiratory system: Clear to auscultation. Respiratory effort normal. Cardiovascular system: S1 & S2 heard, RRR.  No JVD or pedal edema.  2/6 SEM best heard at apex.  No pedal edema. Gastrointestinal system: Abdomen is nondistended, soft and nontender. No organomegaly or masses felt. Normal bowel sounds heard. Central nervous system: Alert and oriented. No focal neurological deficits. Extremities: Bilateral lower extremity movements restricted due to left hip surgery and right leg hematoma.  Symmetric normal movements of upper extremities. Skin: No rashes, lesions or ulcers.  Small hematoma noted over right lower leg anteriorly.  Details as in picture below from admission. Psychiatry: Judgement and insight appear somewhat impaired. Mood & affect appropriate.        Data  Reviewed:   I have personally reviewed following labs and imaging studies   CBC: Recent Labs  Lab 12/20/23 2220 12/21/23 0705 12/21/23 1332  WBC 20.0* 19.9* 18.1*  NEUTROABS 17.2* 17.8*  --   HGB 13.3 13.1 12.4  HCT 37.9 36.7 35.7*  MCV 89.4 88.4 88.6  PLT 170 170 151    Basic Metabolic Panel: Recent Labs  Lab 12/20/23 2220 12/21/23 0705  NA 138 136  K 3.3* 4.1  CL 98 101  CO2 25 22  GLUCOSE 141* 122*  BUN 26* 26*  CREATININE 0.97 0.90  CALCIUM  9.2 8.4*    Liver Function Tests: Recent Labs  Lab 12/20/23 2220 12/21/23 0705  AST 45* 49*  ALT 56* 60*  ALKPHOS 47 46  BILITOT 0.9 0.9  PROT 6.1* 6.0*  ALBUMIN  3.4* 3.3*    CBG: No results for input(s): GLUCAP in the last 168 hours.  Microbiology Studies:   Recent Results (from the past 240 hours)  Surgical PCR screen     Status: None   Collection Time: 12/21/23  3:22 AM   Specimen: Nasal Mucosa; Nasal Swab  Result Value Ref Range Status   MRSA, PCR NEGATIVE NEGATIVE Final   Staphylococcus aureus NEGATIVE NEGATIVE Final    Comment: (NOTE) The Xpert SA Assay (FDA approved for NASAL specimens in patients 22  years of age and older), is one component of a comprehensive surveillance program. It is not intended to diagnose infection nor to guide or monitor treatment. Performed at [facility], [address], [place], [place] [address]     Radiology Studies:  DG FEMUR MIN 2 VIEWS LEFT Result Date: 12/21/2023 CLINICAL DATA:  Status post repair of hip fracture. EXAM: LEFT FEMUR 2 VIEWS COMPARISON:  Preoperative imaging. FINDINGS: Three screws traverse left femoral neck fracture. Stable fracture alignment. Recent postsurgical change includes air and edema in the soft tissues. The distal femur is intact. IMPRESSION: ORIF left proximal femur fracture. Electronically Signed   By: Andrea Gasman M.D.   On: 12/21/2023 11:16   DG FEMUR MIN 2 VIEWS LEFT Result Date: 12/21/2023 CLINICAL DATA:  Elective surgery.  EXAM: LEFT FEMUR 2 VIEWS COMPARISON:  Radiograph yesterday FINDINGS: Four fluoroscopic spot views of the left proximal femur submitted from the operating room. Pinning of femoral neck fracture. Fluoroscopy time 140.8 seconds. Dose 22.65 mGy. IMPRESSION: Procedural fluoroscopy during left femoral neck fracture pinning. Electronically Signed   By: Andrea Gasman M.D.   On: 12/21/2023 11:15   DG C-Arm 1-60 Min-No Report Result Date: 12/21/2023 Fluoroscopy was utilized by the requesting physician.  No radiographic interpretation.   DG C-Arm 1-60 Min-No Report Result Date: 12/21/2023 Fluoroscopy was utilized by the requesting physician.  No radiographic interpretation.   CT T-SPINE NO CHARGE Result Date: 12/20/2023 CLINICAL DATA:  fall EXAM: CT Thoracic and Lumbar spine without contrast TECHNIQUE: Multiplanar CT images of the thoracic and lumbar spine were reconstructed from contemporary CT of the Chest, Abdomen, and Pelvis. RADIATION DOSE REDUCTION: This exam was performed according to the departmental dose-optimization program which includes automated exposure control, adjustment of the mA and/or kV according to patient size and/or use of iterative reconstruction technique. CONTRAST:  None or No additional COMPARISON:  None Available. FINDINGS: CT THORACIC SPINE FINDINGS Alignment: Normal. Vertebrae: Diffusely decreased bone density. C7-T1 anterior cervical discectomy and fusion surgical hardware. Interval worsening of T10 superior endplate compression fracture with 40% vertebral body height. T8, T9, T11, T12 chronic stable fractures. Kyphoplasty of T11. No acute fracture or focal pathologic process. Paraspinal and other soft tissues: Negative. Disc levels: Multilevel severe intervertebral disc space narrowing. CT LUMBAR SPINE FINDINGS Segmentation: 5 lumbar type vertebrae. Alignment: Normal. Vertebrae: Diffusely decreased bone density. Chronic stable L1, L2, L3, L4 fractures status post L1 and L4 kyphoplasty.  No acute fracture or focal pathologic process. Paraspinal and other soft tissues: Negative. Disc levels: Multilevel intervertebral disc space vacuum phenomenon. IMPRESSION: 1. Interval worsening of T10 superior endplate compression fracture with 40% vertebral body height. Correlate with point tenderness to palpation for an acute component. 2. Multilevel chronic stable thoracolumbar fractures. 3. Diffusely decreased bone density. Electronically Signed   By: Morgane  Naveau M.D.   On: 12/20/2023 22:43   CT L-SPINE NO CHARGE Result Date: 12/20/2023 CLINICAL DATA:  fall EXAM: CT Thoracic and Lumbar spine without contrast TECHNIQUE: Multiplanar CT images of the thoracic and lumbar spine were reconstructed from contemporary CT of the Chest, Abdomen, and Pelvis. RADIATION DOSE REDUCTION: This exam was performed according to the departmental dose-optimization program which includes automated exposure control, adjustment of the mA and/or kV according to patient size and/or use of iterative reconstruction technique. CONTRAST:  None or No additional COMPARISON:  None Available. FINDINGS: CT THORACIC SPINE FINDINGS Alignment: Normal. Vertebrae: Diffusely decreased bone density. C7-T1 anterior cervical discectomy and fusion surgical hardware. Interval worsening of T10 superior  endplate compression fracture with 40% vertebral body height. T8, T9, T11, T12 chronic stable fractures. Kyphoplasty of T11. No acute fracture or focal pathologic process. Paraspinal and other soft tissues: Negative. Disc levels: Multilevel severe intervertebral disc space narrowing. CT LUMBAR SPINE FINDINGS Segmentation: 5 lumbar type vertebrae. Alignment: Normal. Vertebrae: Diffusely decreased bone density. Chronic stable L1, L2, L3, L4 fractures status post L1 and L4 kyphoplasty. No acute fracture or focal pathologic process. Paraspinal and other soft tissues: Negative. Disc levels: Multilevel intervertebral disc space vacuum phenomenon. IMPRESSION:  1. Interval worsening of T10 superior endplate compression fracture with 40% vertebral body height. Correlate with point tenderness to palpation for an acute component. 2. Multilevel chronic stable thoracolumbar fractures. 3. Diffusely decreased bone density. Electronically Signed   By: Morgane  Naveau M.D.   On: 12/20/2023 22:43   CT CHEST ABDOMEN PELVIS WO CONTRAST Result Date: 12/20/2023 CLINICAL DATA:  Polytrauma, blunt fall, back pain, hlp pain. hit head. eliquis  use. EXAM: CT CHEST, ABDOMEN AND PELVIS WITHOUT CONTRAST TECHNIQUE: Multidetector CT imaging of the chest, abdomen and pelvis was performed following the standard protocol without IV contrast. RADIATION DOSE REDUCTION: This exam was performed according to the departmental dose-optimization program which includes automated exposure control, adjustment of the mA and/or kV according to patient size and/or use of iterative reconstruction technique. COMPARISON:  CT chest abdomen pelvis 11/22/2022, CT pelvis 06/08/2011, CT abdomen pelvis 05/30/2017 FINDINGS: CHEST: Cardiovascular: Stable aneurysmal ascending thoracic aorta measuring up to 4.6 cm. The heart is normal in size. No significant pericardial effusion. Severe atherosclerotic plaque. Mitral annular calcification. Coronary artery calcification. Lungs/Pleura: Left lower lobe passive atelectasis. No focal consolidation. No pulmonary nodule. No pulmonary mass. No pulmonary contusion or laceration. No pneumatocele formation. No pleural effusion. No pneumothorax. No hemothorax. Mediastinum/Nodes: No pneumomediastinum. The central airways are patent. The esophagus is unremarkable. Stable large hiatal hernia containing the entire stomach. The thyroid  is unremarkable. Limited evaluation for hilar lymphadenopathy on this noncontrast study. No mediastinal or axillary lymphadenopathy. Musculoskeletal/Chest wall No chest wall mass. Diffusely decreased bone density. Old healed right rib fractures. No acute rib  or sternal fracture. Please see separately dictated CT thoracolumbar spine 12/20/2023. ABDOMEN / PELVIS: Hepatobiliary: Not enlarged. Hyperdense hepatic parenchyma.  No focal lesion. The gallbladder is otherwise unremarkable with no radio-opaque gallstones. No biliary ductal dilatation. Pancreas: Normal pancreatic contour. No main pancreatic duct dilatation. Spleen: Not enlarged. No focal lesion. Adrenals/Urinary Tract: No nodularity bilaterally. No hydroureteronephrosis. No nephroureterolithiasis. No contour deforming renal mass. The urinary bladder is unremarkable. Stomach/Bowel: No small or large bowel wall thickening or dilatation. Stool throughout the majority of the colon. The appendix is unremarkable. Vasculature/Lymphatic: Moderate atherosclerotic plaque. No abdominal aorta or iliac aneurysm. No abdominal, pelvic, inguinal lymphadenopathy. Reproductive: Normal. Other: No simple free fluid ascites. No pneumoperitoneum. No mesenteric hematoma identified. No organized fluid collection. Musculoskeletal: No significant soft tissue hematoma. Persistent densely sclerotic right iliac bone lesion. Diffusely decreased bone density. Acute impacted left femoral neck fracture. Please see separately dictated CT thoracolumbar spine 12/20/2023. Ports and Devices: None. IMPRESSION: 1. No acute intrathoracic, intra-abdominal, intrapelvic traumatic injury with limited evaluation on this noncontrast study. 2. Please see separately dictated CT thoracolumbar spine 12/20/2023. 3. Stable aneurysmal ascending thoracic aorta (4.6 cm). Ascending thoracic aortic aneurysm. Recommend semi-annual imaging followup by CTA or MRA and referral to cardiothoracic surgery if not already obtained. This recommendation follows 2010 ACCF/AHA/AATS/ACR/ASA/SCA/SCAI/SIR/STS/SVM Guidelines for the Diagnosis and Management of Patients With Thoracic Aortic Disease. Circulation. 2010; 121: [id number]. Aortic aneurysm NOS (ICD10-I71.9). Aortic aneurysm  NOS  (ICD10-I71.9). 4. Aortic Atherosclerosis (ICD10-I70.0) including coronary artery and mitral annular calcification. 5. Acute impacted left femoral neck fracture. 6. Constipation. Electronically Signed   By: Morgane  Naveau M.D.   On: 12/20/2023 22:29   DG Ankle Complete Right Result Date: 12/20/2023 CLINICAL DATA:  Status post fall. EXAM: RIGHT ANKLE - COMPLETE 3+ VIEW COMPARISON:  None Available. FINDINGS: There is no evidence of fracture, dislocation, or joint effusion. There is no evidence of arthropathy or other focal bone abnormality. Moderate severity soft tissue swelling is seen along the medial aspect of the distal right tibial shaft. IMPRESSION: Medial soft tissue swelling without evidence of acute fracture or dislocation. Electronically Signed   By: Suzen Dials M.D.   On: 12/20/2023 22:21   DG Tibia/Fibula Right Result Date: 12/20/2023 CLINICAL DATA:  Status post fall. EXAM: RIGHT TIBIA AND FIBULA - 2 VIEW COMPARISON:  None Available. FINDINGS: There is no evidence of fracture or other focal bone lesions. Mild to moderate severity focal soft tissue swelling is seen along the medial aspect of the distal right tibial shaft. IMPRESSION: Focal soft tissue swelling along the medial aspect of the distal right tibial shaft. Electronically Signed   By: Suzen Dials M.D.   On: 12/20/2023 22:19   DG Hip Port [place] W or [place] Pelvis 1 View Left Result Date: 12/20/2023 CLINICAL DATA:  Status post fall. EXAM: DG HIP (WITH OR WITHOUT PELVIS) 1V PORT LEFT COMPARISON:  None Available. FINDINGS: There is no evidence of an acute hip fracture or dislocation. Mild degenerative changes are seen in the form of joint space narrowing and acetabular sclerosis. IMPRESSION: Mild degenerative changes without evidence of an acute fracture or dislocation. Electronically Signed   By: Suzen Dials M.D.   On: 12/20/2023 22:18   CT HEAD WO CONTRAST ( ) Result Date: 12/20/2023 CLINICAL DATA:  Polytrauma, blunt fall;  Polytrauma, blunt EXAM: CT HEAD WITHOUT CONTRAST CT CERVICAL SPINE WITHOUT CONTRAST TECHNIQUE: Multidetector CT imaging of the head and cervical spine was performed following the standard protocol without intravenous contrast. Multiplanar CT image reconstructions of the cervical spine were also generated. RADIATION DOSE REDUCTION: This exam was performed according to the departmental dose-optimization program which includes automated exposure control, adjustment of the mA and/or kV according to patient size and/or use of iterative reconstruction technique. COMPARISON:  None Available. FINDINGS: CT HEAD FINDINGS Brain: Cerebral ventricle sizes are concordant with the degree of cerebral volume loss. Patchy and confluent areas of decreased attenuation are noted throughout the deep and periventricular white matter of the cerebral hemispheres bilaterally, compatible with chronic microvascular ischemic disease. Scattered cerebral parenchyma calcifications. No evidence of large-territorial acute infarction. No parenchymal hemorrhage. No mass lesion. No extra-axial collection. No mass effect or midline shift. No hydrocephalus. Basilar cisterns are patent. Vascular: No hyperdense vessel. Skull: No acute fracture or focal lesion. Sinuses/Orbits: Almost complete opacification of left maxillary sinus. Otherwise paranasal sinuses and mastoid air cells are clear. Bilateral lens replacement. Otherwise the orbits are unremarkable. Other: None. CT CERVICAL SPINE FINDINGS Alignment: Grade 1 anterolisthesis of C4 on C5. Skull base and vertebrae: C7-T1 anterior cervical discectomy and fusion surgical hardware. Multilevel moderate severe degenerative changes of the spine. No associated severe osseous central canal stenosis. Associated moderate to severe left C5-C6 osseous neural foraminal stenosis. No acute fracture. No aggressive appearing focal osseous lesion or focal pathologic process. Soft tissues and spinal canal: No prevertebral  fluid or swelling. No visible canal hematoma. Upper chest: Unremarkable. Other: Atherosclerotic plaque. IMPRESSION: 1. No  acute intracranial abnormality. 2. No acute displaced fracture or traumatic listhesis of the cervical spine. 3. Scattered cerebral parenchyma calcifications. Findings likely sequelae of prior infection/inflammation. 4.  Aortic Atherosclerosis (ICD10-I70.0). Electronically Signed   By: Morgane  Naveau M.D.   On: 12/20/2023 22:05   CT Cervical Spine Wo Contrast Result Date: 12/20/2023 CLINICAL DATA:  Polytrauma, blunt fall; Polytrauma, blunt EXAM: CT HEAD WITHOUT CONTRAST CT CERVICAL SPINE WITHOUT CONTRAST TECHNIQUE: Multidetector CT imaging of the head and cervical spine was performed following the standard protocol without intravenous contrast. Multiplanar CT image reconstructions of the cervical spine were also generated. RADIATION DOSE REDUCTION: This exam was performed according to the departmental dose-optimization program which includes automated exposure control, adjustment of the mA and/or kV according to patient size and/or use of iterative reconstruction technique. COMPARISON:  None Available. FINDINGS: CT HEAD FINDINGS Brain: Cerebral ventricle sizes are concordant with the degree of cerebral volume loss. Patchy and confluent areas of decreased attenuation are noted throughout the deep and periventricular white matter of the cerebral hemispheres bilaterally, compatible with chronic microvascular ischemic disease. Scattered cerebral parenchyma calcifications. No evidence of large-territorial acute infarction. No parenchymal hemorrhage. No mass lesion. No extra-axial collection. No mass effect or midline shift. No hydrocephalus. Basilar cisterns are patent. Vascular: No hyperdense vessel. Skull: No acute fracture or focal lesion. Sinuses/Orbits: Almost complete opacification of left maxillary sinus. Otherwise paranasal sinuses and mastoid air cells are clear. Bilateral lens replacement.  Otherwise the orbits are unremarkable. Other: None. CT CERVICAL SPINE FINDINGS Alignment: Grade 1 anterolisthesis of C4 on C5. Skull base and vertebrae: C7-T1 anterior cervical discectomy and fusion surgical hardware. Multilevel moderate severe degenerative changes of the spine. No associated severe osseous central canal stenosis. Associated moderate to severe left C5-C6 osseous neural foraminal stenosis. No acute fracture. No aggressive appearing focal osseous lesion or focal pathologic process. Soft tissues and spinal canal: No prevertebral fluid or swelling. No visible canal hematoma. Upper chest: Unremarkable. Other: Atherosclerotic plaque. IMPRESSION: 1. No acute intracranial abnormality. 2. No acute displaced fracture or traumatic listhesis of the cervical spine. 3. Scattered cerebral parenchyma calcifications. Findings likely sequelae of prior infection/inflammation. 4.  Aortic Atherosclerosis (ICD10-I70.0). Electronically Signed   By: Morgane  Naveau M.D.   On: 12/20/2023 22:05    Scheduled Meds:    acetaminophen   500 mg Oral Q6H   [START ON 12/22/2023] amiodarone   200 mg Oral Daily   [START ON 12/22/2023] diltiazem   120 mg Oral Daily   docusate sodium   100 mg Oral BID   [START ON 12/22/2023] docusate sodium   200 mg Oral Daily   [START ON 12/22/2023] enoxaparin  (LOVENOX ) injection  40 mg Subcutaneous Q24H   feeding supplement  237 mL Oral BID BM   [START ON 12/22/2023] fesoterodine   4 mg Oral Daily   [START ON 12/22/2023] furosemide   20 mg Oral Daily   HYDROcodone -acetaminophen        levothyroxine   125 mcg Oral QAC breakfast   [START ON 12/22/2023] multivitamin with minerals  1 tablet Oral Daily   mupirocin  ointment  1 Application Nasal BID   pantoprazole   40 mg Oral Daily   [START ON 12/22/2023] polyethylene glycol  17 g Oral Daily   [START ON 12/22/2023] spironolactone   12.5 mg Oral Daily    Continuous Infusions:     ceFAZolin  (ANCEF ) IV       LOS: 0 days     Trenda Mar, MD,  FACP, [facility],  [facility], [facility]   [facility]  Hospitalist & Physician Advisor [facility]  To contact the attending provider between 7A-7P or the covering provider during after hours 7P-7A, please log into the web site [email or web address] and access using universal [place] password for that web site. If you do not have the password, please call the hospital operator.  12/21/2023, 4:27 PM

## 2023-12-21 NOTE — Assessment & Plan Note (Signed)
 Verified that pt wants to be DNR/DNI. She states she has a yellow DNR form attached to her refrigerator at home.

## 2023-12-21 NOTE — Assessment & Plan Note (Signed)
 Replete with IV Kcl since pt is NPO and lying supine.

## 2023-12-21 NOTE — Assessment & Plan Note (Signed)
 Admit to med/surg bed. NPO. IV dilaudid  prn for pain. Pt takes hydrocodone  on a chronic basis.

## 2023-12-21 NOTE — Progress Notes (Signed)
 Initial Nutrition Assessment  INTERVENTION:   Once diet advanced: -Ensure Plus High Protein po BID, each supplement provides 350 kcal and 20 grams of protein.  -Multivitamin with minerals daily  NUTRITION DIAGNOSIS:   Increased nutrient needs related to hip fracture, post-op healing as evidenced by estimated needs.  GOAL:   Patient will meet greater than or equal to 90% of their needs  MONITOR:   PO intake, Supplement acceptance  REASON FOR ASSESSMENT:   Consult Hip fracture protocol  ASSESSMENT:   88 y.o. female with history of paroxysmal A-fib, chronic diastolic heart failure, history of DVT on Coumadin, hypertension, hypothyroidism, presents to the ER today after mechanical fall at home. Admitted for acute left femoral neck fracture.  Patient currently NPO, out of room for surgery today. Plan is for percutaneous fixation of left femoral fracture. Pt with recent admission in January, at that time RD ordered Ensure and MVI daily, will resume these orders to help with post-op healing.   Per weight records, pt has lost 13 lbs since 03/08/23 (12% wt loss x 9.5 months, insignificant for time frame).  Medications: Colace, Lasix , Miralax , Aldactone , Lactated ringers , IV KCl, Zofran   Labs reviewed.  NUTRITION - FOCUSED PHYSICAL EXAM:  Unable to complete, working remotely.  Diet Order:   Diet Order             Diet NPO time specified  Diet effective midnight           Diet NPO time specified Except for: Ice Chips, Sips with Meds  Diet effective now                   EDUCATION NEEDS:   Not appropriate for education at this time  Skin:  Skin Assessment: Skin Integrity Issues: Skin Integrity Issues:: Other (Comment) Other: hematoma of lower right leg  Last BM:  2/7  Height:   Ht Readings from Last 1 Encounters:  12/21/23 4' 11.02 (1.499 m)    Weight:   Wt Readings from Last 1 Encounters:  12/21/23 42.2 kg    BMI:  Body mass index is 18.77  kg/m.  Estimated Nutritional Needs:   Kcal:  1250-1450  Protein:  60-70g  Fluid:  1.5L/day   Morna Lee, MS, RD, LDN Inpatient Clinical Dietitian Contact via Secure chat

## 2023-12-21 NOTE — Anesthesia Preprocedure Evaluation (Addendum)
 Anesthesia Evaluation  Patient identified by MRN, date of birth, ID band Patient awake    Reviewed: Allergy & Precautions, NPO status , Patient's Chart, lab work & pertinent test results  Airway Mallampati: II  TM Distance: >3 FB Neck ROM: Full    Dental  (+) Dental Advisory Given, Teeth Intact   Pulmonary shortness of breath   Pulmonary exam normal breath sounds clear to auscultation       Cardiovascular hypertension, Pt. on medications pulmonary hypertension+CHF  + Valvular Problems/Murmurs (Severe TR) MR and AI  Rhythm:Irregular Rate:Normal + Systolic murmurs Echo 05/2022  1. Left ventricular ejection fraction, by estimation, is 60 to 65%. The left ventricle has normal function. The left ventricle has no regional wall motion abnormalities. There is severe asymmetric left ventricular hypertrophy of the basal-septal segment. Left ventricular diastolic parameters are indeterminate.   2. There is systolic anterior motion of the mitral valve but most of MR is related to atrial dilation and bi-leaflet prolapse.   3. Right ventricular systolic function is normal. The right ventricular size is severely enlarged. There is severely elevated pulmonary artery systolic pressure. The estimated right ventricular systolic pressure is 74.9 mmHg.   4. Left atrial size was severely dilated.   5. Right atrial size was severely dilated.   6. The mitral valve is abnormal. Severe mitral valve regurgitation.   7. Tricuspid valve regurgitation is severe.   8. The aortic valve is tricuspid. There is mild calcification of the aortic valve. Aortic valve regurgitation is mild to moderate. No aortic stenosis is present.   9. Aortic dilatation noted. There is severe dilatation of the ascending aorta, measuring 50 mm. There is moderate dilatation of the aortic arch, measuring 45 mm.   Comparison(s): Further aortic dilation, worsening tricuspid and mitral  regurgitation.      Neuro/Psych  PSYCHIATRIC DISORDERS Anxiety      Neuromuscular disease    GI/Hepatic Neg liver ROS, hiatal hernia,GERD  ,,  Endo/Other  Hypothyroidism    Renal/GU negative Renal ROS     Musculoskeletal  (+)  Fibromyalgia -  Abdominal   Peds  Hematology negative hematology ROS (+)   Anesthesia Other Findings   Reproductive/Obstetrics                             Anesthesia Physical Anesthesia Plan  ASA: 4  Anesthesia Plan: General   Post-op Pain Management: Ofirmev  IV (intra-op)*   Induction: Intravenous  PONV Risk Score and Plan: 4 or greater and Ondansetron , Dexamethasone  and Treatment may vary due to age or medical condition  Airway Management Planned: Oral ETT  Additional Equipment: Arterial line  Intra-op Plan:   Post-operative Plan: Extubation in OR  Informed Consent: I have reviewed the patients History and Physical, chart, labs and discussed the procedure including the risks, benefits and alternatives for the proposed anesthesia with the patient or authorized representative who has indicated his/her understanding and acceptance.   Patient has DNR.   Dental advisory given  Plan Discussed with: CRNA  Anesthesia Plan Comments:         Anesthesia Quick Evaluation

## 2023-12-21 NOTE — Progress Notes (Signed)
 * Day of Surgery *  Subjective: Patient seen this afternoon postop. Reports pain everywhere but cannot identify any specific locations. Alert and oriented.   Objective: Vital signs in last 24 hours: Temp:  [97.5 F (36.4 C)-98.1 F (36.7 C)] 97.6 F (36.4 C) (02/08 1610) Pulse Rate:  [63-84] 70 (02/08 1610) Resp:  [10-22] 16 (02/08 1610) BP: (81-133)/(62-85) 100/74 (02/08 1610) SpO2:  [91 %-100 %] 100 % (02/08 1147) Weight:  [42.2 kg] 42.2 kg (02/08 0743) Last BM Date : 12/20/23  Intake/Output from previous day: 02/07 0701 - 02/08 0700 In: 1000 [IV Piggyback:1000] Out: -  Intake/Output this shift: Total I/O In: 913 [I.V.:400; IV Piggyback:513] Out: 50 [Blood:50]  PE: General: resting comfortably, NAD Neuro: alert and oriented, no focal deficits Resp: normal work of breathing on room air Abdomen: soft, nondistended Extremities: warm and well-perfused   Lab Results:  Recent Labs    12/21/23 0705 12/21/23 1332  WBC 19.9* 18.1*  HGB 13.1 12.4  HCT 36.7 35.7*  PLT 170 151   BMET Recent Labs    12/20/23 2220 12/21/23 0705  NA 138 136  K 3.3* 4.1  CL 98 101  CO2 25 22  GLUCOSE 141* 122*  BUN 26* 26*  CREATININE 0.97 0.90  CALCIUM  9.2 8.4*   PT/INR No results for input(s): LABPROT, INR in the last 72 hours. CMP     Component Value Date/Time   NA 136 12/21/2023 0705   NA 137 06/04/2023 1150   K 4.1 12/21/2023 0705   CL 101 12/21/2023 0705   CO2 22 12/21/2023 0705   GLUCOSE 122 (H) 12/21/2023 0705   BUN 26 (H) 12/21/2023 0705   BUN 38 (H) 06/04/2023 1150   CREATININE 0.90 12/21/2023 0705   CREATININE 0.90 03/04/2015 1014   CALCIUM  8.4 (L) 12/21/2023 0705   PROT 6.0 (L) 12/21/2023 0705   PROT 6.3 01/09/2023 1201   ALBUMIN  3.3 (L) 12/21/2023 0705   ALBUMIN  4.2 01/09/2023 1201   AST 49 (H) 12/21/2023 0705   ALT 60 (H) 12/21/2023 0705   ALKPHOS 46 12/21/2023 0705   BILITOT 0.9 12/21/2023 0705   BILITOT 0.4 01/09/2023 1201   GFRNONAA  >60 12/21/2023 0705   GFRAA 90 08/06/2018 1326   Lipase     Component Value Date/Time   LIPASE 43 11/22/2022 1107       Studies/Results: DG FEMUR MIN 2 VIEWS LEFT Result Date: 12/21/2023 CLINICAL DATA:  Status post repair of hip fracture. EXAM: LEFT FEMUR 2 VIEWS COMPARISON:  Preoperative imaging. FINDINGS: Three screws traverse left femoral neck fracture. Stable fracture alignment. Recent postsurgical change includes air and edema in the soft tissues. The distal femur is intact. IMPRESSION: ORIF left proximal femur fracture. Electronically Signed   By: Andrea Gasman M.D.   On: 12/21/2023 11:16   DG FEMUR MIN 2 VIEWS LEFT Result Date: 12/21/2023 CLINICAL DATA:  Elective surgery. EXAM: LEFT FEMUR 2 VIEWS COMPARISON:  Radiograph yesterday FINDINGS: Four fluoroscopic spot views of the left proximal femur submitted from the operating room. Pinning of femoral neck fracture. Fluoroscopy time 140.8 seconds. Dose 22.65 mGy. IMPRESSION: Procedural fluoroscopy during left femoral neck fracture pinning. Electronically Signed   By: Andrea Gasman M.D.   On: 12/21/2023 11:15   DG C-Arm 1-60 Min-No Report Result Date: 12/21/2023 Fluoroscopy was utilized by the requesting physician.  No radiographic interpretation.   DG C-Arm 1-60 Min-No Report Result Date: 12/21/2023 Fluoroscopy was utilized by the requesting physician.  No radiographic interpretation.  CT T-SPINE NO CHARGE Result Date: 12/20/2023 CLINICAL DATA:  fall EXAM: CT Thoracic and Lumbar spine without contrast TECHNIQUE: Multiplanar CT images of the thoracic and lumbar spine were reconstructed from contemporary CT of the Chest, Abdomen, and Pelvis. RADIATION DOSE REDUCTION: This exam was performed according to the departmental dose-optimization program which includes automated exposure control, adjustment of the mA and/or kV according to patient size and/or use of iterative reconstruction technique. CONTRAST:  None or No additional COMPARISON:   None Available. FINDINGS: CT THORACIC SPINE FINDINGS Alignment: Normal. Vertebrae: Diffusely decreased bone density. C7-T1 anterior cervical discectomy and fusion surgical hardware. Interval worsening of T10 superior endplate compression fracture with 40% vertebral body height. T8, T9, T11, T12 chronic stable fractures. Kyphoplasty of T11. No acute fracture or focal pathologic process. Paraspinal and other soft tissues: Negative. Disc levels: Multilevel severe intervertebral disc space narrowing. CT LUMBAR SPINE FINDINGS Segmentation: 5 lumbar type vertebrae. Alignment: Normal. Vertebrae: Diffusely decreased bone density. Chronic stable L1, L2, L3, L4 fractures status post L1 and L4 kyphoplasty. No acute fracture or focal pathologic process. Paraspinal and other soft tissues: Negative. Disc levels: Multilevel intervertebral disc space vacuum phenomenon. IMPRESSION: 1. Interval worsening of T10 superior endplate compression fracture with 40% vertebral body height. Correlate with point tenderness to palpation for an acute component. 2. Multilevel chronic stable thoracolumbar fractures. 3. Diffusely decreased bone density. Electronically Signed   By: Morgane  Naveau M.D.   On: 12/20/2023 22:43   CT L-SPINE NO CHARGE Result Date: 12/20/2023 CLINICAL DATA:  fall EXAM: CT Thoracic and Lumbar spine without contrast TECHNIQUE: Multiplanar CT images of the thoracic and lumbar spine were reconstructed from contemporary CT of the Chest, Abdomen, and Pelvis. RADIATION DOSE REDUCTION: This exam was performed according to the departmental dose-optimization program which includes automated exposure control, adjustment of the mA and/or kV according to patient size and/or use of iterative reconstruction technique. CONTRAST:  None or No additional COMPARISON:  None Available. FINDINGS: CT THORACIC SPINE FINDINGS Alignment: Normal. Vertebrae: Diffusely decreased bone density. C7-T1 anterior cervical discectomy and fusion surgical  hardware. Interval worsening of T10 superior endplate compression fracture with 40% vertebral body height. T8, T9, T11, T12 chronic stable fractures. Kyphoplasty of T11. No acute fracture or focal pathologic process. Paraspinal and other soft tissues: Negative. Disc levels: Multilevel severe intervertebral disc space narrowing. CT LUMBAR SPINE FINDINGS Segmentation: 5 lumbar type vertebrae. Alignment: Normal. Vertebrae: Diffusely decreased bone density. Chronic stable L1, L2, L3, L4 fractures status post L1 and L4 kyphoplasty. No acute fracture or focal pathologic process. Paraspinal and other soft tissues: Negative. Disc levels: Multilevel intervertebral disc space vacuum phenomenon. IMPRESSION: 1. Interval worsening of T10 superior endplate compression fracture with 40% vertebral body height. Correlate with point tenderness to palpation for an acute component. 2. Multilevel chronic stable thoracolumbar fractures. 3. Diffusely decreased bone density. Electronically Signed   By: Morgane  Naveau M.D.   On: 12/20/2023 22:43   CT CHEST ABDOMEN PELVIS WO CONTRAST Result Date: 12/20/2023 CLINICAL DATA:  Polytrauma, blunt fall, back pain, hlp pain. hit head. eliquis  use. EXAM: CT CHEST, ABDOMEN AND PELVIS WITHOUT CONTRAST TECHNIQUE: Multidetector CT imaging of the chest, abdomen and pelvis was performed following the standard protocol without IV contrast. RADIATION DOSE REDUCTION: This exam was performed according to the departmental dose-optimization program which includes automated exposure control, adjustment of the mA and/or kV according to patient size and/or use of iterative reconstruction technique. COMPARISON:  CT chest abdomen pelvis 11/22/2022, CT pelvis 06/08/2011, CT abdomen pelvis 05/30/2017  FINDINGS: CHEST: Cardiovascular: Stable aneurysmal ascending thoracic aorta measuring up to 4.6 cm. The heart is normal in size. No significant pericardial effusion. Severe atherosclerotic plaque. Mitral annular  calcification. Coronary artery calcification. Lungs/Pleura: Left lower lobe passive atelectasis. No focal consolidation. No pulmonary nodule. No pulmonary mass. No pulmonary contusion or laceration. No pneumatocele formation. No pleural effusion. No pneumothorax. No hemothorax. Mediastinum/Nodes: No pneumomediastinum. The central airways are patent. The esophagus is unremarkable. Stable large hiatal hernia containing the entire stomach. The thyroid  is unremarkable. Limited evaluation for hilar lymphadenopathy on this noncontrast study. No mediastinal or axillary lymphadenopathy. Musculoskeletal/Chest wall No chest wall mass. Diffusely decreased bone density. Old healed right rib fractures. No acute rib or sternal fracture. Please see separately dictated CT thoracolumbar spine 12/20/2023. ABDOMEN / PELVIS: Hepatobiliary: Not enlarged. Hyperdense hepatic parenchyma.  No focal lesion. The gallbladder is otherwise unremarkable with no radio-opaque gallstones. No biliary ductal dilatation. Pancreas: Normal pancreatic contour. No main pancreatic duct dilatation. Spleen: Not enlarged. No focal lesion. Adrenals/Urinary Tract: No nodularity bilaterally. No hydroureteronephrosis. No nephroureterolithiasis. No contour deforming renal mass. The urinary bladder is unremarkable. Stomach/Bowel: No small or large bowel wall thickening or dilatation. Stool throughout the majority of the colon. The appendix is unremarkable. Vasculature/Lymphatic: Moderate atherosclerotic plaque. No abdominal aorta or iliac aneurysm. No abdominal, pelvic, inguinal lymphadenopathy. Reproductive: Normal. Other: No simple free fluid ascites. No pneumoperitoneum. No mesenteric hematoma identified. No organized fluid collection. Musculoskeletal: No significant soft tissue hematoma. Persistent densely sclerotic right iliac bone lesion. Diffusely decreased bone density. Acute impacted left femoral neck fracture. Please see separately dictated CT  thoracolumbar spine 12/20/2023. Ports and Devices: None. IMPRESSION: 1. No acute intrathoracic, intra-abdominal, intrapelvic traumatic injury with limited evaluation on this noncontrast study. 2. Please see separately dictated CT thoracolumbar spine 12/20/2023. 3. Stable aneurysmal ascending thoracic aorta (4.6 cm). Ascending thoracic aortic aneurysm. Recommend semi-annual imaging followup by CTA or MRA and referral to cardiothoracic surgery if not already obtained. This recommendation follows 2010 ACCF/AHA/AATS/ACR/ASA/SCA/SCAI/SIR/STS/SVM Guidelines for the Diagnosis and Management of Patients With Thoracic Aortic Disease. Circulation. 2010; 121: [id number]. Aortic aneurysm NOS (ICD10-I71.9). Aortic aneurysm NOS (ICD10-I71.9). 4. Aortic Atherosclerosis (ICD10-I70.0) including coronary artery and mitral annular calcification. 5. Acute impacted left femoral neck fracture. 6. Constipation. Electronically Signed   By: Morgane  Naveau M.D.   On: 12/20/2023 22:29   DG Ankle Complete Right Result Date: 12/20/2023 CLINICAL DATA:  Status post fall. EXAM: RIGHT ANKLE - COMPLETE 3+ VIEW COMPARISON:  None Available. FINDINGS: There is no evidence of fracture, dislocation, or joint effusion. There is no evidence of arthropathy or other focal bone abnormality. Moderate severity soft tissue swelling is seen along the medial aspect of the distal right tibial shaft. IMPRESSION: Medial soft tissue swelling without evidence of acute fracture or dislocation. Electronically Signed   By: Suzen Dials M.D.   On: 12/20/2023 22:21   DG Tibia/Fibula Right Result Date: 12/20/2023 CLINICAL DATA:  Status post fall. EXAM: RIGHT TIBIA AND FIBULA - 2 VIEW COMPARISON:  None Available. FINDINGS: There is no evidence of fracture or other focal bone lesions. Mild to moderate severity focal soft tissue swelling is seen along the medial aspect of the distal right tibial shaft. IMPRESSION: Focal soft tissue swelling along the medial aspect of  the distal right tibial shaft. Electronically Signed   By: Suzen Dials M.D.   On: 12/20/2023 22:19   DG Hip [place] or [place] Pelvis 1 View Left Result Date: 12/20/2023 CLINICAL DATA:  Status post fall. EXAM: DG  HIP (WITH OR WITHOUT PELVIS) 1V PORT LEFT COMPARISON:  None Available. FINDINGS: There is no evidence of an acute hip fracture or dislocation. Mild degenerative changes are seen in the form of joint space narrowing and acetabular sclerosis. IMPRESSION: Mild degenerative changes without evidence of an acute fracture or dislocation. Electronically Signed   By: Suzen Dials M.D.   On: 12/20/2023 22:18   CT HEAD WO CONTRAST ( ) Result Date: 12/20/2023 CLINICAL DATA:  Polytrauma, blunt fall; Polytrauma, blunt EXAM: CT HEAD WITHOUT CONTRAST CT CERVICAL SPINE WITHOUT CONTRAST TECHNIQUE: Multidetector CT imaging of the head and cervical spine was performed following the standard protocol without intravenous contrast. Multiplanar CT image reconstructions of the cervical spine were also generated. RADIATION DOSE REDUCTION: This exam was performed according to the departmental dose-optimization program which includes automated exposure control, adjustment of the mA and/or kV according to patient size and/or use of iterative reconstruction technique. COMPARISON:  None Available. FINDINGS: CT HEAD FINDINGS Brain: Cerebral ventricle sizes are concordant with the degree of cerebral volume loss. Patchy and confluent areas of decreased attenuation are noted throughout the deep and periventricular white matter of the cerebral hemispheres bilaterally, compatible with chronic microvascular ischemic disease. Scattered cerebral parenchyma calcifications. No evidence of large-territorial acute infarction. No parenchymal hemorrhage. No mass lesion. No extra-axial collection. No mass effect or midline shift. No hydrocephalus. Basilar cisterns are patent. Vascular: No hyperdense vessel. Skull: No acute fracture or  focal lesion. Sinuses/Orbits: Almost complete opacification of left maxillary sinus. Otherwise paranasal sinuses and mastoid air cells are clear. Bilateral lens replacement. Otherwise the orbits are unremarkable. Other: None. CT CERVICAL SPINE FINDINGS Alignment: Grade 1 anterolisthesis of C4 on C5. Skull base and vertebrae: C7-T1 anterior cervical discectomy and fusion surgical hardware. Multilevel moderate severe degenerative changes of the spine. No associated severe osseous central canal stenosis. Associated moderate to severe left C5-C6 osseous neural foraminal stenosis. No acute fracture. No aggressive appearing focal osseous lesion or focal pathologic process. Soft tissues and spinal canal: No prevertebral fluid or swelling. No visible canal hematoma. Upper chest: Unremarkable. Other: Atherosclerotic plaque. IMPRESSION: 1. No acute intracranial abnormality. 2. No acute displaced fracture or traumatic listhesis of the cervical spine. 3. Scattered cerebral parenchyma calcifications. Findings likely sequelae of prior infection/inflammation. 4.  Aortic Atherosclerosis (ICD10-I70.0). Electronically Signed   By: Morgane  Naveau M.D.   On: 12/20/2023 22:05   CT Cervical Spine Wo Contrast Result Date: 12/20/2023 CLINICAL DATA:  Polytrauma, blunt fall; Polytrauma, blunt EXAM: CT HEAD WITHOUT CONTRAST CT CERVICAL SPINE WITHOUT CONTRAST TECHNIQUE: Multidetector CT imaging of the head and cervical spine was performed following the standard protocol without intravenous contrast. Multiplanar CT image reconstructions of the cervical spine were also generated. RADIATION DOSE REDUCTION: This exam was performed according to the departmental dose-optimization program which includes automated exposure control, adjustment of the mA and/or kV according to patient size and/or use of iterative reconstruction technique. COMPARISON:  None Available. FINDINGS: CT HEAD FINDINGS Brain: Cerebral ventricle sizes are concordant with the  degree of cerebral volume loss. Patchy and confluent areas of decreased attenuation are noted throughout the deep and periventricular white matter of the cerebral hemispheres bilaterally, compatible with chronic microvascular ischemic disease. Scattered cerebral parenchyma calcifications. No evidence of large-territorial acute infarction. No parenchymal hemorrhage. No mass lesion. No extra-axial collection. No mass effect or midline shift. No hydrocephalus. Basilar cisterns are patent. Vascular: No hyperdense vessel. Skull: No acute fracture or focal lesion. Sinuses/Orbits: Almost complete opacification of left maxillary sinus. Otherwise paranasal sinuses and  mastoid air cells are clear. Bilateral lens replacement. Otherwise the orbits are unremarkable. Other: None. CT CERVICAL SPINE FINDINGS Alignment: Grade 1 anterolisthesis of C4 on C5. Skull base and vertebrae: C7-T1 anterior cervical discectomy and fusion surgical hardware. Multilevel moderate severe degenerative changes of the spine. No associated severe osseous central canal stenosis. Associated moderate to severe left C5-C6 osseous neural foraminal stenosis. No acute fracture. No aggressive appearing focal osseous lesion or focal pathologic process. Soft tissues and spinal canal: No prevertebral fluid or swelling. No visible canal hematoma. Upper chest: Unremarkable. Other: Atherosclerotic plaque. IMPRESSION: 1. No acute intracranial abnormality. 2. No acute displaced fracture or traumatic listhesis of the cervical spine. 3. Scattered cerebral parenchyma calcifications. Findings likely sequelae of prior infection/inflammation. 4.  Aortic Atherosclerosis (ICD10-I70.0). Electronically Signed   By: Morgane  Naveau M.D.   On: 12/20/2023 22:05     Assessment/Plan 88 yo female s/p mechanical fall. - L femur fracture: s/p operative fixation today Dr. Cristy.  - ?Acute on chronic T10 compression fracture: Not reporting specific pain in back, recommend  consulting neurosurgery if any symptoms or concern for acute fracture. - Diet as tolerated - Remainder of care per primary team. Trauma will sign off, please call with any further questions or concerns.    LOS: 0 days    Leonor Dawn, MD [facility], Hepatobiliary and Pancreatic Surgery 12/21/23 4:20 PM

## 2023-12-21 NOTE — Assessment & Plan Note (Signed)
 On cardizem  cd 120 mg daily, aldactone  12.5 mg daily, lasix  20 mg daily.

## 2023-12-21 NOTE — Assessment & Plan Note (Signed)
Continue synthroid 125 mcg daily.

## 2023-12-21 NOTE — Anesthesia Procedure Notes (Signed)
 Procedure Name: Intubation Date/Time: 12/21/2023 8:58 AM  Performed by: Claudene Arlin LABOR, CRNAPre-anesthesia Checklist: Patient identified, Emergency Drugs available, Suction available and Patient being monitored Patient Re-evaluated:Patient Re-evaluated prior to induction Oxygen Delivery Method: Circle system utilized Preoxygenation: Pre-oxygenation with 100% oxygen Induction Type: IV induction Ventilation: Mask ventilation without difficulty Laryngoscope Size: Miller and 2 Grade View: Grade II Tube type: Oral Tube size: 6.5 mm Number of attempts: 1 Airway Equipment and Method: Stylet Placement Confirmation: ETT inserted through vocal cords under direct vision, positive ETCO2 and breath sounds checked- equal and bilateral Secured at: 21 cm Tube secured with: Tape Dental Injury: Teeth and Oropharynx as per pre-operative assessment

## 2023-12-21 NOTE — Anesthesia Postprocedure Evaluation (Signed)
 Anesthesia Post Note  Patient: [name]  Procedure(s) Performed: PERCUTANEOUS FIXATION OF FEMORAL NECK (Left: Hip)     Patient location during evaluation: PACU Anesthesia Type: General Level of consciousness: sedated and patient cooperative Pain management: pain level controlled Vital Signs Assessment: post-procedure vital signs reviewed and stable Respiratory status: spontaneous breathing Cardiovascular status: stable Anesthetic complications: no   No notable events documented.  Last Vitals:  Vitals:   12/21/23 1130 12/21/23 1147  BP: 102/70 100/75  Pulse: 63 65  Resp: 12 16  Temp: 36.6 C (!) 36.4 C  SpO2: 94% 100%    Last Pain:  Vitals:   12/21/23 1147  TempSrc: Oral  PainSc:                  [name]

## 2023-12-21 NOTE — TOC CAGE-AID Note (Signed)
 Transition of Care [facility]) - CAGE-AID Screening  Patient Details  Name: [name] MRN: [id number] Date of Birth: [birth year]  Clinical Narrative:  Patient denies any alcohol or drug use, substance abuse resources not provided at this time.  CAGE-AID Screening:   Have You Ever Felt You Ought to Cut Down on Your Drinking or Drug Use?: No Have People Annoyed You By Critizing Your Drinking Or Drug Use?: No Have You Felt Bad Or Guilty About Your Drinking Or Drug Use?: No Have You Ever Had a Drink or Used Drugs First Thing In The Morning to Steady Your Nerves or to Get Rid of a Hangover?: No CAGE-AID Score: 0  Substance Abuse Education Offered: No

## 2023-12-21 NOTE — ED Notes (Signed)
Admitting Provider at the bedside.  

## 2023-12-21 NOTE — Assessment & Plan Note (Signed)
 Continue aldactone  12.5 mg daily, lasix  20 mg daily

## 2023-12-21 NOTE — ED Provider Notes (Signed)
  Physical Exam  BP 109/78   Pulse 74   Temp 98.1 F (36.7 C) (Oral)   Resp 17   Ht 4' 11 (1.499 m)   Wt 42.2 kg   SpO2 97%   BMI 18.78 kg/m   Physical Exam  Procedures  Procedures  ED Course / MDM    Medical Decision Making Amount and/or Complexity of Data Reviewed Labs: ordered. Radiology: ordered.  Risk Prescription drug management. Decision regarding hospitalization.   Patient signed out to me from the prior ER provider pending reassessment following IV fluid resuscitation.  Patient administered 1 L IV fluid bolus, hemodynamically stable.  Previously evaluated by trauma surgery after fall, has known left femur fracture for which she will likely undergo further management.  Patient was hemodynamically stable following fluids, stable for admission to the hospitalist, Dr. Laurence accepting.       Jerrol Agent, MD 12/21/23 [phone]

## 2023-12-21 NOTE — Assessment & Plan Note (Signed)
 Hold Eliquis  for her upcoming surgery.  cont cardizem  CD 120 mg qday, amiodarone  200 mg daily.

## 2023-12-21 NOTE — Consult Note (Signed)
 ORTHOPAEDIC CONSULTATION  REQUESTING PHYSICIAN: Judeth Trenda BIRCH, MD  Chief Complaint: mechanical fall resulting in left femur fracture  HPI: [name] is a 88 y.o. female with history of paroxysmal A-fib, chronic diastolic heart failure, history of DVT on Coumadin, hypertension, hypothyroidism, presents to the ER today after mechanical fall at home. She states that she was try to place something into invigorated. Lost her grip on refrigerator and fell onto her left side resulting in acute left femoral neck fracture. Orthopedics was consulted for surgical recommendations and management.   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/20/2011   R breast DCIS, ER/PR +   Cataract 3 and 10/92   bilateral   Chronic diastolic CHF (congestive heart failure) (HCC)    Compression fracture of fourth lumbar vertebra (HCC)    DVT (deep venous thrombosis) (HCC) 08/2011   LL extremity    Fibromyalgia    Fracture lumbar vertebra-closed (HCC)    Fracture of thoracic vertebra, closed (HCC)    GERD (gastroesophageal reflux disease)    H/O hiatal hernia    History of blood clots    History of radiation therapy 01/2012   R breast   Hypercholesteremia    Hypertension    DR JINNY BUDGE   Hypothyroidism    Mitral regurgitation    Mitral valve prolapse    Osteoporosis    PAF (paroxysmal atrial fibrillation) (HCC)    a. dx 11/2016.   Pulmonary hypertension (HCC)    Rib fractures    Thoracic ascending aortic aneurysm (HCC)    a. followed by Dr. Fleeta Ochoa.   Tricuspid regurgitation    Past Surgical History:  Procedure Laterality Date   ANTERIOR CERVICAL DECOMP/DISCECTOMY FUSION N/A 09/09/2015   Procedure: Anterior Cervical Decompression and Fusion Cervical seven-Thorasic one ;  Surgeon: Fairy Levels, MD;  Location: [facility];  Service: Neurosurgery;  Laterality: N/A;   APPENDECTOMY  1940   BREAST SURGERY     CATARACT  EXTRACTION  1992   EYE SURGERY  1940, 1956   HERNIA REPAIR  10/16/2006   [facility] - Dr Gladis   KYPHOPLASTY N/A 01/26/2013   Procedure: KYPHOPLASTY;  Surgeon: Victory Gens, MD;  Location: [facility];  Service: Neurosurgery;  Laterality: N/A;  T11 and L1   KYPHOPLASTY N/A 06/21/2017   Procedure: Lumbar four Kyphoplasty;  Surgeon: Levels Fairy, MD;  Location: [facility];  Service: Neurosurgery;  Laterality: N/A;   MASTECTOMY, PARTIAL  10/17/2011   Procedure: MASTECTOMY PARTIAL;  Surgeon: Sherlean JINNY Laughter, MD;  Location: [facility] OR;  Service: General;  Laterality: Right;  needle guided   TEE WITHOUT CARDIOVERSION N/A 12/04/2016   Procedure: TRANSESOPHAGEAL ECHOCARDIOGRAM (TEE);  Surgeon: Vinie JAYSON Maxcy, MD;  Location: [facility] ENDOSCOPY;  Service: Cardiovascular;  Laterality: N/A;   TONSILLECTOMY  1944   Social History   Socioeconomic History   Marital status: Widowed    Spouse name: Not on file   Number of children: Not on file   Years of education: Not on file   Highest education level: Not on file  Occupational History   Not on file  Tobacco Use   Smoking status: Never   Smokeless tobacco: Never  Vaping Use   Vaping status: Never Used  Substance and Sexual Activity   Alcohol use: No   Drug use: No   Sexual activity: Never  Other Topics Concern   Not on  file  Social History Narrative   Are you right handed or left handed? Right   Are you currently employed ?    What is your current occupation? retired   Do you live at home alone? Yes but has a care giver,5 days a week   Who lives with you?    What type of home do you live in: 1 story or 2 story? one    Caffeine  1 soda aday   Social Drivers of Health   Financial Resource Strain: Not on file  Food Insecurity: No Food Insecurity (12/21/2023)   Hunger Vital Sign    Worried About Running Out of Food in the Last Year: Never true    Ran Out of Food in the Last Year: Never true  Transportation Needs: No Transportation Needs (12/21/2023)   PRAPARE  - Administrator, Civil Service (Medical): No    Lack of Transportation (Non-Medical): No  Physical Activity: Not on file  Stress: Not on file  Social Connections: Unknown (12/21/2023)   Social Connection and Isolation Panel [NHANES]    Frequency of Communication with Friends and Family: Never    Frequency of Social Gatherings with Friends and Family: Never    Attends Religious Services: Never    Database Administrator or Organizations: No    Attends Engineer, Structural: Never    Marital Status: Not on file   Family History  Problem Relation Age of Onset   Heart disease Mother    Heart disease Maternal Uncle    Heart disease Maternal Grandfather    Allergies  Allergen Reactions   Contrast Media [Iodinated Contrast Media] Shortness Of Breath   Iohexol  Shortness Of Breath   Mucinex  [Guaifenesin  Er] Other (See Comments)    DIZZINESS   Zanaflex [Tizanidine Hcl] Anxiety and Other (See Comments)    Dizziness   Penicillins Rash    Has patient had a PCN reaction causing immediate rash, facial/tongue/throat swelling, SOB or lightheadedness with hypotension: No Has patient had a PCN reaction causing severe rash involving mucus membranes or skin necrosis: No Has patient had a PCN reaction that required hospitalization No Has patient had a PCN reaction occurring within the last 10 years: No If all of the above answers are NO, then may proceed with Cephalosporin use.    Robaxin  [Methocarbamol ] Diarrhea   Prior to Admission medications   Medication Sig Start Date End Date Taking? Authorizing Provider  amiodarone  (PACERONE ) 200 MG tablet Take 1 tablet (200 mg total) by mouth daily. 09/03/23  Yes Vannie Reche RAMAN, NP  apixaban  (ELIQUIS ) 2.5 MG TABS tablet Take 1 tablet (2.5 mg total) by mouth 2 (two) times daily. 09/03/23  Yes Vannie Reche RAMAN, NP  Ascorbic Acid (VITA-C PO) Take 1,000 mg by mouth daily.   Yes [provider]  calcium  citrate-vitamin D   (CITRACAL+D) 315-200 MG-UNIT per tablet Take 1 tablet by mouth 2 (two) times daily. 01/28/13  Yes Signa Rush, MD  diltiazem  (CARDIZEM  CD) 120 MG 24 hr capsule Take 1 capsule (120 mg total) by mouth daily. 03/08/23 12/21/23 Yes Walker, Caitlin S, NP  Docusate Calcium  (STOOL SOFTENER PO) Take 1 tablet by mouth daily.   Yes [provider]  estradiol (ESTRACE) 0.1 MG/GM vaginal cream Place 1 Applicatorful vaginally 2 (two) times a week.   Yes [provider]  furosemide  (LASIX ) 20 MG tablet Take 20 mg by mouth daily.   Yes [provider]  HYDROcodone -acetaminophen  (NORCO/VICODIN) 5-325 MG tablet Take  1-2 tablets by mouth every 6 (six) hours as needed (mild pain). Patient taking differently: Take 1-2 tablets by mouth 3 (three) times daily as needed for moderate pain (pain score 4-6). 12/03/22  Yes Fairy Frames, MD  iron  polysaccharides (NIFEREX) 150 MG capsule Take 1 capsule (150 mg total) by mouth 3 (three) times a week. M, W, F 12/03/22  Yes Fairy Frames, MD  levothyroxine  (SYNTHROID ) 125 MCG tablet Take 1 tablet (125 mcg total) by mouth daily before breakfast. 01/02/23  Yes Hobart Powell BRAVO, MD  Meclizine  HCl (BONINE PO) Take by mouth as needed.   Yes [provider]  Multiple Vitamin (MULTIVITAMIN) tablet Take 1 tablet by mouth daily with breakfast.   Yes [provider]  omeprazole  (PRILOSEC ) 20 MG capsule Take 1 capsule (20 mg total) by mouth daily. 02/15/23  Yes Wyn Jackee VEAR Mickey., NP  OVER THE COUNTER MEDICATION Take 1,000 mg by mouth daily. D-Mannose 500 mg   Yes [provider]  polyethylene glycol (MIRALAX  / GLYCOLAX ) 17 g packet Take 17 g by mouth daily.   Yes [provider]  pravastatin  (PRAVACHOL ) 20 MG tablet Take 1 tablet (20 mg total) by mouth daily with supper. 12/03/22  Yes Fairy Frames, MD  risedronate  (ACTONEL ) 150 MG tablet Take 150 mg by mouth every 30 (thirty) days. with water on empty stomach, nothing by mouth  or lie down for next 30 minutes.   Yes [provider]  solifenacin  (VESICARE ) 10 MG tablet Take 1 tablet (10 mg total) by mouth daily. Patient taking differently: Take 5 mg by mouth daily. 12/03/22  Yes Fairy Frames, MD  spironolactone  (ALDACTONE ) 25 MG tablet Take 1/2 tablet (12.5 mg total) by mouth daily. 07/16/23  Yes Lonni Slain, MD  senna-docusate (SENOKOT-S) 8.6-50 MG tablet Take 1 tablet by mouth 2 (two) times daily. Patient not taking: Reported on 12/21/2023 12/03/22   Fairy Frames, MD   CT T-SPINE NO CHARGE Result Date: 12/20/2023 CLINICAL DATA:  fall EXAM: CT Thoracic and Lumbar spine without contrast TECHNIQUE: Multiplanar CT images of the thoracic and lumbar spine were reconstructed from contemporary CT of the Chest, Abdomen, and Pelvis. RADIATION DOSE REDUCTION: This exam was performed according to the departmental dose-optimization program which includes automated exposure control, adjustment of the mA and/or kV according to patient size and/or use of iterative reconstruction technique. CONTRAST:  None or No additional COMPARISON:  None Available. FINDINGS: CT THORACIC SPINE FINDINGS Alignment: Normal. Vertebrae: Diffusely decreased bone density. C7-T1 anterior cervical discectomy and fusion surgical hardware. Interval worsening of T10 superior endplate compression fracture with 40% vertebral body height. T8, T9, T11, T12 chronic stable fractures. Kyphoplasty of T11. No acute fracture or focal pathologic process. Paraspinal and other soft tissues: Negative. Disc levels: Multilevel severe intervertebral disc space narrowing. CT LUMBAR SPINE FINDINGS Segmentation: 5 lumbar type vertebrae. Alignment: Normal. Vertebrae: Diffusely decreased bone density. Chronic stable L1, L2, L3, L4 fractures status post L1 and L4 kyphoplasty. No acute fracture or focal pathologic process. Paraspinal and other soft tissues: Negative. Disc levels: Multilevel intervertebral disc space vacuum  phenomenon. IMPRESSION: 1. Interval worsening of T10 superior endplate compression fracture with 40% vertebral body height. Correlate with point tenderness to palpation for an acute component. 2. Multilevel chronic stable thoracolumbar fractures. 3. Diffusely decreased bone density. Electronically Signed   By: Morgane  Naveau M.D.   On: 12/20/2023 22:43   CT L-SPINE NO CHARGE Result Date: 12/20/2023 CLINICAL DATA:  fall EXAM: CT Thoracic and Lumbar spine without contrast  TECHNIQUE: Multiplanar CT images of the thoracic and lumbar spine were reconstructed from contemporary CT of the Chest, Abdomen, and Pelvis. RADIATION DOSE REDUCTION: This exam was performed according to the departmental dose-optimization program which includes automated exposure control, adjustment of the mA and/or kV according to patient size and/or use of iterative reconstruction technique. CONTRAST:  None or No additional COMPARISON:  None Available. FINDINGS: CT THORACIC SPINE FINDINGS Alignment: Normal. Vertebrae: Diffusely decreased bone density. C7-T1 anterior cervical discectomy and fusion surgical hardware. Interval worsening of T10 superior endplate compression fracture with 40% vertebral body height. T8, T9, T11, T12 chronic stable fractures. Kyphoplasty of T11. No acute fracture or focal pathologic process. Paraspinal and other soft tissues: Negative. Disc levels: Multilevel severe intervertebral disc space narrowing. CT LUMBAR SPINE FINDINGS Segmentation: 5 lumbar type vertebrae. Alignment: Normal. Vertebrae: Diffusely decreased bone density. Chronic stable L1, L2, L3, L4 fractures status post L1 and L4 kyphoplasty. No acute fracture or focal pathologic process. Paraspinal and other soft tissues: Negative. Disc levels: Multilevel intervertebral disc space vacuum phenomenon. IMPRESSION: 1. Interval worsening of T10 superior endplate compression fracture with 40% vertebral body height. Correlate with point tenderness to palpation for  an acute component. 2. Multilevel chronic stable thoracolumbar fractures. 3. Diffusely decreased bone density. Electronically Signed   By: Morgane  Naveau M.D.   On: 12/20/2023 22:43   CT CHEST ABDOMEN PELVIS WO CONTRAST Result Date: 12/20/2023 CLINICAL DATA:  Polytrauma, blunt fall, back pain, hlp pain. hit head. eliquis  use. EXAM: CT CHEST, ABDOMEN AND PELVIS WITHOUT CONTRAST TECHNIQUE: Multidetector CT imaging of the chest, abdomen and pelvis was performed following the standard protocol without IV contrast. RADIATION DOSE REDUCTION: This exam was performed according to the departmental dose-optimization program which includes automated exposure control, adjustment of the mA and/or kV according to patient size and/or use of iterative reconstruction technique. COMPARISON:  CT chest abdomen pelvis 11/22/2022, CT pelvis 06/08/2011, CT abdomen pelvis 05/30/2017 FINDINGS: CHEST: Cardiovascular: Stable aneurysmal ascending thoracic aorta measuring up to 4.6 cm. The heart is normal in size. No significant pericardial effusion. Severe atherosclerotic plaque. Mitral annular calcification. Coronary artery calcification. Lungs/Pleura: Left lower lobe passive atelectasis. No focal consolidation. No pulmonary nodule. No pulmonary mass. No pulmonary contusion or laceration. No pneumatocele formation. No pleural effusion. No pneumothorax. No hemothorax. Mediastinum/Nodes: No pneumomediastinum. The central airways are patent. The esophagus is unremarkable. Stable large hiatal hernia containing the entire stomach. The thyroid  is unremarkable. Limited evaluation for hilar lymphadenopathy on this noncontrast study. No mediastinal or axillary lymphadenopathy. Musculoskeletal/Chest wall No chest wall mass. Diffusely decreased bone density. Old healed right rib fractures. No acute rib or sternal fracture. Please see separately dictated CT thoracolumbar spine 12/20/2023. ABDOMEN / PELVIS: Hepatobiliary: Not enlarged. Hyperdense  hepatic parenchyma.  No focal lesion. The gallbladder is otherwise unremarkable with no radio-opaque gallstones. No biliary ductal dilatation. Pancreas: Normal pancreatic contour. No main pancreatic duct dilatation. Spleen: Not enlarged. No focal lesion. Adrenals/Urinary Tract: No nodularity bilaterally. No hydroureteronephrosis. No nephroureterolithiasis. No contour deforming renal mass. The urinary bladder is unremarkable. Stomach/Bowel: No small or large bowel wall thickening or dilatation. Stool throughout the majority of the colon. The appendix is unremarkable. Vasculature/Lymphatic: Moderate atherosclerotic plaque. No abdominal aorta or iliac aneurysm. No abdominal, pelvic, inguinal lymphadenopathy. Reproductive: Normal. Other: No simple free fluid ascites. No pneumoperitoneum. No mesenteric hematoma identified. No organized fluid collection. Musculoskeletal: No significant soft tissue hematoma. Persistent densely sclerotic right iliac bone lesion. Diffusely decreased bone density. Acute impacted left femoral neck fracture. Please see separately  dictated CT thoracolumbar spine 12/20/2023. Ports and Devices: None. IMPRESSION: 1. No acute intrathoracic, intra-abdominal, intrapelvic traumatic injury with limited evaluation on this noncontrast study. 2. Please see separately dictated CT thoracolumbar spine 12/20/2023. 3. Stable aneurysmal ascending thoracic aorta (4.6 cm). Ascending thoracic aortic aneurysm. Recommend semi-annual imaging followup by CTA or MRA and referral to cardiothoracic surgery if not already obtained. This recommendation follows 2010 ACCF/AHA/AATS/ACR/ASA/SCA/SCAI/SIR/STS/SVM Guidelines for the Diagnosis and Management of Patients With Thoracic Aortic Disease. Circulation. 2010; 121: [id number]. Aortic aneurysm NOS (ICD10-I71.9). Aortic aneurysm NOS (ICD10-I71.9). 4. Aortic Atherosclerosis (ICD10-I70.0) including coronary artery and mitral annular calcification. 5. Acute impacted left femoral  neck fracture. 6. Constipation. Electronically Signed   By: Morgane  Naveau M.D.   On: 12/20/2023 22:29   DG Ankle Complete Right Result Date: 12/20/2023 CLINICAL DATA:  Status post fall. EXAM: RIGHT ANKLE - COMPLETE 3+ VIEW COMPARISON:  None Available. FINDINGS: There is no evidence of fracture, dislocation, or joint effusion. There is no evidence of arthropathy or other focal bone abnormality. Moderate severity soft tissue swelling is seen along the medial aspect of the distal right tibial shaft. IMPRESSION: Medial soft tissue swelling without evidence of acute fracture or dislocation. Electronically Signed   By: Suzen Dials M.D.   On: 12/20/2023 22:21   DG Tibia/Fibula Right Result Date: 12/20/2023 CLINICAL DATA:  Status post fall. EXAM: RIGHT TIBIA AND FIBULA - 2 VIEW COMPARISON:  None Available. FINDINGS: There is no evidence of fracture or other focal bone lesions. Mild to moderate severity focal soft tissue swelling is seen along the medial aspect of the distal right tibial shaft. IMPRESSION: Focal soft tissue swelling along the medial aspect of the distal right tibial shaft. Electronically Signed   By: Suzen Dials M.D.   On: 12/20/2023 22:19   DG Hip [place] or [place] Pelvis 1 View Left Result Date: 12/20/2023 CLINICAL DATA:  Status post fall. EXAM: DG HIP (WITH OR WITHOUT PELVIS) 1V PORT LEFT COMPARISON:  None Available. FINDINGS: There is no evidence of an acute hip fracture or dislocation. Mild degenerative changes are seen in the form of joint space narrowing and acetabular sclerosis. IMPRESSION: Mild degenerative changes without evidence of an acute fracture or dislocation. Electronically Signed   By: Suzen Dials M.D.   On: 12/20/2023 22:18   CT HEAD WO CONTRAST ( ) Result Date: 12/20/2023 CLINICAL DATA:  Polytrauma, blunt fall; Polytrauma, blunt EXAM: CT HEAD WITHOUT CONTRAST CT CERVICAL SPINE WITHOUT CONTRAST TECHNIQUE: Multidetector CT imaging of the head and cervical  spine was performed following the standard protocol without intravenous contrast. Multiplanar CT image reconstructions of the cervical spine were also generated. RADIATION DOSE REDUCTION: This exam was performed according to the departmental dose-optimization program which includes automated exposure control, adjustment of the mA and/or kV according to patient size and/or use of iterative reconstruction technique. COMPARISON:  None Available. FINDINGS: CT HEAD FINDINGS Brain: Cerebral ventricle sizes are concordant with the degree of cerebral volume loss. Patchy and confluent areas of decreased attenuation are noted throughout the deep and periventricular white matter of the cerebral hemispheres bilaterally, compatible with chronic microvascular ischemic disease. Scattered cerebral parenchyma calcifications. No evidence of large-territorial acute infarction. No parenchymal hemorrhage. No mass lesion. No extra-axial collection. No mass effect or midline shift. No hydrocephalus. Basilar cisterns are patent. Vascular: No hyperdense vessel. Skull: No acute fracture or focal lesion. Sinuses/Orbits: Almost complete opacification of left maxillary sinus. Otherwise paranasal sinuses and mastoid air cells are clear. Bilateral lens replacement. Otherwise the orbits  are unremarkable. Other: None. CT CERVICAL SPINE FINDINGS Alignment: Grade 1 anterolisthesis of C4 on C5. Skull base and vertebrae: C7-T1 anterior cervical discectomy and fusion surgical hardware. Multilevel moderate severe degenerative changes of the spine. No associated severe osseous central canal stenosis. Associated moderate to severe left C5-C6 osseous neural foraminal stenosis. No acute fracture. No aggressive appearing focal osseous lesion or focal pathologic process. Soft tissues and spinal canal: No prevertebral fluid or swelling. No visible canal hematoma. Upper chest: Unremarkable. Other: Atherosclerotic plaque. IMPRESSION: 1. No acute intracranial  abnormality. 2. No acute displaced fracture or traumatic listhesis of the cervical spine. 3. Scattered cerebral parenchyma calcifications. Findings likely sequelae of prior infection/inflammation. 4.  Aortic Atherosclerosis (ICD10-I70.0). Electronically Signed   By: Morgane  Naveau M.D.   On: 12/20/2023 22:05   CT Cervical Spine Wo Contrast Result Date: 12/20/2023 CLINICAL DATA:  Polytrauma, blunt fall; Polytrauma, blunt EXAM: CT HEAD WITHOUT CONTRAST CT CERVICAL SPINE WITHOUT CONTRAST TECHNIQUE: Multidetector CT imaging of the head and cervical spine was performed following the standard protocol without intravenous contrast. Multiplanar CT image reconstructions of the cervical spine were also generated. RADIATION DOSE REDUCTION: This exam was performed according to the departmental dose-optimization program which includes automated exposure control, adjustment of the mA and/or kV according to patient size and/or use of iterative reconstruction technique. COMPARISON:  None Available. FINDINGS: CT HEAD FINDINGS Brain: Cerebral ventricle sizes are concordant with the degree of cerebral volume loss. Patchy and confluent areas of decreased attenuation are noted throughout the deep and periventricular white matter of the cerebral hemispheres bilaterally, compatible with chronic microvascular ischemic disease. Scattered cerebral parenchyma calcifications. No evidence of large-territorial acute infarction. No parenchymal hemorrhage. No mass lesion. No extra-axial collection. No mass effect or midline shift. No hydrocephalus. Basilar cisterns are patent. Vascular: No hyperdense vessel. Skull: No acute fracture or focal lesion. Sinuses/Orbits: Almost complete opacification of left maxillary sinus. Otherwise paranasal sinuses and mastoid air cells are clear. Bilateral lens replacement. Otherwise the orbits are unremarkable. Other: None. CT CERVICAL SPINE FINDINGS Alignment: Grade 1 anterolisthesis of C4 on C5. Skull base  and vertebrae: C7-T1 anterior cervical discectomy and fusion surgical hardware. Multilevel moderate severe degenerative changes of the spine. No associated severe osseous central canal stenosis. Associated moderate to severe left C5-C6 osseous neural foraminal stenosis. No acute fracture. No aggressive appearing focal osseous lesion or focal pathologic process. Soft tissues and spinal canal: No prevertebral fluid or swelling. No visible canal hematoma. Upper chest: Unremarkable. Other: Atherosclerotic plaque. IMPRESSION: 1. No acute intracranial abnormality. 2. No acute displaced fracture or traumatic listhesis of the cervical spine. 3. Scattered cerebral parenchyma calcifications. Findings likely sequelae of prior infection/inflammation. 4.  Aortic Atherosclerosis (ICD10-I70.0). Electronically Signed   By: Morgane  Naveau M.D.   On: 12/20/2023 22:05   Family History Reviewed and non-contributory, no pertinent history of problems with bleeding or anesthesia      Review of Systems 14 system ROS conducted and negative except for that noted in HPI   OBJECTIVE  Vitals:Patient Vitals for the past 8 hrs:  BP Temp Temp src Pulse Resp SpO2  12/21/23 0629 107/79 -- -- 73 -- 95 %  12/21/23 0319 109/76 98.1 F (36.7 C) Oral 83 16 91 %  12/21/23 0245 97/71 -- -- 78 -- 96 %  12/21/23 0230 97/70 -- -- 78 -- 94 %  12/21/23 0215 92/69 -- -- 75 -- 95 %  12/21/23 0145 100/76 -- -- 78 -- 94 %  12/21/23 0130 94/69 -- -- 76 --  96 %  12/21/23 0100 107/77 -- -- 76 -- 94 %  12/21/23 0000 110/78 -- -- 76 -- 94 %  12/20/23 2345 109/78 98.1 F (36.7 C) Oral 74 17 97 %  12/20/23 2330 96/71 -- -- 72 -- 94 %   General: Elderly, frail, Alert, no acute distress, cachectic appearing Cardiovascular: Warm extremities noted Respiratory: No cyanosis, no use of accessory musculature GI: No organomegaly, abdomen is soft and non-tender Skin: No lesions in the area of chief complaint other than those listed below in MSK  exam.  Neurologic: Sensation intact distally save for the below mentioned MSK exam Psychiatric: Patient is competent for consent with normal mood and affect Lymphatic: No swelling obvious and reported other than the area involved in the exam below Extremities  RUE: moves freely without difficulty  LUE: moves freely without diffusely LLE: significant ecchymoses of LLE. 1+ pitting edema to mid shin. TTP over lateral and posterior left hip. Left LLE appears shortened and internally rotated. Ankle dorsi/plantar flexion intact, moves all toes without difficulty. Unable to flex upper thigh to flex at the knee 2/2 pain and known fracture. Sensation diffusely intact distally.   RLE: Ecchymoses present to mid shin. 1 + pitting edema. NVI distally.     Labs cbc Recent Labs    12/20/23 2220 12/21/23 0705  WBC 20.0* 19.9*  HGB 13.3 13.1  HCT 37.9 36.7  PLT 170 170    Labs inflam No results for input(s): CRP in the last 72 hours.  Invalid input(s): ESR  Labs coag No results for input(s): INR, PTT in the last 72 hours.  Invalid input(s): PT  Recent Labs    12/20/23 2220  NA 138  K 3.3*  CL 98  CO2 25  GLUCOSE 141*  BUN 26*  CREATININE 0.97  CALCIUM  9.2     ASSESSMENT AND PLAN: 88 y.o. female with the following: left femoral neck fracture requiring percutaneous fixation. Risks and benefits discussed with patient. She is aware of risk for non-union/malunion with potential need for hemiarthroplasty in the future. She understands this. Goal to get patient moving without pain.   This patient requires inpatient admission to manage this problem appropriately.  Orthopedics recommends admission to a medical service and we will provide consultation and follow along  - Weight Bearing Status/Activity: NWB until post-op instructions  - Additional recommended labs/tests: None  -VTE Prophylaxis: Eliquis  to be resumed 24 hour post op  - Pain control: multimodal, avoid heavily  sedating meds given age and fragility  - Follow-up plan: Dispo pending, re-admit to floor, anticipate SNF placement   -Procedures: Percutaneous fixation of left femoral neck

## 2023-12-21 NOTE — H&P (View-Only) (Signed)
 ORTHOPAEDIC CONSULTATION  REQUESTING PHYSICIAN: Judeth Trenda BIRCH, MD  Chief Complaint: mechanical fall resulting in left femur fracture  HPI: [name] is a 88 y.o. female with history of paroxysmal A-fib, chronic diastolic heart failure, history of DVT on Coumadin, hypertension, hypothyroidism, presents to the ER today after mechanical fall at home. She states that she was try to place something into invigorated. Lost her grip on refrigerator and fell onto her left side resulting in acute left femoral neck fracture. Orthopedics was consulted for surgical recommendations and management.   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/20/2011   R breast DCIS, ER/PR +   Cataract 3 and 10/92   bilateral   Chronic diastolic CHF (congestive heart failure) (HCC)    Compression fracture of fourth lumbar vertebra (HCC)    DVT (deep venous thrombosis) (HCC) 08/2011   LL extremity    Fibromyalgia    Fracture lumbar vertebra-closed (HCC)    Fracture of thoracic vertebra, closed (HCC)    GERD (gastroesophageal reflux disease)    H/O hiatal hernia    History of blood clots    History of radiation therapy 01/2012   R breast   Hypercholesteremia    Hypertension    DR JINNY BUDGE   Hypothyroidism    Mitral regurgitation    Mitral valve prolapse    Osteoporosis    PAF (paroxysmal atrial fibrillation) (HCC)    a. dx 11/2016.   Pulmonary hypertension (HCC)    Rib fractures    Thoracic ascending aortic aneurysm (HCC)    a. followed by Dr. Fleeta Ochoa.   Tricuspid regurgitation    Past Surgical History:  Procedure Laterality Date   ANTERIOR CERVICAL DECOMP/DISCECTOMY FUSION N/A 09/09/2015   Procedure: Anterior Cervical Decompression and Fusion Cervical seven-Thorasic one ;  Surgeon: Fairy Levels, MD;  Location: [facility];  Service: Neurosurgery;  Laterality: N/A;   APPENDECTOMY  1940   BREAST SURGERY     CATARACT  EXTRACTION  1992   EYE SURGERY  1940, 1956   HERNIA REPAIR  10/16/2006   [facility] - Dr Gladis   KYPHOPLASTY N/A 01/26/2013   Procedure: KYPHOPLASTY;  Surgeon: Victory Gens, MD;  Location: [facility];  Service: Neurosurgery;  Laterality: N/A;  T11 and L1   KYPHOPLASTY N/A 06/21/2017   Procedure: Lumbar four Kyphoplasty;  Surgeon: Levels Fairy, MD;  Location: [facility];  Service: Neurosurgery;  Laterality: N/A;   MASTECTOMY, PARTIAL  10/17/2011   Procedure: MASTECTOMY PARTIAL;  Surgeon: Sherlean JINNY Laughter, MD;  Location: [facility] OR;  Service: General;  Laterality: Right;  needle guided   TEE WITHOUT CARDIOVERSION N/A 12/04/2016   Procedure: TRANSESOPHAGEAL ECHOCARDIOGRAM (TEE);  Surgeon: Vinie JAYSON Maxcy, MD;  Location: [facility] ENDOSCOPY;  Service: Cardiovascular;  Laterality: N/A;   TONSILLECTOMY  1944   Social History   Socioeconomic History   Marital status: Widowed    Spouse name: Not on file   Number of children: Not on file   Years of education: Not on file   Highest education level: Not on file  Occupational History   Not on file  Tobacco Use   Smoking status: Never   Smokeless tobacco: Never  Vaping Use   Vaping status: Never Used  Substance and Sexual Activity   Alcohol use: No   Drug use: No   Sexual activity: Never  Other Topics Concern   Not on  file  Social History Narrative   Are you right handed or left handed? Right   Are you currently employed ?    What is your current occupation? retired   Do you live at home alone? Yes but has a care giver,5 days a week   Who lives with you?    What type of home do you live in: 1 story or 2 story? one    Caffeine  1 soda aday   Social Drivers of Health   Financial Resource Strain: Not on file  Food Insecurity: No Food Insecurity (12/21/2023)   Hunger Vital Sign    Worried About Running Out of Food in the Last Year: Never true    Ran Out of Food in the Last Year: Never true  Transportation Needs: No Transportation Needs (12/21/2023)   PRAPARE  - Administrator, Civil Service (Medical): No    Lack of Transportation (Non-Medical): No  Physical Activity: Not on file  Stress: Not on file  Social Connections: Unknown (12/21/2023)   Social Connection and Isolation Panel [NHANES]    Frequency of Communication with Friends and Family: Never    Frequency of Social Gatherings with Friends and Family: Never    Attends Religious Services: Never    Database Administrator or Organizations: No    Attends Engineer, Structural: Never    Marital Status: Not on file   Family History  Problem Relation Age of Onset   Heart disease Mother    Heart disease Maternal Uncle    Heart disease Maternal Grandfather    Allergies  Allergen Reactions   Contrast Media [Iodinated Contrast Media] Shortness Of Breath   Iohexol  Shortness Of Breath   Mucinex  [Guaifenesin  Er] Other (See Comments)    DIZZINESS   Zanaflex [Tizanidine Hcl] Anxiety and Other (See Comments)    Dizziness   Penicillins Rash    Has patient had a PCN reaction causing immediate rash, facial/tongue/throat swelling, SOB or lightheadedness with hypotension: No Has patient had a PCN reaction causing severe rash involving mucus membranes or skin necrosis: No Has patient had a PCN reaction that required hospitalization No Has patient had a PCN reaction occurring within the last 10 years: No If all of the above answers are NO, then may proceed with Cephalosporin use.    Robaxin  [Methocarbamol ] Diarrhea   Prior to Admission medications   Medication Sig Start Date End Date Taking? Authorizing Provider  amiodarone  (PACERONE ) 200 MG tablet Take 1 tablet (200 mg total) by mouth daily. 09/03/23  Yes Vannie Reche RAMAN, NP  apixaban  (ELIQUIS ) 2.5 MG TABS tablet Take 1 tablet (2.5 mg total) by mouth 2 (two) times daily. 09/03/23  Yes Vannie Reche RAMAN, NP  Ascorbic Acid (VITA-C PO) Take 1,000 mg by mouth daily.   Yes [provider]  calcium  citrate-vitamin D   (CITRACAL+D) 315-200 MG-UNIT per tablet Take 1 tablet by mouth 2 (two) times daily. 01/28/13  Yes Signa Rush, MD  diltiazem  (CARDIZEM  CD) 120 MG 24 hr capsule Take 1 capsule (120 mg total) by mouth daily. 03/08/23 12/21/23 Yes Walker, Caitlin S, NP  Docusate Calcium  (STOOL SOFTENER PO) Take 1 tablet by mouth daily.   Yes [provider]  estradiol (ESTRACE) 0.1 MG/GM vaginal cream Place 1 Applicatorful vaginally 2 (two) times a week.   Yes [provider]  furosemide  (LASIX ) 20 MG tablet Take 20 mg by mouth daily.   Yes [provider]  HYDROcodone -acetaminophen  (NORCO/VICODIN) 5-325 MG tablet Take  1-2 tablets by mouth every 6 (six) hours as needed (mild pain). Patient taking differently: Take 1-2 tablets by mouth 3 (three) times daily as needed for moderate pain (pain score 4-6). 12/03/22  Yes Fairy Frames, MD  iron  polysaccharides (NIFEREX) 150 MG capsule Take 1 capsule (150 mg total) by mouth 3 (three) times a week. M, W, F 12/03/22  Yes Fairy Frames, MD  levothyroxine  (SYNTHROID ) 125 MCG tablet Take 1 tablet (125 mcg total) by mouth daily before breakfast. 01/02/23  Yes Hobart Powell BRAVO, MD  Meclizine  HCl (BONINE PO) Take by mouth as needed.   Yes [provider]  Multiple Vitamin (MULTIVITAMIN) tablet Take 1 tablet by mouth daily with breakfast.   Yes [provider]  omeprazole  (PRILOSEC ) 20 MG capsule Take 1 capsule (20 mg total) by mouth daily. 02/15/23  Yes Wyn Jackee VEAR Mickey., NP  OVER THE COUNTER MEDICATION Take 1,000 mg by mouth daily. D-Mannose 500 mg   Yes [provider]  polyethylene glycol (MIRALAX  / GLYCOLAX ) 17 g packet Take 17 g by mouth daily.   Yes [provider]  pravastatin  (PRAVACHOL ) 20 MG tablet Take 1 tablet (20 mg total) by mouth daily with supper. 12/03/22  Yes Fairy Frames, MD  risedronate  (ACTONEL ) 150 MG tablet Take 150 mg by mouth every 30 (thirty) days. with water on empty stomach, nothing by mouth  or lie down for next 30 minutes.   Yes [provider]  solifenacin  (VESICARE ) 10 MG tablet Take 1 tablet (10 mg total) by mouth daily. Patient taking differently: Take 5 mg by mouth daily. 12/03/22  Yes Fairy Frames, MD  spironolactone  (ALDACTONE ) 25 MG tablet Take 1/2 tablet (12.5 mg total) by mouth daily. 07/16/23  Yes Lonni Slain, MD  senna-docusate (SENOKOT-S) 8.6-50 MG tablet Take 1 tablet by mouth 2 (two) times daily. Patient not taking: Reported on 12/21/2023 12/03/22   Fairy Frames, MD   CT T-SPINE NO CHARGE Result Date: 12/20/2023 CLINICAL DATA:  fall EXAM: CT Thoracic and Lumbar spine without contrast TECHNIQUE: Multiplanar CT images of the thoracic and lumbar spine were reconstructed from contemporary CT of the Chest, Abdomen, and Pelvis. RADIATION DOSE REDUCTION: This exam was performed according to the departmental dose-optimization program which includes automated exposure control, adjustment of the mA and/or kV according to patient size and/or use of iterative reconstruction technique. CONTRAST:  None or No additional COMPARISON:  None Available. FINDINGS: CT THORACIC SPINE FINDINGS Alignment: Normal. Vertebrae: Diffusely decreased bone density. C7-T1 anterior cervical discectomy and fusion surgical hardware. Interval worsening of T10 superior endplate compression fracture with 40% vertebral body height. T8, T9, T11, T12 chronic stable fractures. Kyphoplasty of T11. No acute fracture or focal pathologic process. Paraspinal and other soft tissues: Negative. Disc levels: Multilevel severe intervertebral disc space narrowing. CT LUMBAR SPINE FINDINGS Segmentation: 5 lumbar type vertebrae. Alignment: Normal. Vertebrae: Diffusely decreased bone density. Chronic stable L1, L2, L3, L4 fractures status post L1 and L4 kyphoplasty. No acute fracture or focal pathologic process. Paraspinal and other soft tissues: Negative. Disc levels: Multilevel intervertebral disc space vacuum  phenomenon. IMPRESSION: 1. Interval worsening of T10 superior endplate compression fracture with 40% vertebral body height. Correlate with point tenderness to palpation for an acute component. 2. Multilevel chronic stable thoracolumbar fractures. 3. Diffusely decreased bone density. Electronically Signed   By: Morgane  Naveau M.D.   On: 12/20/2023 22:43   CT L-SPINE NO CHARGE Result Date: 12/20/2023 CLINICAL DATA:  fall EXAM: CT Thoracic and Lumbar spine without contrast  TECHNIQUE: Multiplanar CT images of the thoracic and lumbar spine were reconstructed from contemporary CT of the Chest, Abdomen, and Pelvis. RADIATION DOSE REDUCTION: This exam was performed according to the departmental dose-optimization program which includes automated exposure control, adjustment of the mA and/or kV according to patient size and/or use of iterative reconstruction technique. CONTRAST:  None or No additional COMPARISON:  None Available. FINDINGS: CT THORACIC SPINE FINDINGS Alignment: Normal. Vertebrae: Diffusely decreased bone density. C7-T1 anterior cervical discectomy and fusion surgical hardware. Interval worsening of T10 superior endplate compression fracture with 40% vertebral body height. T8, T9, T11, T12 chronic stable fractures. Kyphoplasty of T11. No acute fracture or focal pathologic process. Paraspinal and other soft tissues: Negative. Disc levels: Multilevel severe intervertebral disc space narrowing. CT LUMBAR SPINE FINDINGS Segmentation: 5 lumbar type vertebrae. Alignment: Normal. Vertebrae: Diffusely decreased bone density. Chronic stable L1, L2, L3, L4 fractures status post L1 and L4 kyphoplasty. No acute fracture or focal pathologic process. Paraspinal and other soft tissues: Negative. Disc levels: Multilevel intervertebral disc space vacuum phenomenon. IMPRESSION: 1. Interval worsening of T10 superior endplate compression fracture with 40% vertebral body height. Correlate with point tenderness to palpation for  an acute component. 2. Multilevel chronic stable thoracolumbar fractures. 3. Diffusely decreased bone density. Electronically Signed   By: Morgane  Naveau M.D.   On: 12/20/2023 22:43   CT CHEST ABDOMEN PELVIS WO CONTRAST Result Date: 12/20/2023 CLINICAL DATA:  Polytrauma, blunt fall, back pain, hlp pain. hit head. eliquis  use. EXAM: CT CHEST, ABDOMEN AND PELVIS WITHOUT CONTRAST TECHNIQUE: Multidetector CT imaging of the chest, abdomen and pelvis was performed following the standard protocol without IV contrast. RADIATION DOSE REDUCTION: This exam was performed according to the departmental dose-optimization program which includes automated exposure control, adjustment of the mA and/or kV according to patient size and/or use of iterative reconstruction technique. COMPARISON:  CT chest abdomen pelvis 11/22/2022, CT pelvis 06/08/2011, CT abdomen pelvis 05/30/2017 FINDINGS: CHEST: Cardiovascular: Stable aneurysmal ascending thoracic aorta measuring up to 4.6 cm. The heart is normal in size. No significant pericardial effusion. Severe atherosclerotic plaque. Mitral annular calcification. Coronary artery calcification. Lungs/Pleura: Left lower lobe passive atelectasis. No focal consolidation. No pulmonary nodule. No pulmonary mass. No pulmonary contusion or laceration. No pneumatocele formation. No pleural effusion. No pneumothorax. No hemothorax. Mediastinum/Nodes: No pneumomediastinum. The central airways are patent. The esophagus is unremarkable. Stable large hiatal hernia containing the entire stomach. The thyroid  is unremarkable. Limited evaluation for hilar lymphadenopathy on this noncontrast study. No mediastinal or axillary lymphadenopathy. Musculoskeletal/Chest wall No chest wall mass. Diffusely decreased bone density. Old healed right rib fractures. No acute rib or sternal fracture. Please see separately dictated CT thoracolumbar spine 12/20/2023. ABDOMEN / PELVIS: Hepatobiliary: Not enlarged. Hyperdense  hepatic parenchyma.  No focal lesion. The gallbladder is otherwise unremarkable with no radio-opaque gallstones. No biliary ductal dilatation. Pancreas: Normal pancreatic contour. No main pancreatic duct dilatation. Spleen: Not enlarged. No focal lesion. Adrenals/Urinary Tract: No nodularity bilaterally. No hydroureteronephrosis. No nephroureterolithiasis. No contour deforming renal mass. The urinary bladder is unremarkable. Stomach/Bowel: No small or large bowel wall thickening or dilatation. Stool throughout the majority of the colon. The appendix is unremarkable. Vasculature/Lymphatic: Moderate atherosclerotic plaque. No abdominal aorta or iliac aneurysm. No abdominal, pelvic, inguinal lymphadenopathy. Reproductive: Normal. Other: No simple free fluid ascites. No pneumoperitoneum. No mesenteric hematoma identified. No organized fluid collection. Musculoskeletal: No significant soft tissue hematoma. Persistent densely sclerotic right iliac bone lesion. Diffusely decreased bone density. Acute impacted left femoral neck fracture. Please see separately  dictated CT thoracolumbar spine 12/20/2023. Ports and Devices: None. IMPRESSION: 1. No acute intrathoracic, intra-abdominal, intrapelvic traumatic injury with limited evaluation on this noncontrast study. 2. Please see separately dictated CT thoracolumbar spine 12/20/2023. 3. Stable aneurysmal ascending thoracic aorta (4.6 cm). Ascending thoracic aortic aneurysm. Recommend semi-annual imaging followup by CTA or MRA and referral to cardiothoracic surgery if not already obtained. This recommendation follows 2010 ACCF/AHA/AATS/ACR/ASA/SCA/SCAI/SIR/STS/SVM Guidelines for the Diagnosis and Management of Patients With Thoracic Aortic Disease. Circulation. 2010; 121: [id number]. Aortic aneurysm NOS (ICD10-I71.9). Aortic aneurysm NOS (ICD10-I71.9). 4. Aortic Atherosclerosis (ICD10-I70.0) including coronary artery and mitral annular calcification. 5. Acute impacted left femoral  neck fracture. 6. Constipation. Electronically Signed   By: Morgane  Naveau M.D.   On: 12/20/2023 22:29   DG Ankle Complete Right Result Date: 12/20/2023 CLINICAL DATA:  Status post fall. EXAM: RIGHT ANKLE - COMPLETE 3+ VIEW COMPARISON:  None Available. FINDINGS: There is no evidence of fracture, dislocation, or joint effusion. There is no evidence of arthropathy or other focal bone abnormality. Moderate severity soft tissue swelling is seen along the medial aspect of the distal right tibial shaft. IMPRESSION: Medial soft tissue swelling without evidence of acute fracture or dislocation. Electronically Signed   By: Suzen Dials M.D.   On: 12/20/2023 22:21   DG Tibia/Fibula Right Result Date: 12/20/2023 CLINICAL DATA:  Status post fall. EXAM: RIGHT TIBIA AND FIBULA - 2 VIEW COMPARISON:  None Available. FINDINGS: There is no evidence of fracture or other focal bone lesions. Mild to moderate severity focal soft tissue swelling is seen along the medial aspect of the distal right tibial shaft. IMPRESSION: Focal soft tissue swelling along the medial aspect of the distal right tibial shaft. Electronically Signed   By: Suzen Dials M.D.   On: 12/20/2023 22:19   DG Hip [place] W or [place] Pelvis 1 View Left Result Date: 12/20/2023 CLINICAL DATA:  Status post fall. EXAM: DG HIP (WITH OR WITHOUT PELVIS) 1V PORT LEFT COMPARISON:  None Available. FINDINGS: There is no evidence of an acute hip fracture or dislocation. Mild degenerative changes are seen in the form of joint space narrowing and acetabular sclerosis. IMPRESSION: Mild degenerative changes without evidence of an acute fracture or dislocation. Electronically Signed   By: Suzen Dials M.D.   On: 12/20/2023 22:18   CT HEAD WO CONTRAST ( ) Result Date: 12/20/2023 CLINICAL DATA:  Polytrauma, blunt fall; Polytrauma, blunt EXAM: CT HEAD WITHOUT CONTRAST CT CERVICAL SPINE WITHOUT CONTRAST TECHNIQUE: Multidetector CT imaging of the head and cervical  spine was performed following the standard protocol without intravenous contrast. Multiplanar CT image reconstructions of the cervical spine were also generated. RADIATION DOSE REDUCTION: This exam was performed according to the departmental dose-optimization program which includes automated exposure control, adjustment of the mA and/or kV according to patient size and/or use of iterative reconstruction technique. COMPARISON:  None Available. FINDINGS: CT HEAD FINDINGS Brain: Cerebral ventricle sizes are concordant with the degree of cerebral volume loss. Patchy and confluent areas of decreased attenuation are noted throughout the deep and periventricular white matter of the cerebral hemispheres bilaterally, compatible with chronic microvascular ischemic disease. Scattered cerebral parenchyma calcifications. No evidence of large-territorial acute infarction. No parenchymal hemorrhage. No mass lesion. No extra-axial collection. No mass effect or midline shift. No hydrocephalus. Basilar cisterns are patent. Vascular: No hyperdense vessel. Skull: No acute fracture or focal lesion. Sinuses/Orbits: Almost complete opacification of left maxillary sinus. Otherwise paranasal sinuses and mastoid air cells are clear. Bilateral lens replacement. Otherwise the orbits  are unremarkable. Other: None. CT CERVICAL SPINE FINDINGS Alignment: Grade 1 anterolisthesis of C4 on C5. Skull base and vertebrae: C7-T1 anterior cervical discectomy and fusion surgical hardware. Multilevel moderate severe degenerative changes of the spine. No associated severe osseous central canal stenosis. Associated moderate to severe left C5-C6 osseous neural foraminal stenosis. No acute fracture. No aggressive appearing focal osseous lesion or focal pathologic process. Soft tissues and spinal canal: No prevertebral fluid or swelling. No visible canal hematoma. Upper chest: Unremarkable. Other: Atherosclerotic plaque. IMPRESSION: 1. No acute intracranial  abnormality. 2. No acute displaced fracture or traumatic listhesis of the cervical spine. 3. Scattered cerebral parenchyma calcifications. Findings likely sequelae of prior infection/inflammation. 4.  Aortic Atherosclerosis (ICD10-I70.0). Electronically Signed   By: Morgane  Naveau M.D.   On: 12/20/2023 22:05   CT Cervical Spine Wo Contrast Result Date: 12/20/2023 CLINICAL DATA:  Polytrauma, blunt fall; Polytrauma, blunt EXAM: CT HEAD WITHOUT CONTRAST CT CERVICAL SPINE WITHOUT CONTRAST TECHNIQUE: Multidetector CT imaging of the head and cervical spine was performed following the standard protocol without intravenous contrast. Multiplanar CT image reconstructions of the cervical spine were also generated. RADIATION DOSE REDUCTION: This exam was performed according to the departmental dose-optimization program which includes automated exposure control, adjustment of the mA and/or kV according to patient size and/or use of iterative reconstruction technique. COMPARISON:  None Available. FINDINGS: CT HEAD FINDINGS Brain: Cerebral ventricle sizes are concordant with the degree of cerebral volume loss. Patchy and confluent areas of decreased attenuation are noted throughout the deep and periventricular white matter of the cerebral hemispheres bilaterally, compatible with chronic microvascular ischemic disease. Scattered cerebral parenchyma calcifications. No evidence of large-territorial acute infarction. No parenchymal hemorrhage. No mass lesion. No extra-axial collection. No mass effect or midline shift. No hydrocephalus. Basilar cisterns are patent. Vascular: No hyperdense vessel. Skull: No acute fracture or focal lesion. Sinuses/Orbits: Almost complete opacification of left maxillary sinus. Otherwise paranasal sinuses and mastoid air cells are clear. Bilateral lens replacement. Otherwise the orbits are unremarkable. Other: None. CT CERVICAL SPINE FINDINGS Alignment: Grade 1 anterolisthesis of C4 on C5. Skull base  and vertebrae: C7-T1 anterior cervical discectomy and fusion surgical hardware. Multilevel moderate severe degenerative changes of the spine. No associated severe osseous central canal stenosis. Associated moderate to severe left C5-C6 osseous neural foraminal stenosis. No acute fracture. No aggressive appearing focal osseous lesion or focal pathologic process. Soft tissues and spinal canal: No prevertebral fluid or swelling. No visible canal hematoma. Upper chest: Unremarkable. Other: Atherosclerotic plaque. IMPRESSION: 1. No acute intracranial abnormality. 2. No acute displaced fracture or traumatic listhesis of the cervical spine. 3. Scattered cerebral parenchyma calcifications. Findings likely sequelae of prior infection/inflammation. 4.  Aortic Atherosclerosis (ICD10-I70.0). Electronically Signed   By: Morgane  Naveau M.D.   On: 12/20/2023 22:05   Family History Reviewed and non-contributory, no pertinent history of problems with bleeding or anesthesia      Review of Systems 14 system ROS conducted and negative except for that noted in HPI   OBJECTIVE  Vitals:Patient Vitals for the past 8 hrs:  BP Temp Temp src Pulse Resp SpO2  12/21/23 0629 107/79 -- -- 73 -- 95 %  12/21/23 0319 109/76 98.1 F (36.7 C) Oral 83 16 91 %  12/21/23 0245 97/71 -- -- 78 -- 96 %  12/21/23 0230 97/70 -- -- 78 -- 94 %  12/21/23 0215 92/69 -- -- 75 -- 95 %  12/21/23 0145 100/76 -- -- 78 -- 94 %  12/21/23 0130 94/69 -- -- 76 --  96 %  12/21/23 0100 107/77 -- -- 76 -- 94 %  12/21/23 0000 110/78 -- -- 76 -- 94 %  12/20/23 2345 109/78 98.1 F (36.7 C) Oral 74 17 97 %  12/20/23 2330 96/71 -- -- 72 -- 94 %   General: Elderly, frail, Alert, no acute distress, cachectic appearing Cardiovascular: Warm extremities noted Respiratory: No cyanosis, no use of accessory musculature GI: No organomegaly, abdomen is soft and non-tender Skin: No lesions in the area of chief complaint other than those listed below in MSK  exam.  Neurologic: Sensation intact distally save for the below mentioned MSK exam Psychiatric: Patient is competent for consent with normal mood and affect Lymphatic: No swelling obvious and reported other than the area involved in the exam below Extremities  RUE: moves freely without difficulty  LUE: moves freely without diffusely LLE: significant ecchymoses of LLE. 1+ pitting edema to mid shin. TTP over lateral and posterior left hip. Left LLE appears shortened and internally rotated. Ankle dorsi/plantar flexion intact, moves all toes without difficulty. Unable to flex upper thigh to flex at the knee 2/2 pain and known fracture. Sensation diffusely intact distally.   RLE: Ecchymoses present to mid shin. 1 + pitting edema. NVI distally.     Labs cbc Recent Labs    12/20/23 2220 12/21/23 0705  WBC 20.0* 19.9*  HGB 13.3 13.1  HCT 37.9 36.7  PLT 170 170    Labs inflam No results for input(s): CRP in the last 72 hours.  Invalid input(s): ESR  Labs coag No results for input(s): INR, PTT in the last 72 hours.  Invalid input(s): PT  Recent Labs    12/20/23 2220  NA 138  K 3.3*  CL 98  CO2 25  GLUCOSE 141*  BUN 26*  CREATININE 0.97  CALCIUM  9.2     ASSESSMENT AND PLAN: 88 y.o. female with the following: left femoral neck fracture requiring percutaneous fixation. Risks and benefits discussed with patient. She is aware of risk for non-union/malunion with potential need for hemiarthroplasty in the future. She understands this. Goal to get patient moving without pain.   This patient requires inpatient admission to manage this problem appropriately.  Orthopedics recommends admission to a medical service and we will provide consultation and follow along  - Weight Bearing Status/Activity: NWB until post-op instructions  - Additional recommended labs/tests: None  -VTE Prophylaxis: Eliquis  to be resumed 24 hour post op  - Pain control: multimodal, avoid heavily  sedating meds given age and fragility  - Follow-up plan: Dispo pending, re-admit to floor, anticipate SNF placement   -Procedures: Percutaneous fixation of left femoral neck

## 2023-12-21 NOTE — Assessment & Plan Note (Signed)
 Will need to continue Eliquis  after ORIF. 2.5 mg bid

## 2023-12-22 DIAGNOSIS — S22070D Wedge compression fracture of T9-T10 vertebra, subsequent encounter for fracture with routine healing: Secondary | ICD-10-CM | POA: Diagnosis not present

## 2023-12-22 DIAGNOSIS — S72002D Fracture of unspecified part of neck of left femur, subsequent encounter for closed fracture with routine healing: Secondary | ICD-10-CM

## 2023-12-22 DIAGNOSIS — D696 Thrombocytopenia, unspecified: Secondary | ICD-10-CM | POA: Diagnosis not present

## 2023-12-22 LAB — CBC
HCT: 32.8 % — ABNORMAL LOW (ref 36.0–46.0)
Hemoglobin: 11.3 g/dL — ABNORMAL LOW (ref 12.0–15.0)
MCH: 30.2 pg (ref 26.0–34.0)
MCHC: 34.5 g/dL (ref 30.0–36.0)
MCV: 87.7 fL (ref 80.0–100.0)
Platelets: 124 10*3/uL — ABNORMAL LOW (ref 150–400)
RBC: 3.74 MIL/uL — ABNORMAL LOW (ref 3.87–5.11)
RDW: 14.4 % (ref 11.5–15.5)
WBC: 15.5 10*3/uL — ABNORMAL HIGH (ref 4.0–10.5)
nRBC: 0 % (ref 0.0–0.2)

## 2023-12-22 LAB — COMPREHENSIVE METABOLIC PANEL
ALT: 49 U/L — ABNORMAL HIGH (ref 0–44)
AST: 66 U/L — ABNORMAL HIGH (ref 15–41)
Albumin: 2.8 g/dL — ABNORMAL LOW (ref 3.5–5.0)
Alkaline Phosphatase: 36 U/L — ABNORMAL LOW (ref 38–126)
Anion gap: 9 (ref 5–15)
BUN: 29 mg/dL — ABNORMAL HIGH (ref 8–23)
CO2: 24 mmol/L (ref 22–32)
Calcium: 8.3 mg/dL — ABNORMAL LOW (ref 8.9–10.3)
Chloride: 100 mmol/L (ref 98–111)
Creatinine, Ser: 0.88 mg/dL (ref 0.44–1.00)
GFR, Estimated: >60 mL/min (ref >60)
Glucose, Bld: 105 mg/dL — ABNORMAL HIGH (ref 70–99)
Potassium: 4.4 mmol/L (ref 3.5–5.1)
Sodium: 133 mmol/L — ABNORMAL LOW (ref 135–145)
Total Bilirubin: 0.5 mg/dL (ref 0.0–1.2)
Total Protein: 5.2 g/dL — ABNORMAL LOW (ref 6.5–8.1)

## 2023-12-22 MED ORDER — POLYETHYLENE GLYCOL 3350 17 G PO PACK
17.0000 g | PACK | Freq: Two times a day (BID) | ORAL | Status: DC
  Administered 2023-12-22 – 2023-12-25 (×7): 17 g via ORAL
  Filled 2023-12-22 (×8): qty 1

## 2023-12-22 MED ORDER — OXYCODONE HCL 5 MG PO TABS
5.0000 mg | ORAL_TABLET | Freq: Four times a day (QID) | ORAL | Status: DC | PRN
  Administered 2023-12-23: 5 mg via ORAL
  Filled 2023-12-22: qty 1

## 2023-12-22 MED ORDER — SPIRONOLACTONE 12.5 MG HALF TABLET
12.5000 mg | ORAL_TABLET | Freq: Every day | ORAL | Status: DC
  Administered 2023-12-23 – 2023-12-25 (×3): 12.5 mg via ORAL
  Filled 2023-12-22 (×4): qty 1

## 2023-12-22 MED ORDER — POLYSACCHARIDE IRON COMPLEX 150 MG PO CAPS
150.0000 mg | ORAL_CAPSULE | ORAL | Status: DC
  Administered 2023-12-23 – 2023-12-25 (×2): 150 mg via ORAL
  Filled 2023-12-22 (×2): qty 1

## 2023-12-22 MED ORDER — DILTIAZEM HCL ER COATED BEADS 120 MG PO CP24
120.0000 mg | ORAL_CAPSULE | Freq: Every day | ORAL | Status: DC
  Administered 2023-12-24 – 2023-12-25 (×2): 120 mg via ORAL
  Filled 2023-12-22 (×5): qty 1

## 2023-12-22 MED ORDER — BISACODYL 5 MG PO TBEC: 5.0000 mg | DELAYED_RELEASE_TABLET | Freq: Every day | ORAL | Status: DC | PRN

## 2023-12-22 MED ORDER — PRAVASTATIN SODIUM 10 MG PO TABS
20.0000 mg | ORAL_TABLET | Freq: Every day | ORAL | Status: DC
  Administered 2023-12-22 – 2023-12-25 (×4): 20 mg via ORAL
  Filled 2023-12-22 (×4): qty 2

## 2023-12-22 MED ORDER — FESOTERODINE FUMARATE ER 4 MG PO TB24
4.0000 mg | ORAL_TABLET | Freq: Every day | ORAL | Status: DC
  Administered 2023-12-22 – 2023-12-25 (×2): 4 mg via ORAL
  Filled 2023-12-22 (×5): qty 1

## 2023-12-22 MED ORDER — FUROSEMIDE 20 MG PO TABS
20.0000 mg | ORAL_TABLET | Freq: Every day | ORAL | Status: DC
  Administered 2023-12-23 – 2023-12-25 (×3): 20 mg via ORAL
  Filled 2023-12-22 (×3): qty 1

## 2023-12-22 MED ORDER — ACETAMINOPHEN 500 MG PO TABS
1000.0000 mg | ORAL_TABLET | Freq: Three times a day (TID) | ORAL | Status: DC
  Administered 2023-12-22 – 2023-12-23 (×3): 1000 mg via ORAL
  Filled 2023-12-22 (×3): qty 2

## 2023-12-22 NOTE — Progress Notes (Signed)
     Subjective:  Patient reports pain as moderate.  Concerned about her medication regimen.  Objective:   VITALS:   Vitals:   12/22/23 0033 12/22/23 0417 12/22/23 0830 12/22/23 1028  BP: 93/66 90/64 122/73 115/76  Pulse: 69 68 70 64  Resp: 18 18 19    Temp: (!) 97.5 F (36.4 C) 97.8 F (36.6 C) 97.9 F (36.6 C) 98 F (36.7 C)  TempSrc: Oral Oral Oral Oral  SpO2: 97% 97%    Weight:      Height:        Neurologically intact Dorsiflexion/Plantar flexion intact Incision: scant drainage Dressing changed.    Lab Results  Component Value Date   WBC 15.5 (H) 12/22/2023   HGB 11.3 (L) 12/22/2023   HCT 32.8 (L) 12/22/2023   MCV 87.7 12/22/2023   PLT 124 (L) 12/22/2023   BMET    Component Value Date/Time   NA 133 (L) 12/22/2023 0524   NA 137 06/04/2023 1150   K 4.4 12/22/2023 0524   CL 100 12/22/2023 0524   CO2 24 12/22/2023 0524   GLUCOSE 105 (H) 12/22/2023 0524   BUN 29 (H) 12/22/2023 0524   BUN 38 (H) 06/04/2023 1150   CREATININE 0.88 12/22/2023 0524   CREATININE 0.90 03/04/2015 1014   CALCIUM  8.3 (L) 12/22/2023 0524   EGFR 57 (L) 06/04/2023 1150   GFRNONAA >60 12/22/2023 0524     Assessment/Plan: 1 Day Post-Op   Principal Problem:   Closed hip fracture, left, initial encounter (HCC) Active Problems:   History of blood clots   Essential hypertension   Hypothyroidism   Leukocytosis   Hypokalemia   PAF (paroxysmal atrial fibrillation) (HCC)   Chronic diastolic CHF (congestive heart failure) (HCC)   Traumatic hematoma of lower leg, right, initial encounter   DNR (do not resuscitate)/DNI(Do Not intubate)   Advance diet WBAT  Appreciate medical management.   Ok for anticoagulation.  H/O DVT.      [name] 12/22/2023, 2:47 PM   Annette Olmsted, MD Cell [phone]

## 2023-12-22 NOTE — Progress Notes (Signed)
 Orthopedic Tech Progress Note Patient Details:  [name] 12/05/1935 [id number]  Ortho Devices Type of Ortho Device: Thoracolumbar corset (TLSO) Ortho Device/Splint Interventions: Ordered, Application   Post Interventions Instructions Provided: Care of device  Prabhav Faulkenberry A Wonda 12/22/2023, 3:47 PM

## 2023-12-22 NOTE — Progress Notes (Signed)
 PT Cancellation Note  Patient Details Name: Zi Sek MRN: [id number] DOB: 02-05-1936   Cancelled Treatment:    Reason Eval/Treat Not Completed: Medical issues which prohibited therapy (Waiting for further evaluation of T10 compression fx. Will re-attempt with appropriate.)  Lou Irigoyen W, PT, DPT Secure Chat Preferred  Rehab Office [phone]   Kate BRAVO Wendolyn 12/22/2023, 11:07 AM

## 2023-12-22 NOTE — Plan of Care (Signed)
  Problem: Clinical Measurements: Goal: Will remain free from infection Outcome: Progressing Goal: Diagnostic test results will improve Outcome: Progressing Goal: Respiratory complications will improve Outcome: Progressing   Problem: Pain Managment: Goal: General experience of comfort will improve and/or be controlled Outcome: Not Progressing

## 2023-12-22 NOTE — Progress Notes (Addendum)
 PROGRESS NOTE   [name]  FMW:[id number]    DOB: 15-Oct-1936    DOA: 12/20/2023  PCP: Ransom Other, MD   I have briefly reviewed patients previous medical records in [facility].  Chief Complaint  Patient presents with   Fall   Hip Pain    Brief Hospital Course:  88 year old female with medical history significant for paroxysmal A-fib, chronic diastolic CHF, DVT on Coumadin, hypertension, hypothyroidism, presented to the ED following a mechanical fall at home.  She was attempting to place something into the refrigerator, lost her grip and fell onto her left side with prominent pain.  Admitted for close left hip fracture and hematoma of right lower leg.  Orthopedic trauma and trauma surgery consulted.  She underwent left hip closed reduction and percutaneous pinning by Dr. Cristy on 2/8.   Assessment & Plan:  Principal Problem:   Closed hip fracture, left, initial encounter [facility]) Active Problems:   Leukocytosis   Hypokalemia   Traumatic hematoma of lower leg, right, initial encounter   History of blood clots   Essential hypertension   Hypothyroidism   PAF (paroxysmal atrial fibrillation) (HCC)   Chronic diastolic CHF (congestive heart failure) (HCC)   DNR (do not resuscitate)/DNI(Do Not intubate)   Closed left hip fracture/left impacted femoral neck fracture Orthopedics consulted and s/p closed reduction and percutaneous pinning by Dr. Cristy on 2/8. Management per orthopedics: WBAT (no orders were placed by orthopedics, placed now), DVT prophylaxis-continue prior home Eliquis  2.5 Mg twice daily, multimodality pain control, outpatient follow-up in office in 14 to 21 days for incision check, x-ray and suture removal. Per discussion with nursing, patient reports severe pain which is not controlled with current regimen.  Adjusted pain medications.  Tylenol  1 g 3 times daily, changed Norco to Oxy IR 5-10 mg Q6 hourly as needed for moderate pain, continue IV morphine  0.5-1  mg every 2 hours as needed for severe pain.  She appears to have intolerance to Robaxin  and Zanaflex and hence did not start. Patient asking for orthopedic follow-up.  Chronic versus acute on chronic T10 compression fracture Patient did not complain of back pain when I saw her this morning. However in the afternoon, per RN, patient complains of back pain and left-sided chest wall pain. Consulted Neurosurgery, recommended TLSO brace when OOB and they will see her on 2/10. No focal neurological deficits. Appears to have had a T11 kyphoplasty with Dr. Colon in 2018.  Traumatic right lower leg hematoma Monitor.  Stable.  Postop anemia and thrombocytopenia Follow CBC daily.  Transfuse if hemoglobin 7 g or less.  EBL at surgery: Minimal.  Mechanical fall CT C/A/P: No acute traumatic injury.  Hypokalemia Replaced   Leukocytosis Likely due to demargination from stress of fracture. No evidence of any infection. Slowly improving.  Continue to trend daily CBCs.   Chronic diastolic CHF (congestive heart failure) (HCC) Compensated.  Probably has not had significant p.o. intake all day yesterday from surgery etc.  Stated that she first got something to eat at around 10 PM. Hold off Lasix  and Aldactone  until tomorrow especially given soft blood pressures.   PAF (paroxysmal atrial fibrillation) (HCC) Continue home dose of diltiazem  CD120 mg daily and amiodarone  200 Mg daily. Eliquis  was held preop and started back on postop day 1.   Hypothyroidism Continue synthroid  125 mcg daily.   Essential hypertension Soft blood pressures this morning.  Cardizem  was not given this morning.  Then patient told pharmacy that she takes  this medicine in the evening send is scheduled for 5 PM Blood pressures better.   History of blood clots Continue Eliquis  2.5 Mg twice daily.  Mild transaminitis Unclear etiology.  CK normal. Follow CMP.  Stable  Stable aneurysmal ascending thoracic aorta (4.6  cm) Noted on CT C/A/P 2/7 Recommend semiannual imaging follow-up by CTA or MRA and referral to cardiothoracic surgery if not already done.  Outpatient follow-up.  Constipation Noted on CT A/P Aggressive bowel regimen.  Body mass index is 18.77 kg/m.  Nutritional Status Nutrition Problem: Increased nutrient needs Etiology: hip fracture, post-op healing Signs/Symptoms: estimated needs Interventions: Ensure Enlive (each supplement provides 350kcal and 20 grams of protein), MVI  DVT prophylaxis: apixaban  (ELIQUIS ) tablet 2.5 mg Start: 12/22/23 1000 SCDs Start: 12/21/23 1224 SCDs Start: 12/21/23 0304     Code Status: Limited: Do not attempt resuscitation (DNR) -DNR-LIMITED -Do Not Intubate/DNI :  Family Communication: Sister via phone. Disposition:  Status is: Inpatient Remains inpatient appropriate because: Immediate postop, needs therapies evaluation and likely will need SNF.     Consultants:   Orthopedics Trauma surgery  Procedures:   As noted above  Antimicrobials:      Subjective:  Several complaints.  States that she did not get anything to eat yesterday until 10 PM at night.  Also complains that she is not getting enough proteins in her meal such as Greek yogurt and her home milkshakes have 30 g proteins.  Ongoing pain issues as noted above.  Asking when her orthopedic doctor will be by to see her.  Subsequently to RN reported back pain and left-sided chest wall pain.  Objective:   Vitals:   12/22/23 0033 12/22/23 0417 12/22/23 0830 12/22/23 1028  BP: 93/66 90/64 122/73 115/76  Pulse: 69 68 70 64  Resp: 18 18 19    Temp: (!) 97.5 F (36.4 C) 97.8 F (36.6 C) 97.9 F (36.6 C) 98 F (36.7 C)  TempSrc: Oral Oral Oral Oral  SpO2: 97% 97%    Weight:      Height:        General exam: Elderly female, moderately built, thinly nourished lying propped up in bed without distress.  Looks improved compared to last evening. Respiratory system: Clear to auscultation.   No increased work of breathing. Cardiovascular system: S1 & S2 heard, RRR.  No JVD or pedal edema.  2/6 SEM best heard at apex.  No pedal edema.  Stable without change. Gastrointestinal system: Abdomen is nondistended, soft and nontender. No organomegaly or masses felt. Normal bowel sounds heard. Central nervous system: Alert and oriented.  No focal neurological deficits. Extremities: Small area of hematoma over right lower anterior shin with mild tenderness.  Stable compared to yesterday.  Compartments soft. Left hip postop dressing site clean and dry.  Has cold pack. RLE with at least grade 3 x 5 power.  LLE at least grade 2 x 5 power.  Both lower extremities power assessment limited due to pain. Skin: No rashes, lesions or ulcers.  Small hematoma noted over right lower leg anteriorly.  Details as in picture below from admission. Psychiatry: Judgement and insight appear somewhat impaired. Mood & affect overall irritable.       Data Reviewed:   I have personally reviewed following labs and imaging studies   CBC: Recent Labs  Lab 12/20/23 2220 12/21/23 0705 12/21/23 1332 12/22/23 0524  WBC 20.0* 19.9* 18.1* 15.5*  NEUTROABS 17.2* 17.8*  --   --   HGB 13.3 13.1 12.4 11.3*  HCT 37.9 36.7 35.7* 32.8*  MCV 89.4 88.4 88.6 87.7  PLT 170 170 151 124*    Basic Metabolic Panel: Recent Labs  Lab 12/20/23 2220 12/21/23 0705 12/22/23 0524  NA 138 136 133*  K 3.3* 4.1 4.4  CL 98 101 100  CO2 25 22 24   GLUCOSE 141* 122* 105*  BUN 26* 26* 29*  CREATININE 0.97 0.90 0.88  CALCIUM  9.2 8.4* 8.3*    Liver Function Tests: Recent Labs  Lab 12/20/23 2220 12/21/23 0705 12/22/23 0524  AST 45* 49* 66*  ALT 56* 60* 49*  ALKPHOS 47 46 36*  BILITOT 0.9 0.9 0.5  PROT 6.1* 6.0* 5.2*  ALBUMIN  3.4* 3.3* 2.8*    CBG: No results for input(s): GLUCAP in the last 168 hours.  Microbiology Studies:   Recent Results (from the past 240 hours)  Surgical PCR screen     Status: None    Collection Time: 12/21/23  3:22 AM   Specimen: Nasal Mucosa; Nasal Swab  Result Value Ref Range Status   MRSA, PCR NEGATIVE NEGATIVE Final   Staphylococcus aureus NEGATIVE NEGATIVE Final    Comment: (NOTE) The Xpert SA Assay (FDA approved for NASAL specimens in patients 30 years of age and older), is one component of a comprehensive surveillance program. It is not intended to diagnose infection nor to guide or monitor treatment. Performed at [facility], [address] [place], [place] [address]     Radiology Studies:  DG FEMUR MIN 2 VIEWS LEFT Result Date: 12/21/2023 CLINICAL DATA:  Status post repair of hip fracture. EXAM: LEFT FEMUR 2 VIEWS COMPARISON:  Preoperative imaging. FINDINGS: Three screws traverse left femoral neck fracture. Stable fracture alignment. Recent postsurgical change includes air and edema in the soft tissues. The distal femur is intact. IMPRESSION: ORIF left proximal femur fracture. Electronically Signed   By: Andrea Gasman M.D.   On: 12/21/2023 11:16   DG FEMUR MIN 2 VIEWS LEFT Result Date: 12/21/2023 CLINICAL DATA:  Elective surgery. EXAM: LEFT FEMUR 2 VIEWS COMPARISON:  Radiograph yesterday FINDINGS: Four fluoroscopic spot views of the left proximal femur submitted from the operating room. Pinning of femoral neck fracture. Fluoroscopy time 140.8 seconds. Dose 22.65 mGy. IMPRESSION: Procedural fluoroscopy during left femoral neck fracture pinning. Electronically Signed   By: Andrea Gasman M.D.   On: 12/21/2023 11:15   DG C-Arm 1-60 Min-No Report Result Date: 12/21/2023 Fluoroscopy was utilized by the requesting physician.  No radiographic interpretation.   DG C-Arm 1-60 Min-No Report Result Date: 12/21/2023 Fluoroscopy was utilized by the requesting physician.  No radiographic interpretation.   CT T-SPINE NO CHARGE Result Date: 12/20/2023 CLINICAL DATA:  fall EXAM: CT Thoracic and Lumbar spine without contrast TECHNIQUE: Multiplanar CT images of the  thoracic and lumbar spine were reconstructed from contemporary CT of the Chest, Abdomen, and Pelvis. RADIATION DOSE REDUCTION: This exam was performed according to the departmental dose-optimization program which includes automated exposure control, adjustment of the mA and/or kV according to patient size and/or use of iterative reconstruction technique. CONTRAST:  None or No additional COMPARISON:  None Available. FINDINGS: CT THORACIC SPINE FINDINGS Alignment: Normal. Vertebrae: Diffusely decreased bone density. C7-T1 anterior cervical discectomy and fusion surgical hardware. Interval worsening of T10 superior endplate compression fracture with 40% vertebral body height. T8, T9, T11, T12 chronic stable fractures. Kyphoplasty of T11. No acute fracture or focal pathologic process. Paraspinal and other soft tissues: Negative. Disc levels: Multilevel severe intervertebral disc space narrowing. CT LUMBAR SPINE FINDINGS  Segmentation: 5 lumbar type vertebrae. Alignment: Normal. Vertebrae: Diffusely decreased bone density. Chronic stable L1, L2, L3, L4 fractures status post L1 and L4 kyphoplasty. No acute fracture or focal pathologic process. Paraspinal and other soft tissues: Negative. Disc levels: Multilevel intervertebral disc space vacuum phenomenon. IMPRESSION: 1. Interval worsening of T10 superior endplate compression fracture with 40% vertebral body height. Correlate with point tenderness to palpation for an acute component. 2. Multilevel chronic stable thoracolumbar fractures. 3. Diffusely decreased bone density. Electronically Signed   By: Morgane  Naveau M.D.   On: 12/20/2023 22:43   CT L-SPINE NO CHARGE Result Date: 12/20/2023 CLINICAL DATA:  fall EXAM: CT Thoracic and Lumbar spine without contrast TECHNIQUE: Multiplanar CT images of the thoracic and lumbar spine were reconstructed from contemporary CT of the Chest, Abdomen, and Pelvis. RADIATION DOSE REDUCTION: This exam was performed according to the  departmental dose-optimization program which includes automated exposure control, adjustment of the mA and/or kV according to patient size and/or use of iterative reconstruction technique. CONTRAST:  None or No additional COMPARISON:  None Available. FINDINGS: CT THORACIC SPINE FINDINGS Alignment: Normal. Vertebrae: Diffusely decreased bone density. C7-T1 anterior cervical discectomy and fusion surgical hardware. Interval worsening of T10 superior endplate compression fracture with 40% vertebral body height. T8, T9, T11, T12 chronic stable fractures. Kyphoplasty of T11. No acute fracture or focal pathologic process. Paraspinal and other soft tissues: Negative. Disc levels: Multilevel severe intervertebral disc space narrowing. CT LUMBAR SPINE FINDINGS Segmentation: 5 lumbar type vertebrae. Alignment: Normal. Vertebrae: Diffusely decreased bone density. Chronic stable L1, L2, L3, L4 fractures status post L1 and L4 kyphoplasty. No acute fracture or focal pathologic process. Paraspinal and other soft tissues: Negative. Disc levels: Multilevel intervertebral disc space vacuum phenomenon. IMPRESSION: 1. Interval worsening of T10 superior endplate compression fracture with 40% vertebral body height. Correlate with point tenderness to palpation for an acute component. 2. Multilevel chronic stable thoracolumbar fractures. 3. Diffusely decreased bone density. Electronically Signed   By: Morgane  Naveau M.D.   On: 12/20/2023 22:43   CT CHEST ABDOMEN PELVIS WO CONTRAST Result Date: 12/20/2023 CLINICAL DATA:  Polytrauma, blunt fall, back pain, hlp pain. hit head. eliquis  use. EXAM: CT CHEST, ABDOMEN AND PELVIS WITHOUT CONTRAST TECHNIQUE: Multidetector CT imaging of the chest, abdomen and pelvis was performed following the standard protocol without IV contrast. RADIATION DOSE REDUCTION: This exam was performed according to the departmental dose-optimization program which includes automated exposure control, adjustment of the  mA and/or kV according to patient size and/or use of iterative reconstruction technique. COMPARISON:  CT chest abdomen pelvis 11/22/2022, CT pelvis 06/08/2011, CT abdomen pelvis 05/30/2017 FINDINGS: CHEST: Cardiovascular: Stable aneurysmal ascending thoracic aorta measuring up to 4.6 cm. The heart is normal in size. No significant pericardial effusion. Severe atherosclerotic plaque. Mitral annular calcification. Coronary artery calcification. Lungs/Pleura: Left lower lobe passive atelectasis. No focal consolidation. No pulmonary nodule. No pulmonary mass. No pulmonary contusion or laceration. No pneumatocele formation. No pleural effusion. No pneumothorax. No hemothorax. Mediastinum/Nodes: No pneumomediastinum. The central airways are patent. The esophagus is unremarkable. Stable large hiatal hernia containing the entire stomach. The thyroid  is unremarkable. Limited evaluation for hilar lymphadenopathy on this noncontrast study. No mediastinal or axillary lymphadenopathy. Musculoskeletal/Chest wall No chest wall mass. Diffusely decreased bone density. Old healed right rib fractures. No acute rib or sternal fracture. Please see separately dictated CT thoracolumbar spine 12/20/2023. ABDOMEN / PELVIS: Hepatobiliary: Not enlarged. Hyperdense hepatic parenchyma.  No focal lesion. The gallbladder is otherwise unremarkable with no radio-opaque gallstones. No  biliary ductal dilatation. Pancreas: Normal pancreatic contour. No main pancreatic duct dilatation. Spleen: Not enlarged. No focal lesion. Adrenals/Urinary Tract: No nodularity bilaterally. No hydroureteronephrosis. No nephroureterolithiasis. No contour deforming renal mass. The urinary bladder is unremarkable. Stomach/Bowel: No small or large bowel wall thickening or dilatation. Stool throughout the majority of the colon. The appendix is unremarkable. Vasculature/Lymphatic: Moderate atherosclerotic plaque. No abdominal aorta or iliac aneurysm. No abdominal, pelvic,  inguinal lymphadenopathy. Reproductive: Normal. Other: No simple free fluid ascites. No pneumoperitoneum. No mesenteric hematoma identified. No organized fluid collection. Musculoskeletal: No significant soft tissue hematoma. Persistent densely sclerotic right iliac bone lesion. Diffusely decreased bone density. Acute impacted left femoral neck fracture. Please see separately dictated CT thoracolumbar spine 12/20/2023. Ports and Devices: None. IMPRESSION: 1. No acute intrathoracic, intra-abdominal, intrapelvic traumatic injury with limited evaluation on this noncontrast study. 2. Please see separately dictated CT thoracolumbar spine 12/20/2023. 3. Stable aneurysmal ascending thoracic aorta (4.6 cm). Ascending thoracic aortic aneurysm. Recommend semi-annual imaging followup by CTA or MRA and referral to cardiothoracic surgery if not already obtained. This recommendation follows 2010 ACCF/AHA/AATS/ACR/ASA/SCA/SCAI/SIR/STS/SVM Guidelines for the Diagnosis and Management of Patients With Thoracic Aortic Disease. Circulation. 2010; 121: [id number]. Aortic aneurysm NOS (ICD10-I71.9). Aortic aneurysm NOS (ICD10-I71.9). 4. Aortic Atherosclerosis (ICD10-I70.0) including coronary artery and mitral annular calcification. 5. Acute impacted left femoral neck fracture. 6. Constipation. Electronically Signed   By: Morgane  Naveau M.D.   On: 12/20/2023 22:29   DG Ankle Complete Right Result Date: 12/20/2023 CLINICAL DATA:  Status post fall. EXAM: RIGHT ANKLE - COMPLETE 3+ VIEW COMPARISON:  None Available. FINDINGS: There is no evidence of fracture, dislocation, or joint effusion. There is no evidence of arthropathy or other focal bone abnormality. Moderate severity soft tissue swelling is seen along the medial aspect of the distal right tibial shaft. IMPRESSION: Medial soft tissue swelling without evidence of acute fracture or dislocation. Electronically Signed   By: Suzen Dials M.D.   On: 12/20/2023 22:21   DG  Tibia/Fibula Right Result Date: 12/20/2023 CLINICAL DATA:  Status post fall. EXAM: RIGHT TIBIA AND FIBULA - 2 VIEW COMPARISON:  None Available. FINDINGS: There is no evidence of fracture or other focal bone lesions. Mild to moderate severity focal soft tissue swelling is seen along the medial aspect of the distal right tibial shaft. IMPRESSION: Focal soft tissue swelling along the medial aspect of the distal right tibial shaft. Electronically Signed   By: Suzen Dials M.D.   On: 12/20/2023 22:19   DG Hip [place] or [place] Pelvis 1 View Left Result Date: 12/20/2023 CLINICAL DATA:  Status post fall. EXAM: DG HIP (WITH OR WITHOUT PELVIS) 1V PORT LEFT COMPARISON:  None Available. FINDINGS: There is no evidence of an acute hip fracture or dislocation. Mild degenerative changes are seen in the form of joint space narrowing and acetabular sclerosis. IMPRESSION: Mild degenerative changes without evidence of an acute fracture or dislocation. Electronically Signed   By: Suzen Dials M.D.   On: 12/20/2023 22:18   CT HEAD WO CONTRAST ( ) Result Date: 12/20/2023 CLINICAL DATA:  Polytrauma, blunt fall; Polytrauma, blunt EXAM: CT HEAD WITHOUT CONTRAST CT CERVICAL SPINE WITHOUT CONTRAST TECHNIQUE: Multidetector CT imaging of the head and cervical spine was performed following the standard protocol without intravenous contrast. Multiplanar CT image reconstructions of the cervical spine were also generated. RADIATION DOSE REDUCTION: This exam was performed according to the departmental dose-optimization program which includes automated exposure control, adjustment of the mA and/or kV according to patient size and/or use  of iterative reconstruction technique. COMPARISON:  None Available. FINDINGS: CT HEAD FINDINGS Brain: Cerebral ventricle sizes are concordant with the degree of cerebral volume loss. Patchy and confluent areas of decreased attenuation are noted throughout the deep and periventricular white matter of  the cerebral hemispheres bilaterally, compatible with chronic microvascular ischemic disease. Scattered cerebral parenchyma calcifications. No evidence of large-territorial acute infarction. No parenchymal hemorrhage. No mass lesion. No extra-axial collection. No mass effect or midline shift. No hydrocephalus. Basilar cisterns are patent. Vascular: No hyperdense vessel. Skull: No acute fracture or focal lesion. Sinuses/Orbits: Almost complete opacification of left maxillary sinus. Otherwise paranasal sinuses and mastoid air cells are clear. Bilateral lens replacement. Otherwise the orbits are unremarkable. Other: None. CT CERVICAL SPINE FINDINGS Alignment: Grade 1 anterolisthesis of C4 on C5. Skull base and vertebrae: C7-T1 anterior cervical discectomy and fusion surgical hardware. Multilevel moderate severe degenerative changes of the spine. No associated severe osseous central canal stenosis. Associated moderate to severe left C5-C6 osseous neural foraminal stenosis. No acute fracture. No aggressive appearing focal osseous lesion or focal pathologic process. Soft tissues and spinal canal: No prevertebral fluid or swelling. No visible canal hematoma. Upper chest: Unremarkable. Other: Atherosclerotic plaque. IMPRESSION: 1. No acute intracranial abnormality. 2. No acute displaced fracture or traumatic listhesis of the cervical spine. 3. Scattered cerebral parenchyma calcifications. Findings likely sequelae of prior infection/inflammation. 4.  Aortic Atherosclerosis (ICD10-I70.0). Electronically Signed   By: Morgane  Naveau M.D.   On: 12/20/2023 22:05   CT Cervical Spine Wo Contrast Result Date: 12/20/2023 CLINICAL DATA:  Polytrauma, blunt fall; Polytrauma, blunt EXAM: CT HEAD WITHOUT CONTRAST CT CERVICAL SPINE WITHOUT CONTRAST TECHNIQUE: Multidetector CT imaging of the head and cervical spine was performed following the standard protocol without intravenous contrast. Multiplanar CT image reconstructions of the  cervical spine were also generated. RADIATION DOSE REDUCTION: This exam was performed according to the departmental dose-optimization program which includes automated exposure control, adjustment of the mA and/or kV according to patient size and/or use of iterative reconstruction technique. COMPARISON:  None Available. FINDINGS: CT HEAD FINDINGS Brain: Cerebral ventricle sizes are concordant with the degree of cerebral volume loss. Patchy and confluent areas of decreased attenuation are noted throughout the deep and periventricular white matter of the cerebral hemispheres bilaterally, compatible with chronic microvascular ischemic disease. Scattered cerebral parenchyma calcifications. No evidence of large-territorial acute infarction. No parenchymal hemorrhage. No mass lesion. No extra-axial collection. No mass effect or midline shift. No hydrocephalus. Basilar cisterns are patent. Vascular: No hyperdense vessel. Skull: No acute fracture or focal lesion. Sinuses/Orbits: Almost complete opacification of left maxillary sinus. Otherwise paranasal sinuses and mastoid air cells are clear. Bilateral lens replacement. Otherwise the orbits are unremarkable. Other: None. CT CERVICAL SPINE FINDINGS Alignment: Grade 1 anterolisthesis of C4 on C5. Skull base and vertebrae: C7-T1 anterior cervical discectomy and fusion surgical hardware. Multilevel moderate severe degenerative changes of the spine. No associated severe osseous central canal stenosis. Associated moderate to severe left C5-C6 osseous neural foraminal stenosis. No acute fracture. No aggressive appearing focal osseous lesion or focal pathologic process. Soft tissues and spinal canal: No prevertebral fluid or swelling. No visible canal hematoma. Upper chest: Unremarkable. Other: Atherosclerotic plaque. IMPRESSION: 1. No acute intracranial abnormality. 2. No acute displaced fracture or traumatic listhesis of the cervical spine. 3. Scattered cerebral parenchyma  calcifications. Findings likely sequelae of prior infection/inflammation. 4.  Aortic Atherosclerosis (ICD10-I70.0). Electronically Signed   By: Morgane  Naveau M.D.   On: 12/20/2023 22:05    Scheduled Meds:  acetaminophen   1,000 mg Oral TID   amiodarone   200 mg Oral Daily   apixaban   2.5 mg Oral BID   diltiazem   120 mg Oral Daily   docusate sodium   100 mg Oral BID   docusate sodium   200 mg Oral Daily   feeding supplement  237 mL Oral BID BM   fesoterodine   4 mg Oral Daily   [START ON 12/23/2023] furosemide   20 mg Oral Daily   levothyroxine   125 mcg Oral QAC breakfast   multivitamin with minerals  1 tablet Oral Daily   mupirocin  ointment  1 Application Nasal BID   pantoprazole   40 mg Oral Daily   polyethylene glycol  17 g Oral Daily   [START ON 12/23/2023] spironolactone   12.5 mg Oral Daily    Continuous Infusions:       LOS: 1 day     Trenda Mar, MD,  FACP, [facility], [facility], [facility]   [facility]  Hospitalist & Physician Advisor [place]      To contact the attending provider between 7A-7P or the covering provider during after hours 7P-7A, please log into the web site [email or web address] and access using universal [place] password for that web site. If you do not have the password, please call the hospital operator.  12/22/2023, 2:07 PM

## 2023-12-22 NOTE — Plan of Care (Signed)
   Problem: Education: Goal: Knowledge of General Education information will improve Description: Including pain rating scale, medication(s)/side effects and non-pharmacologic comfort measures Outcome: Progressing   Problem: Health Behavior/Discharge Planning: Goal: Ability to manage health-related needs will improve Outcome: Progressing   Problem: Clinical Measurements: Goal: Will remain free from infection Outcome: Progressing

## 2023-12-23 DIAGNOSIS — S22070D Wedge compression fracture of T9-T10 vertebra, subsequent encounter for fracture with routine healing: Secondary | ICD-10-CM | POA: Diagnosis not present

## 2023-12-23 DIAGNOSIS — S72002D Fracture of unspecified part of neck of left femur, subsequent encounter for closed fracture with routine healing: Secondary | ICD-10-CM | POA: Diagnosis not present

## 2023-12-23 DIAGNOSIS — D696 Thrombocytopenia, unspecified: Secondary | ICD-10-CM | POA: Diagnosis not present

## 2023-12-23 LAB — COMPREHENSIVE METABOLIC PANEL
ALT: 28 U/L (ref 0–44)
AST: 53 U/L — ABNORMAL HIGH (ref 15–41)
Albumin: 2.9 g/dL — ABNORMAL LOW (ref 3.5–5.0)
Alkaline Phosphatase: 39 U/L (ref 38–126)
Anion gap: 6 (ref 5–15)
BUN: 25 mg/dL — ABNORMAL HIGH (ref 8–23)
CO2: 27 mmol/L (ref 22–32)
Calcium: 8.5 mg/dL — ABNORMAL LOW (ref 8.9–10.3)
Chloride: 98 mmol/L (ref 98–111)
Creatinine, Ser: 0.98 mg/dL (ref 0.44–1.00)
GFR, Estimated: 57 mL/min — ABNORMAL LOW (ref >60)
Glucose, Bld: 101 mg/dL — ABNORMAL HIGH (ref 70–99)
Potassium: 4.7 mmol/L (ref 3.5–5.1)
Sodium: 131 mmol/L — ABNORMAL LOW (ref 135–145)
Total Bilirubin: 0.8 mg/dL (ref 0.0–1.2)
Total Protein: 5.7 g/dL — ABNORMAL LOW (ref 6.5–8.1)

## 2023-12-23 LAB — CBC
HCT: 36 % (ref 36.0–46.0)
Hemoglobin: 12.4 g/dL (ref 12.0–15.0)
MCH: 30.9 pg (ref 26.0–34.0)
MCHC: 34.4 g/dL (ref 30.0–36.0)
MCV: 89.8 fL (ref 80.0–100.0)
Platelets: 136 10*3/uL — ABNORMAL LOW (ref 150–400)
RBC: 4.01 MIL/uL (ref 3.87–5.11)
RDW: 14.4 % (ref 11.5–15.5)
WBC: 12.5 10*3/uL — ABNORMAL HIGH (ref 4.0–10.5)
nRBC: 0 % (ref 0.0–0.2)

## 2023-12-23 MED ORDER — HYDROCODONE-ACETAMINOPHEN 5-325 MG PO TABS
1.0000 | ORAL_TABLET | Freq: Four times a day (QID) | ORAL | Status: DC | PRN
Start: 2023-12-23 — End: 2023-12-25
  Administered 2023-12-23 – 2023-12-25 (×5): 2 via ORAL
  Filled 2023-12-23 (×5): qty 2

## 2023-12-23 MED ORDER — ACETAMINOPHEN 325 MG PO TABS: 650.0000 mg | ORAL_TABLET | Freq: Four times a day (QID) | ORAL | Status: DC | PRN

## 2023-12-23 MED ORDER — OXYCODONE HCL 5 MG PO TABS: ORAL_TABLET | ORAL | 0 refills | Status: DC

## 2023-12-23 NOTE — Evaluation (Signed)
 Physical Therapy Evaluation  Patient Details Name: [name] MRN: [id number] DOB: 03-11-1936 Today's Date: 12/23/2023  History of Present Illness  Pt is an 88 y/o female who presents 12/20/2023 s/p fall at home, sustaining a femoral neck fracture and T10 compression fracture. Pt is now s/p fixation of femoral neck fx on 12/21/2023. Neurosurgery consulted for compression fx and recommended TLSO. On 12/23/23 neurosurgery PA, Sarah assessed brace fit and recommends LSO portion of brace only. PMH significant for Aortic regurgitation, breast CA s/p partial mastectomy 2012, CHF, L4 compression fracture s/p kyphoplasty 2018, kyphoplasty 2014, DVT, HTN, hypothyroidism, mitral valve prolapse, mitral regurgitation, tricuspid regurgitation, thoracic AAA, pulmonary HTN, a-fib, osteoporosis, ACDF 2016.   Clinical Impression  Pt admitted with above diagnosis. Pt currently with functional limitations due to the deficits listed below (see PT Problem List). At the time of PT eval pt was able to perform bed mobility and transfers with up to +2 total assist for physical assistance and multimodal cues. Pt with increased pain with any movement however was premedicated prior to session. Recommend post-acute rehab <3 hours/day to maximize functional independence, safety, and return to PLOF. Pt will benefit from acute skilled PT to increase their independence and safety with mobility to allow discharge.           If plan is discharge home, recommend the following: Two people to help with walking and/or transfers;Two people to help with bathing/dressing/bathroom;Assistance with cooking/housework;Assist for transportation;Help with stairs or ramp for entrance   Can travel by private vehicle   No    Equipment Recommendations Rolling walker (2 wheels)  Recommendations for Other Services       Functional Status Assessment Patient has had a recent decline in their functional status and demonstrates the ability to make  significant improvements in function in a reasonable and predictable amount of time.     Precautions / Restrictions Precautions Precautions: Fall;Back Precaution Booklet Issued: No Precaution Comments: Verbally reviewed precautions during functional mobility. Required Braces or Orthoses: Spinal Brace Spinal Brace: Lumbar corset;Applied in sitting position;Other (comment) Spinal Brace Comments: Order for TLSO; neurosurgery PA in room at end of session and  converted brace to LSO; PA reports if pt cannot tolerate brace secondary to kyphosis/scoliosis, pt can mobilize without the brace. Restrictions Weight Bearing Restrictions Per Provider Order: Yes Other Position/Activity Restrictions: WBAT LLE      Mobility  Bed Mobility Overal bed mobility: Needs Assistance Bed Mobility: Rolling, Sidelying to Sit Rolling: Total assist Sidelying to sit: Total assist       General bed mobility comments: Heavy assist required for all aspects of bed mobility. Log roll to EOB with HOB elevated and increased pain. Encouraged use of rails and bed pad utilized for rolling/scooting.    Transfers Overall transfer level: Needs assistance Equipment used: 2 person hand held assist Transfers: Sit to/from Stand, Bed to chair/wheelchair/BSC Sit to Stand: Total assist, +2 physical assistance, +2 safety/equipment Stand pivot transfers: Total assist, +2 physical assistance, +2 safety/equipment         General transfer comment: Increased pain, requiring heavy +2 assist for power up to full stand. Therapists facilitated weight shifting in order to advance RLE, however pt with difficulty to advance LLE. Therapists facilitated pivot around to chair. Pt able to reach back for arm rests of the chair prior to sit.    Ambulation/Gait               General Gait Details: Unable to progress to gait training at  this time.  Stairs            Wheelchair Mobility     Tilt Bed    Modified Rankin (Stroke  Patients Only)       Balance Overall balance assessment: Needs assistance Sitting-balance support: Bilateral upper extremity supported, Feet supported Sitting balance-Leahy Scale: Poor     Standing balance support: Bilateral upper extremity supported, During functional activity Standing balance-Leahy Scale: Zero Standing balance comment: +2                             Pertinent Vitals/Pain Pain Assessment Pain Assessment: Faces Pain Score:  (Between moderate and high pain) Faces Pain Scale: Hurts whole lot Pain Location: stomach, back, L hip Pain Descriptors / Indicators: Aching, Sore, Operative site guarding (Pt reports abdominal pain and needing to have a BM) Pain Intervention(s): Limited activity within patient's tolerance, Monitored during session, Repositioned    Home Living Family/patient expects to be discharged to:: Private residence Living Arrangements: Alone Available Help at Discharge: Personal care attendant (5 days/week 9-1) Type of Home: House Home Access: Ramped entrance       Home Layout: One level Home Equipment: Educational psychologist (4 wheels) (no seat on rollator)      Prior Function Prior Level of Function : Needs assist;History of Falls (last six months) (Does not drive)             Mobility Comments: independent with the walker. ADLs Comments: Aide assists with laundry, shower, dressing. Fixing meals by herself.     Extremity/Trunk Assessment   Upper Extremity Assessment Upper Extremity Assessment: Defer to OT evaluation    Lower Extremity Assessment Lower Extremity Assessment: LLE deficits/detail;Generalized weakness LLE Deficits / Details: Generally weak bilaterally but now with acute pain, decreased strength and AROM consistent with above mentioned injury and subsequent surgery. LLE: Unable to fully assess due to pain    Cervical / Trunk Assessment Cervical / Trunk Assessment: Kyphotic;Other exceptions Cervical / Trunk  Exceptions: Scoliosis, T10 compression fracture.  Communication   Communication Communication: No apparent difficulties Cueing Techniques: Verbal cues;Gestural cues;Tactile cues  Cognition Arousal: Alert Behavior During Therapy: WFL for tasks assessed/performed Overall Cognitive Status: No family/caregiver present to determine baseline cognitive functioning                                 General Comments: Pt follows one step commands. Has good recall of events leading to admission. oriented. decreased overall safety awareness        General Comments General comments (skin integrity, edema, etc.): VSS. Neurosurgery NP in room reports ok to go without thoracic portion of brace and wear LSO for now but if too uncomfortable may go without it    Exercises     Assessment/Plan    PT Assessment Patient needs continued PT services  PT Problem List Decreased strength;Decreased range of motion;Decreased activity tolerance;Decreased balance;Decreased mobility;Decreased knowledge of use of DME;Decreased safety awareness;Decreased knowledge of precautions;Pain       PT Treatment Interventions DME instruction;Gait training;Functional mobility training;Therapeutic activities;Therapeutic exercise;Cognitive remediation;Patient/family education;Balance training    PT Goals (Current goals can be found in the Care Plan section)  Acute Rehab PT Goals Patient Stated Goal: Eventually be able to return to her home. PT Goal Formulation: With patient Time For Goal Achievement: 01/06/24 Potential to Achieve Goals: Good    Frequency Min 1X/week  Co-evaluation PT/OT/SLP Co-Evaluation/Treatment: Yes Reason for Co-Treatment: Complexity of the patient's impairments (multi-system involvement);Necessary to address cognition/behavior during functional activity;For patient/therapist safety;To address functional/ADL transfers PT goals addressed during session: Mobility/safety with  mobility;Balance;Strengthening/ROM OT goals addressed during session: Strengthening/ROM;ADL's and self-care       AM-PAC PT "6 Clicks" Mobility  Outcome Measure Help needed turning from your back to your side while in a flat bed without using bedrails?: Total Help needed moving from lying on your back to sitting on the side of a flat bed without using bedrails?: Total Help needed moving to and from a bed to a chair (including a wheelchair)?: Total Help needed standing up from a chair using your arms (e.g., wheelchair or bedside chair)?: Total Help needed to walk in hospital room?: Total Help needed climbing 3-5 steps with a railing? : Total 6 Click Score: 6    End of Session Equipment Utilized During Treatment: Gait belt;Back brace Activity Tolerance: Patient limited by pain Patient left: in chair;with call bell/phone within reach;with chair alarm set Nurse Communication: Mobility status;Need for lift equipment PT Visit Diagnosis: Unsteadiness on feet (R26.81);Pain Pain - part of body:  (back)    Time: 2956-2130 PT Time Calculation (min) (ACUTE ONLY): 41 min   Charges:   PT Evaluation $PT Eval Moderate Complexity: 1 Mod   PT General Charges $$ ACUTE PT VISIT: 1 Visit         Simone Dubois, PT, DPT Acute Rehabilitation Services Secure Chat Preferred Office: [phone]   Venus Ginsberg 12/23/2023, 2:48 PM

## 2023-12-23 NOTE — Plan of Care (Signed)

## 2023-12-23 NOTE — Evaluation (Signed)
 Occupational Therapy Evaluation Patient Details Name: [name] MRN: [id number] DOB: 1935/11/21 Today's Date: 12/23/2023   History of Present Illness Pt is an 88 y/o female who presents 12/20/2023 s/p fall at home, sustaining a femoral neck fracture and T10 compression fracture. Pt is now s/p fixation of femoral neck fx on 12/21/2023. Neurosurgery consulted for compression fx and recommended TLSO. On 12/23/23 neurosurgery PA, Sarah assessed brace fit and recommends LSO portion of brace only. PMH significant for HTN, PAF, chronic diastolic CHF.    Clinical Impression    PTA, pt lived alone with assistance of aide for bathing/dressing/laundry 5 days/week from [phone]. Upon eval, pt with LLE pain, back pain, and stomach pain secondary to constipation. Pt requiring up to total A +2 for all mobility and with poor tolerance of mobilizing this session. Donned TLSO per orders and neurosurgery PA in room at end of session converted to LSO to be worn OOB as pt able to tolerate. Pt with significant decline in functional status. Patient will benefit from continued inpatient follow up therapy, <3 hours/day     If plan is discharge home, recommend the following: Two people to help with walking and/or transfers;Two people to help with bathing/dressing/bathroom;Assistance with cooking/housework;Assist for transportation;Help with stairs or ramp for entrance    Functional Status Assessment  Patient has had a recent decline in their functional status and demonstrates the ability to make significant improvements in function in a reasonable and predictable amount of time.  Equipment Recommendations  Other (comment) (defer)    Recommendations for Other Services       Precautions / Restrictions Precautions Precautions: Fall;Back Precaution Booklet Issued: No Precaution Comments: reviewed log roll method and recommendations for brace Required Braces or Orthoses: Spinal Brace Spinal Brace:  (TLSO orders;  neurosurgery NP in room at end of sessio converted brace to LSO; reporting if pt cannot tolerate brace secondary to kyphosis/scoliosis, may be best to mobilize without brace) Restrictions Weight Bearing Restrictions Per Provider Order: Yes Other Position/Activity Restrictions: WBAT LLE      Mobility Bed Mobility Overal bed mobility: Needs Assistance Bed Mobility: Rolling, Sidelying to Sit Rolling: Total assist Sidelying to sit: Total assist       General bed mobility comments: assist for all parts, step by step instruction. very painful throughout    Transfers Overall transfer level: Needs assistance Equipment used: 2 person hand held assist Transfers: Sit to/from Stand, Bed to chair/wheelchair/BSC Sit to Stand: Total assist, +2 physical assistance, +2 safety/equipment Stand pivot transfers: Total assist, +2 physical assistance, +2 safety/equipment         General transfer comment: Pt very weak needing step by step cues and lifting assist to rise up from EOB as well as assist for weight shift and step  by step cues to pivot to chair. fair participation overall but performing <25% of the work      Balance Overall balance assessment: Needs assistance Sitting-balance support: Bilateral upper extremity supported, Feet supported Sitting balance-Leahy Scale: Poor     Standing balance support: Bilateral upper extremity supported, During functional activity Standing balance-Leahy Scale: Zero Standing balance comment: reliant on OT/PT                           ADL either performed or assessed with clinical judgement   ADL Overall ADL's : Needs assistance/impaired Eating/Feeding: Set up;Bed level   Grooming: Set up;Bed level   Upper Body Bathing: Moderate assistance;Sitting   Lower  Body Bathing: Total assistance;+2 for physical assistance;+2 for safety/equipment   Upper Body Dressing : Sitting;Total assistance Upper Body Dressing Details (indicate cue type and  reason): to don TLSO Lower Body Dressing: Total assistance;+2 for physical assistance;+2 for safety/equipment   Toilet Transfer: Total assistance;+2 for physical assistance;+2 for safety/equipment           Functional mobility during ADLs: Total assistance;+2 for physical assistance;+2 for safety/equipment General ADL Comments: 2 person HHA     Vision Baseline Vision/History: 1 Wears glasses Ability to See in Adequate Light: 0 Adequate Patient Visual Report: No change from baseline Vision Assessment?: No apparent visual deficits     Perception Perception: Not tested       Praxis Praxis: Not tested       Pertinent Vitals/Pain Pain Assessment Pain Assessment: Faces Faces Pain Scale: Hurts whole lot Pain Location: stomach, back, L hip Pain Descriptors / Indicators: Aching, Sore Pain Intervention(s): Limited activity within patient's tolerance, Monitored during session, Premedicated before session     Extremity/Trunk Assessment Upper Extremity Assessment Upper Extremity Assessment: Generalized weakness   Lower Extremity Assessment Lower Extremity Assessment: Defer to PT evaluation   Cervical / Trunk Assessment Cervical / Trunk Assessment: Kyphotic;Other exceptions Cervical / Trunk Exceptions: scoliosis, T10 fx   Communication Communication Communication: No apparent difficulties Cueing Techniques: Verbal cues;Tactile cues   Cognition Arousal: Alert Behavior During Therapy: WFL for tasks assessed/performed Overall Cognitive Status: No family/caregiver present to determine baseline cognitive functioning                                 General Comments: Pt follows one step commands. Has good recall of events leading to admission. oriented. decreased overall safety awareness     General Comments  VSS. Neurosurgery NP in room reports ok to go without thoracic portion of brace and wear LSO for now but if too uncomfortable may go without it    Exercises      Shoulder Instructions      Home Living Family/patient expects to be discharged to:: Private residence Living Arrangements: Alone Available Help at Discharge: Personal care attendant (5 days/week 9-1) Type of Home: House Home Access: Ramped entrance     Home Layout: One level     Bathroom Shower/Tub: Producer, television/film/video: Handicapped height     Home Equipment: Educational psychologist (4 wheels) (no seat on rollator)          Prior Functioning/Environment Prior Level of Function : Needs assist;History of Falls (last six months) (Does not drive)             Mobility Comments: independent with the walker. ADLs Comments: Aide assists with laundry, shower, dressing. Fixing meals by herself.        OT Problem List: Decreased strength;Decreased activity tolerance;Impaired balance (sitting and/or standing);Decreased safety awareness;Decreased knowledge of use of DME or AE;Pain      OT Treatment/Interventions: Self-care/ADL training;Therapeutic exercise;DME and/or AE instruction;Balance training;Patient/family education;Therapeutic activities;Cognitive remediation/compensation    OT Goals(Current goals can be found in the care plan section) Acute Rehab OT Goals Patient Stated Goal: get better OT Goal Formulation: With patient Time For Goal Achievement: 01/06/24 Potential to Achieve Goals: Good  OT Frequency: Min 1X/week    Co-evaluation PT/OT/SLP Co-Evaluation/Treatment: Yes Reason for Co-Treatment: Complexity of the patient's impairments (multi-system involvement);Necessary to address cognition/behavior during functional activity;For patient/therapist safety;To address functional/ADL transfers PT goals addressed during session: Mobility/safety with mobility;Balance;Strengthening/ROM  OT goals addressed during session: Strengthening/ROM;ADL's and self-care      AM-PAC OT "6 Clicks" Daily Activity     Outcome Measure Help from another person eating meals?:  A Little Help from another person taking care of personal grooming?: A Little Help from another person toileting, which includes using toliet, bedpan, or urinal?: Total Help from another person bathing (including washing, rinsing, drying)?: Total Help from another person to put on and taking off regular upper body clothing?: Total Help from another person to put on and taking off regular lower body clothing?: Total 6 Click Score: 10   End of Session Equipment Utilized During Treatment: Gait belt;Back brace Nurse Communication: Mobility status  Activity Tolerance: Patient tolerated treatment well;Patient limited by pain Patient left: in chair;with call bell/phone within reach;with chair alarm set  OT Visit Diagnosis: Unsteadiness on feet (R26.81);Muscle weakness (generalized) (M62.81);History of falling (Z91.81);Pain Pain - Right/Left: Left Pain - part of body: Hip (back, stomach)                Time: 9604-5409 OT Time Calculation (min): 40 min Charges:  OT General Charges $OT Visit: 1 Visit OT Evaluation $OT Eval Moderate Complexity: 1 Mod OT Treatments $Self Care/Home Management : 8-22 mins  Karilyn Ouch, OTR/L [facility] Acute Rehabilitation Office: [phone]   Emery Hans 12/23/2023, 2:24 PM

## 2023-12-23 NOTE — Progress Notes (Signed)
   ORTHOPAEDIC PROGRESS NOTE  s/p Procedure(s): PERCUTANEOUS FIXATION OF FEMORAL NECK on 2/8 with Dr. Yvonne Hering  SUBJECTIVE: Reports mild pain about operative site. She complains more of the pain in her back. She is unable to get comfortable due to the pain in her back. No chest pain. No SOB. No nausea/vomiting. No other complaints.  OBJECTIVE: PE: LLE: dressing CDI, leg lengths equal, warm well perfused foot, intact EHL/TA/GSC   Vitals:   12/23/23 0415 12/23/23 0714  BP: 100/85 (!) 131/99  Pulse: 84 78  Resp: 18 16  Temp: 97.8 F (36.6 C) 98.5 F (36.9 C)  SpO2: 98% 100%   Stable post-op images.   ASSESSMENT: [name] is a 88 y.o. female POD#2  PLAN: Weightbearing: WBAT LLE Insicional and dressing care: Reinforce dressings as needed Orthopedic device(s): None Showering: hold for now VTE prophylaxis: Resume Eliquis  Pain control: PRN pain medications, minimize narcotics as able Dispo: Anticipate SNF Follow - up plan: 2 weeks in office Contact information:  Weekdays 8-5 Dr. Grafton Lawrence, Nicholas Bari PA-C, After hours and holidays please check [email or web address] for group call information for [facility]   Nicholas Bari PA-C 12/23/2023

## 2023-12-23 NOTE — Progress Notes (Signed)
 PROGRESS NOTE   [name]  ZOX:[id number]    DOB: 12-25-1935    DOA: 12/20/2023  PCP: Jearldine Mina, MD   I have briefly reviewed patients previous medical records in [facility].  Chief Complaint  Patient presents with   Fall   Hip Pain    Brief Hospital Course:  88 year old female with medical history significant for paroxysmal A-fib, chronic diastolic CHF, DVT on Coumadin, hypertension, hypothyroidism, presented to the ED following a mechanical fall at home.  She was attempting to place something into the refrigerator, lost her grip and fell onto her left side with prominent pain.  Admitted for close left hip fracture and hematoma of right lower leg.  Orthopedic trauma and trauma surgery consulted.  She underwent percutaneous fixation of left femoral neck fracture by Dr. Yvonne Hering on 2/8.  Neurosurgery was consulted for T10 compression fracture, managing conservatively with LSO brace.   Assessment & Plan:  Principal Problem:   Closed hip fracture, left, initial encounter (HCC) Active Problems:   Leukocytosis   Hypokalemia   Traumatic hematoma of lower leg, right, initial encounter   History of blood clots   Essential hypertension   Hypothyroidism   PAF (paroxysmal atrial fibrillation) (HCC)   Chronic diastolic CHF (congestive heart failure) (HCC)   DNR (do not resuscitate)/DNI(Do Not intubate)   Closed left hip fracture/left impacted femoral neck fracture Orthopedics consulted and s/p percutaneous fixation by Dr. Yvonne Hering on 2/8. Management per orthopedics: WBAT LLE, continue Eliquis  2.5 Mg twice daily which would serve DVT prophylaxis as well, multimodality pain control as allowed by patient (patient insists on taking only her home Norco, states that the IV morphine  does not help but unwilling to increase the dose or change to alternate IV opioids/Dilaudid  or fentanyl  or try IV Toradol ), outpatient follow-up in office in 14 to 21 days for incision check, x-ray and  suture removal. She appears to have intolerance to Robaxin  and Zanaflex and hence did not start. Eventual SNF.  Acute on chronic T10 compression fracture Patient complaining of low mid back pain. Consulted neurosurgery and their input appreciated.  They indicate that she has history of numerous compression fractures, some of which are s/p kyphoplasty.  Initially they had recommended TLSO but given her kyphosis, that is quite uncomfortable and therefore they now recommend LSO portion as needed. No focal neurological deficits.  Traumatic right lower leg hematoma Monitor.  Stable.  Postop anemia and thrombocytopenia Hemoglobin normal today.  Thrombocytopenia slightly better.  Mechanical fall CT C/A/P: No acute traumatic injury.  Hypokalemia Replaced   Leukocytosis Likely due to demargination from stress of fracture. No evidence of any infection. Continues to improve spontaneously without any specific intervention.   Chronic diastolic CHF (congestive heart failure) (HCC) Compensated.  Continue Lasix  and Aldactone .   PAF (paroxysmal atrial fibrillation) (HCC) Continue home dose of diltiazem  CD120 mg daily (did not get yesterday's dose due to soft blood pressures) and amiodarone  200 Mg daily. Eliquis  was held preop and started back on postop day 1.   Hypothyroidism Continue synthroid  125 mcg daily.   Essential hypertension Continue Cardizem .   History of blood clots Continue Eliquis  2.5 Mg twice daily.  Mild transaminitis Unclear etiology.  CK normal. Improved.  Stable aneurysmal ascending thoracic aorta (4.6 cm) Noted on CT C/A/P 2/7 Recommend semiannual imaging follow-up by CTA or MRA and referral to cardiothoracic surgery if not already done.  Outpatient follow-up.  Constipation Noted on CT A/P Aggressive bowel regimen.  LBM 2/8  Body mass index is 18.77 kg/m.  Nutritional Status Nutrition Problem: Increased nutrient needs Etiology: hip fracture, post-op  healing Signs/Symptoms: estimated needs Interventions: Ensure Enlive (each supplement provides 350kcal and 20 grams of protein), MVI  DVT prophylaxis: apixaban  (ELIQUIS ) tablet 2.5 mg Start: 12/22/23 1000 SCDs Start: 12/21/23 1224 SCDs Start: 12/21/23 0304     Code Status: Limited: Do not attempt resuscitation (DNR) -DNR-LIMITED -Do Not Intubate/DNI :  Family Communication: None at bedside Disposition:  Status is: Inpatient Remains inpatient appropriate because: Immediate postop, needs therapies evaluation and likely will need SNF.     Consultants:   Orthopedics Trauma surgery  Procedures:   As noted above  Antimicrobials:      Subjective:  Reports pain all over.  Then reports pain of her left hip and low back.  States that current pain medications are not helping her but unwilling to allow making changes to anything else.  Objective:   Vitals:   12/23/23 0126 12/23/23 0415 12/23/23 0714 12/23/23 1558  BP:  100/85 (!) 131/99 105/85  Pulse: 80 84 78 83  Resp:  18 16 16   Temp:  97.8 F (36.6 C) 98.5 F (36.9 C) 98.2 F (36.8 C)  TempSrc:  Oral Oral Oral  SpO2: 93% 98% 100% 96%  Weight:      Height:        General exam: Elderly female, moderately built, thinly nourished lying propped up in bed, looked somewhat uncomfortable from pain but no respiratory distress. Respiratory system: Clear to auscultation.  No increased work of breathing. Cardiovascular system: S1 & S2 heard, RRR.  No JVD or pedal edema.  2/6 SEM best heard at apex.  No pedal edema.  Stable without change. Gastrointestinal system: Abdomen is nondistended, soft and nontender. No organomegaly or masses felt. Normal bowel sounds heard. Central nervous system: Alert and oriented.  No focal neurological deficits. Extremities: Small area of hematoma over right lower anterior shin with mild tenderness.  Remains stable.  Compartments soft. Left hip postop dressing site clean and dry. Bilateral lower extremity  at least grade 3 x 5 power, full power assessment limited secondary to pain. Skin: No rashes, lesions or ulcers.  Small hematoma noted over right lower leg anteriorly.  Details as in picture below from admission. Psychiatry: Judgement and insight appear somewhat impaired. Mood & affect overall irritable.       Data Reviewed:   I have personally reviewed following labs and imaging studies   CBC: Recent Labs  Lab 12/20/23 2220 12/21/23 0705 12/21/23 1332 12/22/23 0524 12/23/23 0624  WBC 20.0* 19.9* 18.1* 15.5* 12.5*  NEUTROABS 17.2* 17.8*  --   --   --   HGB 13.3 13.1 12.4 11.3* 12.4  HCT 37.9 36.7 35.7* 32.8* 36.0  MCV 89.4 88.4 88.6 87.7 89.8  PLT 170 170 151 124* 136*    Basic Metabolic Panel: Recent Labs  Lab 12/20/23 2220 12/21/23 0705 12/22/23 0524 12/23/23 0624  NA 138 136 133* 131*  K 3.3* 4.1 4.4 4.7  CL 98 101 100 98  CO2 25 22 24 27   GLUCOSE 141* 122* 105* 101*  BUN 26* 26* 29* 25*  CREATININE 0.97 0.90 0.88 0.98  CALCIUM  9.2 8.4* 8.3* 8.5*    Liver Function Tests: Recent Labs  Lab 12/20/23 2220 12/21/23 0705 12/22/23 0524 12/23/23 0624  AST 45* 49* 66* 53*  ALT 56* 60* 49* 28  ALKPHOS 47 46 36* 39  BILITOT 0.9 0.9 0.5 0.8  PROT 6.1* 6.0* 5.2* 5.7*  ALBUMIN  3.4* 3.3* 2.8* 2.9*    CBG: No results for input(s): "GLUCAP" in the last 168 hours.  Microbiology Studies:   Recent Results (from the past 240 hours)  Surgical PCR screen     Status: None   Collection Time: 12/21/23  3:22 AM   Specimen: Nasal Mucosa; Nasal Swab  Result Value Ref Range Status   MRSA, PCR NEGATIVE NEGATIVE Final   Staphylococcus aureus NEGATIVE NEGATIVE Final    Comment: (NOTE) The Xpert SA Assay (FDA approved for NASAL specimens in patients 32 years of age and older), is one component of a comprehensive surveillance program. It is not intended to diagnose infection nor to guide or monitor treatment. Performed at [facility], [address],  [place], [place] [address]     Radiology Studies:  No results found.   Scheduled Meds:    amiodarone   200 mg Oral Daily   apixaban   2.5 mg Oral BID   diltiazem   120 mg Oral Daily   docusate sodium   100 mg Oral BID   feeding supplement  237 mL Oral BID BM   fesoterodine   4 mg Oral Daily   furosemide   20 mg Oral Daily   iron  polysaccharides  150 mg Oral Once per day on Monday Wednesday Friday   levothyroxine   125 mcg Oral QAC breakfast   multivitamin with minerals  1 tablet Oral Daily   mupirocin  ointment  1 Application Nasal BID   pantoprazole   40 mg Oral Daily   polyethylene glycol  17 g Oral BID   pravastatin   20 mg Oral Q supper   spironolactone   12.5 mg Oral Daily    Continuous Infusions:       LOS: 2 days     Aubrey Blas, MD,  FACP, [facility], [facility], [facility]   [facility]  Hospitalist & Physician Advisor [place]      To contact the attending provider between 7A-7P or the covering provider during after hours 7P-7A, please log into the web site [email or web address] and access using universal [place] password for that web site. If you do not have the password, please call the hospital operator.  12/23/2023, 4:45 PM

## 2023-12-23 NOTE — TOC Initial Note (Signed)
 Transition of Care [facility] - Initial/Assessment Note    Patient Details  Name: [name] MRN: [id number] Date of Birth: Dec 04, 1935  [facility] CM/SW Contact:    Sherial Dimes, LCSW Phone Number: 12/23/2023, 10:11 AM  Clinical Narrative:                 CSW reviewed chart and acknowledges consult for SNF placement. At this time, pt has not been able to work with PT and is still undergoing medical workup. TOC will continue to follow for DC needs.         Patient Goals and CMS Choice            Expected Discharge Plan and Services                                              Prior Living Arrangements/Services                       Activities of Daily Living   ADL Screening (condition at time of admission) Independently performs ADLs?: Yes (appropriate for developmental age) Is the patient deaf or have difficulty hearing?: No Does the patient have difficulty seeing, even when wearing glasses/contacts?: No Does the patient have difficulty concentrating, remembering, or making decisions?: No  Permission Sought/Granted                  Emotional Assessment              Admission diagnosis:  Closed fracture of left hip, initial encounter (HCC) [S72.002A] Closed hip fracture, left, initial encounter (HCC) [S72.002A] Patient Active Problem List   Diagnosis Date Noted   Closed hip fracture, left, initial encounter (HCC) 12/21/2023   Traumatic hematoma of lower leg, right, initial encounter 12/21/2023   DNR (do not resuscitate)/DNI(Do Not intubate) 12/21/2023   Blood clotting disorder (HCC) 01/16/2023   Nonrheumatic tricuspid valve regurgitation 11/29/2022   Hiatal hernia 11/22/2022   CHF (congestive heart failure) (HCC) 11/22/2022   Trigger point of right shoulder region 10/23/2022   Aortic atherosclerosis (HCC) 05/04/2020   Lumbar compression fracture (HCC) 06/21/2017   Ascending aortic aneurysm (HCC) 03/27/2017    PAF (paroxysmal atrial fibrillation) (HCC) 12/21/2016   Chronic diastolic CHF (congestive heart failure) (HCC) 12/21/2016   Severe mitral regurgitation    Malnutrition of moderate degree 12/01/2016   Hypokalemia    Leukocytosis    Herniated cervical intervertebral disc 09/09/2015   Cervical radiculopathy    Neoplasm of right breast, primary tumor staging category Tis: ductal carcinoma in situ (DCIS) 09/03/2013   Dyspnea 01/21/2013   Bilateral leg edema 01/21/2013   History of DVT of lower extremity 01/21/2013   Back pain, lumbosacral 01/21/2013   Osteoporosis 01/21/2013   Essential hypertension 01/21/2013   Hypothyroidism 01/21/2013   History of blood clots    Cataract    PCP:  Jearldine Mina, MD Pharmacy:   [facility] [place], [place] - [address] [place] [address] Phone: [phone] Fax: [phone]  [facility] [address] [place] [address] Phone: [phone] Fax: [phone]     Social Drivers of Health (SDOH) Social History: SDOH Screenings   Food Insecurity: No Food Insecurity (12/21/2023)  Housing: Unknown (12/21/2023)  Transportation Needs: No Transportation  Needs (12/21/2023)  Utilities: Not At Risk (12/21/2023)  Social Connections: Unknown (12/21/2023)  Tobacco Use: Low Risk  (12/21/2023)   SDOH Interventions:     Readmission Risk Interventions     No data to display

## 2023-12-23 NOTE — Consult Note (Signed)
   Providing Compassionate, Quality Care - Together  Neurosurgery Consult  Referring physician: [facility] Reason for referral: Thoracic compression fx   History of Present Illness: Pt reportedly had a fall at home w/ subsequently increased mid/LBP, which can radiate around her rib cage. Workup has since revealed L hip fracture which required operative fixation, as well as worsening T10 compression fx. H/o numerous compression fractures, some of which are s/p kyphoplasty. She has a h/o chronic fluctuating TL pain at baseline.   Physical Exam:  Vital signs in last 24 hours: Temp:  [98 F (36.7 C)-98.3 F (36.8 C)] 98 F (36.7 C) (07/25 1814) Pulse Rate:  [58-128] 65 (07/26 0746) Resp:  [11-18] 14 (07/26 0217) BP: (138-182)/(65-125) 153/88 (07/26 0700) SpO2:  [91 %-98 %] 96 % (07/26 0746)  PE: Sitting comfortably in bedside chair MAEs Speech fluent, appropriate A&O   Impression/Assessment:  This is an 88 year old with acute on chronic T10 compression fracture without posterior element involvement.   Plan:  -LSO when OOB PRN. Given her kyphosis, TLSO is quite uncomfortable and given this fracture is not entirely acute, she can wear the LSO portion as needed.  -Pain control -Supportive care -Call w questions/concerns.   Aren Pryde Margaree Mackintosh, PA-C

## 2023-12-24 ENCOUNTER — Encounter (HOSPITAL_COMMUNITY): Payer: Self-pay | Admitting: Orthopaedic Surgery

## 2023-12-24 ENCOUNTER — Other Ambulatory Visit: Payer: Self-pay

## 2023-12-24 DIAGNOSIS — S22070D Wedge compression fracture of T9-T10 vertebra, subsequent encounter for fracture with routine healing: Secondary | ICD-10-CM | POA: Diagnosis not present

## 2023-12-24 DIAGNOSIS — S72002D Fracture of unspecified part of neck of left femur, subsequent encounter for closed fracture with routine healing: Secondary | ICD-10-CM | POA: Diagnosis not present

## 2023-12-24 DIAGNOSIS — E43 Unspecified severe protein-calorie malnutrition: Secondary | ICD-10-CM | POA: Insufficient documentation

## 2023-12-24 LAB — COMPREHENSIVE METABOLIC PANEL
ALT: 22 U/L (ref 0–44)
AST: 36 U/L (ref 15–41)
Albumin: 2.7 g/dL — ABNORMAL LOW (ref 3.5–5.0)
Alkaline Phosphatase: 40 U/L (ref 38–126)
Anion gap: 7 (ref 5–15)
BUN: 21 mg/dL (ref 8–23)
CO2: 27 mmol/L (ref 22–32)
Calcium: 8.4 mg/dL — ABNORMAL LOW (ref 8.9–10.3)
Chloride: 98 mmol/L (ref 98–111)
Creatinine, Ser: 0.88 mg/dL (ref 0.44–1.00)
GFR, Estimated: >60 mL/min (ref >60)
Glucose, Bld: 116 mg/dL — ABNORMAL HIGH (ref 70–99)
Potassium: 4.6 mmol/L (ref 3.5–5.1)
Sodium: 132 mmol/L — ABNORMAL LOW (ref 135–145)
Total Bilirubin: 1 mg/dL (ref 0.0–1.2)
Total Protein: 5.2 g/dL — ABNORMAL LOW (ref 6.5–8.1)

## 2023-12-24 LAB — CBC
HCT: 35.6 % — ABNORMAL LOW (ref 36.0–46.0)
Hemoglobin: 12.5 g/dL (ref 12.0–15.0)
MCH: 31 pg (ref 26.0–34.0)
MCHC: 35.1 g/dL (ref 30.0–36.0)
MCV: 88.3 fL (ref 80.0–100.0)
Platelets: 148 10*3/uL — ABNORMAL LOW (ref 150–400)
RBC: 4.03 MIL/uL (ref 3.87–5.11)
RDW: 14.4 % (ref 11.5–15.5)
WBC: 12.2 10*3/uL — ABNORMAL HIGH (ref 4.0–10.5)
nRBC: 0 % (ref 0.0–0.2)

## 2023-12-24 MED ORDER — KETOROLAC TROMETHAMINE 15 MG/ML IJ SOLN
15.0000 mg | Freq: Three times a day (TID) | INTRAMUSCULAR | Status: DC
  Administered 2023-12-24 (×2): 15 mg via INTRAVENOUS
  Filled 2023-12-24 (×3): qty 1

## 2023-12-24 MED ORDER — BOOST / RESOURCE BREEZE PO LIQD CUSTOM
1.0000 | Freq: Two times a day (BID) | ORAL | Status: DC
  Administered 2023-12-26: 1 via ORAL

## 2023-12-24 NOTE — Plan of Care (Signed)
  Problem: Education: Goal: Knowledge of General Education information will improve Description: Including pain rating scale, medication(s)/side effects and non-pharmacologic comfort measures Outcome: Progressing   Problem: Clinical Measurements: Goal: Ability to maintain clinical measurements within normal limits will improve Outcome: Progressing   Problem: Nutrition: Goal: Adequate nutrition will be maintained Outcome: Progressing   Problem: Elimination: Goal: Will not experience complications related to bowel motility Outcome: Progressing   Problem: Pain Managment: Goal: General experience of comfort will improve and/or be controlled Outcome: Progressing   Problem: Safety: Goal: Ability to remain free from injury will improve Outcome: Progressing   Problem: Skin Integrity: Goal: Risk for impaired skin integrity will decrease Outcome: Progressing

## 2023-12-24 NOTE — Progress Notes (Signed)
PROGRESS NOTE   [name]  WUJ:[id number]    DOB: 1936-03-29    DOA: 12/20/2023  PCP: Georgann Housekeeper, MD   I have briefly reviewed patients previous medical records in [facility].  Chief Complaint  Patient presents with   Fall   Hip Pain    Brief Hospital Course:  88 year old female with medical history significant for paroxysmal A-fib, chronic diastolic CHF, DVT on Coumadin, hypertension, hypothyroidism, presented to the ED following a mechanical fall at home.  She was attempting to place something into the refrigerator, lost her grip and fell onto her left side with prominent pain.  Admitted for close left hip fracture and hematoma of right lower leg.  Orthopedic trauma and trauma surgery consulted.  She underwent percutaneous fixation of left femoral neck fracture by Dr. Everardo Pacific on 2/8.  Neurosurgery was consulted for T10 compression fracture, managing conservatively with LSO brace.   Assessment & Plan:  Principal Problem:   Closed hip fracture, left, initial encounter (HCC) Active Problems:   Leukocytosis   Hypokalemia   Traumatic hematoma of lower leg, right, initial encounter   History of blood clots   Essential hypertension   Hypothyroidism   PAF (paroxysmal atrial fibrillation) (HCC)   Chronic diastolic CHF (congestive heart failure) (HCC)   DNR (do not resuscitate)/DNI(Do Not intubate)   Protein-calorie malnutrition, severe   Closed left hip fracture/left impacted femoral neck fracture Orthopedics consulted and s/p percutaneous fixation by Dr. Everardo Pacific on 2/8. Management per orthopedics: WBAT LLE, continue Eliquis 2.5 Mg twice daily which would serve DVT prophylaxis as well, multimodality pain control as allowed by patient (patient insists on taking only her home Norco, states that the IV morphine does not help but unwilling to increase the dose or change to alternate IV opioids/Dilaudid or fentanyl or try IV Toradol), outpatient follow-up in office in 14 to 21  days for incision check, x-ray and suture removal. She appears to have intolerance to Robaxin and Zanaflex and hence did not start. Patient today agreeable to trial of IV Toradol. Therapies evaluated and recommend SNF, TOC consulted, can hopefully DC in the next 24 to 48 hours pending better pain control and mobilization.  Acute on chronic T10 compression fracture Patient complaining of low mid back pain. Consulted neurosurgery and their input appreciated.  They indicate that she has history of numerous compression fractures, some of which are s/p kyphoplasty.  Initially they had recommended TLSO but given her kyphosis, that is quite uncomfortable and therefore they now recommend LSO portion as needed. No focal neurological deficits.  Traumatic right lower leg hematoma Monitor.  Stable.  Postop anemia and thrombocytopenia Hemoglobin normal.  Thrombocytopenia mild and steadily improving.  Mechanical fall CT C/A/P: No acute traumatic injury.  Hypokalemia Replaced   Leukocytosis Likely due to demargination from stress of fracture. No evidence of any infection. Continues to improve spontaneously without any specific intervention.   Chronic diastolic CHF (congestive heart failure) (HCC) Compensated.  Continue Lasix and Aldactone.   PAF (paroxysmal atrial fibrillation) (HCC) Continue home dose of diltiazem CD120 mg daily and amiodarone 200 Mg daily. Eliquis was held preop and started back on postop day 1.   Hypothyroidism Continue synthroid 125 mcg daily.   Essential hypertension Continue Cardizem.  Controlled.   History of blood clots Continue Eliquis 2.5 Mg twice daily.  Mild transaminitis Unclear etiology.  CK normal. Resolved.  Stable aneurysmal ascending thoracic aorta (4.6 cm) Noted on CT C/A/P 2/7 Recommend semiannual imaging follow-up by CTA  or MRA and referral to cardiothoracic surgery if not already done.  Outpatient follow-up.  Constipation Noted on CT  A/P Aggressive bowel regimen.  LBM 2/10  Body mass index is 18.77 kg/m.  Nutritional Status Nutrition Problem: Severe Malnutrition Etiology: chronic illness Signs/Symptoms: severe muscle depletion, severe fat depletion Interventions: Refer to RD note for recommendations Dietician input appreciated, agree with recommendations.  DVT prophylaxis: apixaban (ELIQUIS) tablet 2.5 mg Start: 12/22/23 1000 SCDs Start: 12/21/23 1224 SCDs Start: 12/21/23 0304     Code Status: Limited: Do not attempt resuscitation (DNR) -DNR-LIMITED -Do Not Intubate/DNI :  Family Communication: None at bedside Disposition:  Postop pain control, mobilization prior to DC to SNF.     Consultants:   Orthopedics Trauma surgery  Procedures:   As noted above  Antimicrobials:      Subjective:  Had eaten only a couple of bites of her scrambled egg and stated that she was full.  Indicated that her pain was not controlled and was willing to try a short duration of IV Toradol.  No chest pain, dyspnea or any other complaints.  Objective:   Vitals:   12/23/23 1558 12/23/23 1955 12/24/23 0536 12/24/23 0755  BP: 105/85 115/79 100/70 107/79  Pulse: 83 84 77 82  Resp: 16 16 18 16   Temp: 98.2 F (36.8 C) 97.9 F (36.6 C) 98.2 F (36.8 C) (!) 97.4 F (36.3 C)  TempSrc: Oral Oral Oral Oral  SpO2: 96% 91% 91% (!) 88%  Weight:      Height:        General exam: Elderly female, moderately built, thinly nourished lying propped up in bed, sitting up in bed with breakfast tray in front of her which she wanted me to move aside. Respiratory system: Clear to auscultation.  No increased work of breathing. Cardiovascular system: S1 & S2 heard, RRR.  No JVD or pedal edema.  2/6 SEM best heard at apex.  No pedal edema.  Stable without change. Gastrointestinal system: Abdomen is nondistended, soft and nontender. No organomegaly or masses felt. Normal bowel sounds heard. Central nervous system: Alert and oriented.  No focal  neurological deficits. Extremities: Small area of hematoma over right lower anterior shin with mild tenderness.  Remains stable.  Compartments soft.  Details as in picture below from admission. Left hip postop dressing site clean and dry.  Stable. Bilateral lower extremity at least grade 3 x 5 power, full power assessment limited secondary to pain. Skin: No rashes, lesions or ulcers.   Psychiatry: Judgement and insight appear somewhat impaired. Mood & affect better today.       Data Reviewed:   I have personally reviewed following labs and imaging studies   CBC: Recent Labs  Lab 12/20/23 2220 12/21/23 0705 12/21/23 1332 12/22/23 0524 12/23/23 0624 12/24/23 0558  WBC 20.0* 19.9*   < > 15.5* 12.5* 12.2*  NEUTROABS 17.2* 17.8*  --   --   --   --   HGB 13.3 13.1   < > 11.3* 12.4 12.5  HCT 37.9 36.7   < > 32.8* 36.0 35.6*  MCV 89.4 88.4   < > 87.7 89.8 88.3  PLT 170 170   < > 124* 136* 148*   < > = values in this interval not displayed.    Basic Metabolic Panel: Recent Labs  Lab 12/20/23 2220 12/21/23 0705 12/22/23 0524 12/23/23 0624 12/24/23 0558  NA 138 136 133* 131* 132*  K 3.3* 4.1 4.4 4.7 4.6  CL 98 101 100  98 98  CO2 25 22 24 27 27   GLUCOSE 141* 122* 105* 101* 116*  BUN 26* 26* 29* 25* 21  CREATININE 0.97 0.90 0.88 0.98 0.88  CALCIUM 9.2 8.4* 8.3* 8.5* 8.4*    Liver Function Tests: Recent Labs  Lab 12/20/23 2220 12/21/23 0705 12/22/23 0524 12/23/23 0624 12/24/23 0558  AST 45* 49* 66* 53* 36  ALT 56* 60* 49* 28 22  ALKPHOS 47 46 36* 39 40  BILITOT 0.9 0.9 0.5 0.8 1.0  PROT 6.1* 6.0* 5.2* 5.7* 5.2*  ALBUMIN 3.4* 3.3* 2.8* 2.9* 2.7*    CBG: No results for input(s): "GLUCAP" in the last 168 hours.  Microbiology Studies:   Recent Results (from the past 240 hours)  Surgical PCR screen     Status: None   Collection Time: 12/21/23  3:22 AM   Specimen: Nasal Mucosa; Nasal Swab  Result Value Ref Range Status   MRSA, PCR NEGATIVE NEGATIVE Final    Staphylococcus aureus NEGATIVE NEGATIVE Final    Comment: (NOTE) The Xpert SA Assay (FDA approved for NASAL specimens in patients 28 years of age and older), is one component of a comprehensive surveillance program. It is not intended to diagnose infection nor to guide or monitor treatment. Performed at [facility], [address], [place], [place] [address]     Radiology Studies:  No results found.   Scheduled Meds:    amiodarone  200 mg Oral Daily   apixaban  2.5 mg Oral BID   diltiazem  120 mg Oral Daily   docusate sodium  100 mg Oral BID   feeding supplement  1 Container Oral BID BM   fesoterodine  4 mg Oral Daily   furosemide  20 mg Oral Daily   iron polysaccharides  150 mg Oral Once per day on Monday Wednesday Friday   ketorolac  15 mg Intravenous Q8H   levothyroxine  125 mcg Oral QAC breakfast   multivitamin with minerals  1 tablet Oral Daily   mupirocin ointment  1 Application Nasal BID   pantoprazole  40 mg Oral Daily   polyethylene glycol  17 g Oral BID   pravastatin  20 mg Oral Q supper   spironolactone  12.5 mg Oral Daily    Continuous Infusions:       LOS: 3 days     Marcellus Scott, MD,  FACP, [facility], [facility], [facility]   [facility] Hospitalist & Physician Advisor [place]      To contact the attending provider between 7A-7P or the covering provider during after hours 7P-7A, please log into the web site [email or web address] and access using universal [place] password for that web site. If you do not have the password, please call the hospital operator.  12/24/2023, 4:35 PM

## 2023-12-24 NOTE — Progress Notes (Addendum)
Nutrition Follow-up  DOCUMENTATION CODES:   Severe malnutrition in context of chronic illness  INTERVENTION:   Liberalize diet to promote PO intake  Boost Breeze po BID, each supplement provides 250 kcal and 9 grams of protein Magic cup TID with meals, each supplement provides 290 kcal and 9 grams of protein cup Multivitamin with minerals daily Discontinue Ensure Enlive due to patients preference  NUTRITION DIAGNOSIS:   Severe Malnutrition related to chronic illness as evidenced by severe muscle depletion, severe fat depletion.  - New diagnosis   GOAL:   Patient will meet greater than or equal to 90% of their needs  - Ongoing   MONITOR:   PO intake, Weight trends, Supplement acceptance, Labs, I & O's  REASON FOR ASSESSMENT:   Consult Hip fracture protocol  ASSESSMENT:   88 y.o. female with history of paroxysmal A-fib, chronic diastolic heart failure, history of DVT on Coumadin, hypertension, hypothyroidism, presents to the ER today after mechanical fall at home. Admitted for acute left femoral neck fracture.  2/8 - PERCUTANEOUS FIXATION OF FEMORAL NECK    Patient laying bed endorses pain in her left side. She lives at home and cooks her own meals, does have a caregiver with her. She reports she has been eating 2 meals and drinking 1 premier protein shake a day PTA. Does snack throughout the day. However she states she cannot eat a lot at one time because she gets full.   Since being admitted she reports her intake has decreased due to the heart healthy diet she is on. Will liberalize. Had 25% of breakfast this morning. She has tried Ensures here and does not like them, prefers [employer]. Patient overwhelmed when talking about ways to increase PO intake. Wanted to try [place] yogurt as a snack.   Per weight records, pt has lost 13 lbs since 03/08/23 (12% wt loss x 9.5 months, insignificant for time frame).   Admit weight: 42.2 kg  Current weight: 42.2 kg    Average  Meal Intake: 2/10-2/10: 25% intake x 2 recorded meals  Nutritionally Relevant Medications: Scheduled Meds:  docusate sodium  100 mg Oral BID   iron polysaccharides  150 mg Oral Once per day on Monday Wednesday Friday   levothyroxine  125 mcg Oral QAC breakfast   multivitamin with minerals  1 tablet Oral Daily   mupirocin ointment  1 Application Nasal BID   pantoprazole  40 mg Oral Daily   polyethylene glycol  17 g Oral BID   Labs Reviewed: Sodium 132, Calcium 8.4,  CBG ranges from 101-116 mg/dL over the last 24 hours   NUTRITION - FOCUSED PHYSICAL EXAM:  Flowsheet Row Most Recent Value  Orbital Region Severe depletion  Upper Arm Region Severe depletion  Thoracic and Lumbar Region Severe depletion  Buccal Region Severe depletion  Temple Region Severe depletion  Clavicle Bone Region Severe depletion  Clavicle and Acromion Bone Region Severe depletion  Scapular Bone Region Severe depletion  Dorsal Hand Severe depletion  Patellar Region Severe depletion  Anterior Thigh Region Severe depletion  Posterior Calf Region Severe depletion  Edema (RD Assessment) None  Hair Reviewed  Eyes Reviewed  Mouth Reviewed  Skin Reviewed  Nails Reviewed       Diet Order:   Diet Order             Diet regular Room service appropriate? Yes; Fluid consistency: Thin  Diet effective now  EDUCATION NEEDS:   Education needs have been addressed  Skin:  Skin Assessment: Skin Integrity Issues: Skin Integrity Issues:: Incisions Incisions: Left hip Other: hematoma of lower right leg  Last BM:  12/23/2023  Height:   Ht Readings from Last 1 Encounters:  12/21/23 4' 11.02" (1.499 m)    Weight:   Wt Readings from Last 1 Encounters:  12/21/23 42.2 kg    BMI:  Body mass index is 18.77 kg/m.  Estimated Nutritional Needs:   Kcal:  1300-1400 kcal  Protein:  65-85 gm  Fluid:  >1.3L/day   Elliot Dally, RD Registered Dietitian  See Amion for more  information

## 2023-12-24 NOTE — TOC Initial Note (Signed)
Transition of Care [facility]) - Initial/Assessment Note    Patient Details  Name: [name] MRN: [id number] Date of Birth: 1936-02-12  Transition of Care [facility]) CM/SW Contact:    Carley Hammed, LCSW Phone Number: 12/24/2023, 1:17 PM  Clinical Narrative:                 CSW met with pt at bedside to discuss recommendation for SNF. Pt notes that she has been to [facility] before, but does not feel like she is ready to DC yet. Pt states she had an ok experience at [facility] and notes it is close to home. Pt agreeable for CSW to start the process and to update her sister. CSW spoke with Eber Jones who is also agreeable to plan. CSW completed workup and requested [facility] review pt for possible admission. Insurance Berkley Harvey will need to be started when bed is confirmed. TOC will continue to follow.   Expected Discharge Plan: Skilled Nursing Facility Barriers to Discharge: English as a second language teacher, Continued Medical Work up, SNF Pending bed offer   Patient Goals and CMS Choice Patient states their goals for this hospitalization and ongoing recovery are:: Pt wants to be able to regain independence. CMS Medicare.gov Compare Post Acute Care list provided to:: Patient Choice offered to / list presented to : Patient      Expected Discharge Plan and Services In-house Referral: Clinical Social Work   Post Acute Care Choice: Skilled Nursing Facility Living arrangements for the past 2 months: Single Family Home                                      Prior Living Arrangements/Services Living arrangements for the past 2 months: Single Family Home Lives with:: Self Patient language and need for interpreter reviewed:: Yes Do you feel safe going back to the place where you live?: Yes      Need for Family Participation in Patient Care: No (Comment) Care giver support system in place?: No (comment)   Criminal Activity/Legal Involvement Pertinent to Current Situation/Hospitalization: No - Comment as  needed  Activities of Daily Living   ADL Screening (condition at time of admission) Independently performs ADLs?: Yes (appropriate for developmental age) Is the patient deaf or have difficulty hearing?: No Does the patient have difficulty seeing, even when wearing glasses/contacts?: No Does the patient have difficulty concentrating, remembering, or making decisions?: No  Permission Sought/Granted Permission sought to share information with : Family Supports Permission granted to share information with : Yes, Verbal Permission Granted  Share Information with NAME: Eber Jones     Permission granted to share info w Relationship: Sister     Emotional Assessment Appearance:: Appears stated age Attitude/Demeanor/Rapport: Engaged Affect (typically observed): Appropriate Orientation: : Oriented to Self, Oriented to Place, Oriented to  Time, Oriented to Situation Alcohol / Substance Use: Not Applicable Psych Involvement: No (comment)  Admission diagnosis:  Closed fracture of left hip, initial encounter (HCC) [S72.002A] Closed hip fracture, left, initial encounter [facility]) [S72.002A] Patient Active Problem List   Diagnosis Date Noted   Closed hip fracture, left, initial encounter (HCC) 12/21/2023   Traumatic hematoma of lower leg, right, initial encounter 12/21/2023   DNR (do not resuscitate)/DNI(Do Not intubate) 12/21/2023   Blood clotting disorder (HCC) 01/16/2023   Nonrheumatic tricuspid valve regurgitation 11/29/2022   Hiatal hernia 11/22/2022   CHF (congestive heart failure) (HCC) 11/22/2022   Trigger point of right  shoulder region 10/23/2022   Aortic atherosclerosis (HCC) 05/04/2020   Lumbar compression fracture (HCC) 06/21/2017   Ascending aortic aneurysm (HCC) 03/27/2017   PAF (paroxysmal atrial fibrillation) (HCC) 12/21/2016   Chronic diastolic CHF (congestive heart failure) (HCC) 12/21/2016   Severe mitral regurgitation    Malnutrition of moderate degree 12/01/2016    Hypokalemia    Leukocytosis    Herniated cervical intervertebral disc 09/09/2015   Cervical radiculopathy    Neoplasm of right breast, primary tumor staging category Tis: ductal carcinoma in situ (DCIS) 09/03/2013   Dyspnea 01/21/2013   Bilateral leg edema 01/21/2013   History of DVT of lower extremity 01/21/2013   Back pain, lumbosacral 01/21/2013   Osteoporosis 01/21/2013   Essential hypertension 01/21/2013   Hypothyroidism 01/21/2013   History of blood clots    Cataract    PCP:  Georgann Housekeeper, MD Pharmacy:   [facility] [place], [place] - [address] [facility] [address] [place] [address] Phone: [phone] Fax: [phone]  [facility] [address] [place] [address] Phone: [phone] Fax: [phone]     Social Drivers of Health (SDOH) Social History: SDOH Screenings   Food Insecurity: No Food Insecurity (12/21/2023)  Housing: Unknown (12/21/2023)  Transportation Needs: No Transportation Needs (12/21/2023)  Utilities: Not At Risk (12/21/2023)  Social Connections: Unknown (12/21/2023)  Tobacco Use: Low Risk  (12/21/2023)   SDOH Interventions:     Readmission Risk Interventions     No data to display

## 2023-12-24 NOTE — Progress Notes (Signed)
   ORTHOPAEDIC PROGRESS NOTE  s/p Procedure(s): PERCUTANEOUS FIXATION OF FEMORAL NECK on 2/8 with Dr. Everardo Pacific  SUBJECTIVE: She feels better this morning than she did yesterday. Resting comfortably in bed.   OBJECTIVE: PE: LLE: dressing CDI, leg lengths equal, warm well perfused foot, intact EHL/TA/GSC   Vitals:   12/23/23 1955 12/24/23 0536  BP: 115/79 100/70  Pulse: 84 77  Resp: 16 18  Temp: 97.9 F (36.6 C) 98.2 F (36.8 C)  SpO2: 91% 91%   Stable post-op images.   ASSESSMENT: [name] is a 88 y.o. female POD#3  PLAN: Weightbearing: WBAT LLE Insicional and dressing care: Reinforce dressings as needed Orthopedic device(s): None Showering: hold for now VTE prophylaxis: Resume Eliquis Pain control: PRN pain medications, minimize narcotics as able Dispo: Anticipate SNF. Discharge medication printed and placed in patient's chart. Okay to discharge from orthopedic standpoint once cleared by medicine and therapies.  Follow - up plan: 2 weeks in office Contact information:  Weekdays 8-5 Dr. Ramond Marrow, Alfonse Alpers PA-C, After hours and holidays please check [email or web address] for group call information for [facility]   Alfonse Alpers PA-C 12/24/2023

## 2023-12-24 NOTE — NC FL2 (Signed)
[place] MEDICAID FL2 LEVEL OF CARE FORM     IDENTIFICATION  Patient Name: [name] Birthdate: 08-Feb-1936 Sex: female Admission Date (Current Location): 12/20/2023  [facility] [place] Number:  Producer, television/film/video and Address:  [facility], [address] [place], [place] [address]      Provider Number: [id number]  Attending Physician Name and Address:  Elease Etienne, MD  Relative Name and Phone Number:       Current Level of Care: Hospital Recommended Level of Care: Skilled Nursing Facility Prior Approval Number:    Date Approved/Denied:   PASRR Number: [id number]  Discharge Plan: SNF    Current Diagnoses: Patient Active Problem List   Diagnosis Date Noted   Closed hip fracture, left, initial encounter (HCC) 12/21/2023   Traumatic hematoma of lower leg, right, initial encounter 12/21/2023   DNR (do not resuscitate)/DNI(Do Not intubate) 12/21/2023   Blood clotting disorder (HCC) 01/16/2023   Nonrheumatic tricuspid valve regurgitation 11/29/2022   Hiatal hernia 11/22/2022   CHF (congestive heart failure) (HCC) 11/22/2022   Trigger point of right shoulder region 10/23/2022   Aortic atherosclerosis (HCC) 05/04/2020   Lumbar compression fracture (HCC) 06/21/2017   Ascending aortic aneurysm (HCC) 03/27/2017   PAF (paroxysmal atrial fibrillation) (HCC) 12/21/2016   Chronic diastolic CHF (congestive heart failure) (HCC) 12/21/2016   Severe mitral regurgitation    Malnutrition of moderate degree 12/01/2016   Hypokalemia    Leukocytosis    Herniated cervical intervertebral disc 09/09/2015   Cervical radiculopathy    Neoplasm of right breast, primary tumor staging category Tis: ductal carcinoma in situ (DCIS) 09/03/2013   Dyspnea 01/21/2013   Bilateral leg edema 01/21/2013   History of DVT of lower extremity 01/21/2013   Back pain, lumbosacral 01/21/2013   Osteoporosis 01/21/2013   Essential hypertension 01/21/2013    Hypothyroidism 01/21/2013   History of blood clots    Cataract     Orientation RESPIRATION BLADDER Height & Weight     Self, Time, Situation, Place  Normal Continent Weight: 93 lb (42.2 kg) Height:  4' 11.02" (149.9 cm)  BEHAVIORAL SYMPTOMS/MOOD NEUROLOGICAL BOWEL NUTRITION STATUS      Continent Diet (See DC summary)  AMBULATORY STATUS COMMUNICATION OF NEEDS Skin   Extensive Assist Verbally Surgical wounds (L hip incision)                       Personal Care Assistance Level of Assistance  Bathing, Feeding, Dressing Bathing Assistance: Maximum assistance Feeding assistance: Limited assistance Dressing Assistance: Maximum assistance     Functional Limitations Info  Sight, Hearing, Speech Sight Info: Impaired Hearing Info: Adequate Speech Info: Adequate    SPECIAL CARE FACTORS FREQUENCY  PT (By licensed PT), OT (By licensed OT)     PT Frequency: 5x week OT Frequency: 5x week            Contractures Contractures Info: Not present    Additional Factors Info  Code Status, Allergies Code Status Info: DNR Allergies Info: Contrast Media (Iodinated Contrast Media)  Iohexol  Mucinex (Guaifenesin Er)  Zanaflex (Tizanidine Hcl)  Penicillins  Robaxin (Methocarbamol)           Current Medications (12/24/2023):  This is the current hospital active medication list Current Facility-Administered Medications  Medication Dose Route Frequency Provider Last Rate Last Admin   acetaminophen (TYLENOL) tablet 650 mg  650 mg Oral Q6H PRN Hongalgi, Maximino Greenland, MD  amiodarone (PACERONE) tablet 200 mg  200 mg Oral Daily Corinna Capra, PA-C   200 mg at 12/24/23 0805   apixaban (ELIQUIS) tablet 2.5 mg  2.5 mg Oral BID Elease Etienne, MD   2.5 mg at 12/24/23 0805   bisacodyl (DULCOLAX) EC tablet 5 mg  5 mg Oral Daily PRN Elease Etienne, MD       diltiazem (CARDIZEM CD) 24 hr capsule 120 mg  120 mg Oral Daily Hongalgi, Maximino Greenland, MD       docusate sodium (COLACE) capsule 100 mg   100 mg Oral BID [place] Nickel A, PA-C   100 mg at 12/24/23 0805   feeding supplement (BOOST / RESOURCE BREEZE) liquid 1 Container  1 Container Oral BID BM Hongalgi, Maximino Greenland, MD       fesoterodine (TOVIAZ) tablet 4 mg  4 mg Oral Daily Elease Etienne, MD   4 mg at 12/22/23 2149   furosemide (LASIX) tablet 20 mg  20 mg Oral Daily Elease Etienne, MD   20 mg at 12/24/23 0805   HYDROcodone-acetaminophen (NORCO/VICODIN) 5-325 MG per tablet 1-2 tablet  1-2 tablet Oral Q6H PRN Elease Etienne, MD   2 tablet at 12/24/23 1049   iron polysaccharides (NIFEREX) capsule 150 mg  150 mg Oral Once per day on Monday Wednesday Friday Marcellus Scott D, MD   150 mg at 12/23/23 1610   ketorolac (TORADOL) 15 MG/ML injection 15 mg  15 mg Intravenous Q8H Hongalgi, Maximino Greenland, MD   15 mg at 12/24/23 1305   levothyroxine (SYNTHROID) tablet 125 mcg  125 mcg Oral QAC breakfast Corinna Capra, PA-C   125 mcg at 12/24/23 0710   menthol-cetylpyridinium (CEPACOL) lozenge 3 mg  1 lozenge Oral PRN Corinna Capra, PA-C       Or   phenol (CHLORASEPTIC) mouth spray 1 spray  1 spray Mouth/Throat PRN Corinna Capra, PA-C       morphine (PF) 2 MG/ML injection 0.5-1 mg  0.5-1 mg Intravenous Q2H PRN Corinna Capra, PA-C   1 mg at 12/23/23 0413   multivitamin with minerals tablet 1 tablet  1 tablet Oral Daily Corinna Capra, PA-C   1 tablet at 12/22/23 1046   mupirocin ointment (BACTROBAN) 2 % 1 Application  1 Application Nasal BID Corinna Capra, PA-C   1 Application at 12/22/23 2147   ondansetron (ZOFRAN) injection 4 mg  4 mg Intravenous Q6H PRN Corinna Capra, PA-C   4 mg at 12/23/23 0413   pantoprazole (PROTONIX) EC tablet 40 mg  40 mg Oral Daily [place] Nickel A, PA-C   40 mg at 12/24/23 0805   polyethylene glycol (MIRALAX / GLYCOLAX) packet 17 g  17 g Oral Daily PRN [place] Nickel A, PA-C       polyethylene glycol (MIRALAX / GLYCOLAX) packet 17 g  17 g Oral BID Marcellus Scott D, MD   17 g at 12/24/23 0805   pravastatin  (PRAVACHOL) tablet 20 mg  20 mg Oral Q supper Marcellus Scott D, MD   20 mg at 12/23/23 1719   spironolactone (ALDACTONE) tablet 12.5 mg  12.5 mg Oral Daily Elease Etienne, MD   12.5 mg at 12/24/23 0805     Discharge Medications: Please see discharge summary for a list of discharge medications.  Relevant Imaging Results:  Relevant Lab Results:   Additional Information SS# [id number] [place]

## 2023-12-25 DIAGNOSIS — S72002A Fracture of unspecified part of neck of left femur, initial encounter for closed fracture: Secondary | ICD-10-CM | POA: Diagnosis not present

## 2023-12-25 LAB — BASIC METABOLIC PANEL
Anion gap: 12 (ref 5–15)
BUN: 26 mg/dL — ABNORMAL HIGH (ref 8–23)
CO2: 25 mmol/L (ref 22–32)
Calcium: 8.6 mg/dL — ABNORMAL LOW (ref 8.9–10.3)
Chloride: 92 mmol/L — ABNORMAL LOW (ref 98–111)
Creatinine, Ser: 0.81 mg/dL (ref 0.44–1.00)
GFR, Estimated: >60 mL/min (ref >60)
Glucose, Bld: 104 mg/dL — ABNORMAL HIGH (ref 70–99)
Potassium: 4.3 mmol/L (ref 3.5–5.1)
Sodium: 129 mmol/L — ABNORMAL LOW (ref 135–145)

## 2023-12-25 LAB — CBC
HCT: 34.9 % — ABNORMAL LOW (ref 36.0–46.0)
Hemoglobin: 12.3 g/dL (ref 12.0–15.0)
MCH: 30.8 pg (ref 26.0–34.0)
MCHC: 35.2 g/dL (ref 30.0–36.0)
MCV: 87.3 fL (ref 80.0–100.0)
Platelets: 164 10*3/uL (ref 150–400)
RBC: 4 MIL/uL (ref 3.87–5.11)
RDW: 13.9 % (ref 11.5–15.5)
WBC: 11.2 10*3/uL — ABNORMAL HIGH (ref 4.0–10.5)
nRBC: 0 % (ref 0.0–0.2)

## 2023-12-25 MED ORDER — HYDROCODONE-ACETAMINOPHEN 5-325 MG PO TABS
1.0000 | ORAL_TABLET | ORAL | Status: DC | PRN
  Administered 2023-12-25 (×2): 2 via ORAL
  Administered 2023-12-26: 1 via ORAL
  Filled 2023-12-25 (×3): qty 2

## 2023-12-25 NOTE — TOC Progression Note (Signed)
Transition of Care [facility]) - Progression Note    Patient Details  Name: [name] MRN: [id number] Date of Birth: 1936-05-23  [facility]) CM/SW Contact  Carley Hammed, LCSW Phone Number: 12/25/2023, 2:55 PM  Clinical Narrative:    CSW spoke with pt this am and she stated she was still having pain and not ready to DC. MD spoke with pt and agreed to tomorrow for DC. Authorization and [facility] approved. CSW spoke with sister to update and all questions were answered. TOC will continue to follow for DC needs.    Expected Discharge Plan: Skilled Nursing Facility Barriers to Discharge: English as a second language teacher, Continued Medical Work up, SNF Pending bed offer  Expected Discharge Plan and Services In-house Referral: Clinical Social Work   Post Acute Care Choice: Skilled Nursing Facility Living arrangements for the past 2 months: Single Family Home                                       Social Determinants of Health (SDOH) Interventions SDOH Screenings   Food Insecurity: No Food Insecurity (12/21/2023)  Housing: Low Risk  (12/24/2023)  Transportation Needs: No Transportation Needs (12/21/2023)  Utilities: Not At Risk (12/21/2023)  Social Connections: Unknown (12/24/2023)  Tobacco Use: Low Risk  (12/21/2023)    Readmission Risk Interventions     No data to display

## 2023-12-25 NOTE — Progress Notes (Signed)
Physical Therapy Treatment Patient Details Name: [name] MRN: [id number] DOB: [birth year] Today's Date: 12/25/2023   History of Present Illness Pt is an 88 y/o female who presents 12/20/2023 s/p fall at home, sustaining a femoral neck fracture and T10 compression fracture. Pt is now s/p fixation of femoral neck fx on 12/21/2023. Neurosurgery consulted for compression fx and recommended TLSO. On 12/23/23 neurosurgery PA, Sarah assessed brace fit and recommends LSO portion of brace only. PMH significant for Aortic regurgitation, breast CA s/p partial mastectomy 2012, CHF, L4 compression fracture s/p kyphoplasty 2018, kyphoplasty 2014, DVT, HTN, hypothyroidism, mitral valve prolapse, mitral regurgitation, tricuspid regurgitation, thoracic AAA, pulmonary HTN, a-fib, osteoporosis, ACDF 2016.    PT Comments  Pt premedicated prior to session, but continues to be limited in safe mobility by pain in L hip and back. Pt with decreased ability to move L LE despite attempting gentle ROM. Attempted to perform rolling and sidelying to sitting but pt reports pain rolling both to the R and the L. Ultimately, performed helicopter transfer with bed pad and HoB elevated. Once EoB attempted to place LSO portion of TLSO but pt does not tolerate. Mobilized without brace. Pt is maxAx2 for coming to standing. With increased cuing pt able to take lateral steps to the HoB. Helicopter transfer required to get pt back in bed. D/c plan remains appropriate. PT will continue to follow acutely.     If plan is discharge home, recommend the following: Two people to help with walking and/or transfers;Two people to help with bathing/dressing/bathroom;Assistance with cooking/housework;Assist for transportation;Help with stairs or ramp for entrance   Can travel by private vehicle     No  Equipment Recommendations  Rolling walker (2 wheels)       Precautions / Restrictions Precautions Precautions: Fall;Back Precaution Booklet  Issued: No Precaution/Restrictions Comments: Verbally reviewed precautions during functional mobility. Required Braces or Orthoses: Spinal Brace Spinal Brace: Lumbar corset;Applied in sitting position;Other (comment) Spinal Brace Comments: Order for TLSO; neurosurgery PA in room at end of session and  converted brace to LSO; PA reports if pt cannot tolerate brace secondary to kyphosis/scoliosis, pt can mobilize without the brace. Restrictions Weight Bearing Restrictions Per Provider Order: Yes Other Position/Activity Restrictions: WBAT LLE     Mobility  Bed Mobility Overal bed mobility: Needs Assistance Bed Mobility: Supine to Sit, Sit to Supine, Rolling Rolling:  (does not tolerate)   Supine to sit: Total assist, +2 for physical assistance Sit to supine: Total assist, +2 for physical assistance   General bed mobility comments: pt screeches with attempts to roll. Utilized bed pad to perform helicopter transfer to and from the EoB    Transfers Overall transfer level: Needs assistance Equipment used: 2 person hand held assist Transfers: Sit to/from Stand, Bed to chair/wheelchair/BSC Sit to Stand: Total assist, +2 physical assistance, +2 safety/equipment          Lateral/Scoot Transfers: Total assist, +2 physical assistance General transfer comment: long discussion about where pt is going to end up. refuses to sit in recliner if she has to sit up for any period of time, eventually pt agrees to take lateral steps towards HoB and then return to bed    Ambulation/Gait               General Gait Details: unable to progress         Balance Overall balance assessment: Needs assistance Sitting-balance support: Bilateral upper extremity supported, Feet supported Sitting balance-Leahy Scale: Poor  Standing balance support: Bilateral upper extremity supported, During functional activity Standing balance-Leahy Scale: Zero Standing balance comment: +2                             Communication Communication Communication: No apparent difficulties  Cognition Arousal: Alert Behavior During Therapy: WFL for tasks assessed/performed                             Following commands: Impaired Following commands impaired: Only follows one step commands consistently (voices objection to every command so only able to follow one command at a time)    Cueing Cueing Techniques: Verbal cues, Gestural cues, Tactile cues     General Comments General comments (skin integrity, edema, etc.): VSS, attempted to place LSO portion of TLSO twice and pt reports she had breast cancer and it is too painful on her chest and that it does not provide any support to her back, ultimately pt did not use bracing for mobility, offered to get lumbar corset but pt reports it won't work, also does not tolerate gait belt so utilized bed pad to provide support      Pertinent Vitals/Pain Pain Assessment Pain Assessment: Faces Faces Pain Scale: Hurts whole lot Pain Location: stomach, back, L hip Pain Descriptors / Indicators: Aching, Sore, Operative site guarding (Pt reports abdominal pain) Pain Intervention(s): Limited activity within patient's tolerance, Monitored during session, Repositioned, Premedicated before session     PT Goals (current goals can now be found in the care plan section) Acute Rehab PT Goals PT Goal Formulation: With patient Time For Goal Achievement: 01/06/24 Potential to Achieve Goals: Fair Progress towards PT goals: Progressing toward goals    Frequency    Min 1X/week       AM-PAC PT "6 Clicks" Mobility   Outcome Measure  Help needed turning from your back to your side while in a flat bed without using bedrails?: Total Help needed moving from lying on your back to sitting on the side of a flat bed without using bedrails?: Total Help needed moving to and from a bed to a chair (including a wheelchair)?: Total Help needed standing up  from a chair using your arms (e.g., wheelchair or bedside chair)?: Total Help needed to walk in hospital room?: Total Help needed climbing 3-5 steps with a railing? : Total 6 Click Score: 6    End of Session   Activity Tolerance: Patient limited by pain Patient left: in bed;with call bell/phone within reach;with bed alarm set Nurse Communication: Mobility status PT Visit Diagnosis: Unsteadiness on feet (R26.81);Pain Pain - Right/Left: Left Pain - part of body: Hip (back)     Time: 0981-1914 PT Time Calculation (min) (ACUTE ONLY): 37 min  Charges:    $Therapeutic Activity: 23-37 mins PT General Charges $$ ACUTE PT VISIT: 1 Visit                     Majed Pellegrin B. Beverely Risen PT, DPT Acute Rehabilitation Services Please use secure chat or  Call Office [phone]    Elon Alas [facility] 12/25/2023, 4:12 PM

## 2023-12-25 NOTE — Discharge Summary (Incomplete)
 DISCHARGE SUMMARY  [name]  MR#: [id number]  DOB:10-15-1936  Date of Admission: 12/20/2023 Date of Discharge: 12/26/2023  Attending Physician:Aleya Durnell Silvestre Gunner, MD  Patient's MVH:[name], Annette Scott, MD  Disposition: Discharge to SNF for rehab stay  Follow-up Appts:  Contact information for follow-up providers     Bjorn Pippin, MD Follow up in 12 day(s).   Specialty: Orthopedic Surgery Contact information: [address] [place] [address] [phone]              Contact information for after-discharge care     Destination     [facility], [place] Preferred SNF .   Service: Skilled Nursing Contact information: [address] [place] [address] [phone]                     Tests Needing Follow-up: -Monitor volume status and determine when Lasix and Aldactone can be resumed -Recheck electrolytes to include potassium and sodium in 2-3 days -Consider semiannual screening of distended thoracic aorta, though would not be unreasonable given advanced age to opt not to do so after counseling patient -discontinue NSAID use after 6 days of tx to avoid gastritis in setting of DOAC use   Discharge Diagnoses: Closed left hip fracture/left impacted femoral neck fracture Acute on chronic T10 compression fracture Traumatic right lower extremity hematoma Postoperative anemia and thrombocytopenia Hypokalemia Mild hyponatremia Chronic diastolic CHF Paroxysmal atrial fibrillation Hypothyroidism HTN History of DVT Stable aneurysmal ascending thoracic aorta at 4.6 cm Severe Malnutrition  Initial presentation: 88 year old with a history of PAF, chronic diastolic CHF, DVT, HTN, and hypothyroidism who suffered a simple mechanical fall at home landing on her left side experiencing immediate left hip pain.  Workup revealed a closed left hip fracture as well as a hematoma of the right lower  extremity.  Orthopedic trauma and trauma surgery were both consulted.  The patient underwent percutaneous fixation of the left femoral neck fracture by Dr. Everardo Pacific 12/21/2023.  A T10 compression fracture was evaluated by Neurosurgery who recommended conservative management with LSO brace.  Hospital Course:  Closed left hip fracture/left impacted femoral neck fracture Status post percutaneous fixation per Dr. Everardo Pacific 12/21/2023 Management per Orthopedics as follows: WBAT LLE, continue Eliquis 2.5 Mg twice daily which would serve DVT prophylaxis as well, multimodality pain control as allowed by patient (patient insists on taking only her home Norco, states that morphine does not help but unwilling to increase the dose or change to alternate opioids/Dilaudid or fentanyl), outpatient follow-up in office in 14 to 21 days for incision check, x-ray and suture removal. She appears to have intolerance to Robaxin and Zanaflex. Therapies evaluated and recommend SNF rehab stay  Acute on chronic T10 compression fracture Neurosurgery evaluated the patient during her admission and indicates she has a history of numerous compression fractures some of which are status post kyphoplasty -initially they recommended TLSO brace but given her kyphosis this proved to be quite uncomfortable and therefore she was transition to an LSO brace -no focal neurologic deficits noted during this hospital stay  Traumatic right lower extremity hematoma exacerbated by use of Eliquis Has been stable during this hospital stay - hemoglobin stable at time of discharge  Postoperative anemia and thrombocytopenia Due to expected perisurgical loss - stable postoperatively with hemoglobin stable at time of discharge  Hypokalemia Corrected with supplementation  Mild hyponatremia  Due to poor intake/volume depletion as well as use of aldactone - encouraged consistent oral intake -  Lasix and Aldactone on hold for 48 hours at time of  discharge  Chronic diastolic CHF Well compensated during this admission - holding usual Lasix and Aldactone for 48 hours in setting of mild volume depletion with hyponatremia  Paroxysmal atrial fibrillation Continue usual home dose of diltiazem and amiodarone -continue Eliquis  Hypothyroidism Continue usual Synthroid dose  HTN Well-controlled  History of DVT Continue Eliquis  Stable aneurysmal ascending thoracic aorta at 4.6 cm Noted on CT chest abdomen pelvis 12/20/2023 - consider if semiannual outpatient imaging via CTa or MRa is indicated given advanced age  Nutrition Problem: Severe Malnutrition Etiology: chronic illness Signs/Symptoms: severe muscle depletion, severe fat depletion Interventions: Refer to RD note for recommendations     Allergies as of 12/26/2023       Reactions   Contrast Media [iodinated Contrast Media] Shortness Of Breath   Iohexol Shortness Of Breath   Mucinex [guaifenesin Er] Other (See Comments)   DIZZINESS   Zanaflex [tizanidine Hcl] Anxiety, Other (See Comments)   Dizziness   Penicillins Rash   Has patient had a PCN reaction causing immediate rash, facial/tongue/throat swelling, SOB or lightheadedness with hypotension: No Has patient had a PCN reaction causing severe rash involving mucus membranes or skin necrosis: No Has patient had a PCN reaction that required hospitalization No Has patient had a PCN reaction occurring within the last 10 years: No If all of the above answers are "NO", then may proceed with Cephalosporin use.   Robaxin [methocarbamol] Diarrhea        Medication List     STOP taking these medications    BONINE PO   OVER THE COUNTER MEDICATION   senna-docusate 8.6-50 MG tablet Commonly known as: Senokot-S       TAKE these medications    acetaminophen 325 MG tablet Commonly known as: TYLENOL Take 2 tablets (650 mg total) by mouth every 6 (six) hours as needed for fever or headache.   amiodarone 200 MG  tablet Commonly known as: PACERONE Take 1 tablet (200 mg total) by mouth daily.   apixaban 2.5 MG Tabs tablet Commonly known as: Eliquis Take 1 tablet (2.5 mg total) by mouth 2 (two) times daily.   bisacodyl 5 MG EC tablet Commonly known as: DULCOLAX Take 1 tablet (5 mg total) by mouth daily as needed for moderate constipation.   calcium citrate-vitamin D 315-200 MG-UNIT tablet Commonly known as: CITRACAL+D Take 1 tablet by mouth 2 (two) times daily.   diltiazem 120 MG 24 hr capsule Commonly known as: CARDIZEM CD Take 1 capsule (120 mg total) by mouth daily.   docusate sodium 100 MG capsule Commonly known as: COLACE Take 1 capsule (100 mg total) by mouth 2 (two) times daily. What changed:  medication strength how much to take when to take this   estradiol 0.1 MG/GM vaginal cream Commonly known as: ESTRACE Place 1 Applicatorful vaginally 2 (two) times a week.   furosemide 20 MG tablet Commonly known as: LASIX Take 1 tablet (20 mg total) by mouth daily. Start taking on: December 29, 2023 What changed: These instructions start on December 29, 2023. If you are unsure what to do until then, ask your doctor or other care provider.   HYDROcodone-acetaminophen 5-325 MG tablet Commonly known as: NORCO/VICODIN Take 1-2 tablets by mouth every 4 (four) hours as needed for moderate pain (pain score 4-6). What changed:  when to take this reasons to take this   ibuprofen 200 MG tablet Commonly known as: ADVIL Take 1-2 tablets (  200-400 mg total) by mouth every 6 (six) hours as needed for up to 6 days for mild pain (pain score 1-3).   iron polysaccharides 150 MG capsule Commonly known as: NIFEREX Take 1 capsule (150 mg total) by mouth 3 (three) times a week. M, W, F   levothyroxine 125 MCG tablet Commonly known as: SYNTHROID Take 1 tablet (125 mcg total) by mouth daily before breakfast.   multivitamin tablet Take 1 tablet by mouth daily with breakfast.   omeprazole 20 MG  capsule Commonly known as: PRILOSEC Take 1 capsule (20 mg total) by mouth daily.   polyethylene glycol 17 g packet Commonly known as: MIRALAX / GLYCOLAX Take 17 g by mouth 2 (two) times daily. What changed: when to take this   pravastatin 20 MG tablet Commonly known as: PRAVACHOL Take 1 tablet (20 mg total) by mouth daily with supper.   risedronate 150 MG tablet Commonly known as: ACTONEL Take 150 mg by mouth every 30 (thirty) days. with water on empty stomach, nothing by mouth or lie down for next 30 minutes.   solifenacin 10 MG tablet Commonly known as: VESICARE Take 1 tablet (10 mg total) by mouth daily. What changed: how much to take   spironolactone 25 MG tablet Commonly known as: ALDACTONE Take 0.5 tablets (12.5 mg total) by mouth daily. Start taking on: December 29, 2023 What changed: These instructions start on December 29, 2023. If you are unsure what to do until then, ask your doctor or other care provider.   VITA-C PO Take 1,000 mg by mouth daily.        Day of Discharge BP 119/81 (BP Location: Left Arm)   Pulse 66   Temp (!) 97.5 F (36.4 C)   Resp 17   Ht 4' 11.02" (1.499 m)   Wt 42.2 kg   SpO2 99%   BMI 18.77 kg/m   Physical Exam: General: No acute respiratory distress Lungs: Clear to auscultation bilaterally without wheezes or crackles Cardiovascular: Regular rate and rhythm without murmur gallop or rub normal S1 and S2 Abdomen: Nontender, nondistended, soft, bowel sounds positive, no rebound, no ascites, no appreciable mass Extremities: No significant cyanosis, clubbing, or edema bilateral lower extremities  Basic Metabolic Panel: Recent Labs  Lab 12/21/23 0705 12/22/23 0524 12/23/23 0624 12/24/23 0558 12/25/23 0644  NA 136 133* 131* 132* 129*  K 4.1 4.4 4.7 4.6 4.3  CL 101 100 98 98 92*  CO2 22 24 27 27 25   GLUCOSE 122* 105* 101* 116* 104*  BUN 26* 29* 25* 21 26*  CREATININE 0.90 0.88 0.98 0.88 0.81  CALCIUM 8.4* 8.3* 8.5* 8.4*  8.6*    CBC: Recent Labs  Lab 12/20/23 2220 12/21/23 0705 12/21/23 1332 12/22/23 0524 12/23/23 0624 12/24/23 0558 12/25/23 0644  WBC 20.0* 19.9* 18.1* 15.5* 12.5* 12.2* 11.2*  NEUTROABS 17.2* 17.8*  --   --   --   --   --   HGB 13.3 13.1 12.4 11.3* 12.4 12.5 12.3  HCT 37.9 36.7 35.7* 32.8* 36.0 35.6* 34.9*  MCV 89.4 88.4 88.6 87.7 89.8 88.3 87.3  PLT 170 170 151 124* 136* 148* 164    Time spent in discharge (includes decision making & examination of pt): 35 minutes  12/26/2023, 11:21 AM   Lonia Blood, MD [facility] Office  [phone]

## 2023-12-25 NOTE — Progress Notes (Addendum)
[name]  BJY:[id number] DOB: May 28, 1936 DOA: 12/20/2023 PCP: Georgann Housekeeper, MD    Brief Narrative:  88 year old with a history of PAF, chronic diastolic CHF, DVT, HTN, and hypothyroidism who suffered a simple mechanical fall at home landing on her left side experiencing immediate left hip pain.  Workup revealed a closed left hip fracture as well as a hematoma of the right lower extremity.   Orthopedic trauma and trauma surgery were both consulted.  The patient underwent percutaneous fixation of the left femoral neck fracture by Dr. Everardo Pacific 12/21/2023.  A T10 compression fracture was evaluated by Neurosurgery who recommended conservative management with LSO brace.  Goals of Care:   Code Status: Limited: Do not attempt resuscitation (DNR) -DNR-LIMITED -Do Not Intubate/DNI    DVT prophylaxis: apixaban (ELIQUIS) tablet 2.5 mg Start: 12/22/23 1000 SCDs Start: 12/21/23 0304 apixaban (ELIQUIS) tablet 2.5 mg   Interim Hx: Afebrile.  Vital signs stable.  No acute events recorded overnight.  The patient appears to be resting comfortably in bed.  On questioning she does report significant pain in her hip and her back which is worsened with attempts to work with therapy.  She is not yet able to ambulate independently or even get out of bed independently.  The patient explains to me that she is not ready for discharge today because she cannot yet walk.  I have corrected her and explained to her that it is not an expectation that she would be able to walk before she is discharged and that this is the very reason she is being discharged to an SNF rehab facility.  I have explained to her ongoing stay within the acute care hospital will only lead to further loss of muscular strength making it harder for her to regain her ambulatory status.  I have explained to her that we will adjust her pain medication today but that she should prepare herself mentally for discharge to SNF rehab 2/13 as this is  unquestionably in her best interest medically and the best way to maximize the likelihood of her regaining the ability to ambulate.  Assessment & Plan:  Closed left hip fracture/left impacted femoral neck fracture Status post percutaneous fixation per Dr. Everardo Pacific 12/21/2023 Management per Orthopedics as follows: WBAT LLE, continue Eliquis 2.5 Mg twice daily which would serve DVT prophylaxis as well, multimodality pain control as allowed by patient (patient insists on taking only her home Norco, states that the IV morphine does not help but unwilling to increase the dose or change to alternate IV opioids/Dilaudid or fentanyl or try IV Toradol), outpatient follow-up in office in 14 to 21 days for incision check, x-ray and suture removal. She appears to have intolerance to Robaxin and Zanaflex. Therapies evaluated and recommend SNF rehab stay   Acute on chronic T10 compression fracture Neurosurgery evaluated the patient during her admission and indicates she has a history of numerous compression fractures some of which are status post kyphoplasty -initially they recommended TLSO brace but given her kyphosis this proved to be quite uncomfortable and therefore she was transition to an LSO brace -no focal neurologic deficits noted during this hospital stay   Traumatic right lower extremity hematoma Has been stable during this hospital stay   Postoperative anemia and thrombocytopenia - anemia of acute blood loss as well as advanced age Due to expected perisurgical loss as well as hematoma - stable postoperatively with hemoglobin 12.3 and platelet count 164 12/25/2023   Hypokalemia Corrected with supplementation  Mild hyponatremia  Due to DH/poor oral intake - encouraged increased intake   Chronic diastolic CHF Well compensated during this admission - continue usual Lasix and Aldactone   Paroxysmal atrial fibrillation Continue usual home dose of diltiazem and amiodarone -continue Eliquis    Hypothyroidism Continue usual Synthroid dose   HTN Well-controlled   History of DVT Continue Eliquis   Stable aneurysmal ascending thoracic aorta at 4.6 cm Noted on CT chest abdomen pelvis 12/20/2023 -consider if semiannual outpatient imaging via CTa or MRa is indicated given advanced age   Severe Malnutrition Etiology: chronic illness Signs/Symptoms: severe muscle depletion, severe fat depletion Interventions: Refer to RD note for recommendations   Family Communication: No family present at time of exam Disposition: Plan for discharge to SNF for rehab stay 12/26/2023   Objective: Blood pressure 114/85, pulse 70, temperature 97.8 F (36.6 C), temperature source Oral, resp. rate 16, height 4' 11.02" (1.499 m), weight 42.2 kg, SpO2 98%.  Intake/Output Summary (Last 24 hours) at 12/25/2023 1628 Last data filed at 12/25/2023 0522 Gross per 24 hour  Intake --  Output 600 ml  Net -600 ml   Filed Weights   12/20/23 1952 12/21/23 0743  Weight: 42.2 kg 42.2 kg    Examination: General: No acute respiratory distress Lungs: Clear to auscultation bilaterally without wheezes or crackles Cardiovascular: Regular rate and rhythm without murmur gallop or rub normal S1 and S2 Abdomen: Nontender, nondistended, soft, bowel sounds positive, no rebound, no ascites, no appreciable mass Extremities: No significant cyanosis, clubbing, or edema bilateral lower extremities  CBC: Recent Labs  Lab 12/20/23 2220 12/21/23 0705 12/21/23 1332 12/23/23 0624 12/24/23 0558 12/25/23 0644  WBC 20.0* 19.9*   < > 12.5* 12.2* 11.2*  NEUTROABS 17.2* 17.8*  --   --   --   --   HGB 13.3 13.1   < > 12.4 12.5 12.3  HCT 37.9 36.7   < > 36.0 35.6* 34.9*  MCV 89.4 88.4   < > 89.8 88.3 87.3  PLT 170 170   < > 136* 148* 164   < > = values in this interval not displayed.   Basic Metabolic Panel: Recent Labs  Lab 12/23/23 0624 12/24/23 0558 12/25/23 0644  NA 131* 132* 129*  K 4.7 4.6 4.3  CL 98 98 92*   CO2 27 27 25   GLUCOSE 101* 116* 104*  BUN 25* 21 26*  CREATININE 0.98 0.88 0.81  CALCIUM 8.5* 8.4* 8.6*   GFR: Estimated Creatinine Clearance: 33.8 mL/min (by C-G formula based on SCr of 0.81 mg/dL).   Scheduled Meds:  amiodarone  200 mg Oral Daily   apixaban  2.5 mg Oral BID   diltiazem  120 mg Oral Daily   docusate sodium  100 mg Oral BID   feeding supplement  1 Container Oral BID BM   fesoterodine  4 mg Oral Daily   iron polysaccharides  150 mg Oral Once per day on Monday Wednesday Friday   levothyroxine  125 mcg Oral QAC breakfast   multivitamin with minerals  1 tablet Oral Daily   mupirocin ointment  1 Application Nasal BID   pantoprazole  40 mg Oral Daily   polyethylene glycol  17 g Oral BID   pravastatin  20 mg Oral Q supper   spironolactone  12.5 mg Oral Daily     LOS: 4 days   Lonia Blood, MD [facility] Office  [phone] Pager - Text Page per Loretha Stapler  If 7PM-7AM, please contact night-coverage per Amion 12/25/2023,  4:28 PM

## 2023-12-26 DIAGNOSIS — S72002A Fracture of unspecified part of neck of left femur, initial encounter for closed fracture: Secondary | ICD-10-CM | POA: Diagnosis not present

## 2023-12-26 MED ORDER — ACETAMINOPHEN 325 MG PO TABS: 650.0000 mg | ORAL_TABLET | Freq: Four times a day (QID) | ORAL | Status: AC | PRN

## 2023-12-26 MED ORDER — FUROSEMIDE 20 MG PO TABS: 20.0000 mg | ORAL_TABLET | Freq: Every day | ORAL | Status: AC

## 2023-12-26 MED ORDER — POLYETHYLENE GLYCOL 3350 17 G PO PACK: 17.0000 g | PACK | Freq: Two times a day (BID) | ORAL | Status: AC

## 2023-12-26 MED ORDER — BISACODYL 5 MG PO TBEC: 5.0000 mg | DELAYED_RELEASE_TABLET | Freq: Every day | ORAL | Status: AC | PRN

## 2023-12-26 MED ORDER — IBUPROFEN 200 MG PO TABS: 200.0000 mg | ORAL_TABLET | Freq: Four times a day (QID) | ORAL | Status: AC | PRN

## 2023-12-26 MED ORDER — IBUPROFEN 400 MG PO TABS: 200.0000 mg | ORAL_TABLET | Freq: Four times a day (QID) | ORAL | Status: DC | PRN

## 2023-12-26 MED ORDER — HYDROCODONE-ACETAMINOPHEN 5-325 MG PO TABS: 1.0000 | ORAL_TABLET | ORAL | 0 refills | Status: AC | PRN

## 2023-12-26 MED ORDER — SPIRONOLACTONE 25 MG PO TABS: 12.5000 mg | ORAL_TABLET | Freq: Every day | ORAL | Status: AC

## 2023-12-26 MED ORDER — ACETAMINOPHEN 325 MG PO TABS: 650.0000 mg | ORAL_TABLET | Freq: Four times a day (QID) | ORAL | Status: DC | PRN

## 2023-12-26 MED ORDER — DOCUSATE SODIUM 100 MG PO CAPS: 100.0000 mg | ORAL_CAPSULE | Freq: Two times a day (BID) | ORAL | Status: AC

## 2023-12-26 NOTE — Plan of Care (Signed)
  Problem: Education: Goal: Knowledge of General Education information will improve Description: Including pain rating scale, medication(s)/side effects and non-pharmacologic comfort measures Outcome: Progressing   Problem: Health Behavior/Discharge Planning: Goal: Ability to manage health-related needs will improve Outcome: Progressing   Problem: Clinical Measurements: Goal: Will remain free from infection Outcome: Progressing   Problem: Activity: Goal: Risk for activity intolerance will decrease Outcome: Not Progressing

## 2023-12-26 NOTE — Discharge Instructions (Addendum)
  Ramond Marrow MD, MPH Alfonse Alpers, PA-C [facility] [address], [address] [phone] (tel)   [phone] (fax)   POST-OPERATIVE INSTRUCTIONS - HIP  WOUND CARE You may remove the Operative Dressing on Post-Op Day #5 Leave steri strips in place.   If you feel more comfortable with it you can leave all dressings in place till your 1 week follow-up appointment.   KEEP THE INCISIONS CLEAN AND DRY. You may change/reinforce the bandage as needed.  Use the Cryocuff, GameReady or Ice as often as possible for the first 3-4 days, then as needed for pain relief. Always keep a towel, ACE wrap or other barrier between the cooling unit and your skin.  Once the incision is completely dry and without drainage, it may be left open to air out. Showering may begin 36-48 hours later.  Cleaning gently with soap and water. Do not soak the hip in water.  Do not go swimming in the pool or ocean until 4 weeks after surgery or when otherwise instructed.  EXERCISES - Follow the instructions of your therapist.  No specific exercises necessary outside of this. - You may bear weight on your operative leg. - Please continue to ambulate and do not stay sitting or lying for too long. Perform foot and wrist pumps to assist in circulation.  POST-OP MEDICATIONS- Multimodal approach to pain control In general your pain will be controlled with a combination of substances.  Prescriptions unless otherwise discussed are electronically sent to your pharmacy.  This is a carefully made plan we use to minimize narcotic use.      Acetaminophen - Non-narcotic pain medicine taken on a scheduled basis  Oxycodone - This is a strong narcotic, to be used only on an "as needed" basis for pain.   FOLLOW-UP - If you develop a Fever (>101.5), Redness or Drainage from the surgical incision site, please call our office to arrange for an evaluation. - Please call the office to schedule a follow-up  appointment for your incision check, 10-14 days post-operatively.  IF YOU HAVE ANY QUESTIONS, PLEASE FEEL FREE TO CALL OUR OFFICE.  HELPFUL INFORMATION  You should wean off your narcotic medicines as soon as you are able.  Most patients will be off or using minimal narcotics before their first postop appointment.   We suggest you use the pain medication the first night prior to going to bed, in order to ease any pain when the anesthesia wears off. You should avoid taking pain medications on an empty stomach as it will make you nauseous.  Do not drink alcoholic beverages or take illicit drugs when taking pain medications.  Pain medication may make you constipated.  Below are a few solutions to try in this order: Decrease the amount of pain medication if you aren't having pain. Drink lots of decaffeinated fluids. Drink prune juice and/or each dried prunes  If the first 3 don't work start with additional solutions Take Colace - an over-the-counter stool softener Take Senokot - an over-the-counter laxative Take Miralax - a stronger over-the-counter laxative  For more information including helpful videos and documents visit our website:   [email or web address]

## 2023-12-26 NOTE — TOC Transition Note (Signed)
Transition of Care [facility]) - Discharge Note   Patient Details  Name: [name] MRN: [id number] Date of Birth: [birth year]  [facility]) CM/SW Contact:  Carley Hammed, LCSW Phone Number: 12/26/2023, 11:52 AM   Clinical Narrative:    Pt to be transported to [facility] via [facility]. Nurse to call report to [phone].   Final next level of care: Skilled Nursing Facility Barriers to Discharge: Barriers Resolved   Patient Goals and CMS Choice Patient states their goals for this hospitalization and ongoing recovery are:: Pt wants to be able to regain independence. CMS Medicare.gov Compare Post Acute Care list provided to:: Patient Choice offered to / list presented to : Patient      Discharge Placement              Patient chooses bed at: [facility] [place] Patient to be transferred to facility by: [facility] Name of family member notified: Eber Jones Patient and family notified of of transfer: 12/26/23  Discharge Plan and Services Additional resources added to the After Visit Summary for   In-house Referral: Clinical Social Work   Post Acute Care Choice: Skilled Nursing Facility                               Social Drivers of Health (SDOH) Interventions SDOH Screenings   Food Insecurity: No Food Insecurity (12/21/2023)  Housing: Low Risk  (12/24/2023)  Transportation Needs: No Transportation Needs (12/21/2023)  Utilities: Not At Risk (12/21/2023)  Social Connections: Unknown (12/24/2023)  Tobacco Use: Low Risk  (12/21/2023)     Readmission Risk Interventions     No data to display

## 2023-12-26 NOTE — Care Management Important Message (Signed)
Important Message  Patient Details  Name: [name] MRN: [id number] Date of Birth: [birth year]   Important Message Given:  Yes - Medicare IM     Renie Ora 12/26/2023, 12:12 PM

## 2024-01-02 ENCOUNTER — Telehealth: Payer: Self-pay | Admitting: Family

## 2024-01-02 NOTE — Telephone Encounter (Signed)
 Returned call to pt sister ( ok per DPR),   She states she fell at home and fractured her left hip and pelvic area, and it was repaired. She states they sent her to rehab at [facility]. She choose [facility], now as of this morning she got a call that the pt will not take her medication because she did not want to go to therapy. She was told that if she does not go to therapy that insurance will not pay. She is refusing to eat has now dropped down to 90 pounds. She may or may not still be on oxygen but not being compliant with wearing it. She states that she wants to go into hospice care. She states that she wants pain medication but nothing else. She has to be recommended by a physician and she thinks her heart daughter would be the one to do this. She wants inpatient. She cannot stand on her feet or sit in a chair, no urinary or bowel control.   [facility]  [facility]   Sister will give patient information on the above mentioned facilities and will have conversation with skilled facility provider on hospice orders.

## 2024-01-02 NOTE — Telephone Encounter (Signed)
 Pt's sister is requesting cb to discuss a referral for inpatient hospice due to pt's health declining

## 2024-01-20 ENCOUNTER — Ambulatory Visit (INDEPENDENT_AMBULATORY_CARE_PROVIDER_SITE_OTHER): Payer: Medicare Other | Admitting: Podiatry

## 2024-01-20 DIAGNOSIS — Z91199 Patient's noncompliance with other medical treatment and regimen due to unspecified reason: Secondary | ICD-10-CM

## 2024-01-20 NOTE — Progress Notes (Signed)
 No show

## 2024-01-21 ENCOUNTER — Ambulatory Visit (HOSPITAL_BASED_OUTPATIENT_CLINIC_OR_DEPARTMENT_OTHER): Payer: Medicare Other | Admitting: Cardiology

## 2024-08-21 DIAGNOSIS — L89152 Pressure ulcer of sacral region, stage 2: Secondary | ICD-10-CM | POA: Diagnosis not present

## 2024-08-21 DIAGNOSIS — R627 Adult failure to thrive: Secondary | ICD-10-CM | POA: Diagnosis not present

## 2024-08-21 DIAGNOSIS — F331 Major depressive disorder, recurrent, moderate: Secondary | ICD-10-CM | POA: Diagnosis not present

## 2024-08-21 DIAGNOSIS — I480 Paroxysmal atrial fibrillation: Secondary | ICD-10-CM | POA: Diagnosis not present

## 2024-09-09 DIAGNOSIS — M4628 Osteomyelitis of vertebra, sacral and sacrococcygeal region: Secondary | ICD-10-CM | POA: Diagnosis not present
